# Patient Record
Sex: Male | Born: 1935 | Race: White | Hispanic: No | Marital: Married | Smoking: Former smoker
Health system: Southern US, Community
[De-identification: ages and names within clinical notes are randomized; demographics above are authoritative.]

## PROBLEM LIST (undated history)

## (undated) ENCOUNTER — Emergency Department (HOSPITAL_BASED_OUTPATIENT_CLINIC_OR_DEPARTMENT_OTHER): Admission: EM | Payer: Medicare Other | Source: Home / Self Care

## (undated) DIAGNOSIS — I272 Pulmonary hypertension, unspecified: Secondary | ICD-10-CM

## (undated) DIAGNOSIS — W44F3XA Food entering into or through a natural orifice, initial encounter: Secondary | ICD-10-CM

## (undated) DIAGNOSIS — I10 Essential (primary) hypertension: Secondary | ICD-10-CM

## (undated) DIAGNOSIS — K222 Esophageal obstruction: Secondary | ICD-10-CM

## (undated) DIAGNOSIS — I639 Cerebral infarction, unspecified: Secondary | ICD-10-CM

## (undated) DIAGNOSIS — I482 Chronic atrial fibrillation, unspecified: Secondary | ICD-10-CM

## (undated) DIAGNOSIS — T18128A Food in esophagus causing other injury, initial encounter: Secondary | ICD-10-CM

## (undated) DIAGNOSIS — I255 Ischemic cardiomyopathy: Secondary | ICD-10-CM

## (undated) DIAGNOSIS — J9600 Acute respiratory failure, unspecified whether with hypoxia or hypercapnia: Secondary | ICD-10-CM

## (undated) DIAGNOSIS — E785 Hyperlipidemia, unspecified: Secondary | ICD-10-CM

## (undated) DIAGNOSIS — I2510 Atherosclerotic heart disease of native coronary artery without angina pectoris: Secondary | ICD-10-CM

## (undated) DIAGNOSIS — R55 Syncope and collapse: Secondary | ICD-10-CM

## (undated) DIAGNOSIS — K219 Gastro-esophageal reflux disease without esophagitis: Secondary | ICD-10-CM

## (undated) DIAGNOSIS — I5022 Chronic systolic (congestive) heart failure: Secondary | ICD-10-CM

## (undated) DIAGNOSIS — D735 Infarction of spleen: Secondary | ICD-10-CM

## (undated) DIAGNOSIS — N200 Calculus of kidney: Secondary | ICD-10-CM

## (undated) DIAGNOSIS — M1990 Unspecified osteoarthritis, unspecified site: Secondary | ICD-10-CM

## (undated) DIAGNOSIS — R011 Cardiac murmur, unspecified: Secondary | ICD-10-CM

## (undated) DIAGNOSIS — K3533 Acute appendicitis with perforation and localized peritonitis, with abscess: Secondary | ICD-10-CM

## (undated) DIAGNOSIS — I495 Sick sinus syndrome: Secondary | ICD-10-CM

## (undated) DIAGNOSIS — N280 Ischemia and infarction of kidney: Secondary | ICD-10-CM

## (undated) HISTORY — DX: Essential (primary) hypertension: I10

## (undated) HISTORY — DX: Cardiac murmur, unspecified: R01.1

## (undated) HISTORY — DX: Sick sinus syndrome: I49.5

## (undated) HISTORY — DX: Chronic atrial fibrillation, unspecified: I48.20

## (undated) HISTORY — DX: Ischemia and infarction of kidney: N28.0

## (undated) HISTORY — DX: Chronic systolic (congestive) heart failure: I50.22

## (undated) HISTORY — DX: Unspecified osteoarthritis, unspecified site: M19.90

## (undated) HISTORY — DX: Food in esophagus causing other injury, initial encounter: T18.128A

## (undated) HISTORY — DX: Food entering into or through a natural orifice, initial encounter: W44.F3XA

## (undated) HISTORY — PX: LITHOTRIPSY: SUR834

## (undated) HISTORY — DX: Ischemic cardiomyopathy: I25.5

## (undated) HISTORY — DX: Hyperlipidemia, unspecified: E78.5

## (undated) HISTORY — DX: Pulmonary hypertension, unspecified: I27.20

---

## 1997-08-16 ENCOUNTER — Ambulatory Visit (HOSPITAL_COMMUNITY): Admission: RE | Admit: 1997-08-16 | Discharge: 1997-08-16 | Payer: Self-pay | Admitting: Urology

## 2001-03-16 DIAGNOSIS — I2510 Atherosclerotic heart disease of native coronary artery without angina pectoris: Secondary | ICD-10-CM

## 2001-03-16 HISTORY — DX: Atherosclerotic heart disease of native coronary artery without angina pectoris: I25.10

## 2002-02-23 ENCOUNTER — Inpatient Hospital Stay (HOSPITAL_COMMUNITY): Admission: AD | Admit: 2002-02-23 | Discharge: 2002-03-06 | Payer: Self-pay | Admitting: Cardiology

## 2002-02-24 ENCOUNTER — Encounter: Payer: Self-pay | Admitting: Cardiology

## 2002-02-24 HISTORY — PX: CARDIAC CATHETERIZATION: SHX172

## 2002-02-27 ENCOUNTER — Encounter: Payer: Self-pay | Admitting: Cardiology

## 2002-02-28 ENCOUNTER — Encounter (INDEPENDENT_AMBULATORY_CARE_PROVIDER_SITE_OTHER): Payer: Self-pay | Admitting: Cardiovascular Disease

## 2002-03-01 ENCOUNTER — Encounter: Payer: Self-pay | Admitting: Cardiothoracic Surgery

## 2002-03-01 HISTORY — PX: CORONARY ARTERY BYPASS GRAFT: SHX141

## 2002-03-02 ENCOUNTER — Encounter: Payer: Self-pay | Admitting: Cardiothoracic Surgery

## 2002-03-03 ENCOUNTER — Encounter: Payer: Self-pay | Admitting: Cardiothoracic Surgery

## 2002-03-04 ENCOUNTER — Encounter: Payer: Self-pay | Admitting: Cardiothoracic Surgery

## 2002-03-05 ENCOUNTER — Encounter: Payer: Self-pay | Admitting: Thoracic Surgery (Cardiothoracic Vascular Surgery)

## 2002-03-15 ENCOUNTER — Emergency Department (HOSPITAL_COMMUNITY): Admission: EM | Admit: 2002-03-15 | Discharge: 2002-03-15 | Payer: Self-pay

## 2002-03-30 ENCOUNTER — Encounter
Admission: RE | Admit: 2002-03-30 | Discharge: 2002-03-30 | Payer: Self-pay | Admitting: Thoracic Surgery (Cardiothoracic Vascular Surgery)

## 2002-03-30 ENCOUNTER — Encounter: Payer: Self-pay | Admitting: Thoracic Surgery (Cardiothoracic Vascular Surgery)

## 2002-04-24 ENCOUNTER — Ambulatory Visit (HOSPITAL_COMMUNITY): Admission: RE | Admit: 2002-04-24 | Discharge: 2002-04-24 | Payer: Self-pay | Admitting: Cardiology

## 2002-08-17 ENCOUNTER — Emergency Department (HOSPITAL_COMMUNITY): Admission: EM | Admit: 2002-08-17 | Discharge: 2002-08-18 | Payer: Self-pay | Admitting: Emergency Medicine

## 2002-08-18 ENCOUNTER — Encounter: Payer: Self-pay | Admitting: Emergency Medicine

## 2004-02-17 ENCOUNTER — Inpatient Hospital Stay (HOSPITAL_COMMUNITY): Admission: EM | Admit: 2004-02-17 | Discharge: 2004-02-20 | Payer: Self-pay | Admitting: Emergency Medicine

## 2004-02-17 ENCOUNTER — Ambulatory Visit: Payer: Self-pay | Admitting: Physical Medicine & Rehabilitation

## 2004-02-18 ENCOUNTER — Encounter (INDEPENDENT_AMBULATORY_CARE_PROVIDER_SITE_OTHER): Payer: Self-pay | Admitting: *Deleted

## 2004-02-20 ENCOUNTER — Inpatient Hospital Stay (HOSPITAL_COMMUNITY)
Admission: RE | Admit: 2004-02-20 | Discharge: 2004-02-25 | Payer: Self-pay | Admitting: Physical Medicine & Rehabilitation

## 2004-04-15 ENCOUNTER — Encounter
Admission: RE | Admit: 2004-04-15 | Discharge: 2004-07-14 | Payer: Self-pay | Admitting: Physical Medicine & Rehabilitation

## 2004-04-17 ENCOUNTER — Ambulatory Visit: Payer: Self-pay | Admitting: Physical Medicine & Rehabilitation

## 2005-03-16 DIAGNOSIS — I639 Cerebral infarction, unspecified: Secondary | ICD-10-CM

## 2005-03-16 HISTORY — DX: Cerebral infarction, unspecified: I63.9

## 2006-07-04 ENCOUNTER — Inpatient Hospital Stay (HOSPITAL_COMMUNITY): Admission: EM | Admit: 2006-07-04 | Discharge: 2006-07-06 | Payer: Self-pay | Admitting: Emergency Medicine

## 2006-07-05 HISTORY — PX: CORONARY ANGIOPLASTY: SHX604

## 2006-09-04 ENCOUNTER — Emergency Department (HOSPITAL_COMMUNITY): Admission: EM | Admit: 2006-09-04 | Discharge: 2006-09-04 | Payer: Self-pay | Admitting: Emergency Medicine

## 2006-09-13 ENCOUNTER — Ambulatory Visit: Payer: Self-pay | Admitting: Internal Medicine

## 2008-11-27 ENCOUNTER — Inpatient Hospital Stay (HOSPITAL_COMMUNITY): Admission: EM | Admit: 2008-11-27 | Discharge: 2008-11-29 | Payer: Self-pay | Admitting: Emergency Medicine

## 2009-05-09 ENCOUNTER — Inpatient Hospital Stay (HOSPITAL_COMMUNITY): Admission: EM | Admit: 2009-05-09 | Discharge: 2009-05-10 | Payer: Self-pay | Admitting: Emergency Medicine

## 2009-05-14 HISTORY — PX: NM MYOCAR PERF WALL MOTION: HXRAD629

## 2010-06-04 LAB — POCT I-STAT, CHEM 8
BUN: 15 mg/dL (ref 6–23)
Calcium, Ion: 1.12 mmol/L (ref 1.12–1.32)
Chloride: 101 mEq/L (ref 96–112)
Creatinine, Ser: 1 mg/dL (ref 0.4–1.5)
Glucose, Bld: 111 mg/dL — ABNORMAL HIGH (ref 70–99)
HCT: 42 % (ref 39.0–52.0)
Hemoglobin: 14.3 g/dL (ref 13.0–17.0)
Potassium: 4.1 mEq/L (ref 3.5–5.1)
Sodium: 138 mEq/L (ref 135–145)
TCO2: 34 mmol/L (ref 0–100)

## 2010-06-04 LAB — HEPATIC FUNCTION PANEL
AST: 19 U/L (ref 0–37)
Albumin: 3 g/dL — ABNORMAL LOW (ref 3.5–5.2)
Bilirubin, Direct: 0.2 mg/dL (ref 0.0–0.3)
Total Protein: 6.1 g/dL (ref 6.0–8.3)

## 2010-06-04 LAB — MAGNESIUM: Magnesium: 1.9 mg/dL (ref 1.5–2.5)

## 2010-06-04 LAB — POCT CARDIAC MARKERS
CKMB, poc: <1 ng/mL — ABNORMAL LOW (ref 1.0–8.0)
Myoglobin, poc: 79.2 ng/mL (ref 12–200)
Troponin i, poc: <0.05 ng/mL (ref 0.00–0.09)

## 2010-06-04 LAB — PROTIME-INR
INR: 1.06 (ref 0.00–1.49)
Prothrombin Time: 13.7 seconds (ref 11.6–15.2)

## 2010-06-04 LAB — CK TOTAL AND CKMB (NOT AT ARMC)
CK, MB: 1.3 ng/mL (ref 0.3–4.0)
Relative Index: INVALID (ref 0.0–2.5)
Total CK: 32 U/L (ref 7–232)

## 2010-06-04 LAB — APTT: aPTT: 52 seconds — ABNORMAL HIGH (ref 24–37)

## 2010-06-04 LAB — TROPONIN I: Troponin I: 0.03 ng/mL (ref 0.00–0.06)

## 2010-06-04 LAB — HEMOGLOBIN A1C
Hgb A1c MFr Bld: 6 % (ref 4.6–6.1)
Mean Plasma Glucose: 126 mg/dL

## 2010-06-04 LAB — CARDIAC PANEL(CRET KIN+CKTOT+MB+TROPI)
CK, MB: 1.3 ng/mL (ref 0.3–4.0)
Relative Index: INVALID (ref 0.0–2.5)
Total CK: 30 U/L (ref 7–232)

## 2010-06-04 LAB — TSH: TSH: 3.469 u[IU]/mL (ref 0.350–4.500)

## 2010-06-04 LAB — BRAIN NATRIURETIC PEPTIDE: Pro B Natriuretic peptide (BNP): 375 pg/mL — ABNORMAL HIGH (ref 0.0–100.0)

## 2010-06-20 LAB — BASIC METABOLIC PANEL
CO2: 33 mEq/L — ABNORMAL HIGH (ref 19–32)
Calcium: 8.9 mg/dL (ref 8.4–10.5)
Chloride: 100 mEq/L (ref 96–112)
Creatinine, Ser: 1.2 mg/dL (ref 0.4–1.5)
GFR calc Af Amer: >60 mL/min (ref >60)
GFR calc non Af Amer: >60 mL/min (ref >60)
Glucose, Bld: 97 mg/dL (ref 70–99)
Sodium: 136 mEq/L (ref 135–145)

## 2010-06-20 LAB — BRAIN NATRIURETIC PEPTIDE: Pro B Natriuretic peptide (BNP): 1027 pg/mL — ABNORMAL HIGH (ref 0.0–100.0)

## 2010-06-20 LAB — LIPID PANEL
HDL: 39 mg/dL — ABNORMAL LOW (ref >39)
LDL Cholesterol: 154 mg/dL — ABNORMAL HIGH (ref 0–99)
Total CHOL/HDL Ratio: 5.3 RATIO
VLDL: 15 mg/dL (ref 0–40)

## 2010-06-20 LAB — CBC
HCT: 36.4 % — ABNORMAL LOW (ref 39.0–52.0)
Hemoglobin: 12.5 g/dL — ABNORMAL LOW (ref 13.0–17.0)
Platelets: 232 10*3/uL (ref 150–400)
Platelets: 218 10*3/uL (ref 150–400)
RDW: 14.6 % (ref 11.5–15.5)
WBC: 7.7 10*3/uL (ref 4.0–10.5)
WBC: 8.8 10*3/uL (ref 4.0–10.5)

## 2010-06-20 LAB — DIFFERENTIAL
Eosinophils Relative: 1 % (ref 0–5)
Lymphocytes Relative: 13 % (ref 12–46)
Lymphs Abs: 1.2 10*3/uL (ref 0.7–4.0)
Monocytes Absolute: 0.7 10*3/uL (ref 0.1–1.0)

## 2010-06-20 LAB — COMPREHENSIVE METABOLIC PANEL
AST: 18 U/L (ref 0–37)
Albumin: 3.2 g/dL — ABNORMAL LOW (ref 3.5–5.2)
Calcium: 8.3 mg/dL — ABNORMAL LOW (ref 8.4–10.5)
Creatinine, Ser: 0.8 mg/dL (ref 0.4–1.5)
GFR calc Af Amer: >60 mL/min (ref >60)

## 2010-06-20 LAB — CK TOTAL AND CKMB (NOT AT ARMC)
CK, MB: 1.6 ng/mL (ref 0.3–4.0)
Relative Index: INVALID (ref 0.0–2.5)
Relative Index: INVALID (ref 0.0–2.5)
Total CK: 54 U/L (ref 7–232)

## 2010-06-20 LAB — MAGNESIUM: Magnesium: 2 mg/dL (ref 1.5–2.5)

## 2010-06-20 LAB — TROPONIN I: Troponin I: 0.03 ng/mL (ref 0.00–0.06)

## 2010-08-01 NOTE — Discharge Summary (Signed)
[name], [name] NO.:  [id number]   MEDICAL RECORD NO.:  [id number]          PATIENT TYPE:  IPS   LOCATION:  [id number]                         FACILITY:  [facility]   PHYSICIAN:  Gregory [name], M.D.   DATE OF BIRTH:  06-06-1932   DATE OF ADMISSION:  02/20/2004  DATE OF DISCHARGE:  02/25/2004                                 DISCHARGE SUMMARY   DISCHARGE DIAGNOSES:  1.  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  2.  Restless leg syndrome.  3.  History of atrial fibrillation.  4.  History of congestive heart failure.  5.  History of hypertension.  6.  History of elevated cholesterol.  7.  Urinary tract infection.  8.  History of hypertension.   HISTORY AND PHYSICAL:  The patient is a 77 year old right-handed while male  with past medical history of atrial fibrillation, chronic Coumadin, obesity,  admitted in [place] on February 16, 2004 after speech changes and left-  sided weakness.  Head CT revealed no acute intracranial abnormalities.  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  Coumadin for CVA prophylaxis.  INR on admission was 1.3.  PT report at this  time indicates patient is ambulating -50 feet, min amount assist.  Transfer  min assist.  _____.  Echocardiogram revealed the patient had an ejection  fraction of 30%, diffuse ventricular hypokinesis.  Carotid Dopplers did not  detect evidence of ICA stenosis.  The patient is transferred to [facility] Department on February 20, 2004.   REVIEW OF SYSTEMS:  Negative for shortness of breath, chest pain, weakness  and anxiety.   PAST HISTORY:  Significant for mitral valve regurgitation, atrial  fibrillation, cardiovascular disease, CHF, elevated lipids, severe restless  syndrome, renal calculi, GERD, hypertension.   PAST SURGICAL HISTORY:  CABG.   SOCIAL HISTORY:  The patient lives with wife in one level home in  [place], [place].  Two to  three step entry.  Supportive family,  eight children.  Remote smoker.  Denies any alcohol.  He is a Horticulturist, commercial.   MEDS ON ADMISSION:  1.  Zetia 10 mg daily.  2. Protonix 40 mg daily.  3. Altace 5 mg p.o. q.h.s.      4. Coreg 12.5 mg p.o. b.i.d.  5. Coumadin 5  mg Monday, Wednesday, Friday, 2.5 mg every other day.   ALLERGIES:  None.   HOSPITAL COURSE:  Problem #1. Gregory [name] was admitted to the _____  department on February 20, 2004.  _____ more than three hours of therapy  daily.  Overall, [name] progressed very well from a physical therapy  standpoint.  Discharged at _____ level.  The patient remained on Coumadin  for DVT and CVA prophylaxis.  The patient had no new neurologic event  occurred while in rehab.  The patient occasionally complained of muscle  aches in his shoulder and restless leg syndrome.  The patient was started on  Mirapex 0.25 mg p.o. q.h.s. for restless leg syndrome.  From a rehab  standpoint, the patient was able  to cooperate very well and discharged at  _____ level.   Problem #2. Elevation of cholesterol.  Remained on Zetia for patient's  cholesterol.  Liver function test remains stable.   Problem #3. Chronic atrial fibrillation.  Presently patient's heart rate was  controlled but irregular.  The patient is presently on no medicines.   Problem #4. Urinary tract infection.  At time of admission the patient had  urine culture performed on February 21, 2004 which demonstrated greater than  100,000 colonies Citrobacter koseri.  He was started on Macrobid 100 mg p.o.  b.i.d. for a total of 5 days.   Problem #5. Restless leg syndrome.  The patient remained on Mirapex 0.25 mg  p.o. q.h.s.   Problem #6. History of hypertension.  Blood pressure remained in good  control while in rehab.  He continued to take Altace 5 mg daily, Coreg 12.5  mg daily, and Lasix 20 mg daily.   Problem #7.  Muscle aches.  The patient received _____ as needed for right   shoulder and Tylenol as needed for pain.   No other major issues occurred while patient was in rehab.  The patient is  ambulating approximately supervision level 100 feet with SVQC transfers,  _____ he will be able to perform most ADLs, with minimal assist supervision  level.  Comprehension within functional limits.  The patient is able to  perform most ADLs with supervision level.  The patient is discharged home to  his family, discharge.   LATEST LABS:  Hemoglobin 14.2, hematocrit 41.1, white blood cell count 13.4,  platelet count 215.  Sodium 136, potassium 3.4, chloride 97, BUN 16,  creatinine 1.1. AST 25, alkaline phosphatase 71.  Urinalysis performed on  12/08 demonstrate many bacteria and moderate leukocytes.  The patient will  have _____ and CBC performed before he was discharged.  It is pending at the  time of this dictation.   Note at time of discharge all vitals were stable.  The patient was  discharged home to his family.   DISCHARGE MEDICATIONS:  1.  Mirapex 0.25 mg p.o. q.h.s.  2. Maxzide one tablet p.o. daily.  3.      Altace 5 mg daily.  4. Zetia 10 mg daily.  5. Coreg 12.5 mg daily.      Protonix 40 mg daily.  7. Lasix 20 mg daily.  8. Zocor 40 mg daily.  9.      Coumadin 5 mg except for 2.5 mg.  10. Trazodone 25-50 mg p.o. q.h.s.      p.r.n.  11. Multivitamin p.o. daily.   ACTIVITY:  No driving, no using alcohol, use cane.  No smoking.  _____  April 17, 2004 at 12:00 p.m.  Call Dr. [name] for this week.  Home health  with PT and OT and Coumadin results to Dr. [name].       LB/MEDQ  D:  02/25/2004  T:  02/25/2004  Job:  [id number]   cc:   [name]. [name], [place] [address]  [place] [address]  Fax: [phone]

## 2010-08-01 NOTE — Discharge Summary (Signed)
NAME:  Gregory [name], Gregory [name] NO.:  [id number]   MEDICAL RECORD NO.:  [id number]          PATIENT TYPE:  INP   LOCATION:  3703                         FACILITY:  [facility]   PHYSICIAN:  [name]. [name], MD       DATE OF BIRTH:  [birth year]   DATE OF ADMISSION:  07/04/2006  DATE OF DISCHARGE:  07/06/2006                               DISCHARGE SUMMARY   DISCHARGE DIAGNOSES:  1. Chest pain, probably non-cardiac in origin with patent graft by      catheterization this admission.  2. Coronary artery bypass grafting in December of 2003.  3. Chronic atrial fibrillation.  Not on Coumadin prior to admission.  4. Dyslipidemia.  5. Cardiomyopathy by catheterization this admission with an EF of 25%.   HOSPITAL COURSE:  The patient is a 77 year old male followed by Dr.  [name] in the past.  He had bypass surgery in December of 2003.  He had  atrial fibrillation which is now chronic.  He is not on Coumadin.  Prior  to admission, his only medicines were aspirin p.r.n.  The patient was  admitted to telemetry after presenting July 04, 2006 with shoulder pain  and chest pain.  The patient did agree to resuming some medications, he  preferred to do it one at a time since he has had problems with other  medicines side effects in the past.  We started him on Vytorin 10/20 on  admission.  He was cathed July 05, 2006 by Dr. Tami Ribas.  Catheterization revealed a patent vein graft to his PDA, patent SVG to  the OM, and a patent LIMA to the LAD, with an EF of 20 to 25%.  ACE  inhibitor and Lanoxin were added, as well as Coreg and aspirin.  He will  follow up with Dr. [name] as an outpatient.   DISCHARGE MEDICATIONS:  1. Lotensin 2.5 mg a day.  2. Lanoxin 0.125 mg a day.  3. Coreg 3.125 b.i.d.  4. Vytorin 10/20 every day.  5. Coated aspirin every day.  6. Nitroglycerin sublingual p.r.n.   LABORATORY DATA:  White count 7.1, hemoglobin 15.2, hematocrit 44.2,  platelets 211,000.  Sodium 137,  potassium 4.5, BUN 11, creatinine 1.  LFTs are normal.  CK and troponins were negative.  Cholesterol was 235  with a LDL of 168, HDL 55.  TSH 2.49.  Chest x-ray showed cardiomegaly,  low volumes, no edema.  UA was negative.  INR is 1.   DISPOSITION:  The patient is discharged in stable condition and will  follow up with Dr. [name].   ADDENDUM TO DISCHARGE SUMMARY:  EKG shows atrial fibrillation with  controlled ventricular response and poor anterior R wave progression.      Erlene Quan, P.A.      [name]. [name], MD  Electronically Signed    LKK/MEDQ  D:  07/06/2006  T:  07/06/2006  Job:  [id number]

## 2010-08-01 NOTE — Op Note (Signed)
NAME:  Gregory [name], Gregory [name]                         ACCOUNT NO.:  [id number]   MEDICAL RECORD NO.:  [id number]                   PATIENT TYPE:  INP   LOCATION:  2306                                 FACILITY:  [facility]   PHYSICIAN:  Lilia Argue. Servando Snare, M.D.            DATE OF BIRTH:  02-06-1933   DATE OF PROCEDURE:  03/01/2002  DATE OF DISCHARGE:                                 OPERATIVE REPORT   PREOPERATIVE DIAGNOSIS:  Coronary occlusive disease with severe left  ventricular dysfunction.   POSTOPERATIVE DIAGNOSIS:  Coronary occlusive disease with severe left  ventricular dysfunction.   PROCEDURES:  1. Coronary artery bypass grafting x4 with left internal mammary artery to     the left anterior descending coronary artery, reversed saphenous vein     graft to the obtuse marginal coronary artery, sequential reversed     saphenous vein graft to the posterior descending and posterolateral     branches of the right coronary artery, and ligation of the left atrial     appendage.  2. Right thigh endovein harvesting.   SURGEON:  Lilia Argue. Servando Snare, M.D.   ASSISTANTS:  Marcellus Scott, P.A.-C., and Sander Radon, M.D.   BRIEF HISTORY:  The patient is a 77 year old male who presents with a  history of coronary occlusive disease, having had angioplasty 10 years  prior.  He presented with new onset of congestive heart failure symptoms.  Evaluation with cardiac catheterization revealed significant three-vessel  coronary artery disease with total occlusion of the LAD, total occlusion of  the circumflex, an ostial right coronary lesion of 80%, and disease of the  posterior descending and posterolateral branches of 70-80%.  Ejection  fraction was 10-15% with mild pulmonary hypertension.  Echocardiogram showed  mild mitral regurgitation.  The patient after admission was noted to be in  atrial flutter of unknown duration.  In spite of increased risk of surgery  because of his poor LV function, it was felt  that bypass surgery provided  the best chance for the patient's overall improvement.  Risks and options  were discussed with him and his family in detail.   DESCRIPTION OF PROCEDURE:  With Swan-Ganz and arterial line monitors in  place, the patient underwent general endotracheal anesthesia without  incident.  A transesophageal echo probe was placed and observations noted  under separate dictation.  No definite left atrial clot was noted, but in  the very tip of the atrial appendage it was hard to discern definitely.  Overall global function was as noted preop markedly depressed.  There was  trivial to mild mitral regurgitation even with elevated filling pressures  and arterial pressure.  The skin of the chest and legs was prepped with  Betadine and draped in the usual sterile manner.  Using the [employer] endovein  harvesting system, segments of vein were harvested from the right thigh and  were of good quality and caliber.  Median sternotomy  was performed, and the  left internal mammary artery was dissected down as a pedicle graft.  The  distal artery was divided and had good, free flow.  The pericardium was  opened.  The patient had evidence of significant LV dysfunction, global  hypokinesis, and biventricular enlargement.  He was systemically  heparinized.  The ascending aorta and the right atrium were cannulated and  the aortic root vent cardioplegia needle was introduced into the ascending  aorta.  The patient was placed on cardiopulmonary bypass, 2.4 L/min. per sq.  m.  Sites of anastomosis were selected and dissected out of the epicardium.  The patient's body temperature was cooled to 30 degrees, the aortic  crossclamp was applied, and 500 cc of cold blood potassium cardioplegia was  administered with rapid diastolic arrest of the heart.  Myocardial septal  temperature was monitored throughout the crossclamp period.  Attention was  turned first to the obtuse marginal coronary artery,  which was opened.  It  was a small vessel but admitted a 1.5 mm probe.  Using a running 7-0  Prolene, distal anastomosis was performed.  Attention was then turned to the  posterior descending artery, which was opened and admitted a 1.5 mm probe.  Using a diamond-type, side-to-side anastomosis was carried out.  The distal  extent of the same vein was then carried onto the posterolateral branch of  the right coronary artery, which was slightly smaller but admitted a 1.5 mm  probe.  Using a running 7-0 Prolene, anastomosis was carried out.  Attention  was then carried to the left anterior descending coronary artery, which was  opened and was a reasonable-sized vessel, admitting a 1.5 mm probe distally.  Using a running 8-0 Prolene, the left internal mammary artery was  anastomosed to the left anterior descending coronary artery.  While the  crossclamp was on, the left atrial appendage was doubly ligated to decrease  the risk of postoperative clots.  Because of the patient's severe LV  dysfunction, it was decided not to perform a concomitant maze procedure.  The crossclamp was removed, crossclamp time of 76 minutes.  The partial  occlusion clamp was placed on the ascending aorta.  Two punch aortotomies  were performed and each of the two vein grafts were anastomosed to the  ascending aorta.  Air was evacuated from the grafts and the partial  occlusion clamp was removed.  The patient spontaneously converted to a sinus  rhythm without electrical conversion.  He was started on milrinone and  dopamine infusion.  With the body temperature rewarmed, he was then  ventilated and weaned from cardiopulmonary bypass without difficulty and  remained hemodynamically stable.  He was decannulated in the usual fashion.  Protamine sulfate was administered.  With the operative field hemostatic,  two atrial and two ventricular pacing wires were applied, graft markers were applied.  A left pleural tube and two  mediastinal tubes left in place.  The  sternum was closed with #6 stainless steel wire.  The fascia was closed with  interrupted 0 Vicryl, running 3-0 Vicryl in the subcutaneous tissue, and 4-0  subcuticular stitch in the skin.  Dry dressings were applied.  Sponge and  needle count was reported as correct at the completion of the procedure.  The patient tolerated the procedure without obvious complication and was  transferred to the surgical intensive care unit for further postoperative  care.  Lilia Argue Servando Snare, M.D.    EBG/MEDQ  D:  03/03/2002  T:  03/05/2002  Job:  [id number]   cc:   [name]. [name], M.D.  [address] [place]  [place] [address]  Fax: [phone]

## 2010-08-01 NOTE — Cardiovascular Report (Signed)
NAME:  Gregory [name], MAZZOTTI NO.:  [id number]   MEDICAL RECORD NO.:  [id number]          PATIENT TYPE:  INP   LOCATION:  3703                         FACILITY:  [facility]   PHYSICIAN:  Octavia Heir, MD  DATE OF BIRTH:  [birth year]   DATE OF PROCEDURE:  07/05/2006  DATE OF DISCHARGE:                            CARDIAC CATHETERIZATION   PROCEDURES:  1. Left heart catheterization.  2. Coronary angiography.  3. Left ventriculogram.  4. Left internal mammary angiography.  5. Saphenous vein graft.   COMPLICATIONS:  None.   INDICATION:  Mr. Flinchbaugh is a 77 year old male, patient of Dr. Adrian Prows,  with a history of chronic atrial fibrillation, history of ischemic  cardiomyopathy, history of cardiac catheterization in December 2003  subsequent to bypass surgery consisting of LIMA to LAD, vein graft to  OM, sequential vein graft to PD and posterolateral branch.  The patient  has been noncompliant with his medications.  He was admitted through the  ER on July 04, 2006 with reoccurring chest pain.  He is subsequently  ruled out for MI.  Because his symptoms were very similar to his  previous angiograms he is now brought for a cardiac catheterization to  reevaluated his grafts.   DESCRIPTION OF PROCEDURE:  After obtaining informed consent, the patient  was brought into the cardiac catheterization laboratory where the right  and left groin are shaved, prepped and draped in the usual sterile  fashion.  ECG monitoring was established.  Using modified Seldinger  technique, a #6 French arterial sheath was inserted in the femoral  artery.  The 6-French diagnostic catheter used to perform diagnostic  angiography.   The left main is a medium to large vessel with no significant disease.   The LAD is a medium sized vessel which has a 50% proximal lesion that is  subtotally occluded in its mid segment.  The mid distal LAD filled a  patent LIMA which inserts in the mid portion of the  LAD. There is no  significant disease in the IMA or distal IMA insertion.  This does  retrograde fill a large diagonal which has no significant disease.   The left circumflex is a small to medium sized vessel which is totally  occluded in its proximal segment up to confronting two small obtuse  marginal branch.   The first one is a small vessel with no significant disease.   The second one fills the patent saphenous vein graft.  The graft  insertion at mid portion of the limb.  There is no significant disease  in the body of the graft or the distal graft insertion.   The right coronary artery is a large vessel that is dominant consistent  with both PDA as well as posterolateral branch.  The RCA is narrowed at  60% proximal and 50% distal lesion.  There is a patent sequential vein  graft at PDA and posterolateral branch with no significant disease in  the graft or distal to the graft insertion.   Right ventriculogram:  There is a severe EF at 20-25% with global  hypokinesis.  There is mild mitral regurgitation.   HEMODYNAMIC SYSTEM:  Pressure 126/73, LV system pressure 124/11, LV to  PF 19.   CONCLUSION:  1. Significant 3-vessel coronary artery disease.  2. Patent LIMA to left anterior descending.  3. Patent vein graft up to the obtuse margin.  4. Patent sequential vein graft to posterior descending artery and      posterolateral branch.  5. Severe left ventricular systolic dysfunction.  6. Mild mitral regurgitation.  7. Elevated left ventricle end diastolic pressure.      Octavia Heir, MD  Electronically Signed     RHM/MEDQ  D:  07/05/2006  T:  07/05/2006  Job:  [id number]   cc:   [name]. [name], MD

## 2010-08-01 NOTE — Cardiovascular Report (Signed)
NAME:  Gregory [name], Gregory [name]                         ACCOUNT NO.:  [id number]   MEDICAL RECORD NO.:  [id number]                   PATIENT TYPE:  INP   LOCATION:  4703                                 FACILITY:  [facility]   PHYSICIAN:  [name]. [name], M.D.                  DATE OF BIRTH:  1932-11-27   DATE OF PROCEDURE:  02/24/2002  DATE OF DISCHARGE:                              CARDIAC CATHETERIZATION   PROCEDURES PERFORMED:  1. Left heart catheterization.  2. Left ventriculography.  3. Selective right and left coronary arteriography.  4. Right innominate arteriography including visualization of right internal     mammary artery and left subclavian arteriography including visualization     of left internal mammary artery.  5. Right heart catheterization.   INDICATION:  The patient is a 77 year old gentleman with a history of  coronary artery disease in the past with PTCA in 1988 who was admitted to  the hospital for acute systolic heart failure.  He also had marked  cardiomegaly.  A left and right heart catheterization has been performed  given his left ventricular systolic function, which was markedly reduced by  echocardiogram, and also his clinical presentation.   HEMODYNAMIC DATA:  Right heart data:  Right atrial pressures were 18/17 with a mean of 16 mmHg.  Right ventricular pressures are 44/13 with a mean of 15 mmHg.  The pulmonary  arterial pressures were 41/23 with a mean of 32 mmHg.  Pulmonary capillary  wedge was 44/47 with a mean of 38 mmHg.  There was giant V waves noted on  wedge.   The aortic saturation was 94%.  Pulmonary arterial saturation was 52%.   Cardiac output by Fick was 3.7 with a cardiac index of 1.7.   Left heart data:  The left ventricular pressures were 101/18 with an end-  diastolic pressure of 26 mmHg.  The aortic pressure 103/62 with a mean of 78  mmHg.  There was no pressure gradient across the aortic valve.   ANGIOGRAPHIC DATA:   LEFT VENTRICULOGRAM:   The left ventricular systolic function was markedly  reduced.  The left ventricle was dilated.  The ejection fraction was  estimated at 10-15%.  Mitral regurgitation could not be assessed as only  hand contrast injection was performed.   Right coronary artery:  The right coronary artery is a large caliber vessel.  It has mild disease throughout. The proximal segment has about 60-70%  stenosis.  The PDA is a moderate to large caliber vessel and the PLV is much  larger.  The PLV ostium has about 70% stenosis and the secondary branch of  the PLV has 80% stenosis.   Left main coronary artery:  Left main coronary artery is a large caliber  vessel.  It has mild calcification.   Circumflex coronary artery:  The circumflex coronary artery is occluded in  the proximal segment and there are  bridging collaterals noted.  There is  also right to left collaterals reconstituting the distal circumflex and  obtuse marginal.  The circumflex has mild diffuse disease and a very large  obtuse marginal arises from the mid circumflex coronary artery.   Left anterior descending artery:  Left anterior descending artery is a large  caliber vessel in the proximal segment.  It gives origin to a moderate sized  diagonal #1 which has a ostial 90-95% eccentric calcific stenosis.  Just  after the origin of diagonal #1, there is an 80% LAD stenosis.  The LAD is  occluded in the distal segment and is faintly filled by collaterals from the  right coronary artery.  There is also a very large diagonal #2, which arises  in the mid LAD region and acts like an optional LAD.  This is supplied by  collaterals from the right coronary artery.   Right innominate and left subclavian arteries are widely patent.  The RIMA  and LIMA are also widely patent.   FINAL INTERPRETATION:  1. Remarkable reduced left ventricular systolic function, ejection fraction     10-15% with a dilated left ventricle.  Markedly elevated left  ventricular     end-diastolic pressure.  2. Moderate pulmonary hypertension secondary to #1.  3. Markedly reduced cardiac output and cardiac index measured at 2.7 and     1.7, respectively by Fick method.  4. Giant V wave is noted on bridging suggestive of marked mitral valve     regurgitation.  5. Proximal 60-70% right coronary artery stenosis and a 70% posterolateral     ventricular ostial stenosis and an 80% secondary branch of posterolateral     ventricular stenosis.  Right coronary artery gives collaterals to the     distal left anterior descending artery and a very large diagonal #2,     which acts as an optional left anterior descending which is occluded in     the proximal segment.  The right coronary artery also gives collaterals     to the occluded circumflex coronary artery.   RECOMMENDATIONS:  1. Based on the angiographic data, the patient will benefit from coronary     artery bypass grafting.  However, prior to undergoing bypass surgery, the     patient needs to have a viability study.  The patient will be scheduled     for the same.  2. He also has atrial fibrillation, hence needs anticoagulation, but prior     to him being initiated on Coumadin, evaluation will be performed for     surgical revascularization.  Further recommendations to follow.   TECHNIQUE OF PROCEDURE:  Under the usual sterile precautions, using a 6  French right femoral artery access and an 8 French right femoral venous  access, left and right heart catheterization was performed.   TECHNIQUE OF RIGHT HEART CATHETERIZATION:  A Swan-Ganz catheter was easily  advanced to the venous sheath and pulmonary capillary wedge was easily  obtained.  Pulmonary arterial saturation was obtained.  Hemodynamic  monitoring of right heart pressures were performed in the usual fashion.  Then the catheter was pulled out of the body.   TECHNIQUE OF LEFT HEART CATHETERIZATION:  A 6 [place] multipurpose B2 catheter was  advanced to the ascending aorta over a 0.035 inch J wire.  The  catheter was gently advanced in the left ventricle and left ventricular  pressures were monitored.  Hand contrast injection in the left ventricle was  performed in the RAO  projection.  The catheter was flushed with saline,  pulled back in the ascending aorta and pressure gradient across the aortic  valve was monitored.  The right coronary artery was selectively engaged and  angiography was performed.  The catheter was then pulled back and the left  coronary artery was selectively engaged and angiography was  performed.  Then the catheter was pulled back into the arch of the aorta and  selective right innominate and left subclavian arteriography was performed.  Then the catheter was pulled out of the body.   The patient tolerated the procedure well and he was transferred to recovery  in a stable condition.                                                [name]. [name], M.D.    [name]  D:  02/24/2002  T:  02/25/2002  Job:  [id number]

## 2010-08-01 NOTE — H&P (Signed)
NAME:  Gregory [name], Gregory [name]                         ACCOUNT NO.:  [id number]   MEDICAL RECORD NO.:  [id number]                   [name] TYPE:  INP   LOCATION:  4703                                 FACILITY:  [facility]   PHYSICIAN:  [name]. [name], M.D.                  DATE OF BIRTH:  [birth year]   DATE OF ADMISSION:  02/23/2002  DATE OF DISCHARGE:                                HISTORY & PHYSICAL   [name] LOCATION:  Gregory [name] location unknown at this time but he will be  on telemetry at [place]:  Shortness of breath.   REASON FOR CONSULTATION:  Shortness of breath, abnormal chest x-ray, and  history of coronary artery disease.   HISTORY:  Gregory [name] is a 77 year old Caucasian male with history of  coronary artery disease status post PTCA in 1988 performed by Dr. Terance Ice has had no cardiac followup since then presents to [facility]  complaining of increasing shortness of breath and three-pillow orthopnea.  He stated that this has been going on for Gregory past 3 weeks.   Gregory [name] states that 3 weeks ago he had severe chest congestion and he  felt a lot of secretions and upper respiratory tract-like infection.  He was  coughing and hacking.  However, he presented today for increasing shortness  of breath.   He denies any chest pain.  He denies any palpitations.   He does complain of three-pillow orthopnea.  He does give history very  typical for paroxysmal nocturnal dyspnea.  He has also noticed increasing  lower extremity edema.   REVIEW OF SYSTEMS:  He denies any bowel or bladder dysfunction.  He denies  any neurological weakness.  He denies any palpitation.   Gregory [name] has not significantly gained weight in Gregory recent past.  Other  systems are negative.   PAST MEDICAL HISTORY:  Angina pectoris for which he has undergone PTCA in  1988.  No further cardiac followup.  No history of hypertension, no history  of diabetes, no followup with  any physicians in Gregory past since 1988.   PAST SURGICAL HISTORY:  None.   PRESENT MEDICATIONS:  None.   FAMILY HISTORY:  There is a history of premature coronary artery disease in  Gregory family.  His mother died at Gregory age of 67 with stroke.  Father died at  Gregory age of 22 with rheumatic fever.  He has two brothers who died at Gregory age  39 and another brother died at Gregory age of 64 - Gregory first brother with stroke  and second brother with myocardial infarction.   SOCIAL HISTORY:  He is married, lives with his wife, does not smoke, does  not drink alcohol.  He does chew tobacco.  There is no history of illicit  drug abuse.   PHYSICAL EXAMINATION:  GENERAL:  Well bred, obese, who appears  to be in no  acute distress  VITAL SIGNS:  Heart rate of 94 beats per minute, irregular; respirations are  16; blood pressure is 130/70 on Gregory right and 140/70 on Gregory left.  CARDIAC:  S1 is variable, S2 is normal.  There is a soft systolic murmur  heard in Gregory mitral area.  There is also an S3 gallop.  CHEST:  There are bilateral basilar crackles, left more than right,  occupying more than 50% of Gregory lung.  ABDOMEN:  Obese, bowel sounds heard in all four quadrants; no obvious  organomegaly.  EXTREMITIES:  There is 2+ edema in Gregory ankles.  NECK:  JVD was elevated.  PERIPHERAL:  All pulses 2+ and equal with no carotid, abdominal or femoral  artery bruit.   PERTINENT FINDINGS:  His ECG demonstrates _________ atrial fibrillation with  uncontrolled ventricular response.  There is ventricular complex noted.  Nonspecific ST-T wave changes.   Chest x-ray done at [facility] revealed marked cardiomegaly which appears to  be left ventricular dilatation.  There is bilateral venous congestion.   IMPRESSION:  1. Acute systolic heart failure with acute decompensation probably secondary     to underlying coronary artery disease.  Dilated nonischemic     cardiomyopathy cannot be completely excluded as __________ during  history     that he has flu 3 weeks ago.  2. New onset atrial fibrillation probably secondary to acute systolic heart     failure with acute decompensation, duration of which is unknown.  3. History of coronary artery disease status post percutaneous transluminal     coronary angioplasty in 1988, details of which are not known.  4. Unknown cholesterol status.  5. Family history of premature coronary artery disease.  6. Obesity.   RECOMMENDATIONS:  1. Based on his presentation with acute failure and marked cardiomegaly, we     will admit Gregory [name] to Gregory hospital.  We will gently diurese him and     schedule him for cardiac catheterization.  Gregory risks and benefits of     cardiac catheterization have been explained to Gregory [name].  This     includes less than 1% risk of death, stroke, heart attack, or need for     urgent bypass surgery with cath and 2-3% PCI.  Gregory [name] is willing to     proceed.  2. We will start Gregory [name] on IV heparin.  Before starting Coumadin     therapy, we will proceed with cardiac     catheterization.  An echocardiogram will also be obtained.  __________     and CPK's will also be obtained.  A BMP and BNP will also be obtained.   Further recommendations to follow.                                               [name]. [name], M.D.    [name]  D:  02/23/2002  T:  02/23/2002  Job:  [id number]   cc:   Rudene Re, P.A.-C.  [facility]  [address]  [place], [place] [address]   Lytle Butte, M.D.  [facility]  [address]  [place], [place] [address]   [place]

## 2010-08-01 NOTE — Assessment & Plan Note (Signed)
DATE OF SERVICE:  April 17, 2004.   MEDICAL RECORD NUMBER:  [id number].   DATE OF BIRTH:  11-19-1932.   [name] returns today.  He is a 77 year old, right-handed male with a  past medical history of atrial fibrillation on chronic Coumadin.  He had a  sudden onset of left-sided weakness on February 16, 2004.  MRI and MRA showed  a right subcortical CVA felt to be cardioembolic.  He was admitted to  rehabilitation on February 20, 2004, and was discharged on February 25, 2004.  He was discharged at a level of supervision 100 feet with a small based quad  cane.  He was minimal assistance to supervision level with ADLs.  His family  was able to care for him and they received family education prior to  discharge.  He did receive home health PT and OT.  They only came out once  for an assessment and discharged him based on his excellent recovery.   Subsequent to this he has followed up with Dr. [name] from cardiology.  He  has plans to follow up with C. Floyde Parkins, M.D.   The patient has resumed driving.  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   MEDICATIONS:  Zocor, Zetia, Lasix, Maxzide, Coumadin, Altace, Protonix,  Coreg, Mirapex and aspirin.  He is using the Mirapex for restless legs at  night only.   PHYSICAL EXAMINATION:  The blood pressure is 94/54, pulse 68 and O2  saturation 98 on room air.   Generally in no acute distress.  Mood and affect appropriate.  Speech  without dysarthria.  Visual fields intact.  Cranial nerves II-XII intact.  Motor strength is 5- in the left deltoids, biceps, triceps and grip, as well  as hip flexion, knee extension and ankle dorsiflexion and 5/5 on the right  side.  The deep tendon reflexes are overall depressed bilaterally.  He has  not evidence of increased tone on the left side.  His sensation is intact  bilaterally to pinprick in the upper extremities, as well as light touch.   Range of motion  is full in bilateral upper and lower extremities.  Gait is  without evidence of toe drag or knee instability.   IMPRESSION:  1.  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  His hemiparesis has nearly resolved to the point where it      is not really effecting him functionally.  2.  History of atrial fibrillation on chronic Coumadin.  3.  Coronary artery disease.  History of defibrillator implantation.   PLAN:  1.  I do not think he needs any further PT or OT at this time.  2.  I agree with his current level of activity.  He is remarkably active      given his medical comorbidities and age.  3.  He will follow up with cardiology in terms of his cardiac status and      with neurology in terms of stroke prophylaxis.      AEK/MedQ  D:  04/17/2004 14:17:13  T:  04/17/2004 16:39:35  Job #:  [id number]   cc:   Jill Alexanders, M.D.  [address] [place] [address]  Fax: [place]. [name], [place] [address] [place]  [place] [address]  Fax: [phone]

## 2010-08-01 NOTE — Discharge Summary (Signed)
NAME:  Gregory [name], Gregory [name]                         ACCOUNT NO.:  [id number]   MEDICAL RECORD NO.:  [id number]                   PATIENT TYPE:  INP   LOCATION:  2014                                 FACILITY:  [facility]   PHYSICIAN:  Lilia Argue. Servando Snare, M.D.            DATE OF BIRTH:  05-23-1932   DATE OF ADMISSION:  02/23/2002  DATE OF DISCHARGE:  03/06/2002                                 DISCHARGE SUMMARY   ADMISSION DIAGNOSIS:  New onset atrial fibrillation.   SECONDARY DIAGNOSIS:  Postoperative anemia secondary to blood loss.   DISCHARGE DIAGNOSIS:  Coronary artery disease.   HOSPITAL COURSE:  The patient was admitted to [facility] on February 23, 2002, secondary to new onset of atrial  fibrillation.  He was seen and evaluated by cardiology, who subsequently  performed cardiac catheterization which revealed significant coronary artery  disease.  Because of this, Edward B. Servando Snare, M.D., was consulted.  On  March 01, 2002, Dr. Servando Snare performed a coronary artery bypass graft x 4  with the left internal mammary artery anastomosed to the left anterior  descending artery, sequential saphenous vein graft to the posterior  descending and posterolateral artery, and saphenous vein graft to the  circumflex artery.  There were no complications noted during the procedure.  Postoperatively, the patient remained in atrial fibrillation.  Because of  this, he was started on amiodarone which adequately controlled his heart  rate.  Also, because of his atrial fibrillation, he was anticoagulated with  Coumadin.  His hemoglobin and hematocrit stabilized at 8.7 and 25.5,  respectively.  The BUN and creatinine were 28 and 1.2, respectively.  The  remainder of his postoperative course was uneventful.  He was subsequently  deemed stable for discharge home on March 06, 2002.   DISCHARGE MEDICATIONS:  1. Protonix 40 mg one daily.  2. Coreg 3.25 mg one twice daily.  3.  Amiodarone 200 mg one twice daily.  4. Aspirin 81 mg one daily.  5. Altace 1.25 mg one twice daily.  6. Coumadin 5 mg one daily or as directed by [name]. [name], M.D.  7. Ultram 50 mg one to two tablets every four to six hours as needed for     pain.   ACTIVITY:  The patient was told no driving or strenuous activity.  He was  told not to lift heavy objects.   DIET:  Low fat, low salt.   WOUND CARE:  The patient was told that he could shower and clean his  incisions with soap and water.   DISPOSITION:  Home.    FOLLOW-UP:  The patient was told to see [name]. [name], M.D., in two weeks.  He was also told to have his INR checked by Dr. [name] two days from  discharge.  In addition, he was told to see Percell Miller B. Servando Snare, M.D., at the  [place] office, at which time  the office will call and verify the time and date  of the appointment.  He was also told to go to the [facility] one hour before this appointment for a chest x-ray.     Mary Sella. Steward, P.A.                      Lilia Argue Servando Snare, M.D.    Franne Forts  D:  03/05/2002  T:  03/06/2002  Job:  [id number]

## 2010-08-01 NOTE — H&P (Signed)
NAME:  Gregory [name], [name] NO.:  [id number]   MEDICAL RECORD NO.:  [id number]          PATIENT TYPE:  EMS   LOCATION:  MAJO                         FACILITY:  [facility]   PHYSICIAN:  Jill Alexanders, M.D.  DATE OF BIRTH:  [birth year]   DATE OF ADMISSION:  02/16/2004  DATE OF DISCHARGE:                                HISTORY & PHYSICAL   HISTORY OF PRESENT ILLNESS:  Gregory [name] is a 77 year old right-handed  white male born 11/03/1932, who comes to [facility] at this  point with sudden onset of left hemiparesis that began around 6:50 p.m.  today. The patient was eating dinner and all of a sudden slumped in his  chair. The patient's wife was able to get him down on the ground. The  patient was noted to have some garbled speech. EMS was called and left  hemiparesis was noted. The patient has a history of atrial fibrillation and  is on Coumadin therapy.  The patient is to follow up with Dr. Einar [name] for his  atrial fibrillation. The patient was noted to have right gaze preference,  left hemiparesis, neglect of his left arm upon admission to the emergency  room. CT scan of the brain is unremarkable. The patient comes into the  hospital for further evaluation and management of right middle cerebral  distribution stroke.   PAST MEDICAL HISTORY:  1.  New onset right middle cerebral stroke.  2.  Coronary artery disease, status post CABG procedure times four.  3.  History of atrial fibrillation.  4.  History of mitral regurgitation.  5.  History of hypertension.  6.  History of hypercholesterolemia.  7.  History of renal calculi.  8.  History of obesity.  9.  Gastroesophageal reflux disease, possible esophageal stricture.  10. History of severe restless leg syndrome.   MEDICATIONS:  1.  Lasix 20 mg a day.  2.  Dyazide 37.5/25 mg daily.  3.  Altace 5 mg a day.  4.  Zetia 10 mg a day.  5.  Coreg 12.5 mg daily.  6.  Coumadin 5 mg a day.  7.  Protonix 40 mg a  day.   ALLERGIES:  The patient has no known allergies. Does not smoke or drink.   SOCIAL HISTORY:  The patient is married and lives in the [place], [place] area. He has limited education, barely literate, works as a Printmaker. The patient is actively working at this point.   FAMILY HISTORY:  Notable for the fact that mother died with senile dementia,  Alzheimer's type. Father died with rheumatic fever in his 39s. The patient  has 2 brothers that have passed away, one with MI and one with stroke. One  sister who is alive. Three brothers are alive and well. One brother had  gallbladder surgery. No family history of diabetes noted. The patient has  eight children who are alive and well. One child died of an accidental  death.   REVIEW OF SYSTEMS:  Notable for no fever or chills. The patient denies  headache. Denies any shortness  of breath, chest pain, abdominal pain. Has  occasional episodes of nausea and vomiting after eating. Denies any problems  controlling the bowel or bladder, blackout episodes, or seizures.   PHYSICAL EXAMINATION:  VITAL SIGNS: Blood pressure is 178/95, heart rate  106, respiratory rate 22, temperature afebrile.  GENERAL: The patient is a moderately obese white male who is alert and  cooperative at the time of examination.  HEENT: Head is atraumatic. Pupils are 2-3 mm, round, and reactive to light.  Disks are difficult to visualize.  NECK: Supple with no carotid bruits noted.  RESPIRATORY: Clear.  CARDIOVASCULAR: Regular rate and rhythm with no obvious murmurs or rubs  noted.  EXTREMITIES: Without significant edema.  NEUROLOGIC: Cranial nerves as above. Facial asymmetry is not present. The  patient has very prominent left facial weakness. The patient has a right  gaze preference. Can at times get the eyes just past midline to the left.  The patient initially reported some significant decreased pinprick sensation  on the left face, now this has  improved. The patient has dysarthria, no  aphasia. Motor testing reveals flaccid left upper extremity. The patient  occasionally is able to lift the left leg up off the bed, but will not do it  to command. The patient initially had significant neglect to the left upper  extremity which has now disappeared. The patient has initially absence of  pinprick, vibratory sensation, left arm, left leg as compared to the right,  but now this is present. The patient is able to perform finger-nose-finger  with the right arm, heel-to-shin with the right leg, cannot perform on the  left side. The patient cannot be ambulated. Deep tendon reflexes are present  throughout. Upgoing toes on the left, neutral on the right. The patient  again cannot be ambulated.   Laboratory values notable for a sodium of 137, potassium 3.9, chloride 109,  CO2 24, glucose 145, BUN 15, creatinine 1.2, calcium 8.6, total protein 6.8,  albumin 3.7, AST 23, ALT 19, alkaline phosphatase 59, total bilirubin of  0.5.  White count 6.7, hemoglobin 15.2, hematocrit 43.4, platelet count  264,000. INR 1.9. CK-MB fraction of 3.7, myoglobin of 108, troponin-I less  than 0.05.   EKG reveals atrial fibrillation. CT of the head is unremarkable. Chest x-ray  is pending.   IMPRESSION:  1.  New onset of right middle cerebral artery distribution stroke with left      hemiparesis, right-sided gaze preference. Some neglect on the left side.  2.  Atrial fibrillation.  3.  Coronary artery disease.  4.  Hypertension.  5.  Hyperlipidemia.   This patient has multiple risk factors for stroke. The patient likely  sustained a cardioembolic stroke to the right middle cerebral artery. The  patient seems to be appearing some since his time in the ER. The patient has  an INR of 1.9 and is not a candidate for IV or arterial TPA. The patient  will be admitted to the hospital and has agreed to participate in the [facility]-  2 trial.   PLAN: 1.  Admission  to [place] trial protocol.  3.  IV fluid hydration.  4.  IV heparin.  5.  Physical, occupational, and speech therapy.  6.  2-D echocardiogram.  7.  MRI of the brain.  8.  MRA.  9.  Follow patient's clinical course while in-house.  The patient will be      NPO for now.  CKW/MEDQ  D:  02/16/2004  T:  02/16/2004  Job:  [id number]   cc:   [name]. Gregory [name], [place] [address], [address] [address]  Fax: [place], MD  [address]  [place]  [place] [address]

## 2010-08-01 NOTE — H&P (Signed)
NAME:  Gregory [name], Gregory [name] NO.:  [id number]   MEDICAL RECORD NO.:  [id number]                   PATIENT TYPE:  INP   LOCATION:  2306                                 FACILITY:  [facility]   PHYSICIAN:  Nelda Severe. Tobias Alexander, M.D.               DATE OF BIRTH:  [birth year]   DATE OF ADMISSION:  02/23/2002  DATE OF DISCHARGE:                                HISTORY & PHYSICAL   PROCEDURE PERFORMED:  Transesophageal echocardiogram.   DIAGNOSIS:  1. Coronary artery disease.  2. Mitral regurgitation.   [name] is a 42-hear-old, white male who presents to the operating  room for coronary artery bypass grafting and possible mitral valve repair or  replacement.   The surgeon requested transesophageal echocardiogram to help with the  patient's intraoperative management.  [name] underwent a routine cardiac  induction and following this, the transesophageal probe was carefully  lubricated and inserted into the patient's esophagus for cardiac imaging.  Overall images of the heart demonstrated an enlarged heart with no evidence  of pericardial effusions.  The right atrium appeared to be normal in size  without evidence of masses.  There was no obvious ASD noted by Doppler.  The  tricuspid valve had 1+ regurgitation but appeared to be normal in structure.  The right ventricle had no evidence of segmental wall motion abnormalities.  The left atrium had obvious smoke.  The left atrial appendage appeared to  have a small thrombus or mass in the tip of the appendage.  The left atrium  was enlarged in size.  The mitral valve had calcification, but showed no  evidence of leaflet or prolapse.  There was mild mitral insufficiency with  the jet coming along the atrial septum.  Pulmonary vein studies showed some  blunting of the systolic peak, but no reversal of flow.  The left ventricle  had overall reduced contractility, in particularly, the lateral wall was  akinetic with  some contraction of the anterior and septal wall.  Overall  contractility appeared to be significantly reduced into the 20% range.  The  patient then underwent coronary artery bypass grafting, no intervention was  done to the mitral valve, and following his bypass grafting, the patient  separated from the bypass machine successfully.  Overall contractility of  the heart was improved following surgery, and there was slightly less mitral  regurgitation noted postop.  The transesophageal probe was then removed and  the patient was taken to the SICU.  It was noted that there was a grayish-  brown residue which we believe may have been from the transesophageal probe  across the patient's lip and this will be followed.  The patient tolerated  the procedure well.  Nelda Severe Tobias Alexander, M.D.    JDS/MEDQ  D:  03/01/2002  T:  03/02/2002  Job:  [id number]
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
## 2010-08-01 NOTE — Op Note (Signed)
NAME:  Gregory, [name]                         ACCOUNT NO.:  [id number]   MEDICAL RECORD NO.:  [id number]                   PATIENT TYPE:  AMB   LOCATION:  ENDO                                 FACILITY:  [facility]   PHYSICIAN:  Ulice Dash R. [name], M.D.                  DATE OF BIRTH:  02-12-1933   DATE OF PROCEDURE:  04/24/2002  DATE OF DISCHARGE:                                 OPERATIVE REPORT   REFERRING PHYSICIAN:  Marlane Mingle, M.D., [place].   PROCEDURE:  Transesophageal echocardiogram.   INDICATIONS FOR PROCEDURE:  The patient is a 77 year old gentleman with  history of coronary artery disease and severe left ventricular dysfunction,  who had undergone coronary artery bypass grafting and who has atrial  fibrillation. He was brought to the TEE suite to evaluate for atrial  flutter/fibrillation for possible electrical cardioversion.   DESCRIPTION OF PROCEDURE:  Under mild sedation using Versed and Demerol and  using local anesthetic spray, a [employer] OmniPlane probe was easily  introduced into the esophagus and TEE was performed.   RESULTS:  1. Left ventricle.  The left ventricle shows some mild to moderate left     ventricular dilatation. There is mild generalized hypokinesis with septal     hypokinesis.  Ejection fraction was estimated around 35%.  2. Left atrium. The left atrium shows marked left atrial enlargement.  3. Left atrial appendage. The left atrial appendage was well visualized.     There was clearly a visible thrombus which was mobile. This was small.     The clot probably measured around 1 mm.  This was attached to the lateral     aspect of the left atrial appendage.  4. Right atrium. The right atrium shows marked right atrial enlargement.  5. Right ventricle. The right ventricle is normal.  6. Pericardium. The pericardium is normal.  7. Mitral valve. The mitral valve is normal. There is mild mitral valve     regurgitation.  8. Tricuspid valve.  The tricuspid  valve is normal. There is mild tricuspid     valve regurgitation.  9. Aortic valve. The aortic valve shows mild focal thickening of non     coronary cusp.  There is trivial aortic valve regurgitation.  10.      Pulmonary valve. The pulmonary valve was not well visualized.  11.      Thoracic aorta. The thoracic aorta shows mild to moderate     prosthetic changes. There is mild calcification especially noted in the     ascending aorta. No complex plaques.   FINAL IMPRESSION:  1. Visible left atrial appendage plaque. This was highly mobile.  2. Marked left ventricular systolic dysfunction with mild to moderate left     ventricular dilatation. Generalized hypokinesis with septal hypokinesis,     ejection fraction 35%.  3. Mild mitral valve regurgitation, mild tricuspid valve regurgitation,  minimal aortic valve regurgitation.    RECOMMENDATIONS:  Based on his TEE, continued aspirin and Coumadin is  indicated. Cardioversion will not be attempted.                                               [name]. [name], M.D.    [name]  D:  04/24/2002  T:  04/24/2002  Job:  [id number]   cc:   [facility]

## 2010-08-01 NOTE — Discharge Summary (Signed)
NAMEBRION, PRUET NO.:  [id number]   MEDICAL RECORD NO.:  [id number]          PATIENT TYPE:  INP   LOCATION:  3004                         FACILITY:  [facility]   PHYSICIAN:  Burnetta Sabin, N.P.      DATE OF BIRTH:  Mar 03, 1933   DATE OF ADMISSION:  02/16/2004  DATE OF DISCHARGE:  02/20/2004                                 DISCHARGE SUMMARY   DIAGNOSES AT TIME OF DISCHARGE:  1.  Right brain subcortical infarct, cardioembolic, secondary to atrial      fibrillation.  2.  Chronic atrial fibrillation on Coumadin.  3.  Coronary artery disease, status post coronary artery bypass grafting in      2003.  4.  Ischemic cardiomyopathy with ejection fraction of 30% followed by      Cardiology, with plans for implantable defibrillator after recovery from      stroke.  5.  Mitral regurgitation.  6.  Hypertension.  7.  Hypercholesterolemia.  8.  History of renal calculi.  9.  Obesity.  10. Gastroesophageal reflux disease with possible esophageal stricture.  11. Severe restless leg syndrome.   MEDICINES AT TIME OF DISCHARGE:  1.  Mirapex 0.25 mg q.h.s.  2.  Maxzide 37.5/25 mg p.o. daily.  3.  Altace 5 mg daily.  4.  Zetia 10 mg daily.  5.  Coreg 12.5 mg daily.  6.  Protonix 40 mg daily.  7.  Lasix 20 mg daily.  8.  Coumadin 5 mg daily.   STUDIES PERFORMED:  1.  CT of the head on admission showed no acute intracranial abnormality,      atrophy.  2.  Chest x-ray showed cardiomegaly without congestive heart failure, low      lung volumes, and bibasilar atelectasis.  3.  Followup CT at 48 hours shows early, subacute, right MCA distribution      infarct with petechial-like hemorrhage in the right lentiform nucleus.  4.  CT of the head at 72 hours shows right middle cerebral artery infarct      with possible small __________hemorrhagic focus with no significant      change since day prior.  5.  MRI of the brain shows acute left posterior, temporal, parietal and  occipital lobe infarcts with partial hemorrhage in the right lentiform      nucleus with mild mass effect, subacute post-right coronal radiata      infarct.  No major vessel occlusion or stenosis.  6.  2-D echocardiogram shows ejection fraction of 25-50% with severe,      diffuse, left ventricular hypokinesis.  There is no obvious emboli      found.  7.  EKG shows atrial fibrillation with premature ventricular or aberrantly      conducted complexes that is new since previous tracing.  Cannot rule out      anterior infarct, age undetermined.  8.  Carotid Doppler shows no evidence of ICA stenosis, with mild ECA      stenosis.  Vertebral artery flow is antegrade.  9.  Transcranial Dopplers were performed, and results are pending at time of  discharge.   LABORATORY STUDIES:  INR day of discharge 2.7.  CBC:  White blood cells  increasing during hospitalization, 7.2 up to 11.3.  Hemoglobin stable.  Hematocrit stable.  MCHC, RDW, and platelets normal.  Initial differential  with neutrophils low at 40, monocytes high at 12, at or on admission 1.9.  Chemistry normal except for a glucose elevated at 145.  Liver function tests  normal.  Hemoglobin A1c normal.  Homocystine normal.  Cardiac enzymes  normal.  Cholesterol 223, triglycerides 66, HDL 41, and LDL 169.  Urine drug  screen was negative.  UA was negative.   HISTORY OF PRESENT ILLNESS:  Mr. Kitt Kerkhoff is a 77 year old, right-  handed, white male, who presented to the [facility] Emergency Room with  sudden onset left hemiparesis that began around 6:50 the night of admission.  He was eating dinner and all of a sudden slumped in his chair.  His wife was  able to get him down to the ground.  He was noted to have some garbled  speech, and EMS was called.  EMS noted an obvious left hemiparesis, and his  history of atrial fibrillation on Coumadin therapy.  CT of the brain in the  emergency room was unremarkable for acute infarct, and INR was  1.9.  He was  not eligible for TPA secondary to Coumadin use.  He was enrolled in the [place].  Jude's study, receiving the IV drug for three days, which is a free radical  scavenger, hoping to reduce the deficits from a stroke.  [facility] [employer]  is handling this.  He was admitted to the floor for further workup and for  CT.   HOSPITAL COURSE:  MRI did reveal an acute infarct thought to be  cardioembolic in nature secondary to atrial fibrillation.  The patient  remained on Coumadin during hospitalization with INRs therapeutic.  Cardiology was consulted as well as EP, with plans to place an ICD after  recovery of his stroke as an outpatient.  Medication adjustments were made  with ischemic cardiomyopathy with a low ejection fraction and chronic atrial  fibrillation.   PT, OT, and Speech all evaluated the patient and agreed he would be  beneficial to receive inpatient rehab for a short period.  The patient was  to be transferred there with continuation of therapies and discharge home  when able.   CONDITION AT DISCHARGE:  Patient alert and oriented x3.  Speech slightly  dysarthric with mild, left, facial weakness.  His visual fields are full.  His left upper extremities 4/5.  His left lower extremities 4/5.  His chest  was clear to auscultation.  Heart rhythm was regular.  His sensation is  intact.  His gait is unsteady.   DISCHARGE PLAN:  1.  Transfer to Rehab for continuation of therapies.  2.  Coumadin for secondary stroke prevention.  3.  Planned ICD after recovery of stroke by EP.  4.  Follow up with primary care physician within one month.  5.  Follow up with Dr. Leonie Man in two to three months after discharge.   DICTATED FOR:  Pramod P. Leonie Man, MD.       SB/MEDQ  D:  02/20/2004  T:  02/20/2004  Job:  [id number]   cc:   Pramod P. Leonie Man, MD  Fax: [phone]   Lanelle Bal, MD  [place]  [place] [address]   Jay R. [name], [place] [address]. [address], [address] [place]  [place] [address]  Fax: [phone]

## 2010-12-31 LAB — CLOTEST (H. PYLORI), BIOPSY: Helicobacter screen: NEGATIVE

## 2011-03-17 HISTORY — PX: CATARACT EXTRACTION W/ INTRAOCULAR LENS IMPLANT: SHX1309

## 2011-03-30 ENCOUNTER — Emergency Department (HOSPITAL_COMMUNITY): Payer: Medicare Other | Admitting: Certified Registered"

## 2011-03-30 ENCOUNTER — Encounter (HOSPITAL_COMMUNITY): Payer: Self-pay | Admitting: Emergency Medicine

## 2011-03-30 ENCOUNTER — Other Ambulatory Visit (INDEPENDENT_AMBULATORY_CARE_PROVIDER_SITE_OTHER): Payer: Self-pay | Admitting: Surgery

## 2011-03-30 ENCOUNTER — Encounter (HOSPITAL_COMMUNITY): Payer: Self-pay | Admitting: Certified Registered"

## 2011-03-30 ENCOUNTER — Emergency Department (HOSPITAL_COMMUNITY): Payer: Medicare Other

## 2011-03-30 ENCOUNTER — Inpatient Hospital Stay (HOSPITAL_COMMUNITY)
Admission: EM | Admit: 2011-03-30 | Discharge: 2011-04-03 | DRG: 340 | Disposition: A | Discharge status: Home Health | Payer: Medicare Other | Source: Ambulatory Visit | Attending: General Surgery | Admitting: General Surgery

## 2011-03-30 ENCOUNTER — Other Ambulatory Visit: Payer: Self-pay

## 2011-03-30 ENCOUNTER — Encounter (HOSPITAL_COMMUNITY)
Admission: EM | Disposition: A | Discharge status: Home Health | Payer: Self-pay | Source: Ambulatory Visit | Attending: Surgery

## 2011-03-30 DIAGNOSIS — Z87891 Personal history of nicotine dependence: Secondary | ICD-10-CM

## 2011-03-30 DIAGNOSIS — Z7902 Long term (current) use of antithrombotics/antiplatelets: Secondary | ICD-10-CM

## 2011-03-30 DIAGNOSIS — I658 Occlusion and stenosis of other precerebral arteries: Secondary | ICD-10-CM | POA: Diagnosis present

## 2011-03-30 DIAGNOSIS — D72829 Elevated white blood cell count, unspecified: Secondary | ICD-10-CM | POA: Diagnosis present

## 2011-03-30 DIAGNOSIS — I10 Essential (primary) hypertension: Secondary | ICD-10-CM | POA: Diagnosis present

## 2011-03-30 DIAGNOSIS — Z8673 Personal history of transient ischemic attack (TIA), and cerebral infarction without residual deficits: Secondary | ICD-10-CM

## 2011-03-30 DIAGNOSIS — Z91199 Patient's noncompliance with other medical treatment and regimen due to unspecified reason: Secondary | ICD-10-CM

## 2011-03-30 DIAGNOSIS — I482 Chronic atrial fibrillation, unspecified: Secondary | ICD-10-CM | POA: Diagnosis present

## 2011-03-30 DIAGNOSIS — I255 Ischemic cardiomyopathy: Secondary | ICD-10-CM | POA: Diagnosis present

## 2011-03-30 DIAGNOSIS — I2589 Other forms of chronic ischemic heart disease: Secondary | ICD-10-CM | POA: Diagnosis present

## 2011-03-30 DIAGNOSIS — I2510 Atherosclerotic heart disease of native coronary artery without angina pectoris: Secondary | ICD-10-CM | POA: Diagnosis present

## 2011-03-30 DIAGNOSIS — Z951 Presence of aortocoronary bypass graft: Secondary | ICD-10-CM

## 2011-03-30 DIAGNOSIS — I6529 Occlusion and stenosis of unspecified carotid artery: Secondary | ICD-10-CM | POA: Diagnosis present

## 2011-03-30 DIAGNOSIS — I4891 Unspecified atrial fibrillation: Secondary | ICD-10-CM | POA: Diagnosis present

## 2011-03-30 DIAGNOSIS — N200 Calculus of kidney: Secondary | ICD-10-CM

## 2011-03-30 DIAGNOSIS — I639 Cerebral infarction, unspecified: Secondary | ICD-10-CM

## 2011-03-30 DIAGNOSIS — K219 Gastro-esophageal reflux disease without esophagitis: Secondary | ICD-10-CM | POA: Diagnosis present

## 2011-03-30 DIAGNOSIS — K3533 Acute appendicitis with perforation and localized peritonitis, with abscess: Principal | ICD-10-CM | POA: Diagnosis present

## 2011-03-30 DIAGNOSIS — Z9119 Patient's noncompliance with other medical treatment and regimen: Secondary | ICD-10-CM

## 2011-03-30 DIAGNOSIS — N289 Disorder of kidney and ureter, unspecified: Secondary | ICD-10-CM | POA: Diagnosis not present

## 2011-03-30 HISTORY — DX: Atherosclerotic heart disease of native coronary artery without angina pectoris: I25.10

## 2011-03-30 HISTORY — DX: Calculus of kidney: N20.0

## 2011-03-30 HISTORY — PX: LAPAROSCOPIC APPENDECTOMY: SHX408

## 2011-03-30 HISTORY — DX: Cerebral infarction, unspecified: I63.9

## 2011-03-30 LAB — URINALYSIS, ROUTINE W REFLEX MICROSCOPIC
Nitrite: NEGATIVE
Protein, ur: 30 mg/dL — AB
Specific Gravity, Urine: 1.038 — ABNORMAL HIGH (ref 1.005–1.030)
Urobilinogen, UA: 1 mg/dL (ref 0.0–1.0)

## 2011-03-30 LAB — DIFFERENTIAL
Eosinophils Relative: 0 % (ref 0–5)
Lymphocytes Relative: 15 % (ref 12–46)
Lymphs Abs: 2.7 10*3/uL (ref 0.7–4.0)
Monocytes Absolute: 2.1 10*3/uL — ABNORMAL HIGH (ref 0.1–1.0)

## 2011-03-30 LAB — COMPREHENSIVE METABOLIC PANEL
ALT: 16 U/L (ref 0–53)
CO2: 28 mEq/L (ref 19–32)
Calcium: 9.6 mg/dL (ref 8.4–10.5)
Creatinine, Ser: 1.26 mg/dL (ref 0.50–1.35)
GFR calc Af Amer: 62 mL/min — ABNORMAL LOW (ref >90)
GFR calc non Af Amer: 54 mL/min — ABNORMAL LOW (ref >90)
Glucose, Bld: 104 mg/dL — ABNORMAL HIGH (ref 70–99)

## 2011-03-30 LAB — CBC
HCT: 44.6 % (ref 39.0–52.0)
Hemoglobin: 15.5 g/dL (ref 13.0–17.0)
MCV: 90.5 fL (ref 78.0–100.0)
RBC: 4.93 MIL/uL (ref 4.22–5.81)
WBC: 17.9 10*3/uL — ABNORMAL HIGH (ref 4.0–10.5)

## 2011-03-30 LAB — URINE MICROSCOPIC-ADD ON

## 2011-03-30 SURGERY — APPENDECTOMY, LAPAROSCOPIC
Anesthesia: General | Site: Abdomen | Wound class: Dirty or Infected

## 2011-03-30 MED ORDER — IOHEXOL 300 MG/ML  SOLN: 100.0000 mL | Freq: Once | INTRAMUSCULAR | Status: AC | PRN

## 2011-03-30 MED ORDER — LACTATED RINGERS IV SOLN
INTRAVENOUS | Status: DC | PRN
  Administered 2011-03-30 – 2011-03-31 (×2): via INTRAVENOUS

## 2011-03-30 MED ORDER — ONDANSETRON HCL 4 MG/2ML IJ SOLN
4.0000 mg | Freq: Once | INTRAMUSCULAR | Status: AC
  Administered 2011-03-30: 4 mg via INTRAVENOUS
  Filled 2011-03-30: qty 2

## 2011-03-30 MED ORDER — SODIUM CHLORIDE 0.9 % IR SOLN
Status: DC | PRN
  Administered 2011-03-30: 1000 mL

## 2011-03-30 MED ORDER — PROPOFOL 10 MG/ML IV EMUL
INTRAVENOUS | Status: DC | PRN
  Administered 2011-03-30: 100 mg via INTRAVENOUS

## 2011-03-30 MED ORDER — BUPIVACAINE-EPINEPHRINE 0.5% -1:200000 IJ SOLN
INTRAMUSCULAR | Status: DC | PRN
  Administered 2011-03-30: 10 mL

## 2011-03-30 MED ORDER — ESMOLOL HCL 10 MG/ML IV SOLN
INTRAVENOUS | Status: DC | PRN
  Administered 2011-03-30: 10 mg via INTRAVENOUS

## 2011-03-30 MED ORDER — MORPHINE SULFATE 4 MG/ML IJ SOLN
4.0000 mg | Freq: Once | INTRAMUSCULAR | Status: AC
  Administered 2011-03-30: 4 mg via INTRAVENOUS
  Filled 2011-03-30: qty 1

## 2011-03-30 MED ORDER — SODIUM CHLORIDE 0.9 % IV SOLN
1.0000 g | Freq: Once | INTRAVENOUS | Status: AC
  Administered 2011-03-30: 1 g via INTRAVENOUS
  Filled 2011-03-30: qty 1

## 2011-03-30 MED ORDER — SUFENTANIL CITRATE 50 MCG/ML IV SOLN
INTRAVENOUS | Status: DC | PRN
  Administered 2011-03-30: 10 ug via INTRAVENOUS
  Administered 2011-03-30: 20 ug via INTRAVENOUS

## 2011-03-30 MED ORDER — ROCURONIUM BROMIDE 100 MG/10ML IV SOLN
INTRAVENOUS | Status: DC | PRN
  Administered 2011-03-30: 35 mg via INTRAVENOUS

## 2011-03-30 SURGICAL SUPPLY — 44 items
ADH SKN CLS APL DERMABOND .7 (GAUZE/BANDAGES/DRESSINGS) ×1
APPLIER CLIP ROT 10 11.4 M/L (STAPLE) ×4
APR CLP MED LRG 11.4X10 (STAPLE) ×2
BAG SPEC RTRVL LRG 6X4 10 (ENDOMECHANICALS) ×1
BLADE SURG ROTATE 9660 (MISCELLANEOUS) ×1 IMPLANT
CANISTER SUCTION 2500CC (MISCELLANEOUS) ×2 IMPLANT
CHLORAPREP W/TINT 26ML (MISCELLANEOUS) ×2 IMPLANT
CLIP APPLIE ROT 10 11.4 M/L (STAPLE) IMPLANT
CLOTH BEACON ORANGE TIMEOUT ST (SAFETY) ×2 IMPLANT
COVER SURGICAL LIGHT HANDLE (MISCELLANEOUS) ×2 IMPLANT
CUTTER FLEX LINEAR 45M (STAPLE) ×2 IMPLANT
DERMABOND ADVANCED (GAUZE/BANDAGES/DRESSINGS) ×1
DERMABOND ADVANCED .7 DNX12 (GAUZE/BANDAGES/DRESSINGS) ×1 IMPLANT
DRAIN CHANNEL 19F RND (DRAIN) ×1 IMPLANT
DRAPE UTILITY 15X26 W/TAPE STR (DRAPE) ×4 IMPLANT
ELECT REM PT RETURN 9FT ADLT (ELECTROSURGICAL) ×2
ELECTRODE REM PT RTRN 9FT ADLT (ELECTROSURGICAL) ×1 IMPLANT
ENDOLOOP SUT PDS II  0 18 (SUTURE)
ENDOLOOP SUT PDS II 0 18 (SUTURE) IMPLANT
EVACUATOR SILICONE 100CC (DRAIN) ×1 IMPLANT
GLOVE BIO SURGEON STRL SZ8 (GLOVE) ×2 IMPLANT
GLOVE BIOGEL PI IND STRL 8 (GLOVE) ×1 IMPLANT
GLOVE BIOGEL PI INDICATOR 8 (GLOVE) ×1
GOWN STRL NON-REIN LRG LVL3 (GOWN DISPOSABLE) ×4 IMPLANT
GUIDEPIN 3.2X17.5 THRD DISP (PIN) IMPLANT
HEMOSTAT SNOW SURGICEL 2X4 (HEMOSTASIS) ×1 IMPLANT
KIT BASIN OR (CUSTOM PROCEDURE TRAY) ×2 IMPLANT
KIT ROOM TURNOVER OR (KITS) ×2 IMPLANT
NS IRRIG 1000ML POUR BTL (IV SOLUTION) ×2 IMPLANT
PAD ARMBOARD 7.5X6 YLW CONV (MISCELLANEOUS) ×4 IMPLANT
POUCH SPECIMEN RETRIEVAL 10MM (ENDOMECHANICALS) ×2 IMPLANT
RELOAD STAPLE 45 3.5 BLU ETS (ENDOMECHANICALS) ×1 IMPLANT
RELOAD STAPLE TA45 3.5 REG BLU (ENDOMECHANICALS) ×2 IMPLANT
SCALPEL HARMONIC ACE (MISCELLANEOUS) ×2 IMPLANT
SET IRRIG TUBING LAPAROSCOPIC (IRRIGATION / IRRIGATOR) ×2 IMPLANT
SPECIMEN JAR SMALL (MISCELLANEOUS) ×2 IMPLANT
SUT MON AB 4-0 PC3 18 (SUTURE) ×2 IMPLANT
TOWEL OR 17X24 6PK STRL BLUE (TOWEL DISPOSABLE) ×2 IMPLANT
TOWEL OR 17X26 10 PK STRL BLUE (TOWEL DISPOSABLE) ×2 IMPLANT
TRAY FOLEY CATH 14FR (SET/KITS/TRAYS/PACK) ×2 IMPLANT
TRAY LAPAROSCOPIC (CUSTOM PROCEDURE TRAY) ×2 IMPLANT
TROCAR XCEL BLADELESS 5X75MML (TROCAR) ×4 IMPLANT
TROCAR XCEL BLUNT TIP 100MML (ENDOMECHANICALS) ×2 IMPLANT
WATER STERILE IRR 1000ML POUR (IV SOLUTION) IMPLANT

## 2011-03-30 NOTE — ED Provider Notes (Signed)
History     CSN: [id number]  Arrival date & time 03/30/11  20   First MD Initiated Contact with Patient 03/30/11 1841      Chief Complaint  Patient presents with  . Abdominal Pain    (Consider location/radiation/quality/duration/timing/severity/associated sxs/prior treatment) Patient is a 78 y.o. male presenting with abdominal pain.  Abdominal Pain The primary symptoms of the illness include abdominal pain, nausea and vomiting. The primary symptoms of the illness do not include fever, diarrhea, hematemesis or hematochezia. The current episode started more than 2 days ago. The onset of the illness was gradual. The problem has been gradually worsening.  The abdominal pain began more than 2 days ago. The pain came on gradually. The abdominal pain has been gradually worsening since its onset. The abdominal pain is located in the RLQ. The abdominal pain does not radiate. The abdominal pain is relieved by nothing. The abdominal pain is exacerbated by vomiting.  The vomiting began 2 days ago. Vomiting occurs 2 to 5 times per day. The emesis contains stomach contents.  The patient states that she believes she is currently not pregnant. The patient has not had a change in bowel habit. Risk factors for an acute abdominal problem include being elderly and immunosuppression. Symptoms associated with the illness do not include chills, diaphoresis, hematuria, frequency or back pain.    Past Medical History  Diagnosis Date  . Coronary artery disease   . Stroke   . Kidney stones     Past Surgical History  Procedure Date  . Coronary artery bypass graft     No family history on file.  History  Substance Use Topics  . Smoking status: Former Research scientist (life sciences)  . Smokeless tobacco: Not on file  . Alcohol Use: No      Review of Systems  Constitutional: Negative for fever, chills and diaphoresis.  Gastrointestinal: Positive for nausea, vomiting and abdominal pain. Negative for diarrhea, hematochezia  and hematemesis.  Genitourinary: Negative for frequency and hematuria.  Musculoskeletal: Negative for back pain.  All other systems reviewed and are negative.    Allergies  Review of patient's allergies indicates no known allergies.  Home Medications   Current Outpatient Rx  Name Route Sig Dispense Refill  . ASPIRIN 81 MG PO TABS Oral Take 81 mg by mouth daily.    Marland Kitchen CARVEDILOL 3.125 MG PO TABS Oral Take 3.125 mg by mouth 2 (two) times daily with a meal.    . CLOPIDOGREL BISULFATE 75 MG PO TABS Oral Take 75 mg by mouth daily.    . FUROSEMIDE 40 MG PO TABS Oral Take 40 mg by mouth daily.    Marland Kitchen LISINOPRIL 10 MG PO TABS Oral Take 10 mg by mouth daily.    Marland Kitchen POTASSIUM CHLORIDE CRYS ER 20 MEQ PO TBCR Oral Take 20 mEq by mouth daily.    Marland Kitchen SIMVASTATIN 40 MG PO TABS Oral Take 40 mg by mouth every evening.      BP 138/50  Pulse 93  Temp(Src) 99.1 F (37.3 C) (Oral)  Resp 24  SpO2 98%  Physical Exam  Constitutional: He is oriented to person, place, and time. He appears well-developed and well-nourished. No distress.  HENT:  Head: Normocephalic and atraumatic.  Mouth/Throat: No oropharyngeal exudate.  Eyes: EOM are normal. Pupils are equal, round, and reactive to light.  Neck: Normal range of motion. Neck supple.  Cardiovascular: Normal rate and regular rhythm.  Exam reveals no friction rub.   No murmur heard. Pulmonary/Chest: Effort normal  and breath sounds normal. No respiratory distress. He has no wheezes. He has no rales.  Abdominal: Soft. He exhibits no distension. There is tenderness (RLQ). There is rebound (RLQ) and guarding (RLQ).  Genitourinary: Testes normal and penis normal. Right testis shows no mass, no swelling and no tenderness. Left testis shows no mass, no swelling and no tenderness.  Musculoskeletal: Normal range of motion. He exhibits no edema.  Neurological: He is alert and oriented to person, place, and time.  Skin: He is not diaphoretic.    ED Course    Procedures (including critical care time)  Labs Reviewed  CBC - Abnormal; Notable for the following:    WBC 17.9 (*)    All other components within normal limits  DIFFERENTIAL - Abnormal; Notable for the following:    Neutro Abs 13.0 (*)    Monocytes Absolute 2.1 (*)    All other components within normal limits  COMPREHENSIVE METABOLIC PANEL - Abnormal; Notable for the following:    Potassium 5.4 (*) HEMOLYZED SPECIMEN, RESULTS MAY BE AFFECTED   Glucose, Bld 104 (*)    GFR calc non Af Amer 54 (*)    GFR calc Af Amer 62 (*)    All other components within normal limits  URINALYSIS, ROUTINE W REFLEX MICROSCOPIC   No results found.   1. Abdominal pain       MDM  47 are old male presents from urgent care with abdominal pain, nausea, vomiting for the past 3 days. Denies fevers, hematuria, dysuria. Denies difficulty with bowel movements. Occasional diarrhea. History of coronary artery disease, stroke, kidney stones. Patient's afebrile vital signs are stable. Patient has lab work showing leukocytosis from outside urgent care. Right lower quadrant pain, guarding, rebound tenderness present.  CT Abd/Pelvis ordered - patient care transferred to [facility], [place].       Evelina Bucy, MD 03/30/11 2003

## 2011-03-30 NOTE — Anesthesia Preprocedure Evaluation (Addendum)
Anesthesia Evaluation  Patient identified by MRN, date of birth, ID band Patient awake    Reviewed: Allergy & Precautions, H&P , NPO status , Patient's Chart, lab work & pertinent test results  History of Anesthesia Complications Negative for: history of anesthetic complications  Airway Mallampati: I TM Distance: >3 FB Neck ROM: full    Dental   Pulmonary neg pulmonary ROS,          Cardiovascular + CAD and + CABG regular Normal    Neuro/Psych CVA Negative Psych ROS   GI/Hepatic negative GI ROS, Neg liver ROS,   Endo/Other  Negative Endocrine ROS  Renal/GU negative Renal ROS  Genitourinary negative   Musculoskeletal negative musculoskeletal ROS (+)   Abdominal   Peds  Hematology negative hematology ROS (+)   Anesthesia Other Findings   Reproductive/Obstetrics                          Anesthesia Physical Anesthesia Plan  ASA: III  Anesthesia Plan: General   Post-op Pain Management:    Induction: Intravenous, Rapid sequence and Cricoid pressure planned  Airway Management Planned: Oral ETT  Additional Equipment:   Intra-op Plan:   Post-operative Plan: Extubation in OR  Informed Consent: I have reviewed the patients History and Physical, chart, labs and discussed the procedure including the risks, benefits and alternatives for the proposed anesthesia with the patient or authorized representative who has indicated his/her understanding and acceptance.   Dental advisory given  Plan Discussed with: CRNA, Anesthesiologist and Surgeon  Anesthesia Plan Comments:         Anesthesia Quick Evaluation

## 2011-03-30 NOTE — ED Notes (Signed)
Returned from ct 

## 2011-03-30 NOTE — ED Notes (Signed)
Patient with gradual onset of abdominal pain and nausea since Saturday.  Patient describes pain as a sharp pain, lower right quadrant.  Patient is very tender in right lower quadrant upon light palpation.  Patient states that he vomitted last night, but continues with pain and nausea.

## 2011-03-30 NOTE — ED Notes (Signed)
PT. REPORTS RLQ PAIN WITH VOMITTING ONSET LAST Saturday , DENIES FEVER OR CHILLS .

## 2011-03-30 NOTE — ED Notes (Signed)
Spouse in [place]

## 2011-03-30 NOTE — ED Notes (Signed)
[facility] sent with patient to OR

## 2011-03-30 NOTE — ED Provider Notes (Signed)
Acute appendicitis on CT scan. Patient requiring additional pain medications. Gen Surg. Paged.  Leotis Shames, PA-C 03/30/11 2149

## 2011-03-30 NOTE — H&P (Signed)
Gregory [name] is an 78 y.o. male.   Chief Complaint: abdominal pain HPI: The patient presents with right lower quadrant abdominal pain. We were asked to see the patient at the request of [name] do to right lower quadrant abdominal pain for 2 days. The pain started Saturday morning as a dull ache located in the midabdomen and has progressed to his right lower quadrant and is more severe and sharp and constant. Positive for anorexia and nausea and vomiting. The pain is made worse with movement and palpation. It is made better by lying still. CT was done which shows acute appendicitis without abscess or perforation.  Past Medical History  Diagnosis Date  . Coronary artery disease   . Stroke   . Kidney stones     Past Surgical History  Procedure Date  . Coronary artery bypass graft     No family history on file. Social History:  reports that he has quit smoking. He does not have any smokeless tobacco history on file. He reports that he does not drink alcohol or use illicit drugs.  Allergies: No Known Allergies  Medications Prior to Admission  Medication Dose Route Frequency Provider Last Rate Last Dose  . ertapenem (INVANZ) 1 g in sodium chloride 0.9 % 50 mL IVPB  1 g Intravenous Once Shari A Narvaez, PA-C      . iohexol (OMNIPAQUE) 300 MG/ML solution 100 mL  100 mL Intravenous Once PRN Medication Radiologist      . morphine 4 MG/ML injection 4 mg  4 mg Intravenous Once Evelina Bucy, MD   4 mg at 03/30/11 1946  . morphine 4 MG/ML injection 4 mg  4 mg Intravenous Once Leotis Shames, PA-C   4 mg at 03/30/11 2222  . ondansetron (ZOFRAN) injection 4 mg  4 mg Intravenous Once Evelina Bucy, MD   4 mg at 03/30/11 1942   No current outpatient prescriptions on file as of 03/30/2011.    Results for orders placed during the hospital encounter of 03/30/11 (from the past 48 hour(s))  CBC     Status: Abnormal   Collection Time   03/30/11  5:15 PM      Component Value Range Comment   WBC 17.9  (*) 4.0 - 10.5 (K/uL)    RBC 4.93  4.22 - 5.81 (MIL/uL)    Hemoglobin 15.5  13.0 - 17.0 (g/dL)    HCT 44.6  39.0 - 52.0 (%)    MCV 90.5  78.0 - 100.0 (fL)    MCH 31.4  26.0 - 34.0 (pg)    MCHC 34.8  30.0 - 36.0 (g/dL)    RDW 13.4  11.5 - 15.5 (%)    Platelets 227  150 - 400 (K/uL)   DIFFERENTIAL     Status: Abnormal   Collection Time   03/30/11  5:15 PM      Component Value Range Comment   Neutrophils Relative 73  43 - 77 (%)    Neutro Abs 13.0 (*) 1.7 - 7.7 (K/uL)    Lymphocytes Relative 15  12 - 46 (%)    Lymphs Abs 2.7  0.7 - 4.0 (K/uL)    Monocytes Relative 12  3 - 12 (%)    Monocytes Absolute 2.1 (*) 0.1 - 1.0 (K/uL)    Eosinophils Relative 0  0 - 5 (%)    Eosinophils Absolute 0.1  0.0 - 0.7 (K/uL)    Basophils Relative 0  0 - 1 (%)    Basophils Absolute  0.0  0.0 - 0.1 (K/uL)   COMPREHENSIVE METABOLIC PANEL     Status: Abnormal   Collection Time   03/30/11  5:15 PM      Component Value Range Comment   Sodium 138  135 - 145 (mEq/L)    Potassium 5.4 (*) 3.5 - 5.1 (mEq/L) HEMOLYZED SPECIMEN, RESULTS MAY BE AFFECTED   Chloride 98  96 - 112 (mEq/L)    CO2 28  19 - 32 (mEq/L)    Glucose, Bld 104 (*) 70 - 99 (mg/dL)    BUN 20  6 - 23 (mg/dL)    Creatinine, Ser 1.26  0.50 - 1.35 (mg/dL)    Calcium 9.6  8.4 - 10.5 (mg/dL)    Total Protein 7.8  6.0 - 8.3 (g/dL)    Albumin 3.6  3.5 - 5.2 (g/dL)    AST 26  0 - 37 (U/L) HEMOLYSIS AT THIS LEVEL MAY AFFECT RESULT   ALT 16  0 - 53 (U/L)    Alkaline Phosphatase 95  39 - 117 (U/L)    Total Bilirubin 0.9  0.3 - 1.2 (mg/dL)    GFR calc non Af Amer 54 (*) >90 (mL/min)    GFR calc Af Amer 62 (*) >90 (mL/min)    Ct Abdomen Pelvis W Contrast  03/30/2011  *RADIOLOGY REPORT*  Clinical Data: Right lower quadrant abdominal pain.  CT ABDOMEN AND PELVIS WITH CONTRAST  Technique:  Multidetector CT imaging of the abdomen and pelvis was performed following the standard protocol during bolus administration of intravenous contrast.  Contrast:  100 ml  Omnipaque-300  Comparison: None  Findings: The lung bases are clear.  No pleural effusion.  The liver is unremarkable.  No focal lesions or biliary dilatation. The gallbladder is normal.  No common bile duct dilatation.  The pancreas is normal.  The spleen is normal in size.  No focal lesions.  The adrenal glands and kidneys are unremarkable.  And upper pole left renal cyst is noted.  The stomach, duodenum, small bowel and colon are unremarkable.  The appendix is distended and fluid-filled with surrounding inflammatory change consistent with acute appendicitis.  There is a proximal obstructing appendicolith.  The bladder, prostate gland and seminal vesicles are unremarkable. No pelvic mass, adenopathy or free pelvic fluid collections.  The aorta demonstrates moderate atherosclerotic calcifications.  The major branch vessels are patent.  No mesenteric or retroperitoneal masses or adenopathy.  A left inguinal hernia containing fat is noted.  The bony structures are unremarkable.  IMPRESSION: CT findings consistent with acute appendicitis.  Original Report Authenticated By: P. Kalman Jewels, M.D.    Review of Systems  Constitutional: Positive for malaise/fatigue.  HENT: Negative.   Eyes: Negative.   Respiratory: Negative.   Cardiovascular: Negative.   Gastrointestinal: Positive for vomiting and abdominal pain.  Genitourinary: Negative.   Musculoskeletal: Negative.   Skin: Negative.   Neurological: Positive for weakness.  Endo/Heme/Allergies: Negative.   Psychiatric/Behavioral: Negative.     Blood pressure 143/66, pulse 93, temperature 98.3 F (36.8 C), temperature source Oral, resp. rate 25, height 5\' 10"  (1.778 m), weight 200 lb (90.719 kg), SpO2 99.00%. Physical Exam  Constitutional: He is oriented to person, place, and time. He appears well-developed and well-nourished.  HENT:  Head: Normocephalic and atraumatic.  Eyes: EOM are normal. Pupils are equal, round, and reactive to light.  Neck:  Normal range of motion. Neck supple.  Cardiovascular: Normal rate, normal heart sounds and intact distal pulses.  A regularly irregular rhythm present.  Respiratory: Effort normal and breath sounds normal.  GI: Soft. Bowel sounds are normal. There is tenderness in the right lower quadrant. There is rebound and tenderness at McBurney's point. A hernia is present. Hernia confirmed positive in the left inguinal area.    Neurological: He is alert and oriented to person, place, and time. GCS eye subscore is 4. GCS verbal subscore is 5. GCS motor subscore is 6.  Skin: Skin is warm, dry and intact.     Psychiatric: He has a normal mood and affect. His speech is normal and behavior is normal. Thought content normal. Cognition and memory are normal.     Assessment/Plan Acute appendicitis plavix CAD HTN  OR laparoscopic possible open appendectomy.  Discussed medical management as well. He has significant peritonitis and I do not feel medical management is appropriate. I feel laparoscopy with possible laparotomy will be necessary to remove his appendix. He is on Plavix but has not take the Plavix for 2 days. Risks of bleeding, infection, or injury, pelvic abscess, worsening of cardiovascular disease, myocardial infarction, stroke, blood clots, and possible death all risk of surgical revision. Medical management the risks include worsening of condition and increasing risk of surgical complications by delaying surgery. He would like to proceed to surgery.  Towanna Avery A. 03/30/2011, 10:24 PM

## 2011-03-30 NOTE — ED Provider Notes (Signed)
The patient has localized percussion tenderness and localized rebound tenderness to the right lower quadrant awaiting CT scan and likely admission.  Medical screening examination/treatment/procedure(s) were conducted as a shared visit with non-physician practitioner(s) and myself.  I personally evaluated the patient during the encounter  I saw and evaluated the patient, reviewed the resident's note and I agree with the findings and plan.  Babette Relic, MD 04/03/11 [phone]

## 2011-03-31 ENCOUNTER — Encounter (HOSPITAL_COMMUNITY): Payer: Self-pay | Admitting: *Deleted

## 2011-03-31 ENCOUNTER — Other Ambulatory Visit: Payer: Self-pay

## 2011-03-31 DIAGNOSIS — I639 Cerebral infarction, unspecified: Secondary | ICD-10-CM

## 2011-03-31 DIAGNOSIS — K3533 Acute appendicitis with perforation and localized peritonitis, with abscess: Secondary | ICD-10-CM | POA: Diagnosis present

## 2011-03-31 DIAGNOSIS — I2510 Atherosclerotic heart disease of native coronary artery without angina pectoris: Secondary | ICD-10-CM | POA: Diagnosis present

## 2011-03-31 DIAGNOSIS — K3580 Unspecified acute appendicitis: Secondary | ICD-10-CM

## 2011-03-31 DIAGNOSIS — K219 Gastro-esophageal reflux disease without esophagitis: Secondary | ICD-10-CM

## 2011-03-31 DIAGNOSIS — I255 Ischemic cardiomyopathy: Secondary | ICD-10-CM | POA: Diagnosis present

## 2011-03-31 DIAGNOSIS — N200 Calculus of kidney: Secondary | ICD-10-CM

## 2011-03-31 DIAGNOSIS — Z87891 Personal history of nicotine dependence: Secondary | ICD-10-CM

## 2011-03-31 DIAGNOSIS — I482 Chronic atrial fibrillation, unspecified: Secondary | ICD-10-CM | POA: Diagnosis present

## 2011-03-31 HISTORY — DX: Acute appendicitis with perforation, localized peritonitis, and gangrene, with abscess: K35.33

## 2011-03-31 LAB — COMPREHENSIVE METABOLIC PANEL
Albumin: 3.1 g/dL — ABNORMAL LOW (ref 3.5–5.2)
Alkaline Phosphatase: 91 U/L (ref 39–117)
BUN: 18 mg/dL (ref 6–23)
Chloride: 99 mEq/L (ref 96–112)
Creatinine, Ser: 1.21 mg/dL (ref 0.50–1.35)
GFR calc Af Amer: 65 mL/min — ABNORMAL LOW (ref >90)
Glucose, Bld: 162 mg/dL — ABNORMAL HIGH (ref 70–99)
Potassium: 4.6 mEq/L (ref 3.5–5.1)
Total Bilirubin: 1.6 mg/dL — ABNORMAL HIGH (ref 0.3–1.2)

## 2011-03-31 LAB — MRSA PCR SCREENING: MRSA by PCR: NEGATIVE

## 2011-03-31 LAB — CBC
HCT: 43 % (ref 39.0–52.0)
Hemoglobin: 14.5 g/dL (ref 13.0–17.0)
MCHC: 33.7 g/dL (ref 30.0–36.0)
MCV: 92.3 fL (ref 78.0–100.0)
RDW: 13.4 % (ref 11.5–15.5)
WBC: 20.6 10*3/uL — ABNORMAL HIGH (ref 4.0–10.5)

## 2011-03-31 MED ORDER — ONDANSETRON HCL 4 MG/2ML IJ SOLN
INTRAMUSCULAR | Status: DC | PRN
  Administered 2011-03-31: 4 mg via INTRAVENOUS

## 2011-03-31 MED ORDER — LISINOPRIL 10 MG PO TABS
10.0000 mg | ORAL_TABLET | Freq: Every day | ORAL | Status: DC
  Administered 2011-03-31 – 2011-04-01 (×2): 10 mg via ORAL
  Filled 2011-03-31 (×2): qty 1

## 2011-03-31 MED ORDER — ONDANSETRON HCL 4 MG PO TABS: 4.0000 mg | ORAL_TABLET | Freq: Four times a day (QID) | ORAL | Status: DC | PRN

## 2011-03-31 MED ORDER — POTASSIUM CHLORIDE CRYS ER 20 MEQ PO TBCR
20.0000 meq | EXTENDED_RELEASE_TABLET | Freq: Every day | ORAL | Status: DC
  Administered 2011-03-31 – 2011-04-01 (×2): 20 meq via ORAL
  Filled 2011-03-31 (×2): qty 1

## 2011-03-31 MED ORDER — SODIUM CHLORIDE 0.9 % IV SOLN: 1.0000 g | INTRAVENOUS | Status: DC

## 2011-03-31 MED ORDER — SODIUM CHLORIDE 0.9 % IV SOLN
INTRAVENOUS | Status: DC
  Administered 2011-03-31: 100 mL/h via INTRAVENOUS
  Administered 2011-03-31: 1000 mL via INTRAVENOUS
  Administered 2011-04-01: 17:00:00 via INTRAVENOUS
  Administered 2011-04-01: 100 mL/h via INTRAVENOUS
  Administered 2011-04-02: 20 mL via INTRAVENOUS
  Administered 2011-04-02: 05:00:00 via INTRAVENOUS
  Administered 2011-04-02: 20 mL via INTRAVENOUS

## 2011-03-31 MED ORDER — PANTOPRAZOLE SODIUM 40 MG IV SOLR
40.0000 mg | Freq: Every day | INTRAVENOUS | Status: DC
  Administered 2011-03-31 – 2011-04-02 (×3): 40 mg via INTRAVENOUS
  Filled 2011-03-31 (×4): qty 40

## 2011-03-31 MED ORDER — HYDROMORPHONE HCL PF 1 MG/ML IJ SOLN
INTRAMUSCULAR | Status: AC
  Filled 2011-03-31: qty 1

## 2011-03-31 MED ORDER — ENOXAPARIN SODIUM 40 MG/0.4ML ~~LOC~~ SOLN
40.0000 mg | Freq: Every day | SUBCUTANEOUS | Status: DC
  Administered 2011-04-01 – 2011-04-03 (×3): 40 mg via SUBCUTANEOUS
  Filled 2011-03-31 (×4): qty 0.4

## 2011-03-31 MED ORDER — KCL IN DEXTROSE-NACL 20-5-0.9 MEQ/L-%-% IV SOLN
INTRAVENOUS | Status: DC
  Administered 2011-03-31: 05:00:00 via INTRAVENOUS
  Filled 2011-03-31 (×2): qty 1000

## 2011-03-31 MED ORDER — ONDANSETRON HCL 4 MG/2ML IJ SOLN: 4.0000 mg | Freq: Four times a day (QID) | INTRAMUSCULAR | Status: DC | PRN

## 2011-03-31 MED ORDER — DEXAMETHASONE SODIUM PHOSPHATE 4 MG/ML IJ SOLN
INTRAMUSCULAR | Status: DC | PRN
  Administered 2011-03-31: 4 mg via INTRAVENOUS

## 2011-03-31 MED ORDER — HEMOSTATIC AGENTS (NO CHARGE) OPTIME
TOPICAL | Status: DC | PRN
  Administered 2011-03-31: 1 via TOPICAL

## 2011-03-31 MED ORDER — FUROSEMIDE 40 MG PO TABS
40.0000 mg | ORAL_TABLET | Freq: Every day | ORAL | Status: DC
  Administered 2011-03-31: 40 mg via ORAL
  Filled 2011-03-31 (×2): qty 1

## 2011-03-31 MED ORDER — OXYCODONE-ACETAMINOPHEN 5-325 MG PO TABS
1.0000 | ORAL_TABLET | ORAL | Status: DC | PRN
  Administered 2011-03-31 – 2011-04-01 (×3): 1 via ORAL
  Filled 2011-03-31 (×2): qty 1
  Filled 2011-03-31: qty 2

## 2011-03-31 MED ORDER — HYDROMORPHONE HCL PF 1 MG/ML IJ SOLN
1.0000 mg | INTRAMUSCULAR | Status: DC | PRN
  Administered 2011-03-31 (×4): 1 mg via INTRAVENOUS
  Filled 2011-03-31 (×4): qty 1

## 2011-03-31 MED ORDER — LABETALOL HCL 5 MG/ML IV SOLN
5.0000 mg | INTRAVENOUS | Status: DC | PRN
  Administered 2011-03-31: 10 mg via INTRAVENOUS

## 2011-03-31 MED ORDER — METOPROLOL TARTRATE 1 MG/ML IV SOLN
5.0000 mg | Freq: Four times a day (QID) | INTRAVENOUS | Status: DC | PRN
  Administered 2011-04-03: 5 mg via INTRAVENOUS
  Filled 2011-03-31: qty 5

## 2011-03-31 MED ORDER — HYDROMORPHONE HCL PF 1 MG/ML IJ SOLN
0.2500 mg | INTRAMUSCULAR | Status: DC | PRN
  Administered 2011-03-31 (×4): 0.5 mg via INTRAVENOUS

## 2011-03-31 MED ORDER — ONDANSETRON HCL 4 MG/2ML IJ SOLN: 4.0000 mg | Freq: Once | INTRAMUSCULAR | Status: DC | PRN

## 2011-03-31 MED ORDER — SIMVASTATIN 40 MG PO TABS
40.0000 mg | ORAL_TABLET | Freq: Every evening | ORAL | Status: DC
  Administered 2011-03-31 – 2011-04-02 (×3): 40 mg via ORAL
  Filled 2011-03-31 (×4): qty 1

## 2011-03-31 MED ORDER — SODIUM CHLORIDE 0.9 % IV SOLN
1.0000 g | Freq: Every day | INTRAVENOUS | Status: DC
  Administered 2011-03-31 – 2011-04-02 (×3): 1 g via INTRAVENOUS
  Filled 2011-03-31 (×5): qty 1

## 2011-03-31 MED ORDER — LEVALBUTEROL HCL 0.63 MG/3ML IN NEBU
0.6300 mg | INHALATION_SOLUTION | Freq: Three times a day (TID) | RESPIRATORY_TRACT | Status: DC | PRN
  Filled 2011-03-31: qty 3

## 2011-03-31 MED ORDER — ASPIRIN 81 MG PO CHEW
81.0000 mg | CHEWABLE_TABLET | Freq: Every day | ORAL | Status: DC
  Administered 2011-03-31 – 2011-04-03 (×4): 81 mg via ORAL
  Filled 2011-03-31 (×4): qty 1

## 2011-03-31 MED ORDER — CARVEDILOL 3.125 MG PO TABS
3.1250 mg | ORAL_TABLET | Freq: Two times a day (BID) | ORAL | Status: DC
  Administered 2011-03-31 – 2011-04-02 (×5): 3.125 mg via ORAL
  Filled 2011-03-31 (×7): qty 1

## 2011-03-31 MED ORDER — DROPERIDOL 2.5 MG/ML IJ SOLN
INTRAMUSCULAR | Status: DC | PRN
  Administered 2011-03-31: 0.625 mg via INTRAVENOUS

## 2011-03-31 NOTE — Consult Note (Signed)
Reason for Consult: AFIB Hx of CAD. Referring Physician:   SHADDIX Gregory [name] is an 78 y.o. male.  HPI: she is a 78 year old Caucasian male sees Dr. Rollene [name] to [facility]. History of coronary disease and LV dysfunction, chronic atrial fibrillation, for which he only takes Plavix.  PVD with 50-69% bilateral carotid artery stenosis. Coronary bypass grafting x3 in 1993(LIMA to LAD,  SVG to OM SVG to PDA/PLA) last cardiac catheterization was April 2008 showed patent grafts. Ejection fraction at that time was 20-25% but showed improvement to 35-45% by echo 11/2010.  The atrium severely dilated.   Prophylactic ICD placement has been discussed several times and the patient has declined each time. He suffered a right brain CVA in December 2005. He also has a history of gastroesophageal reflux disease. Patient presented in the ED with abdominal pain.  S/P appendectomy.  POD 1.  Patient is resting in bed.  Denies chest pain, SOB.    Past Medical History  Diagnosis Date  . Coronary artery disease -- CABG x 3 - 93; LIMA to LAD,  SVG to OM SVG to PDA/PLA -- Last EF by Echo 9/12: 35-40% (improved from 2008), severely dilated atria Myoview 311 - fixed inferior & Inferolateral defect c/x prior MI    Chronic Afib - rate controlled, not on anticoagulation due to warfarin side effects, on ASA/Plavix;  -- CHADS2 Score - high   . Stroke   . Kidney stones     Past Surgical History  Procedure Date  . Coronary artery bypass graft  --   LIMA to LAD,  SVG to OM SVG to PDA/PLA 1993    No family history on file.  Social History:  reports that he has quit smoking. He does not have any smokeless tobacco history on file. He reports that he does not drink alcohol or use illicit drugs.  Allergies: No Known Allergies     . aspirin  81 mg Oral Daily  . carvedilol  3.125 mg Oral BID WC  . enoxaparin  40 mg Subcutaneous Daily  . ertapenem  1 g Intravenous Once  . ertapenem (INVANZ) IV  1 g  Intravenous QHS  . furosemide  40 mg Oral Daily  . HYDROmorphone      . HYDROmorphone      . lisinopril  10 mg Oral Daily  .  morphine injection  4 mg Intravenous Once  .  morphine injection  4 mg Intravenous Once  . ondansetron (ZOFRAN) IV  4 mg Intravenous Once  . pantoprazole (PROTONIX) IV  40 mg Intravenous Q1200  . potassium chloride SA  20 mEq Oral Daily  . simvastatin  40 mg Oral QPM  . DISCONTD: ertapenem  1 g Intravenous 60 min Pre-Op    Results for orders placed during the hospital encounter of 03/30/11 (from the past 48 hour(s))  CBC     Status: Abnormal   Collection Time   03/30/11  5:15 PM      Component Value Range Comment   WBC 17.9 (*) 4.0 - 10.5 (K/uL)    RBC 4.93  4.22 - 5.81 (MIL/uL)    Hemoglobin 15.5  13.0 - 17.0 (g/dL)    HCT 44.6  39.0 - 52.0 (%)    MCV 90.5  78.0 - 100.0 (fL)    MCH 31.4  26.0 - 34.0 (pg)    MCHC 34.8  30.0 - 36.0 (g/dL)    RDW 13.4  11.5 - 15.5 (%)  Platelets 227  150 - 400 (K/uL)   DIFFERENTIAL     Status: Abnormal   Collection Time   03/30/11  5:15 PM      Component Value Range Comment   Neutrophils Relative 73  43 - 77 (%)    Neutro Abs 13.0 (*) 1.7 - 7.7 (K/uL)    Lymphocytes Relative 15  12 - 46 (%)    Lymphs Abs 2.7  0.7 - 4.0 (K/uL)    Monocytes Relative 12  3 - 12 (%)    Monocytes Absolute 2.1 (*) 0.1 - 1.0 (K/uL)    Eosinophils Relative 0  0 - 5 (%)    Eosinophils Absolute 0.1  0.0 - 0.7 (K/uL)    Basophils Relative 0  0 - 1 (%)    Basophils Absolute 0.0  0.0 - 0.1 (K/uL)   COMPREHENSIVE METABOLIC PANEL     Status: Abnormal   Collection Time   03/30/11  5:15 PM      Component Value Range Comment   Sodium 138  135 - 145 (mEq/L)    Potassium 5.4 (*) 3.5 - 5.1 (mEq/L) HEMOLYZED SPECIMEN, RESULTS MAY BE AFFECTED   Chloride 98  96 - 112 (mEq/L)    CO2 28  19 - 32 (mEq/L)    Glucose, Bld 104 (*) 70 - 99 (mg/dL)    BUN 20  6 - 23 (mg/dL)    Creatinine, Ser 1.26  0.50 - 1.35 (mg/dL)    Calcium 9.6  8.4 - 10.5 (mg/dL)     Total Protein 7.8  6.0 - 8.3 (g/dL)    Albumin 3.6  3.5 - 5.2 (g/dL)    AST 26  0 - 37 (U/L) HEMOLYSIS AT THIS LEVEL MAY AFFECT RESULT   ALT 16  0 - 53 (U/L)    Alkaline Phosphatase 95  39 - 117 (U/L)    Total Bilirubin 0.9  0.3 - 1.2 (mg/dL)    GFR calc non Af Amer 54 (*) >90 (mL/min)    GFR calc Af Amer 62 (*) >90 (mL/min)   URINALYSIS, ROUTINE Gregory [name]     Status: Abnormal   Collection Time   03/30/11 10:24 PM      Component Value Range Comment   Color, Urine AMBER (*) YELLOW  BIOCHEMICALS MAY BE AFFECTED BY COLOR   APPearance CLEAR  CLEAR     Specific Gravity, Urine 1.038 (*) 1.005 - 1.030     pH 5.5  5.0 - 8.0     Glucose, UA NEGATIVE  NEGATIVE (mg/dL)    Hgb urine dipstick NEGATIVE  NEGATIVE     Bilirubin Urine SMALL (*) NEGATIVE     Ketones, ur 15 (*) NEGATIVE (mg/dL)    Protein, ur 30 (*) NEGATIVE (mg/dL)    Urobilinogen, UA 1.0  0.0 - 1.0 (mg/dL)    Nitrite NEGATIVE  NEGATIVE     Leukocytes, UA NEGATIVE  NEGATIVE    URINE MICROSCOPIC-ADD ON     Status: Abnormal   Collection Time   03/30/11 10:24 PM      Component Value Range Comment   Squamous Epithelial / LPF RARE  RARE     WBC, UA 0-2  <3 (WBC/hpf)    Bacteria, UA RARE  RARE     Casts HYALINE CASTS (*) NEGATIVE    MRSA PCR SCREENING     Status: Normal   Collection Time   03/31/11  4:32 AM      Component Value Range Comment   MRSA  by PCR NEGATIVE  NEGATIVE    CBC     Status: Abnormal   Collection Time   03/31/11  9:13 AM      Component Value Range Comment   WBC 20.6 (*) 4.0 - 10.5 (K/uL)    RBC 4.66  4.22 - 5.81 (MIL/uL)    Hemoglobin 14.5  13.0 - 17.0 (g/dL)    HCT 43.0  39.0 - 52.0 (%)    MCV 92.3  78.0 - 100.0 (fL)    MCH 31.1  26.0 - 34.0 (pg)    MCHC 33.7  30.0 - 36.0 (g/dL)    RDW 13.4  11.5 - 15.5 (%)    Platelets 203  150 - 400 (K/uL)   COMPREHENSIVE METABOLIC PANEL     Status: Abnormal   Collection Time   03/31/11  9:13 AM      Component Value Range Comment   Sodium 137  135 - 145  (mEq/L)    Potassium 4.6  3.5 - 5.1 (mEq/L)    Chloride 99  96 - 112 (mEq/L)    CO2 26  19 - 32 (mEq/L)    Glucose, Bld 162 (*) 70 - 99 (mg/dL)    BUN 18  6 - 23 (mg/dL)    Creatinine, Ser 1.21  0.50 - 1.35 (mg/dL)    Calcium 8.7  8.4 - 10.5 (mg/dL)    Total Protein 7.1  6.0 - 8.3 (g/dL)    Albumin 3.1 (*) 3.5 - 5.2 (g/dL)    AST 17  0 - 37 (U/L)    ALT 13  0 - 53 (U/L)    Alkaline Phosphatase 91  39 - 117 (U/L)    Total Bilirubin 1.6 (*) 0.3 - 1.2 (mg/dL)    GFR calc non Af Amer 56 (*) >90 (mL/min)    GFR calc Af Amer 65 (*) >90 (mL/min)     Ct Abdomen Pelvis Gregory [name] Contrast  03/30/2011  *RADIOLOGY REPORT*  Clinical Data: Right lower quadrant abdominal pain.  CT ABDOMEN AND PELVIS WITH CONTRAST  Technique:  Multidetector CT imaging of the abdomen and pelvis was performed following the standard protocol during bolus administration of intravenous contrast.  Contrast:  100 ml Omnipaque-300  Comparison: None  Findings: The lung bases are clear.  No pleural effusion.  The liver is unremarkable.  No focal lesions or biliary dilatation. The gallbladder is normal.  No common bile duct dilatation.  The pancreas is normal.  The spleen is normal in size.  No focal lesions.  The adrenal glands and kidneys are unremarkable.  And upper pole left renal cyst is noted.  The stomach, duodenum, small bowel and colon are unremarkable.  The appendix is distended and fluid-filled with surrounding inflammatory change consistent with acute appendicitis.  There is a proximal obstructing appendicolith.  The bladder, prostate gland and seminal vesicles are unremarkable. No pelvic mass, adenopathy or free pelvic fluid collections.  The aorta demonstrates moderate atherosclerotic calcifications.  The major branch vessels are patent.  No mesenteric or retroperitoneal masses or adenopathy.  A left inguinal hernia containing fat is noted.  The bony structures are unremarkable.  IMPRESSION: CT findings consistent with acute  appendicitis.  Original Report Authenticated By: P. Kalman Jewels, M.D.    Review of Systems  Cardiovascular: Negative for chest pain, palpitations, orthopnea and leg swelling.  Gastrointestinal: Positive for abdominal pain. Negative for nausea and vomiting.  Neurological: Negative for dizziness and headaches.   Blood pressure 156/98, pulse 115, temperature 97.8 F (36.6  C), temperature source Oral, resp. rate 15, height 5\' 10"  (1.778 m), weight 90.719 kg (200 lb), SpO2 100.00%.   Physical Exam  Constitutional: He is oriented to person, place, and time. He appears well-nourished.  HENT:  Head: Normocephalic and atraumatic.  Eyes: EOM are normal.  Neck: No JVD present. Carotid bruit is not present.  Cardiovascular: An irregularly irregular rhythm present. Tachycardia present.   No murmur heard. Respiratory: He has wheezes.       Upper lobes R>L  GI: Bowel sounds are normal. He exhibits distension. There is no tenderness.  Musculoskeletal: He exhibits no edema.  Neurological: He is alert and oriented to person, place, and time. He exhibits normal muscle tone.  Skin: Skin is warm and dry.  Psychiatric: He has a normal mood and affect.    Assessment/Plan: Patient Active Hospital Problem List: Appendicitis with abscess (03/31/2011) CAD (coronary artery disease) (03/31/2011) CAF (chronic atrial fibrillation) (03/31/2011) Cardiomyopathy, ischemic (03/31/2011) CVA (cerebral vascular accident) (03/31/2011) GERD (gastroesophageal reflux disease) (03/31/2011) Renal calculi (03/31/2011) History of tobacco use (03/31/2011)  Plan:  POST OP day 1.  Appendectomy.  EKG shows AFIB with RVR.   Patient relatively well controlled on telemetry. Patient hadn't taken any meds for 3 days due to nausea and vomiting.  May have some rebound tachycardia.  Does not take coumadin due to side effects per family.  He is on long-term Plavix.  We will continue to monitor his EKG.  Add xopenex treatment prn for  wheezing.  The pt needs to resume plavix when cleared by surgeon.  Worsening leukocytosis.  ASA/coreg 3.125/lisinopril.    Gregory Gregory [name],Gregory Gregory [name] 03/31/2011, 11:33 AM    I have seen and examined the patient along with Gregory Fuller, PA.  I have reviewed the chart, notes and new data.  I agree with Gregory's note.  Key new complaints: "I am hurting", RN noted - no UOP yet Key examination changes: Wheezes seem better after breathing Rx (? Some RAD component with long distant smoking Hx) Key new findings / data: Elevated WBC -c/Gregory [name] appendicitis inflammation; Afib borderline rapid rate (90s - low 100s)  78 y/o man with long-standing ICM (EF continues to improve) s/p CABG & Chronic Afib with CVA Hx - admitted for acute appendicitis, now POD 1 s/p Appendectomy. Noted to have increased HR with Afib, PVCs (vs. abberrantly conducted beats) Not symptomatic with palpitions/ CP or CHF  Reasons for increased HR include dehydration (no PO intake x 3 days & inflammation), pain, infection.  PLAN:  Agree with gentle IV hydration - avoid XS Ins with low EF but he is likely volume down & would tolerate gentle hydration. Continue PO lasix to avoid getting "behind"  Increase BB dose to 6.25  - has BP room  Adequate pain & fever control  Restart Plavix when safe post-op.  Based upon his high CVA risk, he would be better served with Pradaxa or Xarelto for long term anticoagulation in lieu of ASA/Plavix (which are minimally effective based upon recent studies).  He is reluctant to take any new medications.  I discussed the options with him & he will take it under advisement & d/Gregory [name] Dr. Marella Chimes on f/u.  For now continue current Rx plan for ASA/Plavix.  IF HR were to increase to > 110, 120 & maintain elevated rate - would use IV Metoprolol (5mg ) as 1st line then consider Cardizem bolus / gtt as post-op rapid rate is more likely due to adrenergic response.    We will continue to  follow.  Call with ?s.  Leonie Man,  M.D., M.S. [facility] [address] [place], [place]  [id number]  [phone]  03/31/2011 12:51 PM

## 2011-03-31 NOTE — Op Note (Signed)
Appendectomy, Lap, Procedure Note  Indications: The patient presented with a history of right-sided abdominal pain. A CT revealed findings consistent with acute appendicitis.  Pre-operative Diagnosis: Acute appendicitis with generalized peritonitis  Post-operative Diagnosis: Acute appendicitis with peritoneal abscess  Surgeon: Erroll Luna A.   Assistants: OR  Anesthesia: General endotracheal anesthesia and Local anesthesia 0.25.% bupivacaine, with epinephrine  ASA Class: 3  Procedure Details  The patient was seen again in the Holding Room. The risks, benefits, complications, treatment options, and expected outcomes were discussed with the patient and/or family. The possibilities of reaction to medication, pulmonary aspiration, perforation of viscus, bleeding, recurrent infection, finding a normal appendix, the need for additional procedures, failure to diagnose a condition, and creating a complication requiring transfusion or operation were discussed. There was concurrence with the proposed plan and informed consent was obtained. The site of surgery was properly noted/marked. The patient was taken to Operating Room, identified as Gregory [name] and the procedure verified as Appendectomy. A Time Out was held and the above information confirmed.  The patient was placed in the supine position and general anesthesia was induced, along with placement of orogastric tube, Venodyne boots, and a Foley catheter. The abdomen was prepped and draped in a sterile fashion. A one centimeter infraumbilical incision was made and the peritoneal cavity was accessed using the Veress needle technique. The pneumoperitoneum was then established to steady pressure of 12 mmHg. A 12 mm port was placed through the umbilical incision. Additional 5 mm cannulas then placed in the left lower quadrant of the abdomen and half way between the umbilicus and pubic symphysis under direct vision. A careful evaluation of the entire  abdomen was carried out. The patient was placed in Trendelenburg and left lateral decubitus position. The small intestines were retracted in the cephalad and left lateral direction away from the pelvis and right lower quadrant. The patient was found to have an enlarged and inflamed appendix that was extending into the pelvis. There was  evidence of perforation.  The appendix was carefully dissected. A window was made in the mesoappendix at the base of the appendix. A harmonic scalpel was used across the mesoappendix. The appendix was divided at its base using an endo-GIA stapler. The cecum  Was inflamed and the base of the appendix was necrotic.  A second load of the GIA 45 stapler was used to fire across the base of the appendix.  Minimal appendiceal stump was left in place. There was no evidence of bleeding, leakage, or complication after division of the appendix. Irrigation was also performed and irrigate suctioned from the abdomen as well. Gregory [name] was used since there was evidence of oozing secondary to plavix. 54 F drain placed and brought out the inferior port site.  This was secured to the skin with 3 0 nylon.  The umbilical port site was closed using 0 vicryl pursestring sutures fashion at the level of the fascia. The trocar site skin wounds were closed using 4 0 monocyl and dermabond..  Instrument, sponge, and needle counts were correct at the conclusion of the case.   Findings: The appendix was found to be inflamed. There were signs of necrosis.  There was perforation. There was abscess formation.  Estimated Blood Loss:  less than 100 mL         Drains: 19 F         Total IV Fluids: 1095mL         Specimens: APPENDIX  Complications:  None; patient tolerated the procedure well.         Disposition: PACU - hemodynamically stable.         Condition: stable

## 2011-03-31 NOTE — Progress Notes (Signed)
1 Day Post-Op  Subjective:Awake in bed no complaints.  In Afib with PAC, and some abberant conduction. Sore but doesn't  feel to bad.  He's unaware of any irregularity.  Objective: Vital signs in last 24 hours: Temp:  [97.2 F (36.2 C)-99.1 F (37.3 C)] 97.8 F (36.6 C) (01/15 0710) Pulse Rate:  [52-119] 115  (01/15 0359) Resp:  [7-26] 15  (01/15 0359) BP: (118-212)/(50-111) 156/98 mmHg (01/15 0359) SpO2:  [75 %-100 %] 100 % (01/15 0359) Weight:  [90.719 kg (200 lb)] 90.719 kg (200 lb) (01/15 0359)    Intake/Output from previous day: 01/14 0701 - 01/15 0700 In: 1700 [I.V.:1700] Out: 365 [Urine:300; Drains:65] Intake/Output this shift:    PE:  Alert, NAD, talking with family.  Chest clear anteriorly, Abd- obese, slightly distended.  Tender, no bowel sounds, wounds from surgery look good.  No flatus   Lab Results:   Basename 03/31/11 0913 03/30/11 1715  WBC 20.6* 17.9*  HGB 14.5 15.5  HCT 43.0 44.6  PLT 203 227    BMET  Basename 03/30/11 1715  NA 138  K 5.4*  CL 98  CO2 28  GLUCOSE 104*  BUN 20  CREATININE 1.26  CALCIUM 9.6   PT/INR No results found for this basename: LABPROT:2,INR:2 in the last 72 hours   Studies/Results: Ct Abdomen Pelvis W Contrast  03/30/2011  *RADIOLOGY REPORT*  Clinical Data: Right lower quadrant abdominal pain.  CT ABDOMEN AND PELVIS WITH CONTRAST  Technique:  Multidetector CT imaging of the abdomen and pelvis was performed following the standard protocol during bolus administration of intravenous contrast.  Contrast:  100 ml Omnipaque-300  Comparison: None  Findings: The lung bases are clear.  No pleural effusion.  The liver is unremarkable.  No focal lesions or biliary dilatation. The gallbladder is normal.  No common bile duct dilatation.  The pancreas is normal.  The spleen is normal in size.  No focal lesions.  The adrenal glands and kidneys are unremarkable.  And upper pole left renal cyst is noted.  The stomach, duodenum, small bowel  and colon are unremarkable.  The appendix is distended and fluid-filled with surrounding inflammatory change consistent with acute appendicitis.  There is a proximal obstructing appendicolith.  The bladder, prostate gland and seminal vesicles are unremarkable. No pelvic mass, adenopathy or free pelvic fluid collections.  The aorta demonstrates moderate atherosclerotic calcifications.  The major branch vessels are patent.  No mesenteric or retroperitoneal masses or adenopathy.  A left inguinal hernia containing fat is noted.  The bony structures are unremarkable.  IMPRESSION: CT findings consistent with acute appendicitis.  Original Report Authenticated By: P. Kalman Jewels, M.D.    Anti-infectives: Anti-infectives     Start     Dose/Rate Route Frequency Ordered Stop   03/31/11 2200   ertapenem (INVANZ) 1 g in sodium chloride 0.9 % 50 mL IVPB        1 g 100 mL/hr over 30 Minutes Intravenous Daily at bedtime 03/31/11 0423     03/31/11 0430   ertapenem (INVANZ) 1 g in sodium chloride 0.9 % 50 mL IVPB  Status:  Discontinued        1 g 100 mL/hr over 30 Minutes Intravenous 60 min pre-op 03/31/11 0423 03/31/11 0426   03/30/11 2300   ertapenem (INVANZ) 1 g in sodium chloride 0.9 % 50 mL IVPB        1 g 100 mL/hr over 30 Minutes Intravenous  Once 03/30/11 2204 03/30/11 2340  Current Facility-Administered Medications  Medication Dose Route Frequency Provider Last Rate Last Dose  . carvedilol (COREG) tablet 3.125 mg  3.125 mg Oral BID WC Thomas A. Cornett, MD   3.125 mg at 03/31/11 1009  . dextrose 5 % and 0.9 % NaCl with KCl 20 mEq/L infusion   Intravenous Continuous Thomas A. Cornett, MD 100 mL/hr at 03/31/11 0800    . enoxaparin (LOVENOX) injection 40 mg  40 mg Subcutaneous Daily Thomas A. Cornett, MD      . ertapenem (INVANZ) 1 g in sodium chloride 0.9 % 50 mL IVPB  1 g Intravenous Once Leotis Shames, PA-C   1 g at 03/30/11 2340  . ertapenem (INVANZ) 1 g in sodium chloride 0.9 % 50 mL  IVPB  1 g Intravenous QHS Thomas A. Cornett, MD      . furosemide (LASIX) tablet 40 mg  40 mg Oral Daily Thomas A. Cornett, MD   40 mg at 03/31/11 1009  . HYDROmorphone (DILAUDID) 1 MG/ML injection           . HYDROmorphone (DILAUDID) 1 MG/ML injection           . HYDROmorphone (DILAUDID) injection 1 mg  1 mg Intravenous Q2H PRN Thomas A. Cornett, MD   1 mg at 03/31/11 0956  . iohexol (OMNIPAQUE) 300 MG/ML solution 100 mL  100 mL Intravenous Once PRN Medication Radiologist      . lisinopril (PRINIVIL,ZESTRIL) tablet 10 mg  10 mg Oral Daily Thomas A. Cornett, MD      . morphine 4 MG/ML injection 4 mg  4 mg Intravenous Once Evelina Bucy, MD   4 mg at 03/30/11 1946  . morphine 4 MG/ML injection 4 mg  4 mg Intravenous Once Leotis Shames, PA-C   4 mg at 03/30/11 2222  . ondansetron (ZOFRAN) injection 4 mg  4 mg Intravenous Once Evelina Bucy, MD   4 mg at 03/30/11 1942  . ondansetron (ZOFRAN) tablet 4 mg  4 mg Oral Q6H PRN Thomas A. Cornett, MD       Or  . ondansetron (ZOFRAN) injection 4 mg  4 mg Intravenous Q6H PRN Thomas A. Cornett, MD      . oxyCODONE-acetaminophen (PERCOCET) 5-325 MG per tablet 1-2 tablet  1-2 tablet Oral Q4H PRN Thomas A. Cornett, MD      . potassium chloride SA (K-DUR,KLOR-CON) CR tablet 20 mEq  20 mEq Oral Daily Thomas A. Cornett, MD   20 mEq at 03/31/11 1008  . simvastatin (ZOCOR) tablet 40 mg  40 mg Oral QPM Thomas A. Cornett, MD      . DISCONTD: bupivacaine-EPINEPHrine (MARCAINE W/ EPI) 0.5 % (with pres) injection    PRN Thomas A. Cornett, MD   10 mL at 03/30/11 2355  . DISCONTD: ertapenem (INVANZ) 1 g in sodium chloride 0.9 % 50 mL IVPB  1 g Intravenous 60 min Pre-Op Thomas A. Cornett, MD      . DISCONTD: hemostatic agents    PRN Joyice Faster. Cornett, MD   1 application at [id number]  . DISCONTD: HYDROmorphone (DILAUDID) injection 0.25-0.5 mg  0.25-0.5 mg Intravenous Q5 min PRN Warrick Parisian, MD   0.5 mg at 03/31/11 0217  . DISCONTD: labetalol (NORMODYNE,TRANDATE)  injection 5 mg  5 mg Intravenous Q10 min PRN Warrick Parisian, MD   10 mg at 03/31/11 0111  . DISCONTD: ondansetron (ZOFRAN) injection 4 mg  4 mg Intravenous Once PRN Warrick Parisian, MD      .  DISCONTD: sodium chloride irrigation 0.9 %    PRN Thomas A. Cornett, MD   1,000 mL at 03/30/11 2355   Facility-Administered Medications Ordered in Other Encounters  Medication Dose Route Frequency Provider Last Rate Last Dose  . DISCONTD: dexamethasone (DECADRON) injection    PRN Valda Lamb, CRNA   4 mg at 03/31/11 0002  . DISCONTD: droperidol (INAPSINE) injection    PRN Valda Lamb, CRNA   0.625 mg at 03/31/11 0002  . DISCONTD: esmolol (BREVIBLOC) injection    PRN Valda Lamb, CRNA   10 mg at 03/30/11 2335  . DISCONTD: lactated ringers infusion    Continuous PRN Valda Lamb, CRNA      . DISCONTD: ondansetron (ZOFRAN) injection    PRN Valda Lamb, CRNA   4 mg at 03/31/11 0002  . DISCONTD: propofol (DIPRIVAN) 10 MG/ML infusion    PRN Valda Lamb, CRNA   100 mg at 03/30/11 2329  . DISCONTD: rocuronium (ZEMURON) injection    PRN Valda Lamb, CRNA   35 mg at 03/30/11 2329  . DISCONTD: SUFentanil (SUFENTA) injection    PRN Valda Lamb, CRNA   10 mcg at 03/30/11 2345    Assessment/Plan      LOS: 1 day    Gregory [name] 03/31/2011 1 Day Post-Op  Subjective: UP in bed.  No complaints, abdomen tender, He is in Afib with PAC and some abberant conduction. He's not aware of anything.  He hasn't take meds for 3 days, due to N&V.  Objective: Vital signs in last 24 hours: Temp:  [97.2 F (36.2 C)-99.1 F (37.3 C)] 97.8 F (36.6 C) (01/15 0710) Pulse Rate:  [52-119] 115  (01/15 0359) Resp:  [7-26] 15  (01/15 0359) BP: (118-212)/(50-111) 156/98 mmHg (01/15 0359) SpO2:  [75 %-100 %] 100 % (01/15 0359) Weight:  [90.719 kg (200 lb)] 90.719 kg (200 lb) (01/15 0359)    Intake/Output from previous day: 01/14 0701 - 01/15 0700 In: 1700 [I.V.:1700] Out: 365 [Urine:300;  Drains:65] Intake/Output this shift:    Alert, up in bed, sitting on side of bed now.  Chest Clear Abd:    Lab Results:   Basename 03/31/11 0913 03/30/11 1715  WBC 20.6* 17.9*  HGB 14.5 15.5  HCT 43.0 44.6  PLT 203 227    BMET  Basename 03/30/11 1715  NA 138  K 5.4*  CL 98  CO2 28  GLUCOSE 104*  BUN 20  CREATININE 1.26  CALCIUM 9.6   PT/INR No results found for this basename: LABPROT:2,INR:2 in the last 72 hours   Studies/Results: Ct Abdomen Pelvis W Contrast  03/30/2011  *RADIOLOGY REPORT*  Clinical Data: Right lower quadrant abdominal pain.  CT ABDOMEN AND PELVIS WITH CONTRAST  Technique:  Multidetector CT imaging of the abdomen and pelvis was performed following the standard protocol during bolus administration of intravenous contrast.  Contrast:  100 ml Omnipaque-300  Comparison: None  Findings: The lung bases are clear.  No pleural effusion.  The liver is unremarkable.  No focal lesions or biliary dilatation. The gallbladder is normal.  No common bile duct dilatation.  The pancreas is normal.  The spleen is normal in size.  No focal lesions.  The adrenal glands and kidneys are unremarkable.  And upper pole left renal cyst is noted.  The stomach, duodenum, small bowel and colon are unremarkable.  The appendix is distended and fluid-filled with surrounding inflammatory change consistent with acute appendicitis.  There is a proximal obstructing appendicolith.  The bladder, prostate gland and seminal  vesicles are unremarkable. No pelvic mass, adenopathy or free pelvic fluid collections.  The aorta demonstrates moderate atherosclerotic calcifications.  The major branch vessels are patent.  No mesenteric or retroperitoneal masses or adenopathy.  A left inguinal hernia containing fat is noted.  The bony structures are unremarkable.  IMPRESSION: CT findings consistent with acute appendicitis.  Original Report Authenticated By: P. Kalman Jewels, M.D.     Anti-infectives: Anti-infectives     Start     Dose/Rate Route Frequency Ordered Stop   03/31/11 2200   ertapenem (INVANZ) 1 g in sodium chloride 0.9 % 50 mL IVPB        1 g 100 mL/hr over 30 Minutes Intravenous Daily at bedtime 03/31/11 0423     03/31/11 0430   ertapenem (INVANZ) 1 g in sodium chloride 0.9 % 50 mL IVPB  Status:  Discontinued        1 g 100 mL/hr over 30 Minutes Intravenous 60 min pre-op 03/31/11 0423 03/31/11 0426   03/30/11 2300   ertapenem (INVANZ) 1 g in sodium chloride 0.9 % 50 mL IVPB        1 g 100 mL/hr over 30 Minutes Intravenous  Once 03/30/11 2204 03/30/11 2340         Current Facility-Administered Medications  Medication Dose Route Frequency Provider Last Rate Last Dose  . carvedilol (COREG) tablet 3.125 mg  3.125 mg Oral BID WC Thomas A. Cornett, MD   3.125 mg at 03/31/11 1009  . dextrose 5 % and 0.9 % NaCl with KCl 20 mEq/L infusion   Intravenous Continuous Thomas A. Cornett, MD 100 mL/hr at 03/31/11 0800    . enoxaparin (LOVENOX) injection 40 mg  40 mg Subcutaneous Daily Thomas A. Cornett, MD      . ertapenem (INVANZ) 1 g in sodium chloride 0.9 % 50 mL IVPB  1 g Intravenous Once Leotis Shames, PA-C   1 g at 03/30/11 2340  . ertapenem (INVANZ) 1 g in sodium chloride 0.9 % 50 mL IVPB  1 g Intravenous QHS Thomas A. Cornett, MD      . furosemide (LASIX) tablet 40 mg  40 mg Oral Daily Thomas A. Cornett, MD   40 mg at 03/31/11 1009  . HYDROmorphone (DILAUDID) 1 MG/ML injection           . HYDROmorphone (DILAUDID) 1 MG/ML injection           . HYDROmorphone (DILAUDID) injection 1 mg  1 mg Intravenous Q2H PRN Thomas A. Cornett, MD   1 mg at 03/31/11 0956  . iohexol (OMNIPAQUE) 300 MG/ML solution 100 mL  100 mL Intravenous Once PRN Medication Radiologist      . lisinopril (PRINIVIL,ZESTRIL) tablet 10 mg  10 mg Oral Daily Thomas A. Cornett, MD      . morphine 4 MG/ML injection 4 mg  4 mg Intravenous Once Evelina Bucy, MD   4 mg at 03/30/11 1946  .  morphine 4 MG/ML injection 4 mg  4 mg Intravenous Once Leotis Shames, PA-C   4 mg at 03/30/11 2222  . ondansetron (ZOFRAN) injection 4 mg  4 mg Intravenous Once Evelina Bucy, MD   4 mg at 03/30/11 1942  . ondansetron (ZOFRAN) tablet 4 mg  4 mg Oral Q6H PRN Thomas A. Cornett, MD       Or  . ondansetron (ZOFRAN) injection 4 mg  4 mg Intravenous Q6H PRN Thomas A. Cornett, MD      . oxyCODONE-acetaminophen (PERCOCET) 5-325  MG per tablet 1-2 tablet  1-2 tablet Oral Q4H PRN Thomas A. Cornett, MD      . potassium chloride SA (K-DUR,KLOR-CON) CR tablet 20 mEq  20 mEq Oral Daily Thomas A. Cornett, MD   20 mEq at 03/31/11 1008  . simvastatin (ZOCOR) tablet 40 mg  40 mg Oral QPM Thomas A. Cornett, MD      . DISCONTD: bupivacaine-EPINEPHrine (MARCAINE W/ EPI) 0.5 % (with pres) injection    PRN Thomas A. Cornett, MD   10 mL at 03/30/11 2355  . DISCONTD: ertapenem (INVANZ) 1 g in sodium chloride 0.9 % 50 mL IVPB  1 g Intravenous 60 min Pre-Op Thomas A. Cornett, MD      . DISCONTD: hemostatic agents    PRN Joyice Faster. Cornett, MD   1 application at [id number]  . DISCONTD: HYDROmorphone (DILAUDID) injection 0.25-0.5 mg  0.25-0.5 mg Intravenous Q5 min PRN Warrick Parisian, MD   0.5 mg at 03/31/11 0217  . DISCONTD: labetalol (NORMODYNE,TRANDATE) injection 5 mg  5 mg Intravenous Q10 min PRN Warrick Parisian, MD   10 mg at 03/31/11 0111  . DISCONTD: ondansetron (ZOFRAN) injection 4 mg  4 mg Intravenous Once PRN Warrick Parisian, MD      . DISCONTD: sodium chloride irrigation 0.9 %    PRN Thomas A. Cornett, MD   1,000 mL at 03/30/11 2355   Facility-Administered Medications Ordered in Other Encounters  Medication Dose Route Frequency Provider Last Rate Last Dose  . DISCONTD: dexamethasone (DECADRON) injection    PRN Valda Lamb, CRNA   4 mg at 03/31/11 0002  . DISCONTD: droperidol (INAPSINE) injection    PRN Valda Lamb, CRNA   0.625 mg at 03/31/11 0002  . DISCONTD: esmolol (BREVIBLOC) injection     PRN Valda Lamb, CRNA   10 mg at 03/30/11 2335  . DISCONTD: lactated ringers infusion    Continuous PRN Valda Lamb, CRNA      . DISCONTD: ondansetron (ZOFRAN) injection    PRN Valda Lamb, CRNA   4 mg at 03/31/11 0002  . DISCONTD: propofol (DIPRIVAN) 10 MG/ML infusion    PRN Valda Lamb, CRNA   100 mg at 03/30/11 2329  . DISCONTD: rocuronium (ZEMURON) injection    PRN Valda Lamb, CRNA   35 mg at 03/30/11 2329  . DISCONTD: SUFentanil (SUFENTA) injection    PRN Valda Lamb, CRNA   10 mcg at 03/30/11 2345    Assessment/Plan Acute appendicitis, with peritonitis. 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 Rollene Fare) Cardiomyopathy EF 20-25% R CVA 2005 Non compliant with Coumadin, on plavix and asprin. Gerd Hx renal calculi bmi 28  I Plan:  He's back on PO meds except ASA, and plavix.  Will restart those.  Last EKG I can find 2011 he was in Afib also.Marland Kitchen "Will ask [facility] to see and help with care. Continue antibiotics, PPI, mobilize, IS. K= 5.4 this am on admit.  Will take K+ out of current IV and check K+ now and in AM       LOS: 1 day    Gregory [name] 03/31/2011

## 2011-03-31 NOTE — Transfer of Care (Signed)
Immediate Anesthesia Transfer of Care Note  Patient: Gregory [name]  Procedure(s) Performed:  APPENDECTOMY LAPAROSCOPIC  Patient Location: PACU  Anesthesia Type: General  Level of Consciousness: awake, oriented, patient cooperative and responds to stimulation  Airway & Oxygen Therapy: Patient Spontanous Breathing and Patient connected to face mask oxygen  Post-op Assessment: Report given to PACU RN, Post -op Vital signs reviewed and unstable, Anesthesiologist notified and Patient moving all extremities  Post vital signs: Reviewed and stable Filed Vitals:   03/30/11 1929  BP: 143/66  Pulse: 93  Temp: 36.8 C  Resp: 25    Complications: No apparent anesthesia complications

## 2011-03-31 NOTE — Anesthesia Postprocedure Evaluation (Signed)
  Anesthesia Post-op Note  Patient: Gregory [name]  Procedure(s) Performed:  APPENDECTOMY LAPAROSCOPIC  Patient Location: PACU  Anesthesia Type: General  Level of Consciousness: awake, sedated and patient cooperative  Airway and Oxygen Therapy: Patient Spontanous Breathing and Patient connected to nasal cannula oxygen  Post-op Pain: mild  Post-op Assessment: Post-op Vital signs reviewed, Patient's Cardiovascular Status Stable, Respiratory Function Stable, Patent Airway, No signs of Nausea or vomiting and Pain level controlled  Post-op Vital Signs: stable  Complications: No apparent anesthesia complications

## 2011-04-01 DIAGNOSIS — N289 Disorder of kidney and ureter, unspecified: Secondary | ICD-10-CM

## 2011-04-01 DIAGNOSIS — I4891 Unspecified atrial fibrillation: Secondary | ICD-10-CM

## 2011-04-01 LAB — CBC
MCHC: 33.1 g/dL (ref 30.0–36.0)
Platelets: 178 10*3/uL (ref 150–400)
RDW: 13.5 % (ref 11.5–15.5)
WBC: 17.9 10*3/uL — ABNORMAL HIGH (ref 4.0–10.5)

## 2011-04-01 LAB — BASIC METABOLIC PANEL
BUN: 37 mg/dL — ABNORMAL HIGH (ref 6–23)
Chloride: 100 mEq/L (ref 96–112)
GFR calc Af Amer: 35 mL/min — ABNORMAL LOW (ref >90)
GFR calc non Af Amer: 31 mL/min — ABNORMAL LOW (ref >90)
Potassium: 5.1 mEq/L (ref 3.5–5.1)
Sodium: 133 mEq/L — ABNORMAL LOW (ref 135–145)

## 2011-04-01 MED ORDER — MORPHINE SULFATE 2 MG/ML IJ SOLN
2.0000 mg | INTRAMUSCULAR | Status: DC | PRN
  Administered 2011-04-01 – 2011-04-02 (×5): 2 mg via INTRAVENOUS
  Filled 2011-04-01 (×5): qty 1

## 2011-04-01 MED ORDER — CLOPIDOGREL BISULFATE 75 MG PO TABS
75.0000 mg | ORAL_TABLET | Freq: Every day | ORAL | Status: DC
  Administered 2011-04-02 – 2011-04-03 (×2): 75 mg via ORAL
  Filled 2011-04-01 (×3): qty 1

## 2011-04-01 MED ORDER — MORPHINE SULFATE 2 MG/ML IJ SOLN
2.0000 mg | INTRAMUSCULAR | Status: DC | PRN
  Administered 2011-04-01: 2 mg via INTRAVENOUS
  Filled 2011-04-01: qty 1

## 2011-04-01 NOTE — Progress Notes (Signed)
+  flatus. No n/v. Tolerating clears. States pain meds don't last long Drain-serosang abd soft, mild distension  Adv diet in am Po pain meds Cont abx  Leighton Ruff. Redmond Pulling, MD, FACS General, Bariatric, & Minimally Invasive Surgery [facility], [place]

## 2011-04-01 NOTE — Progress Notes (Signed)
Pt. Has only urinated once this shift; bladder scanned only approximately 20 cc urine.  Will continue to monitor.  Pt. States he does not feel like he has to urinate at this time.  Mila Palmer 04/01/2011 3:43 AM

## 2011-04-01 NOTE — Progress Notes (Signed)
[facility]  DAILY PROGRESS NOTE   Subjective:  Feels okay, some abdominal pain. HR controlled in a-fib.  Objective:  Temp:  [97.8 F (36.6 C)-98.8 F (37.1 C)] 98.8 F (37.1 C) (01/16 0410) Pulse Rate:  [82-90] 90  (01/16 0410) Resp:  [10-14] 10  (01/16 0410) BP: (98-105)/(50-54) 98/51 mmHg (01/16 0410) SpO2:  [92 %-97 %] 92 % (01/16 0410) Weight change:   Intake/Output from previous day: 01/15 0701 - 01/16 0700 In: 800 [I.V.:800] Out: 100 [Urine:100]  Intake/Output from this shift:    Medications: Current Facility-Administered Medications  Medication Dose Route Frequency Provider Last Rate Last Dose  . 0.9 %  sodium chloride infusion   Intravenous Continuous Earnstine Regal, PA 100 mL/hr at 04/01/11 0702 100 mL/hr at 04/01/11 [id number]  . aspirin chewable tablet 81 mg  81 mg Oral Daily Earnstine Regal, [place]   81 mg at 03/31/11 1254  . carvedilol (COREG) tablet 3.125 mg  3.125 mg Oral BID WC Thomas A. Cornett, MD   3.125 mg at 03/31/11 1009  . enoxaparin (LOVENOX) injection 40 mg  40 mg Subcutaneous Daily Thomas A. Cornett, MD      . ertapenem (INVANZ) 1 g in sodium chloride 0.9 % 50 mL IVPB  1 g Intravenous QHS Thomas A. Cornett, MD   1 g at 03/31/11 2149  . furosemide (LASIX) tablet 40 mg  40 mg Oral Daily Thomas A. Cornett, MD   40 mg at 03/31/11 1009  . HYDROmorphone (DILAUDID) 1 MG/ML injection           . HYDROmorphone (DILAUDID) 1 MG/ML injection           . levalbuterol (XOPENEX) nebulizer solution 0.63 mg  0.63 mg Nebulization Q8H PRN Brett Canales, PA      . lisinopril (PRINIVIL,ZESTRIL) tablet 10 mg  10 mg Oral Daily Thomas A. Cornett, MD   10 mg at 03/31/11 1000  . metoprolol (LOPRESSOR) injection 5 mg  5 mg Intravenous Q6H PRN Earnstine Regal, PA      . morphine 2 MG/ML injection 2-4 mg  2-4 mg Intravenous Q2H PRN Imogene Burn. Tsuei, MD   2 mg at 04/01/11 0659  . ondansetron (ZOFRAN) tablet 4 mg  4 mg Oral Q6H PRN Thomas A. Cornett, MD        Or  . ondansetron (ZOFRAN) injection 4 mg  4 mg Intravenous Q6H PRN Thomas A. Cornett, MD      . oxyCODONE-acetaminophen (PERCOCET) 5-325 MG per tablet 1-2 tablet  1-2 tablet Oral Q4H PRN Thomas A. Cornett, MD   1 tablet at 04/01/11 0236  . pantoprazole (PROTONIX) injection 40 mg  40 mg Intravenous Q1200 Earnstine Regal, PA   40 mg at 03/31/11 1253  . potassium chloride SA (K-DUR,KLOR-CON) CR tablet 20 mEq  20 mEq Oral Daily Thomas A. Cornett, MD   20 mEq at 03/31/11 1008  . simvastatin (ZOCOR) tablet 40 mg  40 mg Oral QPM Thomas A. Cornett, MD   40 mg at 03/31/11 2022  . DISCONTD: dextrose 5 % and 0.9 % NaCl with KCl 20 mEq/L infusion   Intravenous Continuous Thomas A. Cornett, MD 100 mL/hr at 03/31/11 0800    . DISCONTD: HYDROmorphone (DILAUDID) injection 1 mg  1 mg Intravenous Q2H PRN Thomas A. Cornett, MD   1 mg at 03/31/11 2357    Physical Exam: General appearance: sleepy, no distress Neck: no adenopathy, no carotid bruit, no JVD, supple, symmetrical, trachea midline and  thyroid not enlarged, symmetric, no tenderness/mass/nodules Lungs: clear to auscultation bilaterally Heart: regular rate and rhythm, S1, S2 normal, no murmur, click, rub or gallop Abdomen: soft, non-tender; bowel sounds normal; no masses,  no organomegaly  Lab Results: Results for orders placed during the hospital encounter of 03/30/11 (from the past 48 hour(s))  CBC     Status: Abnormal   Collection Time   03/30/11  5:15 PM      Component Value Range Comment   WBC 17.9 (*) 4.0 - 10.5 (K/uL)    RBC 4.93  4.22 - 5.81 (MIL/uL)    Hemoglobin 15.5  13.0 - 17.0 (g/dL)    HCT 44.6  39.0 - 52.0 (%)    MCV 90.5  78.0 - 100.0 (fL)    MCH 31.4  26.0 - 34.0 (pg)    MCHC 34.8  30.0 - 36.0 (g/dL)    RDW 13.4  11.5 - 15.5 (%)    Platelets 227  150 - 400 (K/uL)   DIFFERENTIAL     Status: Abnormal   Collection Time   03/30/11  5:15 PM      Component Value Range Comment   Neutrophils Relative 73  43 - 77 (%)    Neutro Abs  13.0 (*) 1.7 - 7.7 (K/uL)    Lymphocytes Relative 15  12 - 46 (%)    Lymphs Abs 2.7  0.7 - 4.0 (K/uL)    Monocytes Relative 12  3 - 12 (%)    Monocytes Absolute 2.1 (*) 0.1 - 1.0 (K/uL)    Eosinophils Relative 0  0 - 5 (%)    Eosinophils Absolute 0.1  0.0 - 0.7 (K/uL)    Basophils Relative 0  0 - 1 (%)    Basophils Absolute 0.0  0.0 - 0.1 (K/uL)   COMPREHENSIVE METABOLIC PANEL     Status: Abnormal   Collection Time   03/30/11  5:15 PM      Component Value Range Comment   Sodium 138  135 - 145 (mEq/L)    Potassium 5.4 (*) 3.5 - 5.1 (mEq/L) HEMOLYZED SPECIMEN, RESULTS MAY BE AFFECTED   Chloride 98  96 - 112 (mEq/L)    CO2 28  19 - 32 (mEq/L)    Glucose, Bld 104 (*) 70 - 99 (mg/dL)    BUN 20  6 - 23 (mg/dL)    Creatinine, Ser 1.26  0.50 - 1.35 (mg/dL)    Calcium 9.6  8.4 - 10.5 (mg/dL)    Total Protein 7.8  6.0 - 8.3 (g/dL)    Albumin 3.6  3.5 - 5.2 (g/dL)    AST 26  0 - 37 (U/L) HEMOLYSIS AT THIS LEVEL MAY AFFECT RESULT   ALT 16  0 - 53 (U/L)    Alkaline Phosphatase 95  39 - 117 (U/L)    Total Bilirubin 0.9  0.3 - 1.2 (mg/dL)    GFR calc non Af Amer 54 (*) >90 (mL/min)    GFR calc Af Amer 62 (*) >90 (mL/min)   URINALYSIS, ROUTINE W REFLEX MICROSCOPIC     Status: Abnormal   Collection Time   03/30/11 10:24 PM      Component Value Range Comment   Color, Urine AMBER (*) YELLOW  BIOCHEMICALS MAY BE AFFECTED BY COLOR   APPearance CLEAR  CLEAR     Specific Gravity, Urine 1.038 (*) 1.005 - 1.030     pH 5.5  5.0 - 8.0     Glucose, UA NEGATIVE  NEGATIVE (mg/dL)  Hgb urine dipstick NEGATIVE  NEGATIVE     Bilirubin Urine SMALL (*) NEGATIVE     Ketones, ur 15 (*) NEGATIVE (mg/dL)    Protein, ur 30 (*) NEGATIVE (mg/dL)    Urobilinogen, UA 1.0  0.0 - 1.0 (mg/dL)    Nitrite NEGATIVE  NEGATIVE     Leukocytes, UA NEGATIVE  NEGATIVE    URINE MICROSCOPIC-ADD ON     Status: Abnormal   Collection Time   03/30/11 10:24 PM      Component Value Range Comment   Squamous Epithelial / LPF RARE   RARE     WBC, UA 0-2  <3 (WBC/hpf)    Bacteria, UA RARE  RARE     Casts HYALINE CASTS (*) NEGATIVE    MRSA PCR SCREENING     Status: Normal   Collection Time   03/31/11  4:32 AM      Component Value Range Comment   MRSA by PCR NEGATIVE  NEGATIVE    CBC     Status: Abnormal   Collection Time   03/31/11  9:13 AM      Component Value Range Comment   WBC 20.6 (*) 4.0 - 10.5 (K/uL)    RBC 4.66  4.22 - 5.81 (MIL/uL)    Hemoglobin 14.5  13.0 - 17.0 (g/dL)    HCT 43.0  39.0 - 52.0 (%)    MCV 92.3  78.0 - 100.0 (fL)    MCH 31.1  26.0 - 34.0 (pg)    MCHC 33.7  30.0 - 36.0 (g/dL)    RDW 13.4  11.5 - 15.5 (%)    Platelets 203  150 - 400 (K/uL)   COMPREHENSIVE METABOLIC PANEL     Status: Abnormal   Collection Time   03/31/11  9:13 AM      Component Value Range Comment   Sodium 137  135 - 145 (mEq/L)    Potassium 4.6  3.5 - 5.1 (mEq/L)    Chloride 99  96 - 112 (mEq/L)    CO2 26  19 - 32 (mEq/L)    Glucose, Bld 162 (*) 70 - 99 (mg/dL)    BUN 18  6 - 23 (mg/dL)    Creatinine, Ser 1.21  0.50 - 1.35 (mg/dL)    Calcium 8.7  8.4 - 10.5 (mg/dL)    Total Protein 7.1  6.0 - 8.3 (g/dL)    Albumin 3.1 (*) 3.5 - 5.2 (g/dL)    AST 17  0 - 37 (U/L)    ALT 13  0 - 53 (U/L)    Alkaline Phosphatase 91  39 - 117 (U/L)    Total Bilirubin 1.6 (*) 0.3 - 1.2 (mg/dL)    GFR calc non Af Amer 56 (*) >90 (mL/min)    GFR calc Af Amer 65 (*) >90 (mL/min)   CBC     Status: Abnormal   Collection Time   04/01/11  4:20 AM      Component Value Range Comment   WBC 17.9 (*) 4.0 - 10.5 (K/uL)    RBC 3.84 (*) 4.22 - 5.81 (MIL/uL)    Hemoglobin 11.8 (*) 13.0 - 17.0 (g/dL) DELTA CHECK NOTED   HCT 35.7 (*) 39.0 - 52.0 (%)    MCV 93.0  78.0 - 100.0 (fL)    MCH 30.7  26.0 - 34.0 (pg)    MCHC 33.1  30.0 - 36.0 (g/dL)    RDW 13.5  11.5 - 15.5 (%)    Platelets 178  150 - 400 (  K/uL)   BASIC METABOLIC PANEL     Status: Abnormal   Collection Time   04/01/11  4:20 AM      Component Value Range Comment   Sodium 133 (*) 135 -  145 (mEq/L)    Potassium 5.1  3.5 - 5.1 (mEq/L)    Chloride 100  96 - 112 (mEq/L)    CO2 25  19 - 32 (mEq/L)    Glucose, Bld 126 (*) 70 - 99 (mg/dL)    BUN 37 (*) 6 - 23 (mg/dL) DELTA CHECK NOTED   Creatinine, Ser 2.01 (*) 0.50 - 1.35 (mg/dL) DELTA CHECK NOTED   Calcium 8.0 (*) 8.4 - 10.5 (mg/dL)    GFR calc non Af Amer 31 (*) >90 (mL/min)    GFR calc Af Amer 35 (*) >90 (mL/min)   MAGNESIUM     Status: Normal   Collection Time   04/01/11  4:20 AM      Component Value Range Comment   Magnesium 1.8  1.5 - 2.5 (mg/dL)     Imaging: Ct Abdomen Pelvis W Contrast  03/30/2011  *RADIOLOGY REPORT*  Clinical Data: Right lower quadrant abdominal pain.  CT ABDOMEN AND PELVIS WITH CONTRAST  Technique:  Multidetector CT imaging of the abdomen and pelvis was performed following the standard protocol during bolus administration of intravenous contrast.  Contrast:  100 ml Omnipaque-300  Comparison: None  Findings: The lung bases are clear.  No pleural effusion.  The liver is unremarkable.  No focal lesions or biliary dilatation. The gallbladder is normal.  No common bile duct dilatation.  The pancreas is normal.  The spleen is normal in size.  No focal lesions.  The adrenal glands and kidneys are unremarkable.  And upper pole left renal cyst is noted.  The stomach, duodenum, small bowel and colon are unremarkable.  The appendix is distended and fluid-filled with surrounding inflammatory change consistent with acute appendicitis.  There is a proximal obstructing appendicolith.  The bladder, prostate gland and seminal vesicles are unremarkable. No pelvic mass, adenopathy or free pelvic fluid collections.  The aorta demonstrates moderate atherosclerotic calcifications.  The major branch vessels are patent.  No mesenteric or retroperitoneal masses or adenopathy.  A left inguinal hernia containing fat is noted.  The bony structures are unremarkable.  IMPRESSION: CT findings consistent with acute appendicitis.  Original  Report Authenticated By: P. Kalman Jewels, M.D.    Assessment:  1. Active Problems: 2.  Appendicitis with abscess 3.  CAD (coronary artery disease) 4.  PAF (paroxysmal atrial fibrillation) 5.  Cardiomyopathy, ischemic 6.  CVA (cerebral vascular accident) 7.  GERD (gastroesophageal reflux disease) 8.  Renal calculi 9.  History of tobacco use 10.   Plan:  1. A-fib appears rate controlled. Scheduled carvedilol, lisiniopril, furosemide, aspirin and zocor. Awaiting restart of plavix. No active cardiac issues. Tolerated surgery very well.  Would hold lasix today due to lower bp and resume tomorrow if bp improves.   Time Spent Directly with Patient:  15 minutes  Length of Stay:  LOS: 2 days   Pixie Casino, MD Attending Cardiologist [facility] 04/01/2011, 8:33 AM

## 2011-04-01 NOTE — Progress Notes (Signed)
UR of chart completed.  

## 2011-04-01 NOTE — Progress Notes (Signed)
Gregory Kayes M. [name], MD, FACS General, Bariatric, & Minimally Invasive Surgery [facility]  

## 2011-04-01 NOTE — Progress Notes (Signed)
2 Days Post-Op  Subjective:Awake in bed, complains of being sore.  Incision looks good.  Some flatus, on Clear liquids, no nausea.  Hasn't done more than sit on side of bed.  Drain showing bloody serous drainage. Blood is old, WBC still up.     Objective: Vital signs in last 24 hours: Temp:  [97.8 F (36.6 C)-98.8 F (37.1 C)] 98.8 F (37.1 C) (01/16 0410) Pulse Rate:  [82-90] 90  (01/16 0410) Resp:  [10-14] 10  (01/16 0410) BP: (98-105)/(50-54) 98/51 mmHg (01/16 0410) SpO2:  [92 %-97 %] 92 % (01/16 0410)    Intake/Output from previous day: 01/15 0701 - 01/16 0700 In: 800 [I.V.:800] Out: 100 [Urine:100] Intake/Output this shift: Total I/O In: -  Out: 45 [Drains:45]  PE:  Alert, NAD, talking with family.  Chest clear anteriorly, Abd- obese, slightly distended.  Tender, no bowel sounds, wounds from surgery look good. Some flatus Lab Results:   Basename 04/01/11 0420 03/31/11 0913  WBC 17.9* 20.6*  HGB 11.8* 14.5  HCT 35.7* 43.0  PLT 178 203    BMET  Basename 04/01/11 0420 03/31/11 0913  NA 133* 137  K 5.1 4.6  CL 100 99  CO2 25 26  GLUCOSE 126* 162*  BUN 37* 18  CREATININE 2.01* 1.21  CALCIUM 8.0* 8.7   PT/INR No results found for this basename: LABPROT:2,INR:2 in the last 72 hours   Studies/Results: Ct Abdomen Pelvis W Contrast  03/30/2011  *RADIOLOGY REPORT*  Clinical Data: Right lower quadrant abdominal pain.  CT ABDOMEN AND PELVIS WITH CONTRAST  Technique:  Multidetector CT imaging of the abdomen and pelvis was performed following the standard protocol during bolus administration of intravenous contrast.  Contrast:  100 ml Omnipaque-300  Comparison: None  Findings: The lung bases are clear.  No pleural effusion.  The liver is unremarkable.  No focal lesions or biliary dilatation. The gallbladder is normal.  No common bile duct dilatation.  The pancreas is normal.  The spleen is normal in size.  No focal lesions.  The adrenal glands and kidneys are  unremarkable.  And upper pole left renal cyst is noted.  The stomach, duodenum, small bowel and colon are unremarkable.  The appendix is distended and fluid-filled with surrounding inflammatory change consistent with acute appendicitis.  There is a proximal obstructing appendicolith.  The bladder, prostate gland and seminal vesicles are unremarkable. No pelvic mass, adenopathy or free pelvic fluid collections.  The aorta demonstrates moderate atherosclerotic calcifications.  The major branch vessels are patent.  No mesenteric or retroperitoneal masses or adenopathy.  A left inguinal hernia containing fat is noted.  The bony structures are unremarkable.  IMPRESSION: CT findings consistent with acute appendicitis.  Original Report Authenticated By: P. Kalman Jewels, M.D.    Anti-infectives: Anti-infectives     Start     Dose/Rate Route Frequency Ordered Stop   03/31/11 2200   ertapenem (INVANZ) 1 g in sodium chloride 0.9 % 50 mL IVPB        1 g 100 mL/hr over 30 Minutes Intravenous Daily at bedtime 03/31/11 0423     03/31/11 0430   ertapenem (INVANZ) 1 g in sodium chloride 0.9 % 50 mL IVPB  Status:  Discontinued        1 g 100 mL/hr over 30 Minutes Intravenous 60 min pre-op 03/31/11 0423 03/31/11 0426   03/30/11 2300   ertapenem (INVANZ) 1 g in sodium chloride 0.9 % 50 mL IVPB  1 g 100 mL/hr over 30 Minutes Intravenous  Once 03/30/11 2204 03/30/11 2340         Current Facility-Administered Medications  Medication Dose Route Frequency Provider Last Rate Last Dose  . 0.9 %  sodium chloride infusion   Intravenous Continuous Earnstine Regal, PA 100 mL/hr at 04/01/11 0702 100 mL/hr at 04/01/11 [id number]  . aspirin chewable tablet 81 mg  81 mg Oral Daily Earnstine Regal, [place]   81 mg at 03/31/11 1254  . carvedilol (COREG) tablet 3.125 mg  3.125 mg Oral BID WC Thomas A. Cornett, MD   3.125 mg at 03/31/11 1009  . enoxaparin (LOVENOX) injection 40 mg  40 mg Subcutaneous Daily Thomas A. Cornett, MD       . ertapenem (INVANZ) 1 g in sodium chloride 0.9 % 50 mL IVPB  1 g Intravenous QHS Thomas A. Cornett, MD   1 g at 03/31/11 2149  . HYDROmorphone (DILAUDID) 1 MG/ML injection           . HYDROmorphone (DILAUDID) 1 MG/ML injection           . levalbuterol (XOPENEX) nebulizer solution 0.63 mg  0.63 mg Nebulization Q8H PRN Brett Canales, PA      . lisinopril (PRINIVIL,ZESTRIL) tablet 10 mg  10 mg Oral Daily Thomas A. Cornett, MD   10 mg at 03/31/11 1000  . metoprolol (LOPRESSOR) injection 5 mg  5 mg Intravenous Q6H PRN Earnstine Regal, PA      . morphine 2 MG/ML injection 2-4 mg  2-4 mg Intravenous Q2H PRN Imogene Burn. Tsuei, MD   2 mg at 04/01/11 0659  . ondansetron (ZOFRAN) tablet 4 mg  4 mg Oral Q6H PRN Thomas A. Cornett, MD       Or  . ondansetron (ZOFRAN) injection 4 mg  4 mg Intravenous Q6H PRN Thomas A. Cornett, MD      . oxyCODONE-acetaminophen (PERCOCET) 5-325 MG per tablet 1-2 tablet  1-2 tablet Oral Q4H PRN Thomas A. Cornett, MD   1 tablet at 04/01/11 0236  . pantoprazole (PROTONIX) injection 40 mg  40 mg Intravenous Q1200 Earnstine Regal, PA   40 mg at 03/31/11 1253  . potassium chloride SA (K-DUR,KLOR-CON) CR tablet 20 mEq  20 mEq Oral Daily Thomas A. Cornett, MD   20 mEq at 03/31/11 1008  . simvastatin (ZOCOR) tablet 40 mg  40 mg Oral QPM Thomas A. Cornett, MD   40 mg at 03/31/11 2022  . DISCONTD: dextrose 5 % and 0.9 % NaCl with KCl 20 mEq/L infusion   Intravenous Continuous Thomas A. Cornett, MD 100 mL/hr at 03/31/11 0800    . DISCONTD: furosemide (LASIX) tablet 40 mg  40 mg Oral Daily Thomas A. Cornett, MD   40 mg at 03/31/11 1009  . DISCONTD: HYDROmorphone (DILAUDID) injection 1 mg  1 mg Intravenous Q2H PRN Thomas A. Cornett, MD   1 mg at 03/31/11 2357    Assessment/Plan      LOS: 2 days    Gregory [name] 04/01/2011 2 Days Post-Op  Subjective: UP in bed.  No complaints, abdomen tender, He is in Afib with PAC and some abberant conduction. He's not aware of anything.  He  hasn't take meds for 3 days, due to N&V.  Objective: Vital signs in last 24 hours: Temp:  [97.8 F (36.6 C)-98.8 F (37.1 C)] 98.8 F (37.1 C) (01/16 0410) Pulse Rate:  [82-90] 90  (01/16 0410) Resp:  [10-14] 10  (01/16 0410) BP: (98-105)/(50-54) 98/51 mmHg (  01/16 0410) SpO2:  [92 %-97 %] 92 % (01/16 0410)    Intake/Output from previous day: 01/15 0701 - 01/16 0700 In: 800 [I.V.:800] Out: 100 [Urine:100] Intake/Output this shift: Total I/O In: -  Out: 45 [Drains:45]  Alert, up in bed, sitting on side of bed now.  Chest Clear Abd:    Lab Results:   Basename 04/01/11 0420 03/31/11 0913  WBC 17.9* 20.6*  HGB 11.8* 14.5  HCT 35.7* 43.0  PLT 178 203    BMET  Basename 04/01/11 0420 03/31/11 0913  NA 133* 137  K 5.1 4.6  CL 100 99  CO2 25 26  GLUCOSE 126* 162*  BUN 37* 18  CREATININE 2.01* 1.21  CALCIUM 8.0* 8.7   PT/INR No results found for this basename: LABPROT:2,INR:2 in the last 72 hours   Studies/Results: Ct Abdomen Pelvis W Contrast  03/30/2011  *RADIOLOGY REPORT*  Clinical Data: Right lower quadrant abdominal pain.  CT ABDOMEN AND PELVIS WITH CONTRAST  Technique:  Multidetector CT imaging of the abdomen and pelvis was performed following the standard protocol during bolus administration of intravenous contrast.  Contrast:  100 ml Omnipaque-300  Comparison: None  Findings: The lung bases are clear.  No pleural effusion.  The liver is unremarkable.  No focal lesions or biliary dilatation. The gallbladder is normal.  No common bile duct dilatation.  The pancreas is normal.  The spleen is normal in size.  No focal lesions.  The adrenal glands and kidneys are unremarkable.  And upper pole left renal cyst is noted.  The stomach, duodenum, small bowel and colon are unremarkable.  The appendix is distended and fluid-filled with surrounding inflammatory change consistent with acute appendicitis.  There is a proximal obstructing appendicolith.  The bladder, prostate gland  and seminal vesicles are unremarkable. No pelvic mass, adenopathy or free pelvic fluid collections.  The aorta demonstrates moderate atherosclerotic calcifications.  The major branch vessels are patent.  No mesenteric or retroperitoneal masses or adenopathy.  A left inguinal hernia containing fat is noted.  The bony structures are unremarkable.  IMPRESSION: CT findings consistent with acute appendicitis.  Original Report Authenticated By: P. Kalman Jewels, M.D.    Anti-infectives: Anti-infectives     Start     Dose/Rate Route Frequency Ordered Stop   03/31/11 2200   ertapenem (INVANZ) 1 g in sodium chloride 0.9 % 50 mL IVPB        1 g 100 mL/hr over 30 Minutes Intravenous Daily at bedtime 03/31/11 0423     03/31/11 0430   ertapenem (INVANZ) 1 g in sodium chloride 0.9 % 50 mL IVPB  Status:  Discontinued        1 g 100 mL/hr over 30 Minutes Intravenous 60 min pre-op 03/31/11 0423 03/31/11 0426   03/30/11 2300   ertapenem (INVANZ) 1 g in sodium chloride 0.9 % 50 mL IVPB        1 g 100 mL/hr over 30 Minutes Intravenous  Once 03/30/11 2204 03/30/11 2340         Current Facility-Administered Medications  Medication Dose Route Frequency Provider Last Rate Last Dose  . 0.9 %  sodium chloride infusion   Intravenous Continuous Earnstine Regal, PA 100 mL/hr at 04/01/11 0702 100 mL/hr at 04/01/11 [id number]  . aspirin chewable tablet 81 mg  81 mg Oral Daily Earnstine Regal, [place]   81 mg at 03/31/11 1254  . carvedilol (COREG) tablet 3.125 mg  3.125 mg Oral BID WC Thomas A. Cornett, MD  3.125 mg at 03/31/11 1009  . enoxaparin (LOVENOX) injection 40 mg  40 mg Subcutaneous Daily Thomas A. Cornett, MD      . ertapenem (INVANZ) 1 g in sodium chloride 0.9 % 50 mL IVPB  1 g Intravenous QHS Thomas A. Cornett, MD   1 g at 03/31/11 2149  . HYDROmorphone (DILAUDID) 1 MG/ML injection           . HYDROmorphone (DILAUDID) 1 MG/ML injection           . levalbuterol (XOPENEX) nebulizer solution 0.63 mg  0.63 mg  Nebulization Q8H PRN Brett Canales, PA      . lisinopril (PRINIVIL,ZESTRIL) tablet 10 mg  10 mg Oral Daily Thomas A. Cornett, MD   10 mg at 03/31/11 1000  . metoprolol (LOPRESSOR) injection 5 mg  5 mg Intravenous Q6H PRN Earnstine Regal, PA      . morphine 2 MG/ML injection 2-4 mg  2-4 mg Intravenous Q2H PRN Imogene Burn. Tsuei, MD   2 mg at 04/01/11 0659  . ondansetron (ZOFRAN) tablet 4 mg  4 mg Oral Q6H PRN Thomas A. Cornett, MD       Or  . ondansetron (ZOFRAN) injection 4 mg  4 mg Intravenous Q6H PRN Thomas A. Cornett, MD      . oxyCODONE-acetaminophen (PERCOCET) 5-325 MG per tablet 1-2 tablet  1-2 tablet Oral Q4H PRN Thomas A. Cornett, MD   1 tablet at 04/01/11 0236  . pantoprazole (PROTONIX) injection 40 mg  40 mg Intravenous Q1200 Earnstine Regal, PA   40 mg at 03/31/11 1253  . potassium chloride SA (K-DUR,KLOR-CON) CR tablet 20 mEq  20 mEq Oral Daily Thomas A. Cornett, MD   20 mEq at 03/31/11 1008  . simvastatin (ZOCOR) tablet 40 mg  40 mg Oral QPM Thomas A. Cornett, MD   40 mg at 03/31/11 2022  . DISCONTD: dextrose 5 % and 0.9 % NaCl with KCl 20 mEq/L infusion   Intravenous Continuous Thomas A. Cornett, MD 100 mL/hr at 03/31/11 0800    . DISCONTD: furosemide (LASIX) tablet 40 mg  40 mg Oral Daily Thomas A. Cornett, MD   40 mg at 03/31/11 1009  . DISCONTD: HYDROmorphone (DILAUDID) injection 1 mg  1 mg Intravenous Q2H PRN Thomas A. Cornett, MD   1 mg at 03/31/11 2357    Assessment/Plan Acute appendicitis, with peritonitis. 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 Rollene Fare) Cardiomyopathy EF 20-25%, now somewhat better. R CVA 2005 Non compliant with Coumadin, on plavix and asprin. Gerd Hx renal calculi bmi 28 creatinine is up to  2.01 today. BP is down, lasix on hold by [facility] I Plan:  He's back on PO meds except ASA, and plavix.  Will restart those. He is still in Afib, but controlled rate.  On lasix and lisinopril for his CHF, WILL hold for now.  Continue NS at 113ml/hr.  Mobilize.    LOS: 2 days    Gregory [name] 04/01/2011

## 2011-04-02 ENCOUNTER — Encounter (HOSPITAL_COMMUNITY): Payer: Self-pay | Admitting: Cardiology

## 2011-04-02 LAB — COMPREHENSIVE METABOLIC PANEL
ALT: 15 U/L (ref 0–53)
AST: 24 U/L (ref 0–37)
Albumin: 2.7 g/dL — ABNORMAL LOW (ref 3.5–5.2)
CO2: 26 mEq/L (ref 19–32)
Calcium: 8.1 mg/dL — ABNORMAL LOW (ref 8.4–10.5)
GFR calc non Af Amer: 55 mL/min — ABNORMAL LOW (ref >90)
Sodium: 135 mEq/L (ref 135–145)
Total Protein: 6.2 g/dL (ref 6.0–8.3)

## 2011-04-02 LAB — CBC
MCH: 30.3 pg (ref 26.0–34.0)
Platelets: 180 10*3/uL (ref 150–400)
RBC: 3.76 MIL/uL — ABNORMAL LOW (ref 4.22–5.81)
RDW: 13.6 % (ref 11.5–15.5)

## 2011-04-02 MED ORDER — FUROSEMIDE 40 MG PO TABS
40.0000 mg | ORAL_TABLET | Freq: Every day | ORAL | Status: DC
  Administered 2011-04-03: 40 mg via ORAL
  Filled 2011-04-02: qty 1

## 2011-04-02 MED ORDER — LEVALBUTEROL HCL 0.63 MG/3ML IN NEBU
0.6300 mg | INHALATION_SOLUTION | Freq: Four times a day (QID) | RESPIRATORY_TRACT | Status: AC
  Administered 2011-04-02 (×2): 0.63 mg via RESPIRATORY_TRACT
  Filled 2011-04-02 (×3): qty 3

## 2011-04-02 MED ORDER — CARVEDILOL 6.25 MG PO TABS
6.2500 mg | ORAL_TABLET | Freq: Two times a day (BID) | ORAL | Status: DC
  Administered 2011-04-02 – 2011-04-03 (×2): 6.25 mg via ORAL
  Filled 2011-04-02 (×4): qty 1

## 2011-04-02 MED ORDER — PANTOPRAZOLE SODIUM 40 MG PO TBEC
40.0000 mg | DELAYED_RELEASE_TABLET | Freq: Every day | ORAL | Status: DC
  Administered 2011-04-03: 40 mg via ORAL
  Filled 2011-04-02: qty 1

## 2011-04-02 NOTE — Progress Notes (Signed)
Spoke with pt and family about Occupational Therapy.  Pt has been walking in halls with family and doing most adls in room with very little assist.  Pt is sore with movement but is able to do adls.  Pt and family do not feel OT is needed at this time.  Will sign off. Jinger Neighbors, [place]  [id number]

## 2011-04-02 NOTE — Progress Notes (Addendum)
Subjective: No chest pain. No specific SOB, he states surgical pain makes him feel SOB, due to the pain. SOB better after nebs + Ambulation, taking PO, UOP picked up with IVF Pain control difficult (requiring meds) + flatus, & + BM this AM.  Objective: Vital signs in last 24 hours: Temp:  [98 F (36.7 C)-98.3 F (36.8 C)] 98 F (36.7 C) (01/17 0538) Pulse Rate:  [81-84] 81  (01/17 0744) Resp:  [16] 16  (01/17 0538) BP: (107-144)/(47-71) 144/70 mmHg (01/17 0744) SpO2:  [90 %-96 %] 95 % (01/17 0538) Weight change:    Intake/Output from previous day: +2141 01/16 0701 - 01/17 0700 In: 2796.7 [I.V.:2751.7] Out: 655 [Urine:600; Drains:55] Intake/Output this shift:   PE: General:A&O, pleasant affect.   Heart: S1S2, RRR Lungs:few crackles scattered. Abd;+ BS, + drain in place. Ext:no edema.  Lab Results:  [facility] 04/02/11 0637 04/01/11 0420  WBC 11.8* 17.9*  HGB 11.4* 11.8*  HCT 34.8* 35.7*  PLT 180 178   BMET  Basename 04/02/11 0637 04/01/11 0420  NA 135 133*  K 4.8 5.1  CL 101 100  CO2 26 25  GLUCOSE 91 126*  BUN 28* 37*  CREATININE 1.24 2.01*  CALCIUM 8.1* 8.0*       Hepatic Function Panel  Basename 04/02/11 0637  PROT 6.2  ALBUMIN 2.7*  AST 24  ALT 15  ALKPHOS 99  BILITOT 0.5  BILIDIR --  IBILI --   No results found for this basename: CHOL in the last 72 hours No results found for this basename: PROTIME in the last 72 hours  Telemetry:  Afib - rate controlled  Studies/Results: No results found.  Medications: I have reviewed the patient's current medications.    Marland Kitchen aspirin  81 mg Oral Daily  . carvedilol  3.125 mg Oral BID WC  . clopidogrel  75 mg Oral Q breakfast  . enoxaparin  40 mg Subcutaneous Daily  . ertapenem (INVANZ) IV  1 g Intravenous QHS  . levalbuterol  0.63 mg Nebulization Q6H  . pantoprazole (PROTONIX) IV  40 mg Intravenous Q1200  . simvastatin  40 mg Oral QPM   Assessment/Plan: Patient Active Problem List  Diagnoses  .  Appendicitis with abscess  . CAD (coronary artery disease),CABG 1993-LIMA to the LAD, SVG to Om and SVG to PDA/PLA, patent on cath 2008.  Marland Kitchen Chronic a-fib  . Cardiomyopathy, ischemic  . CVA (cerebral vascular accident)  . GERD (gastroesophageal reflux disease)  . Renal calculi  . History of tobacco use  Chronic Atrial fib.  Rate controlled not on coumadin secondary to side effects, on ASA/Plavix only.  Resume Plavix as soon as possible.  S./P Laparoscopic appendectomy  Post op day 3. Cr. Improved  Consider decreasing IV fluids, pt is +>2000.  His lasix has been on hold.  LOS: 3 days   Gregory [name],LAURA R 04/02/2011, 8:22 AM  I have seen and examined the patient along with Cecilie Kicks, NP.  I have reviewed the chart, notes and new data.  I agree with Laura's note.  Key new complaints: Post-op pain Key examination changes: lung exam with diffuse ? Crackles vs rales Key new findings / data: Rate controlled Afib.  Cr improved -- suspect that this was Pre-renal due to dehydration (decreased PO intake & inflammation)  Stable from a cardiac standpoint.  NO CHF findings & Chronic Afib is rate controlled.  PLAN: Agree with decreasing IVF & restarting Lasix to avoid volume overload.  BP now stable. Can increase BB  dose to 6.25 mg & if BP stable tomorrow - restart ACE-I @ 1/2 home dose (5mg ) tomorrow if BP & Cr back to baseline. Agree with nebs & pulm toilet / IS. Back on ASA/Plavix  - will need to discuss with Primary Cardiologist re ? Pradaxa / Xarelto instead of ASA/Plavix for stroke prevention.   Leonie Man, M.D., M.S. [facility] [address] [place], [place]  [address]  [phone]  04/02/2011 11:12 AM

## 2011-04-02 NOTE — Progress Notes (Signed)
Looks good. States he is sore.  No n/v. +BM. Tolerated solids for lunch.  Will decrease IVF Follow cards recs Poss home Friday  Drain teaching.   Gregory [name]. Redmond Pulling, MD, FACS General, Bariatric, & Minimally Invasive Surgery [facility], [place]

## 2011-04-02 NOTE — Progress Notes (Signed)
PT Cancellation/ Discharge Note  Treatment cancelled today due to pt reports walking throughout halls independently with no assistive device since yesterday.   Pt declined acute PT Evaluation stating that he does not have any need. Pt's wife and daughter were both present and confirmed. Pt not needing PT eval.  Acute PT signing off at pt's request.    Gregory [name] 04/02/2011, 11:50 AM Gregory [name] DPT [phone]

## 2011-04-02 NOTE — Progress Notes (Signed)
3 Days Post-Op  Subjective: Pt ok. Passing flatus. No CP or SOB, but having some exp wheezes. Voiding on own fine. Has been OOB walking a good bit.  Objective: Vital signs in last 24 hours: Temp:  [98 F (36.7 C)-98.3 F (36.8 C)] 98 F (36.7 C) (01/17 0538) Pulse Rate:  [81-84] 81  (01/17 0744) Resp:  [16] 16  (01/17 0538) BP: (107-144)/(47-71) 144/70 mmHg (01/17 0744) SpO2:  [90 %-96 %] 95 % (01/17 0538)    Intake/Output this shift:    Physical Exam: BP 144/70  Pulse 81  Temp(Src) 98 F (36.7 C) (Oral)  Resp 16  Ht 5\' 10"  (1.778 m)  Wt 90.719 kg (200 lb)  BMI 28.70 kg/m2  SpO2 95% Lungs: exp wheezes bilat, no rales Abdomen: soft, ND, NT Incision c/d/i, drain intact, SS output  Labs: CBC  Basename 04/02/11 0637 04/01/11 0420  WBC 11.8* 17.9*  HGB 11.4* 11.8*  HCT 34.8* 35.7*  PLT 180 178   BMET  Basename 04/02/11 0637 04/01/11 0420  NA 135 133*  K 4.8 5.1  CL 101 100  CO2 26 25  GLUCOSE 91 126*  BUN 28* 37*  CREATININE 1.24 2.01*  CALCIUM 8.1* 8.0*   LFT  Basename 04/02/11 0637  PROT 6.2  ALBUMIN 2.7*  AST 24  ALT 15  ALKPHOS 99  BILITOT 0.5  BILIDIR --  IBILI --  LIPASE --   PT/INR No results found for this basename: LABPROT:2,INR:2 in the last 72 hours ABG No results found for this basename: PHART:2,PCO2:2,PO2:2,HCO3:2 in the last 72 hours  Studies/Results: No results found.  Assessment: Active Problems:  Appendicitis with abscess  CAD (coronary artery disease)  PAF (paroxysmal atrial fibrillation)  Cardiomyopathy, ischemic  CVA (cerebral vascular accident)  GERD (gastroesophageal reflux disease)  Renal calculi  History of tobacco use   Procedure(s): APPENDECTOMY LAPAROSCOPIC  Plan: WBC down, Cr back down. Will advance diet Continue OOB Will order some nebs and encourage IS use.  LOS: 3 days    Gregory [name] 04/02/2011

## 2011-04-02 NOTE — Clinical Documentation Improvement (Signed)
RENAL FAILURE DOCUMENTATION CLARIFICATION QUERY  THIS DOCUMENT IS NOT A PERMANENT PART OF THE MEDICAL RECORD  TO RESPOND TO THE THIS QUERY, FOLLOW THE INSTRUCTIONS BELOW:  1. If needed, update documentation for the patient's encounter via the notes activity.  2. Access this query again and click edit on the In [employer].  3. After updating, or not, click F2 to complete all highlighted (required) fields concerning your review. Select "additional documentation in the medical record" OR "no additional documentation provided".  4. Click Sign note button.  5. The deficiency will fall out of your In Basket *Please let us know if you are not able to complete this workflow by phone or e-mail (listed below).  Please update your documentation within the medical record to reflect your response to this query.                                                                                    04/02/11  Dear Dr. Darene Lamer. Gregory [name]  In a better effort to capture your patient's severity of illness, reflect appropriate length of stay and utilization of resources, a review of the patient medical record has revealed the following indicators.    Based on your clinical judgment, please clarify and document in a progress note and/or discharge summary the clinical condition associated with the following supporting information:  In responding to this query please exercise your independent judgment.  The fact that a query is asked, does not imply that any particular answer is desired or expected.  Please clarify 2nd diagnosis associated with elevated BUN 37 (1/16) 28 ( 1/17)  18 (1/15) with elevated Crt. 2.01 ( 1/16) and 1.21 (1/15) if appropriate.  Thank  You   Possible Clinical Conditions?  _______Acute Renal Failure  _______Acute Kidney Injury  _______Acute on Chronic Renal Failure   ______Other Condition_____________  _______Cannot Clinically Determine     Supporting Information:  Risk  Factors: Acute appendicitis s/p appendectomy  Signs and Symptoms: 1/15 100 cc doc for 8hr shift?    Lab:  Baseline Cr Level : see above   Current Creatinine Level (trend):  See above  Baseline BUN Level :     See above     Current BUN Level: (trend) see above     GFR:  56 (1/15), 31 ( 1/16) , 55 ( 1/17)  Treatments: D5 1/2 ns w 20kcl @ 142ml/hr then NS @ 146ml/hr till 1/17                    You may use possible, probable, or suspect with inpatient documentation. possible, probable, suspected diagnoses MUST be documented at the time of discharge  Reviewed:  no additional documentation provided  Thank You,  [place]  Clinical Documentation Specialist RN,BSN, CDS:  Pager [phone]  Health Information Management [place]

## 2011-04-03 LAB — CBC
HCT: 35.3 % — ABNORMAL LOW (ref 39.0–52.0)
Hemoglobin: 11.8 g/dL — ABNORMAL LOW (ref 13.0–17.0)
MCH: 30.7 pg (ref 26.0–34.0)
MCV: 91.9 fL (ref 78.0–100.0)
RBC: 3.84 MIL/uL — ABNORMAL LOW (ref 4.22–5.81)
WBC: 7.3 10*3/uL (ref 4.0–10.5)

## 2011-04-03 LAB — BASIC METABOLIC PANEL
BUN: 19 mg/dL (ref 6–23)
CO2: 27 mEq/L (ref 19–32)
Calcium: 8.3 mg/dL — ABNORMAL LOW (ref 8.4–10.5)
Chloride: 102 mEq/L (ref 96–112)
Creatinine, Ser: 0.92 mg/dL (ref 0.50–1.35)
Glucose, Bld: 102 mg/dL — ABNORMAL HIGH (ref 70–99)

## 2011-04-03 MED ORDER — HYDROCODONE-ACETAMINOPHEN 5-325 MG PO TABS: 1.0000 | ORAL_TABLET | ORAL | Status: AC | PRN

## 2011-04-03 MED ORDER — HYDROCODONE-ACETAMINOPHEN 5-325 MG PO TABS: 1.0000 | ORAL_TABLET | ORAL | Status: DC | PRN

## 2011-04-03 MED ORDER — CARVEDILOL 3.125 MG PO TABS: 6.2500 mg | ORAL_TABLET | Freq: Two times a day (BID) | ORAL | Status: DC

## 2011-04-03 MED ORDER — LISINOPRIL 10 MG PO TABS: 10.0000 mg | ORAL_TABLET | Freq: Every day | ORAL | Status: DC

## 2011-04-03 MED ORDER — LISINOPRIL 5 MG PO TABS
5.0000 mg | ORAL_TABLET | Freq: Every day | ORAL | Status: DC
  Administered 2011-04-03: 5 mg via ORAL
  Filled 2011-04-03: qty 1

## 2011-04-03 MED ORDER — AMOXICILLIN-POT CLAVULANATE 875-125 MG PO TABS: 1.0000 | ORAL_TABLET | Freq: Two times a day (BID) | ORAL | Status: AC

## 2011-04-03 NOTE — Progress Notes (Signed)
04/03/11 NSG 1430 Patient discharged to home with family, discharged instructions given and reviewed with patient.  Patient verbalized understanding, care notes given for new meds and pertinent education. Skin intact at discharge, IV discharged and intact. Patient escorted to car via wheelchair by volunteer service.  Alphonzo Lemmings, RN

## 2011-04-03 NOTE — ED Provider Notes (Signed)
I saw and evaluated the patient, reviewed the resident's note and I agree with the findings and plan.  Babette Relic, MD 04/03/11 [phone]

## 2011-04-03 NOTE — Discharge Summary (Signed)
Physician Discharge Summary  Patient ID: Gregory [name] MRN: [id number] DOB/AGE: 1932-07-14 78 y.o.  Admit date: 03/30/2011 Discharge date: 04/03/2011  Admission Diagnoses: Abdominal Pain, nausea, vomiting and Acute Appendicitis.  Discharge Diagnoses: Acute Appendicitis with Peritoneal Abscess.  Active Problems:  Appendicitis with abscess  CAD (coronary artery disease),CABG 1993-LIMA to the LAD, SVG to Om and SVG to PDA/PLA, patent on cath 2008.  Chronic a-fib  Cardiomyopathy, ischemic  Renal calculi  CVA (cerebral vascular accident)  GERD (gastroesophageal reflux disease)  History of tobacco use Post op renal Insuffiency, now resolved.  PROCEDURES: Laparoscopic Appendectomy, and Drain placement. 03/31/11 Dr. Erroll Luna.   Consults: Dr. Nani Skillern [facility] Course: Patient is a 78 year old gentleman who presented to the ER on 03/29/2010 with 2 days of abdominal pain nausea and vomiting. He was unable to eat and his family reported he had not taken his medicines were turned 3 days prior to admission. Workup in the ER shows CT which was positive for acute appendicitis; an elevated WBC of 17,900. Creatinine was also slightly elevated at 1.26, potassium of 5.4. He was seen by Dr. Erroll Luna who recommended laparoscopic appendectomy the possibility of open appendectomy. He had significant peritonitis at the time of exam. He is routinely on Plavix been off for at least 48 hour because of his nausea and vomiting. In the OR he was found to have acute appendicitis with peritoneal abscess. The appendix was inflamed with signs of necrosis and perforation with abscess formation. He underwent laparoscopic appendectomy and was transferred to the step down unit postoperatively. Telemetry showed atrial fibrillation was PACs aberrant conduction. Heart rate was in the 90-120 range postoperatively. He was started back on his beta blocker Coreg 3.25 mg twice a day, his Lasix and lisinopril.  Additional IV beta blocker was ordered when necessary to cardiology consult with [facility] was obtained. It was noted by the family he previously been on Coumadin and the patient discontinued this. Therefore Dr. Rollene Fare had him on aspirin and Plavix. On 03/31/2010 lead showed a rise in creatinine to 2.01. His Lasix and lisinopril were placed on hold. Unfortunately he received double lisinopril anyway. He was slowly mobilized, as his ileus improved his diet was advanced. He was transferred to the floor telemetry unit. He continues to make slow steady progress. His blood pressure has been somewhat elevated. Cardiology has increased his Coreg to 6.25 mg twice a day, decreased his lisinopril to 5mg  daily . They have also resumed his Lasix. His blood pressure remains stable we'll plan to discharge him later today. His incisions healing nicely. The drain is intact and continues to have serous sanguinous output. He'll be switched over from Invanz to Augmentin for 7 more days. He scheduled for followup with Dr. Rollene Fare, he is contact Dr. Josetta Huddle office for followup next week.  Home health has been arranged. They will help with drain care, along with monitoring of his blood pressure and heart rate on the new medication dose with instructions contact Dr. Rollene Fare for problems with his blood pressure or heart rate.BP while eating lunch is still up, but I', going to let him go home.  I have talked to family and suggested they get a BP machine so they can keep track at home also.  Condition on discharge: Improving  [facility] physician assistant for Dr. Greer Pickerel  Disposition:  Home, with a walker, and home health to assist.  Current Discharge Medication List    START taking these medications  Details  amoxicillin-clavulanate (AUGMENTIN) 875-125 MG per tablet Take 1 tablet by mouth 2 (two) times daily. Qty: 14 tablet, Refills: 0    HYDROcodone-acetaminophen (NORCO) 5-325 MG  per tablet Take 1-2 tablets by mouth every 4 (four) hours as needed. Qty: 40 tablet, Refills: 0      CONTINUE these medications which have CHANGED   Details  carvedilol (COREG) 3.125 MG tablet Take 2 tablets (6.25 mg total) by mouth 2 (two) times daily with a meal.    lisinopril (PRINIVIL,ZESTRIL) 10 MG tablet Take 1 tablet (10 mg total) by mouth daily.      CONTINUE these medications which have NOT CHANGED   Details  aspirin 81 MG tablet Take 81 mg by mouth daily.    clopidogrel (PLAVIX) 75 MG tablet Take 75 mg by mouth daily.    furosemide (LASIX) 40 MG tablet Take 40 mg by mouth daily.    potassium chloride SA (K-DUR,KLOR-CON) 20 MEQ tablet Take 20 mEq by mouth daily.    simvastatin (ZOCOR) 40 MG tablet Take 40 mg by mouth every evening.       Follow-up Information    Follow up with Rebecca Eaton, MD on 04/13/2011. (@ 2:15 pm)    Contact information:   [place]       Follow up with CORNETT,THOMAS A., MD. Schedule an appointment as soon as possible for a visit in 1 week. (Irrigate drain as instructed 3 times a day.  Call if you have fever, increased abdominal pain, redness around incisions, nausea or vomiting.  Call if you have a change in the color of the drainage, or if it gets cloudy.)    Contact information:   [facility], [place], [place] [phone]          Signed: Earnstine Regal 04/03/2011, 10:46 AM

## 2011-04-03 NOTE — Progress Notes (Signed)
[facility]  DAILY PROGRESS NOTE   Subjective:  No events overnight. He is subjectively improving. First dose lasix scheduled for this morning.  Objective:  Temp:  [97.7 F (36.5 C)-97.8 F (36.6 C)] 97.7 F (36.5 C) (01/18 0535) Pulse Rate:  [68-75] 75  (01/18 0845) Resp:  [16] 16  (01/18 0535) BP: (150-180)/(67-97) 180/84 mmHg (01/18 0845) SpO2:  [95 %-96 %] 96 % (01/18 0535) Weight change:   Intake/Output from previous day: 01/17 0701 - 01/18 0700 In: 2710.3 [P.O.:1260; I.V.:1150.3; IV Piggyback:150] Out: 1050 [Urine:1050]  Intake/Output from this shift:    Medications: Current Facility-Administered Medications  Medication Dose Route Frequency Provider Last Rate Last Dose  . aspirin chewable tablet 81 mg  81 mg Oral Daily Earnstine Regal, [place]   81 mg at 04/02/11 0957  . carvedilol (COREG) tablet 6.25 mg  6.25 mg Oral BID WC Leonie Man, MD   6.25 mg at 04/03/11 0845  . clopidogrel (PLAVIX) tablet 75 mg  75 mg Oral Q breakfast Earnstine Regal, [place]   75 mg at 04/03/11 0655  . enoxaparin (LOVENOX) injection 40 mg  40 mg Subcutaneous Daily Thomas A. Cornett, MD   40 mg at 04/02/11 0957  . ertapenem (INVANZ) 1 g in sodium chloride 0.9 % 50 mL IVPB  1 g Intravenous QHS Thomas A. Cornett, MD   1 g at 04/02/11 2142  . furosemide (LASIX) tablet 40 mg  40 mg Oral Daily Leonie Man, MD      . HYDROcodone-acetaminophen [facility]) 5-325 MG per tablet 1-2 tablet  1-2 tablet Oral Q4H PRN Federico Flake, PA      . levalbuterol Penne Lash) nebulizer solution 0.63 mg  0.63 mg Nebulization Q6H Federico Flake, PA   0.63 mg at 04/02/11 2116  . lisinopril (PRINIVIL,ZESTRIL) tablet 5 mg  5 mg Oral Daily Alex Gardener Bruning, PA      . metoprolol (LOPRESSOR) injection 5 mg  5 mg Intravenous Q6H PRN Earnstine Regal, PA   5 mg at 04/03/11 [id number]  . morphine 2 MG/ML injection 2-4 mg  2-4 mg Intravenous Q1H PRN Earnstine Regal, PA   2 mg at 04/02/11 2139  . ondansetron  (ZOFRAN) tablet 4 mg  4 mg Oral Q6H PRN Thomas A. Cornett, MD       Or  . ondansetron (ZOFRAN) injection 4 mg  4 mg Intravenous Q6H PRN Thomas A. Cornett, MD      . pantoprazole (PROTONIX) EC tablet 40 mg  40 mg Oral Q1200 Meera K Patel, PHARMD      . simvastatin (ZOCOR) tablet 40 mg  40 mg Oral QPM Thomas A. Cornett, MD   40 mg at 04/02/11 1726  . DISCONTD: 0.9 %  sodium chloride infusion   Intravenous Continuous Gayland Curry, MD 20 mL/hr at 04/02/11 2049 20 mL at 04/02/11 2049  . DISCONTD: carvedilol (COREG) tablet 3.125 mg  3.125 mg Oral BID WC Thomas A. Cornett, MD   3.125 mg at 04/02/11 0744  . DISCONTD: oxyCODONE-acetaminophen (PERCOCET) 5-325 MG per tablet 1-2 tablet  1-2 tablet Oral Q4H PRN Thomas A. Cornett, MD   1 tablet at 04/01/11 1343  . DISCONTD: pantoprazole (PROTONIX) injection 40 mg  40 mg Intravenous Q1200 Earnstine Regal, PA   40 mg at 04/02/11 1247    Physical Exam: General appearance: alert and no distress Neck: no adenopathy, no carotid bruit, no JVD, supple, symmetrical, trachea midline and thyroid not enlarged, symmetric, no tenderness/mass/nodules  Lungs: clear to auscultation bilaterally Heart: irregularly irregular, s1/s2 Abdomen: soft, mildly tender, healing abdominal wounds from surgery Extremities: extremities normal, atraumatic, no cyanosis or edema Neurologic: Grossly normal  Lab Results: Results for orders placed during the hospital encounter of 03/30/11 (from the past 48 hour(s))  CBC     Status: Abnormal   Collection Time   04/02/11  6:37 AM      Component Value Range Comment   WBC 11.8 (*) 4.0 - 10.5 (K/uL)    RBC 3.76 (*) 4.22 - 5.81 (MIL/uL)    Hemoglobin 11.4 (*) 13.0 - 17.0 (g/dL)    HCT 34.8 (*) 39.0 - 52.0 (%)    MCV 92.6  78.0 - 100.0 (fL)    MCH 30.3  26.0 - 34.0 (pg)    MCHC 32.8  30.0 - 36.0 (g/dL)    RDW 13.6  11.5 - 15.5 (%)    Platelets 180  150 - 400 (K/uL)   COMPREHENSIVE METABOLIC PANEL     Status: Abnormal   Collection Time    04/02/11  6:37 AM      Component Value Range Comment   Sodium 135  135 - 145 (mEq/L)    Potassium 4.8  3.5 - 5.1 (mEq/L)    Chloride 101  96 - 112 (mEq/L)    CO2 26  19 - 32 (mEq/L)    Glucose, Bld 91  70 - 99 (mg/dL)    BUN 28 (*) 6 - 23 (mg/dL)    Creatinine, Ser 1.24  0.50 - 1.35 (mg/dL) DELTA CHECK NOTED   Calcium 8.1 (*) 8.4 - 10.5 (mg/dL)    Total Protein 6.2  6.0 - 8.3 (g/dL)    Albumin 2.7 (*) 3.5 - 5.2 (g/dL)    AST 24  0 - 37 (U/L)    ALT 15  0 - 53 (U/L)    Alkaline Phosphatase 99  39 - 117 (U/L)    Total Bilirubin 0.5  0.3 - 1.2 (mg/dL)    GFR calc non Af Amer 55 (*) >90 (mL/min)    GFR calc Af Amer 63 (*) >90 (mL/min)   MAGNESIUM     Status: Normal   Collection Time   04/02/11  6:37 AM      Component Value Range Comment   Magnesium 1.9  1.5 - 2.5 (mg/dL)   CBC     Status: Abnormal   Collection Time   04/03/11  4:58 AM      Component Value Range Comment   WBC 7.3  4.0 - 10.5 (K/uL)    RBC 3.84 (*) 4.22 - 5.81 (MIL/uL)    Hemoglobin 11.8 (*) 13.0 - 17.0 (g/dL)    HCT 35.3 (*) 39.0 - 52.0 (%)    MCV 91.9  78.0 - 100.0 (fL)    MCH 30.7  26.0 - 34.0 (pg)    MCHC 33.4  30.0 - 36.0 (g/dL)    RDW 13.2  11.5 - 15.5 (%)    Platelets 187  150 - 400 (K/uL)   BASIC METABOLIC PANEL     Status: Abnormal   Collection Time   04/03/11  4:58 AM      Component Value Range Comment   Sodium 136  135 - 145 (mEq/L)    Potassium 4.6  3.5 - 5.1 (mEq/L)    Chloride 102  96 - 112 (mEq/L)    CO2 27  19 - 32 (mEq/L)    Glucose, Bld 102 (*) 70 - 99 (mg/dL)  BUN 19  6 - 23 (mg/dL)    Creatinine, Ser 0.92  0.50 - 1.35 (mg/dL)    Calcium 8.3 (*) 8.4 - 10.5 (mg/dL)    GFR calc non Af Amer 80 (*) >90 (mL/min)    GFR calc Af Amer >90  >90 (mL/min)   PRO B NATRIURETIC PEPTIDE     Status: Abnormal   Collection Time   04/03/11  4:58 AM      Component Value Range Comment   Pro B Natriuretic peptide (BNP) 2061.0 (*) 0 - 450 (pg/mL)     Imaging: No results found.  Assessment:  1. Active  Problems: 2.  Appendicitis with abscess 3.  CAD (coronary artery disease),CABG 1993-LIMA to the LAD, SVG to Om and SVG to PDA/PLA, patent on cath 2008. 4.  Chronic a-fib 5.  Cardiomyopathy, ischemic 6.  CVA (cerebral vascular accident) 7.  GERD (gastroesophageal reflux disease) 8.  Renal calculi 9.  History of tobacco use 10.   Plan:  1. Markedly improved. Getting stronger. Plan to restart lasix today. He appears back on home cardiac meds. A-fib is rate controlled on ASA/Plavix. 2. Will-sign off .Marland Kitchen Follow-up with Dr. Rollene Fare after discharge.  Time Spent Directly with Patient:  15 minutes  Length of Stay:  LOS: 4 days   Pixie Casino, MD Attending Cardiologist [facility] 04/03/2011, 8:56 AM

## 2011-04-03 NOTE — Progress Notes (Signed)
Spoke with patient and wife in room.  They remember that he had [facility] in the past for his stroke but were unsure of the agency.  Attempted to look at old records and they were locked in the [facility] system.  Wife stated that they would use [facility] for both needs, rolling walker and HHRN, since that sounded easiest. Face to face form sent to MD.  Address and phone number in [employer] system are correct.

## 2011-04-03 NOTE — Progress Notes (Signed)
4 Days Post-Op  Subjective: Pt ok. Sore, especially with activity. No N/V +BM and flatus Appreciate cardiology help  Objective: Vital signs in last 24 hours: Temp:  [97.7 F (36.5 C)-97.8 F (36.6 C)] 97.7 F (36.5 C) (01/18 0535) Pulse Rate:  [68-81] 68  (01/18 0630) Resp:  [16] 16  (01/18 0535) BP: (144-169)/(67-97) 169/94 mmHg (01/18 0630) SpO2:  [94 %-96 %] 96 % (01/18 0535) Last BM Date: 04/02/11  Intake/Output this shift:    Physical Exam: BP 169/94  Pulse 68  Temp(Src) 97.7 F (36.5 C) (Oral)  Resp 16  Ht 5\' 10"  (1.778 m)  Wt 90.719 kg (200 lb)  BMI 28.70 kg/m2  SpO2 96% Abdomen: soft, ND, incisions c/d/i Drain intact, serosanguinous output  Labs: CBC  Basename 04/03/11 0458 04/02/11 0637  WBC 7.3 11.8*  HGB 11.8* 11.4*  HCT 35.3* 34.8*  PLT 187 180   BMET  Basename 04/03/11 0458 04/02/11 0637  NA 136 135  K 4.6 4.8  CL 102 101  CO2 27 26  GLUCOSE 102* 91  BUN 19 28*  CREATININE 0.92 1.24  CALCIUM 8.3* 8.1*   LFT  Basename 04/02/11 0637  PROT 6.2  ALBUMIN 2.7*  AST 24  ALT 15  ALKPHOS 99  BILITOT 0.5  BILIDIR --  IBILI --  LIPASE --   PT/INR No results found for this basename: LABPROT:2,INR:2 in the last 72 hours ABG No results found for this basename: PHART:2,PCO2:2,PO2:2,HCO3:2 in the last 72 hours  Studies/Results: No results found.  Assessment: Active Problems:  Appendicitis with abscess  CAD (coronary artery disease),CABG 1993-LIMA to the LAD, SVG to Om and SVG to PDA/PLA, patent on cath 2008.  Chronic a-fib  Cardiomyopathy, ischemic  CVA (cerebral vascular accident)  GERD (gastroesophageal reflux disease)  Renal calculi  History of tobacco use   Procedure(s): APPENDECTOMY LAPAROSCOPIC  Plan: WBC normal, Cr down to 0.9 Restart Lisinopril at 5mg  Continue OOB/ambulation this am. Hopefully ready for DC later today, d/w family.  LOS: 4 days    Federico Flake 04/03/2011

## 2011-04-03 NOTE — Progress Notes (Signed)
04/03/11 NSG 1210 Handout given to pt. Wife on JP care and explained how to empty and re-charge drain.  She verbalize understanding and successfully re-demonstration the process.  Will continue to follow and assist with d/c planning.  Alphonzo Lemmings, RN

## 2011-04-04 NOTE — Progress Notes (Signed)
Gregory Sze M. Nicolas Sisler, MD, FACS General, Bariatric, & Minimally Invasive Surgery [facility]  

## 2011-04-04 NOTE — Discharge Summary (Signed)
Gregory Snoke M. Jarquez Mestre, MD, FACS General, Bariatric, & Minimally Invasive Surgery [facility]  

## 2011-04-06 ENCOUNTER — Telehealth (INDEPENDENT_AMBULATORY_CARE_PROVIDER_SITE_OTHER): Payer: Self-pay | Admitting: General Surgery

## 2011-04-09 ENCOUNTER — Encounter (INDEPENDENT_AMBULATORY_CARE_PROVIDER_SITE_OTHER): Payer: Self-pay | Admitting: Surgery

## 2011-04-09 ENCOUNTER — Ambulatory Visit (INDEPENDENT_AMBULATORY_CARE_PROVIDER_SITE_OTHER): Payer: Medicare Other | Admitting: Surgery

## 2011-04-09 VITALS — BP 124/66 | Temp 97.2°F | Ht 70.0 in | Wt 202.6 lb

## 2011-04-09 DIAGNOSIS — Z9889 Other specified postprocedural states: Secondary | ICD-10-CM

## 2011-04-09 NOTE — Progress Notes (Signed)
NAME: Gregory [name]                                            DOB: [birth year] DATE: 04/09/2011                                                  MRN: [id number]  CC: Post op   HPI: This patient comes in for post op follow-up.Heunderwent laparoscopic appendectomy on  03/30/2011. He feels that he is doing well.  PE: General: The patient appears to be healthy, NAD Wounds clean  JP removes DATA REVIEWED: Path appendicitis  IMPRESSION: The patient is doing well S/P lap appendectomy    PLAN: Return full duty PRN follow up

## 2011-04-09 NOTE — Patient Instructions (Signed)
Return to full activity. OK to shower

## 2011-04-21 NOTE — Telephone Encounter (Signed)
Erroneous encounter

## 2012-02-01 ENCOUNTER — Ambulatory Visit (INDEPENDENT_AMBULATORY_CARE_PROVIDER_SITE_OTHER): Payer: Medicare Other | Admitting: Family Medicine

## 2012-02-01 ENCOUNTER — Ambulatory Visit (HOSPITAL_COMMUNITY)
Admission: RE | Admit: 2012-02-01 | Discharge: 2012-02-01 | Disposition: A | Discharge status: Home / Self Care | Payer: Medicare Other | Source: Ambulatory Visit | Attending: Gastroenterology | Admitting: Gastroenterology

## 2012-02-01 ENCOUNTER — Encounter (HOSPITAL_COMMUNITY)
Admission: RE | Disposition: A | Discharge status: Home / Self Care | Payer: Self-pay | Source: Ambulatory Visit | Attending: Gastroenterology

## 2012-02-01 ENCOUNTER — Encounter (HOSPITAL_COMMUNITY): Payer: Self-pay | Admitting: *Deleted

## 2012-02-01 VITALS — BP 136/72 | HR 93 | Temp 98.2°F | Resp 18 | Ht 66.0 in | Wt 167.0 lb

## 2012-02-01 DIAGNOSIS — T18108A Unspecified foreign body in esophagus causing other injury, initial encounter: Secondary | ICD-10-CM | POA: Insufficient documentation

## 2012-02-01 DIAGNOSIS — Z951 Presence of aortocoronary bypass graft: Secondary | ICD-10-CM | POA: Insufficient documentation

## 2012-02-01 DIAGNOSIS — K222 Esophageal obstruction: Secondary | ICD-10-CM | POA: Insufficient documentation

## 2012-02-01 DIAGNOSIS — Z8673 Personal history of transient ischemic attack (TIA), and cerebral infarction without residual deficits: Secondary | ICD-10-CM | POA: Insufficient documentation

## 2012-02-01 DIAGNOSIS — I2510 Atherosclerotic heart disease of native coronary artery without angina pectoris: Secondary | ICD-10-CM | POA: Insufficient documentation

## 2012-02-01 DIAGNOSIS — K209 Esophagitis, unspecified without bleeding: Secondary | ICD-10-CM | POA: Insufficient documentation

## 2012-02-01 DIAGNOSIS — K2990 Gastroduodenitis, unspecified, without bleeding: Secondary | ICD-10-CM | POA: Insufficient documentation

## 2012-02-01 DIAGNOSIS — R1310 Dysphagia, unspecified: Secondary | ICD-10-CM

## 2012-02-01 DIAGNOSIS — IMO0002 Reserved for concepts with insufficient information to code with codable children: Secondary | ICD-10-CM | POA: Insufficient documentation

## 2012-02-01 DIAGNOSIS — K2970 Gastritis, unspecified, without bleeding: Secondary | ICD-10-CM | POA: Insufficient documentation

## 2012-02-01 DIAGNOSIS — K269 Duodenal ulcer, unspecified as acute or chronic, without hemorrhage or perforation: Secondary | ICD-10-CM | POA: Insufficient documentation

## 2012-02-01 HISTORY — PX: ESOPHAGOGASTRODUODENOSCOPY: SHX5428

## 2012-02-01 SURGERY — EGD (ESOPHAGOGASTRODUODENOSCOPY)
Anesthesia: Moderate Sedation

## 2012-02-01 MED ORDER — MIDAZOLAM HCL 10 MG/2ML IJ SOLN
INTRAMUSCULAR | Status: AC
  Filled 2012-02-01: qty 2

## 2012-02-01 MED ORDER — MIDAZOLAM HCL 10 MG/2ML IJ SOLN
INTRAMUSCULAR | Status: DC | PRN
  Administered 2012-02-01: 2 mg via INTRAVENOUS
  Administered 2012-02-01: 1 mg via INTRAVENOUS

## 2012-02-01 MED ORDER — SODIUM CHLORIDE 0.9 % IV SOLN
INTRAVENOUS | Status: DC
  Administered 2012-02-01: 500 mL via INTRAVENOUS

## 2012-02-01 MED ORDER — FENTANYL CITRATE 0.05 MG/ML IJ SOLN
INTRAMUSCULAR | Status: DC | PRN
  Administered 2012-02-01: 25 ug via INTRAVENOUS

## 2012-02-01 MED ORDER — OMEPRAZOLE 20 MG PO CPDR: 40.0000 mg | DELAYED_RELEASE_CAPSULE | Freq: Every day | ORAL | Status: DC

## 2012-02-01 MED ORDER — FENTANYL CITRATE 0.05 MG/ML IJ SOLN
INTRAMUSCULAR | Status: AC
  Filled 2012-02-01: qty 2

## 2012-02-01 NOTE — Progress Notes (Signed)
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  He's had this happen before, he is unable to swallow anything including water.  He's been "harking and spitting" since eating some ham yesterday.  He has a family history of dysphagia, and his symptoms have been lifelong but worsening lately.  O:  NAD, patient calm but spitting Neck:  Supple without mass Heart:  Regular, rate about 70 Chest:  Clear  A:  Dysphagia  Plan:  Refer to gi now.

## 2012-02-01 NOTE — Patient Instructions (Addendum)
Dysphagia Swallowing problems (dysphagia) occur when solids and liquids seem to stick in your throat on the way down to your stomach, or the food takes longer to get to the stomach. Other symptoms (problems) include regurgitating (burping) up food, noises coming from the throat, chest discomfort with swallowing, and a feeling of fullness in the throat when swallowing. When blockage in the throat is complete it may be associated with drooling. CAUSES There are many causes of swallowing difficulties and the following is generalized information regarding a number of reasons for this problem. Problems with swallowing may occur because of problems with the muscles. The food cannot be propelled in the usual manner into the stomach. There may be ulcers, scar tissue, or inflammation (soreness) in the esophagus (the food tube from the mouth to the stomach) which blocks food from passing normally into the stomach. Causes of inflammation include acid reflux from the stomach into the esophagus. Inflammation can also be caused by the herpes simplex virus, Candida (yeast), radiation (as with treatment of cancer), or inflammation from medicines not taken with adequate fluids to wash them down into the stomach. There may be nerve problems so signals cannot be sent adequately telling the muscles of the esophagus to contract and move the food along. Achalasia is a rare disorder of the esophagus in which muscular contractions of the esophagus are uncoordinated. Globus hystericus is a relatively common problem in young females in which there is a sense of an obstruction or difficulty in swallowing, but in which no abnormalities can be found. This problem usually improves over time with reassurance and testing to rule out other causes. DIAGNOSIS A number of tests will help your caregiver know what is the cause of your swallowing problems. These tests may include a barium swallow in which X-rays are taken while you are drinking a  liquid that outlines the lining of the esophagus on X-ray. If the stomach and small bowel are also studied in this manner it is called an upper gastrointestinal exam (UGI). Endoscopy may be done in which your caregiver examines your throat, esophagus, stomach, and small bowel with a small, flexible scope. Motility studies which measure the effectiveness and coordination of the muscular contractions of the esophagus may also be done. TREATMENT The treatment of swallowing problems are many, varying from medicines to surgical treatment. The treatment varies with the type of problem found. Your caregiver will discuss your results and treatment with you. If swallowing problems are severe the long-term problems which may occur include: malnutrition, pneumonia (from food going into the breathing tubes called trachea and bronchi), and an increase in tumors (lumps) of the esophagus. SEEK IMMEDIATE MEDICAL CARE IF:  Food or another object becomes lodged in your throat or esophagus and will not move. Document Released: 02/28/2000 Document Revised: 09/01/2011 Document Reviewed: 10/19/2007 [facility] Patient Information 2013 [place].

## 2012-02-01 NOTE — Op Note (Signed)
[facility] [place], [address]   OPERATIVE PROCEDURE REPORT  PATIENT: Gregory [name], Gregory [name]  MR#: [id number] BIRTHDATE: Jul 29, 1932  GENDER: Male ENDOSCOPIST: Carol Ada, MD ASSISTANT:   Cherylynn Ridges, technician and Corwin Levins, RN PROCEDURE DATE: 02/01/2012 PROCEDURE:   EGD w/ biopsy ASA CLASS:   Class III INDICATIONS:Food impaction. MEDICATIONS: Versed 3 mg IV and Fentanyl 75 mcg IV TOPICAL ANESTHETIC:  DESCRIPTION OF PROCEDURE:   After the risks benefits and alternatives of the procedure were thoroughly explained, informed consent was obtained.  The Pentax Gastroscope [id number]  endoscope was introduced through the mouth  and advanced to the second portion of the duodenum Without limitations.      The instrument was slowly withdrawn as the mucosa was fully examined.    FINDINGS: In the distal esophagus there was a food impaction.  The secretions were suctioned.  With gentle pressure the food bolus was able to be pushed into the gastric lumen.  Evaluation of the distal esophagus revealed an esophagitis and stricture.  No evidence of malignancy.  In the antrum a gastritis was identified and cold biopsies were obtained.  The duodunum exhibited multiple superfical erosions.   Retroflexed views revealed no abnormalities.     The scope was then withdrawn from the patient and the procedure terminated.  COMPLICATIONS: There were no complications.  IMPRESSION: 1) Food impaction secondary to distal esophageal stricture. 2) Esophagitis. 3) Gastritis. 4) Duodenal erosions..  RECOMMENDATIONS:1) Start omeprazole 40 mg QD and take it 12 hours apart from Plavix. 2) Repeat EGD in 3-4 weeks off of Plavix. 3) Chew food well in the meantime.   _______________________________ eSignedCarol Ada, MD 02/01/2012 6:13 PM    PATIENT NAME:  Gregory [name], Gregory [name] MR#: [id number]

## 2012-02-01 NOTE — H&P (Signed)
  Reason for Consult:Food Impaction Referring Physician: Robyn Haber, M.D.  [name] HPI: The patient has a history food impaction in the past.  He was evaluated by Dr. Olevia Perches in 2008 for a meat impaction and also in 2001.  He denies taking any PPIs at this time.  His symptoms started acutely when he ate Country Ham and eggs yesterday morning.  Since that time he has been unable to tolerate his oral secretions.  Past Medical History  Diagnosis Date  . Coronary artery disease   . Stroke   . Kidney stones   . CAD (coronary artery disease),CABG 1993-LIMA to the LAD, SVG to Om and SVG to PDA/PLA, patent on cath 2008. 03/31/2011  . CHF (congestive heart failure)   . Heart attack   . Cataract   . Heart murmur     Past Surgical History  Procedure Date  . Coronary artery bypass graft   . Laparoscopic appendectomy 03/30/2011    Procedure: APPENDECTOMY LAPAROSCOPIC;  Surgeon: Joyice Faster. Cornett, MD;  Location: [facility];  Service: General;  Laterality: N/A;  . Appendectomy   . Eye surgery   . Coronary angioplasty with stent placement   . Cataract extraction w/ intraocular lens implant     History reviewed. No pertinent family history.  Social History:  reports that he quit smoking about 25 years ago. He does not have any smokeless tobacco history on file. He reports that he does not drink alcohol or use illicit drugs.  Allergies:  Allergies  Allergen Reactions  . Percocet (Oxycodone-Acetaminophen) Itching and Nausea Only    Medications:  Scheduled:  Continuous:   . sodium chloride 500 mL (02/01/12 1731)    No results found for this or any previous visit (from the past 24 hour(s)).   No results found.  ROS:  As stated above in the HPI otherwise negative.  Blood pressure 157/88, pulse 81, resp. rate 17, SpO2 98.00%.    PE: Gen: NAD, Alert and Oriented HEENT:  [place]/AT, EOMI Neck: Supple, no LAD Lungs: CTA Bilaterally CV: RRR without M/G/R ABM: Soft, NTND, +BS Ext: No  C/C/E  Assessment/Plan: 1) Food Impaction/  Plan: 1) EGD now.  Biff Rutigliano D 02/01/2012, 5:29 PM

## 2012-02-02 ENCOUNTER — Encounter (HOSPITAL_COMMUNITY): Payer: Self-pay

## 2012-02-02 ENCOUNTER — Encounter (HOSPITAL_COMMUNITY): Payer: Self-pay | Admitting: Gastroenterology

## 2012-06-13 ENCOUNTER — Other Ambulatory Visit (HOSPITAL_COMMUNITY): Payer: Self-pay | Admitting: Cardiovascular Disease

## 2012-06-13 DIAGNOSIS — R0602 Shortness of breath: Secondary | ICD-10-CM

## 2012-06-13 DIAGNOSIS — I25810 Atherosclerosis of coronary artery bypass graft(s) without angina pectoris: Secondary | ICD-10-CM

## 2012-06-13 DIAGNOSIS — I4510 Unspecified right bundle-branch block: Secondary | ICD-10-CM

## 2012-06-13 DIAGNOSIS — R0989 Other specified symptoms and signs involving the circulatory and respiratory systems: Secondary | ICD-10-CM

## 2012-06-13 DIAGNOSIS — I509 Heart failure, unspecified: Secondary | ICD-10-CM

## 2012-06-14 HISTORY — PX: OTHER SURGICAL HISTORY: SHX169

## 2012-06-23 ENCOUNTER — Ambulatory Visit (HOSPITAL_COMMUNITY)
Admission: RE | Admit: 2012-06-23 | Discharge: 2012-06-23 | Disposition: A | Discharge status: Home / Self Care | Payer: Medicare Other | Source: Ambulatory Visit | Attending: Cardiovascular Disease | Admitting: Cardiovascular Disease

## 2012-06-23 DIAGNOSIS — I2510 Atherosclerotic heart disease of native coronary artery without angina pectoris: Secondary | ICD-10-CM | POA: Insufficient documentation

## 2012-06-23 DIAGNOSIS — I509 Heart failure, unspecified: Secondary | ICD-10-CM | POA: Insufficient documentation

## 2012-06-23 DIAGNOSIS — R0989 Other specified symptoms and signs involving the circulatory and respiratory systems: Secondary | ICD-10-CM | POA: Insufficient documentation

## 2012-06-23 DIAGNOSIS — E119 Type 2 diabetes mellitus without complications: Secondary | ICD-10-CM | POA: Insufficient documentation

## 2012-06-23 DIAGNOSIS — I4510 Unspecified right bundle-branch block: Secondary | ICD-10-CM

## 2012-06-23 DIAGNOSIS — I4891 Unspecified atrial fibrillation: Secondary | ICD-10-CM | POA: Insufficient documentation

## 2012-06-23 DIAGNOSIS — I25810 Atherosclerosis of coronary artery bypass graft(s) without angina pectoris: Secondary | ICD-10-CM

## 2012-06-23 DIAGNOSIS — R0602 Shortness of breath: Secondary | ICD-10-CM

## 2012-06-23 DIAGNOSIS — R0609 Other forms of dyspnea: Secondary | ICD-10-CM | POA: Insufficient documentation

## 2012-06-23 HISTORY — PX: TRANSTHORACIC ECHOCARDIOGRAM: SHX275

## 2012-06-23 NOTE — Progress Notes (Signed)
Carotid Duplex Imaging Complete [facility] S[facility]eper

## 2012-06-27 ENCOUNTER — Telehealth: Payer: Self-pay | Admitting: Vascular Surgery

## 2012-06-27 NOTE — Telephone Encounter (Signed)
Received referral for carotid stenosis from Dr. Rollene Fare 06/27/12.  I spoke with Mrs. Trochez which states that Mr. Gengler doesn't want to schedule an appointment for consult until he can speak with Dr. Rollene Fare to make sure it is necessary.  Mr. Overholt wants his carotid blockage to be treated with medication.

## 2012-06-29 ENCOUNTER — Other Ambulatory Visit: Payer: Self-pay | Admitting: *Deleted

## 2012-07-11 ENCOUNTER — Encounter: Payer: Self-pay | Admitting: Cardiovascular Disease

## 2012-07-20 ENCOUNTER — Encounter: Payer: Self-pay | Admitting: Vascular Surgery

## 2012-07-21 ENCOUNTER — Ambulatory Visit (INDEPENDENT_AMBULATORY_CARE_PROVIDER_SITE_OTHER): Payer: Medicare Other | Admitting: Vascular Surgery

## 2012-07-21 ENCOUNTER — Other Ambulatory Visit: Payer: Self-pay | Admitting: *Deleted

## 2012-07-21 ENCOUNTER — Encounter: Payer: Self-pay | Admitting: Vascular Surgery

## 2012-07-21 ENCOUNTER — Other Ambulatory Visit (INDEPENDENT_AMBULATORY_CARE_PROVIDER_SITE_OTHER): Payer: Medicare Other | Admitting: *Deleted

## 2012-07-21 DIAGNOSIS — I6529 Occlusion and stenosis of unspecified carotid artery: Secondary | ICD-10-CM

## 2012-07-21 NOTE — Progress Notes (Signed)
Referring physician: Terance Ice M.D.  History of Present Illness:  Patient is a 79 y.o. year old male who presents for evaluation of carotid stenosis.  Patient has had a known carotid stenosis that has been followed with serial surveillance duplex ultrasound by Dr. Rollene Fare. The patient denies symptoms of TIA, amaurosis, or stroke.  The patient is currently on Plavix and aspirin antiplatelet therapy.  He is also on this dual therapy for chronic atrial fibrillation. He apparently had problems with Coumadin in the past. The carotid stenosis was found to be greater than 80% on recent screening carotid duplex exam and Dr. Lowella Fairy office. The patient did have a stroke approximate 6 years ago with left-sided weakness. He fully recovered from this.  Other medical problems include coronary artery disease in the other medical problems mentioned above.  These are currently stable and followed by Dr. Rollene Fare.  He is very active overall and can climb 2 flights of stairs without becoming short of breath. He does several outdoor activities around his house including all of his yard maintenance work and gardening.  Past Medical History  Diagnosis Date  . Coronary artery disease   . Stroke   . Kidney stones   . CAD (coronary artery disease),CABG 1993-LIMA to the LAD, SVG to Om and SVG to PDA/PLA, patent on cath 2008. 03/31/2011  . CHF (congestive heart failure)   . Heart attack   . Cataract   . Heart murmur   . Atrial fibrillation     Past Surgical History  Procedure Laterality Date  . Coronary artery bypass graft    . Laparoscopic appendectomy  03/30/2011    Procedure: APPENDECTOMY LAPAROSCOPIC;  Surgeon: Joyice Faster. Cornett, MD;  Location: [facility];  Service: General;  Laterality: N/A;  . Appendectomy    . Eye surgery    . Coronary angioplasty with stent placement    . Cataract extraction w/ intraocular lens implant    . Esophagogastroduodenoscopy  02/01/2012    Procedure:  ESOPHAGOGASTRODUODENOSCOPY (EGD);  Surgeon: Beryle Beams, MD;  Location: [facility];  Service: Endoscopy;  Laterality: N/A;     Social History History  Substance Use Topics  . Smoking status: Former Smoker    Quit date: 04/08/1986  . Smokeless tobacco: Current User    Types: Chew  . Alcohol Use: No    Family History Family History  Problem Relation Age of Onset  . Heart disease Father   . Heart attack Brother   . Heart disease Daughter     Allergies  Allergies  Allergen Reactions  . Percocet (Oxycodone-Acetaminophen) Itching and Nausea Only     Current Outpatient Prescriptions  Medication Sig Dispense Refill  . aspirin 81 MG tablet Take 81 mg by mouth daily.      . carvedilol (COREG) 3.125 MG tablet Take 2 tablets (6.25 mg total) by mouth 2 (two) times daily with a meal.      . clopidogrel (PLAVIX) 75 MG tablet Take 75 mg by mouth daily.      . furosemide (LASIX) 40 MG tablet Take 40 mg by mouth daily.      Marland Kitchen lisinopril (PRINIVIL,ZESTRIL) 10 MG tablet Take 1 tablet (10 mg total) by mouth daily.      Marland Kitchen omeprazole (PRILOSEC) 20 MG capsule Take 2 capsules (40 mg total) by mouth daily.  60 capsule  12  . potassium chloride SA (K-DUR,KLOR-CON) 20 MEQ tablet Take 20 mEq by mouth daily.      . simvastatin (ZOCOR) 40 MG  tablet Take 40 mg by mouth every evening.       No current facility-administered medications for this visit.    ROS:   General:  No weight loss, Fever, chills  HEENT: No recent headaches, no nasal bleeding, no visual changes, no sore throat  Neurologic: No dizziness, blackouts, seizures. No recent symptoms of stroke or mini- stroke. No recent episodes of slurred speech, or temporary blindness.  Cardiac: No recent episodes of chest pain/pressure, no shortness of breath at rest.  No shortness of breath with exertion.  Denies history of atrial fibrillation or irregular heartbeat  Vascular: No history of rest pain in feet.  No history of claudication.  No  history of non-healing ulcer, No history of DVT   Pulmonary: No home oxygen, no productive cough, no hemoptysis,  No asthma or wheezing  Musculoskeletal:  [ ]  Arthritis, [ ]  Low back pain,  [ ]  Joint pain  Hematologic:No history of hypercoagulable state.  No history of easy bleeding.  No history of anemia  Gastrointestinal: No hematochezia or melena,  No gastroesophageal reflux, no trouble swallowing  Urinary: [ ]  chronic Kidney disease, [ ]  on HD - [ ]  MWF or [ ]  TTHS, [ ]  Burning with urination, [ ]  Frequent urination, [ ]  Difficulty urinating;   Skin: No rashes  Psychological: No history of anxiety,  No history of depression   Physical Examination  Filed Vitals:   07/21/12 1043 07/21/12 1046  BP: 104/53 102/48  Pulse: 115   Height: 5\' 6"  (1.676 m)   Weight: 179 lb 4.8 oz (81.33 kg)   SpO2: 100%     Body mass index is 28.95 kg/(m^2).  General:  Alert and oriented, no acute distress HEENT: Normal Neck: Bilateral carotid bruits left greater than right no JVD Pulmonary: Clear to auscultation bilaterally Cardiac: Regular Rate and Rhythm without murmur Gastrointestinal: Soft, non-tender, non-distended, no mass Skin: No rash Extremity Pulses:  2+ radial, brachial, femoral bilaterally 2+ left dorsalis pedis, absent right dorsalis pedis absent posterior tibial pulses bilaterally Musculoskeletal: No deformity or edema  Neurologic: Upper and lower extremity motor 5/5 and symmetric  DATA: Repeated the patient's carotid duplex exam today for operative planning purposes and to obtain anatomic detail of the level of stenosis. Patient was confirmed to have a greater than 80% left internal carotid artery stenosis this the normal anatomic location for a bifurcation with normal vessel beyond the level of stenosis. I reviewed and interpreted this study.   ASSESSMENT: High-grade asymptomatic left internal carotid artery stenosis with moderate right internal carotid artery stenosis also  asymptomatic. Had lengthy discussion with the patient today as well as his family. I recommended to him left carotid endarterectomy for stroke prophylaxis. I also discussed with him the benefits of carotid endarterectomy versus medical management of his carotid stenosis in the risk of stroke reduction of surgery compared to medical therapy. However, at this point the patient has opted for continued medical management and carotid surveillance. He will have a repeat carotid duplex exam in 6 months time. If he changes his mind we could consider carotid endarterectomy any time in the near future.   PLAN:  See above   Ruta Hinds, MD [facility] Office: [phone] Pager: [phone]

## 2012-07-27 ENCOUNTER — Encounter: Payer: Self-pay | Admitting: Cardiovascular Disease

## 2012-09-14 ENCOUNTER — Telehealth: Payer: Self-pay | Admitting: Cardiovascular Disease

## 2012-09-14 MED ORDER — POTASSIUM CHLORIDE CRYS ER 20 MEQ PO TBCR: 20.0000 meq | EXTENDED_RELEASE_TABLET | Freq: Every day | ORAL | Status: DC

## 2012-09-14 MED ORDER — SIMVASTATIN 40 MG PO TABS: 40.0000 mg | ORAL_TABLET | Freq: Every evening | ORAL | Status: DC

## 2012-09-14 NOTE — Telephone Encounter (Signed)
Returned call and spoke w/ pt's daughter, [name].  Stated pt needs refill on simvastatin and potassium.  Stated pt went to request refills about 2 weeks ago and the pharmacy said they didn't know anything about it today when she called.  Informed refills will be sent.  Also reviewed med list.  Confirmed Rxs to go to [facility] on [address].  Refill(s) sent to pharmacy.  Daughter called back after refills sent and stated pt changed pharmacies and Rxs to go to [facility] (W).  Refill(s) sent to [facility] and call to [facility] to cancel.

## 2012-09-14 NOTE — Telephone Encounter (Signed)
Does he have to be on Simvastatin? He have been without this for 2 wks-If he needs to take it-it needs to be called in!

## 2012-11-30 ENCOUNTER — Other Ambulatory Visit: Payer: Self-pay | Admitting: Cardiovascular Disease

## 2012-11-30 LAB — COMPREHENSIVE METABOLIC PANEL
Albumin: 4 g/dL (ref 3.5–5.2)
BUN: 26 mg/dL — ABNORMAL HIGH (ref 6–23)
CO2: 28 mEq/L (ref 19–32)
Calcium: 8.7 mg/dL (ref 8.4–10.5)
Chloride: 104 mEq/L (ref 96–112)
Glucose, Bld: 91 mg/dL (ref 70–99)
Potassium: 5.1 mEq/L (ref 3.5–5.3)
Sodium: 140 mEq/L (ref 135–145)
Total Protein: 6.9 g/dL (ref 6.0–8.3)

## 2012-11-30 LAB — CBC WITH DIFFERENTIAL/PLATELET
Lymphocytes Relative: 31 % (ref 12–46)
Lymphs Abs: 2 10*3/uL (ref 0.7–4.0)
Neutrophils Relative %: 53 % (ref 43–77)
Platelets: 212 10*3/uL (ref 150–400)
RBC: 4.36 MIL/uL (ref 4.22–5.81)
WBC: 6.5 10*3/uL (ref 4.0–10.5)

## 2012-11-30 LAB — LIPID PANEL
HDL: 52 mg/dL (ref >39)
Triglycerides: 72 mg/dL (ref <150)

## 2012-12-08 ENCOUNTER — Ambulatory Visit (HOSPITAL_COMMUNITY): Payer: Medicare Other

## 2012-12-09 ENCOUNTER — Encounter: Payer: Self-pay | Admitting: Cardiovascular Disease

## 2013-01-14 HISTORY — PX: SPLENECTOMY, TOTAL: SHX788

## 2013-01-19 ENCOUNTER — Other Ambulatory Visit (HOSPITAL_COMMUNITY): Payer: Medicare Other

## 2013-01-19 ENCOUNTER — Ambulatory Visit: Payer: Medicare Other | Admitting: Vascular Surgery

## 2013-01-20 ENCOUNTER — Inpatient Hospital Stay (HOSPITAL_COMMUNITY)
Admission: AD | Admit: 2013-01-20 | Discharge: 2013-01-25 | DRG: 815 | Disposition: A | Discharge status: Home / Self Care | Payer: Medicare Other | Source: Ambulatory Visit | Attending: Family Medicine | Admitting: Family Medicine

## 2013-01-20 ENCOUNTER — Ambulatory Visit (INDEPENDENT_AMBULATORY_CARE_PROVIDER_SITE_OTHER): Payer: Medicare Other | Admitting: Family Medicine

## 2013-01-20 ENCOUNTER — Ambulatory Visit (HOSPITAL_COMMUNITY)
Admission: RE | Admit: 2013-01-20 | Discharge: 2013-01-20 | Disposition: A | Discharge status: Home / Self Care | Payer: Medicare Other | Source: Ambulatory Visit | Attending: Family Medicine | Admitting: Family Medicine

## 2013-01-20 ENCOUNTER — Ambulatory Visit: Payer: Medicare Other

## 2013-01-20 VITALS — BP 120/80 | HR 89 | Temp 97.8°F | Resp 18 | Ht 66.0 in | Wt 178.0 lb

## 2013-01-20 DIAGNOSIS — Z1389 Encounter for screening for other disorder: Secondary | ICD-10-CM

## 2013-01-20 DIAGNOSIS — I2510 Atherosclerotic heart disease of native coronary artery without angina pectoris: Secondary | ICD-10-CM

## 2013-01-20 DIAGNOSIS — I255 Ischemic cardiomyopathy: Secondary | ICD-10-CM

## 2013-01-20 DIAGNOSIS — I428 Other cardiomyopathies: Secondary | ICD-10-CM | POA: Diagnosis present

## 2013-01-20 DIAGNOSIS — Z7902 Long term (current) use of antithrombotics/antiplatelets: Secondary | ICD-10-CM

## 2013-01-20 DIAGNOSIS — I658 Occlusion and stenosis of other precerebral arteries: Secondary | ICD-10-CM | POA: Diagnosis present

## 2013-01-20 DIAGNOSIS — R1032 Left lower quadrant pain: Secondary | ICD-10-CM

## 2013-01-20 DIAGNOSIS — Z87891 Personal history of nicotine dependence: Secondary | ICD-10-CM

## 2013-01-20 DIAGNOSIS — I4891 Unspecified atrial fibrillation: Secondary | ICD-10-CM | POA: Diagnosis present

## 2013-01-20 DIAGNOSIS — I10 Essential (primary) hypertension: Secondary | ICD-10-CM | POA: Diagnosis present

## 2013-01-20 DIAGNOSIS — I6529 Occlusion and stenosis of unspecified carotid artery: Secondary | ICD-10-CM | POA: Diagnosis present

## 2013-01-20 DIAGNOSIS — Z8673 Personal history of transient ischemic attack (TIA), and cerebral infarction without residual deficits: Secondary | ICD-10-CM

## 2013-01-20 DIAGNOSIS — I639 Cerebral infarction, unspecified: Secondary | ICD-10-CM | POA: Diagnosis present

## 2013-01-20 DIAGNOSIS — K219 Gastro-esophageal reflux disease without esophagitis: Secondary | ICD-10-CM | POA: Diagnosis present

## 2013-01-20 DIAGNOSIS — Z7982 Long term (current) use of aspirin: Secondary | ICD-10-CM

## 2013-01-20 DIAGNOSIS — I5189 Other ill-defined heart diseases: Secondary | ICD-10-CM | POA: Diagnosis present

## 2013-01-20 DIAGNOSIS — F17200 Nicotine dependence, unspecified, uncomplicated: Secondary | ICD-10-CM | POA: Diagnosis present

## 2013-01-20 DIAGNOSIS — E871 Hypo-osmolality and hyponatremia: Secondary | ICD-10-CM | POA: Diagnosis present

## 2013-01-20 DIAGNOSIS — I252 Old myocardial infarction: Secondary | ICD-10-CM

## 2013-01-20 DIAGNOSIS — D72829 Elevated white blood cell count, unspecified: Secondary | ICD-10-CM | POA: Diagnosis present

## 2013-01-20 DIAGNOSIS — I2589 Other forms of chronic ischemic heart disease: Secondary | ICD-10-CM | POA: Diagnosis present

## 2013-01-20 DIAGNOSIS — I739 Peripheral vascular disease, unspecified: Secondary | ICD-10-CM | POA: Diagnosis present

## 2013-01-20 DIAGNOSIS — D735 Infarction of spleen: Secondary | ICD-10-CM | POA: Diagnosis present

## 2013-01-20 DIAGNOSIS — Z79899 Other long term (current) drug therapy: Secondary | ICD-10-CM

## 2013-01-20 DIAGNOSIS — I509 Heart failure, unspecified: Secondary | ICD-10-CM | POA: Diagnosis present

## 2013-01-20 DIAGNOSIS — Z951 Presence of aortocoronary bypass graft: Secondary | ICD-10-CM

## 2013-01-20 DIAGNOSIS — I513 Intracardiac thrombosis, not elsewhere classified: Secondary | ICD-10-CM | POA: Diagnosis present

## 2013-01-20 DIAGNOSIS — D7389 Other diseases of spleen: Principal | ICD-10-CM | POA: Diagnosis present

## 2013-01-20 DIAGNOSIS — I482 Chronic atrial fibrillation, unspecified: Secondary | ICD-10-CM | POA: Diagnosis present

## 2013-01-20 DIAGNOSIS — I5022 Chronic systolic (congestive) heart failure: Secondary | ICD-10-CM | POA: Diagnosis present

## 2013-01-20 DIAGNOSIS — E876 Hypokalemia: Secondary | ICD-10-CM | POA: Diagnosis not present

## 2013-01-20 DIAGNOSIS — E7800 Pure hypercholesterolemia, unspecified: Secondary | ICD-10-CM | POA: Diagnosis present

## 2013-01-20 HISTORY — DX: Gastro-esophageal reflux disease without esophagitis: K21.9

## 2013-01-20 HISTORY — DX: Infarction of spleen: D73.5

## 2013-01-20 LAB — LIPASE: Lipase: 45 U/L (ref 0–75)

## 2013-01-20 LAB — POCT UA - MICROSCOPIC ONLY
Bacteria, U Microscopic: NEGATIVE
Casts, Ur, LPF, POC: NEGATIVE
WBC, Ur, HPF, POC: NEGATIVE
Yeast, UA: NEGATIVE

## 2013-01-20 LAB — CREATININE, SERUM: Creatinine, Ser: 1.12 mg/dL (ref 0.50–1.35)

## 2013-01-20 LAB — POCT URINALYSIS DIPSTICK
Bilirubin, UA: NEGATIVE
Blood, UA: NEGATIVE
Glucose, UA: NEGATIVE
Nitrite, UA: NEGATIVE
Spec Grav, UA: 1.025
Urobilinogen, UA: 0.2

## 2013-01-20 LAB — COMPREHENSIVE METABOLIC PANEL
Albumin: 4 g/dL (ref 3.5–5.2)
BUN: 33 mg/dL — ABNORMAL HIGH (ref 6–23)
CO2: 28 mEq/L (ref 19–32)
Calcium: 9.4 mg/dL (ref 8.4–10.5)
Chloride: 100 mEq/L (ref 96–112)
Glucose, Bld: 131 mg/dL — ABNORMAL HIGH (ref 70–99)
Potassium: 4.7 mEq/L (ref 3.5–5.3)

## 2013-01-20 LAB — POCT CBC
Granulocyte percent: 84.1 %G — AB (ref 37–80)
HCT, POC: 44.9 % (ref 43.5–53.7)
MCV: 98.4 fL — AB (ref 80–97)
POC LYMPH PERCENT: 12.4 %L (ref 10–50)
RBC: 4.56 M/uL — AB (ref 4.69–6.13)

## 2013-01-20 LAB — BUN: BUN: 32 mg/dL — ABNORMAL HIGH (ref 6–23)

## 2013-01-20 MED ORDER — FUROSEMIDE 40 MG PO TABS
40.0000 mg | ORAL_TABLET | Freq: Every day | ORAL | Status: DC
  Administered 2013-01-21: 40 mg via ORAL
  Filled 2013-01-20: qty 1

## 2013-01-20 MED ORDER — HYDROCODONE-ACETAMINOPHEN 7.5-325 MG/15ML PO SOLN
10.0000 mL | Freq: Once | ORAL | Status: AC
  Administered 2013-01-20: 10 mL via ORAL

## 2013-01-20 MED ORDER — HYDROCODONE-ACETAMINOPHEN 7.5-325 MG/15ML PO SOLN: 10.0000 mL | Freq: Four times a day (QID) | ORAL | Status: DC | PRN

## 2013-01-20 MED ORDER — LISINOPRIL 10 MG PO TABS
10.0000 mg | ORAL_TABLET | Freq: Every day | ORAL | Status: DC
  Administered 2013-01-21 – 2013-01-25 (×4): 10 mg via ORAL
  Filled 2013-01-20 (×5): qty 1

## 2013-01-20 MED ORDER — CLOPIDOGREL BISULFATE 75 MG PO TABS
75.0000 mg | ORAL_TABLET | Freq: Every day | ORAL | Status: DC
  Administered 2013-01-21 – 2013-01-22 (×2): 75 mg via ORAL
  Filled 2013-01-20 (×2): qty 1

## 2013-01-20 MED ORDER — ONDANSETRON HCL 4 MG PO TABS: 4.0000 mg | ORAL_TABLET | Freq: Four times a day (QID) | ORAL | Status: DC | PRN

## 2013-01-20 MED ORDER — ATORVASTATIN CALCIUM 40 MG PO TABS
40.0000 mg | ORAL_TABLET | Freq: Every day | ORAL | Status: DC
  Administered 2013-01-22 – 2013-01-25 (×4): 40 mg via ORAL
  Filled 2013-01-20 (×5): qty 1

## 2013-01-20 MED ORDER — IOHEXOL 300 MG/ML  SOLN
100.0000 mL | Freq: Once | INTRAMUSCULAR | Status: AC | PRN
  Administered 2013-01-20: 100 mL via INTRAVENOUS

## 2013-01-20 MED ORDER — ONDANSETRON HCL 4 MG/2ML IJ SOLN
4.0000 mg | Freq: Four times a day (QID) | INTRAMUSCULAR | Status: DC | PRN
  Filled 2013-01-20: qty 2

## 2013-01-20 MED ORDER — MORPHINE SULFATE 2 MG/ML IJ SOLN
2.0000 mg | INTRAMUSCULAR | Status: DC | PRN
  Administered 2013-01-20 – 2013-01-21 (×4): 2 mg via INTRAVENOUS
  Filled 2013-01-20 (×4): qty 1

## 2013-01-20 MED ORDER — HEPARIN SODIUM (PORCINE) 5000 UNIT/ML IJ SOLN
5000.0000 [IU] | Freq: Three times a day (TID) | INTRAMUSCULAR | Status: DC
  Administered 2013-01-21: 5000 [IU] via SUBCUTANEOUS
  Filled 2013-01-20 (×4): qty 1

## 2013-01-20 MED ORDER — CARVEDILOL 3.125 MG PO TABS
3.1250 mg | ORAL_TABLET | Freq: Two times a day (BID) | ORAL | Status: DC
  Administered 2013-01-20 – 2013-01-21 (×2): 3.125 mg via ORAL
  Filled 2013-01-20 (×4): qty 1

## 2013-01-20 MED ORDER — DEXTROSE-NACL 5-0.45 % IV SOLN
INTRAVENOUS | Status: DC
  Administered 2013-01-20: 23:00:00 via INTRAVENOUS

## 2013-01-20 NOTE — Patient Instructions (Addendum)
We will send you for a CT scan this evening.  Go to [address] [facility]. Use valet parking, it is free. Then go to radiology. You will be at the hospital for 2 hours drinking oral contrast and then you will get an IV of contrast. You will also have to have bloodwork to check your BUN and CREATININE.  I will call you with your results prior to your going home.    You can use the rx for the pain medication that you have been given as needed- remember it can cause sleepiness and constipation

## 2013-01-20 NOTE — H&P (Signed)
[facility] Admission History and Physical Service Pager: [phone]  Patient name: Gregory [name] Medical record number: [id number] Date of birth: October 29, 1932 Age: 79 y.o. Gender: male  Primary Care Provider: Kennon Portela, MD Consultants: General Surgery Code Status: Full Code  Chief Complaint: left upper quadrant abdominal pain  Assessment and Plan: Gregory [name] is a 79 y.o. 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 PMH is significant for chronic atrial fibrillation, ischemic cardiomyopathy, apendicits with abscess, CAD and CABG in 1993.   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 Likely cause of abdominal pain. No signs of acute abdomen. Discussed case with Dr. Rosendo Gros who doesn't recommend any surgical intervention at this time.  - morphine for pain control - gentle IV hydration given h/o systolic CHF with D5 [id number] - continue to monitor.  - in upcoming asplenic state, he will need Haemophilus, meningococcus and streptococcus immunizations.  # Chronic atrial fibrillation: rate controled on antiplatelet therapy: plavix and aspirin - continue coreg for rate control - monitor HR and BP's  # Systolic CHF and ischemic cardiomyopathy and h/o CAD: echo from 06/23/2012 showing EF: 40-45%  - continue coreg - continue home lasix 40mg  po - gentle hydration  # h/o CVA: - BP control - plavix and Apsirin  FEN/GI: D5 1/2NS at 50cc/hr, NPO Prophylaxis: heparin  Disposition: admit to med surg. Pending improvement.   History of Present Illness: Gregory [name] is a 79 y.o. male presenting with abdominal pain that started acutely this morning when he woke up. Pain is located in the left upper quadrant, it is a sharp, constant, non radiating pain. Nothing makes it better, nothing makes it worst. He has not been able to take anything by mouth due to nausea and vomiting  today. Had multiple episodes of non bilious, non bloody emesis. He denies any diarrhea, constipation, dysuria or polyuria. No fevers, no chills. No shortness of breath, no cough, no chest pain, no increased lower extremity swelling.  Patient was evaluated at [facility] where a CT scan was ordered to evaluate for diverticulosis. Report came back as 90% splenic infarct and patient was admitted for IV hydration and pain medicine.   Review Of Systems: Per HPI with the following additions: none Otherwise 12 point review of systems was performed and was unremarkable.  Patient Active Problem List   Diagnosis Date Noted  . Occlusion and stenosis of carotid artery without mention of cerebral infarction 07/21/2012  . Appendicitis with abscess 03/31/2011  . CAD (coronary artery disease),CABG 1993-LIMA to the LAD, SVG to Om and SVG to PDA/PLA, patent on cath 2008. 03/31/2011  . Chronic a-fib 03/31/2011  . Cardiomyopathy, ischemic 03/31/2011  . CVA (cerebral vascular accident) 03/31/2011  . GERD (gastroesophageal reflux disease) 03/31/2011  . Renal calculi 03/31/2011  . History of tobacco use 03/31/2011   Past Medical History: Past Medical History  Diagnosis Date  . Coronary artery disease   . Stroke   . Kidney stones   . CAD (coronary artery disease),CABG 1993-LIMA to the LAD, SVG to Om and SVG to PDA/PLA, patent on cath 2008. 03/31/2011  . CHF (congestive heart failure)   . Heart attack   . Cataract   . Heart murmur   . Atrial fibrillation    Past Surgical History: Past Surgical History  Procedure Laterality Date  . Coronary artery bypass graft    . Laparoscopic appendectomy  03/30/2011  Procedure: APPENDECTOMY LAPAROSCOPIC;  Surgeon: Joyice Faster. Cornett, MD;  Location: [place];  Service: General;  Laterality: N/A;  . Appendectomy    . Eye surgery    . Coronary angioplasty with stent placement    . Cataract extraction w/ intraocular lens implant    . Esophagogastroduodenoscopy   02/01/2012    Procedure: ESOPHAGOGASTRODUODENOSCOPY (EGD);  Surgeon: Beryle Beams, MD;  Location: [facility];  Service: Endoscopy;  Laterality: N/A;   Social History: History  Substance Use Topics  . Smoking status: Former Smoker    Quit date: 04/08/1986  . Smokeless tobacco: Current User    Types: Chew  . Alcohol Use: No   Additional social history: none Please also refer to relevant sections of EMR.  Family History: Family History  Problem Relation Age of Onset  . Heart disease Father   . Heart attack Brother   . Heart disease Daughter    Allergies and Medications: Allergies  Allergen Reactions  . Percocet [Oxycodone-Acetaminophen] Itching and Nausea Only   No current facility-administered medications on file prior to encounter.   Current Outpatient Prescriptions on File Prior to Encounter  Medication Sig Dispense Refill  . clopidogrel (PLAVIX) 75 MG tablet Take 75 mg by mouth daily.      . furosemide (LASIX) 40 MG tablet Take 40 mg by mouth daily.        Objective: BP 148/80  Pulse 96  Temp(Src) 98.2 F (36.8 C)  Resp 16  SpO2 94% Exam: General: alert and oriented x3, elderly gentleman uncomfortable from pain HEENT: dry mucous membranes, PERRLA, EOMI Cardiovascular: S1S2, irregularly irregular rhythm in the 90's-100's, no murmur appreciated Respiratory: CTA b/l on anterior exam Abdomen: soft, bowel sounds present, tender to palpation in the left upper quadrant, no rebound, no guarding Extremities: no edema Skin: no rash Neuro: CN2-12 grossly intact, no focal findinds.   Labs and Imaging: CBC BMET   Recent Labs Lab 01/20/13 1625  WBC 13.6*  HGB 14.3  HCT 44.9    Recent Labs Lab 01/20/13 1622 01/20/13 1839  NA 139  --   K 4.7  --   CL 100  --   CO2 28  --   BUN 33* 32*  CREATININE 1.14 1.12  GLUCOSE 131*  --   CALCIUM 9.4  --      CLINICAL DATA: Left-sided abdominal pain  EXAM:  CT ABDOMEN AND PELVIS WITH CONTRAST  TECHNIQUE:   Multidetector CT imaging of the abdomen and pelvis was performed  using the standard protocol following bolus administration of  intravenous contrast.  CONTRAST: 166mL OMNIPAQUE IOHEXOL 300 MG/ML SOLN  COMPARISON: 03/30/2011  FINDINGS:  The lung bases appear clear. No pleural or pericardial effusion.  No suspicious liver abnormality. The gallbladder is normal. No  biliary dilatation. The pancreas appears normal. There is  significantly diminished enhancement of greater than 90% of the  splenic parenchyma consistent with splenic infarct. There is  significant calcified atherosclerotic change involving the splenic  artery which may be occluded, image 29/series 2. The adrenal glands  both appear normal. There are bilateral renal cysts identified. The  urinary bladder appears normal. The prostate gland and seminal  vesicles are unremarkable.  Advanced calcified atherosclerotic disease affects the abdominal  aorta and its branches. No aneurysm. There is no upper abdominal  adenopathy. There is no pelvic or inguinal adenopathy identified.  Small hiatal hernia noted. The stomach and the small bowel loops  have a normal course and caliber without obstruction. Normal  appearance of the proximal colon. There is multiple distal colonic  diverticula identified.  Review of the visualized osseous structures is significant for mild  multilevel degenerative disc disease. No aggressive lytic or  sclerotic bone lesions identified.  IMPRESSION:  1. Examination is positive for splenic infarction. At least 90% of  the splenic parenchyma appears involved. There is significant  calcified atherosclerotic disease involving the splenic artery which  may be occluded.  2. Atherosclerotic disease.  3. Hiatal hernia    Kandis Nab, MD 01/20/2013, 11:04 PM PGY-3, [facility] pager: [phone], text pages welcome

## 2013-01-20 NOTE — Progress Notes (Signed)
[facility] [address], [place] [address] [phone]  Date:  01/20/2013   Name:  Gregory [name]   DOB:  01-Aug-1932   MRN:  [id number]  PCP:  Kennon Portela, MD    Chief Complaint: Abdominal Pain   History of Present Illness:  Gregory [name] is a 79 y.o. very pleasant male patient who presents with the following:  History of CAD/ CABG, a fib, cardiomyopathy.  He has chronic a fib and is on aspirin and plavix.  Could not tolerate coumadin.  Also has history of carotid blockage per family report.     He noted onset of severe left sided abdominal pain about 7am this morning.  It has remained all day, has not moved to a different location.    He has not been able to eat more than a bite of food all day.  He is not able to take fluids either.   He has never heard of diverticulitis so he does not think he has had this.  He did have appendicitis last year.  No other history of abdominal surgery.    He has not noted any fever.  He did vomit a couple of times today.  No diarrhea noted.    Noted allergy to percocet. Per his chart he has used vicodin in the past (03/2011 after his appendectomy) without difficulty.  He does have a history of itching with percocet, but cannot remember exactly when this occurred and if it was before or after he tried the hydrocodone.  However per his daugter this allergy occurred prior to his appendix operation, perhaps during one of his heart operations. Jontez confirms that he is able to tolerate tylenol ok and that he would like to have something for pain now. Will give him 10 mg of lortab elixer now.    Patient Active Problem List   Diagnosis Date Noted  . Occlusion and stenosis of carotid artery without mention of cerebral infarction 07/21/2012  . Appendicitis with abscess 03/31/2011  . CAD (coronary artery disease),CABG 1993-LIMA to the LAD, SVG to Om and SVG to PDA/PLA, patent on cath 2008. 03/31/2011  . Chronic a-fib  03/31/2011  . Cardiomyopathy, ischemic 03/31/2011  . CVA (cerebral vascular accident) 03/31/2011  . GERD (gastroesophageal reflux disease) 03/31/2011  . Renal calculi 03/31/2011  . History of tobacco use 03/31/2011    Past Medical History  Diagnosis Date  . Coronary artery disease   . Stroke   . Kidney stones   . CAD (coronary artery disease),CABG 1993-LIMA to the LAD, SVG to Om and SVG to PDA/PLA, patent on cath 2008. 03/31/2011  . CHF (congestive heart failure)   . Heart attack   . Cataract   . Heart murmur   . Atrial fibrillation     Past Surgical History  Procedure Laterality Date  . Coronary artery bypass graft    . Laparoscopic appendectomy  03/30/2011    Procedure: APPENDECTOMY LAPAROSCOPIC;  Surgeon: Joyice Faster. Cornett, MD;  Location: [place];  Service: General;  Laterality: N/A;  . Appendectomy    . Eye surgery    . Coronary angioplasty with stent placement    . Cataract extraction w/ intraocular lens implant    . Esophagogastroduodenoscopy  02/01/2012    Procedure: ESOPHAGOGASTRODUODENOSCOPY (EGD);  Surgeon: Beryle Beams, MD;  Location: [facility];  Service: Endoscopy;  Laterality: N/A;    History  Substance Use Topics  . Smoking status: Former Smoker  Quit date: 04/08/1986  . Smokeless tobacco: Current User    Types: Chew  . Alcohol Use: No    Family History  Problem Relation Age of Onset  . Heart disease Father   . Heart attack Brother   . Heart disease Daughter     Allergies  Allergen Reactions  . Percocet [Oxycodone-Acetaminophen] Itching and Nausea Only    Medication list has been reviewed and updated.  Current Outpatient Prescriptions on File Prior to Visit  Medication Sig Dispense Refill  . aspirin 81 MG tablet Take 81 mg by mouth daily.      . carvedilol (COREG) 3.125 MG tablet Take 2 tablets (6.25 mg total) by mouth 2 (two) times daily with a meal.      . clopidogrel (PLAVIX) 75 MG tablet Take 75 mg by mouth daily.      . furosemide  (LASIX) 40 MG tablet Take 40 mg by mouth daily.      Marland Kitchen lisinopril (PRINIVIL,ZESTRIL) 10 MG tablet Take 1 tablet (10 mg total) by mouth daily.      Marland Kitchen omeprazole (PRILOSEC) 20 MG capsule Take 2 capsules (40 mg total) by mouth daily.  60 capsule  12  . potassium chloride SA (K-DUR,KLOR-CON) 20 MEQ tablet Take 1 tablet (20 mEq total) by mouth daily.  90 tablet  3  . simvastatin (ZOCOR) 40 MG tablet Take 1 tablet (40 mg total) by mouth every evening.  90 tablet  3   No current facility-administered medications on file prior to visit.    Review of Systems:  As per HPI- otherwise negative.   Physical Examination: Filed Vitals:   01/20/13 1536  BP: 120/80  Pulse: 89  Temp: 97.8 F (36.6 C)  Resp: 18   Filed Vitals:   01/20/13 1536  Height: 5\' 6"  (1.676 m)  Weight: 178 lb (80.74 kg)   Body mass index is 28.74 kg/(m^2). Ideal Body Weight: Weight in (lb) to have BMI = 25: 154.6  GEN: WDWN, NAD, Non-toxic, A & O x 3, lying down in room.  Appears uncomfortable but not toxic.   HEENT: Atraumatic, Normocephalic. Neck supple. No masses, No LAD. Ears and Nose: No external deformity. CV:  No M/G/R. No JVD. No thrill. No extra heart sounds. A fib but rate controlled.   PULM: CTA B, no wheezes, crackles, rhonchi. No retractions. No resp. distress. No accessory muscle use. ABD: S, ND, +BS. No rebound. No HSM. Moderate enderness in the left abdomen with some guarding.  EXTR: No c/c/e NEURO Normal gait.  PSYCH: Normally interactive. Conversant. Not depressed or anxious appearing.  Calm demeanor.   [facility] reading (PRIMARY) by  Dr. Lorelei Pont. abd series: non- specific bowel gas pattern  EXAM: ACUTE ABDOMEN SERIES (ABDOMEN 2 VIEW & CHEST 1 VIEW)  COMPARISON: None.  FINDINGS: The heart is mildly enlarged. The mediastinal and hilar contours are within normal limits. There is mild tortuosity and calcification of the thoracic aorta. No acute pulmonary findings. Mild eventration of the right  hemidiaphragm is noted.  Two views of the abdomen demonstrate an unremarkable bowel gas pattern. There is scattered air in the small bowel and colon. No distention or air-fluid levels to suggest obstruction. Colonic interposition is noted. The soft tissue shadows of the abdomen are maintained. No worrisome calcifications. The bony structures are intact.  IMPRESSION: Cardiac enlargement but no acute pulmonary findings.  Nonspecific bowel gas pattern. No definite findings for obstruction or perforation.  Results for orders placed in visit on 01/20/13  COMPREHENSIVE  METABOLIC PANEL      Result Value Range   Sodium 139  135 - 145 mEq/L   Potassium 4.7  3.5 - 5.3 mEq/L   Chloride 100  96 - 112 mEq/L   CO2 28  19 - 32 mEq/L   Glucose, Bld 131 (*) 70 - 99 mg/dL   BUN 33 (*) 6 - 23 mg/dL   Creat 1.14  0.50 - 1.35 mg/dL   Total Bilirubin 0.7  0.3 - 1.2 mg/dL   Alkaline Phosphatase 77  39 - 117 U/L   AST 23  0 - 37 U/L   ALT 20  0 - 53 U/L   Total Protein 7.3  6.0 - 8.3 g/dL   Albumin 4.0  3.5 - 5.2 g/dL   Calcium 9.4  8.4 - 10.5 mg/dL  AMYLASE      Result Value Range   Amylase 103  0 - 105 U/L  LIPASE      Result Value Range   Lipase 45  0 - 75 U/L  POCT URINALYSIS DIPSTICK      Result Value Range   Color, UA yellow     Clarity, UA clear     Glucose, UA negative     Bilirubin, UA negative     Ketones, UA 15     Spec Grav, UA 1.025     Blood, UA negative     pH, UA 5.5     Protein, UA 30     Urobilinogen, UA 0.2     Nitrite, UA negative     Leukocytes, UA Negative    POCT UA - MICROSCOPIC ONLY      Result Value Range   WBC, Ur, HPF, POC negative     RBC, urine, microscopic 0-1     Bacteria, U Microscopic negative     Mucus, UA negative     Epithelial cells, urine per micros 0-1     Crystals, Ur, HPF, POC negative     Casts, Ur, LPF, POC negative     Yeast, UA negative    POCT CBC      Result Value Range   WBC 13.6 (*) 4.6 - 10.2 K/uL   Lymph, poc 1.7  0.6 - 3.4    POC LYMPH PERCENT 12.4  10 - 50 %L   MID (cbc) 0.5  0 - 0.9   POC MID % 3.5  0 - 12 %M   POC Granulocyte 11.4 (*) 2 - 6.9   Granulocyte percent 84.1 (*) 37 - 80 %G   RBC 4.56 (*) 4.69 - 6.13 M/uL   Hemoglobin 14.3  14.1 - 18.1 g/dL   HCT, POC 44.9  43.5 - 53.7 %   MCV 98.4 (*) 80 - 97 fL   MCH, POC 31.4 (*) 27 - 31.2 pg   MCHC 31.8  31.8 - 35.4 g/dL   RDW, POC 14.4     Platelet Count, POC 268  142 - 424 K/uL   MPV 9.0  0 - 99.8 fL    Assessment and Plan: Abdominal pain, left lower quadrant - Plan: POCT urinalysis dipstick, POCT UA - Microscopic Only, POCT CBC, Comprehensive metabolic panel, Amylase, Lipase, DG Abd Acute W/Chest, CT Abdomen Pelvis W Contrast, HYDROcodone-acetaminophen (HYCET) 7.5-325 mg/15 ml solution, HYDROcodone-acetaminophen (HYCET) 7.5-325 mg/15 ml solution 10 mL  Screening for nephropathy - Plan: BUN, Creatinine, serum  Sent for CT to evaluate abdominal pain- concern for diverticulitis.  Also check labs for pancreatitis.  Pt will travel  for CT with his wife and daughter [name].  Gave paper rx for more lortab elixer to use as needed.    Signed Lamar Blinks, MD  Received CT report from Dr. Clovis Riley:   CT ABDOMEN AND PELVIS WITH CONTRAST  TECHNIQUE: Multidetector CT imaging of the abdomen and pelvis was performed using the standard protocol following bolus administration of intravenous contrast.  CONTRAST: 12mL OMNIPAQUE IOHEXOL 300 MG/ML SOLN  COMPARISON: 03/30/2011  FINDINGS: The lung bases appear clear. No pleural or pericardial effusion.  No suspicious liver abnormality. The gallbladder is normal. No biliary dilatation. The pancreas appears normal. There is significantly diminished enhancement of greater than 90% of the splenic parenchyma consistent with splenic infarct. There is significant calcified atherosclerotic change involving the splenic artery which may be occluded, image 29/series 2. The adrenal glands both appear normal. There are  bilateral renal cysts identified. The urinary bladder appears normal. The prostate gland and seminal vesicles are unremarkable.  Advanced calcified atherosclerotic disease affects the abdominal aorta and its branches. No aneurysm. There is no upper abdominal adenopathy. There is no pelvic or inguinal adenopathy identified.  Small hiatal hernia noted. The stomach and the small bowel loops have a normal course and caliber without obstruction. Normal appearance of the proximal colon. There is multiple distal colonic diverticula identified.  Review of the visualized osseous structures is significant for mild multilevel degenerative disc disease. No aggressive lytic or sclerotic bone lesions identified.  IMPRESSION: 1. Examination is positive for splenic infarction. At least 90% of the splenic parenchyma appears involved. There is significant calcified atherosclerotic disease involving the splenic artery which may be occluded.  2. Atherosclerotic disease.  3. Hiatal hernia  Discussed over the phone with pt and his family.  I am concerned about his ability to hydrate and control his pain at home, and he may need surgical consultation.  He is amenable to spending the night in the hospital.  Will try to direct admit him to the hospital.  Discussed with family practice resident on call who kindly agreed to admit him to the hospital.

## 2013-01-21 ENCOUNTER — Encounter (HOSPITAL_COMMUNITY): Payer: Self-pay | Admitting: General Practice

## 2013-01-21 DIAGNOSIS — D7389 Other diseases of spleen: Principal | ICD-10-CM

## 2013-01-21 DIAGNOSIS — I2589 Other forms of chronic ischemic heart disease: Secondary | ICD-10-CM

## 2013-01-21 DIAGNOSIS — D735 Infarction of spleen: Secondary | ICD-10-CM | POA: Diagnosis present

## 2013-01-21 DIAGNOSIS — I359 Nonrheumatic aortic valve disorder, unspecified: Secondary | ICD-10-CM

## 2013-01-21 DIAGNOSIS — I2510 Atherosclerotic heart disease of native coronary artery without angina pectoris: Secondary | ICD-10-CM

## 2013-01-21 HISTORY — DX: Infarction of spleen: D73.5

## 2013-01-21 LAB — CBC
HCT: 39.4 % (ref 39.0–52.0)
Hemoglobin: 13.8 g/dL (ref 13.0–17.0)
MCHC: 35 g/dL (ref 30.0–36.0)
MCV: 90.8 fL (ref 78.0–100.0)
RBC: 4.34 MIL/uL (ref 4.22–5.81)
RDW: 13.5 % (ref 11.5–15.5)

## 2013-01-21 LAB — PROTIME-INR
INR: 1.08 (ref 0.00–1.49)
Prothrombin Time: 13.8 seconds (ref 11.6–15.2)

## 2013-01-21 LAB — BASIC METABOLIC PANEL
BUN: 30 mg/dL — ABNORMAL HIGH (ref 6–23)
Calcium: 8.6 mg/dL (ref 8.4–10.5)
Chloride: 99 mEq/L (ref 96–112)
GFR calc Af Amer: >90 mL/min (ref >90)
GFR calc non Af Amer: 78 mL/min — ABNORMAL LOW (ref >90)
Glucose, Bld: 145 mg/dL — ABNORMAL HIGH (ref 70–99)
Potassium: 4.3 mEq/L (ref 3.5–5.1)
Sodium: 133 mEq/L — ABNORMAL LOW (ref 135–145)

## 2013-01-21 LAB — HEPARIN LEVEL (UNFRACTIONATED): Heparin Unfractionated: 0.67 IU/mL (ref 0.30–0.70)

## 2013-01-21 MED ORDER — HEPARIN (PORCINE) IN NACL 100-0.45 UNIT/ML-% IJ SOLN
1500.0000 [IU]/h | INTRAMUSCULAR | Status: DC
  Administered 2013-01-21 – 2013-01-23 (×3): 1300 [IU]/h via INTRAVENOUS
  Filled 2013-01-21 (×6): qty 250

## 2013-01-21 MED ORDER — HEPARIN BOLUS VIA INFUSION
4000.0000 [IU] | Freq: Once | INTRAVENOUS | Status: AC
  Administered 2013-01-21: 4000 [IU] via INTRAVENOUS
  Filled 2013-01-21: qty 4000

## 2013-01-21 MED ORDER — ACETAMINOPHEN 325 MG PO TABS
650.0000 mg | ORAL_TABLET | Freq: Four times a day (QID) | ORAL | Status: DC | PRN
  Administered 2013-01-21 – 2013-01-22 (×2): 650 mg via ORAL
  Filled 2013-01-21 (×2): qty 2

## 2013-01-21 MED ORDER — CARVEDILOL 6.25 MG PO TABS
6.2500 mg | ORAL_TABLET | Freq: Two times a day (BID) | ORAL | Status: DC
  Administered 2013-01-22: 6.25 mg via ORAL
  Filled 2013-01-21 (×3): qty 1

## 2013-01-21 NOTE — Progress Notes (Addendum)
ANTICOAGULATION CONSULT NOTE - Initial Consult  Pharmacy Consult for lovenox/warfarin>> now for heparin Indication: splenic infarct  Allergies  Allergen Reactions  . Percocet [Oxycodone-Acetaminophen] Itching and Nausea Only    Patient Measurements: Height: 5\' 6"  (167.6 cm) Weight: 178 lb (80.74 kg) IBW/kg (Calculated) : 63.8   Vital Signs: Temp: 97.6 F (36.4 C) (11/08 0452) Temp src: Oral (11/07 2236) BP: 125/55 mmHg (11/08 0812) Pulse Rate: 70 (11/08 0812)  Labs:  Recent Labs  01/20/13 1622 01/20/13 1625 01/20/13 1839 01/21/13 0420  HGB  --  14.3  --  13.8  HCT  --  44.9  --  39.4  PLT  --   --   --  237  CREATININE 1.14  --  1.12 0.95    Estimated Creatinine Clearance: 64 ml/min (by C-G formula based on Cr of 0.95).   Medical History: Past Medical History  Diagnosis Date  . Coronary artery disease   . CAD (coronary artery disease),CABG 1993-LIMA to the LAD, SVG to Om and SVG to PDA/PLA, patent on cath 2008. 03/31/2011  . CHF (congestive heart failure)   . Heart murmur   . Atrial fibrillation   . Hypertension   . High cholesterol   . Heart attack 1980's  . Pneumonia     "in the last 5 yrs" (01/22/2013)  . GERD (gastroesophageal reflux disease)   . Stroke     "slightly drags left foot since; recovered qthing else" (01/21/2013)  . Kidney stones     "passed all but one time when he had to have lithotripsy" (01/21/2013)    Medications:  Prescriptions prior to admission  Medication Sig Dispense Refill  . aspirin EC 81 MG tablet Take 81 mg by mouth daily.      . carvedilol (COREG) 3.125 MG tablet Take 3.125 mg by mouth 2 (two) times daily with a meal.      . clopidogrel (PLAVIX) 75 MG tablet Take 75 mg by mouth daily.      . furosemide (LASIX) 40 MG tablet Take 40 mg by mouth daily.      Marland Kitchen HYDROcodone-acetaminophen (HYCET) 7.5-325 mg/15 ml solution Take 10 mLs by mouth 4 (four) times daily as needed for moderate pain.      Marland Kitchen lisinopril (PRINIVIL,ZESTRIL)  10 MG tablet Take 10 mg by mouth daily.      . Multiple Vitamins-Minerals (MULTIVITAMIN PO) Take 1 tablet by mouth daily.      . pantoprazole (PROTONIX) 40 MG tablet Take 40 mg by mouth daily.      . potassium chloride SA (K-DUR,KLOR-CON) 20 MEQ tablet Take 20 mEq by mouth daily.      . simvastatin (ZOCOR) 80 MG tablet Take 80 mg by mouth at bedtime.        Assessment: 79 yo male admitted with left upper quadrant abdominal pain found to have splenic infarct.  Hgb/hct stable. Platelets within normal limits.  Scr trending down.  Patient not on anticoagulant PTA, thus would not expect APTT or INR to be elevated.   Goal of Therapy:  Heparin Level: 0.3-0.7 Monitor platelets by anticoagulation protocol: Yes  Plan:  1. Give heparin bolus 4000 units via infusion 2. Start heparin drip 1300 units/hour after bolus 3. Follow-up 8 hour heparin level 4. Monitor CBC and heparin level daily  Thank you, Vivia Ewing, PharmD Clinical Pharmacist - Resident Pager: [phone] Pharmacy: [phone] 01/21/2013 8:57 AM

## 2013-01-21 NOTE — Progress Notes (Signed)
ANTICOAGULATION CONSULT NOTE - [place] for heparin Indication: splenic infarct  Allergies  Allergen Reactions  . Percocet [Oxycodone-Acetaminophen] Itching and Nausea Only    Patient Measurements: Height: 5\' 6"  (167.6 cm) Weight: 178 lb (80.74 kg) IBW/kg (Calculated) : 63.8   Vital Signs: Temp: 98.7 F (37.1 C) (11/08 1515) BP: 118/64 mmHg (11/08 1515) Pulse Rate: 86 (11/08 1515)  Labs:  Recent Labs  01/20/13 1622 01/20/13 1625 01/20/13 1839 01/21/13 0420 01/21/13 1130 01/21/13 2024  HGB  --  14.3  --  13.8  --   --   HCT  --  44.9  --  39.4  --   --   PLT  --   --   --  237  --   --   APTT  --   --   --   --  37  --   LABPROT  --   --   --   --  13.8  --   INR  --   --   --   --  1.08  --   HEPARINUNFRC  --   --   --   --   --  0.67  CREATININE 1.14  --  1.12 0.95  --   --     Estimated Creatinine Clearance: 64 ml/min (by C-G formula based on Cr of 0.95).   Medical History: Past Medical History  Diagnosis Date  . Coronary artery disease   . CAD (coronary artery disease),CABG 1993-LIMA to the LAD, SVG to Om and SVG to PDA/PLA, patent on cath 2008. 2003  . CHF (congestive heart failure)   . Heart murmur   . Atrial fibrillation     permanent  . Hypertension   . High cholesterol   . Heart attack 1980's  . Pneumonia     "in the last 5 yrs" (01/22/2013)  . GERD (gastroesophageal reflux disease)   . Stroke     "slightly drags left foot since; recovered qthing else" (01/21/2013)  . Kidney stones     "passed all but one time when he had to have lithotripsy" (01/21/2013)  . Cardiomyopathy 2008    EF 20% but now improved to 40-45%  . 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 aspirin EC 81 MG tablet Take 81 mg by mouth daily.      . carvedilol (COREG) 3.125 MG tablet Take 3.125 mg by mouth 2 (two) times daily with a meal.      . clopidogrel (PLAVIX) 75 MG tablet  Take 75 mg by mouth daily.      . furosemide (LASIX) 40 MG tablet Take 40 mg by mouth daily.      Marland Kitchen HYDROcodone-acetaminophen (HYCET) 7.5-325 mg/15 ml solution Take 10 mLs by mouth 4 (four) times daily as needed for moderate pain.      Marland Kitchen lisinopril (PRINIVIL,ZESTRIL) 10 MG tablet Take 10 mg by mouth daily.      . Multiple Vitamins-Minerals (MULTIVITAMIN PO) Take 1 tablet by mouth daily.      . pantoprazole (PROTONIX) 40 MG tablet Take 40 mg by mouth daily.      . potassium chloride SA (K-DUR,KLOR-CON) 20 MEQ tablet Take 20 mEq by mouth daily.      . simvastatin (ZOCOR) 80 MG tablet Take 80 mg by mouth at bedtime.        Assessment: 79 yo male admitted with left upper quadrant abdominal  pain found to have splenic infarct.  Hgb/hct stable. Platelets within normal limits.  Scr trending down.  Patient not on anticoagulant PTA, thus would not expect APTT or INR to be elevated.   Initial heparin level therapeutic on 1300 units/hr.  No bleeding or complications noted per chart notes.  Goal of Therapy:  Heparin Level: 0.3-0.7 Monitor platelets by anticoagulation protocol: Yes  Plan:  1. Continue IV heparin at current rate. 2. F/u AM heparin level and CBC.  Uvaldo Rising, BCPS  Clinical Pharmacist Pager [phone]  01/21/2013 9:43 PM

## 2013-01-21 NOTE — Progress Notes (Signed)
Family Medicine Teaching Service Daily Progress Note Intern Pager: [phone]  Patient name: Gregory [name] Medical record number: [id number] Date of birth: Dec 09, 1932 Age: 79 y.o. Gender: male  Primary Care Provider: Kennon Portela, MD Consultants: none Code Status: Full  Pt Overview and Major Events to Date:  01/20/13: CT abdomen showing 90% of splenic infarct 01/21/13: heparin started for anticoagulation   Assessment and Plan:  Gregory [name] is a 79 y.o. 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 PMH is significant for chronic atrial fibrillation, ischemic cardiomyopathy, apendicits with abscess, CAD and CABG in 1993.   # Splenic infarct: question of possible embolus from chronic a-fib that may have infarcted spleen.  Patient was on plavix and aspirin for a-fib until now.  - plan on starting heparin for anticoagulation bridge.  - obtain 2D echo for evaluation of any thrombus - will consult [facility] cardiology for possible TEE for further assessment as well as recommendations for anticoagulation. (patient apparently has not tolerated warfarin in the last).  - in upcoming asplenic state, he will need Haemophilus, meningococcus and streptococcus immunizations.   # Chronic atrial fibrillation: currently on plavix and aspirin. Switch to heparin - continue coreg for rate control  - monitor HR and BP's   # Systolic CHF and ischemic cardiomyopathy and h/o CAD: echo from 06/23/2012 showing EF: 40-45%  - continue coreg  - hold lasix for now and monitor UOP and weights  - gentle hydration   # h/o CVA:  - BP control  - plavix and Apsirin  FEN/GI: D5 1/2NS at 50cc/hr, NPO  Prophylaxis: heparin    Disposition: admit to med surg. Pending improvement.    Subjective: pain improved with morphine although still uncomfortable.   Objective: Temp:  [97.6 F (36.4 C)-98.2 F (36.8 C)] 97.6 F (36.4 C) (11/08 0452) Pulse  Rate:  [67-96] 70 (11/08 0812) Resp:  [16-18] 16 (11/08 0452) BP: (120-148)/(55-80) 125/55 mmHg (11/08 1011) SpO2:  [91 %-98 %] 91 % (11/08 0452) Weight:  [178 lb (80.74 kg)] 178 lb (80.74 kg) (11/07 2240) Physical Exam: General: tired, in no acute distress Cardiovascular: S1S2, RRR, no murmur Respiratory: CTA b/l Abdomen: mild tenderness in left upper quadrant, no rebound, no guarding.  Extremities: no edema  Laboratory:  Recent Labs Lab 01/20/13 1625 01/21/13 0420  WBC 13.6* 14.6*  HGB 14.3 13.8  HCT 44.9 39.4  PLT  --  237    Recent Labs Lab 01/20/13 1622 01/20/13 1839 01/21/13 0420  NA 139  --  133*  K 4.7  --  4.3  CL 100  --  99  CO2 28  --  23  BUN 33* 32* 30*  CREATININE 1.14 1.12 0.95  CALCIUM 9.4  --  8.6  PROT 7.3  --   --   BILITOT 0.7  --   --   ALKPHOS 77  --   --   ALT 20  --   --   AST 23  --   --   GLUCOSE 131*  --  145*      Kandis Nab, MD 01/21/2013, 11:04 AM PGY-3, [place] Intern pager: [phone] Amion password: [email or web address], text pages welcome

## 2013-01-21 NOTE — Progress Notes (Signed)
Echocardiogram 2D Echocardiogram has been performed.  Gregory [name] 01/21/2013, 2:35 PM

## 2013-01-21 NOTE — Consult Note (Signed)
Reason for Consult:  Atrial fib with splenic infarct  Referring Physician: Dr. Otis Dials Dr. Lenore Manner PCP: Kennon Portela, MD Primary Cardiologist:Dr. Vanna Scotland is an 79 y.o. male.    Chief Complaint: admitted yesterday with abd. pain   HPI: 54yoM with history of AFib and CAD s/p CABG (1993), s/p CVA, who presented to his primary care physician after sudden onset LUQ abdominal pain that was excruciating, also with N/V at that time. Sent to [facility] for CT abd/pelvis which identified splenic infarct. Patient is interviewed with daughter Chong Sicilian) and wife at bedside. Patient, daughter and wife report that patient had been on warfarin in years past for secondary stroke prevention but he did not tolerate it ("dizzy"). Patient has been maintained on Plavix and aspirin.   CABG in 2003 and last cath 2008 with patent LIMA-LAD, patent VG-LCX, patent VG-PDA/PLA, but EF 20-25% He has declined ICD, though last echo EF 40-45%.  Known pul HTN.  Also relative bradycardia. + bil carotid disease. Mod severe rt carotid dz. And mod severe Lt carotid disease. Asymptomatic. Referred to Dr. Oneida Alar who recommended Lt CEA, pt preferred medical therapy.     Echo 06/2012: Left ventricle: The cavity size was mildly dilated. Wall thickness was increased in a pattern of mild LVH. Systolic function was mildly to moderately reduced. The estimated ejection fraction was in the range of 40% to 45%. Diffuse hypokinesis. Regional wall motion abnormalities cannot be excluded. The study is not technically sufficient to allow evaluation of LV diastolic function. - Ventricular septum: Thickness was increased. - Aortic valve: Mild regurgitation. - Mitral valve: Mild regurgitation. Valve area by pressure half-time: 1.98cm^2. - Left atrium: The atrium was moderately to severely dilated. - Right ventricle: The cavity size was mildly dilated. Systolic pressure was borderline increased. - Right  atrium: The atrium was moderately dilated. - Atrial septum: No defect or patent foramen ovale was identified.  Pt without chest pain or SOB + abd pain.  Not on tele currently will order.   Past Medical History  Diagnosis Date  . Coronary artery disease   . CAD (coronary artery disease),CABG 1993-LIMA to the LAD, SVG to Om and SVG to PDA/PLA, patent on cath 2008. 2003  . CHF (congestive heart failure)   . Heart murmur   . Atrial fibrillation     permanent  . Hypertension   . High cholesterol   . Heart attack 1980's  . Pneumonia     "in the last 5 yrs" (01/22/2013)  . GERD (gastroesophageal reflux disease)   . Stroke     "slightly drags left foot since; recovered qthing else" (01/21/2013)  . Kidney stones     "passed all but one time when he had to have lithotripsy" (01/21/2013)  . Cardiomyopathy 2008    EF 20% but now improved to 40-45%    Past Surgical History  Procedure Laterality Date  . Laparoscopic appendectomy  03/30/2011    Procedure: APPENDECTOMY LAPAROSCOPIC;  Surgeon: Joyice Faster. Cornett, MD;  Location: [place];  Service: General;  Laterality: N/A;  . Appendectomy    . Eye surgery    . Cataract extraction w/ intraocular lens implant Bilateral 2013  . Esophagogastroduodenoscopy  02/01/2012    Procedure: ESOPHAGOGASTRODUODENOSCOPY (EGD);  Surgeon: Beryle Beams, MD;  Location: [facility];  Service: Endoscopy;  Laterality: N/A;  . Coronary artery bypass graft  02/2002    "CABG X4" LIMA-LAD;VG-LCX; VG-PDA/PLA  . Coronary angioplasty  with stent placement  2003; 2008    "1 + 1; total of 2"   . Lithotripsy      "once" (01/21/2013)    Family History  Problem Relation Age of Onset  . Heart disease Father   . Heart attack Brother   . Heart disease Daughter    Social History:  reports that he quit smoking about 26 years ago. His smoking use included Cigarettes. He has a 40 pack-year smoking history. His smokeless tobacco use includes Chew. He reports that he does not drink  alcohol or use illicit drugs.  Allergies:  Allergies  Allergen Reactions  . Percocet [Oxycodone-Acetaminophen] Itching and Nausea Only    Medications Prior to Admission  Medication Sig Dispense Refill  . aspirin EC 81 MG tablet Take 81 mg by mouth daily.      . carvedilol (COREG) 3.125 MG tablet Take 3.125 mg by mouth 2 (two) times daily with a meal.      . clopidogrel (PLAVIX) 75 MG tablet Take 75 mg by mouth daily.      . furosemide (LASIX) 40 MG tablet Take 40 mg by mouth daily.      Marland Kitchen HYDROcodone-acetaminophen (HYCET) 7.5-325 mg/15 ml solution Take 10 mLs by mouth 4 (four) times daily as needed for moderate pain.      Marland Kitchen lisinopril (PRINIVIL,ZESTRIL) 10 MG tablet Take 10 mg by mouth daily.      . Multiple Vitamins-Minerals (MULTIVITAMIN PO) Take 1 tablet by mouth daily.      . pantoprazole (PROTONIX) 40 MG tablet Take 40 mg by mouth daily.      . potassium chloride SA (K-DUR,KLOR-CON) 20 MEQ tablet Take 20 mEq by mouth daily.      . simvastatin (ZOCOR) 80 MG tablet Take 80 mg by mouth at bedtime.        Results for orders placed during the hospital encounter of 01/20/13 (from the past 48 hour(s))  BASIC METABOLIC PANEL     Status: Abnormal   Collection Time    01/21/13  4:20 AM      Result Value Range   Sodium 133 (*) 135 - 145 mEq/L   Potassium 4.3  3.5 - 5.1 mEq/L   Chloride 99  96 - 112 mEq/L   CO2 23  19 - 32 mEq/L   Glucose, Bld 145 (*) 70 - 99 mg/dL   BUN 30 (*) 6 - 23 mg/dL   Creatinine, Ser 0.95  0.50 - 1.35 mg/dL   Calcium 8.6  8.4 - 10.5 mg/dL   GFR calc non Af Amer 78 (*) >90 mL/min   GFR calc Af Amer >90  >90 mL/min   Comment: (NOTE)     The eGFR has been calculated using the CKD EPI equation.     This calculation has not been validated in all clinical situations.     eGFR's persistently <90 mL/min signify possible Chronic Kidney     Disease.  CBC     Status: Abnormal   Collection Time    01/21/13  4:20 AM      Result Value Range   WBC 14.6 (*) 4.0 - 10.5  K/uL   RBC 4.34  4.22 - 5.81 MIL/uL   Hemoglobin 13.8  13.0 - 17.0 g/dL   HCT 39.4  39.0 - 52.0 %   MCV 90.8  78.0 - 100.0 fL   MCH 31.8  26.0 - 34.0 pg   MCHC 35.0  30.0 - 36.0 g/dL   RDW 13.5  11.5 - 15.5 %   Platelets 237  150 - 400 K/uL  PROTIME-INR     Status: None   Collection Time    01/21/13 11:30 AM      Result Value Range   Prothrombin Time 13.8  11.6 - 15.2 seconds   INR 1.08  0.00 - 1.49  APTT     Status: None   Collection Time    01/21/13 11:30 AM      Result Value Range   aPTT 37  24 - 37 seconds   Comment:            IF BASELINE aPTT IS ELEVATED,     SUGGEST PATIENT RISK ASSESSMENT     BE USED TO DETERMINE APPROPRIATE     ANTICOAGULANT THERAPY.   Ct Abdomen Pelvis W Contrast  01/20/2013   CLINICAL DATA:  Left-sided abdominal pain  EXAM: CT ABDOMEN AND PELVIS WITH CONTRAST  TECHNIQUE: Multidetector CT imaging of the abdomen and pelvis was performed using the standard protocol following bolus administration of intravenous contrast.  CONTRAST:  15mL OMNIPAQUE IOHEXOL 300 MG/ML  SOLN  COMPARISON:  03/30/2011  FINDINGS: The lung bases appear clear. No pleural or pericardial effusion.  No suspicious liver abnormality. The gallbladder is normal. No biliary dilatation. The pancreas appears normal. There is significantly diminished enhancement of greater than 90% of the splenic parenchyma consistent with splenic infarct. There is significant calcified atherosclerotic change involving the splenic artery which may be occluded, image 29/series 2. The adrenal glands both appear normal. There are bilateral renal cysts identified. The urinary bladder appears normal. The prostate gland and seminal vesicles are unremarkable.  Advanced calcified atherosclerotic disease affects the abdominal aorta and its branches. No aneurysm. There is no upper abdominal adenopathy. There is no pelvic or inguinal adenopathy identified.  Small hiatal hernia noted. The stomach and the small bowel loops have a  normal course and caliber without obstruction. Normal appearance of the proximal colon. There is multiple distal colonic diverticula identified.  Review of the visualized osseous structures is significant for mild multilevel degenerative disc disease. No aggressive lytic or sclerotic bone lesions identified.  IMPRESSION: 1. Examination is positive for splenic infarction. At least 90% of the splenic parenchyma appears involved. There is significant calcified atherosclerotic disease involving the splenic artery which may be occluded.  2. Atherosclerotic disease.  3. Hiatal hernia   Electronically Signed   By: Kerby Moors M.D.   On: 01/20/2013 21:21   Dg Abd Acute W/chest  01/20/2013   CLINICAL DATA:  Abdominal pain.  EXAM: ACUTE ABDOMEN SERIES (ABDOMEN 2 VIEW & CHEST 1 VIEW)  COMPARISON:  None.  FINDINGS: The heart is mildly enlarged. The mediastinal and hilar contours are within normal limits. There is mild tortuosity and calcification of the thoracic aorta. No acute pulmonary findings. Mild eventration of the right hemidiaphragm is noted.  Two views of the abdomen demonstrate an unremarkable bowel gas pattern. There is scattered air in the small bowel and colon. No distention or air-fluid levels to suggest obstruction. Colonic interposition is noted. The soft tissue shadows of the abdomen are maintained. No worrisome calcifications. The bony structures are intact.  IMPRESSION: Cardiac enlargement but no acute pulmonary findings.  Nonspecific bowel gas pattern. No definite findings for obstruction or perforation.   Electronically Signed   By: Kalman Jewels M.D.   On: 01/20/2013 18:00   2D Echo:  01/21/13:  - Left ventricle: The cavity size was at the upper limits of normal.  The estimated ejection fraction was in the range of 15% to 25%. Diffuse hypokinesis. Probable severe hypokinesis to akinesis of the anteroseptal and apical myocardium. Doppler parameters are consistent with both elevated  ventricular end-diastolic filling pressure and elevated left atrial filling pressure. Increased echogenity at apex; cannot exclude thrombus. Consider definity contrast study. - Aortic valve: Mildly calcified annulus. Trileaflet; moderately calcified leaflets. Valve mobility was restricted. Consider TEE to better define. Mild regurgitation. - Mitral valve: Calcified annulus. Mild regurgitation. - Left atrium: The atrium was severely dilated. - Right ventricle: The cavity size was mildly dilated. - Right atrium: The atrium was mildly dilated. - Atrial septum: No defect or patent foramen ovale was identified. - Tricuspid valve: Mild regurgitation   ROS: General:no colds or fevers, no weight changes Skin:no rashes or ulcers HEENT:no blurred vision, no congestion CV:see HPI PUL:see HPI GI:no diarrhea constipation or melena, no indigestion, + ABD suddsen onset yesterday GU:no hematuria, no dysuria MS:no joint pain, no claudication Neuro:no syncope, no lightheadedness Endo:no diabetes, no thyroid disease   Blood pressure 118/64, pulse 86, temperature 98.7 F (37.1 C), temperature source Oral, resp. rate 16, height 5\' 6"  (1.676 m), weight 178 lb (80.74 kg), SpO2 95.00%. PE: General:Pleasant affect, but obviously not feeling well Skin:Warm and dry, brisk capillary refill, face flushed HEENT:normocephalic, sclera clear, mucus membranes moist Neck:supple, no JVD, Bil carotid bruits  Heart:irreg irreg with soft systolic murmur, no gallup, rub or click Lungs:clear ant.  without rales, rhonchi, or wheezes [id number], + tender, + BS, do not palpate liver spleen or masses Ext:no lower ext edema, 1+ pedal pulses, 2+ radial pulses Neuro:alert and oriented, MAE, follows commands, + facial symmetry    Assessment/Plan Principal Problem:   Splenic infarct Active Problems:   CAD (coronary artery disease),CABG 1993-LIMA to the LAD, SVG to Om and SVG to PDA/PLA, patent on cath 2008.   Chronic  a-fib   Cardiomyopathy, ischemic, improved to 40-45% 06/2012 by echo   History of tobacco use, continues with chewing tobacco   Occlusion and stenosis of carotid artery without mention of cerebral infarction  Plan: Pt did not tolerate coumadin in the past and had not wanted anticoagulation., but with splenic infarct  Now with new echo, ef has dropped though pt without symptoms of CHF.  Dr. Claiborne Billings is recommending TEE on Monday.  Will arrange but will not know time until Monday.  NPO after MN Monday.  Dr. Claiborne Billings to see.   [place] Practitioner Certified [place] Pager [phone] 01/21/2013, 4:06 PM    Patient seen and examined. Agree with assessment and plan. Pt is a very pleasant 79 yo gentleman  Who is 11 yrs s/p CABG. He has a h/o AF, but apparently in the past did not tolerate coumadin, and has not been on anticoagulation. Hx is also notable for old CVA. He presents with large splenic infarct yesterday. Echo today is highly suggestive of embolic etiology with EF now 15 - 25% and wall motion abnormality suggesting LAD infarct since 06/2012 raising suspicion for apical thrombus vs LA thrombus with AF. Agree with heparinization. Will try to arrange for TEE on Monday. Suspect cardiac catheterization will need to be performed to assess graft patency and CAD. Currently no chest pain. Will titrate carvedilol to 6.25 mg bid.   Troy Sine, MD, [facility] 01/21/2013 5:19 PM

## 2013-01-21 NOTE — H&P (Signed)
[facility] Attending Admit Note Patient seen and examined by me, discussed with resident team and I agree with Dr Marella Chimes admission H&P with the following additions.  Briefly, patient is a 27yoM with history of AFib and CAD s/p CABG (1993), s/p CVA, who presented to his primary care physician after sudden onset LUQ abdominal pain that was excruciating, also with N/V at that time.  Sent to [facility] for CT abd/pelvis which identified splenic infarct.  Patient is interviewed with daughter Chong Sicilian) and wife at bedside.  Patient, daughter and wife report that patient had been on warfarin in years past for secondary stroke prevention but he did not tolerate it ("dizzy").  Patient has been maintained on Plavix and aspirin.  He is followed by [facility] from a cardiology standpoint.   This morning his abdominal pain in better controlled. He is awake and able to answer my questions, appears quite tired but in no acute distress.  Neck supple, COR Irregularly irregular.   PULM Clear bilaterally, no rales noted ABD Flat, soft; audible bowel sounds. No focal tenderness on my exam.   A/P: Patient with acute onset LUQ abdominal pain from splenic infarct.  In setting of AFib, raises concern for presence of mural thrombus.  Patient and family deny any prior history of VTE (DVT or PE).  Given his other risk factors, he would warrant indefinite anticoagulation by CHADs2 criteria (and had not tolerated warfarin in the past). 1. Will get TTE today, to look for evidence of source of thrombus; 2. Consultation with [facility] regarding the necessity of TEE for further workup in the setting of his AFib;  3. Anticoagulate with heparin until determination about whether he is a candidate for Xarelto (again, would appreciate Cardiology's input on this).  4. Pain control. Dalbert Mayotte, MD

## 2013-01-21 NOTE — Progress Notes (Signed)
Patient has a temperature of 100.9. Family Medicine notified.  Dr. Birdie Sons called back and ordered Tylenol 650 mg every 6 hours as needed along with an order for blood cultures.

## 2013-01-22 DIAGNOSIS — I4891 Unspecified atrial fibrillation: Secondary | ICD-10-CM

## 2013-01-22 LAB — CBC
Hemoglobin: 13.4 g/dL (ref 13.0–17.0)
MCH: 31.8 pg (ref 26.0–34.0)
MCHC: 34.6 g/dL (ref 30.0–36.0)
MCV: 91.7 fL (ref 78.0–100.0)
Platelets: 206 10*3/uL (ref 150–400)
RBC: 4.22 MIL/uL (ref 4.22–5.81)
WBC: 17.7 10*3/uL — ABNORMAL HIGH (ref 4.0–10.5)

## 2013-01-22 LAB — HEPARIN LEVEL (UNFRACTIONATED): Heparin Unfractionated: 0.56 IU/mL (ref 0.30–0.70)

## 2013-01-22 MED ORDER — SODIUM CHLORIDE 0.9 % IV SOLN
INTRAVENOUS | Status: DC
  Administered 2013-01-22 – 2013-01-23 (×2): via INTRAVENOUS

## 2013-01-22 MED ORDER — PIPERACILLIN-TAZOBACTAM 3.375 G IVPB 30 MIN
3.3750 g | Freq: Once | INTRAVENOUS | Status: AC
  Administered 2013-01-22: 3.375 g via INTRAVENOUS
  Filled 2013-01-22: qty 50

## 2013-01-22 MED ORDER — ASPIRIN EC 81 MG PO TBEC
81.0000 mg | DELAYED_RELEASE_TABLET | Freq: Every day | ORAL | Status: DC
  Administered 2013-01-22 – 2013-01-24 (×3): 81 mg via ORAL
  Filled 2013-01-22 (×3): qty 1

## 2013-01-22 MED ORDER — PIPERACILLIN-TAZOBACTAM 3.375 G IVPB
3.3750 g | Freq: Three times a day (TID) | INTRAVENOUS | Status: DC
  Administered 2013-01-22 – 2013-01-23 (×5): 3.375 g via INTRAVENOUS
  Filled 2013-01-22 (×8): qty 50

## 2013-01-22 MED ORDER — PNEUMOCOCCAL 13-VAL CONJ VACC IM SUSP
0.5000 mL | INTRAMUSCULAR | Status: DC | PRN
  Filled 2013-01-22: qty 0.5

## 2013-01-22 MED ORDER — HAEMOPHILUS B POLYSAC CONJ VAC 7.5 MCG/0.5 ML IM SUSP
0.5000 mL | INTRAMUSCULAR | Status: DC | PRN
  Filled 2013-01-22: qty 0.5

## 2013-01-22 MED ORDER — CARVEDILOL 6.25 MG PO TABS
9.3750 mg | ORAL_TABLET | Freq: Two times a day (BID) | ORAL | Status: DC
  Administered 2013-01-22 – 2013-01-25 (×7): 9.375 mg via ORAL
  Filled 2013-01-22 (×8): qty 1

## 2013-01-22 MED ORDER — MENINGOCOCCAL VAC A,C,Y,W-135 ~~LOC~~ INJ
0.5000 mL | INJECTION | SUBCUTANEOUS | Status: AC | PRN
  Administered 2013-01-25: 0.5 mL via SUBCUTANEOUS
  Filled 2013-01-22 (×2): qty 0.5

## 2013-01-22 MED ORDER — VANCOMYCIN HCL IN DEXTROSE 1-5 GM/200ML-% IV SOLN
1000.0000 mg | Freq: Two times a day (BID) | INTRAVENOUS | Status: DC
  Administered 2013-01-22 – 2013-01-24 (×4): 1000 mg via INTRAVENOUS
  Filled 2013-01-22 (×5): qty 200

## 2013-01-22 MED ORDER — VANCOMYCIN HCL IN DEXTROSE 1-5 GM/200ML-% IV SOLN
1000.0000 mg | Freq: Once | INTRAVENOUS | Status: AC
  Administered 2013-01-22: 1000 mg via INTRAVENOUS
  Filled 2013-01-22: qty 200

## 2013-01-22 NOTE — Progress Notes (Signed)
Family Medicine Teaching Service Daily Progress Note Intern Pager: [phone]  Patient name: Gregory [name] Medical record number: [id number] Date of birth: 02-11-1933 Age: 79 y.o. Gender: male  Primary Care Provider: Kennon Portela, MD Consultants: Cardiology Code Status: Full code  Pt Overview and Major Events to Date:  01/20/13: CT abdomen showing 90% of splenic infarct 01/21/13: Heparin started for anticoagulation  01/22/13: Leukocytosis, fever, blood cultures drawn, vanc/zosyn started  Assessment and Plan: ERIS HALTEMAN is a 79 y.o. 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 PMH is significant for CHF, chronic atrial fibrillation, ischemic cardiomyopathy, CAD and CABG in 1993.   # Splenic infarct:  - TTE: EF 15-25% with hypokinesis/akinesis suggestive of occlusion of LAD bypass graft. Could not exclude cardiac thrombus.  - TEE to be scheduled for tomorrow for further evaluation of embolic source.  - Previously on plavix, ASA for AFib; patient apparently has not tolerated warfarin in the past; will likely be on xarelto/pradaxa.   - Now on Plavix and heparin gtt.  - He will need Haemophilus, meningococcus and streptococcus immunizations PTD.  # Leukocytosis and fever to 100.43F last night.  - WBC 13.6 > 17.7 - Blood cultures drawn and empiric broad spectrum coverage started: vancomycin, zosyn per pharmacy  # Chronic atrial fibrillation: currently on plavix and heparin - continue coreg for rate control  - BPs stable  # Systolic CHF and ischemic cardiomyopathy and h/o CAD: CABGx4 in 1993, patent grafts on cath in 2008. Echo from 06/23/2012 showing EF: 40-45%; now with EF 15-25%. Pt declines ICD. Will need a cardiac catheterization to assess graft patency.  - Continue coreg 6.25mg  po BID, lisinopril 10mg  - Hold lasix for now - On statin - gentle hydration D5 1/2NS @50cc /hr  # h/o CVA:  - BP control  - plavix and ASA  at home, continue plavix  # Carotid artery stenosis, bilateral, moderate-severe.  - Dr. Oneida Alar recommended CEA and patient opted for medical management only  # Hyponatremia 133 - Change to NS @ 125 cc/hr, recheck BMP in AM  FEN/GI: NS at 125 cc/hr, heart healthy diet ordered, NPO after midnight Prophylaxis: heparin    Disposition: Continue to monitor closely for continued embolization/sepsis, TEE tomorrow.   Subjective: Pt slept well, reports controlled pain in LUQ only on deep inspiration. No CP/SOB. Felt febrile overnight, responsive to tylenol. Wife at bedside states he looks improved. Pt confirmed full code, no ICD and consent to TEE.   Objective: Temp:  [97.9 F (36.6 C)-100.9 F (38.3 C)] 97.9 F (36.6 C) (11/09 0536) Pulse Rate:  [82-93] 82 (11/09 0536) Resp:  [16-18] 18 (11/09 0536) BP: (116-125)/(54-64) 120/54 mmHg (11/09 0536) SpO2:  [95 %-98 %] 98 % (11/09 0536) Physical Exam: General: tired elderly man in no acute distress Cardiovascular: S1S2, RRR, no murmur Respiratory: CTA b/l Abdomen: mild tenderness in left upper quadrant, no rebound, no guarding.  Extremities: no edema  Laboratory:  Recent Labs Lab 01/20/13 1625 01/21/13 0420 01/22/13 0550  WBC 13.6* 14.6* 17.7*  HGB 14.3 13.8 13.4  HCT 44.9 39.4 38.7*  PLT  --  237 206    Recent Labs Lab 01/20/13 1622 01/20/13 1839 01/21/13 0420  NA 139  --  133*  K 4.7  --  4.3  CL 100  --  99  CO2 28  --  23  BUN 33* 32* 30*  CREATININE 1.14 1.12 0.95  CALCIUM 9.4  --  8.6  PROT 7.3  --   --   BILITOT 0.7  --   --   ALKPHOS 77  --   --   ALT 20  --   --   AST 23  --   --   GLUCOSE 131*  --  145*    Vance Gather, MD 01/22/2013, 8:43 AM PGY-1, [facility] Intern pager: [phone] Amion password: [email or web address], text pages welcome

## 2013-01-22 NOTE — Progress Notes (Signed)
Patient seen and examined by me today, discussed with Dr Bonner Puna and I agree with his assessment and plan as per note. See my separate note for further details.  Dalbert Mayotte, MD

## 2013-01-22 NOTE — Progress Notes (Addendum)
Subjective:  Complains of mild abdominal soreness when takes a deep breath.  Objective:   Vital Signs in the last 24 hours: Temp:  [97.9 F (36.6 C)-100.9 F (38.3 C)] 97.9 F (36.6 C) (11/09 0536) Pulse Rate:  [82-93] 82 (11/09 0536) Resp:  [16-18] 18 (11/09 0536) BP: (110-120)/(54-64) 110/55 mmHg (11/09 1038) SpO2:  [95 %-98 %] 98 % (11/09 0536)  Intake/Output from previous day:    Medications: . atorvastatin  40 mg Oral q1800  . carvedilol  6.25 mg Oral BID WC  . clopidogrel  75 mg Oral Daily  . lisinopril  10 mg Oral Daily  . piperacillin-tazobactam (ZOSYN)  IV  3.375 g Intravenous Q8H  . vancomycin  1,000 mg Intravenous Q12H    . sodium chloride 125 mL/hr at 01/22/13 1223  . heparin 1,300 Units/hr (01/22/13 [id number])    Physical Exam:   General appearance: alert, cooperative and warm to touch Neck: no JVD, supple, symmetrical, trachea midline and thyroid not enlarged, symmetric, no tenderness/mass/nodules Lungs: clear to auscultation bilaterally Heart: regular rate and rhythm and 1/6 sem Abdomen: mild tenderness left upper quadrant Extremities: no edema, redness or tenderness in the calves or thighs   Rate:  80  Rhythm:sinus  Lab Results:    Recent Labs  01/20/13 1622 01/20/13 1839 01/21/13 0420  NA 139  --  133*  K 4.7  --  4.3  CL 100  --  99  CO2 28  --  23  GLUCOSE 131*  --  145*  BUN 33* 32* 30*  CREATININE 1.14 1.12 0.95   No results found for this basename: TROPONINI, CK, MB,  in the last 72 hours Hepatic Function Panel  Recent Labs  01/20/13 1622  PROT 7.3  ALBUMIN 4.0  AST 23  ALT 20  ALKPHOS 77  BILITOT 0.7    Recent Labs  01/21/13 1130  INR 1.08   BNP (last 3 results) No results found for this basename: PROBNP,  in the last 8760 hours  Lipid Panel     Component Value Date/Time   CHOL 172 11/30/2012 1049   TRIG 72 11/30/2012 1049   HDL 52 11/30/2012 1049   CHOLHDL 3.3 11/30/2012 1049   VLDL 14 11/30/2012 1049   LDLCALC  106* 11/30/2012 1049      Imaging:  Ct Abdomen Pelvis W Contrast  01/20/2013   CLINICAL DATA:  Left-sided abdominal pain  EXAM: CT ABDOMEN AND PELVIS WITH CONTRAST  TECHNIQUE: Multidetector CT imaging of the abdomen and pelvis was performed using the standard protocol following bolus administration of intravenous contrast.  CONTRAST:  115mL OMNIPAQUE IOHEXOL 300 MG/ML  SOLN  COMPARISON:  03/30/2011  FINDINGS: The lung bases appear clear. No pleural or pericardial effusion.  No suspicious liver abnormality. The gallbladder is normal. No biliary dilatation. The pancreas appears normal. There is significantly diminished enhancement of greater than 90% of the splenic parenchyma consistent with splenic infarct. There is significant calcified atherosclerotic change involving the splenic artery which may be occluded, image 29/series 2. The adrenal glands both appear normal. There are bilateral renal cysts identified. The urinary bladder appears normal. The prostate gland and seminal vesicles are unremarkable.  Advanced calcified atherosclerotic disease affects the abdominal aorta and its branches. No aneurysm. There is no upper abdominal adenopathy. There is no pelvic or inguinal adenopathy identified.  Small hiatal hernia noted. The stomach and the small bowel loops have a normal course and caliber without obstruction. Normal appearance of the proximal colon. There  is multiple distal colonic diverticula identified.  Review of the visualized osseous structures is significant for mild multilevel degenerative disc disease. No aggressive lytic or sclerotic bone lesions identified.  IMPRESSION: 1. Examination is positive for splenic infarction. At least 90% of the splenic parenchyma appears involved. There is significant calcified atherosclerotic disease involving the splenic artery which may be occluded.  2. Atherosclerotic disease.  3. Hiatal hernia   Electronically Signed   By: Kerby Moors M.D.   On: 01/20/2013  21:21   Dg Abd Acute W/chest  01/20/2013   CLINICAL DATA:  Abdominal pain.  EXAM: ACUTE ABDOMEN SERIES (ABDOMEN 2 VIEW & CHEST 1 VIEW)  COMPARISON:  None.  FINDINGS: The heart is mildly enlarged. The mediastinal and hilar contours are within normal limits. There is mild tortuosity and calcification of the thoracic aorta. No acute pulmonary findings. Mild eventration of the right hemidiaphragm is noted.  Two views of the abdomen demonstrate an unremarkable bowel gas pattern. There is scattered air in the small bowel and colon. No distention or air-fluid levels to suggest obstruction. Colonic interposition is noted. The soft tissue shadows of the abdomen are maintained. No worrisome calcifications. The bony structures are intact.  IMPRESSION: Cardiac enlargement but no acute pulmonary findings.  Nonspecific bowel gas pattern. No definite findings for obstruction or perforation.   Electronically Signed   By: Kalman Jewels M.D.   On: 01/20/2013 18:00      Assessment/Plan:   Principal Problem:   Splenic infarct Active Problems:   CAD (coronary artery disease),CABG 1993-LIMA to the LAD, SVG to Om and SVG to PDA/PLA, patent on cath 2008.   Chronic a-fib   Cardiomyopathy, ischemic, improved to 40-45% 06/2012 by echo   History of tobacco use, continues with chewing tobacco   Occlusion and stenosis of carotid artery without mention of cerebral infarction  Will set up for TEE tomorrow. Suspect will need subsequent cardiac cath in light of change in  LV dysfunction and wall motion abnormality to assess graft patency. No chest pain. Currently on Vanc and Zosyn. Now on heparin. Will titrate coreg to 9.375 mg bid. Will hold Plavix and start ASA 81 mg.    Troy Sine, MD, [facility] 01/22/2013, 1:41 PM

## 2013-01-22 NOTE — Progress Notes (Signed)
Patients temperature 98.2.

## 2013-01-22 NOTE — Progress Notes (Signed)
ANTICOAGULATION CONSULT NOTE - [place] for heparin Indication: splenic infarct  Allergies  Allergen Reactions  . Percocet [Oxycodone-Acetaminophen] Itching and Nausea Only    Patient Measurements: Height: 5\' 6"  (167.6 cm) Weight: 178 lb (80.74 kg) IBW/kg (Calculated) : 63.8   Vital Signs: Temp: 97.9 F (36.6 C) (11/09 0536) Temp src: Oral (11/09 0536) BP: 120/54 mmHg (11/09 0536) Pulse Rate: 82 (11/09 0536)  Labs:  Recent Labs  01/20/13 1622 01/20/13 1625 01/20/13 1839 01/21/13 0420 01/21/13 1130 01/21/13 2024 01/22/13 0550  HGB  --  14.3  --  13.8  --   --  13.4  HCT  --  44.9  --  39.4  --   --  38.7*  PLT  --   --   --  237  --   --  206  APTT  --   --   --   --  37  --   --   LABPROT  --   --   --   --  13.8  --   --   INR  --   --   --   --  1.08  --   --   HEPARINUNFRC  --   --   --   --   --  0.67 0.56  CREATININE 1.14  --  1.12 0.95  --   --   --     Estimated Creatinine Clearance: 64 ml/min (by C-G formula based on Cr of 0.95).   Medical History: Past Medical History  Diagnosis Date  . Coronary artery disease   . CAD (coronary artery disease),CABG 1993-LIMA to the LAD, SVG to Om and SVG to PDA/PLA, patent on cath 2008. 2003  . CHF (congestive heart failure)   . Heart murmur   . Atrial fibrillation     permanent  . Hypertension   . High cholesterol   . Heart attack 1980's  . Pneumonia     "in the last 5 yrs" (01/22/2013)  . GERD (gastroesophageal reflux disease)   . Stroke     "slightly drags left foot since; recovered qthing else" (01/21/2013)  . Kidney stones     "passed all but one time when he had to have lithotripsy" (01/21/2013)  . Cardiomyopathy 2008    EF 20% but now improved to 40-45%  . 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 aspirin EC 81 MG tablet Take 81 mg by mouth daily.      . carvedilol (COREG) 3.125 MG tablet Take 3.125 mg  by mouth 2 (two) times daily with a meal.      . clopidogrel (PLAVIX) 75 MG tablet Take 75 mg by mouth daily.      . furosemide (LASIX) 40 MG tablet Take 40 mg by mouth daily.      Marland Kitchen HYDROcodone-acetaminophen (HYCET) 7.5-325 mg/15 ml solution Take 10 mLs by mouth 4 (four) times daily as needed for moderate pain.      Marland Kitchen lisinopril (PRINIVIL,ZESTRIL) 10 MG tablet Take 10 mg by mouth daily.      . Multiple Vitamins-Minerals (MULTIVITAMIN PO) Take 1 tablet by mouth daily.      . pantoprazole (PROTONIX) 40 MG tablet Take 40 mg by mouth daily.      . potassium chloride SA (K-DUR,KLOR-CON) 20 MEQ tablet Take 20 mEq by mouth daily.      . simvastatin (ZOCOR) 80 MG tablet Take  80 mg by mouth at bedtime.        Assessment: 80 yo male admitted with left upper quadrant abdominal pain found to have splenic infarct.  Hgb/hct and platelets stable.  Scr trending down.  Patient has not tolerated warfarin in the past per MD note.  Baseline INR within normal limits on admit.    Heparin level therapeutic on 1300 units/hr.  No bleeding or complications noted per chart notes.  Goal of Therapy:  Heparin Level: 0.3-0.7 Monitor platelets by anticoagulation protocol: Yes  Plan:  1. Continue IV heparin at current rate (1300 units/hr). 2. F/u AM heparin level and CBC.  Thank you, Vivia Ewing, PharmD Clinical Pharmacist - Resident Pager: [phone] Pharmacy: [phone] 01/22/2013 8:42 AM

## 2013-01-22 NOTE — Progress Notes (Signed)
ANTIBIOTIC CONSULT NOTE - INITIAL  Pharmacy Consult for Vancocin and Zosyn Indication: splenic infart w/ possible endocarditis/bacteremia  Allergies  Allergen Reactions  . Percocet [Oxycodone-Acetaminophen] Itching and Nausea Only    Patient Measurements: Height: 5\' 6"  (167.6 cm) Weight: 178 lb (80.74 kg) IBW/kg (Calculated) : 63.8  Vital Signs: Temp: 98.2 F (36.8 C) (11/09 0028) Temp src: Oral (11/09 0028) BP: 116/61 mmHg (11/08 2233) Pulse Rate: 93 (11/08 2233)  Labs:  Recent Labs  01/20/13 1622 01/20/13 1625 01/20/13 1839 01/21/13 0420  WBC  --  13.6*  --  14.6*  HGB  --  14.3  --  13.8  PLT  --   --   --  237  CREATININE 1.14  --  1.12 0.95   Estimated Creatinine Clearance: 64 ml/min (by C-G formula based on Cr of 0.95).   Microbiology: No results found for this or any previous visit (from the past 720 hour(s)).  Medical History: Past Medical History  Diagnosis Date  . Coronary artery disease   . CAD (coronary artery disease),CABG 1993-LIMA to the LAD, SVG to Om and SVG to PDA/PLA, patent on cath 2008. 2003  . CHF (congestive heart failure)   . Heart murmur   . Atrial fibrillation     permanent  . Hypertension   . High cholesterol   . Heart attack 1980's  . Pneumonia     "in the last 5 yrs" (01/22/2013)  . GERD (gastroesophageal reflux disease)   . Stroke     "slightly drags left foot since; recovered qthing else" (01/21/2013)  . Kidney stones     "passed all but one time when he had to have lithotripsy" (01/21/2013)  . Cardiomyopathy 2008    EF 20% but now improved to 40-45%  . 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 aspirin EC 81 MG tablet Take 81 mg by mouth daily.      . carvedilol (COREG) 3.125 MG tablet Take 3.125 mg by mouth 2 (two) times daily with a meal.      . clopidogrel (PLAVIX) 75 MG tablet Take 75 mg by mouth daily.      . furosemide (LASIX) 40 MG tablet Take  40 mg by mouth daily.      Marland Kitchen HYDROcodone-acetaminophen (HYCET) 7.5-325 mg/15 ml solution Take 10 mLs by mouth 4 (four) times daily as needed for moderate pain.      Marland Kitchen lisinopril (PRINIVIL,ZESTRIL) 10 MG tablet Take 10 mg by mouth daily.      . Multiple Vitamins-Minerals (MULTIVITAMIN PO) Take 1 tablet by mouth daily.      . pantoprazole (PROTONIX) 40 MG tablet Take 40 mg by mouth daily.      . potassium chloride SA (K-DUR,KLOR-CON) 20 MEQ tablet Take 20 mEq by mouth daily.      . simvastatin (ZOCOR) 80 MG tablet Take 80 mg by mouth at bedtime.       Scheduled:  . atorvastatin  40 mg Oral q1800  . carvedilol  6.25 mg Oral BID WC  . clopidogrel  75 mg Oral Daily  . lisinopril  10 mg Oral Daily   Infusions:  . dextrose 5 % and 0.45% NaCl 50 mL/hr at 01/20/13 2320  . heparin 1,300 Units/hr (01/21/13 1158)    Assessment: 79yo male admitted w/ LUQ pain w/ splenic infarct on CT, now w/ fever and concern for endocarditis/bacteremia, to begin IV ABX.  Goal of Therapy:  Vancomycin trough level 15-20 mcg/ml  Plan:  Will begin vancomycin 1000mg  IV Q12H and Zosyn 3.375g IV Q8H and monitor CBC, Cx, levels prn.  Wynona Neat, PharmD, BCPS  01/22/2013,12:35 AM

## 2013-01-22 NOTE — Progress Notes (Signed)
FMTS Attending Note Patient seen and examined by me this morning, son Louie Casa present at bedside.  Patient reports markedly decreased pain in abdomen; interest in eating.  No more emesis since admission.  Fever overnight; blood cultures ordered and started on vanc/zosyn.  ECHO report reviewed; concern for thrombus (now with fever, concern for bacteremia/endocarditis). Also with markedly reduced EF.  Remains on heparin gtt. A/P: Patient with splenic infarct; AFib not anticoagulated at admission, now on heparin gtt.  Plan for TEE; likely cardiac cath before discharge to assess status of CABG grafts.  [facility] input.  Given patient's historical intolerance to warfarin, wonder if Xarelto or Pradaxa are appropriate anticoagulants for him at discharge.   Continue to watch cultures, fever curve, WBC count.  Patient for diet order this morning. Dalbert Mayotte, MD

## 2013-01-23 ENCOUNTER — Encounter (HOSPITAL_COMMUNITY): Payer: Self-pay

## 2013-01-23 ENCOUNTER — Encounter (HOSPITAL_COMMUNITY)
Admission: AD | Disposition: A | Discharge status: Home / Self Care | Payer: Self-pay | Source: Ambulatory Visit | Attending: Family Medicine

## 2013-01-23 DIAGNOSIS — I6350 Cerebral infarction due to unspecified occlusion or stenosis of unspecified cerebral artery: Secondary | ICD-10-CM

## 2013-01-23 HISTORY — PX: TEE WITHOUT CARDIOVERSION: SHX5443

## 2013-01-23 LAB — BASIC METABOLIC PANEL
BUN: 21 mg/dL (ref 6–23)
CO2: 24 mEq/L (ref 19–32)
CO2: 25 mEq/L (ref 19–32)
Calcium: 7.6 mg/dL — ABNORMAL LOW (ref 8.4–10.5)
Chloride: 100 mEq/L (ref 96–112)
Chloride: 97 mEq/L (ref 96–112)
Creatinine, Ser: 1.15 mg/dL (ref 0.50–1.35)
Creatinine, Ser: 1.13 mg/dL (ref 0.50–1.35)
GFR calc Af Amer: 68 mL/min — ABNORMAL LOW (ref >90)
GFR calc non Af Amer: 59 mL/min — ABNORMAL LOW (ref >90)
GFR calc non Af Amer: 60 mL/min — ABNORMAL LOW (ref >90)
Glucose, Bld: 194 mg/dL — ABNORMAL HIGH (ref 70–99)
Potassium: 3.4 mEq/L — ABNORMAL LOW (ref 3.5–5.1)
Sodium: 133 mEq/L — ABNORMAL LOW (ref 135–145)
Sodium: 132 mEq/L — ABNORMAL LOW (ref 135–145)

## 2013-01-23 LAB — CBC
Platelets: 172 10*3/uL (ref 150–400)
RBC: 3.6 MIL/uL — ABNORMAL LOW (ref 4.22–5.81)
RDW: 13.9 % (ref 11.5–15.5)
WBC: 15.5 10*3/uL — ABNORMAL HIGH (ref 4.0–10.5)

## 2013-01-23 LAB — SURGICAL PCR SCREEN: Staphylococcus aureus: NEGATIVE

## 2013-01-23 LAB — HEPARIN LEVEL (UNFRACTIONATED): Heparin Unfractionated: 0.47 IU/mL (ref 0.30–0.70)

## 2013-01-23 SURGERY — ECHOCARDIOGRAM, TRANSESOPHAGEAL
Anesthesia: Moderate Sedation

## 2013-01-23 MED ORDER — BUTAMBEN-TETRACAINE-BENZOCAINE 2-2-14 % EX AERO
INHALATION_SPRAY | CUTANEOUS | Status: DC | PRN
  Administered 2013-01-23: 2 via TOPICAL

## 2013-01-23 MED ORDER — FENTANYL CITRATE 0.05 MG/ML IJ SOLN
INTRAMUSCULAR | Status: DC | PRN
  Administered 2013-01-23: 25 ug via INTRAVENOUS

## 2013-01-23 MED ORDER — SODIUM CHLORIDE 0.9 % IV SOLN: INTRAVENOUS | Status: DC

## 2013-01-23 MED ORDER — MIDAZOLAM HCL 5 MG/ML IJ SOLN
INTRAMUSCULAR | Status: AC
  Filled 2013-01-23: qty 2

## 2013-01-23 MED ORDER — POTASSIUM CHLORIDE 10 MEQ/100ML IV SOLN
10.0000 meq | Freq: Once | INTRAVENOUS | Status: AC
  Administered 2013-01-23: 10 meq via INTRAVENOUS
  Filled 2013-01-23: qty 100

## 2013-01-23 MED ORDER — FENTANYL CITRATE 0.05 MG/ML IJ SOLN
INTRAMUSCULAR | Status: AC
  Filled 2013-01-23: qty 2

## 2013-01-23 MED ORDER — MIDAZOLAM HCL 10 MG/2ML IJ SOLN
INTRAMUSCULAR | Status: DC | PRN
  Administered 2013-01-23: 2 mg via INTRAVENOUS
  Administered 2013-01-23: 1 mg via INTRAVENOUS

## 2013-01-23 NOTE — Progress Notes (Signed)
Attending Addendum  I examined the patient and discussed the assessment and plan with Dr. Bonner Puna. I have reviewed the note and agree. Patient awaiting TEE. May have diet after TEE. appreciate cardiology care and recommendations.     Boykin Nearing, [place]

## 2013-01-23 NOTE — Progress Notes (Signed)
ANTICOAGULATION CONSULT NOTE - [place] for heparin Indication: splenic infarct  Allergies  Allergen Reactions  . Percocet [Oxycodone-Acetaminophen] Itching and Nausea Only    Labs:  Recent Labs  01/20/13 1839  01/21/13 0420 01/21/13 1130 01/21/13 2024 01/22/13 0550 01/23/13 0543  HGB  --   < > 13.8  --   --  13.4 11.5*  HCT  --   --  39.4  --   --  38.7* 33.3*  PLT  --   --  237  --   --  206 172  APTT  --   --   --  37  --   --   --   LABPROT  --   --   --  13.8  --   --   --   INR  --   --   --  1.08  --   --   --   HEPARINUNFRC  --   --   --   --  0.67 0.56 0.47  CREATININE 1.12  --  0.95  --   --   --  1.15  < > = values in this interval not displayed.  Estimated Creatinine Clearance: 53.5 ml/min (by C-G formula based on Cr of 1.15).   Assessment: 79 yo male admitted with left upper quadrant abdominal pain found to have splenic infarct.  Hgb/hct and platelets stable.  Scr trending down.  Patient has not tolerated warfarin in the past per MD note.  Baseline INR within normal limits on admit.    Heparin level therapeutic on 1300 units/hr.  No bleeding or complications noted per chart notes.  Goal of Therapy:  Heparin Level: 0.3-0.7 Monitor platelets by anticoagulation protocol: Yes  Plan:  1. Continue IV heparin at current rate (1300 units/hr). 2. F/u AM heparin level and CBC.  Thank you, Anette Guarneri, PharmD [phone] 01/23/2013 11:05 AM

## 2013-01-23 NOTE — Op Note (Signed)
INDICATIONS: Embolic splenic infarct  PROCEDURE:   Informed consent was obtained prior to the procedure. The risks, benefits and alternatives for the procedure were discussed and the patient comprehended these risks.  Risks include, but are not limited to, cough, sore throat, vomiting, nausea, somnolence, esophageal and stomach trauma or perforation, bleeding, low blood pressure, aspiration, pneumonia, infection, trauma to the teeth and death.    After a procedural time-out, the oropharynx was anesthetized with 20% benzocaine spray. The patient was given 3 mg versed and 25 mcg fentanyl IV for moderate sedation.   The transesophageal probe was inserted in the esophagus and stomach without difficulty and multiple views were obtained.  The patient was kept under observation until the patient left the procedure room.  The patient left the procedure room in stable condition.   Agitated microbubble saline contrast was administered.  COMPLICATIONS:    There were no immediate complications.  FINDINGS:  The left atrial appendage is entirely occupied by thrombus. Moderate to severely dilated left atrium with dense spontaneous echo contrast. Could not cross the lower esophageal sphincter.  Apical wall motion abnormality is seen. No PFO/ASD  RECOMMENDATIONS:     Lifelong anticoagulation.  Time Spent Directly with the Patient:  35 minutes   Gregory [name] 01/23/2013, 3:31 PM

## 2013-01-23 NOTE — Progress Notes (Signed)
Subjective: No chest pain, no SOB - wife relates in lounge chair at home he also has periods of apnea.      Objective: Vital signs in last 24 hours: Temp:  [97.7 F (36.5 C)-99.2 F (37.3 C)] 97.7 F (36.5 C) (11/10 0553) Pulse Rate:  [40-83] 70 (11/10 0553) Resp:  [12-20] 15 (11/10 0553) BP: (96-110)/(47-56) 110/56 mmHg (11/10 0553) SpO2:  [83 %-100 %] 99 % (11/10 0553) Weight:  [182 lb 15.7 oz (83 kg)] 182 lb 15.7 oz (83 kg) (11/10 0500) Weight change:  Last BM Date: 01/22/13 Intake/Output from previous day: +1738  Wt is up to 182 from 178 since admit, this is first day he is + I&O 11/09 0701 - 11/10 0700 In: 2138.3 [P.O.:100; I.V.:1738.3; IV Piggyback:300] Out: 400 [Urine:400] Intake/Output this shift:    PE: General:Pleasant affect, NAD, color mildly flushed overall looks better than Sat and feels better. Skin:Warm and dry, brisk capillary refill HEENT:normocephalic, sclera clear, mucus membranes moist Neck:supple, no JVD  Heart  irreg irreg with soft systolic murmur, no gallup, rub or click Lungs:clear, ant without rales, rhonchi, or wheezes [id number], mildly tender lt upper quad, + BS Ext:no lower ext edema, 2+ pedal pulses, 2+ radial pulses Neuro:alert and oriented, MAE, follows commands, + facial symmetry   Lab Results:  Recent Labs  01/22/13 0550 01/23/13 0543  WBC 17.7* 15.5*  HGB 13.4 11.5*  HCT 38.7* 33.3*  PLT 206 172   BMET  Recent Labs  01/21/13 0420 01/23/13 0543  NA 133* 133*  K 4.3 3.4*  CL 99 100  CO2 23 24  GLUCOSE 145* 96  BUN 30* 24*  CREATININE 0.95 1.15  CALCIUM 8.6 7.4*   No results found for this basename: TROPONINI, CK, MB,  in the last 72 hours  Lab Results  Component Value Date   CHOL 172 11/30/2012   HDL 52 11/30/2012   LDLCALC 106* 11/30/2012   TRIG 72 11/30/2012   CHOLHDL 3.3 11/30/2012   Lab Results  Component Value Date   HGBA1C  Value: 6.0 (NOTE) The ADA recommends the following therapeutic goal for  glycemic control related to Hgb A1c measurement: Goal of therapy: <6.5 Hgb A1c  Reference: American Diabetes Association: Clinical Practice Recommendations 2010, Diabetes Care, 2010, 33: (Suppl  1). 05/09/2009     Lab Results  Component Value Date   TSH 2.785 11/30/2012    Hepatic Function Panel  Recent Labs  01/20/13 1622  PROT 7.3  ALBUMIN 4.0  AST 23  ALT 20  ALKPHOS 77  BILITOT 0.7   Studies/Results: ECHO:   - Left ventricle: The cavity size was at the upper limits of normal. The estimated ejection fraction was in the range of 15% to 25%. Diffuse hypokinesis. Probable severe hypokinesis to akinesis of the anteroseptal and apical myocardium. Doppler parameters are consistent with both elevated ventricular end-diastolic filling pressure and elevated left atrial filling pressure. Increased echogenity at apex; cannot exclude thrombus. Consider definity contrast study. - Aortic valve: Mildly calcified annulus. Trileaflet; moderately calcified leaflets. Valve mobility was restricted. Consider TEE to better define. Mild regurgitation. - Mitral valve: Calcified annulus. Mild regurgitation. - Left atrium: The atrium was severely dilated. - Right ventricle: The cavity size was mildly dilated. - Right atrium: The atrium was mildly dilated. - Atrial septum: No defect or patent foramen ovale was identified. - Tricuspid valve: Mild regurgitation   Medications: I have reviewed the patient's current medications. Scheduled Meds: . aspirin  EC  81 mg Oral Daily  . atorvastatin  40 mg Oral q1800  . carvedilol  9.375 mg Oral BID WC  . lisinopril  10 mg Oral Daily  . piperacillin-tazobactam (ZOSYN)  IV  3.375 g Intravenous Q8H  . potassium chloride  10 mEq Intravenous Once  . vancomycin  1,000 mg Intravenous Q12H   Continuous Infusions: . heparin 1,300 Units/hr (01/23/13 0207)   PRN Meds:.acetaminophen, haemophilus B conjugate vaccine, meningococcal vaccine, morphine injection,  ondansetron (ZOFRAN) IV, ondansetron, pneumococcal 13-valent conjugate vaccine  Assessment/Plan: Principal Problem:   Splenic infarct Active Problems:   CAD (coronary artery disease),CABG 1993-LIMA to the LAD, SVG to Om and SVG to PDA/PLA, patent on cath 2008.   Chronic a-fib   Cardiomyopathy, ischemic, improved to 40-45% 06/2012 by echo, now 15-25% 01/21/13   History of tobacco use, continues with chewing tobacco   Occlusion and stenosis of carotid artery without mention of cerebral infarction  PLAN:  For TEE today,  eval for thrombus  Possible cath this admit to eval CAD considering drop in EF Carotid disease and pt has preferred medical therapy to surgery at this point. Blood cultures pending - on Zosyn During the night HR down to 40s, sleep apnea per RNs note. With desat.   Continues on IV heparin   LOS: 3 days   Time spent with pt. :15 minutes. [facility] R  Nurse Practitioner Certified Pager [phone] 01/23/2013, 11:06 AM   I have seen and examined the patient along with [facility] R  Nurse Practitioner Certified.  I have reviewed the chart, notes and new data.  I agree with NP's note.  Key new complaints: none Key examination changes: irregular rhythm, AF on monitor Key new findings / data: possible sleep apnea  PLAN: TEE with cautious sedation today. He has had some swallowing difficulties (distal esophageal stricture leading to food impaction) and will probably not get transgastric views. This procedure has been fully reviewed with the patient and written informed consent has been obtained.   Sanda Klein, MD, [place] [phone] 01/23/2013, 2:42 PM

## 2013-01-23 NOTE — Progress Notes (Signed)
Pt has periodic episodes of apnea while sleeping that last approximately 5 to 10 seconds. His heart rate dropped to 40 and his O2 sats decreased to 83%. When he resumes regular respirations, HR increased to 70 and sats to 94%. Nasal canula at 2l/min started. Oxygen levels remained above 90%. Pt continues to have atrial fib and periodic episodes of bigeminy.

## 2013-01-23 NOTE — Progress Notes (Signed)
  Echocardiogram Echocardiogram Transesophageal has been performed.  Gregory [name] 01/23/2013, 4:03 PM

## 2013-01-23 NOTE — Progress Notes (Signed)
UR completed. Drinda Belgard RN CCM Case Mgmt 

## 2013-01-23 NOTE — Progress Notes (Signed)
Family Medicine Teaching Service Daily Progress Note Intern Pager: [phone]  Patient name: Gregory [name] Medical record number: [id number] Date of birth: May 09, 1932 Age: 79 y.o. Gender: male  Primary Care Provider: Kennon Portela, MD Consultants: Cardiology Code Status: Full code  Pt Overview and Major Events to Date:  01/20/13: CT abdomen showing 90% of splenic infarct 01/21/13: Heparin started for anticoagulation  01/22/13: Leukocytosis, fever, blood cultures drawn, vanc/zosyn started 01/23/13: TEE  Assessment and Plan: Gregory [name] is a 79 y.o. 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 PMH is significant for CHF, chronic atrial fibrillation, ischemic cardiomyopathy, CAD and CABG in 1993.   # Splenic infarct: Cardiac embolization suspected.  - TTE: EF 15-25% with hypokinesis/akinesis suggestive of occlusion of LAD bypass graft. Could not exclude cardiac thrombus.  - TEE today for further evaluation of embolic source.  - Apparently has not tolerated warfarin in the past; Would appreciate cardiology input on oral anticoagulation on discharge.  - Now on ASA and heparin gtt.  - Haemophilus, meningococcus and streptococcus immunizations ordered PTD. May order antibiotics for home prn.   # Leukocytosis - Last fever 100.76F 11/08 - WBC 13.6 > 17.7 > 15.5 - Blood cultures NGTD - Vancomycin, zosyn ([id number]  # Systolic CHF and ischemic cardiomyopathy, chronic AFib, and h/o CAD: CABGx4 in 1993, patent grafts on cath in 2008. Echo from 06/23/2012 showing EF: 40-45%; now with EF 15-25%. Pt declines ICD. Will need a cardiac catheterization to assess graft patency.  - Coreg 9.375mg  po BID rate control, lisinopril 10mg  - Up 1.7L from admission and able to [place]. Will restart home lasix.  - On statin  # h/o CVA: BP control  - plavix and ASA at home; Per cardiology, discontinue plavix, continue aspirin.   # Hyponatremia 133 >  133 - Recheck BMP in AM  # Hypokalemia 3.4 - Will likely improve with diet, will give one run today and recheck tomorrow  # Carotid artery stenosis, bilateral, moderate-severe.  - Dr. Oneida Alar recommended CEA and patient opted for medical management only  FEN/GI: MIVF: KVO, Restart diet after NPO for procedure  Prophylaxis: heparin gtt    Disposition: Continue to monitor closely for continued embolization/sepsis, TEE today, probable cath.   Subjective: Pt slept well, reports controlled pain in LUQ only on deep inspiration. No CP/SOB. He is hungry.   Objective: Temp:  [97.9 F (36.6 C)-99.2 F (37.3 C)] 98.6 F (37 C) (11/09 2046) Pulse Rate:  [40-83] 79 (11/09 2255) Resp:  [12-20] 16 (11/09 2255) BP: (96-120)/(47-55) 96/50 mmHg (11/09 2046) SpO2:  [83 %-100 %] 100 % (11/09 2255) Physical Exam: General: Elderly man in bed in no acute distress Cardiovascular: S1S2, rate is regular, no murmurs Respiratory: No increased WOB, CTAB, on O2 by [place] Abdomen: mild tenderness in left upper quadrant, no rebound, no guarding.  Extremities: no edema/calf tenderness  Laboratory:  Recent Labs Lab 01/20/13 1625 01/21/13 0420 01/22/13 0550  WBC 13.6* 14.6* 17.7*  HGB 14.3 13.8 13.4  HCT 44.9 39.4 38.7*  PLT  --  237 206    Recent Labs Lab 01/20/13 1622 01/20/13 1839 01/21/13 0420  NA 139  --  133*  K 4.7  --  4.3  CL 100  --  99  CO2 28  --  23  BUN 33* 32* 30*  CREATININE 1.14 1.12 0.95  CALCIUM 9.4  --  8.6  PROT 7.3  --   --  BILITOT 0.7  --   --   ALKPHOS 77  --   --   ALT 20  --   --   AST 23  --   --   GLUCOSE 131*  --  145*  2D Trans-thoracic ECHO - Left ventricle: The cavity size was at the upper limits of normal. The estimated ejection fraction was in the range of 15% to 25%. Diffuse hypokinesis. Probable severe hypokinesis to akinesis of the anteroseptal and apical myocardium. Doppler parameters are consistent with both elevated ventricular end-diastolic  filling pressure and elevated left atrial filling pressure. Increased echogenity at apex; cannot exclude thrombus. Consider definity contrast study. - Aortic valve: Mildly calcified annulus. Trileaflet; moderately calcified leaflets. Valve mobility was restricted. Consider TEE to better define. Mild regurgitation. - Mitral valve: Calcified annulus. Mild regurgitation. - Left atrium: The atrium was severely dilated. - Right ventricle: The cavity size was mildly dilated. - Right atrium: The atrium was mildly dilated. - Atrial septum: No defect or patent foramen ovale was identified. - Tricuspid valve: Mild regurgitation.  CT ABD/PELVIS FINDINGS:  The lung bases appear clear. No pleural or pericardial effusion.  No suspicious liver abnormality. The gallbladder is normal. No  biliary dilatation. The pancreas appears normal. There is  significantly diminished enhancement of greater than 90% of the  splenic parenchyma consistent with splenic infarct. There is  significant calcified atherosclerotic change involving the splenic  artery which may be occluded, image 29/series 2. The adrenal glands  both appear normal. There are bilateral renal cysts identified. The  urinary bladder appears normal. The prostate gland and seminal  vesicles are unremarkable.  Advanced calcified atherosclerotic disease affects the abdominal  aorta and its branches. No aneurysm. There is no upper abdominal  adenopathy. There is no pelvic or inguinal adenopathy identified.  Small hiatal hernia noted. The stomach and the small bowel loops  have a normal course and caliber without obstruction. Normal  appearance of the proximal colon. There is multiple distal colonic  diverticula identified.  Review of the visualized osseous structures is significant for mild  multilevel degenerative disc disease. No aggressive lytic or  sclerotic bone lesions identified.  IMPRESSION:  1. Examination is positive for splenic  infarction. At least 90% of  the splenic parenchyma appears involved. There is significant  calcified atherosclerotic disease involving the splenic artery which  may be occluded.  2. Atherosclerotic disease.  3. Hiatal hernia   Vance Gather, MD 01/23/2013, 2:58 AM PGY-1, [place] Intern pager: [phone] Amion password: [email or web address], text pages welcome

## 2013-01-24 ENCOUNTER — Encounter (HOSPITAL_COMMUNITY)
Admission: AD | Disposition: A | Discharge status: Home / Self Care | Payer: Self-pay | Source: Ambulatory Visit | Attending: Family Medicine

## 2013-01-24 ENCOUNTER — Encounter (HOSPITAL_COMMUNITY): Payer: Self-pay | Admitting: Cardiovascular Disease

## 2013-01-24 DIAGNOSIS — I2510 Atherosclerotic heart disease of native coronary artery without angina pectoris: Secondary | ICD-10-CM

## 2013-01-24 DIAGNOSIS — I513 Intracardiac thrombosis, not elsewhere classified: Secondary | ICD-10-CM | POA: Diagnosis present

## 2013-01-24 DIAGNOSIS — I5189 Other ill-defined heart diseases: Secondary | ICD-10-CM

## 2013-01-24 DIAGNOSIS — I739 Peripheral vascular disease, unspecified: Secondary | ICD-10-CM | POA: Diagnosis present

## 2013-01-24 HISTORY — PX: LEFT HEART CATHETERIZATION WITH CORONARY ANGIOGRAM: SHX5451

## 2013-01-24 LAB — CBC
HCT: 33.7 % — ABNORMAL LOW (ref 39.0–52.0)
Hemoglobin: 11.4 g/dL — ABNORMAL LOW (ref 13.0–17.0)
MCH: 31.6 pg (ref 26.0–34.0)
MCHC: 33.8 g/dL (ref 30.0–36.0)
MCV: 93.4 fL (ref 78.0–100.0)
Platelets: 189 10*3/uL (ref 150–400)
RDW: 13.8 % (ref 11.5–15.5)
WBC: 12.1 10*3/uL — ABNORMAL HIGH (ref 4.0–10.5)

## 2013-01-24 LAB — BASIC METABOLIC PANEL
BUN: 21 mg/dL (ref 6–23)
Calcium: 7.9 mg/dL — ABNORMAL LOW (ref 8.4–10.5)
Chloride: 99 mEq/L (ref 96–112)
GFR calc Af Amer: 71 mL/min — ABNORMAL LOW (ref >90)
GFR calc non Af Amer: 62 mL/min — ABNORMAL LOW (ref >90)
Potassium: 3.8 mEq/L (ref 3.5–5.1)
Sodium: 132 mEq/L — ABNORMAL LOW (ref 135–145)

## 2013-01-24 LAB — HEPARIN LEVEL (UNFRACTIONATED): Heparin Unfractionated: >2.2 IU/mL — ABNORMAL HIGH (ref 0.30–0.70)

## 2013-01-24 SURGERY — LEFT HEART CATHETERIZATION WITH CORONARY ANGIOGRAM
Anesthesia: LOCAL

## 2013-01-24 MED ORDER — SODIUM CHLORIDE 0.45 % IV SOLN: INTRAVENOUS | Status: DC

## 2013-01-24 MED ORDER — DIAZEPAM 5 MG PO TABS
5.0000 mg | ORAL_TABLET | ORAL | Status: AC
Start: 2013-01-24 — End: 2013-01-24
  Administered 2013-01-24: 5 mg via ORAL
  Filled 2013-01-24: qty 1

## 2013-01-24 MED ORDER — LIDOCAINE HCL (PF) 1 % IJ SOLN
INTRAMUSCULAR | Status: AC
  Filled 2013-01-24: qty 30

## 2013-01-24 MED ORDER — ACETAMINOPHEN 325 MG PO TABS: 650.0000 mg | ORAL_TABLET | ORAL | Status: DC | PRN

## 2013-01-24 MED ORDER — NITROGLYCERIN 0.2 MG/ML ON CALL CATH LAB
INTRAVENOUS | Status: AC
  Filled 2013-01-24: qty 1

## 2013-01-24 MED ORDER — ASPIRIN 81 MG PO CHEW
81.0000 mg | CHEWABLE_TABLET | Freq: Every day | ORAL | Status: DC
  Administered 2013-01-25: 10:00:00 81 mg via ORAL
  Filled 2013-01-24: qty 1

## 2013-01-24 MED ORDER — ONDANSETRON HCL 4 MG/2ML IJ SOLN: 4.0000 mg | Freq: Four times a day (QID) | INTRAMUSCULAR | Status: DC | PRN

## 2013-01-24 MED ORDER — HEPARIN (PORCINE) IN NACL 100-0.45 UNIT/ML-% IJ SOLN
1500.0000 [IU]/h | INTRAMUSCULAR | Status: DC
  Administered 2013-01-24: 1500 [IU]/h via INTRAVENOUS
  Filled 2013-01-24 (×3): qty 250

## 2013-01-24 MED ORDER — SODIUM CHLORIDE 0.9 % IV SOLN
INTRAVENOUS | Status: AC
  Administered 2013-01-24: 16:00:00 via INTRAVENOUS

## 2013-01-24 MED ORDER — MORPHINE SULFATE 2 MG/ML IJ SOLN: 2.0000 mg | INTRAMUSCULAR | Status: DC | PRN

## 2013-01-24 MED ORDER — CHLORHEXIDINE GLUCONATE 4 % EX LIQD
Freq: Once | CUTANEOUS | Status: DC
  Filled 2013-01-24: qty 15

## 2013-01-24 MED ORDER — HEPARIN (PORCINE) IN NACL 2-0.9 UNIT/ML-% IJ SOLN
INTRAMUSCULAR | Status: AC
  Filled 2013-01-24: qty 1000

## 2013-01-24 NOTE — CV Procedure (Signed)
Gregory [name] is a 79 y.o. male    [id number] LOCATION:  FACILITY: [place]  PHYSICIAN: Quay Burow, M.D. 31-Mar-1932   DATE OF PROCEDURE:  01/24/2013  DATE OF DISCHARGE:     CARDIAC CATHETERIZATION     History obtained from chart review.79yoM with history of AFib and CAD s/p CABG (1993), s/p CVA, who presented to his primary care physician after sudden onset LUQ abdominal pain that was excruciating, also with N/V at that time. Sent to [facility] for CT abd/pelvis which identified splenic infarct. Patient is interviewed with daughter Chong Sicilian) and wife at bedside. Patient, daughter and wife report that patient had been on warfarin in years past for secondary stroke prevention but he did not tolerate it ("dizzy"). Patient has been maintained on Plavix and aspirin.  CABG in 2003 and last cath 2008 with patent LIMA-LAD, patent VG-LCX, patent VG-PDA/PLA, but EF 20-25% He has declined ICD, though last echo EF 40-45%. Known pul HTN. Also relative bradycardia. + bil carotid disease. Mod severe rt carotid dz. And mod severe Lt carotid disease. A transesophageal echo revealed extensive thrombus in the left atrial appendage. His ejection fraction by transthoracic echo was in the 15-20% range which represented a significant drop compared to prior echoes. Because of this it was elected to proceed with right and left heart catheter findings anatomy and the status of his grafts    PROCEDURE DESCRIPTION:   The patient was brought to the second floor  Cardiac cath lab in the postabsorptive state. He was premedicated with Valium 5 mg by mouth. His right groinwas prepped and shaved in usual sterile fashion. Xylocaine 1% was used for local anesthesia. A 5 French sheath was inserted into the right common femoral artery using standard Seldinger technique. A 7 French sheath was inserted into the right common femoral vein. A 7 French balloon tipped hemodilution Swan-Ganz catheter was advanced to the  right heart chambers obtaining sequential right heart  Pressures.the 5 French right and left Judkins diagnostic catheters along with a 5 French pigtail catheter and LIMA catheter were used for selective coronary jogger she comes with a vein graft and IMA angiography. Left ventriculography was not performed although the pigtail catheter to cross the aortic valve to obtain left ventricular end diastolic pressure. [place] she is to the entirety of the case (80 cc delivered the patient and). Retrograde aortic, left ventricular end pullback pressures were recorded.   HEMODYNAMICS:    AO SYSTOLIC/AO DIASTOLIC: [id number]   LV SYSTOLIC/LV DIASTOLIC: [id number]  Right atrial pressure: Mean 12  Right ventricular pressure: Systolic 46 diastolic 7 and diastolic of 11  Pulmonary artery pressure: 41/19, mean 29  Primary A. Wedge pressure: V-wave 29, mean 20    ANGIOGRAPHIC RESULTS:   1. Left main; normal  2. LAD; occluded after the first small diagonal branch 3. Left circumflex; occluded after the first small marginal branch.  4. Right coronary artery; occluded proximally 5.LIMA TO LAD; widely patent with retrograde fill backwards of a diagonal branch 6. SVG TO circumflex obtuse marginal branch was widely patent with at most 50-60% mid shaft versus     SVG TO PDA and PLA sequential was widely patent 7. Left ventriculography;was not performed  IMPRESSION:Mr. 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 He does have extensive thrombus in his left atrial appendage by transesophageal echo. He will need a life vest placed in anticipation of potential transition to permanent ICD. The  sheaths were removed and pressure was held on the groin to achieve hemostasis. The patient left the Cath Lab in stable condition. He'll be heparinized and 6 hours and consideration given to be getting a novel oral  anticoagulant.  [name] MD, [facility] 01/24/2013 2:53 PM

## 2013-01-24 NOTE — H&P (View-Only) (Signed)
Subjective: No chest pain, no SOB - wife relates in lounge chair at home he also has periods of apnea.      Objective: Vital signs in last 24 hours: Temp:  [97.7 F (36.5 C)-99.2 F (37.3 C)] 97.7 F (36.5 C) (11/10 0553) Pulse Rate:  [40-83] 70 (11/10 0553) Resp:  [12-20] 15 (11/10 0553) BP: (96-110)/(47-56) 110/56 mmHg (11/10 0553) SpO2:  [83 %-100 %] 99 % (11/10 0553) Weight:  [182 lb 15.7 oz (83 kg)] 182 lb 15.7 oz (83 kg) (11/10 0500) Weight change:  Last BM Date: 01/22/13 Intake/Output from previous day: +1738  Wt is up to 182 from 178 since admit, this is first day he is + I&O 11/09 0701 - 11/10 0700 In: 2138.3 [P.O.:100; I.V.:1738.3; IV Piggyback:300] Out: 400 [Urine:400] Intake/Output this shift:    PE: General:Pleasant affect, NAD, color mildly flushed overall looks better than Sat and feels better. Skin:Warm and dry, brisk capillary refill HEENT:normocephalic, sclera clear, mucus membranes moist Neck:supple, no JVD  Heart  irreg irreg with soft systolic murmur, no gallup, rub or click Lungs:clear, ant without rales, rhonchi, or wheezes [id number], mildly tender lt upper quad, + BS Ext:no lower ext edema, 2+ pedal pulses, 2+ radial pulses Neuro:alert and oriented, MAE, follows commands, + facial symmetry   Lab Results:  Recent Labs  01/22/13 0550 01/23/13 0543  WBC 17.7* 15.5*  HGB 13.4 11.5*  HCT 38.7* 33.3*  PLT 206 172   BMET  Recent Labs  01/21/13 0420 01/23/13 0543  NA 133* 133*  K 4.3 3.4*  CL 99 100  CO2 23 24  GLUCOSE 145* 96  BUN 30* 24*  CREATININE 0.95 1.15  CALCIUM 8.6 7.4*   No results found for this basename: TROPONINI, CK, MB,  in the last 72 hours  Lab Results  Component Value Date   CHOL 172 11/30/2012   HDL 52 11/30/2012   LDLCALC 106* 11/30/2012   TRIG 72 11/30/2012   CHOLHDL 3.3 11/30/2012   Lab Results  Component Value Date   HGBA1C  Value: 6.0 (NOTE) The ADA recommends the following therapeutic goal for  glycemic control related to Hgb A1c measurement: Goal of therapy: <6.5 Hgb A1c  Reference: American Diabetes Association: Clinical Practice Recommendations 2010, Diabetes Care, 2010, 33: (Suppl  1). 05/09/2009     Lab Results  Component Value Date   TSH 2.785 11/30/2012    Hepatic Function Panel  Recent Labs  01/20/13 1622  PROT 7.3  ALBUMIN 4.0  AST 23  ALT 20  ALKPHOS 77  BILITOT 0.7   Studies/Results: ECHO:   - Left ventricle: The cavity size was at the upper limits of normal. The estimated ejection fraction was in the range of 15% to 25%. Diffuse hypokinesis. Probable severe hypokinesis to akinesis of the anteroseptal and apical myocardium. Doppler parameters are consistent with both elevated ventricular end-diastolic filling pressure and elevated left atrial filling pressure. Increased echogenity at apex; cannot exclude thrombus. Consider definity contrast study. - Aortic valve: Mildly calcified annulus. Trileaflet; moderately calcified leaflets. Valve mobility was restricted. Consider TEE to better define. Mild regurgitation. - Mitral valve: Calcified annulus. Mild regurgitation. - Left atrium: The atrium was severely dilated. - Right ventricle: The cavity size was mildly dilated. - Right atrium: The atrium was mildly dilated. - Atrial septum: No defect or patent foramen ovale was identified. - Tricuspid valve: Mild regurgitation   Medications: I have reviewed the patient's current medications. Scheduled Meds: . aspirin  EC  81 mg Oral Daily  . atorvastatin  40 mg Oral q1800  . carvedilol  9.375 mg Oral BID WC  . lisinopril  10 mg Oral Daily  . piperacillin-tazobactam (ZOSYN)  IV  3.375 g Intravenous Q8H  . potassium chloride  10 mEq Intravenous Once  . vancomycin  1,000 mg Intravenous Q12H   Continuous Infusions: . heparin 1,300 Units/hr (01/23/13 0207)   PRN Meds:.acetaminophen, haemophilus B conjugate vaccine, meningococcal vaccine, morphine injection,  ondansetron (ZOFRAN) IV, ondansetron, pneumococcal 13-valent conjugate vaccine  Assessment/Plan: Principal Problem:   Splenic infarct Active Problems:   CAD (coronary artery disease),CABG 1993-LIMA to the LAD, SVG to Om and SVG to PDA/PLA, patent on cath 2008.   Chronic a-fib   Cardiomyopathy, ischemic, improved to 40-45% 06/2012 by echo, now 15-25% 01/21/13   History of tobacco use, continues with chewing tobacco   Occlusion and stenosis of carotid artery without mention of cerebral infarction  PLAN:  For TEE today,  eval for thrombus  Possible cath this admit to eval CAD considering drop in EF Carotid disease and pt has preferred medical therapy to surgery at this point. Blood cultures pending - on Zosyn During the night HR down to 40s, sleep apnea per RNs note. With desat.   Continues on IV heparin   LOS: 3 days   Time spent with pt. :15 minutes. [facility] R  Nurse Practitioner Certified Pager [phone] 01/23/2013, 11:06 AM   I have seen and examined the patient along with [facility] R  Nurse Practitioner Certified.  I have reviewed the chart, notes and new data.  I agree with NP's note.  Key new complaints: none Key examination changes: irregular rhythm, AF on monitor Key new findings / data: possible sleep apnea  PLAN: TEE with cautious sedation today. He has had some swallowing difficulties (distal esophageal stricture leading to food impaction) and will probably not get transgastric views. This procedure has been fully reviewed with the patient and written informed consent has been obtained.   Sanda Klein, MD, [place] [phone] 01/23/2013, 2:42 PM

## 2013-01-24 NOTE — Progress Notes (Signed)
Family Medicine Teaching Service Daily Progress Note Intern Pager: [phone]  Patient name: Gregory [name] Medical record number: [id number] Date of birth: [birth year] Age: 79 y.o. Gender: male  Primary Care Provider: Kennon Portela, MD Consultants: Cardiology Code Status: Full code  Pt Overview and Major Events to Date:  01/20/13: CT abdomen showing 90% of splenic infarct 01/21/13: Heparin started for anticoagulation  01/22/13: Leukocytosis, fever, blood cultures drawn, vanc/zosyn started 01/23/13: TEE shows thrombus occupying L atrial appendage 01/24/13: Cardiac catheterization  Assessment and Plan: Gregory [name] is a 79 y.o. 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 PMH is significant for CHF, chronic atrial fibrillation, ischemic cardiomyopathy, CAD and CABG in 1993.   # Splenic infarct: Left atrial thrombus embolization - Cardiac catheterization scheduled for today.  - TTE: EF 15-25% with hypokinesis/akinesis suggestive of occlusion of LAD bypass graft. Could not exclude cardiac thrombus.  - TEE: Left atrial appendage entirely occupied by thrombus, apical wall motion abnormality. No PFO/ASD.  - Lifelong anti-coagulation. Has history of warfarin intolerance. Would appreciate cardiology input on oral anticoagulation on discharge.  - Now on ASA and heparin gtt.  - Haemophilus, meningococcus and streptococcus immunizations ordered PTD. May order antibiotics for home prn.   # Leukocytosis:  - Last fever 100.8F 11/08 - WBC 17.7 > 15.5 > 12.1 - Blood cultures NGTD. CXR without infiltrate, U/A normal - Discontinuing vancomycin, zosyn (11/9-11/11), as bacteremia/andocarditis unlikely  # Systolic CHF and ischemic cardiomyopathy, chronic AFib, and h/o CAD: CABGx4 in 1993, patent grafts on cath in 2008. Echo from 06/23/2012 showing EF: 40-45%; now with EF 15-25%. Pt declines ICD. Will need a cardiac catheterization to assess  graft patency.  - Coreg 9.375mg  po BID rate control, lisinopril 10mg  - Up 1.7L from admission - On lipitor 40mg   # h/o CVA: BP control  - plavix and ASA at home; Per cardiology, discontinue plavix, continue aspirin.   # Hyponatremia Stable 133 > 132  # Hypokalemia 3.4 > 3.8 - Improved with IV repletion  # Carotid artery stenosis, bilateral, moderate-severe.  - Dr. Oneida Alar recommended CEA and patient opted for medical management only  FEN/GI: MIVF: KVO, NPO Prophylaxis: heparin gtt    Disposition: Continue to monitor closely for continued embolization/sepsis, probable cath.   Subjective: Pt reports controlled pain in LUQ only on deep inspiration, feels well. No CP/SOB. He is hungry. He is not particularly anxious about cath today.    Objective: Temp:  [97.7 F (36.5 C)-98.8 F (37.1 C)] 98.2 F (36.8 C) (11/11 0519) Pulse Rate:  [64-86] 73 (11/11 0519) Resp:  [13-25] 18 (11/11 0519) BP: (90-120)/(46-57) 102/46 mmHg (11/11 0519) SpO2:  [94 %-100 %] 94 % (11/11 0519) Physical Exam: General: Elderly man sitting in bedside chair in no acute distress Cardiovascular: S1S2, rate is regular, no murmurs Respiratory: No increased WOB, CTAB, on O2 by [place] Abdomen: mild tenderness in left upper quadrant, no rebound, no guarding.  Extremities: no edema/calf tenderness  Laboratory:  Recent Labs Lab 01/22/13 0550 01/23/13 0543 01/24/13 0450  WBC 17.7* 15.5* 12.1*  HGB 13.4 11.5* 11.4*  HCT 38.7* 33.3* 33.7*  PLT 206 172 189    Recent Labs Lab 01/20/13 1622  01/23/13 0543 01/23/13 1530 01/24/13 0450  NA 139  < > 133* 132* 132*  K 4.7  < > 3.4* 3.9 3.8  CL 100  < > 100 97 99  CO2 28  < > 24 25 23   BUN 33*  < >  24* 21 21  CREATININE 1.14  < > 1.15 1.13 1.11  CALCIUM 9.4  < > 7.4* 7.6* 7.9*  PROT 7.3  --   --   --   --   BILITOT 0.7  --   --   --   --   ALKPHOS 77  --   --   --   --   ALT 20  --   --   --   --   AST 23  --   --   --   --   GLUCOSE 131*  < > 96 194* 97   < > = values in this interval not displayed.  2D Trans-thoracic ECHO - Left ventricle: The cavity size was at the upper limits of normal. The estimated ejection fraction was in the range of 15% to 25%. Diffuse hypokinesis. Probable severe hypokinesis to akinesis of the anteroseptal and apical myocardium. Doppler parameters are consistent with both elevated ventricular end-diastolic filling pressure and elevated left atrial filling pressure. Increased echogenity at apex; cannot exclude thrombus. Consider definity contrast study. - Aortic valve: Mildly calcified annulus. Trileaflet; moderately calcified leaflets. Valve mobility was restricted. Consider TEE to better define. Mild regurgitation. - Mitral valve: Calcified annulus. Mild regurgitation. - Left atrium: The atrium was severely dilated. - Right ventricle: The cavity size was mildly dilated. - Right atrium: The atrium was mildly dilated. - Atrial septum: No defect or patent foramen ovale was identified. - Tricuspid valve: Mild regurgitation.  TRANS-ESOPHAGEAL EHO - Left ventricle: Hypertrophy was noted. - Aortic valve: No evidence of vegetation. Mild regurgitation. - Mitral valve: Mildly calcified annulus. No evidence of vegetation. Mild regurgitation directed centrally. - Left atrium: The atrium was moderately to severely dilated. There was a large, solid, fixed thrombus occupying the entire appendage. There was moderatecontinuous spontaneous echo contrast ("smoke") in the cavity. - Right atrium: No evidence of thrombus in the atrial cavity or appendage. - Atrial septum: No defect or patent foramen ovale was identified. Echo contrast study showed no right-to-left atrial level shunt. Echo contrast study showed no right-to-left atrial level shunt, at baseline or with provocation. - Tricuspid valve: No evidence of vegetation. - Pulmonic valve: No evidence of vegetation.   CT ABD/PELVIS FINDINGS:  The lung bases  appear clear. No pleural or pericardial effusion.  No suspicious liver abnormality. The gallbladder is normal. No  biliary dilatation. The pancreas appears normal. There is  significantly diminished enhancement of greater than 90% of the  splenic parenchyma consistent with splenic infarct. There is  significant calcified atherosclerotic change involving the splenic  artery which may be occluded, image 29/series 2. The adrenal glands  both appear normal. There are bilateral renal cysts identified. The  urinary bladder appears normal. The prostate gland and seminal  vesicles are unremarkable.  Advanced calcified atherosclerotic disease affects the abdominal  aorta and its branches. No aneurysm. There is no upper abdominal  adenopathy. There is no pelvic or inguinal adenopathy identified.  Small hiatal hernia noted. The stomach and the small bowel loops  have a normal course and caliber without obstruction. Normal  appearance of the proximal colon. There is multiple distal colonic  diverticula identified.  Review of the visualized osseous structures is significant for mild  multilevel degenerative disc disease. No aggressive lytic or  sclerotic bone lesions identified.  IMPRESSION:  1. Examination is positive for splenic infarction. At least 90% of  the splenic parenchyma appears involved. There is significant  calcified atherosclerotic disease involving the splenic  artery which  may be occluded.  2. Atherosclerotic disease.  3. Hiatal hernia  Vance Gather, MD 01/24/2013, 9:19 AM PGY-1, [place] Intern pager: [phone] Amion password: [email or web address], text pages welcome

## 2013-01-24 NOTE — Progress Notes (Signed)
ANTICOAGULATION CONSULT NOTE - Follow Up Consult  Pharmacy Consult for heparin Indication: splenic infarct, LV thrombus, will need lifelong AC  Labs:  Recent Labs  01/22/13 0550 01/23/13 0543 01/23/13 1530 01/24/13 0450 01/24/13 1245  HGB 13.4 11.5*  --  11.4*  --   HCT 38.7* 33.3*  --  33.7*  --   PLT 206 172  --  189  --   HEPARINUNFRC 0.56 0.47  --  0.21* >2.20*  CREATININE  --  1.15 1.13 1.11  --      Assessment: 79yo male to restart heparin for LV thrombus, splenic infarct s/p cath  Goal of Therapy:  Heparin level 0.3-0.7 units/ml   Plan:  1) Heparin drip at 1500 units / hr starting at 10 pm 2) Follow up AM heparin level, CBC  Thank you. Anette Guarneri, PharmD [phone]   01/24/2013,3:46 PM

## 2013-01-24 NOTE — Progress Notes (Signed)
Attending Addendum  I examined the patient and discussed the assessment and plan with Dr. Bonner Puna. I have reviewed the note and agree. Patient and family amenable to starting xarelto prior to discharge for thromboembolic event prophylaxis. going for cath today.     Boykin Nearing, [place]

## 2013-01-24 NOTE — Progress Notes (Signed)
ANTICOAGULATION CONSULT NOTE - Follow Up Consult  Pharmacy Consult for heparin Indication: splenic infarct  Labs:  Recent Labs  01/21/13 1130  01/22/13 0550 01/23/13 0543 01/23/13 1530 01/24/13 0450  HGB  --   < > 13.4 11.5*  --  11.4*  HCT  --   --  38.7* 33.3*  --  33.7*  PLT  --   --  206 172  --  189  APTT 37  --   --   --   --   --   LABPROT 13.8  --   --   --   --   --   INR 1.08  --   --   --   --   --   HEPARINUNFRC  --   < > 0.56 0.47  --  0.21*  CREATININE  --   --   --  1.15 1.13 1.11  < > = values in this interval not displayed.   Assessment: 79yo male now subtherapeutic on heparin after several levels at goal though had been trending down.  Goal of Therapy:  Heparin level 0.3-0.7 units/ml   Plan:  Will increase heparin gtt by 2-3 units/kg/hr to 1500 units/hr and check level in 6hr.  Wynona Neat, PharmD, BCPS  01/24/2013,7:02 AM

## 2013-01-24 NOTE — Interval H&P Note (Signed)
Cath Lab Visit (complete for each Cath Lab visit)  Clinical Evaluation Leading to the Procedure:   ACS: no  Non-ACS:    Anginal Classification: No Symptoms  Anti-ischemic medical therapy: No Therapy  Non-Invasive Test Results: No non-invasive testing performed  Prior CABG: Previous CABG      History and Physical Interval Note:  01/24/2013 2:14 PM  Gregory [name]  has presented today for surgery, with the diagnosis of cp  The various methods of treatment have been discussed with the patient and family. After consideration of risks, benefits and other options for treatment, the patient has consented to  Procedure(s): LEFT HEART CATHETERIZATION WITH CORONARY ANGIOGRAM (N/A) as a surgical intervention .  The patient's history has been reviewed, patient examined, no change in status, stable for surgery.  I have reviewed the patient's chart and labs.  Questions were answered to the patient's satisfaction.     [name]

## 2013-01-24 NOTE — Progress Notes (Signed)
Subjective:  No complaints  Objective:  Vital Signs in the last 24 hours: Temp:  [97.7 F (36.5 C)-98.8 F (37.1 C)] 98.2 F (36.8 C) (11/11 0519) Pulse Rate:  [64-86] 73 (11/11 0519) Resp:  [13-25] 18 (11/11 0519) BP: (90-120)/(46-57) 102/46 mmHg (11/11 0519) SpO2:  [94 %-100 %] 94 % (11/11 0519)  Intake/Output from previous day:  Intake/Output Summary (Last 24 hours) at 01/24/13 1006 Last data filed at 01/24/13 0630  Gross per 24 hour  Intake    886 ml  Output    500 ml  Net    386 ml    Physical Exam: General appearance: alert, cooperative and no distress Lungs: clear to auscultation bilaterally Heart: irregularly irregular rhythm   Rate: 74  Rhythm: atrial fibrillation  Lab Results:  Recent Labs  01/23/13 0543 01/24/13 0450  WBC 15.5* 12.1*  HGB 11.5* 11.4*  PLT 172 189    Recent Labs  01/23/13 1530 01/24/13 0450  NA 132* 132*  K 3.9 3.8  CL 97 99  CO2 25 23  GLUCOSE 194* 97  BUN 21 21  CREATININE 1.13 1.11   No results found for this basename: TROPONINI, CK, MB,  in the last 72 hours  Recent Labs  01/21/13 1130  INR 1.08    Imaging: Imaging results have been reviewed  Cardiac Studies: TEE 01/23/13-  Assessment/Plan:   Principal Problem:   Splenic infarct Active Problems:   Cardiomyopathy, ischemic, improved to 40-45% 06/2012 by echo, now 15-25% 01/21/13   CAD (coronary artery disease),CABG 1993-LIMA to the LAD, SVG to Om and SVG to PDA/PLA, patent on cath 2008.   Chronic a-fib   PVD - 123456 LICA, [id number] RICA by doppler 5/14   Thrombus of left atrial appendage on TEE 01/23/13   CVA (cerebral vascular accident)   History of tobacco use, continues with chewing tobacco    PLAN: Discussed with Dr Debara Pickett, proceed with coronary/graft angiogram today for new LVD.   Kerin Ransom PA-C Beeper [id number] 01/24/2013, 10:06 AM   Agree with note written by Kerin Ransom PAC  New decrease in LV Fxn ? Etiology. H/O CABG for R/L heart  cath  [facility] J 01/24/2013 3:08 PM

## 2013-01-25 LAB — BASIC METABOLIC PANEL
BUN: 17 mg/dL (ref 6–23)
Calcium: 8.2 mg/dL — ABNORMAL LOW (ref 8.4–10.5)
Chloride: 102 mEq/L (ref 96–112)
GFR calc Af Amer: 78 mL/min — ABNORMAL LOW (ref >90)
GFR calc non Af Amer: 67 mL/min — ABNORMAL LOW (ref >90)
Potassium: 3.9 mEq/L (ref 3.5–5.1)
Sodium: 136 mEq/L (ref 135–145)

## 2013-01-25 LAB — CBC
HCT: 31.3 % — ABNORMAL LOW (ref 39.0–52.0)
MCH: 31 pg (ref 26.0–34.0)
MCHC: 33.2 g/dL (ref 30.0–36.0)
Platelets: 201 10*3/uL (ref 150–400)
RDW: 13.9 % (ref 11.5–15.5)
WBC: 9.7 10*3/uL (ref 4.0–10.5)

## 2013-01-25 LAB — HEPARIN LEVEL (UNFRACTIONATED)
Heparin Unfractionated: 0.45 IU/mL (ref 0.30–0.70)
Heparin Unfractionated: 0.54 IU/mL (ref 0.30–0.70)

## 2013-01-25 LAB — POCT ACTIVATED CLOTTING TIME: Activated Clotting Time: 176 seconds

## 2013-01-25 MED ORDER — RIVAROXABAN 20 MG PO TABS
20.0000 mg | ORAL_TABLET | Freq: Every day | ORAL | Status: DC
  Filled 2013-01-25: qty 1

## 2013-01-25 MED ORDER — HEPARIN (PORCINE) IN NACL 100-0.45 UNIT/ML-% IJ SOLN
1500.0000 [IU]/h | INTRAMUSCULAR | Status: AC
  Filled 2013-01-25: qty 250

## 2013-01-25 MED ORDER — CARVEDILOL 3.125 MG PO TABS: 9.3750 mg | ORAL_TABLET | Freq: Two times a day (BID) | ORAL | Status: DC

## 2013-01-25 MED ORDER — PNEUMOCOCCAL VAC POLYVALENT 25 MCG/0.5ML IJ INJ
0.5000 mL | INJECTION | INTRAMUSCULAR | Status: AC
  Administered 2013-01-25: 0.5 mL via INTRAMUSCULAR
  Filled 2013-01-25: qty 0.5

## 2013-01-25 MED ORDER — RIVAROXABAN 20 MG PO TABS: 20.0000 mg | ORAL_TABLET | Freq: Every day | ORAL | Status: DC

## 2013-01-25 MED ORDER — RIVAROXABAN 20 MG PO TABS
20.0000 mg | ORAL_TABLET | Freq: Every day | ORAL | Status: DC
  Administered 2013-01-25: 20 mg via ORAL
  Filled 2013-01-25: qty 1

## 2013-01-25 MED ORDER — INFLUENZA VAC SPLIT QUAD 0.5 ML IM SUSP
0.5000 mL | INTRAMUSCULAR | Status: AC
  Administered 2013-01-25: 17:00:00 0.5 mL via INTRAMUSCULAR
  Filled 2013-01-25: qty 0.5

## 2013-01-25 NOTE — Progress Notes (Signed)
ANTICOAGULATION CONSULT NOTE - Follow Up Consult  Pharmacy Consult for heparin Indication: splenic infarct / afib  Allergies  Allergen Reactions  . Percocet [Oxycodone-Acetaminophen] Itching and Nausea Only    Patient Measurements: Height: 5\' 6"  (167.6 cm) Weight: 186 lb 4.6 oz (84.5 kg) IBW/kg (Calculated) : 63.8 Heparin Dosing Weight:   Vital Signs: Temp: 97.7 F (36.5 C) (11/12 1445) Temp src: Oral (11/12 1445) BP: 127/48 mmHg (11/12 1445) Pulse Rate: 72 (11/12 1445)  Labs:  Recent Labs  01/23/13 0543 01/23/13 1530 01/24/13 0450 01/24/13 1245 01/25/13 0530 01/25/13 1400  HGB 11.5*  --  11.4*  --  10.4*  --   HCT 33.3*  --  33.7*  --  31.3*  --   PLT 172  --  189  --  201  --   HEPARINUNFRC 0.47  --  0.21* >2.20* 0.45 0.54  CREATININE 1.15 1.13 1.11  --  1.03  --     Estimated Creatinine Clearance: 60.3 ml/min (by C-G formula based on Cr of 1.03).   Medications:  Scheduled:  . aspirin  81 mg Oral Daily  . atorvastatin  40 mg Oral q1800  . carvedilol  9.375 mg Oral BID WC  . [START ON 01/26/2013] influenza vac split quadrivalent PF  0.5 mL Intramuscular Tomorrow-1000  . lisinopril  10 mg Oral Daily  . [START ON 01/26/2013] pneumococcal 23 valent vaccine  0.5 mL Intramuscular Tomorrow-1000  . rivaroxaban  20 mg Oral QAC supper   Infusions:  . heparin      Assessment: 79 yo male with splenic infarct and afib is currently on therapeutic heparin.  Heparin level is 0.54. Goal of Therapy:  Heparin level 0.3-0.7 units/ml Monitor platelets by anticoagulation protocol: Yes   Plan:  1. Cont Heparin drip at 1500 units / hr until 1700 today.  2. Start xarelto 20mg  po qdws tonight.   Abayomi Pattison, [place] 01/25/2013,3:13 PM

## 2013-01-25 NOTE — Care Management Note (Signed)
    Page 1 of 1   01/25/2013     2:02:51 PM   CARE MANAGEMENT NOTE 01/25/2013  Patient:  MITSUO, EGLOFF   Account Number:  [id number]  Date Initiated:  01/25/2013  Documentation initiated by:  Fuller Mandril  Subjective/Objective Assessment:   79 yo male with LUQ pain, splenic infarct, afib//home with spouse     Action/Plan:   cardiac cath//home with spouse; benefits check   Anticipated DC Date:  01/25/2013   Anticipated DC Plan:  [place]  CM consult      Choice offered to / List presented to:  NA           Status of service:   Medicare Important Message given?   (If response is "NO", the following Medicare IM given date fields will be blank) Date Medicare IM given:   Date Additional Medicare IM given:    Discharge Disposition:    Per UR Regulation:    If discussed at Long Length of Stay Meetings, dates discussed:    Comments:  01/25/13 [place], RN, BSN, [employer] [phone] Spoke with pt and spouse at bedside regarding benefits check for Xarelto 20mg .  Xarelto free 30 day card and $5.00 refill card given to pt for use upon discharge.  Pt utilizes [facility] on [address].  NCM called pharmacy to verify medication in stock.  Pt verbalizes importance of filling prescription and taking medications promptly upon discharge.

## 2013-01-25 NOTE — Progress Notes (Signed)
Family Medicine Teaching Service Daily Progress Note Intern Pager: [phone]  Patient name: ARAN COWENS Medical record number: [id number] Date of birth: [birth year] Age: 79 y.o. Gender: male  Primary Care Provider: Kennon Portela, MD Consultants: Cardiology, Social work Code Status: Full code  Pt Overview and Major Events to Date:  01/20/13: CT abdomen showing 90% of splenic infarct 01/21/13: Heparin started for anticoagulation  01/22/13: Leukocytosis, fever, blood cultures drawn, vanc/zosyn started 01/23/13: TEE shows thrombus occupying L atrial appendage 01/24/13: Cardiac catheterization  Assessment and Plan: DHEERAJ CHARNLEY is a 79 y.o. 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 PMH is significant for CHF, chronic atrial fibrillation, ischemic cardiomyopathy, CAD and CABG in 1993.   # Splenic infarct: Left atrial thrombus embolization - Pain controlled without medications  - Haemophilus, meningococcus and streptococcus immunizations ordered PTD.   # Systolic CHF and ischemic cardiomyopathy, chronic AFib, and h/o CAD: CABGx4 in 1993, patent grafts on cath in 2008. Echo from 06/23/2012 showing EF: 40-45%; now with EF 15-25%. Pt declines ICD. Will need a cardiac catheterization to assess graft patency.  - Coreg 9.375mg  po BID rate control, lisinopril 10mg  - On lipitor 40mg  - TTE: EF 15-25% with hypokinesis/akinesis suggestive of occlusion of LAD bypass graft. Could not exclude cardiac thrombus.  - TEE: Left atrial appendage entirely occupied by thrombus, apical wall motion abnormality. No PFO/ASD.  - Lifelong anti-coagulation. Has history of warfarin intolerance. Would appreciate cardiology input on oral anticoagulation on discharge.  - Cath: patent grafts, severe LV dysfunction due likely to ischemic/nonischemic CM. LAA thrombus. - To D/C heparin gtt and start xarelto - Life vest/ICD recommended by cariology and by primary  team. Pt is aware of risks and benefits and declines both measures. Will take oral anticoagulant.   - Anemia Acute drop after cath yesterday (11.4 > 10.4) w/o s/sxs of bleeding.  - Recommend recheck at PCP followup.   # Leukocytosis: Resolved - Last fever 100.13F 11/08 - WBC 17.7 >> 9.7 - Blood cultures NGTD. CXR without infiltrate, U/A normal - s/p 3 days vancomycin, zosyn  # h/o CVA: BP control  - plavix and ASA at home; Per cardiology, discontinue plavix, continue aspirin.   # Hyponatremia Resolved  # Hypokalemia Resolved s/p repletion  # Carotid artery stenosis, bilateral, moderate-severe.  - Dr. Oneida Alar recommended CEA and patient opted for medical management only  FEN/GI: MIVF: KVO, Heart healthy diet Prophylaxis: heparin gtt    Disposition: To D/C home of xarelto with PCP and cardiology follow up.   Subjective: Pt feels well. No CP/SOB. Does not want life vest. Ready to go home.   Objective: Temp:  [97.6 F (36.4 C)-99 F (37.2 C)] 97.6 F (36.4 C) (11/12 0636) Pulse Rate:  [28-76] 71 (11/12 0636) Resp:  [17-20] 20 (11/12 0636) BP: (111-129)/(36-64) 129/51 mmHg (11/12 0636) SpO2:  [89 %-99 %] 98 % (11/12 0636) Weight:  [185 lb 10 oz (84.2 kg)-186 lb 4.6 oz (84.5 kg)] 186 lb 4.6 oz (84.5 kg) (11/12 0219) Physical Exam: General: Elderly man sitting in chair in good spirits Cardiovascular: S1S2, rate is regular, no murmurs Respiratory: No increased WOB, CTAB, on O2 by [place] Abdomen: Soft, NT, ND Extremities: no edema/calf tenderness, trace pitting edema Skin: R groin wound dressing c/d/i  Laboratory:  Recent Labs Lab 01/23/13 0543 01/24/13 0450 01/25/13 0530  WBC 15.5* 12.1* 9.7  HGB 11.5* 11.4* 10.4*  HCT 33.3* 33.7* 31.3*  PLT 172 189 201  Recent Labs Lab 01/20/13 1622  01/23/13 1530 01/24/13 0450 01/25/13 0530  NA 139  < > 132* 132* 136  K 4.7  < > 3.9 3.8 3.9  CL 100  < > 97 99 102  CO2 28  < > 25 23 24   BUN 33*  < > 21 21 17   CREATININE 1.14   < > 1.13 1.11 1.03  CALCIUM 9.4  < > 7.6* 7.9* 8.2*  PROT 7.3  --   --   --   --   BILITOT 0.7  --   --   --   --   ALKPHOS 77  --   --   --   --   ALT 20  --   --   --   --   AST 23  --   --   --   --   GLUCOSE 131*  < > 194* 97 108*  < > = values in this interval not displayed.  2D Trans-thoracic ECHO - Left ventricle: The cavity size was at the upper limits of normal. The estimated ejection fraction was in the range of 15% to 25%. Diffuse hypokinesis. Probable severe hypokinesis to akinesis of the anteroseptal and apical myocardium. Doppler parameters are consistent with both elevated ventricular end-diastolic filling pressure and elevated left atrial filling pressure. Increased echogenity at apex; cannot exclude thrombus. Consider definity contrast study. - Aortic valve: Mildly calcified annulus. Trileaflet; moderately calcified leaflets. Valve mobility was restricted. Consider TEE to better define. Mild regurgitation. - Mitral valve: Calcified annulus. Mild regurgitation. - Left atrium: The atrium was severely dilated. - Right ventricle: The cavity size was mildly dilated. - Right atrium: The atrium was mildly dilated. - Atrial septum: No defect or patent foramen ovale was identified. - Tricuspid valve: Mild regurgitation.  TRANS-ESOPHAGEAL EHO - Left ventricle: Hypertrophy was noted. - Aortic valve: No evidence of vegetation. Mild regurgitation. - Mitral valve: Mildly calcified annulus. No evidence of vegetation. Mild regurgitation directed centrally. - Left atrium: The atrium was moderately to severely dilated. There was a large, solid, fixed thrombus occupying the entire appendage. There was moderatecontinuous spontaneous echo contrast ("smoke") in the cavity. - Right atrium: No evidence of thrombus in the atrial cavity or appendage. - Atrial septum: No defect or patent foramen ovale was identified. Echo contrast study showed no right-to-left atrial level shunt.  Echo contrast study showed no right-to-left atrial level shunt, at baseline or with provocation. - Tricuspid valve: No evidence of vegetation. - Pulmonic valve: No evidence of vegetation.   CT ABD/PELVIS FINDINGS:  The lung bases appear clear. No pleural or pericardial effusion.  No suspicious liver abnormality. The gallbladder is normal. No  biliary dilatation. The pancreas appears normal. There is  significantly diminished enhancement of greater than 90% of the  splenic parenchyma consistent with splenic infarct. There is  significant calcified atherosclerotic change involving the splenic  artery which may be occluded, image 29/series 2. The adrenal glands  both appear normal. There are bilateral renal cysts identified. The  urinary bladder appears normal. The prostate gland and seminal  vesicles are unremarkable.  Advanced calcified atherosclerotic disease affects the abdominal  aorta and its branches. No aneurysm. There is no upper abdominal  adenopathy. There is no pelvic or inguinal adenopathy identified.  Small hiatal hernia noted. The stomach and the small bowel loops  have a normal course and caliber without obstruction. Normal  appearance of the proximal colon. There is multiple distal colonic  diverticula identified.  Review of the visualized osseous structures is significant for mild  multilevel degenerative disc disease. No aggressive lytic or  sclerotic bone lesions identified.  IMPRESSION:  1. Examination is positive for splenic infarction. At least 90% of  the splenic parenchyma appears involved. There is significant  calcified atherosclerotic disease involving the splenic artery which  may be occluded.  2. Atherosclerotic disease.  3. Hiatal hernia  ANGIOGRAPHIC RESULTS:  1. Left main; normal  2. LAD; occluded after the first small diagonal branch  3. Left circumflex; occluded after the first small marginal branch.  4. Right coronary artery; occluded  proximally  5.LIMA TO LAD; widely patent with retrograde fill backwards of a diagonal branch  6. SVG TO circumflex obtuse marginal branch was widely patent with at most 50-60% mid shaft versus  SVG TO PDA and PLA sequential was widely patent  7. Left ventriculography;was not performed  IMPRESSION:Mr. Kittell has patent grafts with elevated filling pressures and severe LV dysfunction probably representing a mixed ischemic/nonischemic myopathy. He does have extensive thrombus in his left atrial appendage by transesophageal echo. He will need a life vest placed in anticipation of potential transition to permanent ICD. The sheaths were removed and pressure was held on the groin to achieve hemostasis. The patient left the Cath Lab in stable condition. He'll be heparinized and 6 hours and consideration given to be getting a novel oral anticoagulant.   Vance Gather, MD 01/25/2013, 8:32 AM PGY-1, [place] Intern pager: [phone] Amion password: [email or web address], text pages welcome

## 2013-01-25 NOTE — Progress Notes (Signed)
ANTICOAGULATION CONSULT NOTE - Initial Consult  Pharmacy Consult for xarelto Indication: afib with apical appendix thrombus with splenic infarct  Allergies  Allergen Reactions  . Percocet [Oxycodone-Acetaminophen] Itching and Nausea Only    Patient Measurements: Height: 5\' 6"  (167.6 cm) Weight: 186 lb 4.6 oz (84.5 kg) IBW/kg (Calculated) : 63.8 Heparin Dosing Weight:   Vital Signs: Temp: 97.6 F (36.4 C) (11/12 0848) Temp src: Oral (11/12 0848) BP: 135/55 mmHg (11/12 0848) Pulse Rate: 81 (11/12 0848)  Labs:  Recent Labs  01/23/13 0543 01/23/13 1530 01/24/13 0450 01/24/13 1245 01/25/13 0530  HGB 11.5*  --  11.4*  --  10.4*  HCT 33.3*  --  33.7*  --  31.3*  PLT 172  --  189  --  201  HEPARINUNFRC 0.47  --  0.21* >2.20* 0.45  CREATININE 1.15 1.13 1.11  --  1.03    Estimated Creatinine Clearance: 60.3 ml/min (by C-G formula based on Cr of 1.03).   Medical History: Past Medical History  Diagnosis Date  . Coronary artery disease   . CAD (coronary artery disease),CABG 1993-LIMA to the LAD, SVG to Om and SVG to PDA/PLA, patent on cath 2008. 2003  . CHF (congestive heart failure)   . Heart murmur   . Atrial fibrillation     permanent  . Hypertension   . High cholesterol   . Heart attack 1980's  . Pneumonia     "in the last 5 yrs" (01/22/2013)  . GERD (gastroesophageal reflux disease)   . Stroke     "slightly drags left foot since; recovered qthing else" (01/21/2013)  . Kidney stones     "passed all but one time when he had to have lithotripsy" (01/21/2013)  . Cardiomyopathy 2008    EF 20% but now improved to 40-45%  . 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 aspirin  81 mg Oral Daily  . atorvastatin  40 mg Oral q1800  . carvedilol  9.375 mg Oral BID WC  . lisinopril  10 mg Oral Daily  . rivaroxaban  20 mg Oral QAC supper   Infusions:  . heparin      Assessment: 79 yo male with afib and apical appendix thrombus with splenic infarct  will be transitioned from heparin to xarelto.  CrCl ~60 Goal of Therapy:   Monitor platelets by anticoagulation protocol: Yes   Plan:  1) Continue heparin at 1500 units/hr until 1700 today.  Heparin level at 1400 to reassess dosing but infusion will still need to be stopped at 1700 2) Xarelto 20mg  po daily with evening meals 3) Monitor CBC and renal function  Janella Rogala, [place] 01/25/2013,9:57 AM

## 2013-01-25 NOTE — Progress Notes (Signed)
Attending Addendum  I examined the patient and discussed the assessment and plan with Dr. Bonner Puna. I have reviewed the note and agree.  Patient with no complaints today. Refusing life vest and ICD. Plan to d/c this PM after d/cing heparin and first dose of xarelto.     Gregory [name], [place]

## 2013-01-25 NOTE — Progress Notes (Signed)
ANTICOAGULATION CONSULT NOTE - Follow Up Consult  Pharmacy Consult for Heparin  Indication: splenic infarct, LV thrombus  Allergies  Allergen Reactions  . Percocet [Oxycodone-Acetaminophen] Itching and Nausea Only    Patient Measurements: Height: 5\' 6"  (167.6 cm) Weight: 186 lb 4.6 oz (84.5 kg) IBW/kg (Calculated) : 63.8  Vital Signs: Temp: 97.6 F (36.4 C) (11/12 0636) Temp src: Oral (11/12 0636) BP: 129/51 mmHg (11/12 0636) Pulse Rate: 71 (11/12 0636)  Labs:  Recent Labs  01/23/13 0543 01/23/13 1530 01/24/13 0450 01/24/13 1245 01/25/13 0530  HGB 11.5*  --  11.4*  --  10.4*  HCT 33.3*  --  33.7*  --  31.3*  PLT 172  --  189  --  201  HEPARINUNFRC 0.47  --  0.21* >2.20* 0.45  CREATININE 1.15 1.13 1.11  --  1.03    Estimated Creatinine Clearance: 60.3 ml/min (by C-G formula based on Cr of 1.03).   Medications:  Heparin 1500 units/hr  Assessment: 79 y/o s/p cath with splenic infarct, extensive thrombus in left atrial appendage. Heparin was restarted s/p cath and the first HL is 0.45. Other labs as above.   Goal of Therapy:  Heparin level 0.3-0.7 units/ml Monitor platelets by anticoagulation protocol: Yes  Plan:  -Continue heparin drip at 1500 units/hr -8 hour HL at 1400 to confirm -Daily CBC/HL -Monitor for bleeding -F/U oral anticoagulation   Thank you for allowing me to take part in this patient's care,  Narda Bonds, PharmD Clinical Pharmacist Phone: [phone] Pager: [phone] 01/25/2013 6:53 AM

## 2013-01-25 NOTE — Progress Notes (Signed)
Subjective: No complaints  Objective: Vital signs in last 24 hours: Temp:  [97.6 F (36.4 C)-99 F (37.2 C)] 97.6 F (36.4 C) (11/12 0636) Pulse Rate:  [28-76] 71 (11/12 0636) Resp:  [17-20] 20 (11/12 0636) BP: (111-129)/(36-64) 129/51 mmHg (11/12 0636) SpO2:  [89 %-99 %] 98 % (11/12 0636) Weight:  [185 lb 10 oz (84.2 kg)-186 lb 4.6 oz (84.5 kg)] 186 lb 4.6 oz (84.5 kg) (11/12 0219) Weight change:  Last BM Date: 01/24/13 Intake/Output from previous day: +772 11/11 0701 - 11/12 0700 In: 772.5 [I.V.:772.5] Out: -  Intake/Output this shift:    PE: General:Pleasant affect, NAD Skin:Warm and dry, brisk capillary refill HEENT:normocephalic, sclera clear, mucus membranes moist Heart:irreg irreg with soft systolic murmur, no gallup, rub or click Lungs:clear without rales, rhonchi, or wheezes [id number], non tender, + BS, do not palpate liver spleen or masses Ext:no lower ext edema, 2+ pedal pulses, 2+ radial pulses Neuro:alert and oriented, MAE, follows commands, + facial symmetry   Lab Results:  Recent Labs  01/24/13 0450 01/25/13 0530  WBC 12.1* 9.7  HGB 11.4* 10.4*  HCT 33.7* 31.3*  PLT 189 201   BMET  Recent Labs  01/24/13 0450 01/25/13 0530  NA 132* 136  K 3.8 3.9  CL 99 102  CO2 23 24  GLUCOSE 97 108*  BUN 21 17  CREATININE 1.11 1.03  CALCIUM 7.9* 8.2*   No results found for this basename: TROPONINI, CK, MB,  in the last 72 hours  Lab Results  Component Value Date   CHOL 172 11/30/2012   HDL 52 11/30/2012   LDLCALC 106* 11/30/2012   TRIG 72 11/30/2012   CHOLHDL 3.3 11/30/2012   Lab Results  Component Value Date   HGBA1C  Value: 6.0 (NOTE) The ADA recommends the following therapeutic goal for glycemic control related to Hgb A1c measurement: Goal of therapy: <6.5 Hgb A1c  Reference: American Diabetes Association: Clinical Practice Recommendations 2010, Diabetes Care, 2010, 33: (Suppl  1). 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 He does have extensive thrombus in his left atrial appendage by transesophageal echo.     Medications: I have reviewed the patient's current medications. Scheduled Meds: . aspirin  81 mg Oral Daily  . atorvastatin  40 mg Oral q1800  . carvedilol  9.375 mg Oral BID WC  . lisinopril  10 mg Oral Daily   Continuous Infusions: . heparin 1,500 Units/hr (01/25/13 0658)   PRN Meds:.acetaminophen, meningococcal vaccine, morphine injection, ondansetron (ZOFRAN) IV, ondansetron, pneumococcal 13-valent conjugate vaccine  Assessment/Plan: Principal Problem:   Splenic infarct Active Problems:   CAD (coronary artery disease),CABG 1993-LIMA to the LAD, SVG to Om and SVG to PDA/PLA, patent on cath 2008.   Chronic a-fib   Cardiomyopathy, ischemic, improved to 40-45% 06/2012 by echo, now 15-25% 01/21/13   CVA (cerebral vascular accident)   History of tobacco use, continues with chewing tobacco   PVD - 123456 LICA, [id number] RICA by doppler 5/14   Thrombus of left atrial appendage on TEE 01/23/13  PLAN:Dr. Gwenlyn Found discussed anticoagulation with Xarelto.  Pt is willing to try.  We also discussed lifevest/ICD but pt does not wish to have either of these devices.   Will begin Xarelto and ask care manager to eval for cost to the pt.  Otherwise pt is much improved from admit.   From our standpoint continue BB, ACE, and statin.  Lasix is on hold, we could hold until we see in the office.  Once anticoagulated with oral agent he could be d/c'd. He will follow up with Dr. Claiborne Billings.  LOS: 5 days   Time spent with pt. :20 minutes. 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 Exam benign. Groin OK. Pt does not want LifeVest or ICD. Discussed this as well as NOAC with Cecilie Kicks RNP present in room. Pt agreeable to NOAC. Will try Xarelto. If fiscally acceptable, will start today, D/C iv hep and D/C home. ROV with Dr. Dolly Rias J 01/25/2013 9:44 AM

## 2013-01-27 ENCOUNTER — Telehealth: Payer: Self-pay | Admitting: Cardiovascular Disease

## 2013-01-27 MED ORDER — PANTOPRAZOLE SODIUM 40 MG PO TBEC: 40.0000 mg | DELAYED_RELEASE_TABLET | Freq: Every day | ORAL | Status: DC

## 2013-01-27 NOTE — Telephone Encounter (Signed)
Their pharmacy is stating that they need prescription to refill Pantoprazole 40mg  ([facility]) corner of [address].[id number]. This is a former [place] Patient and theinks he may be seeing Dr.Kelly   Please Call if you have any questions .. Dr. Rollene Fare prescribe the medication .Marland Kitchen    Thanks

## 2013-01-27 NOTE — Telephone Encounter (Signed)
Returned call and pt verified x 2.  Pt informed refill sent to pharmacy.  Advised he have future refill requests faxed to office.  Pt verbalized understanding and agreed w/ plan.

## 2013-01-28 LAB — CULTURE, BLOOD (ROUTINE X 2): Culture: NO GROWTH

## 2013-01-31 NOTE — Discharge Summary (Signed)
[facility] Discharge Summary  Patient name: Gregory [name] Medical record number: [id number] Date of birth: [birth year] Age: 79 y.o. Gender: male Date of Admission: 01/20/2013  Date of Discharge: 01/25/2013 Admitting Physician: Willeen Niece, MD  Primary Care Provider: Kennon Portela, MD Consultants: Cardiology, Social Work  Indication for Hospitalization: Splenic infarct  Discharge Diagnoses/Problem List:  Patient Active Problem List   Diagnosis Date Noted  . PVD - 123456 LICA, [id number] RICA by doppler 5/14 01/24/2013  . 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 Appendicitis with abscess 03/31/2011  . CAD (coronary artery disease),CABG 1993-LIMA to the LAD, SVG to Om and SVG to PDA/PLA, patent on cath 2014. 03/31/2011  . Chronic a-fib 03/31/2011  . Cardiomyopathy, ischemic, improved to 40-45% 06/2012 by echo, now 15-25% 01/21/13 03/31/2011  . CVA (cerebral vascular accident) 03/31/2011  . GERD (gastroesophageal reflux disease) 03/31/2011  . Renal calculi 03/31/2011  . History of tobacco use, continues with chewing tobacco 03/31/2011   Disposition: Discharged home  Discharge Condition: Stable  Discharge Exam:  General: Elderly man sitting in chair in good spirits  Cardiovascular: S1S2, rate is regular, no murmurs  Respiratory: No increased WOB, CTAB, on O2 by [place]  Abdomen: Soft, NT, ND  Extremities: no edema/calf tenderness, trace pitting edema  Skin: R groin wound dressing c/d/i  Brief Hospital Course:  Gregory [name] is a 79 y.o. male presenting on 11/7 with left upper quadrant abdominal pain, found to have splenic infarct on CT abdomen. PMH is significant for CHF, chronic atrial fibrillation, ischemic cardiomyopathy, CAD and CABG in 1993.   The splenic infarct was found to be due to left atrial thrombus embolization. A 2D echocardiogram showed an ejection fraction of 15-25%, dramatically reduced  from the most recent study in April, 40-45%. A trans-esophageal echocardiogram showed a left atrial appendage entirely occupied by thrombus and apical wall motion abnormality. No PFO/ASD was seen. A cardiac catheterization demonstrated patent grafts and severe left ventricular dysfunction. 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 He had previously had intolerance of warfarin, so xarelto was started on the day of discharge after discontinuing heparin.   He had received 3 days of vancomycin and zosyn therapy when the infarct was accompanied by leukocytosis and fever, thought to be due to septic emboli. Blood cultures remained without growth for 5 days.   Vaccinations against haemophilus, meningococcus and streptococcus were administered prior to discharge.   Issues for Follow Up:  - Discussion of goals of care. Pt is full code with EF of 15-20% and left atrial thrombus. Declines ICD/life vest.  - Follow up signs of bleeding on newly prescribed xarelto. - Monitor for anemia, Hgb on discharge is 10.4  Significant Procedures: Cardiac Catheterization without intervention (note below)  Significant Labs and Imaging:  No results found for this basename: WBC, HGB, HCT, PLT,  in the last 168 hours No results found for this basename: NA, K, CL, CO2, GLUCOSE, BUN, CREATININE, CALCIUM, MG, PHOS, ALKPHOS, AST, ALT, ALBUMIN, PROTEIN, TBILI,  in the last 168 hours 2D Trans-thoracic ECHO - Left ventricle: The cavity size was at the upper limits of normal. The estimated ejection fraction was in the range of 15% to 25%. Diffuse hypokinesis. Probable severe hypokinesis to akinesis of the anteroseptal and apical myocardium. Doppler parameters are consistent with both elevated ventricular end-diastolic filling pressure and elevated left atrial filling pressure. Increased  echogenity at apex; cannot exclude thrombus. Consider definity contrast study. -  Aortic valve: Mildly calcified annulus. Trileaflet; moderately calcified leaflets. Valve mobility was restricted. Consider TEE to better define. Mild regurgitation. - Mitral valve: Calcified annulus. Mild regurgitation. - Left atrium: The atrium was severely dilated. - Right ventricle: The cavity size was mildly dilated. - Right atrium: The atrium was mildly dilated. - Atrial septum: No defect or patent foramen ovale was identified. - Tricuspid valve: Mild regurgitation.  TRANS-ESOPHAGEAL ECHO - Left ventricle: Hypertrophy was noted. - Aortic valve: No evidence of vegetation. Mild regurgitation. - Mitral valve: Mildly calcified annulus. No evidence of vegetation. Mild regurgitation directed centrally. - Left atrium: The atrium was moderately to severely dilated. There was a large, solid, fixed thrombus occupying the entire appendage. There was moderatecontinuous spontaneous echo contrast ("smoke") in the cavity. - Right atrium: No evidence of thrombus in the atrial cavity or appendage. - Atrial septum: No defect or patent foramen ovale was identified. Echo contrast study showed no right-to-left atrial level shunt. Echo contrast study showed no right-to-left atrial level shunt, at baseline or with provocation. - Tricuspid valve: No evidence of vegetation. - Pulmonic valve: No evidence of vegetation.  CT ABD/PELVIS  FINDINGS:  The lung bases appear clear. No pleural or pericardial effusion.  No suspicious liver abnormality. The gallbladder is normal. No  biliary dilatation. The pancreas appears normal. There is  significantly diminished enhancement of greater than 90% of the  splenic parenchyma consistent with splenic infarct. There is  significant calcified atherosclerotic change involving the splenic  artery which may be occluded, image 29/series 2. The adrenal glands  both appear normal. There are bilateral renal cysts identified. The  urinary bladder appears normal. The  prostate gland and seminal  vesicles are unremarkable.  Advanced calcified atherosclerotic disease affects the abdominal  aorta and its branches. No aneurysm. There is no upper abdominal  adenopathy. There is no pelvic or inguinal adenopathy identified.  Small hiatal hernia noted. The stomach and the small bowel loops  have a normal course and caliber without obstruction. Normal  appearance of the proximal colon. There is multiple distal colonic  diverticula identified.  Review of the visualized osseous structures is significant for mild  multilevel degenerative disc disease. No aggressive lytic or  sclerotic bone lesions identified.  IMPRESSION:  1. Examination is positive for splenic infarction. At least 90% of  the splenic parenchyma appears involved. There is significant  calcified atherosclerotic disease involving the splenic artery which  may be occluded.  2. Atherosclerotic disease.  3. Hiatal hernia   ANGIOGRAPHIC RESULTS:  1. Left main; normal  2. LAD; occluded after the first small diagonal branch  3. Left circumflex; occluded after the first small marginal branch.  4. Right coronary artery; occluded proximally  5.LIMA TO LAD; widely patent with retrograde fill backwards of a diagonal branch  6. SVG TO circumflex obtuse marginal branch was widely patent with at most 50-60% mid shaft versus  SVG TO PDA and PLA sequential was widely patent  7. 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 He does have extensive thrombus in his left atrial appendage by transesophageal echo. He will need a life vest placed in anticipation of potential transition to permanent ICD. The sheaths were removed and pressure was held on the groin to achieve hemostasis. The patient left the Cath Lab in stable condition. He'll be heparinized and  6 hours  and consideration given to be getting a novel oral anticoagulant.  Results/Tests Pending at Time of Discharge: None  Discharge Medications:    Medication List    STOP taking these medications       clopidogrel 75 MG tablet  Commonly known as:  PLAVIX      TAKE these medications       aspirin EC 81 MG tablet  Take 81 mg by mouth daily.     carvedilol 3.125 MG tablet  Commonly known as:  COREG  Take 3 tablets (9.375 mg total) by mouth 2 (two) times daily with a meal.     furosemide 40 MG tablet  Commonly known as:  LASIX  Take 40 mg by mouth daily.     HYCET 7.5-325 mg/15 ml solution  Generic drug:  HYDROcodone-acetaminophen  Take 10 mLs by mouth 4 (four) times daily as needed for moderate pain.     lisinopril 10 MG tablet  Commonly known as:  PRINIVIL,ZESTRIL  Take 10 mg by mouth daily.     MULTIVITAMIN PO  Take 1 tablet by mouth daily.     potassium chloride SA 20 MEQ tablet  Commonly known as:  K-DUR,KLOR-CON  Take 20 mEq by mouth daily.     Rivaroxaban 20 MG Tabs tablet  Commonly known as:  XARELTO  Take 1 tablet (20 mg total) by mouth daily before supper.     simvastatin 80 MG tablet  Commonly known as:  ZOCOR  Take 80 mg by mouth at bedtime.        Discharge Instructions: Please refer to Patient Instructions section of EMR for full details.  Patient was counseled important signs and symptoms that should prompt return to medical care, changes in medications, dietary instructions, activity restrictions, and follow up appointments.   Follow-Up Appointments: Follow-up Information   Follow up with GUEST, Veneda Melter, MD.   Specialty:  Internal Medicine   Contact information:   [place] [id number] [phone]       Follow up with Troy Sine, MD. (Call the office if not scheduled for appointment after discharge)    Specialty:  Cardiology   Contact information:   [address] [place] [address] [phone]        Vance Gather, MD 02/01/2013, 6:28 PM PGY-1, [place]

## 2013-02-06 ENCOUNTER — Telehealth: Payer: Self-pay | Admitting: *Deleted

## 2013-02-06 NOTE — Telephone Encounter (Signed)
Returned call.  Left message that samples at front desk and MD's nurse will be notified of need for PA.  Call back today before 4pm if questions.  Pt is RW-->>TK patient.  Message forwarded to [place], [place] to complete PA.  Paper chart requested for [facility].

## 2013-02-06 NOTE — Telephone Encounter (Signed)
Chart put on Gregory [name] desk. ST

## 2013-02-06 NOTE — Telephone Encounter (Signed)
Pt was in the hospital and they changed his Plavix to Xarelto and the insurance company will not pay for it. The insurance company wants to know why it was changed in order for them to pay for it. Please call pt back.

## 2013-02-07 NOTE — Telephone Encounter (Signed)
Amber this will have to be researched or see if Dr. Claiborne Billings saw this patient in the hospital. He has never been seen in the office by Dr. Claiborne Billings. Not sure if he will even be seeing [place].

## 2013-02-07 NOTE — Telephone Encounter (Deleted)
I understand.  I requested the paper chart for Dr. Claiborne Billings to review.  Unfortunately for the patient, Dr. Rollene Fare retired and he will still need a PA or to be switched to another med.  I hope this helps.

## 2013-02-13 NOTE — Discharge Summary (Signed)
Attending Addendum  I examined the patient and discussed the discharge plan with Dr. Grunz. I have reviewed the note and agree.    Maliyah Willets, MD FAMILY MEDICINE TEACHING SERVICE   

## 2013-02-15 LAB — IFOBT (OCCULT BLOOD): IFOBT: POSITIVE

## 2013-02-16 ENCOUNTER — Ambulatory Visit (INDEPENDENT_AMBULATORY_CARE_PROVIDER_SITE_OTHER): Payer: Medicare Other | Admitting: Internal Medicine

## 2013-02-16 VITALS — BP 104/50 | HR 67 | Temp 98.4°F | Resp 18 | Ht 67.0 in | Wt 181.6 lb

## 2013-02-16 DIAGNOSIS — I25810 Atherosclerosis of coronary artery bypass graft(s) without angina pectoris: Secondary | ICD-10-CM

## 2013-02-16 DIAGNOSIS — D735 Infarction of spleen: Secondary | ICD-10-CM

## 2013-02-16 DIAGNOSIS — I639 Cerebral infarction, unspecified: Secondary | ICD-10-CM

## 2013-02-16 DIAGNOSIS — D649 Anemia, unspecified: Secondary | ICD-10-CM

## 2013-02-16 DIAGNOSIS — I6350 Cerebral infarction due to unspecified occlusion or stenosis of unspecified cerebral artery: Secondary | ICD-10-CM

## 2013-02-16 DIAGNOSIS — Z79899 Other long term (current) drug therapy: Secondary | ICD-10-CM

## 2013-02-16 LAB — POCT CBC
Granulocyte percent: 51.3 %G (ref 37–80)
Hemoglobin: 10.7 g/dL — AB (ref 14.1–18.1)
Lymph, poc: 1.8 (ref 0.6–3.4)
MCH, POC: 29.3 pg (ref 27–31.2)
MCHC: 30.1 g/dL — AB (ref 31.8–35.4)
MPV: 8.2 fL (ref 0–99.8)
POC Granulocyte: 2.5 (ref 2–6.9)
POC MID %: 11.8 %M (ref 0–12)
Platelet Count, POC: 267 10*3/uL (ref 142–424)
RDW, POC: 14 %

## 2013-02-16 LAB — BASIC METABOLIC PANEL
Calcium: 8.7 mg/dL (ref 8.4–10.5)
Creat: 1 mg/dL (ref 0.50–1.35)
Sodium: 140 mEq/L (ref 135–145)

## 2013-02-16 NOTE — Progress Notes (Signed)
   Subjective:    Patient ID: Gregory [name], male    DOB: 25-Jan-1933, 79 y.o.   MRN: [id number]  HPI He states he feels great. Complex hx and recent admission for splenic infarct. Epic reviewed in detail for 20 minutes. On Gregory [name] now   Review of Systems     Objective:   Physical Exam  Vitals reviewed. Constitutional: He is oriented to person, place, and time. He appears well-developed and well-nourished. No distress.  HENT:  Head: Normocephalic.  Eyes: EOM are normal. No scleral icterus.  Neck: Normal range of motion.  Cardiovascular: Normal rate, S1 normal and S2 normal.  A regularly irregular rhythm present. Frequent extrasystoles are present. Exam reveals no gallop.   No murmur heard. Pulmonary/Chest: Effort normal and breath sounds normal.  Abdominal: There is no tenderness.  Neurological: He is alert and oriented to person, place, and time.  Psychiatric: He has a normal mood and affect.     Results for orders placed in visit on 02/16/13  POCT CBC      Result Value Range   WBC 4.9  4.6 - 10.2 K/uL   Lymph, poc 1.8  0.6 - 3.4   POC LYMPH PERCENT 36.9  10 - 50 %L   MID (cbc) 0.6  0 - 0.9   POC MID % 11.8  0 - 12 %M   POC Granulocyte 2.5  2 - 6.9   Granulocyte percent 51.3  37 - 80 %G   RBC 3.65 (*) 4.69 - 6.13 M/uL   Hemoglobin 10.7 (*) 14.1 - 18.1 g/dL   HCT, POC 35.5 (*) 43.5 - 53.7 %   MCV 97.2 (*) 80 - 97 fL   MCH, POC 29.3  27 - 31.2 pg   MCHC 30.1 (*) 31.8 - 35.4 g/dL   RDW, POC 14.0     Platelet Count, POC 267  142 - 424 K/uL   MPV 8.2  0 - 99.8 fL        Assessment & Plan:  Anemia improving Splenic infarct resolving Atrial thrombus needs life long anticoagulation CAD is stable Establish primary care

## 2013-02-16 NOTE — Patient Instructions (Signed)
Anticoagulation, Generic Anticoagulants are medications used to prevent clots from developing in your veins. These medications are also known as blood thinners. If blood clots are untreated, they could travel to your lungs. This is called a pulmonary embolus. A blood clot in your lungs can be fatal.  Caregivers often use anticoagulants to prevent clots following surgery. Anticoagulants are also used along with aspirin when the heart is not getting enough blood. Another anticoagulant called warfarin is started 2 to 3 days after a rapid-acting injectable anticoagulant is started. The rapid-acting anticoagulants are usually continued until warfarin has begun to work. Your caregiver will judge this length of time by blood tests known as the prothrombin time (PT) and International Normalization Ratio (INR). This means that your blood is at the necessary and best level to prevent clots. RISKS AND COMPLICATIONS  If you have received recent epidural anesthesia, spinal anesthesia, or a spinal tap while receiving anticoagulants, you are at risk for developing a blood clot in or around the spine. This condition could result in long-term or permanent paralysis.  Because anticoagulants thin your blood, severe bleeding may occur from any tissue or organ. Symptoms of the blood being too thin may include:  Bleeding from the nose or gums that does not stop quickly.  Unusual bruising or bruising easily.  Swelling or pain at an injection site.  A cut that does not stop bleeding within 10 minutes.  Continual nausea for more than 1 day or vomiting blood.  Coughing up blood.  Blood in the urine which may appear as pink, red, or brown urine.  Blood in bowel movements which may appear as red, dark or black stools.  Sudden weakness or numbness of the face, arm, or leg, especially on one side of the body.  Sudden confusion.  Trouble speaking (aphasia) or understanding.  Sudden trouble seeing in one or both  eyes.  Sudden trouble walking.  Dizziness.  Loss of balance or coordination.  Severe pain, such as a headache, joint pain, or back pain.  Fever.  Too little anticoagulation continues to allow the risk for blood clots. HOME CARE INSTRUCTIONS   Due to the complications of anticoagulants, it is very important that you take your anticoagulant as directed by your caregiver. Anticoagulants need to be taken exactly as instructed. Be sure you understand all your anticoagulant instructions.  Warfarin. Your caregiver will advise you on the length of treatment (usually 3 6 months, sometimes lifelong).  Take warfarin exactly as directed by your caregiver. It is recommended that you take your warfarin dose at the same time of the day. It is preferred that you take warfarin in the late afternoon. If you have been told to stop taking warfarin, do not resume taking warfarin until directed to do so by your caregiver. Follow your caregiver's instructions if you accidentally take an extra dose or miss a dose of warfarin. It is very important to take warfarin as directed since bleeding or blood clots could result in chronic or permanent injury, pain, or disability.  Too much and too little warfarin are both dangerous. Too much warfarin increases the risk of bleeding. Too little warfarin continues to allow the risk for blood clots. While taking warfarin, you will need to have regular blood tests to measure your blood clotting time. These blood tests usually include both the PT and INR tests. The PT and INR results allow your caregiver to adjust your dose of warfarin. The dose can change for many reasons. It is critically   important that you take warfarin exactly as prescribed, and that you have your PT and INR levels drawn exactly as directed. Follow up with your laboratory test appointments as directed. It is very important to keep your lab appointments. Not keeping lab appointments could result in a chronic or  permanent injury, pain, or disability.  Many foods, especially foods high in vitamin K can interfere with warfarin and affect the PT and INR results. Foods high in vitamin K include spinach, kale, broccoli, cabbage, collard and turnip greens, brussels sprouts, peas, cauliflower, seaweed, and parsley as well as beef and pork liver, green tea, and soybean oil. You should eat a consistent amount of foods high in vitamin K. Avoid major changes in your diet, or notify your caregiver before changing your diet. Arrange a visit with a dietitian to answer your questions.  Many medicines can interfere with warfarin and affect the PT and INR results. You must tell your caregiver about any and all medicines you take, this includes all vitamins and supplements. Ask your caregiver before taking these. Prescription and over-the-counter medicine consistency is critical to warfarin management. It is important that potential interactions are checked before you start a new medicine. Be especially cautious with aspirin and anti-inflammatory medicines. Ask your caregiver before taking these. Medicines such as antibiotics and acid-reducing medicine can interact with warfarin and can cause an increased warfarin effect. Warfarin can also interfere with the effectiveness of medicines you are taking. Do not take or discontinue any prescribed or over-the-counter medicine except on the advice of your caregiver or pharmacist.  Some vitamins, supplements, and herbal products interfere with the effectiveness of warfarin. Vitamin E may increase the anticoagulant effects of warfarin. Vitamin K may can cause warfarin to be less effective. Do not take or discontinue any vitamin, supplement, or herbal product except on the advice of your caregiver or pharmacist.  Alcohol can change the body's ability to handle warfarin. It is best to avoid alcoholic drinks or consume only very small amounts while taking warfarin. Notify your caregiver if you  change your alcohol intake. A sudden increase in alcohol use can increase your risk of bleeding. Chronic alcohol use can cause warfarin to be less effective.  If you have a loss of appetite or get the stomach flu (viral gastroenteritis), talk to your caregiver as soon as possible. A decrease in your normal vitamin K intake can make you more sensitive to your usual dose of warfarin.  Some medical conditions may increase your risk for bleeding while you are taking warfarin. A fever, diarrhea lasting more than a day, worsening heart failure, or worsening liver function are some medical conditions that could affect warfarin. Contact your caregiver if you have any of these medical conditions.  Warfarin can have side effects, such as excessive bruising or bleeding. You will need to hold pressure over cuts for longer than usual.  Be careful not to cut yourself when using sharp objects.  Notify your dentist or other caregivers before procedures.  Limit physical activities or sports that could result in a fall or cause injury. Avoid contact sports.  Wear a medical alert bracelet or carry a medical alert card. SEEK MEDICAL CARE IF:   You develop any rashes.  You have any worsening of the condition for which you are receiving anticoagulation therapy. SEEK IMMEDIATE MEDICAL CARE IF:   Bleeding from the nose or gums does not stop quickly.  You have unusual bruising or are bruising easily.  Swelling or pain occurs   at an injection site.  A cut does not stop bleeding within 10 minutes.  You have continual nausea for more than 1 day or are vomiting blood.  You are coughing up blood.  You have blood in the urine.  You have dark or black stools.  You have sudden weakness or numbness of the face, arm, or leg, especially on one side of the body.  You have sudden confusion.  You have trouble speaking (aphasia) or understanding.  You have sudden trouble seeing in one or both eyes.  You have  sudden trouble walking.  You have dizziness.  You have a loss of balance or coordination.  You have severe pain, such as a headache, joint pain, or back pain.  You have a serious fall or head injury, even if you are not bleeding.  You have an oral temperature above 102 F (38.9 C), not controlled by medicine. ANY OF THESE SYMPTOMS MAY REPRESENT A SERIOUS PROBLEM THAT IS AN EMERGENCY. Do not wait to see if the symptoms will go away. Get medical help right away. Call your local emergency services (911 in [place] DO NOT drive yourself to the hospital. MAKE SURE YOU:   Understand these instructions.  Will watch your condition.  Will get help right away if you are not doing well or get worse. Document Released: 03/02/2005 Document Revised: 11/25/2011 Document Reviewed: 10/05/2007 [employer] Patient Information 2014 [employer].  

## 2013-02-20 ENCOUNTER — Encounter: Payer: Self-pay | Admitting: Radiology

## 2013-02-23 ENCOUNTER — Ambulatory Visit (INDEPENDENT_AMBULATORY_CARE_PROVIDER_SITE_OTHER): Payer: Medicare Other | Admitting: Cardiovascular Disease

## 2013-02-23 ENCOUNTER — Encounter: Payer: Self-pay | Admitting: Cardiovascular Disease

## 2013-02-23 VITALS — BP 110/70 | HR 80 | Ht 67.0 in | Wt 182.1 lb

## 2013-02-23 DIAGNOSIS — K219 Gastro-esophageal reflux disease without esophagitis: Secondary | ICD-10-CM

## 2013-02-23 DIAGNOSIS — D7389 Other diseases of spleen: Secondary | ICD-10-CM

## 2013-02-23 DIAGNOSIS — Z7901 Long term (current) use of anticoagulants: Secondary | ICD-10-CM

## 2013-02-23 DIAGNOSIS — I2589 Other forms of chronic ischemic heart disease: Secondary | ICD-10-CM

## 2013-02-23 DIAGNOSIS — I513 Intracardiac thrombosis, not elsewhere classified: Secondary | ICD-10-CM

## 2013-02-23 DIAGNOSIS — I2510 Atherosclerotic heart disease of native coronary artery without angina pectoris: Secondary | ICD-10-CM

## 2013-02-23 DIAGNOSIS — Z79899 Other long term (current) drug therapy: Secondary | ICD-10-CM

## 2013-02-23 DIAGNOSIS — I5189 Other ill-defined heart diseases: Secondary | ICD-10-CM

## 2013-02-23 DIAGNOSIS — I255 Ischemic cardiomyopathy: Secondary | ICD-10-CM

## 2013-02-23 DIAGNOSIS — R5381 Other malaise: Secondary | ICD-10-CM

## 2013-02-23 DIAGNOSIS — I739 Peripheral vascular disease, unspecified: Secondary | ICD-10-CM

## 2013-02-23 DIAGNOSIS — D735 Infarction of spleen: Secondary | ICD-10-CM

## 2013-02-23 DIAGNOSIS — I4891 Unspecified atrial fibrillation: Secondary | ICD-10-CM

## 2013-02-23 DIAGNOSIS — E785 Hyperlipidemia, unspecified: Secondary | ICD-10-CM

## 2013-02-23 DIAGNOSIS — I482 Chronic atrial fibrillation, unspecified: Secondary | ICD-10-CM

## 2013-02-23 NOTE — Patient Instructions (Signed)
Your physician recommends that you schedule a follow-up appointment in: 3-4 Months  Your physician recommends that you return for lab work in: 3 Months CBC, CMP, BMP, TSH, [place] has requested that you have an echocardiogram. Echocardiography is a painless test that uses sound waves to create images of your heart. It provides your doctor with information about the size and shape of your heart and how well your heart's chambers and valves are working. This procedure takes approximately one hour. There are no restrictions for this procedure. 3 MONTHS

## 2013-02-24 ENCOUNTER — Telehealth: Payer: Self-pay | Admitting: Cardiovascular Disease

## 2013-02-24 MED ORDER — RIVAROXABAN 20 MG PO TABS: 20.0000 mg | ORAL_TABLET | Freq: Every day | ORAL | Status: DC

## 2013-02-24 NOTE — Telephone Encounter (Signed)
Message forwarded to [place], [place] r/t PA for Xarelto.

## 2013-02-24 NOTE — Telephone Encounter (Signed)
Returned call and pt verified x 2 w/ pt's daughter, [name].  Stated pt was seen yesterday and told his Rx for Xarelto was taken care of and samples not needed.  Stated when they went to the pharmacy, they didn't have a Rx.  Daughter informed Rx will be sent to pharmacy and samples left at front desk as RN is not sure if PA completed for Xarelto.  Informed Mariann Laster, CMA will be notified.  Verbalized understanding and agreed w/ plan.  Will pick up samples today.

## 2013-02-24 NOTE — Telephone Encounter (Signed)
They will refill his Xarelto until the doctor call the insurance and tell them why he needs Xarelto. He is completely out of it. He need samples of Xarelto until this matter is taken of.

## 2013-03-20 ENCOUNTER — Other Ambulatory Visit: Payer: Self-pay | Admitting: *Deleted

## 2013-03-21 ENCOUNTER — Encounter: Payer: Self-pay | Admitting: Cardiovascular Disease

## 2013-03-21 DIAGNOSIS — Z7901 Long term (current) use of anticoagulants: Secondary | ICD-10-CM | POA: Insufficient documentation

## 2013-03-21 NOTE — Progress Notes (Signed)
Patient ID: Gregory [name], male   DOB: 07-18-1932, 80 y.o.   MRN: [id number]     HPI: Gregory [name] is a 80 y.o. male valuation. He is a 4 patient of Dr. Rollene Fare who was last seen by him in September 2014.  [name] has a history of known CAD in 2003 underwent CABG revascularization surgery by Dr. Servando Snare and had a LIMA placed to the LAD, vein graft to the obtuse marginal, sequential vein graft to the PDA and posterior lateral branch in the right eye artery. His last cardiac catheterization was in 2008 done by Dr. Tami Ribas. At that time, ejection fraction was 20-25%. He declined consideration for ICD. Subsequent ejection fraction did improve to approximately 35-40% in September 2012 on medical therapy. In the past, he has refused Coumadin therapy and had been maintained on aspirin and Plavix. In 2013 he was hospitalized for appendicitis with a. Cranial abscess requiring laparoscopic appendectomy. He apparently was hospitalized in November 2014 and was felt to have a splenic infarct of embolic etiology. He underwent a transesophageal echocardiogram on 01/23/2013 by Dr. Sallyanne Kuster which revealed an ejection fraction in the range of 15-25%. There was diffuse hypokinesis with severe hypokinesis to akinesis of the anteroseptal apical myocardium. He had increased left ventricular end-diastolic filling pressure and elevated left atrial filling pressure. There was evidence for large solid fixed thrombus occupying the entire atrial appendage with moderate continuous spontaneous echo contrast ("smoke") in the left atrial cavity. There was no evidence for right atrial thrombus. At that time, the patient refused life vest.  He has been on Xarelto for anticoagulation. He presents now for cardiology assessment.  He denies chest pain. He denies significant shortness of breath. He is unaware of tachdysrhythmias.  Past Medical History  Diagnosis Date  . Coronary artery disease   . CAD (coronary artery disease),CABG  1993-LIMA to the LAD, SVG to Om and SVG to PDA/PLA, patent on cath 2008. 2003  . CHF (congestive heart failure)   . Heart murmur   . Atrial fibrillation     permanent  . Hypertension   . High cholesterol   . Heart attack 1980's  . Pneumonia     "in the last 5 yrs" (01/22/2013)  . GERD (gastroesophageal reflux disease)   . Stroke     "slightly drags left foot since; recovered qthing else" (01/21/2013)  . Kidney stones     "passed all but one time when he had to have lithotripsy" (01/21/2013)  . Cardiomyopathy 2008    EF 20% but now improved to 40-45%  . 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 Laparoscopic appendectomy  03/30/2011    Procedure: APPENDECTOMY LAPAROSCOPIC;  Surgeon: Joyice Faster. Cornett, MD;  Location: [facility];  Service: General;  Laterality: N/A;  . Appendectomy    . Eye surgery    . Cataract extraction w/ intraocular lens implant Bilateral 2013  . Esophagogastroduodenoscopy  02/01/2012    Procedure: ESOPHAGOGASTRODUODENOSCOPY (EGD);  Surgeon: Beryle Beams, MD;  Location: [facility];  Service: Endoscopy;  Laterality: N/A;  . Coronary artery bypass graft  02/2002    "CABG X4" LIMA-LAD;VG-LCX; VG-PDA/PLA  . Coronary angioplasty with stent placement  2003; 2008    "1 + 1; total of 2"   . Lithotripsy      "once" (01/21/2013)  . Tee without cardioversion N/A 01/23/2013    Procedure: TRANSESOPHAGEAL ECHOCARDIOGRAM (TEE);  Surgeon: Sanda Klein, MD;  Location: [facility];  Service:  Cardiovascular;  Laterality: N/A;    Allergies  Allergen Reactions  . Percocet [Oxycodone-Acetaminophen] Itching and Nausea Only    Current Outpatient Prescriptions  Medication Sig Dispense Refill  . aspirin EC 81 MG tablet Take 81 mg by mouth daily.      . carvedilol (COREG) 3.125 MG tablet Take 3 tablets (9.375 mg total) by mouth 2 (two) times daily with a meal.  180 tablet  0  . furosemide (LASIX) 40 MG tablet Take 40 mg by mouth daily.        Marland Kitchen HYDROcodone-acetaminophen (HYCET) 7.5-325 mg/15 ml solution Take 10 mLs by mouth 4 (four) times daily as needed for moderate pain.      Marland Kitchen lisinopril (PRINIVIL,ZESTRIL) 10 MG tablet Take 10 mg by mouth daily.      . Multiple Vitamins-Minerals (MULTIVITAMIN PO) Take 1 tablet by mouth daily.      . pantoprazole (PROTONIX) 40 MG tablet Take 1 tablet (40 mg total) by mouth daily.  30 tablet  4  . potassium chloride SA (K-DUR,KLOR-CON) 20 MEQ tablet Take 20 mEq by mouth daily.      . simvastatin (ZOCOR) 80 MG tablet Take 80 mg by mouth at bedtime.      . Rivaroxaban (XARELTO) 20 MG TABS tablet Take 1 tablet (20 mg total) by mouth daily before supper.  30 tablet  5  . Rivaroxaban (XARELTO) 20 MG TABS tablet Take 1 tablet (20 mg total) by mouth daily with supper.  25 tablet  0   No current facility-administered medications for this visit.    History   Social History  . Marital Status: Married    Spouse Name: N/A    Number of Children: N/A  . Years of Education: N/A   Occupational History  . Not on file.   Social History Main Topics  . Smoking status: Former Smoker -- 2.00 packs/day for 20 years    Types: Cigarettes    Quit date: 04/08/1986  . Smokeless tobacco: Current User    Types: Chew  . Alcohol Use: No  . Drug Use: No  . Sexual Activity: Not on file   Other Topics Concern  . Not on file   Social History Narrative  . No narrative on file    Family History  Problem Relation Age of Onset  . Heart disease Father   . Heart attack Brother   . Heart disease Daughter     ROS is negative for fevers, chills or night sweats. He denies headache. He denies change in vision or hearing. He is unaware of lymphadenopathy. He denies wheezing. He denies PND orthopnea. There is no presyncope or syncope. He denies abdominal pain.  He does have GERD. He is unaware of blood in stool or urine. He denies significant edema. There is no claudication. He does have a history of renal calculi. He  does have bilateral moderate to severe carotid disease with his last Doppler study in April 2014. There is remote CVA.  He denies musculoskeletal symptoms. There are no seizures. Other comprehensive 14 point system review is negative.  PE BP 110/70  Pulse 80  Ht 5\' 7"  (1.702 m)  Wt 182 lb 1.6 oz (82.6 kg)  BMI 28.51 kg/m2  General: Alert, oriented, no distress.  Skin: normal turgor, no rashes HEENT: Normocephalic, atraumatic. Pupils round and reactive; sclera anicteric;no lid lag.  Nose without nasal septal hypertrophy Mouth/Parynx benign; Mallinpatti scale 3 Neck: No JVD, bilateral carotid bruits with slightly delayed upstroke. Lungs: clear to  ausculatation and percussion; no wheezing or rales Chest wall: no tenderness to palpitation Heart: Irregularly irregular rhythm compatible with atrial fibrillation with a controlled ventricular response, s1 s2 normal 2/6 systolic murmur. No S3 gallop. Abdomen: soft, nontender; no hepatosplenomehaly, BS+; abdominal aorta nontender and not dilated by palpation. Back: no CVA tenderness Pulses 2+ Extremities: no clubbing cyanosis or edema, Homan's sign negative  Neurologic: grossly nonfocal Psychologic: normal affect and mood.  ECG: Atrial fibrillation at 80 beats per minute with isolated PVC.  LABS:  BMET    Component Value Date/Time   NA 140 02/16/2013 1109   K 4.8 02/16/2013 1109   CL 104 02/16/2013 1109   CO2 28 02/16/2013 1109   GLUCOSE 84 02/16/2013 1109   BUN 19 02/16/2013 1109   CREATININE 1.00 02/16/2013 1109   CREATININE 1.03 01/25/2013 0530   CALCIUM 8.7 02/16/2013 1109   GFRNONAA 67* 01/25/2013 0530   GFRAA 78* 01/25/2013 0530     Hepatic Function Panel     Component Value Date/Time   PROT 7.3 01/20/2013 1622   ALBUMIN 4.0 01/20/2013 1622   AST 23 01/20/2013 1622   ALT 20 01/20/2013 1622   ALKPHOS 77 01/20/2013 1622   BILITOT 0.7 01/20/2013 1622   BILIDIR 0.2 05/10/2009 0506   IBILI 1.1* 05/10/2009 0506     CBC    Component  Value Date/Time   WBC 4.9 02/16/2013 1114   WBC 9.7 01/25/2013 0530   RBC 3.65* 02/16/2013 1114   RBC 3.36* 01/25/2013 0530   HGB 10.7* 02/16/2013 1114   HGB 10.4* 01/25/2013 0530   HCT 35.5* 02/16/2013 1114   HCT 31.3* 01/25/2013 0530   PLT 201 01/25/2013 0530   MCV 97.2* 02/16/2013 1114   MCV 93.2 01/25/2013 0530   MCH 29.3 02/16/2013 1114   MCH 31.0 01/25/2013 0530   MCHC 30.1* 02/16/2013 1114   MCHC 33.2 01/25/2013 0530   RDW 13.9 01/25/2013 0530   LYMPHSABS 2.0 11/30/2012 1049   MONOABS 0.8 11/30/2012 1049   EOSABS 0.2 11/30/2012 1049   BASOSABS 0.0 11/30/2012 1049     BNP    Component Value Date/Time   PROBNP 2061.0* 04/03/2011 0458    Lipid Panel     Component Value Date/Time   CHOL 172 11/30/2012 1049   TRIG 72 11/30/2012 1049   HDL 52 11/30/2012 1049   CHOLHDL 3.3 11/30/2012 1049   VLDL 14 11/30/2012 1049   LDLCALC 106* 11/30/2012 1049     RADIOLOGY: No results found.    ASSESSMENT AND PLAN: Mr. Colan Hyppolite is a 80 year old gentleman who is over 11 years status post CABG revascularization surgery. A history of an ischemic heart myopathy and ejection fraction had been in the 25% with subsequent improvement to approximately 35-40%. He recently developed a splenic infarct most likely due to his emboli arising from his atrial fibrillation. He has documented extensive thrombus in his left atrium. He now is on anticoagulation therapy which needs to be lifelong. Presently he is tolerating ACE inhibition with lisinopril 10 mg and beta blocker therapy, currently taking carvedilol 9.375 mg twice a day. In the past he has refused an ICD and most recently has refused lifetest. I am recommending laboratory be rechecked in approximately 3 months at which time we will also repeat an echo Doppler assessment. I'll see him back in the office in 3-4 months for followup evaluation. Next year he will also need to undergo followup carotid Doppler studies for his bilateral carotid  disease.  Troy Sine, MD, [facility]  03/21/2013 1:27 PM

## 2013-03-22 ENCOUNTER — Telehealth: Payer: Self-pay | Admitting: *Deleted

## 2013-03-22 ENCOUNTER — Encounter: Payer: Self-pay | Admitting: *Deleted

## 2013-03-22 NOTE — Telephone Encounter (Signed)
Called and got PA approval for xarelto. Approval number [id number]. Pharmacist at [employer] notified. He states that it still did not go through. He will try to re-run it a little later since it just had been approved.

## 2013-03-28 ENCOUNTER — Encounter: Payer: Self-pay | Admitting: Cardiovascular Disease

## 2013-04-04 ENCOUNTER — Telehealth: Payer: Self-pay | Admitting: Cardiovascular Disease

## 2013-04-04 MED ORDER — CARVEDILOL 3.125 MG PO TABS: 9.3750 mg | ORAL_TABLET | Freq: Two times a day (BID) | ORAL | Status: DC

## 2013-04-04 NOTE — Telephone Encounter (Signed)
Need a new prescription for his Carvedilol. They changes it to 3 pills 2 times a day. He need #[phone].Please call to [phone]

## 2013-04-04 NOTE — Telephone Encounter (Signed)
Refills sent to pharmacy. 

## 2013-04-05 ENCOUNTER — Other Ambulatory Visit: Payer: Self-pay | Admitting: Family Medicine

## 2013-04-05 NOTE — Progress Notes (Signed)
Erroneous encounter

## 2013-04-12 ENCOUNTER — Encounter: Payer: Self-pay | Admitting: Family

## 2013-04-13 ENCOUNTER — Ambulatory Visit: Payer: Medicare Other | Admitting: Family

## 2013-04-13 ENCOUNTER — Other Ambulatory Visit (HOSPITAL_COMMUNITY): Payer: Medicare Other

## 2013-06-14 ENCOUNTER — Ambulatory Visit (HOSPITAL_COMMUNITY)
Admission: RE | Admit: 2013-06-14 | Discharge: 2013-06-14 | Disposition: A | Discharge status: Home / Self Care | Payer: Medicare Other | Source: Ambulatory Visit | Attending: Cardiovascular Disease | Admitting: Cardiovascular Disease

## 2013-06-14 ENCOUNTER — Ambulatory Visit (HOSPITAL_COMMUNITY): Payer: Medicare Other

## 2013-06-14 DIAGNOSIS — I359 Nonrheumatic aortic valve disorder, unspecified: Secondary | ICD-10-CM

## 2013-06-14 DIAGNOSIS — I2589 Other forms of chronic ischemic heart disease: Secondary | ICD-10-CM | POA: Insufficient documentation

## 2013-06-14 DIAGNOSIS — I255 Ischemic cardiomyopathy: Secondary | ICD-10-CM

## 2013-06-14 NOTE — Progress Notes (Signed)
  Echocardiogram 2D Echocardiogram has been performed.  [place], [place] 06/14/2013, 11:30 AM

## 2013-07-05 ENCOUNTER — Encounter: Payer: Self-pay | Admitting: *Deleted

## 2013-07-06 NOTE — Telephone Encounter (Signed)
Encounter Closed--TP 07/06/2013 

## 2013-07-07 NOTE — Telephone Encounter (Signed)
Amber left message for patient to return a call.

## 2013-07-13 ENCOUNTER — Encounter: Payer: Self-pay | Admitting: Cardiovascular Disease

## 2013-07-13 ENCOUNTER — Ambulatory Visit (INDEPENDENT_AMBULATORY_CARE_PROVIDER_SITE_OTHER): Payer: Medicare Other | Admitting: Cardiovascular Disease

## 2013-07-13 VITALS — BP 138/66 | HR 56 | Ht 70.0 in | Wt 192.0 lb

## 2013-07-13 DIAGNOSIS — I2510 Atherosclerotic heart disease of native coronary artery without angina pectoris: Secondary | ICD-10-CM

## 2013-07-13 DIAGNOSIS — Z7901 Long term (current) use of anticoagulants: Secondary | ICD-10-CM

## 2013-07-13 DIAGNOSIS — E782 Mixed hyperlipidemia: Secondary | ICD-10-CM

## 2013-07-13 DIAGNOSIS — I255 Ischemic cardiomyopathy: Secondary | ICD-10-CM

## 2013-07-13 DIAGNOSIS — R5381 Other malaise: Secondary | ICD-10-CM

## 2013-07-13 DIAGNOSIS — I4891 Unspecified atrial fibrillation: Secondary | ICD-10-CM

## 2013-07-13 DIAGNOSIS — E119 Type 2 diabetes mellitus without complications: Secondary | ICD-10-CM

## 2013-07-13 DIAGNOSIS — I739 Peripheral vascular disease, unspecified: Secondary | ICD-10-CM

## 2013-07-13 DIAGNOSIS — I482 Chronic atrial fibrillation, unspecified: Secondary | ICD-10-CM

## 2013-07-13 DIAGNOSIS — N429 Disorder of prostate, unspecified: Secondary | ICD-10-CM

## 2013-07-13 DIAGNOSIS — I2589 Other forms of chronic ischemic heart disease: Secondary | ICD-10-CM

## 2013-07-13 DIAGNOSIS — I5189 Other ill-defined heart diseases: Secondary | ICD-10-CM

## 2013-07-13 DIAGNOSIS — I513 Intracardiac thrombosis, not elsewhere classified: Secondary | ICD-10-CM

## 2013-07-13 DIAGNOSIS — I6529 Occlusion and stenosis of unspecified carotid artery: Secondary | ICD-10-CM | POA: Insufficient documentation

## 2013-07-13 DIAGNOSIS — R5383 Other fatigue: Secondary | ICD-10-CM

## 2013-07-13 LAB — COMPREHENSIVE METABOLIC PANEL
ALK PHOS: 64 U/L (ref 39–117)
ALT: 12 U/L (ref 0–53)
AST: 16 U/L (ref 0–37)
Albumin: 3.8 g/dL (ref 3.5–5.2)
BUN: 18 mg/dL (ref 6–23)
CO2: 28 meq/L (ref 19–32)
CREATININE: 1.12 mg/dL (ref 0.50–1.35)
Calcium: 9 mg/dL (ref 8.4–10.5)
Chloride: 107 mEq/L (ref 96–112)
GLUCOSE: 89 mg/dL (ref 70–99)
Potassium: 4.4 mEq/L (ref 3.5–5.3)
Sodium: 143 mEq/L (ref 135–145)
Total Bilirubin: 0.6 mg/dL (ref 0.2–1.2)
Total Protein: 6.5 g/dL (ref 6.0–8.3)

## 2013-07-13 LAB — CBC
HEMATOCRIT: 33.5 % — AB (ref 39.0–52.0)
Hemoglobin: 11.9 g/dL — ABNORMAL LOW (ref 13.0–17.0)
MCH: 30.4 pg (ref 26.0–34.0)
MCHC: 35.5 g/dL (ref 30.0–36.0)
MCV: 85.7 fL (ref 78.0–100.0)
Platelets: 250 10*3/uL (ref 150–400)
RBC: 3.91 MIL/uL — ABNORMAL LOW (ref 4.22–5.81)
RDW: 14.2 % (ref 11.5–15.5)
WBC: 6.6 10*3/uL (ref 4.0–10.5)

## 2013-07-13 LAB — LIPID PANEL
CHOL/HDL RATIO: 3 ratio
Cholesterol: 166 mg/dL (ref 0–200)
HDL: 56 mg/dL (ref >39)
LDL CALC: 98 mg/dL (ref 0–99)
TRIGLYCERIDES: 61 mg/dL (ref <150)
VLDL: 12 mg/dL (ref 0–40)

## 2013-07-13 LAB — HEMOGLOBIN A1C
HEMOGLOBIN A1C: 6 % — AB (ref <5.7)
Mean Plasma Glucose: 126 mg/dL — ABNORMAL HIGH (ref <117)

## 2013-07-13 LAB — TSH: TSH: 3.939 u[IU]/mL (ref 0.350–4.500)

## 2013-07-13 MED ORDER — PANTOPRAZOLE SODIUM 40 MG PO TBEC: 40.0000 mg | DELAYED_RELEASE_TABLET | Freq: Every day | ORAL | Status: DC

## 2013-07-13 MED ORDER — SIMVASTATIN 40 MG PO TABS: 40.0000 mg | ORAL_TABLET | Freq: Every day | ORAL | Status: DC

## 2013-07-13 NOTE — Patient Instructions (Addendum)
  Your physician has requested that you have a carotid duplex. This test is an ultrasound of the carotid arteries in your neck. It looks at blood flow through these arteries that supply the brain with blood. Allow one hour for this exam. There are no restrictions or special instructions.  Your physician recommends that you return for lab work fasting.  Your physician has recommended you make the following change in your medication: decrease the simvastatin to 40 mg. Cut the 80 mg into 1/2 tablet daily then start new 40 mg prescription.   Your physician recommends that you schedule a follow-up appointment in: 6 months

## 2013-07-13 NOTE — Progress Notes (Signed)
Patient ID: Gregory [name], male   DOB: 1932-11-09, 80 y.o.   MRN: [id number]      HPI: Gregory [name] is a 80 y.o. male returns for followup cardiology evaluation.  He is a former patient of Dr. Rollene Fare.  I had seen him 4 months ago, when he establish cardiology care with me.  Mr. Eisner has a history of known CAD in 2003 underwent CABG revascularization surgery by Dr. Servando Snare and had a LIMA placed to the LAD, vein graft to the obtuse marginal, sequential vein graft to the PDA and posterior lateral branch in the right eye artery. His last cardiac catheterization was in 2008 done by Dr. Tami Ribas. At that time, ejection fraction was 20-25%. He declined consideration for ICD. Subsequent ejection fraction did improve to approximately 35-40% in September 2012 on medical therapy. In the past, he has refused Coumadin therapy and had been maintained on aspirin and Plavix. In 2013 he was hospitalized for appendicitis with abscess requiring laparoscopic appendectomy. He was hospitalized in November 2014 and was felt to have a splenic infarct of embolic etiology. He underwent a transesophageal echocardiogram on 01/23/2013 by Dr. Sallyanne Kuster which revealed an ejection fraction in the range of 15-25%. There was diffuse hypokinesis with severe hypokinesis to akinesis of the anteroseptal apical myocardium. He had increased left ventricular end-diastolic filling pressure and elevated left atrial filling pressure. There was evidence for large solid fixed thrombus occupying the entire atrial appendage with moderate continuous spontaneous echo contrast ("smoke") in the left atrial cavity. There was no evidence for right atrial thrombus. At that time, the patient refused life vest.  He has been on Xarelto for anticoagulation.He denies chest pain. He denies significant shortness of breath. He is unaware of tachdysrhythmias.  Over the past 4 months, he has continued to feel well.  He has permanent atrial fibrillation, but his  rate has been controlled.  He denies any presyncope or syncope.  He denies any chest pain.  A followup echo Doppler study on 06/14/2013.  This shows significant improvement in LV function with an ejection fraction estimated now at 50-55% without regional wall motion abnormalities.  He did have mild aortic root dilatation, mild aortic insufficiency, and mild mitral regurgitation with biatrial enlargement.  He does have severe bilateral carotid artery disease.  His last carotid duplex scan was in April 2014.  He never did have his six-month followup evaluation. He presents for evaluation.  Past Medical History  Diagnosis Date  . Coronary artery disease   . CAD (coronary artery disease),CABG 1993-LIMA to the LAD, SVG to Om and SVG to PDA/PLA, patent on cath 2008. 2003  . CHF (congestive heart failure)   . Heart murmur   . Atrial fibrillation     permanent  . Hypertension   . High cholesterol   . Heart attack 1980's  . Pneumonia     "in the last 5 yrs" (01/22/2013)  . GERD (gastroesophageal reflux disease)   . Stroke     "slightly drags left foot since; recovered qthing else" (01/21/2013)  . Kidney stones     "passed all but one time when he had to have lithotripsy" (01/21/2013)  . Cardiomyopathy 2008    EF 20% but now improved to 40-45%  . 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 SSS (sick sinus syndrome)   . Pulmonary hypertension     Past Surgical History  Procedure Laterality Date  . Laparoscopic appendectomy  03/30/2011    Procedure: APPENDECTOMY LAPAROSCOPIC;  Surgeon: Joyice Faster. Cornett,  MD;  Location: [facility];  Service: General;  Laterality: N/A;  . Appendectomy    . Eye surgery    . Cataract extraction w/ intraocular lens implant Bilateral 2013  . Esophagogastroduodenoscopy  02/01/2012    Procedure: ESOPHAGOGASTRODUODENOSCOPY (EGD);  Surgeon: Beryle Beams, MD;  Location: [facility];  Service: Endoscopy;  Laterality: N/A;  . Coronary artery bypass graft  03/01/2002    LIMA to LAD, reverse  SVG to OM, reverse SVG to PDA of RCA, reverse SVG to PLA of RCA, ligation of LA appendage (Dr. Servando Snare)  . Lithotripsy      "once" (01/21/2013)  . Tee without cardioversion N/A 01/23/2013    Procedure: TRANSESOPHAGEAL ECHOCARDIOGRAM (TEE);  Surgeon: Sanda Klein, MD;  Location: [facility];  Service: Cardiovascular;  Laterality: N/A;  . Transthoracic echocardiogram  06/23/2012    EF 40-45%, mild LVH; mild AV regurg; mild MV regurg; LV mod-severely dilated; RV mildly dilated; systolic pressure borderline increased; RA mod dilated  . Nm myocar perf wall motion  05/2009    persantine myoview - fixed moderate perfusion defect in inferior wall & lateral segment of apex (poor non-transmural infarction), minimal anterolateral periinfarct reversible ischemia seen, abnormal study, defects similar to 2006 study  . Coronary angioplasty  07/05/2006    3 vessel CAD, patent LIMA to LAD, patentVG to OM, patent SVG to PDA & PLA, mild MR, severe LV systolic dysfunction (Dr. Gerrie Nordmann)  . Cardiac catheterization  02/24/2002    reduced LV function, 60-70% prox RCA stenosis, 70% PLA ostial stenosis, 80% secondary branch of PLA stenosis - subsequent CABG (Dr. Jackie Plum)  . Carotid doppler  06/2012    50-69% right bulb/prox ICA diameter reduction; 70-99% left bulb/prox ICA diameter reduction    Allergies  Allergen Reactions  . Percocet [Oxycodone-Acetaminophen] Itching and Nausea Only    Current Outpatient Prescriptions  Medication Sig Dispense Refill  . aspirin EC 81 MG tablet Take 81 mg by mouth daily.      . carvedilol (COREG) 3.125 MG tablet Take 3 tablets (9.375 mg total) by mouth 2 (two) times daily with a meal.  180 tablet  10  . furosemide (LASIX) 40 MG tablet Take 40 mg by mouth daily.      Marland Kitchen lisinopril (PRINIVIL,ZESTRIL) 10 MG tablet Take 10 mg by mouth daily.      . pantoprazole (PROTONIX) 40 MG tablet Take 1 tablet (40 mg total) by mouth daily.  90 tablet  1  . potassium chloride SA  (K-DUR,KLOR-CON) 20 MEQ tablet Take 20 mEq by mouth daily.      . Rivaroxaban (XARELTO) 20 MG TABS tablet Take 1 tablet (20 mg total) by mouth daily before supper.  30 tablet  5  . simvastatin (ZOCOR) 40 MG tablet Take 1 tablet (40 mg total) by mouth at bedtime.  90 tablet  3   No current facility-administered medications for this visit.    History   Social History  . Marital Status: Married    Spouse Name: N/A    Number of Children: 8  . Years of Education: 8   Occupational History  . Not on file.   Social History Main Topics  . Smoking status: Former Smoker -- 2.00 packs/day for 20 years    Types: Cigarettes    Quit date: 04/08/1986  . Smokeless tobacco: Current User    Types: Chew  . Alcohol Use: No  . Drug Use: No  . Sexual Activity: Not on file   Other Topics Concern  .  Not on file   Social History Narrative  . No narrative on file    Family History  Problem Relation Age of Onset  . Heart disease Father     rheumatic fever  . Heart attack Brother 37  . Heart disease Daughter   . Stroke Mother   . Stroke Brother 68    ROS is negative for fevers, chills or night sweats. He denies headache. He denies change in vision or hearing. He is unaware of lymphadenopathy. He denies wheezing. He denies PND orthopnea. There is no presyncope or syncope. He denies abdominal pain.  He does have GERD. He is unaware of blood in stool or urine. He denies significant edema. There is no claudication. He does have a history of renal calculi. He does have bilateral moderate to severe carotid disease with his last Doppler study in April 2014. There is remote CVA.  He denies musculoskeletal symptoms. There are no seizures.  There is no diabetes.  He denies cold or heat intolerance. Other comprehensive 14 point system review is negative.  PE BP 138/66  Pulse 56  Ht 5\' 10"  (1.778 m)  Wt 192 lb (87.091 kg)  BMI 27.55 kg/m2  General: Alert, oriented, no distress.  Full bearded. Skin:  normal turgor, no rashes HEENT: Normocephalic, atraumatic. Pupils round and reactive; sclera anicteric;no lid lag.  Nose without nasal septal hypertrophy Mouth/Parynx benign; Mallinpatti scale 3 Neck: No JVD, bilateral carotid bruits with slightly delayed upstroke. Lungs: clear to ausculatation and percussion; no wheezing or rales Chest wall: no tenderness to palpitation Heart: Irregularly irregular rhythm compatible with atrial fibrillation with a controlled ventricular response, s1 s2 normal 2/6 systolic murmur. No S3 gallop.  No diastolic murmur.  No rubs, thrills or heaves to Abdomen: soft, nontender; no hepatosplenomehaly, BS+; abdominal aorta nontender and not dilated by palpation. Back: no CVA tenderness Pulses 2+ Extremities: no clubbing cyanosis or edema, Homan's sign negative  Neurologic: grossly nonfocal Psychologic: normal affect and mood.  ECG (independently read by me): Atrial fibrillation at 56 beats per minute.  QTc interval 395 ms  Prior 02/23/2013 ECG: Atrial fibrillation at 80 beats per minute with isolated PVC.  LABS:  BMET    Component Value Date/Time   NA 140 02/16/2013 1109   K 4.8 02/16/2013 1109   CL 104 02/16/2013 1109   CO2 28 02/16/2013 1109   GLUCOSE 84 02/16/2013 1109   BUN 19 02/16/2013 1109   CREATININE 1.00 02/16/2013 1109   CREATININE 1.03 01/25/2013 0530   CALCIUM 8.7 02/16/2013 1109   GFRNONAA 67* 01/25/2013 0530   GFRAA 78* 01/25/2013 0530     Hepatic Function Panel     Component Value Date/Time   PROT 7.3 01/20/2013 1622   ALBUMIN 4.0 01/20/2013 1622   AST 23 01/20/2013 1622   ALT 20 01/20/2013 1622   ALKPHOS 77 01/20/2013 1622   BILITOT 0.7 01/20/2013 1622   BILIDIR 0.2 05/10/2009 0506   IBILI 1.1* 05/10/2009 0506     CBC    Component Value Date/Time   WBC 4.9 02/16/2013 1114   WBC 9.7 01/25/2013 0530   RBC 3.65* 02/16/2013 1114   RBC 3.36* 01/25/2013 0530   HGB 10.7* 02/16/2013 1114   HGB 10.4* 01/25/2013 0530   HCT 35.5* 02/16/2013  1114   HCT 31.3* 01/25/2013 0530   PLT 201 01/25/2013 0530   MCV 97.2* 02/16/2013 1114   MCV 93.2 01/25/2013 0530   MCH 29.3 02/16/2013 1114   MCH 31.0 01/25/2013 0530  MCHC 30.1* 02/16/2013 1114   MCHC 33.2 01/25/2013 0530   RDW 13.9 01/25/2013 0530   LYMPHSABS 2.0 11/30/2012 1049   MONOABS 0.8 11/30/2012 1049   EOSABS 0.2 11/30/2012 1049   BASOSABS 0.0 11/30/2012 1049     BNP    Component Value Date/Time   PROBNP 2061.0* 04/03/2011 0458    Lipid Panel     Component Value Date/Time   CHOL 172 11/30/2012 1049   TRIG 72 11/30/2012 1049   HDL 52 11/30/2012 1049   CHOLHDL 3.3 11/30/2012 1049   VLDL 14 11/30/2012 1049   LDLCALC 106* 11/30/2012 1049     RADIOLOGY: No results found.    ASSESSMENT AND PLAN: Mr. Rilan Honeck is a 80 year old gentleman who is 12 years status post CABG revascularization surgery. A history of an ischemic cardiomyopathymyopathy and ejection fraction had been in the 25% with subsequent improvement to approximately 35-40%. LAst year he developed a splenic infarct most likely due to his emboli arising from his atrial fibrillation. He has documented extensive thrombus in his left atrium. He now is on anticoagulation therapy which needs to be lifelong. Presently he is tolerating ACE inhibition with lisinopril 10 mg and beta blocker therapy, currently taking carvedilol 9.375 mg twice a day. In the past he has refused an ICD and most recently has refused lifetest.  His most recent echo Doppler study, now shows marked improvement of LV function on his current medical therapy.  Ejection fraction was estimated at 50-55%.  His atrial fibrillation.  Rate is well-controlled.  He is on Xarelto for anticoagulation.  He does have a history of hyperlipidemia.  I've recommended he reduce his simvastatin to 40 mg, which is the maximum recommended dose.  He does not have signs of CHF presently on exam.  He is fasting today and laboratory will be checked consisting of a CBC, chemistry  profile, lipid panel, TSH.  I am also recommending he undergo bilateral carotid duplex imaging to reassess his significant carotid stenoses.  I will see him in the office in 6 months for followup evaluation.    Troy Sine, MD, [facility]  07/13/2013 8:41 AM

## 2013-07-14 LAB — PSA: PSA: 1.24 ng/mL (ref <=4.00)

## 2013-07-21 ENCOUNTER — Ambulatory Visit (HOSPITAL_COMMUNITY)
Admission: RE | Admit: 2013-07-21 | Discharge: 2013-07-21 | Disposition: A | Discharge status: Home / Self Care | Payer: Medicare Other | Source: Ambulatory Visit | Attending: Internal Medicine | Admitting: Internal Medicine

## 2013-07-21 DIAGNOSIS — I739 Peripheral vascular disease, unspecified: Secondary | ICD-10-CM

## 2013-07-21 NOTE — Progress Notes (Signed)
Carotid Duplex Completed. °Brianna L Mazza,RVT °

## 2013-07-28 ENCOUNTER — Telehealth: Payer: Self-pay | Admitting: *Deleted

## 2013-07-28 NOTE — Telephone Encounter (Signed)
Left message following up on earlier phone conversation in reference to patient coming in for consult appointment with Dr. Gwenlyn Found to discuss abnormal carotid doppler. Message will be sent to the scheduler to give them a call. Per wife in our earlier conversation the patient has in the past has refused treatment.

## 2013-07-28 NOTE — Telephone Encounter (Signed)
Called patient's wife and left a message to have him return a call to me.

## 2013-07-31 ENCOUNTER — Telehealth: Payer: Self-pay | Admitting: Cardiovascular Disease

## 2013-07-31 NOTE — Telephone Encounter (Signed)
Patient called and stated that he did not want to schedule to see Dr. Gwenlyn Found to discuss his tests results at this time.  He wants to wait until October when he comes back to see Dr. Claiborne Billings.

## 2013-08-10 ENCOUNTER — Ambulatory Visit (INDEPENDENT_AMBULATORY_CARE_PROVIDER_SITE_OTHER): Payer: Medicare Other | Admitting: Cardiovascular Disease

## 2013-08-10 ENCOUNTER — Encounter: Payer: Self-pay | Admitting: Cardiovascular Disease

## 2013-08-10 VITALS — BP 122/67 | HR 75 | Ht 70.0 in | Wt 190.0 lb

## 2013-08-10 DIAGNOSIS — R6889 Other general symptoms and signs: Secondary | ICD-10-CM

## 2013-08-10 NOTE — Assessment & Plan Note (Signed)
Mr. Zero was referred to me by Dr. Ellouise Newer for evaluation of carotid artery disease. He does have a history of ischemic myopathy status post remote bypass grafting with subsequent improvement in LV function. He has chronic A. Fib on Xarelto  Anticoagulation. He has never had a stroke. He's had progression of his left internal carotid artery stenosis by duplex ultrasound of the last year now probably in the 90+ percent range. I'm referring him to Dr. Annamarie Major  for vascular surgical consultation.

## 2013-08-10 NOTE — Progress Notes (Signed)
08/10/2013 Gregory [name]   [birth year]  [id number]  Primary Physician GUEST, Gregory Melter, MD Primary Cardiologist: Gregory Harp MD Gregory [name]   HPI:  Gregory [name] is a 80 year old mildly overweight married Caucasian male patient of Gregory [name] referred to me for peripheral vascular evaluation because of high-grade asymptomatic left internal carotid artery stenosis. History of ischemic heart disease status post remote coronary artery bypass grafting by Gregory [name] in 2003. He has had LV dysfunction in the past which has improved over time now with an EF of 50-55%. He has chronic A. Fib on lifelong oral anticoagulation (Xarelto). He has had a splenic infarct in the past related to A. Fib. He's had duplex ultrasound of his carotid arteries that showed significant progression of disease her last year now probably in the 90% range or greater. His other problems include hypertension and hyperlipidemia as well as pulmonary hypertension.   Current Outpatient Prescriptions  Medication Sig Dispense Refill  . aspirin EC 81 MG tablet Take 81 mg by mouth daily.      . carvedilol (COREG) 3.125 MG tablet Take 3 tablets (9.375 mg total) by mouth 2 (two) times daily with a meal.  180 tablet  10  . furosemide (LASIX) 40 MG tablet Take 40 mg by mouth daily.      Marland Kitchen lisinopril (PRINIVIL,ZESTRIL) 10 MG tablet Take 10 mg by mouth daily.      . pantoprazole (PROTONIX) 40 MG tablet Take 1 tablet (40 mg total) by mouth daily.  90 tablet  1  . potassium chloride SA (K-DUR,KLOR-CON) 20 MEQ tablet Take 20 mEq by mouth daily.      . Rivaroxaban (XARELTO) 20 MG TABS tablet Take 1 tablet (20 mg total) by mouth daily before supper.  30 tablet  5  . simvastatin (ZOCOR) 40 MG tablet Take 1 tablet (40 mg total) by mouth at bedtime.  90 tablet  3   No current facility-administered medications for this visit.    Allergies  Allergen Reactions  . Percocet [Oxycodone-Acetaminophen] Itching and Nausea  Only    History   Social History  . Marital Status: Married    Spouse Name: N/A    Number of Children: 8  . Years of Education: 8   Occupational History  . Not on file.   Social History Main Topics  . Smoking status: Former Smoker -- 2.00 packs/day for 20 years    Types: Cigarettes    Quit date: 04/08/1986  . Smokeless tobacco: Current User    Types: Chew  . Alcohol Use: No  . Drug Use: No  . Sexual Activity: Not on file   Other Topics Concern  . Not on file   Social History Narrative  . No narrative on file     Review of Systems: General: negative for chills, fever, night sweats or weight changes.  Cardiovascular: negative for chest pain, dyspnea on exertion, edema, orthopnea, palpitations, paroxysmal nocturnal dyspnea or shortness of breath Dermatological: negative for rash Respiratory: negative for cough or wheezing Urologic: negative for hematuria Abdominal: negative for nausea, vomiting, diarrhea, bright red blood per rectum, melena, or hematemesis Neurologic: negative for visual changes, syncope, or dizziness All other systems reviewed and are otherwise negative except as noted above.    Blood pressure 122/67, pulse 75, height 5\' 10"  (1.778 m), weight 190 lb (86.183 kg).  General appearance: alert and no distress Neck: no adenopathy, no JVD, supple, symmetrical, trachea midline, thyroid not enlarged, symmetric, no tenderness/mass/nodules  and left carotid bruit Lungs: clear to auscultation bilaterally Heart: irregularly irregular rhythm Extremities: extremities normal, atraumatic, no cyanosis or edema  EKG not performed today  ASSESSMENT AND PLAN:   Carotid stenosis Gregory [name] was referred to me by Gregory [name] for evaluation of carotid artery disease. He does have a history of ischemic myopathy status post remote bypass grafting with subsequent improvement in LV function. He has chronic A. Fib on Xarelto  Anticoagulation. He has never had a stroke. He's  had progression of his left internal carotid artery stenosis by duplex ultrasound of the last year now probably in the 90+ percent range. I'm referring him to Gregory [name]  for vascular surgical consultation.      Gregory Harp MD FACP,FACC,FAHA, [facility] 08/10/2013 2:24 PM

## 2013-08-10 NOTE — Patient Instructions (Addendum)
You have been referred to Dr. Trula Slade for evaluation of your abnormal carotid study.  Your physician recommends that you schedule a follow-up appointment in: as needed with Dr. Gwenlyn Found.

## 2013-08-17 ENCOUNTER — Telehealth: Payer: Self-pay | Admitting: Cardiovascular Disease

## 2013-08-17 NOTE — Telephone Encounter (Signed)
The pharmacy is [phone].

## 2013-08-17 NOTE — Telephone Encounter (Signed)
Pt said he went to the pharmacy to get one of his medicine and said they would not refill it. They said his doctor office would need to call,the pt did not know the name of the medicine.She said the pharmacist would know which one it was.

## 2013-08-17 NOTE — Telephone Encounter (Signed)
Spoke with patient to see what medication needed to be refilled and he stated he had no idea, that [facility] knew and he gave them the bottles so he could not tell me which was empty. After speaking to patient I called [facility] and the [employer] was unsure of what medication needed to be filled as well but that the Lisinopril was out and had no refills so she thought that may be it. Verbal order for Lisinopril 10mg  QD was given for #30 with 6 additional refills.

## 2013-08-29 ENCOUNTER — Other Ambulatory Visit: Payer: Self-pay | Admitting: *Deleted

## 2013-08-29 DIAGNOSIS — I6529 Occlusion and stenosis of unspecified carotid artery: Secondary | ICD-10-CM

## 2013-09-19 ENCOUNTER — Other Ambulatory Visit: Payer: Self-pay | Admitting: Cardiovascular Disease

## 2013-09-19 NOTE — Telephone Encounter (Signed)
Rx was sent to pharmacy electronically. 

## 2013-09-21 ENCOUNTER — Encounter: Payer: Self-pay | Admitting: *Deleted

## 2013-09-22 ENCOUNTER — Encounter: Payer: Self-pay | Admitting: *Deleted

## 2013-09-22 NOTE — Progress Notes (Signed)
LMTCB for lab results.  

## 2013-10-16 ENCOUNTER — Encounter: Payer: Medicare Other | Admitting: Surgery

## 2013-10-16 ENCOUNTER — Other Ambulatory Visit (HOSPITAL_COMMUNITY): Payer: Medicare Other

## 2013-10-17 ENCOUNTER — Other Ambulatory Visit: Payer: Self-pay | Admitting: Cardiovascular Disease

## 2013-10-25 ENCOUNTER — Telehealth: Payer: Self-pay | Admitting: Cardiovascular Disease

## 2013-10-25 MED ORDER — POTASSIUM CHLORIDE CRYS ER 20 MEQ PO TBCR
20.0000 meq | EXTENDED_RELEASE_TABLET | Freq: Every day | ORAL | Status: DC
Start: 2013-10-25 — End: 2014-05-11

## 2013-10-25 NOTE — Telephone Encounter (Signed)
Rx refill was sent in to patient pharmacy

## 2013-10-25 NOTE — Telephone Encounter (Signed)
Pharmacy is sending another request for a refill for the patient's potassium.

## 2014-01-10 ENCOUNTER — Telehealth (HOSPITAL_COMMUNITY): Payer: Self-pay | Admitting: *Deleted

## 2014-01-23 ENCOUNTER — Encounter: Payer: Self-pay | Admitting: Cardiovascular Disease

## 2014-01-23 ENCOUNTER — Ambulatory Visit (INDEPENDENT_AMBULATORY_CARE_PROVIDER_SITE_OTHER): Payer: Medicare Other | Admitting: Cardiovascular Disease

## 2014-01-23 VITALS — BP 120/70 | HR 73 | Ht 70.0 in | Wt 199.0 lb

## 2014-01-23 DIAGNOSIS — I482 Chronic atrial fibrillation, unspecified: Secondary | ICD-10-CM

## 2014-01-23 DIAGNOSIS — D735 Infarction of spleen: Secondary | ICD-10-CM

## 2014-01-23 DIAGNOSIS — Z79899 Other long term (current) drug therapy: Secondary | ICD-10-CM

## 2014-01-23 DIAGNOSIS — E782 Mixed hyperlipidemia: Secondary | ICD-10-CM

## 2014-01-23 DIAGNOSIS — I2510 Atherosclerotic heart disease of native coronary artery without angina pectoris: Secondary | ICD-10-CM

## 2014-01-23 DIAGNOSIS — I255 Ischemic cardiomyopathy: Secondary | ICD-10-CM

## 2014-01-23 DIAGNOSIS — I639 Cerebral infarction, unspecified: Secondary | ICD-10-CM

## 2014-01-23 DIAGNOSIS — Z7901 Long term (current) use of anticoagulants: Secondary | ICD-10-CM

## 2014-01-23 DIAGNOSIS — I6529 Occlusion and stenosis of unspecified carotid artery: Secondary | ICD-10-CM

## 2014-01-23 LAB — COMPREHENSIVE METABOLIC PANEL
ALT: 11 U/L (ref 0–53)
AST: 16 U/L (ref 0–37)
Albumin: 3.6 g/dL (ref 3.5–5.2)
Alkaline Phosphatase: 62 U/L (ref 39–117)
BUN: 26 mg/dL — ABNORMAL HIGH (ref 6–23)
CALCIUM: 8.9 mg/dL (ref 8.4–10.5)
CO2: 27 mEq/L (ref 19–32)
Chloride: 108 mEq/L (ref 96–112)
Creat: 1.22 mg/dL (ref 0.50–1.35)
Glucose, Bld: 89 mg/dL (ref 70–99)
Potassium: 4.9 mEq/L (ref 3.5–5.3)
Sodium: 143 mEq/L (ref 135–145)
Total Bilirubin: 0.5 mg/dL (ref 0.2–1.2)
Total Protein: 6.5 g/dL (ref 6.0–8.3)

## 2014-01-23 LAB — LIPID PANEL
Cholesterol: 152 mg/dL (ref 0–200)
HDL: 44 mg/dL (ref >39)
LDL Cholesterol: 96 mg/dL (ref 0–99)
Total CHOL/HDL Ratio: 3.5 Ratio
Triglycerides: 62 mg/dL (ref <150)
VLDL: 12 mg/dL (ref 0–40)

## 2014-01-23 LAB — CBC
HCT: 33.3 % — ABNORMAL LOW (ref 39.0–52.0)
Hemoglobin: 11.4 g/dL — ABNORMAL LOW (ref 13.0–17.0)
MCH: 30.7 pg (ref 26.0–34.0)
MCHC: 34.2 g/dL (ref 30.0–36.0)
MCV: 89.8 fL (ref 78.0–100.0)
PLATELETS: 237 10*3/uL (ref 150–400)
RBC: 3.71 MIL/uL — ABNORMAL LOW (ref 4.22–5.81)
RDW: 14.1 % (ref 11.5–15.5)
WBC: 6.2 10*3/uL (ref 4.0–10.5)

## 2014-01-23 LAB — TSH: TSH: 4.403 u[IU]/mL (ref 0.350–4.500)

## 2014-01-23 NOTE — Patient Instructions (Addendum)
You have been referred to Dr. Harold Barban to evaluate your carotid stenosis.  Your physician recommends that you return for lab work in: fasting.  Your physician wants you to follow-up in: January 2016.

## 2014-01-23 NOTE — Progress Notes (Signed)
Patient ID: Gregory [name], male   DOB: Jul 06, 1932, 80 y.o.   MRN: [id number]      HPI: Gregory [name] is a 80 y.o. male returns for a 6 month followup cardiology evaluation.  He is a former patient of Dr. Rollene Fare.    Gregory [name] has a history of known CAD in 2003 underwent CABG revascularization surgery by Dr. Servando Snare  (LIMA  to  LAD, vein graft to the obtuse marginal, sequential vein graft to the PDA and PLA of RCA).  His last cardiac catheterization was in 2008 done by Dr. Tami Ribas. At that time, ejection fraction was 20-25%. He declined consideration for ICD. Subsequent ejection fraction improved to approximately 35-40% in September 2012 on medical therapy. In the past, he has refused Coumadin therapy and had been maintained on aspirin and Plavix. In 2013 he was hospitalized for appendicitis with abscess requiring laparoscopic appendectomy. He was hospitalized in November 2014 and was felt to have a splenic infarct of embolic etiology. He underwent a transesophageal echocardiogram on 01/23/2013 by Dr. Sallyanne Kuster which revealed an ejection fraction in the range of 15-25%. There was diffuse hypokinesis with severe hypokinesis to akinesis of the anteroseptal apical myocardium. He had increased left ventricular end-diastolic filling pressure and elevated left atrial filling pressure. There was evidence for large solid fixed thrombus occupying the entire atrial appendage with moderate continuous spontaneous echo contrast ("smoke") in the left atrial cavity. There was no evidence for right atrial thrombus. At that time, the patient refused life vest.  He has been on Xarelto for anticoagulation.He denies chest pain. He denies significant shortness of breath. He is unaware of tachdysrhythmias.   He has permanent atrial fibrillation, but his rate has been controlled.  He denies any presyncope or syncope.  He denies any chest pain.  A followup echo Doppler study on 06/14/2013 revealed significant improvement in  LV function with an ejection fraction estimated now at 50-55% without regional wall motion abnormalities.  He did have mild aortic root dilatation, mild aortic insufficiency, and mild mitral regurgitation with biatrial enlargement.    He has severe bilateral carotid artery disease.when I last saw him, I scheduled him for follow up carotid duplex study.  This was significantly abnormal with his left carotid velocities increased to [id number] systolic and [id number] diastolic, CMP her second with severe fibrous plaque and elevated velocities within all segments of his left internal carotid.  There also was 50-69% diameter reduction in the right bulb/proximal ICA. I referred him to Dr. Gwenlyn Found her subsequent referred him to Dr. Velta Addison.  Apparently the patient never followed up with his carotid evaluation with Dr. Trula Slade. He denies any neurologic symptoms.  He denies chest pain.  He denies palpitations.  He denies shortness of breath.  He presents for follow-up cardiology evaluation.  Past Medical History  Diagnosis Date  . Coronary artery disease   . CAD (coronary artery disease),CABG 1993-LIMA to the LAD, SVG to Om and SVG to PDA/PLA, patent on cath 2008. 2003  . CHF (congestive heart failure)   . Heart murmur   . Atrial fibrillation     permanent  . Hypertension   . High cholesterol   . Heart attack 1980's  . Pneumonia     "in the last 5 yrs" (01/22/2013)  . GERD (gastroesophageal reflux disease)   . Stroke     "slightly drags left foot since; recovered qthing else" (01/21/2013)  . Kidney stones     "passed all but one time when he had  to have lithotripsy" (01/21/2013)  . Cardiomyopathy 2008    EF 20% but now improved to 40-45%  . 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 SSS (sick sinus syndrome)   . Pulmonary hypertension   . Carotid artery disease     Past Surgical History  Procedure Laterality Date  . Laparoscopic appendectomy  03/30/2011    Procedure: APPENDECTOMY LAPAROSCOPIC;  Surgeon: Joyice Faster. Cornett,  MD;  Location: [facility];  Service: General;  Laterality: N/A;  . Appendectomy    . Eye surgery    . Cataract extraction w/ intraocular lens implant Bilateral 2013  . Esophagogastroduodenoscopy  02/01/2012    Procedure: ESOPHAGOGASTRODUODENOSCOPY (EGD);  Surgeon: Beryle Beams, MD;  Location: [facility];  Service: Endoscopy;  Laterality: N/A;  . Coronary artery bypass graft  03/01/2002    LIMA to LAD, reverse SVG to OM, reverse SVG to PDA of RCA, reverse SVG to PLA of RCA, ligation of LA appendage (Dr. Servando Snare)  . Lithotripsy      "once" (01/21/2013)  . Tee without cardioversion N/A 01/23/2013    Procedure: TRANSESOPHAGEAL ECHOCARDIOGRAM (TEE);  Surgeon: Sanda Klein, MD;  Location: [facility] END[facility];  Service: Cardiovascular;  Laterality: N/A;  . Transthoracic echocardiogram  06/23/2012    EF 40-45%, mild LVH; mild AV regurg; mild MV regurg; LV mod-severely dilated; RV mildly dilated; systolic pressure borderline increased; RA mod dilated  . Nm myocar perf wall motion  05/2009    persantine myoview - fixed moderate perfusion defect in inferior wall & lateral segment of apex (poor non-transmural infarction), minimal anterolateral periinfarct reversible ischemia seen, abnormal study, defects similar to 2006 study  . Coronary angioplasty  07/05/2006    3 vessel CAD, patent LIMA to LAD, patentVG to OM, patent SVG to PDA & PLA, mild MR, severe LV systolic dysfunction (Dr. Gerrie Nordmann)  . Cardiac catheterization  02/24/2002    reduced LV function, 60-70% prox RCA stenosis, 70% PLA ostial stenosis, 80% secondary branch of PLA stenosis - subsequent CABG (Dr. Jackie Plum)  . Carotid doppler  06/2012    50-69% right bulb/prox ICA diameter reduction; 70-99% left bulb/prox ICA diameter reduction    Allergies  Allergen Reactions  . Percocet [Oxycodone-Acetaminophen] Itching and Nausea Only    Current Outpatient Prescriptions  Medication Sig Dispense Refill  . aspirin EC 81 MG tablet Take 81 mg by mouth daily.     . carvedilol (COREG) 3.125 MG tablet Take 3 tablets (9.375 mg total) by mouth 2 (two) times daily with a meal. 180 tablet 10  . furosemide (LASIX) 40 MG tablet TAKE 1 TABLET BY MOUTH EVERY DAY 30 tablet 9  . lisinopril (PRINIVIL,ZESTRIL) 10 MG tablet Take 10 mg by mouth daily.    . pantoprazole (PROTONIX) 40 MG tablet Take 1 tablet (40 mg total) by mouth daily. 90 tablet 1  . potassium chloride SA (K-DUR,KLOR-CON) 20 MEQ tablet Take 1 tablet (20 mEq total) by mouth daily. 30 tablet 5  . simvastatin (ZOCOR) 40 MG tablet Take 1 tablet (40 mg total) by mouth at bedtime. 90 tablet 3  . XARELTO 20 MG TABS tablet TAKE 1 TABLET BY MOUTH EVERY DAY BEFORE SUPPER 30 tablet 5   No current facility-administered medications for this visit.    History   Social History  . Marital Status: Married    Spouse Name: N/A    Number of Children: 8  . Years of Education: 8   Occupational History  . Not on file.   Social History Main Topics  .  Smoking status: Former Smoker -- 2.00 packs/day for 20 years    Types: Cigarettes    Quit date: 04/08/1986  . Smokeless tobacco: Current User    Types: Chew  . Alcohol Use: No  . Drug Use: No  . Sexual Activity: Not on file   Other Topics Concern  . Not on file   Social History Narrative    Family History  Problem Relation Age of Onset  . Heart disease Father     rheumatic fever  . Heart attack Brother 42  . Heart disease Daughter   . Stroke Mother   . Stroke Brother 68   ROS General: Negative; No fevers, chills, or night sweats;  HEENT: Negative; No changes in vision or hearing, sinus congestion, difficulty swallowing Pulmonary: Negative; No cough, wheezing, shortness of breath, hemoptysis Cardiovascular: Negative; No chest pain, presyncope, syncope, palpitations GI: positive for GERD; No nausea, vomiting, diarrhea, or abdominal pain GU: Negative; No dysuria, hematuria, or difficulty voiding Musculoskeletal: Negative; no myalgias, joint pain,  or weakness Hematologic/Oncology: Negative; no easy bruising, bleeding Endocrine: Negative; no heat/cold intolerance; no diabetes Neuro: positive for remote CVA Skin: Negative; No rashes or skin lesions Psychiatric: Negative; No behavioral problems, depression Sleep: Negative; No snoring, daytime sleepiness, hypersomnolence, bruxism, restless legs, hypnogognic hallucinations, no cataplexy Other comprehensive 14 point system review is negative.   PE BP 120/70 mmHg  Pulse 73  Ht 5\' 10"  (1.778 m)  Wt 199 lb (90.266 kg)  BMI 28.55 kg/m2  General: Alert, oriented, no distress.  Full bearded. Skin: normal turgor, no rashes HEENT: Normocephalic, atraumatic. Pupils round and reactive; sclera anicteric;no lid lag.  Nose without nasal septal hypertrophy Mouth/Parynx benign; Mallinpatti scale 3 Neck: No JVD, bilateral carotid bruits with slightly delayed upstroke. Lungs: clear to ausculatation and percussion; no wheezing or rales Chest wall: no tenderness to palpitation Heart: Irregularly irregular rhythm compatible with atrial fibrillation with a controlled ventricular response, s1 s2 normal 2/6 systolic murmur. No S3 gallop.  No diastolic murmur.  No rubs, thrills or heaves to Abdomen: soft, nontender; no hepatosplenomehaly, BS+; abdominal aorta nontender and not dilated by palpation. Back: no CVA tenderness Pulses 2+ Extremities: no clubbing cyanosis or edema, Homan's sign negative  Neurologic: grossly nonfocal Psychologic: normal affect and mood.  ECG (independently read by me): Atrial fibrillation with controlled ventricular response in the 70s.  Poor anterior R-wave progression.  Prior April 2015 ECG (independently read by me): Atrial fibrillation at 56 beats per minute.  QTc interval 395 ms  Prior 02/23/2013 ECG: Atrial fibrillation at 80 beats per minute with isolated PVC.  LABS:  BMET    Component Value Date/Time   NA 143 07/13/2013 0857   K 4.4 07/13/2013 0857   CL 107  07/13/2013 0857   CO2 28 07/13/2013 0857   GLUCOSE 89 07/13/2013 0857   BUN 18 07/13/2013 0857   CREATININE 1.12 07/13/2013 0857   CREATININE 1.03 01/25/2013 0530   CALCIUM 9.0 07/13/2013 0857   GFRNONAA 67* 01/25/2013 0530   GFRAA 78* 01/25/2013 0530     Hepatic Function Panel     Component Value Date/Time   PROT 6.5 07/13/2013 0857   ALBUMIN 3.8 07/13/2013 0857   AST 16 07/13/2013 0857   ALT 12 07/13/2013 0857   ALKPHOS 64 07/13/2013 0857   BILITOT 0.6 07/13/2013 0857   BILIDIR 0.2 05/10/2009 0506   IBILI 1.1* 05/10/2009 0506     CBC    Component Value Date/Time   WBC 6.6 07/13/2013 0855  WBC 4.9 02/16/2013 1114   RBC 3.91* 07/13/2013 0855   RBC 3.65* 02/16/2013 1114   HGB 11.9* 07/13/2013 0855   HGB 10.7* 02/16/2013 1114   HCT 33.5* 07/13/2013 0855   HCT 35.5* 02/16/2013 1114   PLT 250 07/13/2013 0855   MCV 85.7 07/13/2013 0855   MCV 97.2* 02/16/2013 1114   MCH 30.4 07/13/2013 0855   MCH 29.3 02/16/2013 1114   MCHC 35.5 07/13/2013 0855   MCHC 30.1* 02/16/2013 1114   RDW 14.2 07/13/2013 0855   LYMPHSABS 2.0 11/30/2012 1049   MONOABS 0.8 11/30/2012 1049   EOSABS 0.2 11/30/2012 1049   BASOSABS 0.0 11/30/2012 1049     BNP    Component Value Date/Time   PROBNP 2061.0* 04/03/2011 0458    Lipid Panel     Component Value Date/Time   CHOL 166 07/13/2013 0855   TRIG 61 07/13/2013 0855   HDL 56 07/13/2013 0855   CHOLHDL 3.0 07/13/2013 0855   VLDL 12 07/13/2013 0855   LDLCALC 98 07/13/2013 0855     RADIOLOGY: No results found.    ASSESSMENT AND PLAN: Gregory [name] is a 80 year old gentleman who is status post CABG revascularization surgery in 2003. He has a history of an  ischemic cardiomyopathy with an initial ejection fraction of 25% with subsequent improvement to approximately 35-40%.  He has a remote history of a small right brain CVA in 2005 with good with subsequent recovery. Last year he developed a splenic infarct most likely due to his  emboli arising from his atrial fibrillation. He has documented extensive thrombus in his left atrium. He now is on anticoagulation therapy which needs to be lifelong. Presently he is tolerating ACE inhibition with lisinopril 10 mg and beta blocker therapy, currently taking carvedilol 9.375 mg twice a day. In the past he has refused an ICD and most recently has refused lifetest.  His most recent echo Doppler study, now shows marked improvement of LV function on his current medical therapy.  Ejection fraction was estimated at 50-55%. He has permanent atrial fibrillation, his rate is well-controlled and he is on Xarelto for anticoagulation.  He does have a history of hyperlipidemia on simvastatin 40 mg, which is the maximum recommended dose.  He does not have signs of CHF presently on exam.  I again reviewed with him his severe carotid disease, and most recent carotid duplex study.  I discussed with him at length significant increased risk for subsequent stroke due to his high-grade carotid disease.  I emphasized with him the importance of follow-up evaluation.  I recommended he see Dr. Trula Slade for evaluation and he along with Dr. Gwenlyn Found will then decide if carotid stent versus endarterectomy is preferable. In April, his LDL was 98.  I will repeat fasting laboratory.  LDL is less than 70 and if this remains low.  Adjustments will be made to his medical regimen. He will most likely need a nuclear perfusion study prior to any carotid surgery.I will see him in 2 months for reevaluation or sooner if problem arises.  Troy Sine, MD, [facility]  01/23/2014 8:06 AM

## 2014-01-29 ENCOUNTER — Other Ambulatory Visit: Payer: Self-pay | Admitting: Cardiovascular Disease

## 2014-01-29 ENCOUNTER — Encounter: Payer: Self-pay | Admitting: *Deleted

## 2014-01-29 NOTE — Telephone Encounter (Signed)
Rx refill sent to patient pharmacy   

## 2014-01-30 ENCOUNTER — Telehealth (HOSPITAL_COMMUNITY): Payer: Self-pay

## 2014-01-30 NOTE — Telephone Encounter (Signed)
Encounter complete. 

## 2014-02-01 ENCOUNTER — Encounter (HOSPITAL_COMMUNITY): Payer: Medicare Other

## 2014-02-07 ENCOUNTER — Telehealth (HOSPITAL_COMMUNITY): Payer: Self-pay

## 2014-02-07 NOTE — Telephone Encounter (Signed)
Encounter complete. 

## 2014-02-09 ENCOUNTER — Ambulatory Visit (INDEPENDENT_AMBULATORY_CARE_PROVIDER_SITE_OTHER): Payer: Medicare Other

## 2014-02-09 ENCOUNTER — Ambulatory Visit (INDEPENDENT_AMBULATORY_CARE_PROVIDER_SITE_OTHER): Payer: Medicare Other | Admitting: Internal Medicine

## 2014-02-09 VITALS — BP 102/58 | HR 75 | Temp 98.4°F | Resp 20 | Ht 66.5 in | Wt 193.4 lb

## 2014-02-09 DIAGNOSIS — D72829 Elevated white blood cell count, unspecified: Secondary | ICD-10-CM

## 2014-02-09 DIAGNOSIS — R509 Fever, unspecified: Secondary | ICD-10-CM

## 2014-02-09 DIAGNOSIS — R05 Cough: Secondary | ICD-10-CM

## 2014-02-09 DIAGNOSIS — R0789 Other chest pain: Secondary | ICD-10-CM

## 2014-02-09 DIAGNOSIS — R059 Cough, unspecified: Secondary | ICD-10-CM

## 2014-02-09 LAB — POCT CBC
Granulocyte percent: 69.8 %G (ref 37–80)
HEMATOCRIT: 35.1 % — AB (ref 43.5–53.7)
Hemoglobin: 11.2 g/dL — AB (ref 14.1–18.1)
Lymph, poc: 2.9 (ref 0.6–3.4)
MCH: 30 pg (ref 27–31.2)
MCHC: 31.9 g/dL (ref 31.8–35.4)
MCV: 94 fL (ref 80–97)
MID (CBC): 1.2 — AB (ref 0–0.9)
MPV: 7.3 fL (ref 0–99.8)
POC Granulocyte: 9.4 — AB (ref 2–6.9)
POC LYMPH PERCENT: 21.5 %L (ref 10–50)
POC MID %: 8.7 %M (ref 0–12)
Platelet Count, POC: 244 10*3/uL (ref 142–424)
RBC: 3.73 M/uL — AB (ref 4.69–6.13)
RDW, POC: 14.9 %
WBC: 13.4 10*3/uL — AB (ref 4.6–10.2)

## 2014-02-09 MED ORDER — LEVOFLOXACIN 500 MG PO TABS: 500.0000 mg | ORAL_TABLET | Freq: Every day | ORAL | Status: DC

## 2014-02-09 NOTE — Progress Notes (Addendum)
Subjective:    Patient ID: Gregory [name], male    DOB: Nov 30, 1932, 80 y.o.   MRN: [id number] This chart was scribed for Tami Lin, MD by Girtha Hake, ED Scribe. The patient was seen in Room 10. The patient's care was started at 4:59 PM.   HPI HPI Comments: Gregory [name] is a 80 y.o. male who presents today complaining of generalized body aches and a cough beginning almost 1 week ago. The cough was occasionally productive especially at the outset. He had fever chills night sweats for 4 days. His appetite was diminished. His activity was decreased. He is better today but he feels  sore in his posterior chest especially with deep breathing and cough.  Patient denies leg swelling or shortness of breath. His wife has a similar illness.  He is followed by Dr. Claiborne Billings with chronic A. fib, cardiomyopathy with lowered ejection fraction, coronary artery disease status post CABG, anticoagulated on xarelto, and peripheral vascular disease. Other medications include Coreg, Lasix, lisinopril, potassium chloride and Zocor.  He has been surprisingly good recently from a cardiac standpoint He is not had a flu shot this fall and he is overdue for another Pneumovax according to his daughter   Review of Systems  Constitutional: Positive for fever, chills, diaphoresis, activity change and appetite change. Negative for unexpected weight change.  HENT: Negative for postnasal drip and sinus pressure.   Eyes: Negative for visual disturbance.  Respiratory: Positive for cough. Negative for shortness of breath and wheezing.   Cardiovascular: Negative for chest pain, palpitations and leg swelling.  Gastrointestinal: Negative for abdominal pain.  Genitourinary: Negative for dysuria, urgency and frequency.  Musculoskeletal: Positive for myalgias.  Neurological: Negative for headaches.  Psychiatric/Behavioral: Negative for confusion.       Objective:   Physical Exam  Constitutional: He is oriented to  person, place, and time. He appears well-developed and well-nourished. No distress.  HENT:  Right Ear: External ear normal.  Left Ear: External ear normal.  Nose: Nose normal.  Mouth/Throat: Oropharynx is clear and moist.  Nose and throat are clear.  Eyes: Conjunctivae and EOM are normal. Pupils are equal, round, and reactive to light.  Neck: Neck supple. No thyromegaly present.  Cardiovascular: Normal rate and intact distal pulses.   No murmur heard. Irregularly irregular rhythm  Pulmonary/Chest: Effort normal.  Crackles at both bases. Scattered rhonchi that improved with coughing No wheezing with forced expiration. He is tender along the lower ribs bilaterally posteriorly.   Abdominal: There is no tenderness. There is no rebound and no guarding.  Musculoskeletal: He exhibits no edema.  Lymphadenopathy:    He has no cervical adenopathy.  Neurological: He is alert and oriented to person, place, and time. No cranial nerve deficit.  Nursing note and vitals reviewed. BP 102/58 mmHg  Pulse 75  Temp(Src) 98.4 F (36.9 C) (Oral)  Resp 20  Ht 5' 6.5" (1.689 m)  Wt 193 lb 6.4 oz (87.726 kg)  BMI 30.75 kg/m2  SpO2 99%   [facility] reading (PRIMARY) by  Dr. Laney Pastor no active infiltrate. Cardiomegaly present   Results for orders placed or performed in visit on 02/09/14  POCT CBC  Result Value Ref Range   WBC 13.4 (A) 4.6 - 10.2 K/uL   Lymph, poc 2.9 0.6 - 3.4   POC LYMPH PERCENT 21.5 10 - 50 %L   MID (cbc) 1.2 (A) 0 - 0.9   POC MID % 8.7 0 - 12 %M   POC Granulocyte  9.4 (A) 2 - 6.9   Granulocyte percent 69.8 37 - 80 %G   RBC 3.73 (A) 4.69 - 6.13 M/uL   Hemoglobin 11.2 (A) 14.1 - 18.1 g/dL   HCT, POC 35.1 (A) 43.5 - 53.7 %   MCV 94.0 80 - 97 fL   MCH, POC 30.0 27 - 31.2 pg   MCHC 31.9 31.8 - 35.4 g/dL   RDW, POC 14.9 %   Platelet Count, POC 244 142 - 424 K/uL   MPV 7.3 0 - 99.8 fL        Assessment & Plan:  I have completed the patient encounter in its entirety as documented  by the scribe, with editing by me where necessary. Idara Woodside P. Laney Pastor, M.D.  Problem #1 febrile illness with a cough Problem #2 blood pressure slightly lower than usual Problem #3 leukocytosis  This is concerning for a flulike illness that is progressing toward pneumonic process since he has developed leukocytosis  normal  white blood cell count on 01/23/2014  He is advised of the need for close follow-up Start Levaquin 500 daily His daughter will push fluid intake Okay to use Tylenol for myalgias Follow-up at once for change in sensorium, shortness of breath, chest pain

## 2014-02-12 ENCOUNTER — Telehealth: Payer: Self-pay

## 2014-02-12 NOTE — Telephone Encounter (Signed)
-----   Message from Leandrew Koyanagi, MD sent at 02/10/2014 12:27 AM EST ----- Please call the home on Saturday and check on his well-being after starting on antibiotics

## 2014-02-13 ENCOUNTER — Ambulatory Visit (HOSPITAL_COMMUNITY)
Admission: RE | Admit: 2014-02-13 | Discharge: 2014-02-13 | Disposition: A | Discharge status: Home / Self Care | Payer: Medicare Other | Source: Ambulatory Visit | Attending: Cardiovascular Disease | Admitting: Cardiovascular Disease

## 2014-02-13 DIAGNOSIS — I252 Old myocardial infarction: Secondary | ICD-10-CM | POA: Insufficient documentation

## 2014-02-13 DIAGNOSIS — I10 Essential (primary) hypertension: Secondary | ICD-10-CM | POA: Insufficient documentation

## 2014-02-13 DIAGNOSIS — Z87891 Personal history of nicotine dependence: Secondary | ICD-10-CM | POA: Insufficient documentation

## 2014-02-13 DIAGNOSIS — I739 Peripheral vascular disease, unspecified: Secondary | ICD-10-CM | POA: Insufficient documentation

## 2014-02-13 DIAGNOSIS — E785 Hyperlipidemia, unspecified: Secondary | ICD-10-CM | POA: Insufficient documentation

## 2014-02-13 DIAGNOSIS — I2510 Atherosclerotic heart disease of native coronary artery without angina pectoris: Secondary | ICD-10-CM

## 2014-02-13 DIAGNOSIS — Z951 Presence of aortocoronary bypass graft: Secondary | ICD-10-CM | POA: Insufficient documentation

## 2014-02-13 DIAGNOSIS — Z8249 Family history of ischemic heart disease and other diseases of the circulatory system: Secondary | ICD-10-CM | POA: Diagnosis not present

## 2014-02-13 MED ORDER — REGADENOSON 0.4 MG/5ML IV SOLN
0.4000 mg | Freq: Once | INTRAVENOUS | Status: AC
  Administered 2014-02-13: 0.4 mg via INTRAVENOUS

## 2014-02-13 MED ORDER — TECHNETIUM TC 99M SESTAMIBI GENERIC - CARDIOLITE
10.8000 | Freq: Once | INTRAVENOUS | Status: AC | PRN
  Administered 2014-02-13: 11 via INTRAVENOUS

## 2014-02-13 MED ORDER — TECHNETIUM TC 99M SESTAMIBI GENERIC - CARDIOLITE
30.5000 | Freq: Once | INTRAVENOUS | Status: AC | PRN
  Administered 2014-02-13: 31 via INTRAVENOUS

## 2014-02-13 NOTE — Procedures (Addendum)
[facility] [address] [place] [address] [id number]  Cardiology Nuclear Med Study  Gregory [name] is a 80 y.o. male     MRN : [id number]     DOB: 05/18/1932  Procedure Date: 02/13/2014  Nuclear Med Background Indication for Stress Test:  Graft Patency History:  Prior MI, prior CABG Cardiac Risk Factors: Carotid Disease, CVA, Family History - CAD, History of Smoking, Hypertension, Lipids and PVD  Symptoms:  none   Nuclear Pre-Procedure Caffeine/Decaff Intake:  1:00am NPO After: 11:00am   IV Site: R Antecubital  IV 0.9% NS with Angio Cath:  22g  Chest Size (in):  42 IV Started by: Otho Perl, CNMT  Height: 5\' 10"  (1.778 m)  Cup Size: n/a  BMI:  Body mass index is 28.55 kg/(m^2). Weight:  199 lb (90.266 kg)   Tech Comments:  n/a    Nuclear Med Study 1 or 2 day study: 1 day  Stress Test Type:  [place] Provider:  Shelva Majestic, MD   Resting Radionuclide: Technetium 51m Sestamibi  Resting Radionuclide Dose: 10.8 mCi   Stress Radionuclide:  Technetium 23m Sestamibi  Stress Radionuclide Dose: 30.5 mCi           Stress Protocol Rest HR: 73 Stress HR: 75  Rest BP: 135/84 Stress BP: 145/78  Exercise Time (min): n/a METS: n/a   Predicted Max HR: 141 bpm % Max HR: 56.03 bpm Rate Pressure Product: 11455  Dose of Adenosine (mg):  n/a Dose of Lexiscan: 0.4 mg  Dose of Atropine (mg): n/a Dose of Dobutamine: n/a mcg/kg/min (at max HR)  Stress Test Technologist: Leane Para, CCT Nuclear Technologist: Imagene Riches, CNMT   Rest Procedure:  Myocardial perfusion imaging was performed at rest 45 minutes following the intravenous administration of Technetium 60m Sestamibi. Stress Procedure:  The patient received IV Lexiscan 0.4 mg over 15-seconds.  Technetium 45m Sestamibi injected IV at 30-seconds.  There were no significant changes with Lexiscan.  Quantitative spect images were obtained after a 45  minute delay.  Transient Ischemic Dilatation (Normal <1.22):  1.09  QGS EDV:  n/a ml QGS ESV:  n/a ml LV Ejection Fraction: Study not gated       Rest ECG: Atrial Fibrilliation  Stress ECG: No significant change from baseline ECG  QPS Raw Data Images:  Normal; no motion artifact; normal heart/lung ratio. Stress Images:  There is decreased uptake in the apex. Rest Images:  There is decreased uptake in the apex. Subtraction (SDS):  No evidence of ischemia.  Impression Exercise Capacity:  Lexiscan with no exercise. BP Response:  Normal blood pressure response. Clinical Symptoms:  No significant symptoms noted. ECG Impression:  No significant ST segment change suggestive of ischemia. Comparison with Prior Nuclear Study: No significant change from previous study  Overall Impression:  Low risk stress nuclear study Apical thinning.  LV Wall Motion:  Gating not performed secondary to afib and PVCs   [name], MD  02/13/2014 5:16 PM

## 2014-02-14 NOTE — Telephone Encounter (Signed)
Lm for rtn call 

## 2014-02-15 NOTE — Telephone Encounter (Signed)
Spoke to pt he is feeling much better. He is not having any pain at all. Advised to give [place] a call if he needs anything.

## 2014-02-21 ENCOUNTER — Encounter: Payer: Self-pay | Admitting: *Deleted

## 2014-02-22 ENCOUNTER — Encounter (HOSPITAL_COMMUNITY): Payer: Self-pay | Admitting: Cardiovascular Disease

## 2014-03-01 ENCOUNTER — Telehealth: Payer: Self-pay

## 2014-03-01 NOTE — Telephone Encounter (Signed)
Called patient to remind him to have his flu shot, but there was no answer.

## 2014-04-05 ENCOUNTER — Other Ambulatory Visit: Payer: Self-pay | Admitting: Cardiovascular Disease

## 2014-04-05 NOTE — Telephone Encounter (Signed)
Rx has been sent to the pharmacy electronically. ° °

## 2014-05-11 ENCOUNTER — Telehealth: Payer: Self-pay | Admitting: Cardiovascular Disease

## 2014-05-11 ENCOUNTER — Other Ambulatory Visit: Payer: Self-pay | Admitting: Cardiovascular Disease

## 2014-05-11 NOTE — Telephone Encounter (Signed)
Patient's daughter [name] came to office picked up a Xarelto savings card.

## 2014-05-11 NOTE — Telephone Encounter (Signed)
Cynthia(daughter) is calling because Gregory [name] needs to have something sent to the pharmacy to get a discount on his Xarelto . Please call   Thanks

## 2014-05-11 NOTE — Telephone Encounter (Signed)
Returned call to patient's daughter [name] no answer.Gabbs.

## 2014-07-10 ENCOUNTER — Encounter: Payer: Self-pay | Admitting: *Deleted

## 2014-07-16 ENCOUNTER — Telehealth: Payer: Self-pay | Admitting: *Deleted

## 2014-07-16 NOTE — Telephone Encounter (Signed)
Daughter returned my call & we were able to schedule his annual wellness visit with Dr. Lorelei Pont (whom daughter states should have already been listed as patient's PCP - changed in care team section).

## 2014-07-16 NOTE — Telephone Encounter (Signed)
Returned daughter's call in response to my letter to schedule patient's annual wellness visit. [place]

## 2014-07-23 ENCOUNTER — Telehealth: Payer: Self-pay | Admitting: Cardiovascular Disease

## 2014-07-23 NOTE — Telephone Encounter (Signed)
°  1. Which medications need to be refilled? Xarelto and Potassium  2. Which pharmacy is medication to be sent to?[facility]-[phone]  3. Do they need a 30 day or 90 day supply? 30 and refills  4. Would they like a call back once the medication has been sent to the pharmacy? yes

## 2014-07-23 NOTE — Telephone Encounter (Signed)
Refill submitted to patient's preferred pharmacy. Informed patient. Pt voiced understanding, no other stated concerns at this time.  

## 2014-07-26 ENCOUNTER — Encounter: Payer: Self-pay | Admitting: Cardiovascular Disease

## 2014-07-26 ENCOUNTER — Ambulatory Visit (INDEPENDENT_AMBULATORY_CARE_PROVIDER_SITE_OTHER): Payer: Medicare Other | Admitting: Cardiovascular Disease

## 2014-07-26 VITALS — BP 122/80 | HR 72 | Ht 67.0 in | Wt 198.7 lb

## 2014-07-26 DIAGNOSIS — I6523 Occlusion and stenosis of bilateral carotid arteries: Secondary | ICD-10-CM | POA: Diagnosis not present

## 2014-07-26 DIAGNOSIS — I2510 Atherosclerotic heart disease of native coronary artery without angina pectoris: Secondary | ICD-10-CM | POA: Diagnosis not present

## 2014-07-26 DIAGNOSIS — Z7901 Long term (current) use of anticoagulants: Secondary | ICD-10-CM

## 2014-07-26 DIAGNOSIS — I739 Peripheral vascular disease, unspecified: Secondary | ICD-10-CM | POA: Diagnosis not present

## 2014-07-26 DIAGNOSIS — I2583 Coronary atherosclerosis due to lipid rich plaque: Secondary | ICD-10-CM

## 2014-07-26 DIAGNOSIS — E785 Hyperlipidemia, unspecified: Secondary | ICD-10-CM | POA: Diagnosis not present

## 2014-07-26 DIAGNOSIS — Z79899 Other long term (current) drug therapy: Secondary | ICD-10-CM | POA: Diagnosis not present

## 2014-07-26 DIAGNOSIS — I482 Chronic atrial fibrillation, unspecified: Secondary | ICD-10-CM

## 2014-07-26 MED ORDER — LISINOPRIL 10 MG PO TABS: 10.0000 mg | ORAL_TABLET | Freq: Every day | ORAL | Status: DC

## 2014-07-26 MED ORDER — POTASSIUM CHLORIDE CRYS ER 20 MEQ PO TBCR: 20.0000 meq | EXTENDED_RELEASE_TABLET | Freq: Every day | ORAL | Status: DC

## 2014-07-26 MED ORDER — FUROSEMIDE 40 MG PO TABS: 40.0000 mg | ORAL_TABLET | Freq: Every day | ORAL | Status: DC

## 2014-07-26 MED ORDER — CARVEDILOL 3.125 MG PO TABS: ORAL_TABLET | ORAL | Status: DC

## 2014-07-26 MED ORDER — SIMVASTATIN 40 MG PO TABS: 40.0000 mg | ORAL_TABLET | Freq: Every day | ORAL | Status: DC

## 2014-07-26 MED ORDER — RIVAROXABAN 20 MG PO TABS: ORAL_TABLET | ORAL | Status: DC

## 2014-07-26 NOTE — Patient Instructions (Signed)
Your physician recommends that you return for lab work today.  Your physician has requested that you have a carotid duplex. This test is an ultrasound of the carotid arteries in your neck. It looks at blood flow through these arteries that supply the brain with blood. Allow one hour for this exam. There are no restrictions or special instructions.   Your physician recommends that you schedule a follow-up appointment in: 3 months

## 2014-07-27 ENCOUNTER — Other Ambulatory Visit: Payer: Self-pay

## 2014-07-27 ENCOUNTER — Encounter: Payer: Self-pay | Admitting: Cardiovascular Disease

## 2014-07-27 DIAGNOSIS — E875 Hyperkalemia: Secondary | ICD-10-CM

## 2014-07-27 LAB — LIPID PANEL
CHOL/HDL RATIO: 3.7 ratio
Cholesterol: 167 mg/dL (ref 0–200)
HDL: 45 mg/dL (ref >=40)
LDL CALC: 106 mg/dL — AB (ref 0–99)
Triglycerides: 82 mg/dL (ref <150)
VLDL: 16 mg/dL (ref 0–40)

## 2014-07-27 LAB — CBC
HEMATOCRIT: 36.4 % — AB (ref 39.0–52.0)
Hemoglobin: 12.2 g/dL — ABNORMAL LOW (ref 13.0–17.0)
MCH: 29.6 pg (ref 26.0–34.0)
MCHC: 33.5 g/dL (ref 30.0–36.0)
MCV: 88.3 fL (ref 78.0–100.0)
MPV: 9.8 fL (ref 8.6–12.4)
Platelets: 240 10*3/uL (ref 150–400)
RBC: 4.12 MIL/uL — AB (ref 4.22–5.81)
RDW: 14.8 % (ref 11.5–15.5)
WBC: 7.4 10*3/uL (ref 4.0–10.5)

## 2014-07-27 LAB — COMPREHENSIVE METABOLIC PANEL
ALK PHOS: 82 U/L (ref 39–117)
ALT: 12 U/L (ref 0–53)
AST: 19 U/L (ref 0–37)
Albumin: 4 g/dL (ref 3.5–5.2)
BILIRUBIN TOTAL: 0.7 mg/dL (ref 0.2–1.2)
BUN: 27 mg/dL — ABNORMAL HIGH (ref 6–23)
CO2: 26 meq/L (ref 19–32)
CREATININE: 1.53 mg/dL — AB (ref 0.50–1.35)
Calcium: 9.3 mg/dL (ref 8.4–10.5)
Chloride: 108 mEq/L (ref 96–112)
GLUCOSE: 93 mg/dL (ref 70–99)
Potassium: 5.8 mEq/L — ABNORMAL HIGH (ref 3.5–5.3)
Sodium: 142 mEq/L (ref 135–145)
Total Protein: 7.3 g/dL (ref 6.0–8.3)

## 2014-07-27 NOTE — Progress Notes (Signed)
Patient ID: Gregory [name], male   DOB: 10-14-1932, 81 y.o.   MRN: [id number] \     HPI: Gregory [name] is a 81 y.o. male returns for a 7 month followup cardiology evaluation.  He is a former patient of Dr. Rollene Fare.    Mr. Wesche has a history of known CAD in 2003 underwent CABG revascularization surgery by Dr. Servando Snare  (LIMA  to  LAD, vein graft to the obtuse marginal, sequential vein graft to the PDA and PLA of RCA).  His last cardiac catheterization was in 2008 done by Dr. Tami Ribas. At that time, ejection fraction was 20-25%. He declined consideration for ICD. Subsequent ejection fraction improved to approximately 35-40% in September 2012 on medical therapy. In the past, he has refused Coumadin therapy and had been maintained on aspirin and Plavix. In 2013 he was hospitalized for appendicitis with abscess requiring laparoscopic appendectomy. He was hospitalized in November 2014 and was felt to have a splenic infarct of embolic etiology. He underwent a transesophageal echocardiogram on 01/23/2013 by Dr. Sallyanne Kuster which revealed an ejection fraction in the range of 15-25%. There was diffuse hypokinesis with severe hypokinesis to akinesis of the anteroseptal apical myocardium. He had increased left ventricular end-diastolic filling pressure and elevated left atrial filling pressure. There was evidence for large solid fixed thrombus occupying the entire atrial appendage with moderate continuous spontaneous echo contrast ("smoke") in the left atrial cavity. There was no evidence for right atrial thrombus. At that time, the patient refused life vest.  He has been on Xarelto for anticoagulation.He denies chest pain. He denies significant shortness of breath. He is unaware of tachdysrhythmias.   He has permanent atrial fibrillation, but his rate has been controlled.  He denies any presyncope or syncope.  He denies any chest pain.  A followup echo Doppler study on 06/14/2013 revealed significant improvement in  LV function with an ejection fraction estimated now at 50-55% without regional wall motion abnormalities.  He did have mild aortic root dilatation, mild aortic insufficiency, and mild mitral regurgitation with biatrial enlargement.    He has severe bilateral carotid artery disease.  Mostly he had been seen by Dr. Oneida Alar.  His last carotid duplex scan was significantly abnormal with his left carotid velocities increased to 883 systolic and 254 diastolic with severe fibrous plaque and elevated velocities within all segments of his left internal carotid.  There also was 50-69% diameter reduction in the right bulb/proximal ICA. I referred him to Dr. Gwenlyn Found her subsequent referred him to Dr. Trula Slade.  He denies chest pain.  He denies palpitations.  He denies shortness of breath.  He continues to say he is asymptomatic with reference to his carotid study, "so why should I get it fixed."  He presents for evaluation.  Past Medical History  Diagnosis Date  . Coronary artery disease   . CAD (coronary artery disease),CABG 1993-LIMA to the LAD, SVG to Om and SVG to PDA/PLA, patent on cath 2008. 2003  . CHF (congestive heart failure)   . Heart murmur   . Atrial fibrillation     permanent  . Hypertension   . High cholesterol   . Heart attack 1980's  . Pneumonia     "in the last 5 yrs" (01/22/2013)  . GERD (gastroesophageal reflux disease)   . Stroke     "slightly drags left foot since; recovered qthing else" (01/21/2013)  . Kidney stones     "passed all but one time when he had to have lithotripsy" (  01/21/2013)  . Cardiomyopathy 2008    EF 20% but now improved to 40-45%  . 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 SSS (sick sinus syndrome)   . Pulmonary hypertension   . Carotid artery disease     Past Surgical History  Procedure Laterality Date  . Laparoscopic appendectomy  03/30/2011    Procedure: APPENDECTOMY LAPAROSCOPIC;  Surgeon: Joyice Faster. Cornett, MD;  Location: [facility];  Service: General;  Laterality: N/A;  .  Appendectomy    . Eye surgery    . Cataract extraction w/ intraocular lens implant Bilateral 2013  . Esophagogastroduodenoscopy  02/01/2012    Procedure: ESOPHAGOGASTRODUODENOSCOPY (EGD);  Surgeon: Beryle Beams, MD;  Location: [facility];  Service: Endoscopy;  Laterality: N/A;  . Coronary artery bypass graft  03/01/2002    LIMA to LAD, reverse SVG to OM, reverse SVG to PDA of RCA, reverse SVG to PLA of RCA, ligation of LA appendage (Dr. Servando Snare)  . Lithotripsy      "once" (01/21/2013)  . Tee without cardioversion N/A 01/23/2013    Procedure: TRANSESOPHAGEAL ECHOCARDIOGRAM (TEE);  Surgeon: Sanda Klein, MD;  Location: [facility] ENDOSCOPY;  Service: Cardiovascular;  Laterality: N/A;  . Transthoracic echocardiogram  06/23/2012    EF 40-45%, mild LVH; mild AV regurg; mild MV regurg; LV mod-severely dilated; RV mildly dilated; systolic pressure borderline increased; RA mod dilated  . Nm myocar perf wall motion  05/2009    persantine myoview - fixed moderate perfusion defect in inferior wall & lateral segment of apex (poor non-transmural infarction), minimal anterolateral periinfarct reversible ischemia seen, abnormal study, defects similar to 2006 study  . Coronary angioplasty  07/05/2006    3 vessel CAD, patent LIMA to LAD, patentVG to OM, patent SVG to PDA & PLA, mild MR, severe LV systolic dysfunction (Dr. Gerrie Nordmann)  . Cardiac catheterization  02/24/2002    reduced LV function, 60-70% prox RCA stenosis, 70% PLA ostial stenosis, 80% secondary branch of PLA stenosis - subsequent CABG (Dr. Jackie Plum)  . Carotid doppler  06/2012    50-69% right bulb/prox ICA diameter reduction; 70-99% left bulb/prox ICA diameter reduction  . Splenectomy, total  01/2013  . Left heart catheterization with coronary angiogram N/A 01/24/2013    Procedure: LEFT HEART CATHETERIZATION WITH CORONARY ANGIOGRAM;  Surgeon: [name], MD;  Location: [facility] CATH LAB;  Service: Cardiovascular;  Laterality: N/A;  Right heart with  grafts    Allergies  Allergen Reactions  . Percocet [Oxycodone-Acetaminophen] Itching and Nausea Only    Current Outpatient Prescriptions  Medication Sig Dispense Refill  . aspirin EC 81 MG tablet Take 81 mg by mouth daily.    . carvedilol (COREG) 3.125 MG tablet TAKE 3 TABLETS BY MOUTH TWICE DAILY WITH A MEAL 180 tablet 9  . furosemide (LASIX) 40 MG tablet Take 1 tablet (40 mg total) by mouth daily. 30 tablet 6  . lisinopril (PRINIVIL,ZESTRIL) 10 MG tablet Take 1 tablet (10 mg total) by mouth daily. 30 tablet 6  . pantoprazole (PROTONIX) 40 MG tablet TAKE 1 TABLET BY MOUTH EVERY DAY 90 tablet 2  . potassium chloride SA (K-DUR,KLOR-CON) 20 MEQ tablet Take 1 tablet (20 mEq total) by mouth daily. 30 tablet 6  . rivaroxaban (XARELTO) 20 MG TABS tablet TAKE 1 TABLET BY MOUTH EVERY DAY BEFORE SUPPER 30 tablet 6  . simvastatin (ZOCOR) 40 MG tablet Take 1 tablet (40 mg total) by mouth at bedtime. 30 tablet 6   No current facility-administered medications for this visit.  History   Social History  . Marital Status: Married    Spouse Name: N/A  . Number of Children: 8  . Years of Education: 8   Occupational History  . Not on file.   Social History Main Topics  . Smoking status: Former Smoker -- 2.00 packs/day for 20 years    Types: Cigarettes    Quit date: 04/08/1986  . Smokeless tobacco: Current User    Types: Chew  . Alcohol Use: No  . Drug Use: No  . Sexual Activity: Not on file   Other Topics Concern  . Not on file   Social History Narrative    Family History  Problem Relation Age of Onset  . Heart disease Father     rheumatic fever  . Heart attack Brother 77  . Heart disease Daughter   . Stroke Mother   . Stroke Brother 68   ROS General: Negative; No fevers, chills, or night sweats;  HEENT: Negative; No changes in vision or hearing, sinus congestion, difficulty swallowing Pulmonary: Negative; No cough, wheezing, shortness of breath,  hemoptysis Cardiovascular: See history of present illness GI: positive for GERD; No nausea, vomiting, diarrhea, or abdominal pain GU: Negative; No dysuria, hematuria, or difficulty voiding Musculoskeletal: Negative; no myalgias, joint pain, or weakness Hematologic/Oncology: Negative; no easy bruising, bleeding Endocrine: Negative; no heat/cold intolerance; no diabetes Neuro: positive for remote CVA Skin: Negative; No rashes or skin lesions Psychiatric: Negative; No behavioral problems, depression Sleep: Negative; No snoring, daytime sleepiness, hypersomnolence, bruxism, restless legs, hypnogognic hallucinations, no cataplexy Other comprehensive 14 point system review is negative.   PE BP 122/80 mmHg  Pulse 72  Ht _0  (1.702 m)  Wt 198 lb 11.2 oz (90.13 kg)  BMI 31.11 kg/m2  General: Alert, oriented, no distress.  Full bearded. Skin: normal turgor, no rashes HEENT: Normocephalic, atraumatic. Pupils round and reactive; sclera anicteric;no lid lag.  Nose without nasal septal hypertrophy Mouth/Parynx benign; Mallinpatti scale 3 Neck: No JVD, bilateral carotid bruits with slightly delayed upstroke. Lungs: clear to ausculatation and percussion; no wheezing or rales Chest wall: no tenderness to palpitation Heart: Irregularly irregular rhythm compatible with atrial fibrillation with a controlled ventricular response, s1 s2 normal 2/6 systolic murmur. No S3 gallop.  No diastolic murmur.  No rubs, thrills or heaves to Abdomen: soft, nontender; no hepatosplenomehaly, BS+; abdominal aorta nontender and not dilated by palpation. Back: no CVA tenderness Pulses 2+ Extremities: no clubbing cyanosis or edema, Homan's sign negative  Neurologic: grossly nonfocal Psychologic: normal affect and mood.  ECG (independently read by me): Atrial fibrillation with occasional PVCs.  Heart rate 72.  ECG (independently read by me): Atrial fibrillation with controlled ventricular response in the 70s.  Poor  anterior R-wave progression.  Prior April 2015 ECG (independently read by me): Atrial fibrillation at 56 beats per minute.  QTc interval 395 ms  Prior 02/23/2013 ECG: Atrial fibrillation at 80 beats per minute with isolated PVC.  LABS: BMP Latest Ref Rng 07/26/2014 01/23/2014 07/13/2013  Glucose 70 - 99 mg/dL 93 89 89  BUN 6 - 23 mg/dL 27(H) 26(H) 18  Creatinine 0.50 - 1.35 mg/dL 1.53(H) 1.22 1.12  Sodium 135 - 145 mEq/L 142 143 143  Potassium 3.5 - 5.3 mEq/L 5.8(H) 4.9 4.4  Chloride 96 - 112 mEq/L 108 108 107  CO2 19 - 32 mEq/L _1 Calcium 8.4 - 10.5 mg/dL 9.3 8.9 9.0   Hepatic Function Latest Ref Rng 07/26/2014 01/23/2014 07/13/2013  Total Protein 6.0 -  8.3 g/dL 7.3 6.5 6.5  Albumin 3.5 - 5.2 g/dL 4.0 3.6 3.8  AST 0 - 37 U/L _0 ALT 0 - 53 U/L _1 Alk Phosphatase 39 - 117 U/L 82 62 64  Total Bilirubin 0.2 - 1.2 mg/dL 0.7 0.5 0.6  Bilirubin, Direct 0.0 - 0.3 mg/dL - - -   CBC Latest Ref Rng 07/26/2014 02/09/2014 01/23/2014  WBC 4.0 - 10.5 K/uL 7.4 13.4(A) 6.2  Hemoglobin 13.0 - 17.0 g/dL 12.2(L) 11.2(A) 11.4(L)  Hematocrit 39.0 - 52.0 % 36.4(L) 35.1(A) 33.3(L)  Platelets 150 - 400 K/uL 240 - 237   Lab Results  Component Value Date   MCV 88.3 07/26/2014   MCV 94.0 02/09/2014   MCV 89.8 01/23/2014   Lab Results  Component Value Date   TSH 4.403 01/23/2014   Lab Results  Component Value Date   HGBA1C 6.0* 07/13/2013   Lipid Panel     Component Value Date/Time   CHOL 167 07/26/2014 1616   TRIG 82 07/26/2014 1616   HDL 45 07/26/2014 1616   CHOLHDL 3.7 07/26/2014 1616   VLDL 16 07/26/2014 1616   LDLCALC 106* 07/26/2014 1616    ASSESSMENT AND PLAN: [name] is a 81 year old gentleman who is status post CABG revascularization surgery in 2003. He has a history of an  ischemic cardiomyopathy with an initial ejection fraction of 25% with subsequent improvement to approximately 35-40%.  He has a remote history of a small right brain CVA in 2005  with good with subsequent recovery. 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 He has documented extensive thrombus in his left atrium. He now is on anticoagulation therapy which needs to be lifelong.  His blood pressure today is stable at 122/80.  He is tolerating ACE inhibition with lisinopril 10 mg and beta blocker therapy, currently taking carvedilol 9.375 mg twice a day. In the past he had refused an ICD.  His last echo suggested significant improvement in LV function.  He has severe carotid occlusive disease.  I spent a long time with him today discussing the potential reduction of stroke risk with treatment for his high-grade stenosis.  I mentioned to him the fact that he had undergone CABG revascularization surgery may have significantly reduced his future heart attack risk.  If he had not had his surgery.  He has agreed to undergo a follow-up duplex scan and evaluation of his carotid stenosis but he still believes he is asymptomatic may not require treatment.  He has hyperlipidemia and is on simvastatin 40 mg daily.  Target LDL is less than 70.  He was fasting today and went down to the laboratory for laboratory.  Since his LDL is not at target, will contact him regarding either the addition of Zetia, or changing him to 40 mg Crestor or 80 mg atorvastatin.  His permanent atrial fibrillation is well controlled.  He is on Xarelto anticoagulation without bleeding side effects.  Will contact him regarding his Doppler studies and further conditions will be made at that time.  Troy Sine, MD, [facility]  07/27/2014 9:53 AM

## 2014-07-30 DIAGNOSIS — E875 Hyperkalemia: Secondary | ICD-10-CM | POA: Diagnosis not present

## 2014-07-30 LAB — BASIC METABOLIC PANEL
BUN: 25 mg/dL — ABNORMAL HIGH (ref 6–23)
CHLORIDE: 105 meq/L (ref 96–112)
CO2: 27 meq/L (ref 19–32)
Calcium: 8.8 mg/dL (ref 8.4–10.5)
Creat: 1.6 mg/dL — ABNORMAL HIGH (ref 0.50–1.35)
Glucose, Bld: 107 mg/dL — ABNORMAL HIGH (ref 70–99)
POTASSIUM: 4.8 meq/L (ref 3.5–5.3)
SODIUM: 142 meq/L (ref 135–145)

## 2014-08-01 ENCOUNTER — Telehealth: Payer: Self-pay | Admitting: *Deleted

## 2014-08-01 NOTE — Telephone Encounter (Signed)
-----  Message from Troy Sine, MD sent at 07/31/2014 11:55 AM EDT ----- Decrease lasix to 20 mg from 40 mg; re check B met in 2 weeks , if Cr not better then will decrease lisinopril to 5 mg from 10 mg.

## 2014-08-02 ENCOUNTER — Telehealth: Payer: Self-pay | Admitting: Cardiovascular Disease

## 2014-08-02 DIAGNOSIS — Z79899 Other long term (current) drug therapy: Secondary | ICD-10-CM

## 2014-08-02 DIAGNOSIS — R899 Unspecified abnormal finding in specimens from other organs, systems and tissues: Secondary | ICD-10-CM

## 2014-08-02 MED ORDER — FUROSEMIDE 40 MG PO TABS: 20.0000 mg | ORAL_TABLET | Freq: Every day | ORAL | Status: DC

## 2014-08-02 NOTE — Telephone Encounter (Signed)
Pt is calling back to get the results to his labs. Please f/u  Thanks

## 2014-08-02 NOTE — Telephone Encounter (Signed)
Pt was taken off his Potassium last week.He wants to know if he needs to start back taking it or stay off of it?

## 2014-08-02 NOTE — Telephone Encounter (Signed)
Called and notified patient of lab results and recommendations. Patient voiced verbal understanding.of instructions. He went and got furosemide bottle and read off to me the label to confirm that he had the correct medication.

## 2014-08-02 NOTE — Telephone Encounter (Signed)
-----  Message from Troy Sine, MD sent at 07/31/2014 11:55 AM EDT ----- Decrease lasix to 20 mg from 40 mg; re check B met in 2 weeks , if Cr not better then will decrease lisinopril to 5 mg from 10 mg.

## 2014-08-02 NOTE — Telephone Encounter (Signed)
Pt of Dr. Kelly's. Per caller, pt was instructed to stop potassium d/t serum elevation.  Wants to know if he should resume.  His furosemide dose was recently cut - he is aware of the new instructions. Also aware Dr. Kelly wanted BMET redraw in 2 weeks:  Notes Recorded by Thomas A Kelly, MD on 07/31/2014 at 11:55 AM Decrease lasix to 20 mg from 40 mg; re check B met in 2 weeks , if Cr not better then will decrease lisinopril to 5 mg from 10 mg.       Ref Range 3d ago  7d ago  6mo ago  1yr ago     Sodium 135 - 145 mEq/L 142 142 143 143    Potassium 3.5 - 5.3 mEq/L 4.8 5.8 (H)CM 4.9 4.4    Chloride 96 - 112 mEq/L 105 108 108 107    CO2 19 - 32 mEq/L 27 26 27 28    Glucose, Bld 70 - 99 mg/dL 107 (H) 93 89 89    BUN 6 - 23 mg/dL 25 (H) 27 (H) 26 (H) 18    Creat 0.50 - 1.35 mg/dL 1.60 (H) 1.53 (H) 1.22 1.12    Calcium 8.4 - 10.5 mg/dL 8.8 9.3 8.9 9.0   Resulting Agency  [facility]    Narrative          There are not clear instructions on what to do with Potassium, whether to resume or not - I advised to continue to hold for now. Will route to Dr. Croitoru ([facility]). If different advice, will call pt w/ instructions. 

## 2014-08-02 NOTE — Telephone Encounter (Signed)
Continue to hold potassium. Recheck in another 2 weeks

## 2014-08-03 NOTE — Telephone Encounter (Signed)
Instructed patient on Dr. Victorino December recommendation. Understanding verbalized.

## 2014-08-16 DIAGNOSIS — Z79899 Other long term (current) drug therapy: Secondary | ICD-10-CM | POA: Diagnosis not present

## 2014-08-16 DIAGNOSIS — R899 Unspecified abnormal finding in specimens from other organs, systems and tissues: Secondary | ICD-10-CM | POA: Diagnosis not present

## 2014-08-17 LAB — BASIC METABOLIC PANEL
BUN: 28 mg/dL — ABNORMAL HIGH (ref 6–23)
CALCIUM: 8.6 mg/dL (ref 8.4–10.5)
CO2: 24 meq/L (ref 19–32)
CREATININE: 1.47 mg/dL — AB (ref 0.50–1.35)
Chloride: 109 mEq/L (ref 96–112)
Glucose, Bld: 81 mg/dL (ref 70–99)
Potassium: 4.6 mEq/L (ref 3.5–5.3)
SODIUM: 145 meq/L (ref 135–145)

## 2014-08-20 ENCOUNTER — Other Ambulatory Visit: Payer: Self-pay | Admitting: Cardiovascular Disease

## 2014-08-20 NOTE — Telephone Encounter (Signed)
Rx(s) sent to pharmacy electronically.  

## 2014-08-21 ENCOUNTER — Encounter: Payer: Self-pay | Admitting: *Deleted

## 2014-08-23 ENCOUNTER — Ambulatory Visit (HOSPITAL_COMMUNITY)
Admission: RE | Admit: 2014-08-23 | Discharge: 2014-08-23 | Disposition: A | Discharge status: Home / Self Care | Payer: Medicare Other | Source: Ambulatory Visit | Attending: Cardiovascular Disease | Admitting: Cardiovascular Disease

## 2014-08-23 DIAGNOSIS — I6523 Occlusion and stenosis of bilateral carotid arteries: Secondary | ICD-10-CM | POA: Insufficient documentation

## 2014-09-04 ENCOUNTER — Telehealth: Payer: Self-pay | Admitting: Cardiovascular Disease

## 2014-09-04 NOTE — Telephone Encounter (Signed)
Gregory [name](daughter) is calling to find out if Gregory [name] needs to resume taking his potassium and whether he will continue with taking a half of his lasik or go back to taking a whole one . He was given these instructions to stop taking the potassium and take half of lasik until he had the lab work and he had the lab work done 2wks ago. Please call .Marland Kitchen    Thanks

## 2014-09-04 NOTE — Telephone Encounter (Signed)
Returned call to patient's daughter Gregory [name] she stated she wanted to know if father needed to start back potassium.Stated he had lab work done 2 weeks ago and never heard results.Gregory [name] mailed results.08/16/14 Bmet renal functions improved,potassium normal.Advised to continue Lasix 20 mg daily and no potassium.I will send message to [facility] for advice.

## 2014-09-06 NOTE — Telephone Encounter (Signed)
Returned call to patient's daughter [name].Left message on personal voice mail Dr.Kelly advised continue Lasix 20 mg daily and no potassium.Advised to keep appointment with [facility] 10/24/14 at 3:15 pm.Advised to call sooner if needed.

## 2014-09-06 NOTE — Telephone Encounter (Signed)
agree

## 2014-09-18 ENCOUNTER — Encounter: Payer: Self-pay | Admitting: *Deleted

## 2014-10-22 ENCOUNTER — Encounter: Payer: Self-pay | Admitting: Family Medicine

## 2014-10-22 ENCOUNTER — Ambulatory Visit (INDEPENDENT_AMBULATORY_CARE_PROVIDER_SITE_OTHER): Payer: Medicare Other | Admitting: Family Medicine

## 2014-10-22 ENCOUNTER — Ambulatory Visit (INDEPENDENT_AMBULATORY_CARE_PROVIDER_SITE_OTHER): Payer: Medicare Other

## 2014-10-22 VITALS — BP 126/74 | HR 56 | Temp 97.8°F | Resp 16 | Ht 68.5 in | Wt 198.2 lb

## 2014-10-22 DIAGNOSIS — Z0000 Encounter for general adult medical examination without abnormal findings: Secondary | ICD-10-CM | POA: Diagnosis not present

## 2014-10-22 DIAGNOSIS — Z23 Encounter for immunization: Secondary | ICD-10-CM

## 2014-10-22 DIAGNOSIS — M25559 Pain in unspecified hip: Secondary | ICD-10-CM

## 2014-10-22 DIAGNOSIS — Z5181 Encounter for therapeutic drug level monitoring: Secondary | ICD-10-CM | POA: Diagnosis not present

## 2014-10-22 LAB — BASIC METABOLIC PANEL
BUN: 16 mg/dL (ref 7–25)
CHLORIDE: 105 mmol/L (ref 98–110)
CO2: 28 mmol/L (ref 20–31)
CREATININE: 1.11 mg/dL (ref 0.70–1.11)
Calcium: 8.6 mg/dL (ref 8.6–10.3)
Glucose, Bld: 90 mg/dL (ref 65–99)
POTASSIUM: 4.3 mmol/L (ref 3.5–5.3)
SODIUM: 142 mmol/L (ref 135–146)

## 2014-10-22 NOTE — Patient Instructions (Signed)
Your hip joints actually look good- it seems that your pain is likely coming from your lower back and pelvis Tylenol is a great choice for you as it is safe to use with your other medications You got your last pneumonia vaccine today We will be in touch with your labs asap Otherwise it looks like you are doing well- continue to see your heart doctor as usual

## 2014-10-22 NOTE — Progress Notes (Signed)
[facility] [address],  [place] [address] [phone]  Date:  10/22/2014   Name:  Gregory [name]   DOB:  Oct 22, 1932   MRN:  [id number]  PCP:  Lamar Blinks, MD    Chief Complaint: No chief complaint on file.   History of Present Illness:  Gregory [name] is a 81 y.o. very pleasant male patient who presents with the following:  Pt here for CPE- he is a pt of Dr. Claiborne Billings for his cardiac concerns, distant history of CABG and on xarelto for chronic a fib History of pre-diabetes only per chart. He is to follow up with Dr. Claiborne Billings on 8/10 for 6 month follow up. He receives all of his cardiac medications from there.  Needs his prevnar vaccine, does not want a colonoscopy Does not drink alcohol nor smoke, quit both in 1988 Does not need med refills   He has no complaints today other than low back and hip pain. Going on for years but keeps getting worse. Located in both posterior hips, left worse than right.  Worse with walking Better with rest  Denies radiating symptoms Hasn't taken any medication for the pain Never been told he had arthritis Never injured low back or hips before  Mid back pain in musculature Denies midline tenderness Worse with movement Nothing makes it better   Lab Results  Component Value Date   HGBA1C 6.0* 07/13/2013     Patient Active Problem List   Diagnosis Date Noted  . Carotid stenosis 07/13/2013  . Anticoagulated on Xarelto 03/21/2013  . PVD - 123456 LICA, [id number] RICA by doppler 5/14 01/24/2013  . 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 Appendicitis with abscess 03/31/2011  . CAD (coronary artery disease),CABG 1993-LIMA to the LAD, SVG to Om and SVG to PDA/PLA, patent on cath 2014. 03/31/2011  . Chronic a-fib 03/31/2011  . Cardiomyopathy, ischemic, improved to 40-45% 06/2012 by echo, now 15-25% 01/21/13 03/31/2011  . CVA (cerebral vascular accident) 03/31/2011  . GERD  (gastroesophageal reflux disease) 03/31/2011  . Renal calculi 03/31/2011  . History of tobacco use, continues with chewing tobacco 03/31/2011    Past Medical History  Diagnosis Date  . Coronary artery disease   . CAD (coronary artery disease),CABG 1993-LIMA to the LAD, SVG to Om and SVG to PDA/PLA, patent on cath 2008. 2003  . CHF (congestive heart failure)   . Heart murmur   . Atrial fibrillation     permanent  . Hypertension   . High cholesterol   . Heart attack 1980's  . Pneumonia     "in the last 5 yrs" (01/22/2013)  . GERD (gastroesophageal reflux disease)   . Stroke     "slightly drags left foot since; recovered qthing else" (01/21/2013)  . Kidney stones     "passed all but one time when he had to have lithotripsy" (01/21/2013)  . Cardiomyopathy 2008    EF 20% but now improved to 40-45%  . 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 SSS (sick sinus syndrome)   . Pulmonary hypertension   . Carotid artery disease     Past Surgical History  Procedure Laterality Date  . Laparoscopic appendectomy  03/30/2011    Procedure: APPENDECTOMY LAPAROSCOPIC;  Surgeon: Joyice Faster. Cornett, MD;  Location: [facility];  Service: General;  Laterality: N/A;  . Appendectomy    . Eye surgery    . Cataract extraction w/ intraocular lens implant Bilateral 2013  .  Esophagogastroduodenoscopy  02/01/2012    Procedure: ESOPHAGOGASTRODUODENOSCOPY (EGD);  Surgeon: Beryle Beams, MD;  Location: [facility];  Service: Endoscopy;  Laterality: N/A;  . Coronary artery bypass graft  03/01/2002    LIMA to LAD, reverse SVG to OM, reverse SVG to PDA of RCA, reverse SVG to PLA of RCA, ligation of LA appendage (Dr. Servando Snare)  . Lithotripsy      "once" (01/21/2013)  . Tee without cardioversion N/A 01/23/2013    Procedure: TRANSESOPHAGEAL ECHOCARDIOGRAM (TEE);  Surgeon: Sanda Klein, MD;  Location: [facility] ENDOSCOPY;  Service: Cardiovascular;  Laterality: N/A;  . Transthoracic echocardiogram  06/23/2012    EF 40-45%, mild LVH; mild AV  regurg; mild MV regurg; LV mod-severely dilated; RV mildly dilated; systolic pressure borderline increased; RA mod dilated  . Nm myocar perf wall motion  05/2009    persantine myoview - fixed moderate perfusion defect in inferior wall & lateral segment of apex (poor non-transmural infarction), minimal anterolateral periinfarct reversible ischemia seen, abnormal study, defects similar to 2006 study  . Coronary angioplasty  07/05/2006    3 vessel CAD, patent LIMA to LAD, patentVG to OM, patent SVG to PDA & PLA, mild MR, severe LV systolic dysfunction (Dr. Gerrie Nordmann)  . Cardiac catheterization  02/24/2002    reduced LV function, 60-70% prox RCA stenosis, 70% PLA ostial stenosis, 80% secondary branch of PLA stenosis - subsequent CABG (Dr. Jackie Plum)  . Carotid doppler  06/2012    50-69% right bulb/prox ICA diameter reduction; 70-99% left bulb/prox ICA diameter reduction  . Splenectomy, total  01/2013  . Left heart catheterization with coronary angiogram N/A 01/24/2013    Procedure: LEFT HEART CATHETERIZATION WITH CORONARY ANGIOGRAM;  Surgeon: [name], MD;  Location: [facility] CATH LAB;  Service: Cardiovascular;  Laterality: N/A;  Right heart with grafts    History  Substance Use Topics  . Smoking status: Former Smoker -- 2.00 packs/day for 20 years    Types: Cigarettes    Quit date: 04/08/1986  . Smokeless tobacco: Current User    Types: Chew  . Alcohol Use: No    Family History  Problem Relation Age of Onset  . Heart disease Father     rheumatic fever  . Heart attack Brother 67  . Heart disease Daughter   . Stroke Mother   . Stroke Brother 42    Allergies  Allergen Reactions  . Percocet [Oxycodone-Acetaminophen] Itching and Nausea Only    Medication list has been reviewed and updated.  Current Outpatient Prescriptions on File Prior to Visit  Medication Sig Dispense Refill  . aspirin EC 81 MG tablet Take 81 mg by mouth daily.    . carvedilol (COREG) 3.125 MG tablet TAKE 3  TABLETS BY MOUTH TWICE DAILY WITH A MEAL 180 tablet 9  . furosemide (LASIX) 40 MG tablet Take 0.5 tablets (20 mg total) by mouth daily. 30 tablet 5  . lisinopril (PRINIVIL,ZESTRIL) 10 MG tablet Take 1 tablet (10 mg total) by mouth daily. 30 tablet 6  . pantoprazole (PROTONIX) 40 MG tablet TAKE 1 TABLET BY MOUTH EVERY DAY 90 tablet 2  . potassium chloride SA (K-DUR,KLOR-CON) 20 MEQ tablet Take 1 tablet (20 mEq total) by mouth daily. 30 tablet 6  . rivaroxaban (XARELTO) 20 MG TABS tablet TAKE 1 TABLET BY MOUTH EVERY DAY BEFORE SUPPER 30 tablet 6  . rivaroxaban (XARELTO) 20 MG TABS tablet Take 1 tablet (20 mg total) by mouth daily with supper. 30 tablet 3  . simvastatin (ZOCOR) 40  MG tablet Take 1 tablet (40 mg total) by mouth at bedtime. 90 tablet 3   No current facility-administered medications on file prior to visit.    Review of Systems:  Gen: alert and oriented, no acute distress, well nourished Cardio:denies CP, denies palpitations, denies bradycardia, denies tachycardia Pulm: denies SOB, denies accessory muscle use, denies wheezing GI: denies nausea, vomiting, diarrhea, bowel incontinence, denies abdominal pain Integ: denies rash, trauma, or new skin lesions Musculo: denies new onset weakness, denies new loss of sensation, pain in bilateral hip joints, pain in mid-back Psych: denies anxiety or depression   Physical Examination: There were no vitals filed for this visit. There were no vitals filed for this visit. There is no weight on file to calculate BMI. Ideal Body Weight:    BP 126/74 mmHg  Pulse 56  Temp(Src) 97.8 F (36.6 C) (Oral)  Resp 16  Ht 5' 8.5" (1.74 m)  Wt 198 lb 3.2 oz (89.903 kg)  BMI 29.69 kg/m2   GEN: WDWN, NAD, Non-toxic, A & O x 3, well appearing HEENT: Atraumatic, Normocephalic. Neck supple. No masses, No LAD. Neck: no JVD, bilateral carotid bruits Ears and Nose: No external deformity. CV: irregularly irregular rhythm, 2/6 systolic murmur, No G/R.  No JVD. No thrill. No extra heart sounds, pulses 2+. PULM: CTAB, no wheezes, crackles, rhonchi. No retractions. No resp. distress. No accessory muscle use. ABD: S, NT, ND EXTR: No c/c/e musculo: no CVA tenderness, mild TTP of thoracic musculature with hypertonicity, mild TTP of bilateral SI joints, pain in SI joint with straight leg raise bilaterally, +FADIR bilaterally, - FABER testing, no midline tenderness of lumbar or thoracic spine NEURO Normal gait, nml LE reflexes bilaterally PSYCH: Normally interactive. Conversant. Not depressed or anxious appearing.  Calm demeanor.  INTEG: nml skin turgor, no rashes  [facility] primary radiology read by Dr. Lorelei Pont Right hip: negative Left hip: negative Evidence of SIJ OA bilaterally   DG HIP (WITH OR WITHOUT PELVIS) 2-3V RIGHT  COMPARISON: None  FINDINGS: A frogleg view of the right hip shows normal joint space. No significant degenerative change is seen. No acute abnormality is noted.  IMPRESSION: Negative. DG HIP (WITH OR WITHOUT PELVIS) 2-3V LEFT  COMPARISON: None.  FINDINGS: The osseous structures of the hip appear normal. Arterial calcifications in the pelvis and proximal left thigh. Pelvic bones appear normal. Degenerative disc disease in the lower lumbar spine.  IMPRESSION: Normal left hip.  Assessment and Plan:  Hip pain, unspecified laterality - Plan: DG HIP UNILAT W OR W/O PELVIS 2-3 VIEWS LEFT, DG HIP UNILAT WITH PELVIS 2-3 VIEWS RIGHT  Immunization due - Plan: Pneumococcal conjugate vaccine 13-valent IM  Medication monitoring encounter - Plan: Basic metabolic panel  Pt to receive prevnar today  Xrays consistent with sacroiliac arthritis consistent with what is causing his SI joint pain Counseled on use of tylenol as needed to alleviate pain, Ice/heat Also counseled on the risks of using Ibuprofen or other NSAIDs as he is on xarelto Counseled on importance of staying active and improvement in bone health  Offered  referral to ortho for consideration of corticosteroid injections but patient declined at this time  Dr. Rolland Bimler, MD Co- Signed by Lamar Blinks, MD

## 2014-10-24 ENCOUNTER — Ambulatory Visit (INDEPENDENT_AMBULATORY_CARE_PROVIDER_SITE_OTHER): Payer: Medicare Other | Admitting: Cardiovascular Disease

## 2014-10-24 ENCOUNTER — Encounter: Payer: Self-pay | Admitting: Cardiovascular Disease

## 2014-10-24 VITALS — BP 136/74 | HR 67 | Ht 68.5 in | Wt 197.1 lb

## 2014-10-24 DIAGNOSIS — I25810 Atherosclerosis of coronary artery bypass graft(s) without angina pectoris: Secondary | ICD-10-CM

## 2014-10-24 DIAGNOSIS — Z7901 Long term (current) use of anticoagulants: Secondary | ICD-10-CM

## 2014-10-24 DIAGNOSIS — I482 Chronic atrial fibrillation, unspecified: Secondary | ICD-10-CM

## 2014-10-24 DIAGNOSIS — I255 Ischemic cardiomyopathy: Secondary | ICD-10-CM | POA: Diagnosis not present

## 2014-10-24 DIAGNOSIS — I739 Peripheral vascular disease, unspecified: Secondary | ICD-10-CM

## 2014-10-24 DIAGNOSIS — I6529 Occlusion and stenosis of unspecified carotid artery: Secondary | ICD-10-CM | POA: Diagnosis not present

## 2014-10-24 MED ORDER — LISINOPRIL 10 MG PO TABS: 15.0000 mg | ORAL_TABLET | Freq: Every day | ORAL | Status: DC

## 2014-10-24 NOTE — Patient Instructions (Signed)
Your physician has requested that you have a carotid duplex and office appointment in December 2016.   This test is an ultrasound of the carotid arteries in your neck. It looks at blood flow through these arteries that supply the brain with blood. Allow one hour for this exam. There are no restrictions or special instructions.

## 2014-10-26 ENCOUNTER — Encounter: Payer: Self-pay | Admitting: Cardiovascular Disease

## 2014-10-26 NOTE — Progress Notes (Signed)
Patient ID: Gregory [name], male   DOB: 08/28/1932, 81 y.o.   MRN: [id number]      HPI: Gregory [name] is a 81 y.o. male returns for a 3 month followup cardiology evaluation.  He is a former patient of Dr. Rollene Fare.    Gregory [name] has a history of known CAD in 2003 underwent CABG revascularization surgery by Dr. Servando Snare  (LIMA  to  LAD, vein graft to the obtuse marginal, sequential vein graft to the PDA and PLA of RCA).  His last cardiac catheterization was in 2008 done by Dr. Tami Ribas. At that time, ejection fraction was 20-25%. He declined consideration for ICD. Subsequent ejection fraction improved to approximately 35-40% in September 2012 on medical therapy. In the past, he has refused Coumadin therapy and had been maintained on aspirin and Plavix. In 2013 he was hospitalized for appendicitis with abscess requiring laparoscopic appendectomy. He was hospitalized in November 2014 and was felt to have a splenic infarct of embolic etiology. He underwent a transesophageal echocardiogram on 01/23/2013 by Dr. Sallyanne Kuster which revealed an ejection fraction in the range of 15-25%. There was diffuse hypokinesis with severe hypokinesis to akinesis of the anteroseptal apical myocardium. He had increased left ventricular end-diastolic filling pressure and elevated left atrial filling pressure. There was evidence for large solid fixed thrombus occupying the entire atrial appendage with moderate continuous spontaneous echo contrast ("smoke") in the left atrial cavity. There was no evidence for right atrial thrombus. At that time, the patient refused life vest.  He has been on Xarelto for anticoagulation.He denies chest pain. He denies significant shortness of breath. He is unaware of tachdysrhythmias.   He has permanent atrial fibrillation, but his rate has been controlled.  He denies any presyncope or syncope.  He denies any chest pain.  A followup echo Doppler study on 06/14/2013 revealed significant improvement in  LV function with an ejection fraction estimated now at 50-55% without regional wall motion abnormalities.  He did have mild aortic root dilatation, mild aortic insufficiency, and mild mitral regurgitation with biatrial enlargement.    He has severe bilateral carotid artery disease.  Mostly he had been seen by Dr. Oneida Alar.  His last carotid duplex scan was significantly abnormal with his left carotid velocities increased to 295 systolic and 188 diastolic with severe fibrous plaque and elevated velocities within all segments of his left internal carotid.  There also was 50-69% diameter reduction in the right bulb/proximal ICA. I referred him to Dr. Gwenlyn Found who subsequently referred him to Dr. Trula Slade.  He denies chest pain.  He denies palpitations.  He denies shortness of breath.  He continues to say he is asymptomatic with reference to his carotid disease and has refused intervention.  Over the past several months, he denies any change in symptomatology.  A follow-up Doppler study was done on 08/23/2014.  This showed increased velocities in the left at 492 over 148 at the MI, CVA, level and to 30/67, at the PICA level.  On the right, his PICA velocity was 292/78.  When compared to his prior study.  This has not increased and has remained fairly stable.  He denies chest pain or palpitations.  He denies paresthesias.  He denies visual symptoms.  Past Medical History  Diagnosis Date  . Coronary artery disease   . CAD (coronary artery disease),CABG 1993-LIMA to the LAD, SVG to Om and SVG to PDA/PLA, patent on cath 2008. 2003  . CHF (congestive heart failure)   . Heart murmur   .  Atrial fibrillation     permanent  . Hypertension   . High cholesterol   . Heart attack 1980's  . Pneumonia     "in the last 5 yrs" (01/22/2013)  . GERD (gastroesophageal reflux disease)   . Stroke     "slightly drags left foot since; recovered qthing else" (01/21/2013)  . Kidney stones     "passed all but one time when he had  to have lithotripsy" (01/21/2013)  . Cardiomyopathy 2008    EF 20% but now improved to 40-45%  . 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 SSS (sick sinus syndrome)   . Pulmonary hypertension   . Carotid artery disease     Past Surgical History  Procedure Laterality Date  . Laparoscopic appendectomy  03/30/2011    Procedure: APPENDECTOMY LAPAROSCOPIC;  Surgeon: Joyice Faster. Cornett, MD;  Location: [place];  Service: General;  Laterality: N/A;  . Appendectomy    . Eye surgery    . Cataract extraction w/ intraocular lens implant Bilateral 2013  . Esophagogastroduodenoscopy  02/01/2012    Procedure: ESOPHAGOGASTRODUODENOSCOPY (EGD);  Surgeon: Beryle Beams, MD;  Location: [facility];  Service: Endoscopy;  Laterality: N/A;  . Coronary artery bypass graft  03/01/2002    LIMA to LAD, reverse SVG to OM, reverse SVG to PDA of RCA, reverse SVG to PLA of RCA, ligation of LA appendage (Dr. Servando Snare)  . Lithotripsy      "once" (01/21/2013)  . Tee without cardioversion N/A 01/23/2013    Procedure: TRANSESOPHAGEAL ECHOCARDIOGRAM (TEE);  Surgeon: Sanda Klein, MD;  Location: [facility] ENDOSCOPY;  Service: Cardiovascular;  Laterality: N/A;  . Transthoracic echocardiogram  06/23/2012    EF 40-45%, mild LVH; mild AV regurg; mild MV regurg; LV mod-severely dilated; RV mildly dilated; systolic pressure borderline increased; RA mod dilated  . Nm myocar perf wall motion  05/2009    persantine myoview - fixed moderate perfusion defect in inferior wall & lateral segment of apex (poor non-transmural infarction), minimal anterolateral periinfarct reversible ischemia seen, abnormal study, defects similar to 2006 study  . Coronary angioplasty  07/05/2006    3 vessel CAD, patent LIMA to LAD, patentVG to OM, patent SVG to PDA & PLA, mild MR, severe LV systolic dysfunction (Dr. Gerrie Nordmann)  . Cardiac catheterization  02/24/2002    reduced LV function, 60-70% prox RCA stenosis, 70% PLA ostial stenosis, 80% secondary branch of PLA stenosis -  subsequent CABG (Dr. Jackie Plum)  . Carotid doppler  06/2012    50-69% right bulb/prox ICA diameter reduction; 70-99% left bulb/prox ICA diameter reduction  . Splenectomy, total  01/2013  . Left heart catheterization with coronary angiogram N/A 01/24/2013    Procedure: LEFT HEART CATHETERIZATION WITH CORONARY ANGIOGRAM;  Surgeon: [name], MD;  Location: [facility] CATH LAB;  Service: Cardiovascular;  Laterality: N/A;  Right heart with grafts    Allergies  Allergen Reactions  . Percocet [Oxycodone-Acetaminophen] Itching and Nausea Only    Current Outpatient Prescriptions  Medication Sig Dispense Refill  . acetaminophen (TYLENOL) 500 MG tablet Take 500 mg by mouth every 6 (six) hours as needed.    Marland Kitchen aspirin EC 81 MG tablet Take 81 mg by mouth daily.    . carvedilol (COREG) 3.125 MG tablet TAKE 3 TABLETS BY MOUTH TWICE DAILY WITH A MEAL 180 tablet 9  . furosemide (LASIX) 40 MG tablet Take 0.5 tablets (20 mg total) by mouth daily. 30 tablet 5  . lisinopril (PRINIVIL,ZESTRIL) 10 MG tablet Take 1.5 tablets (15 mg total)  by mouth daily. 45 tablet 6  . pantoprazole (PROTONIX) 40 MG tablet TAKE 1 TABLET BY MOUTH EVERY DAY 90 tablet 2  . rivaroxaban (XARELTO) 20 MG TABS tablet Take 1 tablet (20 mg total) by mouth daily with supper. 30 tablet 3  . simvastatin (ZOCOR) 40 MG tablet Take 1 tablet (40 mg total) by mouth at bedtime. 90 tablet 3   No current facility-administered medications for this visit.    Social History   Social History  . Marital Status: Married    Spouse Name: N/A  . Number of Children: 8  . Years of Education: 8   Occupational History  . retired    Social History Main Topics  . Smoking status: Former Smoker -- 2.00 packs/day for 20 years    Types: Cigarettes    Quit date: 04/08/1986  . Smokeless tobacco: Current User    Types: Chew  . Alcohol Use: No  . Drug Use: No  . Sexual Activity: Not on file   Other Topics Concern  . Not on file   Social History  Narrative    Family History  Problem Relation Age of Onset  . Heart disease Father     rheumatic fever  . Heart attack Brother 77  . Stroke Mother   . Stroke Brother 68   ROS General: Negative; No fevers, chills, or night sweats;  HEENT: Negative; No changes in vision or hearing, sinus congestion, difficulty swallowing Pulmonary: Negative; No cough, wheezing, shortness of breath, hemoptysis Cardiovascular: See history of present illness GI: positive for GERD; No nausea, vomiting, diarrhea, or abdominal pain GU: Negative; No dysuria, hematuria, or difficulty voiding Musculoskeletal: Negative; no myalgias, joint pain, or weakness Hematologic/Oncology: Negative; no easy bruising, bleeding Endocrine: Negative; no heat/cold intolerance; no diabetes Neuro: positive for remote CVA Skin: Negative; No rashes or skin lesions Psychiatric: Negative; No behavioral problems, depression Sleep: Negative; No snoring, daytime sleepiness, hypersomnolence, bruxism, restless legs, hypnogognic hallucinations, no cataplexy Other comprehensive 14 point system review is negative.   PE BP 136/74 mmHg  Pulse 67  Ht 5' 8.5" (1.74 m)  Wt 197 lb 2 oz (89.415 kg)  BMI 29.53 kg/m2   Wt Readings from Last 3 Encounters:  10/24/14 197 lb 2 oz (89.415 kg)  10/22/14 198 lb 3.2 oz (89.903 kg)  07/26/14 198 lb 11.2 oz (90.13 kg)  General: Alert, oriented, no distress.  Full bearded. Skin: normal turgor, no rashes HEENT: Normocephalic, atraumatic. Pupils round and reactive; sclera anicteric;no lid lag.  Nose without nasal septal hypertrophy Mouth/Parynx benign; Mallinpatti scale 3 Neck: No JVD, bilateral carotid bruits with slightly delayed upstroke. Lungs: clear to ausculatation and percussion; no wheezing or rales Chest wall: no tenderness to palpitation Heart: Irregularly irregular rhythm compatible with atrial fibrillation with a controlled ventricular response, s1 s2 normal 2/6 systolic murmur. No S3  gallop.  No diastolic murmur.  No rubs, thrills or heaves to Abdomen: soft, nontender; no hepatosplenomehaly, BS+; abdominal aorta nontender and not dilated by palpation. Back: no CVA tenderness Pulses 2+ Extremities: no clubbing cyanosis or edema, Homan's sign negative  Neurologic: grossly nonfocal Psychologic: normal affect and mood.  ECG (independently read by me): Atrial fibrillation at 67 bpm.  2 ventricular premature beats.  Poor R-wave progression  May 2016 ECG (independently read by me): Atrial fibrillation with occasional PVCs.  Heart rate 72.  ECG (independently read by me): Atrial fibrillation with controlled ventricular response in the 70s.  Poor anterior R-wave progression.  Prior April 2015 ECG (independently  read by me): Atrial fibrillation at 56 beats per minute.  QTc interval 395 ms  Prior 02/23/2013 ECG: Atrial fibrillation at 80 beats per minute with isolated PVC.  LABS: BMP Latest Ref Rng 10/22/2014 08/16/2014 07/30/2014  Glucose 65 - 99 mg/dL 90 81 107(H)  BUN 7 - 25 mg/dL 16 28(H) 25(H)  Creatinine 0.70 - 1.11 mg/dL 1.11 1.47(H) 1.60(H)  Sodium 135 - 146 mmol/L 142 145 142  Potassium 3.5 - 5.3 mmol/L 4.3 4.6 4.8  Chloride 98 - 110 mmol/L 105 109 105  CO2 20 - 31 mmol/L _0 Calcium 8.6 - 10.3 mg/dL 8.6 8.6 8.8   Hepatic Function Latest Ref Rng 07/26/2014 01/23/2014 07/13/2013  Total Protein 6.0 - 8.3 g/dL 7.3 6.5 6.5  Albumin 3.5 - 5.2 g/dL 4.0 3.6 3.8  AST 0 - 37 U/L _1 ALT 0 - 53 U/L _2 Alk Phosphatase 39 - 117 U/L 82 62 64  Total Bilirubin 0.2 - 1.2 mg/dL 0.7 0.5 0.6  Bilirubin, Direct 0.0 - 0.3 mg/dL - - -   CBC Latest Ref Rng 07/26/2014 02/09/2014 01/23/2014  WBC 4.0 - 10.5 K/uL 7.4 13.4(A) 6.2  Hemoglobin 13.0 - 17.0 g/dL 12.2(L) 11.2(A) 11.4(L)  Hematocrit 39.0 - 52.0 % 36.4(L) 35.1(A) 33.3(L)  Platelets 150 - 400 K/uL 240 - 237   Lab Results  Component Value Date   MCV 88.3 07/26/2014   MCV 94.0 02/09/2014   MCV 89.8 01/23/2014     Lab Results  Component Value Date   TSH 4.403 01/23/2014   Lab Results  Component Value Date   HGBA1C 6.0* 07/13/2013   Lipid Panel     Component Value Date/Time   CHOL 167 07/26/2014 1616   TRIG 82 07/26/2014 1616   HDL 45 07/26/2014 1616   CHOLHDL 3.7 07/26/2014 1616   VLDL 16 07/26/2014 1616   LDLCALC 106* 07/26/2014 1616    ASSESSMENT AND PLAN: Gregory [name] is a 81 year old gentleman who is status post CABG revascularization surgery in 2003. He has a history of an  ischemic cardiomyopathy with an initial ejection fraction of 25% with subsequent improvement to approximately 35-40%.  He has a remote history of a small right brain CVA in 2005  with subsequent recovery. 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 He has documented extensive thrombus in his left atrium. He now is on anticoagulation therapy which needs to be lifelong.  His blood pressure today is upper normal at 465 systolic.Marland Kitchen He is tolerating ACE inhibition with lisinopril 10 mg and beta blocker therapy, currently taking carvedilol 9.375 mg twice a day. In the past he had refused an ICD.  His last echo suggested significant improvement in LV function.  He has severe carotid occlusive disease.  When I last saw him I spent a long time discussing the potential reduction of stroke risk with treatment for his high-grade stenosis.  I mentioned to him the fact that he had undergone CABG revascularization surgery may have significantly reduced his future heart attack risk.  If he had not had his surgery.  His follow-up carotid study has not significant changed from previously and actually slowed showed slight reduction of the systolic velocity in the left carotid, but this still remains significantly elevated.  At present, he remains steadfast in his decision at this point not to undergo carotid intervention.  We will have a follow-up carotid study in 6  months for  further assessment.  He continues to be on Xarelto for anticoagulation and denies bleeding.  He is on simvastatin for hyperlipidemia with target LDL less than 70.  His simvastatin may need to be changed to high-dose atorvastatin or Crestor for more aggressive intervention.  His renal function has improved.  He will decrease his Lasix dose and slightly titrate his lisinopril dose to 15 mg.  Follow-up laboratory will be obtained in several months with plans for follow-up carotid study in December 2016.   Gregory Sine, MD, [facility]  10/26/2014 6:57 PM

## 2014-11-01 ENCOUNTER — Telehealth: Payer: Self-pay

## 2014-11-01 DIAGNOSIS — M25552 Pain in left hip: Principal | ICD-10-CM

## 2014-11-01 DIAGNOSIS — M25551 Pain in right hip: Secondary | ICD-10-CM

## 2014-11-01 NOTE — Telephone Encounter (Signed)
PATIENT' S DAUGHTER (CYNTHIA) STATES DR. Lorelei Pont SAW HIM ON AUG. 8TH FOR HIS ANNUAL PHYSICAL. SHE ALSO X-RAYED HIS BACK. HE HAS ARTHRITIS IN IT. DR. Lorelei Pont TOLD HIM TO TAKE TYLENOL FOR HIS PAIN. THEY HAVE TRIED EXTRA STRENGTH AND TYLENOL ARTHRITIS. NEITHER IS HELPING. HE CALLED HIS HEART DOCTOR (DR. KELLY). HE SUGGESTED MR. Gavilanes CALL HIS PCP TO CALL HIM IN SOMETHING LIKE TORADOL OR ANY KIND OF NON-STEROID PAIN MEDICATION. HIS DAUGHTER WOULD LIKE FOR DR. Lorelei Pont TO DO THAT. BEST PHONE [phone] (CYNTHIA RADFORD) OR [phone] (MR. [place])  [place] ON [address]. [place]

## 2014-11-02 ENCOUNTER — Encounter (HOSPITAL_COMMUNITY): Payer: Self-pay

## 2014-11-02 MED ORDER — TRAMADOL HCL 50 MG PO TABS: ORAL_TABLET | ORAL | Status: DC

## 2014-11-02 NOTE — Telephone Encounter (Signed)
Called and spoke with Gregory [name]- apologized that I just got this message today Discussed NSAID medications but there is increased concern for bleeding as he is on xarelto as well.  I would hesitate to use this combination especially at his age.    He has a history of itching with oxycodone, but has tolerated hydrocodone in the past except he felt a bit dizzy.   He did not have any swelling or hives with oxycodone in the past.    Will rx tramadol for him to try- try a 1/2 tablet first as it could cause sedation.  They will keep me posted as to his progress.   If any itching or other SE will DC medication and let me know   Meds ordered this encounter  Medications  . traMADol (ULTRAM) 50 MG tablet    Sig: Take a 1/2 tablet every 8 hours as needed for pain    Dispense:  30 tablet    Refill:  0

## 2014-11-20 NOTE — Addendum Note (Signed)
Addended byLauralee Evener. on: 11/20/2014 10:31 AM   Modules accepted: Orders

## 2014-12-03 ENCOUNTER — Other Ambulatory Visit: Payer: Self-pay | Admitting: Cardiovascular Disease

## 2014-12-03 NOTE — Telephone Encounter (Signed)
Rx(s) sent to pharmacy electronically.  

## 2015-02-13 ENCOUNTER — Ambulatory Visit (INDEPENDENT_AMBULATORY_CARE_PROVIDER_SITE_OTHER): Payer: Medicare Other

## 2015-02-13 DIAGNOSIS — Z23 Encounter for immunization: Secondary | ICD-10-CM

## 2015-03-06 ENCOUNTER — Ambulatory Visit (HOSPITAL_COMMUNITY)
Admission: RE | Admit: 2015-03-06 | Discharge: 2015-03-06 | Disposition: A | Discharge status: Home / Self Care | Payer: Medicare Other | Source: Ambulatory Visit | Attending: Cardiovascular Disease | Admitting: Cardiovascular Disease

## 2015-03-06 ENCOUNTER — Ambulatory Visit (INDEPENDENT_AMBULATORY_CARE_PROVIDER_SITE_OTHER): Payer: Medicare Other | Admitting: Cardiovascular Disease

## 2015-03-06 ENCOUNTER — Encounter: Payer: Self-pay | Admitting: Cardiovascular Disease

## 2015-03-06 VITALS — BP 146/58 | HR 69 | Ht 67.0 in | Wt 195.0 lb

## 2015-03-06 DIAGNOSIS — I10 Essential (primary) hypertension: Secondary | ICD-10-CM | POA: Diagnosis not present

## 2015-03-06 DIAGNOSIS — E785 Hyperlipidemia, unspecified: Secondary | ICD-10-CM

## 2015-03-06 DIAGNOSIS — I2510 Atherosclerotic heart disease of native coronary artery without angina pectoris: Secondary | ICD-10-CM | POA: Diagnosis not present

## 2015-03-06 DIAGNOSIS — I255 Ischemic cardiomyopathy: Secondary | ICD-10-CM | POA: Diagnosis not present

## 2015-03-06 DIAGNOSIS — E7800 Pure hypercholesterolemia, unspecified: Secondary | ICD-10-CM | POA: Insufficient documentation

## 2015-03-06 DIAGNOSIS — I6523 Occlusion and stenosis of bilateral carotid arteries: Secondary | ICD-10-CM | POA: Insufficient documentation

## 2015-03-06 DIAGNOSIS — Z7901 Long term (current) use of anticoagulants: Secondary | ICD-10-CM

## 2015-03-06 DIAGNOSIS — I6529 Occlusion and stenosis of unspecified carotid artery: Secondary | ICD-10-CM

## 2015-03-06 DIAGNOSIS — I482 Chronic atrial fibrillation, unspecified: Secondary | ICD-10-CM

## 2015-03-06 DIAGNOSIS — I2583 Coronary atherosclerosis due to lipid rich plaque: Principal | ICD-10-CM

## 2015-03-06 MED ORDER — ROSUVASTATIN CALCIUM 40 MG PO TABS
40.0000 mg | ORAL_TABLET | Freq: Every day | ORAL | Status: DC
Start: 2015-03-06 — End: 2015-11-07

## 2015-03-06 NOTE — Patient Instructions (Addendum)
Your physician recommends that you return for lab work in: 6-8 weeks fasting.  Your physician has recommended you make the following change in your medication: the simvastatin has been changed to generic crestor 40 mg.  Your physician wants you to follow-up in: 6 months or sooner if needed. You will receive a reminder letter in the mail two months in advance. If you don't receive a letter, please call our office to schedule the follow-up appointment.

## 2015-03-07 ENCOUNTER — Encounter: Payer: Self-pay | Admitting: Cardiovascular Disease

## 2015-03-07 NOTE — Progress Notes (Signed)
Patient ID: Gregory [name], male   DOB: 1932/11/23, 81 y.o.   MRN: [id number]     HPI: Gregory [name] is a 81 y.o. male returns for a 3 month followup cardiology evaluation.  He is a former patient of Dr. Rollene Fare.    Gregory [name] has a history of known CAD in 2003 underwent CABG revascularization surgery by Dr. Servando Snare  (LIMA  to  LAD, vein graft to the obtuse marginal, sequential vein graft to the PDA and PLA of RCA).  His last cardiac catheterization was in 2008 done by Dr. Tami Ribas. At that time, ejection fraction was 20-25%. He declined consideration for ICD. Ejection fraction improved to approximately 35-40% in September 2012 on medical therapy. In the past, he has refused Coumadin therapy and had been maintained on aspirin and Plavix. In 2013 he was hospitalized for appendicitis with abscess requiring laparoscopic appendectomy. He was hospitalized in November 2014 and was felt to have a splenic infarct of embolic etiology. He underwent a transesophageal echocardiogram on 01/23/2013 by Dr. Sallyanne Kuster which revealed an ejection fraction in the range of 15-25%. There was diffuse hypokinesis with severe hypokinesis to akinesis of the anteroseptal apical myocardium. He had increased left ventricular end-diastolic filling pressure and elevated left atrial filling pressure. There was evidence for large solid fixed thrombus occupying the entire atrial appendage with moderate continuous spontaneous echo contrast ("smoke") in the left atrial cavity. There was no evidence for right atrial thrombus. At that time, the patient refused life vest.  He has been on Xarelto for anticoagulation.He denies chest pain. He denies significant shortness of breath. He is unaware of tachdysrhythmias.   He has permanent atrial fibrillation, but his rate has been controlled.  He denies any presyncope or syncope.  He denies any chest pain.  A followup echo Doppler study on 06/14/2013 revealed significant improvement in LV function  with an ejection fraction estimated now at 50-55% without regional wall motion abnormalities.  He did have mild aortic root dilatation, mild aortic insufficiency, and mild mitral regurgitation with biatrial enlargement.    He has severe bilateral carotid artery disease.  He had been seen by Dr. Oneida Alar.  His prior carotid duplex scan was significantly abnormal with his left carotid velocities increased to 154 systolic and 008 diastolic with severe fibrous plaque and elevated velocities within all segments of his left internal carotid.  There also was 50-69% diameter reduction in the right bulb/proximal ICA. I referred him to Dr. Gwenlyn Found who subsequently referred him to Dr. Trula Slade.  He denies chest pain.  He denies palpitations.  He denies shortness of breath.  He continues to say he is asymptomatic with reference to his carotid disease and has refused intervention.  When I last saw him  he denied any change in symptomatology.  A follow-up Doppler study on 08/23/2014 showed increased velocities in the left at 492 over 148 at the MICA level and to 30/67, at the PICA level.  On the right, his PICA velocity was 292/78.  When compared to his prior study.  This has not increased and has remained fairly stable.  He denies chest pain or palpitations.  He denies paresthesias.  He denies visual symptoms.  Since I last saw him, he states that he has remained asymptomatic.  He has refused to consider any potential carotid intervention or surgery.  He is unaware of any fast heartbeats with his chronic atrial fibrillation.  He denies bleeding.  He underwent a follow-up carotid study earlier today.  A  preliminary report continues to demonstrate severe stenosis with peak  velocity in the left MICA at 500/122 and PICA 241/34. On the right,PICA was 293/106.  He presents for follow-up evaluation.  Past Medical History  Diagnosis Date  . Coronary artery disease   . CAD (coronary artery disease),CABG 1993-LIMA to the LAD, SVG  to Om and SVG to PDA/PLA, patent on cath 2008. 2003  . CHF (congestive heart failure) ([place])   . Heart murmur   . Atrial fibrillation ([place])     permanent  . Hypertension   . High cholesterol   . Heart attack ([place]) 1980's  . Pneumonia     "in the last 5 yrs" (01/22/2013)  . GERD (gastroesophageal reflux disease)   . Stroke [facility])     "slightly drags left foot since; recovered qthing else" (01/21/2013)  . Kidney stones     "passed all but one time when he had to have lithotripsy" (01/21/2013)  . Cardiomyopathy ([place]) 2008    EF 20% but now improved to 40-45%  . 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 SSS (sick sinus syndrome) ([place])   . Pulmonary hypertension ([place])   . Carotid artery disease [facility])     Past Surgical History  Procedure Laterality Date  . Laparoscopic appendectomy  03/30/2011    Procedure: APPENDECTOMY LAPAROSCOPIC;  Surgeon: Joyice Faster. Cornett, MD;  Location: [place];  Service: General;  Laterality: N/A;  . Appendectomy    . Eye surgery    . Cataract extraction w/ intraocular lens implant Bilateral 2013  . Esophagogastroduodenoscopy  02/01/2012    Procedure: ESOPHAGOGASTRODUODENOSCOPY (EGD);  Surgeon: Beryle Beams, MD;  Location: [facility];  Service: Endoscopy;  Laterality: N/A;  . Coronary artery bypass graft  03/01/2002    LIMA to LAD, reverse SVG to OM, reverse SVG to PDA of RCA, reverse SVG to PLA of RCA, ligation of LA appendage (Dr. Servando Snare)  . Lithotripsy      "once" (01/21/2013)  . Tee without cardioversion N/A 01/23/2013    Procedure: TRANSESOPHAGEAL ECHOCARDIOGRAM (TEE);  Surgeon: Sanda Klein, MD;  Location: [facility] ENDOSCOPY;  Service: Cardiovascular;  Laterality: N/A;  . Transthoracic echocardiogram  06/23/2012    EF 40-45%, mild LVH; mild AV regurg; mild MV regurg; LV mod-severely dilated; RV mildly dilated; systolic pressure borderline increased; RA mod dilated  . Nm myocar perf wall motion  05/2009    persantine myoview - fixed moderate perfusion defect in inferior  wall & lateral segment of apex (poor non-transmural infarction), minimal anterolateral periinfarct reversible ischemia seen, abnormal study, defects similar to 2006 study  . Coronary angioplasty  07/05/2006    3 vessel CAD, patent LIMA to LAD, patentVG to OM, patent SVG to PDA & PLA, mild MR, severe LV systolic dysfunction (Dr. Gerrie Nordmann)  . Cardiac catheterization  02/24/2002    reduced LV function, 60-70% prox RCA stenosis, 70% PLA ostial stenosis, 80% secondary branch of PLA stenosis - subsequent CABG (Dr. Jackie Plum)  . Carotid doppler  06/2012    50-69% right bulb/prox ICA diameter reduction; 70-99% left bulb/prox ICA diameter reduction  . Splenectomy, total  01/2013  . Left heart catheterization with coronary angiogram N/A 01/24/2013    Procedure: LEFT HEART CATHETERIZATION WITH CORONARY ANGIOGRAM;  Surgeon: [name], MD;  Location: [facility] CATH LAB;  Service: Cardiovascular;  Laterality: N/A;  Right heart with grafts    Allergies  Allergen Reactions  . Percocet [Oxycodone-Acetaminophen] Itching and Nausea Only    Current Outpatient Prescriptions  Medication Sig Dispense  Refill  . acetaminophen (TYLENOL) 500 MG tablet Take 500 mg by mouth every 6 (six) hours as needed.    Marland Kitchen aspirin EC 81 MG tablet Take 81 mg by mouth daily.    . carvedilol (COREG) 3.125 MG tablet TAKE 3 TABLETS BY MOUTH TWICE DAILY WITH A MEAL 180 tablet 9  . furosemide (LASIX) 40 MG tablet Take 0.5 tablets (20 mg total) by mouth daily. 30 tablet 5  . lisinopril (PRINIVIL,ZESTRIL) 10 MG tablet Take 1.5 tablets (15 mg total) by mouth daily. 45 tablet 6  . pantoprazole (PROTONIX) 40 MG tablet TAKE 1 TABLET BY MOUTH EVERY DAY 90 tablet 3  . rivaroxaban (XARELTO) 20 MG TABS tablet Take 1 tablet (20 mg total) by mouth daily with supper. 30 tablet 3  . traMADol (ULTRAM) 50 MG tablet Take a 1/2 tablet every 8 hours as needed for pain 30 tablet 0  . rosuvastatin (CRESTOR) 40 MG tablet Take 1 tablet (40 mg total) by mouth  daily. 30 tablet 6   No current facility-administered medications for this visit.    Social History   Social History  . Marital Status: Married    Spouse Name: N/A  . Number of Children: 8  . Years of Education: 8   Occupational History  . retired    Social History Main Topics  . Smoking status: Former Smoker -- 2.00 packs/day for 20 years    Types: Cigarettes    Quit date: 04/08/1986  . Smokeless tobacco: Current User    Types: Chew  . Alcohol Use: No  . Drug Use: No  . Sexual Activity: Not on file   Other Topics Concern  . Not on file   Social History Narrative    Family History  Problem Relation Age of Onset  . Heart disease Father     rheumatic fever  . Heart attack Brother 53  . Stroke Mother   . Stroke Brother 68   ROS General: Negative; No fevers, chills, or night sweats;  HEENT: Negative; No changes in vision or hearing, sinus congestion, difficulty swallowing Pulmonary: Negative; No cough, wheezing, shortness of breath, hemoptysis Cardiovascular: See history of present illness GI: positive for GERD; No nausea, vomiting, diarrhea, or abdominal pain GU: Negative; No dysuria, hematuria, or difficulty voiding Musculoskeletal: Negative; no myalgias, joint pain, or weakness Hematologic/Oncology: Negative; no easy bruising, bleeding Endocrine: Negative; no heat/cold intolerance; no diabetes Neuro: positive for remote CVA Skin: Negative; No rashes or skin lesions Psychiatric: Negative; No behavioral problems, depression Sleep: Negative; No snoring, daytime sleepiness, hypersomnolence, bruxism, restless legs, hypnogognic hallucinations, no cataplexy Other comprehensive 14 point system review is negative.   PE BP 146/58 mmHg  Pulse 69  Ht '5\' 7"'$  (1.702 m)  Wt 195 lb (88.451 kg)  BMI 30.53 kg/m2   Wt Readings from Last 3 Encounters:  03/06/15 195 lb (88.451 kg)  10/24/14 197 lb 2 oz (89.415 kg)  10/22/14 198 lb 3.2 oz (89.903 kg)  General: Alert,  oriented, no distress.  Full bearded. Skin: normal turgor, no rashes HEENT: Normocephalic, atraumatic. Pupils round and reactive; sclera anicteric;no lid lag.  Nose without nasal septal hypertrophy Mouth/Parynx benign; Mallinpatti scale 3 Neck: No JVD, bilateral carotid bruits with slightly delayed upstroke. Lungs: clear to ausculatation and percussion; no wheezing or rales Chest wall: no tenderness to palpitation Heart: Irregularly irregular rhythm compatible with atrial fibrillation with a controlled ventricular response, s1 s2 normal 2/6 systolic murmur. No S3 gallop.  No diastolic murmur.  No rubs, thrills  or heaves to Abdomen: soft, nontender; no hepatosplenomehaly, BS+; abdominal aorta nontender and not dilated by palpation. Back: no CVA tenderness Pulses 2+ Extremities: no clubbing cyanosis or edema, Homan's sign negative  Neurologic: grossly nonfocal Psychologic: normal affect and mood.  ECG (independently read by me): atrial fibrillation with rate in the 60s with occasional PVC.  QTc interval 452 ms  August 2016 ECG (independently read by me): Atrial fibrillation at 67 bpm.  2 ventricular premature beats.  Poor R-wave progression  May 2016 ECG (independently read by me): Atrial fibrillation with occasional PVCs.  Heart rate 72.  ECG (independently read by me): Atrial fibrillation with controlled ventricular response in the 70s.  Poor anterior R-wave progression.  Prior April 2015 ECG (independently read by me): Atrial fibrillation at 56 beats per minute.  QTc interval 395 ms  Prior 02/23/2013 ECG: Atrial fibrillation at 80 beats per minute with isolated PVC.  LABS: BMP Latest Ref Rng 10/22/2014 08/16/2014 07/30/2014  Glucose 65 - 99 mg/dL 90 81 107(H)  BUN 7 - 25 mg/dL 16 28(H) 25(H)  Creatinine 0.70 - 1.11 mg/dL 1.11 1.47(H) 1.60(H)  Sodium 135 - 146 mmol/L 142 145 142  Potassium 3.5 - 5.3 mmol/L 4.3 4.6 4.8  Chloride 98 - 110 mmol/L 105 109 105  CO2 20 - 31 mmol/L '28 24 27    '$ Calcium 8.6 - 10.3 mg/dL 8.6 8.6 8.8   Hepatic Function Latest Ref Rng 07/26/2014 01/23/2014 07/13/2013  Total Protein 6.0 - 8.3 g/dL 7.3 6.5 6.5  Albumin 3.5 - 5.2 g/dL 4.0 3.6 3.8  AST 0 - 37 U/L '19 16 16  '$ ALT 0 - 53 U/L '12 11 12  '$ Alk Phosphatase 39 - 117 U/L 82 62 64  Total Bilirubin 0.2 - 1.2 mg/dL 0.7 0.5 0.6  Bilirubin, Direct 0.0 - 0.3 mg/dL - - -   CBC Latest Ref Rng 07/26/2014 02/09/2014 01/23/2014  WBC 4.0 - 10.5 K/uL 7.4 13.4(A) 6.2  Hemoglobin 13.0 - 17.0 g/dL 12.2(L) 11.2(A) 11.4(L)  Hematocrit 39.0 - 52.0 % 36.4(L) 35.1(A) 33.3(L)  Platelets 150 - 400 K/uL 240 - 237   Lab Results  Component Value Date   MCV 88.3 07/26/2014   MCV 94.0 02/09/2014   MCV 89.8 01/23/2014   Lab Results  Component Value Date   TSH 4.403 01/23/2014   Lab Results  Component Value Date   HGBA1C 6.0* 07/13/2013   Lipid Panel     Component Value Date/Time   CHOL 167 07/26/2014 1616   TRIG 82 07/26/2014 1616   HDL 45 07/26/2014 1616   CHOLHDL 3.7 07/26/2014 1616   VLDL 16 07/26/2014 1616   LDLCALC 106* 07/26/2014 1616    ASSESSMENT AND PLAN: Gregory [name] is a 81 year old gentleman who is status post CABG revascularization surgery in 2003. He has a history of an  ischemic cardiomyopathy with an initial ejection fraction of 25% with subsequent improvement to approximately 35-40%.  He has a remote history of a small right brain CVA in 2005  with subsequent recovery. 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 He has documented extensive thrombus in his left atrium. He now is on anticoagulation therapy with Xarelto 20 mg daily which needs to be lifelong. His blood pressure today was upper normal to mildly increased at 146/58.  He is now on lisinopril 15 mg, carvedilol 9.375 mg twice a day.  In the past he had refused an ICD.  His last  echo suggested significant improvement in LV function.  He has severe carotid occlusive  disease.  When I last saw him I spent a long time discussing the potential reduction of stroke risk with treatment for his high-grade stenosis.  I mentioned to him the fact that he had undergone CABG revascularization surgery may have significantly reduced his future heart attack risk if he had not had his surgery.  His follow-up carotid study were reviewed with him in detail today and continue to show significant disease, but with some stability. He remains steadfast in his decision at this point not to undergo carotid intervention. I reviewed his recent blood work.  I have recommended discontinuing simvastatin 40 mg in attempt to be much more aggressive with lipid lowering with a target LDL in the 60s.  He will start Crestor which is now generic at 40 mg daily. Blood work will be obtained in several months.  I will see him in 6 months for reevaluation.  Time spent: 25 minutes  Gregory Sine, MD, [facility]  03/07/2015 6:10 PM

## 2015-03-27 ENCOUNTER — Encounter: Payer: Self-pay | Admitting: Internal Medicine

## 2015-04-05 ENCOUNTER — Encounter: Payer: Self-pay | Admitting: Family Medicine

## 2015-04-10 ENCOUNTER — Encounter: Payer: Self-pay | Admitting: Family Medicine

## 2015-05-16 ENCOUNTER — Encounter: Payer: Self-pay | Admitting: Internal Medicine

## 2015-05-16 ENCOUNTER — Telehealth: Payer: Self-pay

## 2015-05-16 ENCOUNTER — Ambulatory Visit (INDEPENDENT_AMBULATORY_CARE_PROVIDER_SITE_OTHER): Payer: Medicare Other | Admitting: Internal Medicine

## 2015-05-16 VITALS — BP 128/60 | HR 60 | Ht 66.75 in | Wt 192.5 lb

## 2015-05-16 DIAGNOSIS — Z7901 Long term (current) use of anticoagulants: Secondary | ICD-10-CM | POA: Diagnosis not present

## 2015-05-16 DIAGNOSIS — R1310 Dysphagia, unspecified: Secondary | ICD-10-CM | POA: Diagnosis not present

## 2015-05-16 DIAGNOSIS — I482 Chronic atrial fibrillation, unspecified: Secondary | ICD-10-CM

## 2015-05-16 NOTE — Patient Instructions (Signed)
  You have been scheduled for an endoscopy. Please follow written instructions given to you at your visit today. If you use inhalers (even only as needed), please bring them with you on the day of your procedure.   You will be contaced by our office prior to your procedure for directions on holding your xarelto.  If you do not hear from our office 1 week prior to your scheduled procedure, please call [phone] to discuss.   I appreciate the opportunity to care for you. Silvano Rusk, MD, [facility]

## 2015-05-16 NOTE — Telephone Encounter (Signed)
Pt has severe carotid disease and refused any consideration for intervention. Hold xarelto for 36 - 48 hrs

## 2015-05-16 NOTE — Telephone Encounter (Signed)
[facility] [address] [place] [address]  05/16/2015   RE: Gregory [name] DOB: [birth year] MRN: [id number]   Dear Dr Ellouise Newer,    We have scheduled the above patient for an endoscopic procedure. Our records show that he is on anticoagulation therapy.   Please advise as to how long the patient may come off his therapy of xarelto prior to the EGD procedure, which is scheduled for 05/22/15.  Please fax back/ or route the completed form to Soriya Worster [place] at [phone].   Sincerely,    Silvano Rusk, MD, [facility]

## 2015-05-16 NOTE — Telephone Encounter (Signed)
Gregory [name] the daughter informed to hold Dad's xarelto for 24 hours and she verbalized understanding.

## 2015-05-16 NOTE — Telephone Encounter (Signed)
24hrs

## 2015-05-16 NOTE — Progress Notes (Signed)
Subjective:    Patient ID: Gregory [name], male    DOB: 1932/04/21, 82 y.o.   MRN: [id number] Cc: dysphagia HPI The patient is an 82 year old white man with a history of chronic recurrent dysphagia, a food impaction requiring endoscopy to relieve it in November 2013. At that time he had an inflammatory stricture of the GE junction but it was not dilated. He does take a PPI but he has chronic recurrent solid food dysphagia. It sounds like he manages food impactions on his own at times as well. No unintentional weight loss. He does takes Xarelto for a history of atrial fibrillation. Also has acid reflux though it sounds like it may be fairly well controlled on his PPI. He is here with his daughter today. He has congestive heart failure, he does limit his activity to a degree but remains fairly active despite having a low ejection fraction. No other GI problems reported. Allergies  Allergen Reactions  . Percocet [Oxycodone-Acetaminophen] Itching and Nausea Only   Outpatient Prescriptions Prior to Visit  Medication Sig Dispense Refill  . acetaminophen (TYLENOL) 500 MG tablet Take 500 mg by mouth every 6 (six) hours as needed.    Marland Kitchen aspirin EC 81 MG tablet Take 81 mg by mouth daily.    . carvedilol (COREG) 3.125 MG tablet TAKE 3 TABLETS BY MOUTH TWICE DAILY WITH A MEAL 180 tablet 9  . furosemide (LASIX) 40 MG tablet Take 0.5 tablets (20 mg total) by mouth daily. 30 tablet 5  . lisinopril (PRINIVIL,ZESTRIL) 10 MG tablet Take 1.5 tablets (15 mg total) by mouth daily. 45 tablet 6  . pantoprazole (PROTONIX) 40 MG tablet TAKE 1 TABLET BY MOUTH EVERY DAY 90 tablet 3  . rivaroxaban (XARELTO) 20 MG TABS tablet Take 1 tablet (20 mg total) by mouth daily with supper. 30 tablet 3  . rosuvastatin (CRESTOR) 40 MG tablet Take 1 tablet (40 mg total) by mouth daily. 30 tablet 6  . traMADol (ULTRAM) 50 MG tablet Take a 1/2 tablet every 8 hours as needed for pain 30 tablet 0   No facility-administered medications  prior to visit.   Past Medical History  Diagnosis Date  . CAD (coronary artery disease),CABG 1993-LIMA to the LAD, SVG to Om and SVG to PDA/PLA, patent on cath 2008. 2003  . CHF (congestive heart failure) ([place])   . Heart murmur   . Atrial fibrillation ([place])     permanent  . Hypertension   . High cholesterol   . Heart attack ([place]) 1980's  . Pneumonia     "in the last 5 yrs" (01/22/2013)  . GERD (gastroesophageal reflux disease)   . Stroke [facility])     "slightly drags left foot since; recovered qthing else" (01/21/2013)  . Kidney stones     "passed all but one time when he had to have lithotripsy" (01/21/2013)  . Cardiomyopathy ([place]) 2008    EF 20% but now improved to 40-45%  . 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 SSS (sick sinus syndrome) ([place])   . Pulmonary hypertension ([place])   . Arthritis    Past Surgical History  Procedure Laterality Date  . Laparoscopic appendectomy  03/30/2011    Procedure: APPENDECTOMY LAPAROSCOPIC;  Surgeon: Joyice Faster. Cornett, MD;  Location: [facility] OR;  Service: General;  Laterality: N/A;  . Cataract extraction w/ intraocular lens implant Bilateral 2013  . Esophagogastroduodenoscopy  02/01/2012    Procedure: ESOPHAGOGASTRODUODENOSCOPY (EGD);  Surgeon: Beryle Beams, MD;  Location: [facility];  Service: Endoscopy;  Laterality: N/A;  . Coronary artery bypass graft  03/01/2002    LIMA to LAD, reverse SVG to OM, reverse SVG to PDA of RCA, reverse SVG to PLA of RCA, ligation of LA appendage (Dr. Servando Snare)  . Lithotripsy      "once" (01/21/2013)  . Tee without cardioversion N/A 01/23/2013    Procedure: TRANSESOPHAGEAL ECHOCARDIOGRAM (TEE);  Surgeon: Sanda Klein, MD;  Location: [facility] ENDOSCOPY;  Service: Cardiovascular;  Laterality: N/A;  . Transthoracic echocardiogram  06/23/2012    EF 40-45%, mild LVH; mild AV regurg; mild MV regurg; LV mod-severely dilated; RV mildly dilated; systolic pressure borderline increased; RA mod dilated  . Nm myocar perf wall motion  05/2009     persantine myoview - fixed moderate perfusion defect in inferior wall & lateral segment of apex (poor non-transmural infarction), minimal anterolateral periinfarct reversible ischemia seen, abnormal study, defects similar to 2006 study  . Coronary angioplasty  07/05/2006    3 vessel CAD, patent LIMA to LAD, patentVG to OM, patent SVG to PDA & PLA, mild MR, severe LV systolic dysfunction (Dr. Gerrie Nordmann)  . Cardiac catheterization  02/24/2002    reduced LV function, 60-70% prox RCA stenosis, 70% PLA ostial stenosis, 80% secondary branch of PLA stenosis - subsequent CABG (Dr. Jackie Plum)  . Carotid doppler  06/2012    50-69% right bulb/prox ICA diameter reduction; 70-99% left bulb/prox ICA diameter reduction  . Splenectomy, total  01/2013  . Left heart catheterization with coronary angiogram N/A 01/24/2013    Procedure: LEFT HEART CATHETERIZATION WITH CORONARY ANGIOGRAM;  Surgeon: [name], MD;  Location: [facility] CATH LAB;  Service: Cardiovascular;  Laterality: N/A;  Right heart with grafts   Social History   Social History  . Marital Status: Married    Spouse Name: N/A  . Number of Children: 62  . Years of Education: 8   Occupational History  . retired    Social History Main Topics  . Smoking status: Former Smoker -- 2.00 packs/day for 20 years    Types: Cigarettes    Quit date: 04/08/1986  . Smokeless tobacco: Current User    Types: Chew  . Alcohol Use: No  . Drug Use: No  . Sexual Activity: Not Asked   Other Topics Concern  . None   Social History Narrative   A she is married, 5 sons 4 daughters. He is retired Horticulturist, commercial. 2-3 caffeinated beverages a day no alcohol   Family History  Problem Relation Age of Onset  . Heart disease Father     rheumatic fever  . Heart attack Brother 69  . Stroke Mother   . Stroke Brother 88    Review of Systems As per history of present illness. Complains of arthritis pains and some low back pain. All other review of systems negative.      Objective:   Physical Exam @BP  128/60 mmHg  Pulse 60  Ht 5' 6.75" (1.695 m)  Wt 192 lb 8 oz (87.317 kg)  BMI 30.39 kg/m2@  General:  Well-developed, well-nourished and in no acute distress Eyes:  anicteric. ENT:   Mouth and posterior pharynx free of lesions.  Neck:   supple w/o thyromegaly or mass.  Lungs: Clear to auscultation bilaterally. Heart:  S1S2, no rubs, murmurs, gallops. Abdomen:  soft, non-tender, no hepatosplenomegaly, hernia, or mass and BS+.  Lymph:  no cervical or supraclavicular adenopathy. Extremities:   no edema, cyanosis or clubbing Neuro:  A&O x 3.  Psych:  appropriate mood and  Affect.   Data Reviewed: EGD, 2013, Dr. Benson Norway as reported in the history of present illness CT scan 02/02/2013 abdomen and pelvis showing splenic infarction. He also has a hiatal hernia though it is small. I reviewed Dec. 2016 cardiology notes     Assessment & Plan:   1. Dysphagia   2. Anticoagulated on Xarelto   3. Chronic atrial fibrillation (HCC)    Dysphagia is most likely coming from a peptic stricture related to reflux esophagitis as seen in 2013 EGD. He may need a higher dose of PPI but will continue on one PPI a day at this point and schedule upper GI endoscopy. It is best to hold his Xarelto, he does have risks of embolic phenomenon from that and I reviewed that with him but we will hold his Xarelto for 24 hours before. His cardiologist has reviewed that and finds that acceptable as well. Try to get him back on within a day.The risks and benefits as well as alternatives of endoscopic procedure(s) have been discussed and reviewed. All questions answered. The patient agrees to proceed. Given his heart failure and is withholding anticoagulation is a higher risk patient. His procedure will be performed at the hospital where there is a greater safety net.

## 2015-05-16 NOTE — Telephone Encounter (Signed)
Please advise time to hold Sir?

## 2015-05-20 ENCOUNTER — Telehealth: Payer: Self-pay | Admitting: Family Medicine

## 2015-05-20 DIAGNOSIS — M25551 Pain in right hip: Secondary | ICD-10-CM

## 2015-05-20 DIAGNOSIS — M25552 Pain in left hip: Principal | ICD-10-CM

## 2015-05-20 MED ORDER — TRAMADOL HCL 50 MG PO TABS: ORAL_TABLET | ORAL | Status: DC

## 2015-05-20 NOTE — Telephone Encounter (Signed)
Pharmacy: [id number] - [place], [place] - [address] AT [address]  Reason for call: Pt is out of tramadol. He uses it prn.

## 2015-05-22 ENCOUNTER — Encounter (HOSPITAL_COMMUNITY): Payer: Self-pay | Admitting: *Deleted

## 2015-05-22 ENCOUNTER — Encounter (HOSPITAL_COMMUNITY)
Admission: RE | Disposition: A | Discharge status: Home / Self Care | Payer: Self-pay | Source: Ambulatory Visit | Attending: Internal Medicine

## 2015-05-22 ENCOUNTER — Ambulatory Visit (HOSPITAL_COMMUNITY)
Admission: RE | Admit: 2015-05-22 | Discharge: 2015-05-22 | Disposition: A | Discharge status: Home / Self Care | Payer: Medicare Other | Source: Ambulatory Visit | Attending: Internal Medicine | Admitting: Internal Medicine

## 2015-05-22 DIAGNOSIS — I509 Heart failure, unspecified: Secondary | ICD-10-CM | POA: Diagnosis not present

## 2015-05-22 DIAGNOSIS — R1310 Dysphagia, unspecified: Secondary | ICD-10-CM | POA: Diagnosis present

## 2015-05-22 DIAGNOSIS — I272 Other secondary pulmonary hypertension: Secondary | ICD-10-CM | POA: Diagnosis not present

## 2015-05-22 DIAGNOSIS — I69398 Other sequelae of cerebral infarction: Secondary | ICD-10-CM | POA: Insufficient documentation

## 2015-05-22 DIAGNOSIS — Z7982 Long term (current) use of aspirin: Secondary | ICD-10-CM | POA: Insufficient documentation

## 2015-05-22 DIAGNOSIS — I252 Old myocardial infarction: Secondary | ICD-10-CM | POA: Insufficient documentation

## 2015-05-22 DIAGNOSIS — Z951 Presence of aortocoronary bypass graft: Secondary | ICD-10-CM | POA: Insufficient documentation

## 2015-05-22 DIAGNOSIS — K449 Diaphragmatic hernia without obstruction or gangrene: Secondary | ICD-10-CM | POA: Diagnosis not present

## 2015-05-22 DIAGNOSIS — R1319 Other dysphagia: Secondary | ICD-10-CM | POA: Diagnosis present

## 2015-05-22 DIAGNOSIS — E7800 Pure hypercholesterolemia, unspecified: Secondary | ICD-10-CM | POA: Diagnosis not present

## 2015-05-22 DIAGNOSIS — Z7901 Long term (current) use of anticoagulants: Secondary | ICD-10-CM | POA: Insufficient documentation

## 2015-05-22 DIAGNOSIS — Z79899 Other long term (current) drug therapy: Secondary | ICD-10-CM | POA: Insufficient documentation

## 2015-05-22 DIAGNOSIS — I2510 Atherosclerotic heart disease of native coronary artery without angina pectoris: Secondary | ICD-10-CM | POA: Diagnosis not present

## 2015-05-22 DIAGNOSIS — Z87891 Personal history of nicotine dependence: Secondary | ICD-10-CM | POA: Diagnosis not present

## 2015-05-22 DIAGNOSIS — Z9081 Acquired absence of spleen: Secondary | ICD-10-CM | POA: Diagnosis not present

## 2015-05-22 DIAGNOSIS — I110 Hypertensive heart disease with heart failure: Secondary | ICD-10-CM | POA: Insufficient documentation

## 2015-05-22 DIAGNOSIS — I495 Sick sinus syndrome: Secondary | ICD-10-CM | POA: Insufficient documentation

## 2015-05-22 DIAGNOSIS — Z9861 Coronary angioplasty status: Secondary | ICD-10-CM | POA: Insufficient documentation

## 2015-05-22 DIAGNOSIS — Z9049 Acquired absence of other specified parts of digestive tract: Secondary | ICD-10-CM | POA: Diagnosis not present

## 2015-05-22 DIAGNOSIS — I482 Chronic atrial fibrillation: Secondary | ICD-10-CM | POA: Insufficient documentation

## 2015-05-22 DIAGNOSIS — K222 Esophageal obstruction: Secondary | ICD-10-CM | POA: Diagnosis present

## 2015-05-22 HISTORY — PX: ESOPHAGOGASTRODUODENOSCOPY: SHX5428

## 2015-05-22 HISTORY — PX: BALLOON DILATION: SHX5330

## 2015-05-22 SURGERY — EGD (ESOPHAGOGASTRODUODENOSCOPY)
Anesthesia: Moderate Sedation

## 2015-05-22 MED ORDER — FENTANYL CITRATE (PF) 100 MCG/2ML IJ SOLN
INTRAMUSCULAR | Status: DC | PRN
  Administered 2015-05-22: 25 ug via INTRAVENOUS

## 2015-05-22 MED ORDER — BUTAMBEN-TETRACAINE-BENZOCAINE 2-2-14 % EX AERO
INHALATION_SPRAY | CUTANEOUS | Status: DC | PRN
  Administered 2015-05-22: 2 via TOPICAL

## 2015-05-22 MED ORDER — SODIUM CHLORIDE 0.9 % IV SOLN
INTRAVENOUS | Status: DC
  Administered 2015-05-22: 500 mL via INTRAVENOUS

## 2015-05-22 MED ORDER — MIDAZOLAM HCL 10 MG/2ML IJ SOLN
INTRAMUSCULAR | Status: DC | PRN
  Administered 2015-05-22: 2 mg via INTRAVENOUS
  Administered 2015-05-22: 1 mg via INTRAVENOUS

## 2015-05-22 MED ORDER — MIDAZOLAM HCL 5 MG/ML IJ SOLN
INTRAMUSCULAR | Status: AC
  Filled 2015-05-22: qty 2

## 2015-05-22 MED ORDER — FENTANYL CITRATE (PF) 100 MCG/2ML IJ SOLN
INTRAMUSCULAR | Status: AC
  Filled 2015-05-22: qty 2

## 2015-05-22 MED ORDER — SODIUM CHLORIDE 0.9 % IV SOLN: INTRAVENOUS | Status: DC

## 2015-05-22 MED ORDER — RIVAROXABAN 20 MG PO TABS: 20.0000 mg | ORAL_TABLET | Freq: Every day | ORAL | Status: DC

## 2015-05-22 NOTE — H&P (View-Only) (Signed)
Subjective:    Patient ID: Gregory [name], male    DOB: 1932/05/22, 82 y.o.   MRN: [id number] Cc: dysphagia HPI The patient is an 82 year old white man with a history of chronic recurrent dysphagia, a food impaction requiring endoscopy to relieve it in November 2013. At that time he had an inflammatory stricture of the GE junction but it was not dilated. He does take a PPI but he has chronic recurrent solid food dysphagia. It sounds like he manages food impactions on his own at times as well. No unintentional weight loss. He does takes Xarelto for a history of atrial fibrillation. Also has acid reflux though it sounds like it may be fairly well controlled on his PPI. He is here with his daughter today. He has congestive heart failure, he does limit his activity to a degree but remains fairly active despite having a low ejection fraction. No other GI problems reported. Allergies  Allergen Reactions  . Percocet [Oxycodone-Acetaminophen] Itching and Nausea Only   Outpatient Prescriptions Prior to Visit  Medication Sig Dispense Refill  . acetaminophen (TYLENOL) 500 MG tablet Take 500 mg by mouth every 6 (six) hours as needed.    Marland Kitchen aspirin EC 81 MG tablet Take 81 mg by mouth daily.    . carvedilol (COREG) 3.125 MG tablet TAKE 3 TABLETS BY MOUTH TWICE DAILY WITH A MEAL 180 tablet 9  . furosemide (LASIX) 40 MG tablet Take 0.5 tablets (20 mg total) by mouth daily. 30 tablet 5  . lisinopril (PRINIVIL,ZESTRIL) 10 MG tablet Take 1.5 tablets (15 mg total) by mouth daily. 45 tablet 6  . pantoprazole (PROTONIX) 40 MG tablet TAKE 1 TABLET BY MOUTH EVERY DAY 90 tablet 3  . rivaroxaban (XARELTO) 20 MG TABS tablet Take 1 tablet (20 mg total) by mouth daily with supper. 30 tablet 3  . rosuvastatin (CRESTOR) 40 MG tablet Take 1 tablet (40 mg total) by mouth daily. 30 tablet 6  . traMADol (ULTRAM) 50 MG tablet Take a 1/2 tablet every 8 hours as needed for pain 30 tablet 0   No facility-administered medications  prior to visit.   Past Medical History  Diagnosis Date  . CAD (coronary artery disease),CABG 1993-LIMA to the LAD, SVG to Om and SVG to PDA/PLA, patent on cath 2008. 2003  . CHF (congestive heart failure) ([place])   . Heart murmur   . Atrial fibrillation ([place])     permanent  . Hypertension   . High cholesterol   . Heart attack ([place]) 1980's  . Pneumonia     "in the last 5 yrs" (01/22/2013)  . GERD (gastroesophageal reflux disease)   . Stroke [facility])     "slightly drags left foot since; recovered qthing else" (01/21/2013)  . Kidney stones     "passed all but one time when he had to have lithotripsy" (01/21/2013)  . Cardiomyopathy ([place]) 2008    EF 20% but now improved to 40-45%  . 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 SSS (sick sinus syndrome) ([place])   . Pulmonary hypertension ([place])   . Arthritis    Past Surgical History  Procedure Laterality Date  . Laparoscopic appendectomy  03/30/2011    Procedure: APPENDECTOMY LAPAROSCOPIC;  Surgeon: Joyice Faster. Cornett, MD;  Location: [facility];  Service: General;  Laterality: N/A;  . Cataract extraction w/ intraocular lens implant Bilateral 2013  . Esophagogastroduodenoscopy  02/01/2012    Procedure: ESOPHAGOGASTRODUODENOSCOPY (EGD);  Surgeon: Beryle Beams, MD;  Location: [facility];  Service: Endoscopy;  Laterality: N/A;  . Coronary artery bypass graft  03/01/2002    LIMA to LAD, reverse SVG to OM, reverse SVG to PDA of RCA, reverse SVG to PLA of RCA, ligation of LA appendage (Dr. Servando Snare)  . Lithotripsy      "once" (01/21/2013)  . Tee without cardioversion N/A 01/23/2013    Procedure: TRANSESOPHAGEAL ECHOCARDIOGRAM (TEE);  Surgeon: Sanda Klein, MD;  Location: [facility] ENDOSCOPY;  Service: Cardiovascular;  Laterality: N/A;  . Transthoracic echocardiogram  06/23/2012    EF 40-45%, mild LVH; mild AV regurg; mild MV regurg; LV mod-severely dilated; RV mildly dilated; systolic pressure borderline increased; RA mod dilated  . Nm myocar perf wall motion  05/2009     persantine myoview - fixed moderate perfusion defect in inferior wall & lateral segment of apex (poor non-transmural infarction), minimal anterolateral periinfarct reversible ischemia seen, abnormal study, defects similar to 2006 study  . Coronary angioplasty  07/05/2006    3 vessel CAD, patent LIMA to LAD, patentVG to OM, patent SVG to PDA & PLA, mild MR, severe LV systolic dysfunction (Dr. Gerrie Nordmann)  . Cardiac catheterization  02/24/2002    reduced LV function, 60-70% prox RCA stenosis, 70% PLA ostial stenosis, 80% secondary branch of PLA stenosis - subsequent CABG (Dr. Jackie Plum)  . Carotid doppler  06/2012    50-69% right bulb/prox ICA diameter reduction; 70-99% left bulb/prox ICA diameter reduction  . Splenectomy, total  01/2013  . Left heart catheterization with coronary angiogram N/A 01/24/2013    Procedure: LEFT HEART CATHETERIZATION WITH CORONARY ANGIOGRAM;  Surgeon: [name], MD;  Location: [facility] CATH LAB;  Service: Cardiovascular;  Laterality: N/A;  Right heart with grafts   Social History   Social History  . Marital Status: Married    Spouse Name: N/A  . Number of Children: 87  . Years of Education: 8   Occupational History  . retired    Social History Main Topics  . Smoking status: Former Smoker -- 2.00 packs/day for 20 years    Types: Cigarettes    Quit date: 04/08/1986  . Smokeless tobacco: Current User    Types: Chew  . Alcohol Use: No  . Drug Use: No  . Sexual Activity: Not Asked   Other Topics Concern  . None   Social History Narrative   A she is married, 5 sons 4 daughters. He is retired Horticulturist, commercial. 2-3 caffeinated beverages a day no alcohol   Family History  Problem Relation Age of Onset  . Heart disease Father     rheumatic fever  . Heart attack Brother 79  . Stroke Mother   . Stroke Brother 20    Review of Systems As per history of present illness. Complains of arthritis pains and some low back pain. All other review of systems negative.      Objective:   Physical Exam @BP  128/60 mmHg  Pulse 60  Ht 5' 6.75" (1.695 m)  Wt 192 lb 8 oz (87.317 kg)  BMI 30.39 kg/m2@  General:  Well-developed, well-nourished and in no acute distress Eyes:  anicteric. ENT:   Mouth and posterior pharynx free of lesions.  Neck:   supple w/o thyromegaly or mass.  Lungs: Clear to auscultation bilaterally. Heart:  S1S2, no rubs, murmurs, gallops. Abdomen:  soft, non-tender, no hepatosplenomegaly, hernia, or mass and BS+.  Lymph:  no cervical or supraclavicular adenopathy. Extremities:   no edema, cyanosis or clubbing Neuro:  A&O x 3.  Psych:  appropriate mood and  Affect.   Data Reviewed: EGD, 2013, Dr. Benson Norway as reported in the history of present illness CT scan 02/02/2013 abdomen and pelvis showing splenic infarction. He also has a hiatal hernia though it is small. I reviewed Dec. 2016 cardiology notes     Assessment & Plan:   1. Dysphagia   2. Anticoagulated on Xarelto   3. Chronic atrial fibrillation (HCC)    Dysphagia is most likely coming from a peptic stricture related to reflux esophagitis as seen in 2013 EGD. He may need a higher dose of PPI but will continue on one PPI a day at this point and schedule upper GI endoscopy. It is best to hold his Xarelto, he does have risks of embolic phenomenon from that and I reviewed that with him but we will hold his Xarelto for 24 hours before. His cardiologist has reviewed that and finds that acceptable as well. Try to get him back on within a day.The risks and benefits as well as alternatives of endoscopic procedure(s) have been discussed and reviewed. All questions answered. The patient agrees to proceed. Given his heart failure and is withholding anticoagulation is a higher risk patient. His procedure will be performed at the hospital where there is a greater safety net.

## 2015-05-22 NOTE — Op Note (Signed)
[facility] Patient Name: Gregory [name] Procedure Date: 05/22/2015 MRN: [id number] Attending MD: Gatha Mayer , MD Date of Birth: 1932-10-20 CSN:  Age: 82 Admit Type: Outpatient Account #: [id number] Procedure:            Upper GI endoscopy Indications:          Esophageal dysphagia Providers:            Gatha Mayer, MD, Malka So, RN, Cletis Athens,                        Technician Referring MD:          Medicines:            Midazolam 3 mg IV, Fentanyl 25 micrograms IV, Cetacaine                        spray Complications:        No immediate complications. Estimated Blood Loss: Estimated blood loss was minimal. Procedure:            Pre-Anesthesia Assessment:                       - Prior to the procedure, a History and Physical was                        performed, and patient medications and allergies were                        reviewed. The patient's tolerance of previous                        anesthesia was also reviewed. The risks and benefits of                        the procedure and the sedation options and risks were                        discussed with the patient. All questions were                        answered, and informed consent was obtained. Prior                        Anticoagulants: The patient last took Eliquis                        (apixaban) 2 days prior to the procedure. ASA Grade                        Assessment: III - A patient with severe systemic                        disease. After reviewing the risks and benefits, the                        patient was deemed in satisfactory condition to undergo                        the procedure.  After obtaining informed consent, the endoscope was                        passed under direct vision. Throughout the procedure,                        the patient's blood pressure, pulse, and oxygen                        saturations were monitored continuously.  The EG-[id number]                        [id number]) scope was introduced through the mouth, and                        advanced to the second part of duodenum. The upper GI                        endoscopy was accomplished with ease. The patient                        tolerated the procedure well. The upper GI endoscopy                        was accomplished with ease. The patient tolerated the                        procedure well. Scope In: Scope Out: Findings:      One moderate benign-appearing, intrinsic stenosis was found. This       measured 1.4 cm (inner diameter) and was traversed. A TTS dilator was       passed through the scope. Dilation with a 15-16.5-18 mm balloon dilator       was performed.      A small hiatal hernia was present.      The exam was otherwise without abnormality. Impression:           - Benign-appearing esophageal stenosis. Dilated.                       - Small hiatal hernia.                       - The examination was otherwise normal.                       - No specimens collected. Moderate Sedation:      Moderate (conscious) sedation was administered by the endoscopy nurse       and supervised by the endoscopist. The following parameters were       monitored: oxygen saturation, heart rate, blood pressure, respiratory       rate, EKG, adequacy of pulmonary ventilation, and response to care.       Total physician intraservice time was 10 minutes. Recommendation:       - Patient has a contact number available for                        emergencies. The signs and symptoms of potential                        delayed  complications were discussed with the patient.                        Return to normal activities tomorrow. Written discharge                        instructions were provided to the patient.                       - Clear liquid diet for 1 hour.                       - Soft diet for 1 day.                       - Resume regular diet starting  tomorrow.                       - Resume Xarelto (rivaroxaban) at prior dose tomorrow. Procedure Code(s):    --- Professional ---                       [phone], Esophagogastroduodenoscopy, flexible, transoral;                        with transendoscopic balloon dilation of esophagus                        (less than 30 mm diameter)                       G0500, Moderate sedation services provided by the same                        physician or other qualified health care professional                        performing a gastrointestinal endoscopic service that                        sedation supports, requiring the presence of an                        independent trained observer to assist in the                        monitoring of the patient's level of consciousness and                        physiological status; initial 15 minutes of                        intra-service time; patient age 78 years or older                        (additional time may be reported with [phone], as                        appropriate) Diagnosis Code(s):    --- Professional ---                       K22.2, Esophageal obstruction  K44.9, Diaphragmatic hernia without obstruction or                        gangrene                       R13.14, Dysphagia, pharyngoesophageal phase CPT copyright 2016 American Medical Association. All rights reserved. The codes documented in this report are preliminary and upon coder review may  be revised to meet current compliance requirements. Attending Participation: Gatha Mayer, MD Gatha Mayer, MD 05/22/2015 10:42:49 AM Number of Addenda: 0

## 2015-05-22 NOTE — Interval H&P Note (Signed)
History and Physical Interval Note:  05/22/2015 10:10 AM  Gregory [name]  has presented today for surgery, with the diagnosis of dysphagia  The various methods of treatment have been discussed with the patient and family. After consideration of risks, benefits and other options for treatment, the patient has consented to  Procedure(s): ESOPHAGOGASTRODUODENOSCOPY (EGD) (N/A) BALLOON DILATION (N/A) as a surgical intervention .  The patient's history has been reviewed, patient examined, no change in status, stable for surgery.  I have reviewed the patient's chart and labs.  Questions were answered to the patient's satisfaction.     Silvano Rusk

## 2015-05-22 NOTE — Discharge Instructions (Signed)
°  YOU HAD AN ENDOSCOPIC PROCEDURE TODAY: Refer to the procedure report and other information in the discharge instructions given to you for any specific questions about what was found during the examination. If this information does not answer your questions, please call Dr. Celesta Aver office at [phone] to clarify.   YOU SHOULD EXPECT: Some feelings of bloating in the abdomen. Passage of more gas than usual. Walking can help get rid of the air that was put into your GI tract during the procedure and reduce the bloating. If you had a lower endoscopy (such as a colonoscopy or flexible sigmoidoscopy) you may notice spotting of blood in your stool or on the toilet paper. Some abdominal soreness may be present for a day or two, also.  DIET: Stay on clear liquids until 1130 then soft foods rest of day. Normal diet tomorrow.  ACTIVITY: Your care partner should take you home directly after the procedure. You should plan to take it easy, moving slowly for the rest of the day. You can resume normal activity the day after the procedure however YOU SHOULD NOT DRIVE, use power tools, machinery or perform tasks that involve climbing or major physical exertion for 24 hours (because of the sedation medicines used during the test).   SYMPTOMS TO REPORT IMMEDIATELY: A gastroenterologist can be reached at any hour. Please call [phone]  for any of the following symptoms:  Following lower endoscopy (colonoscopy, flexible sigmoidoscopy) Excessive amounts of blood in the stool  Significant tenderness, worsening of abdominal pains  Swelling of the abdomen that is new, acute  Fever of 100 or higher  Following upper endoscopy (EGD, EUS, ERCP, esophageal dilation) Vomiting of blood or coffee ground material  New, significant abdominal pain  New, significant chest pain or pain under the shoulder blades  Painful or persistently difficult swallowing  New shortness of breath  Black, tarry-looking or red, bloody  stools  FOLLOW UP:  If any biopsies were taken you will be contacted by phone or by letter within the next 1-3 weeks. Call [phone]  if you have not heard about the biopsies in 3 weeks.  Please also call with any specific questions about appointments or follow up tests.

## 2015-05-29 ENCOUNTER — Other Ambulatory Visit: Payer: Self-pay | Admitting: Family Medicine

## 2015-05-29 DIAGNOSIS — M533 Sacrococcygeal disorders, not elsewhere classified: Principal | ICD-10-CM

## 2015-05-29 DIAGNOSIS — G8929 Other chronic pain: Secondary | ICD-10-CM

## 2015-06-10 DIAGNOSIS — M545 Low back pain: Secondary | ICD-10-CM | POA: Diagnosis not present

## 2015-06-12 DIAGNOSIS — M545 Low back pain: Secondary | ICD-10-CM | POA: Diagnosis not present

## 2015-06-26 ENCOUNTER — Other Ambulatory Visit: Payer: Self-pay | Admitting: Cardiovascular Disease

## 2015-06-26 NOTE — Telephone Encounter (Signed)
Rx(s) sent to pharmacy electronically.  

## 2015-06-27 DIAGNOSIS — M47817 Spondylosis without myelopathy or radiculopathy, lumbosacral region: Secondary | ICD-10-CM | POA: Diagnosis not present

## 2015-06-27 DIAGNOSIS — M4806 Spinal stenosis, lumbar region: Secondary | ICD-10-CM | POA: Diagnosis not present

## 2015-06-27 DIAGNOSIS — M545 Low back pain: Secondary | ICD-10-CM | POA: Diagnosis not present

## 2015-07-01 ENCOUNTER — Telehealth: Payer: Self-pay | Admitting: Cardiovascular Disease

## 2015-07-01 NOTE — Telephone Encounter (Signed)
Andee Poles called in stating that a fax was sent over on 4/13 in regards to a pt holding his Xarelto so he can receive a lumbar epidural steroid injection. This has not been scheduled as of yet. Awaiting further instruction from the doctor. Please fax this form back to [phone].   Thank you

## 2015-07-04 NOTE — Telephone Encounter (Signed)
Reviewed with Dr. Claiborne Billings, pt has history of CVA, splenic infarct and chronic AF.  Ok to hold Xarelto x 3 days for procedure.  Form faxed to Orthopedics

## 2015-07-15 DIAGNOSIS — R55 Syncope and collapse: Secondary | ICD-10-CM

## 2015-07-15 HISTORY — DX: Syncope and collapse: R55

## 2015-07-18 ENCOUNTER — Telehealth: Payer: Self-pay | Admitting: Family Medicine

## 2015-07-18 ENCOUNTER — Other Ambulatory Visit: Payer: Self-pay

## 2015-07-18 DIAGNOSIS — M25552 Pain in left hip: Principal | ICD-10-CM

## 2015-07-18 DIAGNOSIS — M25551 Pain in right hip: Secondary | ICD-10-CM

## 2015-07-18 MED ORDER — TRAMADOL HCL 50 MG PO TABS: ORAL_TABLET | ORAL | Status: DC

## 2015-07-18 NOTE — Telephone Encounter (Signed)
Caller name: [name] Relationship to patient: daughter Can be reached: [phone] Pharmacy: [id number] - [place], [place]  Reason for call: Pt out of tramadol. Takes prn. Ortho appt scheduled next Wednesday.

## 2015-07-23 ENCOUNTER — Other Ambulatory Visit: Payer: Self-pay | Admitting: Family Medicine

## 2015-07-23 NOTE — Telephone Encounter (Signed)
Caller name: [name]  Relationship to patient: daughter  Can be reached: [phone]  Pharmacy: [id number] - [place], [place]  Pt daughter called pharmacy and they stated they did not receive RX for tramadol. Please resend

## 2015-07-24 ENCOUNTER — Other Ambulatory Visit: Payer: Self-pay | Admitting: Family Medicine

## 2015-07-24 DIAGNOSIS — M545 Low back pain: Secondary | ICD-10-CM | POA: Diagnosis not present

## 2015-07-24 DIAGNOSIS — M47817 Spondylosis without myelopathy or radiculopathy, lumbosacral region: Secondary | ICD-10-CM | POA: Diagnosis not present

## 2015-07-24 DIAGNOSIS — M4806 Spinal stenosis, lumbar region: Secondary | ICD-10-CM | POA: Diagnosis not present

## 2015-07-24 MED ORDER — TRAMADOL HCL 50 MG PO TABS: ORAL_TABLET | ORAL | Status: DC

## 2015-07-24 NOTE — Telephone Encounter (Signed)
Taken care of

## 2015-07-24 NOTE — Telephone Encounter (Signed)
Tramadol called in to patients parmacy.

## 2015-07-29 ENCOUNTER — Other Ambulatory Visit: Payer: Self-pay | Admitting: Cardiovascular Disease

## 2015-07-29 NOTE — Telephone Encounter (Signed)
Rx request sent to pharmacy.  

## 2015-07-30 ENCOUNTER — Emergency Department (HOSPITAL_COMMUNITY): Payer: Medicare Other

## 2015-07-30 ENCOUNTER — Observation Stay (HOSPITAL_COMMUNITY)
Admission: EM | Admit: 2015-07-30 | Discharge: 2015-08-02 | Disposition: A | Discharge status: Home / Self Care | Payer: Medicare Other | Attending: Internal Medicine | Admitting: Internal Medicine

## 2015-07-30 ENCOUNTER — Encounter (HOSPITAL_COMMUNITY): Payer: Self-pay | Admitting: Emergency Medicine

## 2015-07-30 ENCOUNTER — Ambulatory Visit: Payer: Medicare Other | Admitting: Internal Medicine

## 2015-07-30 DIAGNOSIS — E7800 Pure hypercholesterolemia, unspecified: Secondary | ICD-10-CM | POA: Insufficient documentation

## 2015-07-30 DIAGNOSIS — Z7901 Long term (current) use of anticoagulants: Secondary | ICD-10-CM | POA: Diagnosis not present

## 2015-07-30 DIAGNOSIS — W01198A Fall on same level from slipping, tripping and stumbling with subsequent striking against other object, initial encounter: Secondary | ICD-10-CM | POA: Insufficient documentation

## 2015-07-30 DIAGNOSIS — Z8701 Personal history of pneumonia (recurrent): Secondary | ICD-10-CM | POA: Insufficient documentation

## 2015-07-30 DIAGNOSIS — R55 Syncope and collapse: Secondary | ICD-10-CM | POA: Diagnosis not present

## 2015-07-30 DIAGNOSIS — Z951 Presence of aortocoronary bypass graft: Secondary | ICD-10-CM | POA: Insufficient documentation

## 2015-07-30 DIAGNOSIS — Y92009 Unspecified place in unspecified non-institutional (private) residence as the place of occurrence of the external cause: Secondary | ICD-10-CM | POA: Insufficient documentation

## 2015-07-30 DIAGNOSIS — N280 Ischemia and infarction of kidney: Secondary | ICD-10-CM | POA: Diagnosis present

## 2015-07-30 DIAGNOSIS — I480 Paroxysmal atrial fibrillation: Secondary | ICD-10-CM | POA: Diagnosis not present

## 2015-07-30 DIAGNOSIS — Z8673 Personal history of transient ischemic attack (TIA), and cerebral infarction without residual deficits: Secondary | ICD-10-CM | POA: Diagnosis not present

## 2015-07-30 DIAGNOSIS — S51012A Laceration without foreign body of left elbow, initial encounter: Secondary | ICD-10-CM | POA: Insufficient documentation

## 2015-07-30 DIAGNOSIS — I2510 Atherosclerotic heart disease of native coronary artery without angina pectoris: Secondary | ICD-10-CM | POA: Insufficient documentation

## 2015-07-30 DIAGNOSIS — I6529 Occlusion and stenosis of unspecified carotid artery: Secondary | ICD-10-CM | POA: Diagnosis present

## 2015-07-30 DIAGNOSIS — Y998 Other external cause status: Secondary | ICD-10-CM | POA: Insufficient documentation

## 2015-07-30 DIAGNOSIS — N200 Calculus of kidney: Secondary | ICD-10-CM | POA: Insufficient documentation

## 2015-07-30 DIAGNOSIS — R000 Tachycardia, unspecified: Secondary | ICD-10-CM | POA: Diagnosis not present

## 2015-07-30 DIAGNOSIS — I482 Chronic atrial fibrillation, unspecified: Secondary | ICD-10-CM | POA: Diagnosis present

## 2015-07-30 DIAGNOSIS — N289 Disorder of kidney and ureter, unspecified: Secondary | ICD-10-CM | POA: Diagnosis not present

## 2015-07-30 DIAGNOSIS — I509 Heart failure, unspecified: Secondary | ICD-10-CM | POA: Diagnosis not present

## 2015-07-30 DIAGNOSIS — N179 Acute kidney failure, unspecified: Secondary | ICD-10-CM | POA: Diagnosis not present

## 2015-07-30 DIAGNOSIS — S0181XA Laceration without foreign body of other part of head, initial encounter: Secondary | ICD-10-CM | POA: Diagnosis not present

## 2015-07-30 DIAGNOSIS — I10 Essential (primary) hypertension: Secondary | ICD-10-CM | POA: Diagnosis not present

## 2015-07-30 DIAGNOSIS — S0121XA Laceration without foreign body of nose, initial encounter: Secondary | ICD-10-CM | POA: Diagnosis not present

## 2015-07-30 DIAGNOSIS — Z7982 Long term (current) use of aspirin: Secondary | ICD-10-CM | POA: Insufficient documentation

## 2015-07-30 DIAGNOSIS — R1032 Left lower quadrant pain: Secondary | ICD-10-CM | POA: Diagnosis not present

## 2015-07-30 DIAGNOSIS — S0990XA Unspecified injury of head, initial encounter: Secondary | ICD-10-CM | POA: Diagnosis not present

## 2015-07-30 DIAGNOSIS — S199XXA Unspecified injury of neck, initial encounter: Secondary | ICD-10-CM | POA: Diagnosis not present

## 2015-07-30 DIAGNOSIS — S0993XA Unspecified injury of face, initial encounter: Secondary | ICD-10-CM | POA: Diagnosis not present

## 2015-07-30 DIAGNOSIS — Z87891 Personal history of nicotine dependence: Secondary | ICD-10-CM | POA: Diagnosis not present

## 2015-07-30 DIAGNOSIS — M1990 Unspecified osteoarthritis, unspecified site: Secondary | ICD-10-CM | POA: Diagnosis not present

## 2015-07-30 DIAGNOSIS — Y9301 Activity, walking, marching and hiking: Secondary | ICD-10-CM | POA: Diagnosis not present

## 2015-07-30 DIAGNOSIS — G8929 Other chronic pain: Secondary | ICD-10-CM | POA: Insufficient documentation

## 2015-07-30 DIAGNOSIS — Z79899 Other long term (current) drug therapy: Secondary | ICD-10-CM | POA: Insufficient documentation

## 2015-07-30 DIAGNOSIS — M25531 Pain in right wrist: Secondary | ICD-10-CM | POA: Diagnosis not present

## 2015-07-30 HISTORY — DX: Acute appendicitis with perforation and localized peritonitis, with abscess: K35.33

## 2015-07-30 HISTORY — DX: Syncope and collapse: R55

## 2015-07-30 LAB — CBC WITH DIFFERENTIAL/PLATELET
Basophils Absolute: 0 10*3/uL (ref 0.0–0.1)
Basophils Relative: 0 %
EOS ABS: 0 10*3/uL (ref 0.0–0.7)
EOS PCT: 0 %
HCT: 39.5 % (ref 39.0–52.0)
HEMOGLOBIN: 13 g/dL (ref 13.0–17.0)
LYMPHS ABS: 2 10*3/uL (ref 0.7–4.0)
LYMPHS PCT: 16 %
MCH: 28.3 pg (ref 26.0–34.0)
MCHC: 32.9 g/dL (ref 30.0–36.0)
MCV: 86.1 fL (ref 78.0–100.0)
MONOS PCT: 13 %
Monocytes Absolute: 1.6 10*3/uL — ABNORMAL HIGH (ref 0.1–1.0)
Neutro Abs: 8.8 10*3/uL — ABNORMAL HIGH (ref 1.7–7.7)
Neutrophils Relative %: 71 %
Platelets: 214 10*3/uL (ref 150–400)
RBC: 4.59 MIL/uL (ref 4.22–5.81)
RDW: 15.7 % — ABNORMAL HIGH (ref 11.5–15.5)
WBC: 12.5 10*3/uL — AB (ref 4.0–10.5)

## 2015-07-30 LAB — URINE MICROSCOPIC-ADD ON: Squamous Epithelial / LPF: NONE SEEN

## 2015-07-30 LAB — BASIC METABOLIC PANEL
Anion gap: 10 (ref 5–15)
BUN: 23 mg/dL — AB (ref 6–20)
CHLORIDE: 99 mmol/L — AB (ref 101–111)
CO2: 27 mmol/L (ref 22–32)
CREATININE: 1.83 mg/dL — AB (ref 0.61–1.24)
Calcium: 8.9 mg/dL (ref 8.9–10.3)
GFR calc Af Amer: 38 mL/min — ABNORMAL LOW (ref >60)
GFR calc non Af Amer: 33 mL/min — ABNORMAL LOW (ref >60)
Glucose, Bld: 107 mg/dL — ABNORMAL HIGH (ref 65–99)
POTASSIUM: 4 mmol/L (ref 3.5–5.1)
SODIUM: 136 mmol/L (ref 135–145)

## 2015-07-30 LAB — URINALYSIS, ROUTINE W REFLEX MICROSCOPIC
Bilirubin Urine: NEGATIVE
GLUCOSE, UA: NEGATIVE mg/dL
Ketones, ur: NEGATIVE mg/dL
LEUKOCYTES UA: NEGATIVE
Nitrite: NEGATIVE
PH: 5.5 (ref 5.0–8.0)
PROTEIN: 30 mg/dL — AB
Specific Gravity, Urine: 1.017 (ref 1.005–1.030)

## 2015-07-30 LAB — TROPONIN I: Troponin I: <0.03 ng/mL (ref <0.031)

## 2015-07-30 LAB — I-STAT TROPONIN, ED: Troponin i, poc: 0.01 ng/mL (ref 0.00–0.08)

## 2015-07-30 MED ORDER — TRAMADOL HCL 50 MG PO TABS
50.0000 mg | ORAL_TABLET | Freq: Four times a day (QID) | ORAL | Status: DC | PRN
  Administered 2015-07-30 – 2015-07-31 (×2): 50 mg via ORAL
  Filled 2015-07-30 (×2): qty 1

## 2015-07-30 MED ORDER — CARVEDILOL 3.125 MG PO TABS
3.1250 mg | ORAL_TABLET | Freq: Two times a day (BID) | ORAL | Status: DC
  Administered 2015-07-31 – 2015-08-02 (×5): 3.125 mg via ORAL
  Filled 2015-07-30 (×6): qty 1

## 2015-07-30 MED ORDER — ROSUVASTATIN CALCIUM 10 MG PO TABS
40.0000 mg | ORAL_TABLET | Freq: Every day | ORAL | Status: DC
  Administered 2015-07-31 – 2015-08-02 (×3): 40 mg via ORAL
  Filled 2015-07-30 (×3): qty 4

## 2015-07-30 MED ORDER — PANTOPRAZOLE SODIUM 40 MG PO TBEC
40.0000 mg | DELAYED_RELEASE_TABLET | Freq: Every day | ORAL | Status: DC
  Administered 2015-07-31 – 2015-08-02 (×3): 40 mg via ORAL
  Filled 2015-07-30 (×3): qty 1

## 2015-07-30 MED ORDER — IOPAMIDOL (ISOVUE-300) INJECTION 61%
INTRAVENOUS | Status: AC
  Administered 2015-07-30: 75 mL
  Filled 2015-07-30: qty 100

## 2015-07-30 MED ORDER — FUROSEMIDE 20 MG PO TABS
20.0000 mg | ORAL_TABLET | Freq: Every day | ORAL | Status: DC
  Administered 2015-07-31 – 2015-08-02 (×3): 20 mg via ORAL
  Filled 2015-07-30 (×3): qty 1

## 2015-07-30 MED ORDER — SODIUM CHLORIDE 0.9% FLUSH
3.0000 mL | Freq: Two times a day (BID) | INTRAVENOUS | Status: DC
  Administered 2015-07-31 – 2015-08-01 (×2): 3 mL via INTRAVENOUS

## 2015-07-30 MED ORDER — RIVAROXABAN 20 MG PO TABS
20.0000 mg | ORAL_TABLET | Freq: Every day | ORAL | Status: DC
  Administered 2015-07-30 – 2015-07-31 (×2): 20 mg via ORAL
  Filled 2015-07-30 (×2): qty 1

## 2015-07-30 MED ORDER — HYDROCODONE-ACETAMINOPHEN 5-325 MG PO TABS
2.0000 | ORAL_TABLET | Freq: Once | ORAL | Status: AC
Start: 2015-07-30 — End: 2015-07-30
  Administered 2015-07-30: 2 via ORAL
  Filled 2015-07-30: qty 2

## 2015-07-30 MED ORDER — SODIUM CHLORIDE 0.9 % IV SOLN
Freq: Once | INTRAVENOUS | Status: AC
  Administered 2015-07-30: 16:00:00 via INTRAVENOUS

## 2015-07-30 MED ORDER — ASPIRIN EC 81 MG PO TBEC
81.0000 mg | DELAYED_RELEASE_TABLET | Freq: Every day | ORAL | Status: DC
  Administered 2015-07-31 – 2015-08-02 (×3): 81 mg via ORAL
  Filled 2015-07-30 (×3): qty 1

## 2015-07-30 NOTE — ED Provider Notes (Signed)
CSN: [id number]     Arrival date & time 07/30/15  1232 History   First MD Initiated Contact with Patient 07/30/15 1235     Chief Complaint  Patient presents with  . Fall  . Loss of Consciousness    HPI   Gregory [name] is an 82 y.o. male with history of CAD, CHF, afib on Xarelto, HTN, HLD, stroke, CM who presents to the ED for evaluation after a syncopal episode. He states the was feeling nauseated all morning today. Denies emesis. States he was walking out the door when the next thing he remembers is being face down on the concrete. Pt's family members say they did not witness the event but they heard him fall. Pt states that until today he has been in his usual state of health. He states he did have an epidural steroid injection last week for chronic back pain and skipped several doses of his Xarelto prior to that procedure but resumed his normal Xarelto regimen about five days ago. He states now he has pain in the right side of his face and in his left elbow where he has a large skin tear. Denies any chest pain or SOB. Denies history of recent syncope or recent falls. He states the last time something like this happened he was diagnosed with a blood clot to his spleen. He is s/p splenectomy.   Cardiologist: Shelva Majestic, MD PCP: Lamar Blinks, MD [facility])  Past Medical History  Diagnosis Date  . CAD (coronary artery disease),CABG 1993-LIMA to the LAD, SVG to Om and SVG to PDA/PLA, patent on cath 2008. 2003  . CHF (congestive heart failure) ([place])   . Heart murmur   . Atrial fibrillation ([place])     permanent  . Hypertension   . High cholesterol   . Heart attack ([place]) 1980's  . Pneumonia     "in the last 5 yrs" (01/22/2013)  . GERD (gastroesophageal reflux disease)   . Stroke [facility])     "slightly drags left foot since; recovered qthing else" (01/21/2013)  . Kidney stones     "passed all but one time when he had to have lithotripsy" (01/21/2013)  . Cardiomyopathy ([place]) 2008    EF 20%  but now improved to 40-45%  . 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 SSS (sick sinus syndrome) ([place])   . Pulmonary hypertension ([place])   . Arthritis    Past Surgical History  Procedure Laterality Date  . Laparoscopic appendectomy  03/30/2011    Procedure: APPENDECTOMY LAPAROSCOPIC;  Surgeon: Joyice Faster. Cornett, MD;  Location: [place] OR;  Service: General;  Laterality: N/A;  . Cataract extraction w/ intraocular lens implant Bilateral 2013  . Esophagogastroduodenoscopy  02/01/2012    Procedure: ESOPHAGOGASTRODUODENOSCOPY (EGD);  Surgeon: Beryle Beams, MD;  Location: [facility];  Service: Endoscopy;  Laterality: N/A;  . Coronary artery bypass graft  03/01/2002    LIMA to LAD, reverse SVG to OM, reverse SVG to PDA of RCA, reverse SVG to PLA of RCA, ligation of LA appendage (Dr. Servando Snare)  . Lithotripsy      "once" (01/21/2013)  . Tee without cardioversion N/A 01/23/2013    Procedure: TRANSESOPHAGEAL ECHOCARDIOGRAM (TEE);  Surgeon: Sanda Klein, MD;  Location: [facility] ENDOSCOPY;  Service: Cardiovascular;  Laterality: N/A;  . Transthoracic echocardiogram  06/23/2012    EF 40-45%, mild LVH; mild AV regurg; mild MV regurg; LV mod-severely dilated; RV mildly dilated; systolic pressure borderline increased; RA mod dilated  . Nm myocar perf wall motion  05/2009  persantine myoview - fixed moderate perfusion defect in inferior wall & lateral segment of apex (poor non-transmural infarction), minimal anterolateral periinfarct reversible ischemia seen, abnormal study, defects similar to 2006 study  . Coronary angioplasty  07/05/2006    3 vessel CAD, patent LIMA to LAD, patentVG to OM, patent SVG to PDA & PLA, mild MR, severe LV systolic dysfunction (Dr. Gerrie Nordmann)  . Cardiac catheterization  02/24/2002    reduced LV function, 60-70% prox RCA stenosis, 70% PLA ostial stenosis, 80% secondary branch of PLA stenosis - subsequent CABG (Dr. Jackie Plum)  . Carotid doppler  06/2012    50-69% right bulb/prox ICA diameter reduction;  70-99% left bulb/prox ICA diameter reduction  . Splenectomy, total  01/2013  . Left heart catheterization with coronary angiogram N/A 01/24/2013    Procedure: LEFT HEART CATHETERIZATION WITH CORONARY ANGIOGRAM;  Surgeon: [name], MD;  Location: [facility] CATH LAB;  Service: Cardiovascular;  Laterality: N/A;  Right heart with grafts  . Esophagogastroduodenoscopy N/A 05/22/2015    Procedure: ESOPHAGOGASTRODUODENOSCOPY (EGD);  Surgeon: Gatha Mayer, MD;  Location: [facility];  Service: Endoscopy;  Laterality: N/A;  . Balloon dilation N/A 05/22/2015    Procedure: BALLOON DILATION;  Surgeon: Gatha Mayer, MD;  Location: [facility] ENDOSCOPY;  Service: Endoscopy;  Laterality: N/A;   Family History  Problem Relation Age of Onset  . Heart disease Father     rheumatic fever  . Heart attack Brother 23  . Stroke Mother   . Stroke Brother 52   Social History  Substance Use Topics  . Smoking status: Former Smoker -- 2.00 packs/day for 20 years    Types: Cigarettes    Quit date: 04/08/1986  . Smokeless tobacco: Current User    Types: Chew  . Alcohol Use: No    Review of Systems  All other systems reviewed and are negative.     Allergies  Percocet  Home Medications   Prior to Admission medications   Medication Sig Start Date End Date Taking? Authorizing Provider  acetaminophen (TYLENOL) 500 MG tablet Take 500 mg by mouth every 6 (six) hours as needed.    Historical Provider, MD  aspirin EC 81 MG tablet Take 81 mg by mouth daily.    Historical Provider, MD  carvedilol (COREG) 3.125 MG tablet TAKE 3 TABLETS BY MOUTH TWICE DAILY WITH MEALS 07/29/15   Troy Sine, MD  furosemide (LASIX) 40 MG tablet Take 0.5 tablets (20 mg total) by mouth daily. 08/20/14   Troy Sine, MD  lisinopril (PRINIVIL,ZESTRIL) 10 MG tablet TAKE 1 AND 1/2 TABLETS(15 MG) BY MOUTH DAILY 06/26/15   Troy Sine, MD  pantoprazole (PROTONIX) 40 MG tablet TAKE 1 TABLET BY MOUTH EVERY DAY 12/03/14   Troy Sine, MD   rivaroxaban (XARELTO) 20 MG TABS tablet Take 1 tablet (20 mg total) by mouth daily with supper. Resume 05/23/2015 05/22/15   Gatha Mayer, MD  rosuvastatin (CRESTOR) 40 MG tablet Take 1 tablet (40 mg total) by mouth daily. 03/06/15   Troy Sine, MD  traMADol Veatrice Bourbon) 50 MG tablet Take a 1/2 tablet every 8 hours as needed for pain 07/24/15   [name] [name], MD   BP 159/74 mmHg  Pulse 67  Temp(Src) 97.8 F (36.6 C) (Oral)  Resp 20  SpO2 100% Physical Exam  Constitutional: He is oriented to person, place, and time. Cervical collar in place.  HENT:  Right Ear: External ear normal.  Left Ear: External ear normal.  Mouth/Throat: Oropharynx is clear and moist. No oropharyngeal exudate.  Superficial laceration to bridge of nose. Bleeding well controlled. Dried blood around right nare.   Right maxillary tenderness. Mild edema. Mild erythema. No left sided tenderness. No crepitus.  FROM of jaw. No trismus. No intraoral lesions.   Eyes: Conjunctivae and EOM are normal. Pupils are equal, round, and reactive to light.  Neck:  In c-collar. No c-spine tenderness  Cardiovascular: Normal rate and intact distal pulses.  An irregularly irregular rhythm present.  Murmur heard. Pulmonary/Chest: Effort normal and breath sounds normal. No respiratory distress. He has no wheezes.  Abdominal: Soft. Bowel sounds are normal. He exhibits no distension. There is no tenderness. There is no rebound and no guarding.  Musculoskeletal: He exhibits no edema.  right wrist with dorsal/ulnar swelling. Mildly diffusely ttp.  Left posterior elbow with 4-5cm skin tear. Bleeding controlled.  No t-spine or l-spine tenderness. No stepoff or deformity  No clavicular, chest wall, hip, or lower extremity tenderness. No gross injury or deformity.   Neurological: He is alert and oriented to person, place, and time. No cranial nerve deficit.  Normal finger to nose. No pronator drift.  Skin: Skin is warm and dry.   Psychiatric: He has a normal mood and affect.  Nursing note and vitals reviewed.   ED Course  Procedures (including critical care time) Labs Review Labs Reviewed  BASIC METABOLIC PANEL - Abnormal; Notable for the following:    Chloride 99 (*)    Glucose, Bld 107 (*)    BUN 23 (*)    Creatinine, Ser 1.83 (*)    GFR calc non Af Amer 33 (*)    GFR calc Af Amer 38 (*)    All other components within normal limits  CBC WITH DIFFERENTIAL/PLATELET - Abnormal; Notable for the following:    WBC 12.5 (*)    RDW 15.7 (*)    Neutro Abs 8.8 (*)    Monocytes Absolute 1.6 (*)    All other components within normal limits  URINALYSIS, ROUTINE W REFLEX MICROSCOPIC (NOT AT [facility])  Randolm Idol, ED    Imaging Review Dg Chest 2 View  07/30/2015  CLINICAL DATA:  Syncope with fall EXAM: CHEST  2 VIEW COMPARISON:  February 09, 2014 FINDINGS: Lungs are clear. Heart is enlarged with pulmonary vascularity within normal limits. Patient is status post coronary artery bypass grafting. No adenopathy. No bone lesions. There is degenerative change in the lower thoracic and upper lumbar spine regions. IMPRESSION: Cardiomegaly.  No edema or consolidation. Electronically Signed   By: Lowella Grip III M.D.   On: 07/30/2015 13:53   Dg Elbow Complete Left  07/30/2015  CLINICAL DATA:  [place] today.  Syncope.  Skin tear on the elbow. EXAM: LEFT ELBOW - COMPLETE 3+ VIEW COMPARISON:  None. FINDINGS: Negative for a fracture or dislocation. Mild spurring and degenerative changes in the proximal ulna. Mild spurring or enthesopathic changes involving the lateral humeral epicondyle. Irregularity of the soft tissue along the lateral aspect of the elbow. IMPRESSION: No acute bone abnormality to the left elbow. Electronically Signed   By: Markus Daft M.D.   On: 07/30/2015 13:59   Dg Wrist Complete Right  07/30/2015  CLINICAL DATA:  Right wrist pain, fell today onto cement EXAM: RIGHT WRIST - COMPLETE 3+ VIEW COMPARISON:  None.  FINDINGS: Four views of the right wrist submitted. No acute fracture or subluxation. There is degenerative narrowing of radiocarpal joint space. Atherosclerotic vascular calcifications are noted. Erosive degenerative changes  are noted triquetrum. IMPRESSION: No acute fracture or subluxation. Degenerative changes as described above. Electronically Signed   By: Lahoma Crocker M.D.   On: 07/30/2015 14:08   Ct Head Wo Contrast  07/30/2015  CLINICAL DATA:  Syncope and fall.  Facial laceration. EXAM: CT HEAD WITHOUT CONTRAST CT MAXILLOFACIAL WITHOUT CONTRAST CT CERVICAL SPINE WITHOUT CONTRAST TECHNIQUE: Multidetector CT imaging of the head, cervical spine, and maxillofacial structures were performed using the standard protocol without intravenous contrast. Multiplanar CT image reconstructions of the cervical spine and maxillofacial structures were also generated. COMPARISON:  CT scan of February 19, 2004. FINDINGS: CT HEAD FINDINGS Bony calvarium appears intact. Mild chronic ischemic white matter disease is noted. Old right basal ganglia infarction is noted and stable. No mass effect or midline shift is noted. Ventricular size is within normal limits. There is no evidence of mass lesion, hemorrhage or acute infarction. CT MAXILLOFACIAL FINDINGS Globes and orbits appear normal. Paranasal sinuses appear normal. No fracture or other bony abnormality is noted. Pterygoid plates appear normal. CT CERVICAL SPINE FINDINGS No fracture is noted. Minimal grade 1 anterolisthesis of C4-5 is noted secondary to posterior facet joint hypertrophy. Disc spaces are well-maintained. Visualized lung apices appear intact. IMPRESSION: Mild chronic ischemic white matter disease. Old right basal ganglia infarction. No acute intracranial abnormality seen. No abnormality seen in the maxillofacial region. Minimal grade 1 anterolisthesis of C4-5 is noted secondary to posterior facet joint hypertrophy. No acute abnormality seen in the cervical spine.  Electronically Signed   By: Marijo Conception, M.D.   On: 07/30/2015 13:50   Ct Cervical Spine Wo Contrast  07/30/2015  CLINICAL DATA:  Syncope and fall.  Facial laceration. EXAM: CT HEAD WITHOUT CONTRAST CT MAXILLOFACIAL WITHOUT CONTRAST CT CERVICAL SPINE WITHOUT CONTRAST TECHNIQUE: Multidetector CT imaging of the head, cervical spine, and maxillofacial structures were performed using the standard protocol without intravenous contrast. Multiplanar CT image reconstructions of the cervical spine and maxillofacial structures were also generated. COMPARISON:  CT scan of February 19, 2004. FINDINGS: CT HEAD FINDINGS Bony calvarium appears intact. Mild chronic ischemic white matter disease is noted. Old right basal ganglia infarction is noted and stable. No mass effect or midline shift is noted. Ventricular size is within normal limits. There is no evidence of mass lesion, hemorrhage or acute infarction. CT MAXILLOFACIAL FINDINGS Globes and orbits appear normal. Paranasal sinuses appear normal. No fracture or other bony abnormality is noted. Pterygoid plates appear normal. CT CERVICAL SPINE FINDINGS No fracture is noted. Minimal grade 1 anterolisthesis of C4-5 is noted secondary to posterior facet joint hypertrophy. Disc spaces are well-maintained. Visualized lung apices appear intact. IMPRESSION: Mild chronic ischemic white matter disease. Old right basal ganglia infarction. No acute intracranial abnormality seen. No abnormality seen in the maxillofacial region. Minimal grade 1 anterolisthesis of C4-5 is noted secondary to posterior facet joint hypertrophy. No acute abnormality seen in the cervical spine. Electronically Signed   By: Marijo Conception, M.D.   On: 07/30/2015 13:50   Ct Abdomen Pelvis W Contrast  07/30/2015  CLINICAL DATA:  Left lower quadrant pain for 1 day EXAM: CT ABDOMEN AND PELVIS WITH CONTRAST TECHNIQUE: Multidetector CT imaging of the abdomen and pelvis was performed using the standard protocol  following bolus administration of intravenous contrast. CONTRAST:  40mL ISOVUE-300 IOPAMIDOL (ISOVUE-300) INJECTION 61% COMPARISON:  01/20/2013 FINDINGS: Lung bases are free of acute infiltrate or sizable effusion. The liver, gallbladder, adrenal glands and pancreas are within normal limits. The spleen demonstrates significant scarring consistent  with the history of prior splenic infarction. The right kidney is well visualized and again demonstrates a renal cyst. No obstructive changes are seen. The left kidney demonstrates mottled enhancement consistent with renal infarction. On the coronal reconstructions on image number 91 of series 5 there is a filling defect identified in the distal left renal artery. Aortoiliac calcifications are noted without aneurysmal dilatation. The appendix has been surgically removed. Scattered diverticular change is noted without evidence of diverticulitis. The bladder is well distended. A fat containing left inguinal hernia is noted. No pelvic mass lesion is seen. The osseous structures show degenerative changes of the lumbar spine. 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 Scarring in the spleen consistent with prior splenic infarct. Chronic changes without acute abnormality. Critical Value/emergent results were called by telephone at the time of interpretation on 07/30/2015 at 3:39 pm to [facility], [facility] , who verbally acknowledged these results. Electronically Signed   By: Inez Catalina M.D.   On: 07/30/2015 15:42   Ct Maxillofacial Wo Cm  07/30/2015  CLINICAL DATA:  Syncope and fall.  Facial laceration. EXAM: CT HEAD WITHOUT CONTRAST CT MAXILLOFACIAL WITHOUT CONTRAST CT CERVICAL SPINE WITHOUT CONTRAST TECHNIQUE: Multidetector CT imaging of the head, cervical spine, and maxillofacial structures were performed using the standard protocol without intravenous contrast. Multiplanar CT image reconstructions of the cervical  spine and maxillofacial structures were also generated. COMPARISON:  CT scan of February 19, 2004. FINDINGS: CT HEAD FINDINGS Bony calvarium appears intact. Mild chronic ischemic white matter disease is noted. Old right basal ganglia infarction is noted and stable. No mass effect or midline shift is noted. Ventricular size is within normal limits. There is no evidence of mass lesion, hemorrhage or acute infarction. CT MAXILLOFACIAL FINDINGS Globes and orbits appear normal. Paranasal sinuses appear normal. No fracture or other bony abnormality is noted. Pterygoid plates appear normal. CT CERVICAL SPINE FINDINGS No fracture is noted. Minimal grade 1 anterolisthesis of C4-5 is noted secondary to posterior facet joint hypertrophy. Disc spaces are well-maintained. Visualized lung apices appear intact. IMPRESSION: Mild chronic ischemic white matter disease. Old right basal ganglia infarction. No acute intracranial abnormality seen. No abnormality seen in the maxillofacial region. Minimal grade 1 anterolisthesis of C4-5 is noted secondary to posterior facet joint hypertrophy. No acute abnormality seen in the cervical spine. Electronically Signed   By: Marijo Conception, M.D.   On: 07/30/2015 13:50   I have personally reviewed and evaluated these images and lab results as part of my medical decision-making.   EKG Interpretation None      MDM   Final diagnoses:  None    Imaging negative for acute findings. bloodwork pending. EKG in afib. On reassessment pt states he continues to feel sore but declines pain meds. He states he forgot to mention that earlier this morning he had sharp LLQ pain that radiated down in to his groin. States now his abdomen is a bit sore but not as bad as it was earlier. He states he does not know if this is why he was nauseated earlier. On repeat exam he continues to have a benign abdominal exam with no tenderness or guarding. No hernia palpable. Will obtain CT abd/pelvis given history of  severe abdominal pain (now improved), nausea, and syncope today.  I did discuss with pt's family that I anticipate he will be admitted to medicine given syncopal episode and his risk factors. He is agreeable with this plan.  Labs now resulted and reveal a Cr increased from baseline, 1.83. I discussed with CT and will do reduced dose of IV contrast. Labs otherwise with mild leukocytosis of 12.5  CT abd/pelvis significant for L renal infarct. AKI as above. Pt is already on Xarelto. Medicine is consulted for admission. Will start some gentle IV fluid hydration.  Spoke to [place] from hospitalist service. She states that pt's pcp is actually [place]. Will consult unassigned internal medicine.  At end of shift I still have not received a call back from IM. Will sign out to oncoming team, Alisia Ferrari PA-C.  Anne Ng, PA-C 07/30/15 2152  Harvel Quale, MD 08/11/15 [phone]

## 2015-07-30 NOTE — H&P (Signed)
[facility] History and Physical  Gregory [name] [id number] DOB: June 02, 1932 DOA: 07/30/2015  Referring physician:  PCP: Lamar Blinks, MD  Specialists:   Chief Complaint: syncope   HPI: Gregory [name] is a 82 y.o. male with PMH of CAD h/o CABG, CHF (EF 20%-2008. delined ICD), Sinus Node dysfunction,  PAF (could not tolerate coumadin) complicate with splenic infarction, started on xarelto due to large left appendage thrombus (2014) presented today with syncope. He states feeling nauseated this morning, developed left sided back and LLQ abdominal pains. He walked out to the garage and had a syncopal episode for few seconds, fell face down. His family was present did not reports any seizure. He denies any focal neuro weakness or paresthesia, no chest pains, no SOB, no dizziness. But reports mild nausea, and left sided abdominal pain prior to the event. He reports not taking xarelto for 3 days last week due to scheduled spinal steroid injection .  -ED: CT abd showed left renal infarction   Review of Systems: The patient denies anorexia, fever, weight loss,, vision loss, decreased hearing, hoarseness, chest pain, syncope, dyspnea on exertion, peripheral edema, balance deficits, hemoptysis, abdominal pain, melena, hematochezia, severe indigestion/heartburn, hematuria, incontinence, genital sores, muscle weakness, suspicious skin lesions, transient blindness, difficulty walking, depression, unusual weight change, abnormal bleeding, enlarged lymph nodes, angioedema, and breast masses.    Past Medical History  Diagnosis Date  . CAD (coronary artery disease),CABG 1993-LIMA to the LAD, SVG to Om and SVG to PDA/PLA, patent on cath 2008. 2003  . CHF (congestive heart failure) ([place])   . Heart murmur   . Atrial fibrillation ([place])     permanent  . Hypertension   . High cholesterol   . Heart [place] ([place]) 1980's  . Pneumonia     "in the last 5 yrs" (01/22/2013)  . GERD (gastroesophageal reflux  disease)   . Stroke [facility])     "slightly drags left foot since; recovered qthing else" (01/21/2013)  . Kidney stones     "passed all but one time when he had to have lithotripsy" (01/21/2013)  . Cardiomyopathy ([place]) 2008    EF 20% but now improved to 40-45%  . 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 SSS (sick sinus syndrome) ([place])   . Pulmonary hypertension ([place])   . Arthritis    Past Surgical History  Procedure Laterality Date  . Laparoscopic appendectomy  03/30/2011    Procedure: APPENDECTOMY LAPAROSCOPIC;  Surgeon: Joyice Faster. Cornett, MD;  Location: [facility] OR;  Service: General;  Laterality: N/A;  . Cataract extraction w/ intraocular lens implant Bilateral 2013  . Esophagogastroduodenoscopy  02/01/2012    Procedure: ESOPHAGOGASTRODUODENOSCOPY (EGD);  Surgeon: Beryle Beams, MD;  Location: [facility];  Service: Endoscopy;  Laterality: N/A;  . Coronary artery bypass graft  03/01/2002    LIMA to LAD, reverse SVG to OM, reverse SVG to PDA of RCA, reverse SVG to PLA of RCA, ligation of LA appendage (Dr. Servando Snare)  . Lithotripsy      "once" (01/21/2013)  . Tee without cardioversion N/A 01/23/2013    Procedure: TRANSESOPHAGEAL ECHOCARDIOGRAM (TEE);  Surgeon: Sanda Klein, MD;  Location: [facility] ENDOSCOPY;  Service: Cardiovascular;  Laterality: N/A;  . Transthoracic echocardiogram  06/23/2012    EF 40-45%, mild LVH; mild AV regurg; mild MV regurg; LV mod-severely dilated; RV mildly dilated; systolic pressure borderline increased; RA mod dilated  . Nm myocar perf wall motion  05/2009    persantine myoview - fixed moderate perfusion defect in inferior wall & lateral  segment of apex (poor non-transmural infarction), minimal anterolateral periinfarct reversible ischemia seen, abnormal study, defects similar to 2006 study  . Coronary angioplasty  07/05/2006    3 vessel CAD, patent LIMA to LAD, patentVG to OM, patent SVG to PDA & PLA, mild MR, severe LV systolic dysfunction (Dr. Gerrie Nordmann)  . Cardiac catheterization   02/24/2002    reduced LV function, 60-70% prox RCA stenosis, 70% PLA ostial stenosis, 80% secondary branch of PLA stenosis - subsequent CABG (Dr. Jackie Plum)  . Carotid doppler  06/2012    50-69% right bulb/prox ICA diameter reduction; 70-99% left bulb/prox ICA diameter reduction  . Splenectomy, total  01/2013  . Left heart catheterization with coronary angiogram N/A 01/24/2013    Procedure: LEFT HEART CATHETERIZATION WITH CORONARY ANGIOGRAM;  Surgeon: [name], MD;  Location[facility] CATH LAB;  Service: Cardiovascular;  Laterality: N/A;  Right heart with grafts  . Esophagogastroduodenoscopy N/A 05/22/2015    Procedure: ESOPHAGOGASTRODUODENOSCOPY (EGD);  Surgeon: Gatha Mayer, MD;  Location: [facility] END[facility];  Service: Endoscopy;  Laterality: N/A;  . Balloon dilation N/A 05/22/2015    Procedure: BALLOON DILATION;  Surgeon: Gatha Mayer, MD;  Location: [facility] ENDOSCOPY;  Service: Endoscopy;  Laterality: N/A;   Social History:  reports that he quit smoking about 29 years ago. His smoking use included Cigarettes. He has a 40 pack-year smoking history. His smokeless tobacco use includes Chew. He reports that he does not drink alcohol or use illicit drugs. Home;  where does patient live--home, ALF, SNF? and with whom if at home? Yes;  Can patient participate in ADLs?  Allergies  Allergen Reactions  . Percocet [Oxycodone-Acetaminophen] Itching and Nausea Only    Family History  Problem Relation Age of Onset  . Heart disease Father     rheumatic fever  . Heart attack Brother 20  . Stroke Mother   . Stroke Brother 43    (be sure to complete)  Prior to Admission medications   Medication Sig Start Date End Date Taking? Authorizing Provider  acetaminophen (TYLENOL) 500 MG tablet Take 500 mg by mouth every 6 (six) hours as needed for mild pain.    Yes Historical Provider, MD  aspirin EC 81 MG tablet Take 81 mg by mouth daily.   Yes Historical Provider, MD  carvedilol (COREG) 3.125 MG tablet TAKE 3  TABLETS BY MOUTH TWICE DAILY WITH MEALS 07/29/15  Yes Troy Sine, MD  furosemide (LASIX) 40 MG tablet Take 0.5 tablets (20 mg total) by mouth daily. 08/20/14  Yes Troy Sine, MD  lisinopril (PRINIVIL,ZESTRIL) 10 MG tablet TAKE 1 AND 1/2 TABLETS(15 MG) BY MOUTH DAILY 06/26/15  Yes Troy Sine, MD  pantoprazole (PROTONIX) 40 MG tablet TAKE 1 TABLET BY MOUTH EVERY DAY 12/03/14  Yes Troy Sine, MD  rivaroxaban (XARELTO) 20 MG TABS tablet Take 1 tablet (20 mg total) by mouth daily with supper. Resume 05/23/2015 05/22/15  Yes Gatha Mayer, MD  rosuvastatin (CRESTOR) 40 MG tablet Take 1 tablet (40 mg total) by mouth daily. 03/06/15  Yes Troy Sine, MD  traMADol (ULTRAM) 50 MG tablet Take a 1/2 tablet every 8 hours as needed for pain Patient taking differently: Take 25 mg by mouth every 8 (eight) hours as needed for moderate pain. Take a 1/2 tablet every 8 hours as needed for pain 07/24/15  Yes Darreld Mclean, MD   Physical Exam: Filed Vitals:   07/30/15 1715 07/30/15 1730  BP: 129/58 150/70  Pulse:  69 60  Temp:    Resp: 13 20     General:  Alert, no distress   Eyes: EOM-I, perrla; frontal scalp laceration  ENT: no rash   Neck: supple   Cardiovascular: s1,s2 irregular   Respiratory: CTA BL  Abdomen: soft, nt,nd   Skin: lacerations   Musculoskeletal: no leg edema   Psychiatric: no hallucinations   Neurologic: CN 2-12 intact; motor 5/5 BL   Labs on Admission:  Basic Metabolic Panel:  Recent Labs Lab 07/30/15 1400  NA 136  K 4.0  CL 99*  CO2 27  GLUCOSE 107*  BUN 23*  CREATININE 1.83*  CALCIUM 8.9   Liver Function Tests: No results for input(s): AST, ALT, ALKPHOS, BILITOT, PROT, ALBUMIN in the last 168 hours. No results for input(s): LIPASE, AMYLASE in the last 168 hours. No results for input(s): AMMONIA in the last 168 hours. CBC:  Recent Labs Lab 07/30/15 1400  WBC 12.5*  NEUTROABS 8.8*  HGB 13.0  HCT 39.5  MCV 86.1  PLT 214   Cardiac  Enzymes: No results for input(s): CKTOTAL, CKMB, CKMBINDEX, TROPONINI in the last 168 hours.  BNP (last 3 results) No results for input(s): BNP in the last 8760 hours.  ProBNP (last 3 results) No results for input(s): PROBNP in the last 8760 hours.  CBG: No results for input(s): GLUCAP in the last 168 hours.  Radiological Exams on Admission: Dg Chest 2 View  07/30/2015  CLINICAL DATA:  Syncope with fall EXAM: CHEST  2 VIEW COMPARISON:  February 09, 2014 FINDINGS: Lungs are clear. Heart is enlarged with pulmonary vascularity within normal limits. Patient is status post coronary artery bypass grafting. No adenopathy. No bone lesions. There is degenerative change in the lower thoracic and upper lumbar spine regions. IMPRESSION: Cardiomegaly.  No edema or consolidation. Electronically Signed   By: Lowella Grip III M.D.   On: 07/30/2015 13:53   Dg Elbow Complete Left  07/30/2015  CLINICAL DATA:  [place] today.  Syncope.  Skin tear on the elbow. EXAM: LEFT ELBOW - COMPLETE 3+ VIEW COMPARISON:  None. FINDINGS: Negative for a fracture or dislocation. Mild spurring and degenerative changes in the proximal ulna. Mild spurring or enthesopathic changes involving the lateral humeral epicondyle. Irregularity of the soft tissue along the lateral aspect of the elbow. IMPRESSION: No acute bone abnormality to the left elbow. Electronically Signed   By: Markus Daft M.D.   On: 07/30/2015 13:59   Dg Wrist Complete Right  07/30/2015  CLINICAL DATA:  Right wrist pain, fell today onto cement EXAM: RIGHT WRIST - COMPLETE 3+ VIEW COMPARISON:  None. FINDINGS: Four views of the right wrist submitted. No acute fracture or subluxation. There is degenerative narrowing of radiocarpal joint space. Atherosclerotic vascular calcifications are noted. Erosive degenerative changes are noted triquetrum. IMPRESSION: No acute fracture or subluxation. Degenerative changes as described above. Electronically Signed   By: Lahoma Crocker M.D.    On: 07/30/2015 14:08   Ct Head Wo Contrast  07/30/2015  CLINICAL DATA:  Syncope and fall.  Facial laceration. EXAM: CT HEAD WITHOUT CONTRAST CT MAXILLOFACIAL WITHOUT CONTRAST CT CERVICAL SPINE WITHOUT CONTRAST TECHNIQUE: Multidetector CT imaging of the head, cervical spine, and maxillofacial structures were performed using the standard protocol without intravenous contrast. Multiplanar CT image reconstructions of the cervical spine and maxillofacial structures were also generated. COMPARISON:  CT scan of February 19, 2004. FINDINGS: CT HEAD FINDINGS Bony calvarium appears intact. Mild chronic ischemic white matter disease is noted. Old right  basal ganglia infarction is noted and stable. No mass effect or midline shift is noted. Ventricular size is within normal limits. There is no evidence of mass lesion, hemorrhage or acute infarction. CT MAXILLOFACIAL FINDINGS Globes and orbits appear normal. Paranasal sinuses appear normal. No fracture or other bony abnormality is noted. Pterygoid plates appear normal. CT CERVICAL SPINE FINDINGS No fracture is noted. Minimal grade 1 anterolisthesis of C4-5 is noted secondary to posterior facet joint hypertrophy. Disc spaces are well-maintained. Visualized lung apices appear intact. IMPRESSION: Mild chronic ischemic white matter disease. Old right basal ganglia infarction. No acute intracranial abnormality seen. No abnormality seen in the maxillofacial region. Minimal grade 1 anterolisthesis of C4-5 is noted secondary to posterior facet joint hypertrophy. No acute abnormality seen in the cervical spine. Electronically Signed   By: Marijo Conception, M.D.   On: 07/30/2015 13:50   Ct Cervical Spine Wo Contrast  07/30/2015  CLINICAL DATA:  Syncope and fall.  Facial laceration. EXAM: CT HEAD WITHOUT CONTRAST CT MAXILLOFACIAL WITHOUT CONTRAST CT CERVICAL SPINE WITHOUT CONTRAST TECHNIQUE: Multidetector CT imaging of the head, cervical spine, and maxillofacial structures were  performed using the standard protocol without intravenous contrast. Multiplanar CT image reconstructions of the cervical spine and maxillofacial structures were also generated. COMPARISON:  CT scan of February 19, 2004. FINDINGS: CT HEAD FINDINGS Bony calvarium appears intact. Mild chronic ischemic white matter disease is noted. Old right basal ganglia infarction is noted and stable. No mass effect or midline shift is noted. Ventricular size is within normal limits. There is no evidence of mass lesion, hemorrhage or acute infarction. CT MAXILLOFACIAL FINDINGS Globes and orbits appear normal. Paranasal sinuses appear normal. No fracture or other bony abnormality is noted. Pterygoid plates appear normal. CT CERVICAL SPINE FINDINGS No fracture is noted. Minimal grade 1 anterolisthesis of C4-5 is noted secondary to posterior facet joint hypertrophy. Disc spaces are well-maintained. Visualized lung apices appear intact. IMPRESSION: Mild chronic ischemic white matter disease. Old right basal ganglia infarction. No acute intracranial abnormality seen. No abnormality seen in the maxillofacial region. Minimal grade 1 anterolisthesis of C4-5 is noted secondary to posterior facet joint hypertrophy. No acute abnormality seen in the cervical spine. Electronically Signed   By: Marijo Conception, M.D.   On: 07/30/2015 13:50   Ct Abdomen Pelvis W Contrast  07/30/2015  CLINICAL DATA:  Left lower quadrant pain for 1 day EXAM: CT ABDOMEN AND PELVIS WITH CONTRAST TECHNIQUE: Multidetector CT imaging of the abdomen and pelvis was performed using the standard protocol following bolus administration of intravenous contrast. CONTRAST:  41mL ISOVUE-300 IOPAMIDOL (ISOVUE-300) INJECTION 61% COMPARISON:  01/20/2013 FINDINGS: Lung bases are free of acute infiltrate or sizable effusion. The liver, gallbladder, adrenal glands and pancreas are within normal limits. The spleen demonstrates significant scarring consistent with the history of prior  splenic infarction. The right kidney is well visualized and again demonstrates a renal cyst. No obstructive changes are seen. The left kidney demonstrates mottled enhancement consistent with renal infarction. On the coronal reconstructions on image number 91 of series 5 there is a filling defect identified in the distal left renal artery. Aortoiliac calcifications are noted without aneurysmal dilatation. The appendix has been surgically removed. Scattered diverticular change is noted without evidence of diverticulitis. The bladder is well distended. A fat containing left inguinal hernia is noted. No pelvic mass lesion is seen. The osseous structures show degenerative changes of the lumbar spine. IMPRESSION: Changes consistent with left renal infarct secondary to a distal renal artery  filling defect. Scarring in the spleen consistent with prior splenic infarct. Chronic changes without acute abnormality. Critical Value/emergent results were called by telephone at the time of interpretation on 07/30/2015 at 3:39 pm to [facility] , who verbally acknowledged these results. Electronically Signed   By: Inez Catalina M.D.   On: 07/30/2015 15:42   Ct Maxillofacial Wo Cm  07/30/2015  CLINICAL DATA:  Syncope and fall.  Facial laceration. EXAM: CT HEAD WITHOUT CONTRAST CT MAXILLOFACIAL WITHOUT CONTRAST CT CERVICAL SPINE WITHOUT CONTRAST TECHNIQUE: Multidetector CT imaging of the head, cervical spine, and maxillofacial structures were performed using the standard protocol without intravenous contrast. Multiplanar CT image reconstructions of the cervical spine and maxillofacial structures were also generated. COMPARISON:  CT scan of February 19, 2004. FINDINGS: CT HEAD FINDINGS Bony calvarium appears intact. Mild chronic ischemic white matter disease is noted. Old right basal ganglia infarction is noted and stable. No mass effect or midline shift is noted. Ventricular size is within normal limits. There is no evidence of mass  lesion, hemorrhage or acute infarction. CT MAXILLOFACIAL FINDINGS Globes and orbits appear normal. Paranasal sinuses appear normal. No fracture or other bony abnormality is noted. Pterygoid plates appear normal. CT CERVICAL SPINE FINDINGS No fracture is noted. Minimal grade 1 anterolisthesis of C4-5 is noted secondary to posterior facet joint hypertrophy. Disc spaces are well-maintained. Visualized lung apices appear intact. IMPRESSION: Mild chronic ischemic white matter disease. Old right basal ganglia infarction. No acute intracranial abnormality seen. No abnormality seen in the maxillofacial region. Minimal grade 1 anterolisthesis of C4-5 is noted secondary to posterior facet joint hypertrophy. No acute abnormality seen in the cervical spine. Electronically Signed   By: Marijo Conception, M.D.   On: 07/30/2015 13:50    EKG: Independently reviewed.   Assessment/Plan Active Problems:   Syncope   PMH of CAD h/o CABG, CHF (EF 20%-2008. delined ICD), Sinus Node dysfunction,  PAF (could not tolerate coumadin) complicate with splenic infarction, started on xarelto due to large left appendage thrombus (2014) presented today with syncope. [place] found to left renal infarction   Syncope of unclear etiology. ? Pain induced vagal (renal infarction) vs arrhythmia. Neuro exam is non focal. CT head: old infarct.  -we will monitor on tele due to h/o sinus node dysfunction/chf/afib, check echo, trop. Obtain PT eval, check orthostatics   Left renal infarct. Likely embolic due to chf/paf. Probable triggered due to not taking xarelto last week for 3 days prior to spinal procedure.  -h/o LA appendage thrombus, splenic infarction in the past. Patient is already on xarelto+ ASA. D/w patient, family recommended to avoid interruptions in anticoagulation. Will check echo.   Sinus Node dysfunction,  PAF (could not tolerate coumadin) complicate with splenic infarction.  -Pt declined procedures or devices. 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 Will recheck renal function AM. Monitor urine output. Hold lisinopril today  CHF (EF 20%-2008. delined ICD). but recent echo. LVEF 55% (2014). No s/s of acute HF on exam. We will cont home lasix, BB. Hold lisinopril today     None;  if consultant consulted, please document name and whether formally or informally consulted  Code Status: full (must indicate code status--if unknown or must be presumed, indicate so) Family Communication: d/w patient, his wife, daughter  (indicate person spoken with, if applicable, with phone number if by telephone) Disposition Plan: pend PT (indicate anticipated LOS)  Time spent: >45 minutes   Kinnie Feil [facility] Pager [phone]  If 7PM-7AM, please contact night-coverage [email or web address] Password [facility] 07/30/2015, 5:54 PM

## 2015-07-30 NOTE — ED Notes (Signed)
Patient transported to CT 

## 2015-07-30 NOTE — ED Notes (Signed)
PA sam at bedside

## 2015-07-30 NOTE — ED Notes (Signed)
MD at bedside. 

## 2015-07-30 NOTE — ED Notes (Signed)
Family at bedside. 

## 2015-07-30 NOTE — ED Notes (Addendum)
Pt arrives via [facility], ems reports pt was walking out of his house, had a syncopal episode and fell, striking his face on the concrete, pt is on xarelto. c collar in place, laceration to nose and eyebrow and abrasion to left elbow. EMS pt in a fib on monitor with hx of same, Pt a/ox4.

## 2015-07-31 ENCOUNTER — Observation Stay (HOSPITAL_BASED_OUTPATIENT_CLINIC_OR_DEPARTMENT_OTHER): Payer: Medicare Other

## 2015-07-31 ENCOUNTER — Encounter (HOSPITAL_COMMUNITY): Payer: Self-pay | Admitting: General Practice

## 2015-07-31 DIAGNOSIS — N280 Ischemia and infarction of kidney: Secondary | ICD-10-CM | POA: Diagnosis not present

## 2015-07-31 DIAGNOSIS — R55 Syncope and collapse: Secondary | ICD-10-CM | POA: Diagnosis not present

## 2015-07-31 DIAGNOSIS — I6523 Occlusion and stenosis of bilateral carotid arteries: Secondary | ICD-10-CM | POA: Diagnosis not present

## 2015-07-31 DIAGNOSIS — Z7901 Long term (current) use of anticoagulants: Secondary | ICD-10-CM

## 2015-07-31 DIAGNOSIS — I482 Chronic atrial fibrillation: Secondary | ICD-10-CM

## 2015-07-31 DIAGNOSIS — I6529 Occlusion and stenosis of unspecified carotid artery: Secondary | ICD-10-CM | POA: Diagnosis not present

## 2015-07-31 LAB — BASIC METABOLIC PANEL
Anion gap: 12 (ref 5–15)
BUN: 23 mg/dL — AB (ref 6–20)
CHLORIDE: 99 mmol/L — AB (ref 101–111)
CO2: 26 mmol/L (ref 22–32)
CREATININE: 1.85 mg/dL — AB (ref 0.61–1.24)
Calcium: 8.7 mg/dL — ABNORMAL LOW (ref 8.9–10.3)
GFR calc non Af Amer: 33 mL/min — ABNORMAL LOW (ref >60)
GFR, EST AFRICAN AMERICAN: 38 mL/min — AB (ref >60)
Glucose, Bld: 118 mg/dL — ABNORMAL HIGH (ref 65–99)
POTASSIUM: 4.1 mmol/L (ref 3.5–5.1)
Sodium: 137 mmol/L (ref 135–145)

## 2015-07-31 LAB — ECHOCARDIOGRAM COMPLETE
Height: 66 in
WEIGHTICAEL: 3083.2 [oz_av]

## 2015-07-31 LAB — TROPONIN I

## 2015-07-31 MED ORDER — MORPHINE SULFATE (PF) 2 MG/ML IV SOLN
2.0000 mg | INTRAVENOUS | Status: DC | PRN
  Administered 2015-07-31: 2 mg via INTRAVENOUS
  Filled 2015-07-31: qty 1

## 2015-07-31 MED ORDER — MORPHINE SULFATE (PF) 4 MG/ML IV SOLN
4.0000 mg | INTRAVENOUS | Status: DC | PRN
  Administered 2015-07-31 (×2): 4 mg via INTRAVENOUS
  Filled 2015-07-31 (×3): qty 1

## 2015-07-31 MED ORDER — ENSURE ENLIVE PO LIQD
237.0000 mL | Freq: Two times a day (BID) | ORAL | Status: DC
  Administered 2015-07-31 – 2015-08-02 (×4): 237 mL via ORAL

## 2015-07-31 MED ORDER — ONDANSETRON HCL 4 MG PO TABS: 4.0000 mg | ORAL_TABLET | Freq: Four times a day (QID) | ORAL | Status: DC | PRN

## 2015-07-31 MED ORDER — HYDROCODONE-ACETAMINOPHEN 5-325 MG PO TABS
1.0000 | ORAL_TABLET | ORAL | Status: DC | PRN
Start: 2015-07-31 — End: 2015-08-02
  Filled 2015-07-31 (×2): qty 1

## 2015-07-31 MED ORDER — ONDANSETRON HCL 4 MG/2ML IJ SOLN
4.0000 mg | Freq: Four times a day (QID) | INTRAMUSCULAR | Status: DC | PRN
  Administered 2015-07-31 (×2): 4 mg via INTRAVENOUS
  Filled 2015-07-31 (×2): qty 2

## 2015-07-31 MED ORDER — PERFLUTREN LIPID MICROSPHERE
1.0000 mL | INTRAVENOUS | Status: AC | PRN
  Administered 2015-07-31: 2 mL via INTRAVENOUS
  Filled 2015-07-31: qty 10

## 2015-07-31 NOTE — Evaluation (Signed)
Physical Therapy Evaluation Patient Details Name: Gregory [name] MRN: [id number] DOB: 1932-11-20 Today's Date: 07/31/2015   History of Present Illness  82 y.o. male with PMH of CAD h/o CABG, CHF (EF 20%-2008. delined ICD), Sinus Node dysfunction, PAF (could not tolerate coumadin) complicate with splenic infarction, started on xarelto due to large left appendage thrombus (2014) presented with syncope. Dx of renal infarct.   Clinical Impression  Pt admitted with above diagnosis. Pt currently with functional limitations due to the deficits listed below (see PT Problem List). Pt ambulated 26' without an assistive device, no loss of balance, distance limited by pain/fatigue/nausea. Orthostatic BP: supine 153/69, sitting 129/66, standing 144/67, pt denied dizziness with position changes. Anticipate pt will return to independence with mobility once pain and nausea resolve.  Pt will benefit from skilled PT to increase their independence and safety with mobility to allow discharge to the venue listed below.       Follow Up Recommendations No PT follow up    Equipment Recommendations  None recommended by PT    Recommendations for Other Services       Precautions / Restrictions Precautions Precautions: Fall Precaution Comments: no prior falls, admitted with syncope Restrictions Weight Bearing Restrictions: No      Mobility  Bed Mobility Overal bed mobility: Modified Independent             General bed mobility comments: HOB up 30*, used rail, increased time  Transfers Overall transfer level: Needs assistance Equipment used: None Transfers: Sit to/from Stand Sit to Stand: Min guard         General transfer comment: min/guard for safety due to recent syncope  Ambulation/Gait Ambulation/Gait assistance: Min guard Ambulation Distance (Feet): 26 Feet Assistive device: None Gait Pattern/deviations: WFL(Within Functional Limits)   Gait velocity interpretation: Below normal  speed for age/gender General Gait Details: min/guard for safety, no loss of balance, distance limited by pain/nausea/fatigue  Stairs            Wheelchair Mobility    Modified Rankin (Stroke Patients Only)       Balance Overall balance assessment: Independent                                           Pertinent Vitals/Pain Pain Assessment: 0-10 Pain Score: 8  Pain Location: L flank Pain Descriptors / Indicators: Sore Pain Intervention(s): Monitored during session;Limited activity within patient's tolerance;Patient requesting pain meds-RN notified    Home Living Family/patient expects to be discharged to:: Private residence Living Arrangements: Spouse/significant other Available Help at Discharge: Family;Available 24 hours/day Type of Home: House Home Access: Stairs to enter Entrance Stairs-Rails: Can reach both;Left;Right Entrance Stairs-Number of Steps: 3 Home Layout: One level Home Equipment: Bedside commode;Crutches;Walker - 2 wheels      Prior Function Level of Independence: Independent         Comments: drives, works in [employer] operational [employer]        Extremity/Trunk Assessment   Upper Extremity Assessment: Overall WFL for tasks assessed           Lower Extremity Assessment: Overall WFL for tasks assessed      Cervical / Trunk Assessment: Normal  Communication   Communication: No difficulties  Cognition Arousal/Alertness: Lethargic;Suspect due to medications Behavior During Therapy: [facility] for tasks assessed/performed Overall Cognitive Status: Within Functional Limits for tasks assessed  General Comments      Exercises        Assessment/Plan    PT Assessment Patient needs continued PT services  PT Diagnosis Acute pain   PT Problem List Decreased activity tolerance;Pain;Decreased mobility  PT Treatment Interventions Gait training;Functional mobility training;Therapeutic exercise    PT Goals (Current goals can be found in the Care Plan section) Acute Rehab PT Goals Patient Stated Goal: return to working in garden and with his chickens PT Goal Formulation: With patient/family Time For Goal Achievement: 08/14/15 Potential to Achieve Goals: Good    Frequency Min 3X/week   Barriers to discharge        Co-evaluation               End of Session   Activity Tolerance: Patient limited by fatigue;Patient limited by pain Patient left: in bed;with call bell/phone within reach;with family/visitor present Nurse Communication: Mobility status    Functional Assessment Tool Used: clinical judgement Functional Limitation: Mobility: Walking and moving around Mobility: Walking and Moving Around Current Status [id number]): At least 20 percent but less than 40 percent impaired, limited or restricted Mobility: Walking and Moving Around Goal Status [id number]): 0 percent impaired, limited or restricted    Time: 1342-1407 PT Time Calculation (min) (ACUTE ONLY): 25 min   Charges:   PT Evaluation $PT Eval Low Complexity: 1 Procedure PT Treatments $Gait Training: 8-22 mins   PT G Codes:   PT G-Codes **NOT FOR INPATIENT CLASS** Functional Assessment Tool Used: clinical judgement Functional Limitation: Mobility: Walking and moving around Mobility: Walking and Moving Around Current Status [id number]): At least 20 percent but less than 40 percent impaired, limited or restricted Mobility: Walking and Moving Around Goal Status [phone]): 0 percent impaired, limited or restricted    Philomena Doheny 07/31/2015, 2:18 PM [phone]

## 2015-07-31 NOTE — Care Management Obs Status (Signed)
[place] NOTIFICATION   Patient Details  Name: Gregory [name] MRN: [id number] Date of Birth: December 28, 1932   Medicare Observation Status Notification Given:  Yes (syncope)    Bethena Roys, RN 07/31/2015, 3:25 PM

## 2015-07-31 NOTE — Progress Notes (Signed)
Initial Nutrition Assessment  DOCUMENTATION CODES:   Obesity unspecified  INTERVENTION:  Ensure Enlive BID. Each supplement provides 350 kcals and 20 grams of protein. Snacks BID.   NUTRITION DIAGNOSIS:   Inadequate oral intake related to nausea as evidenced by per patient/family report, meal completion < 25%.  GOAL:   Patient will meet greater than or equal to 90% of their needs  MONITOR:   PO intake, Supplement acceptance, Labs, Weight trends, Skin, I & O's  REASON FOR ASSESSMENT:   Consult Assessment of nutrition requirement/status  ASSESSMENT:   Pt with PMH of CAD h/o CABG, CHF (EF 20%-2008. delined ICD), Sinus Node dysfunction, PAF (could not tolerate coumadin) complicate with splenic infarction, started on xarelto due to large left appendage thrombus (2014) presented today with syncope. He states feeling nauseated this morning, developed left sided back and LLQ abdominal pains. He walked out to the garage and had a syncopal episode for few seconds, fell face down. His family was present did not reports any seizure. He denies any focal neuro weakness or paresthesia, no chest pains, no SOB, no dizziness. But reports mild nausea, and left sided abdominal pain prior to the event. He reports not taking xarelto for 3 days last week due to scheduled spinal steroid injection .   Pt weight is stable. Pt and family report no changes in weight over the past year.  Pt had good appetite PTA.  Now, pt is experiencing nausea with eating and poor appetite.  Discussed options that would make pt less nauseous.  Pt reports willingness to try jello, coca cola, Ensure, and cheese.  Will order snacks and Ensure BID.  NFPE: no muscle depletion, no fat depletion, no edema. Labs reviewed; Cl 99, BUN 23, creat 1.85, Ca 8.7, GFR 33, glucose 118. Meds reviewed; Lasix 20 mg.  Diet Order:  Diet 2 gram sodium Room service appropriate?: Yes; Fluid consistency:: Thin  Skin:  Reviewed, no  issues  Last BM:  unknown  Height:   Ht Readings from Last 1 Encounters:  07/30/15 5\' 6"  (1.676 m)    Weight:   Wt Readings from Last 1 Encounters:  07/31/15 192 lb 11.2 oz (87.408 kg)    Ideal Body Weight:  64.5 kg  BMI:  Body mass index is 31.12 kg/(m^2).  Estimated Nutritional Needs:   Kcal:  1550-1750   Protein:  90-100   Fluid:  1.5-1.7 L  EDUCATION NEEDS:   No education needs identified at this time  Geoffery Lyons, [place] Dietetic Intern Pager [phone]

## 2015-07-31 NOTE — Care Management Note (Signed)
Case Management Note  Patient Details  Name: DOUGLES TILLERSON MRN: [id number] Date of Birth: May 15, 1932  Subjective/Objective:  Pt in for syncope and fall. Pt is from home with wife. Plan will be to return home once stable.                  Action/Plan: CM did discuss home needs. No needs identified at this time. Will continue to monitor.    Expected Discharge Date:                  Expected Discharge Plan:  Home/Self Care  In-House Referral:  NA  Discharge planning Services  CM Consult  Post Acute Care Choice:  NA Choice offered to:  NA  DME Arranged:  N/A DME Agency:  NA  HH Arranged:  NA HH Agency:  NA  Status of Service:  Completed, signed off  Medicare Important Message Given:    Date Medicare IM Given:    Medicare IM give by:    Date Additional Medicare IM Given:    Additional Medicare Important Message give by:     If discussed at [place] of Stay Meetings, dates discussed:    Additional Comments:  Bethena Roys, RN 07/31/2015, 4:29 PM

## 2015-07-31 NOTE — Progress Notes (Signed)
Echocardiogram 2D Echocardiogram has been performed.  Aggie Cosier 07/31/2015, 12:22 PM

## 2015-07-31 NOTE — Progress Notes (Addendum)
Progress Note    SHURMAN PRITCHARD  [id number] DOB: 02/11/1933  DOA: 07/30/2015 PCP: Lamar Blinks, MD   Cardiologist: Dr. Claiborne Billings  Brief Narrative:   Gregory [name] is an 82 y.o. 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 He had left lower quadrant abdominal pain prior to the syncopal event. Workup in the ED revealed evidence of a left renal infarction.  Assessment/Plan:   Principal Problem:   Syncope with fall resulting in facial trauma Likely a vasovagal event induced by acute severe abdominal pain. Continue to monitor on telemetry. 2-D echo showed an EF of 55-60 percent and was consistent with his prior echo with no changes. CT of the head, maxillofacial area and cervical spine were negative for fractures. PT evaluation performed with no PT follow-up recommended. Will ask cardiology to evaluate Per family request. No right heart strain to suggest acute pulmonary embolism, but this is a consideration given the interruption in his anticoagulation (though felt to be less likely--no tachycardia, hypoxia or tachypnea).  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    Chronic a-fib (HCC)/ Anticoagulated on Xarelto Rate well controlled. Continue Xarelto.    Renal infarction ([facility]) Appears to be related to interrupting anticoagulation x 3 days for a procedure. Continue pain control efforts.   Family Communication/Anticipated D/C date and plan/Code Status   DVT prophylaxis: On Xarelto chronically. Code Status: Full Code.  Family Communication: Wife and daughter updated at the bedside. Disposition Plan: Possibly home 08/01/15 if pain controlled.   Medical  Consultants:    Cardiology  Procedures:   2-D echo 123XX123: Systolic function was normal. The estimated ejection fraction was in the range of 55% to 60%. Wall motion was normal; there were no regional wall motion abnormalities. The study is not technically sufficient to allow evaluation of LV diastolic function.  Anti-Infectives:   Anti-infectives    None      Subjective:   [name] had received morphine prior to my assessment of him and he is currently sleeping. His family reports that he was having lower left abdominal pain.  Objective:    Filed Vitals:   07/30/15 1831 07/30/15 2100 07/31/15 0500 07/31/15 0850  BP: 160/75 133/48 157/64 151/72  Pulse: 64 64 64 76  Temp: 98.4 F (36.9 C) 98.6 F (37 C) 98.1 F (36.7 C) 99.4 F (37.4 C)  TempSrc: Oral Oral Oral Oral  Resp: 20 18 18 18   Height: 5\' 6"  (1.676 m)     Weight: 88.27 kg (194 lb 9.6 oz)  87.408 kg (192 lb 11.2 oz)   SpO2: 99% 97% 96% 95%    Intake/Output Summary (Last 24 hours) at 07/31/15 1736 Last data filed at 07/31/15 0800  Gross per 24 hour  Intake      0 ml  Output    200 ml  Net   -200 ml   Filed Weights   07/30/15 1831 07/31/15 0500  Weight: 88.27 kg (194 lb 9.6 oz) 87.408 kg (192 lb 11.2 oz)    Exam: General exam: Resting comfortably. Respiratory system: Clear to auscultation. Respiratory effort normal. Cardiovascular system: S1 & S2 heard, irregularly irregular heart rhythm. No JVD,  rubs, gallops or clicks.  No murmurs. Gastrointestinal system: Abdomen is nondistended, soft. No organomegaly or masses felt. Normal bowel sounds heard. Central nervous system: The patient is asleep. No focal neurological deficits. Extremities: No clubbing, edema, or cyanosis. Skin: No rashes, lesions or ulcers.   Data Reviewed:   I have personally reviewed following labs and imaging studies:  Labs: Basic Metabolic Panel:  Recent Labs Lab 07/30/15 1400 07/31/15 0838  NA 136 137  K 4.0 4.1    CL 99* 99*  CO2 27 26  GLUCOSE 107* 118*  BUN 23* 23*  CREATININE 1.83* 1.85*  CALCIUM 8.9 8.7*   GFR Estimated Creatinine Clearance: 32.4 mL/min (by C-G formula based on Cr of 1.85). Liver Function Tests: No results for input(s): AST, ALT, ALKPHOS, BILITOT, PROT, ALBUMIN in the last 168 hours. No results for input(s): LIPASE, AMYLASE in the last 168 hours. No results for input(s): AMMONIA in the last 168 hours. Coagulation profile No results for input(s): INR, PROTIME in the last 168 hours.  CBC:  Recent Labs Lab 07/30/15 1400  WBC 12.5*  NEUTROABS 8.8*  HGB 13.0  HCT 39.5  MCV 86.1  PLT 214   Cardiac Enzymes:  Recent Labs Lab 07/30/15 1836 07/31/15 0141 07/31/15 0838  TROPONINI <0.03 <0.03 <0.03   Urine analysis:    Component Value Date/Time   COLORURINE YELLOW 07/30/2015 1619   APPEARANCEUR CLEAR 07/30/2015 1619   LABSPEC 1.017 07/30/2015 1619   PHURINE 5.5 07/30/2015 1619   GLUCOSEU NEGATIVE 07/30/2015 1619   HGBUR MODERATE* 07/30/2015 1619   BILIRUBINUR NEGATIVE 07/30/2015 1619   BILIRUBINUR negative 01/20/2013 1623   KETONESUR NEGATIVE 07/30/2015 1619   PROTEINUR 30* 07/30/2015 1619   PROTEINUR 30 01/20/2013 1623   UROBILINOGEN 0.2 01/20/2013 1623   UROBILINOGEN 1.0 03/30/2011 2224   NITRITE NEGATIVE 07/30/2015 1619   NITRITE negative 01/20/2013 1623   LEUKOCYTESUR NEGATIVE 07/30/2015 1619   Microbiology No results found for this or any previous visit (from the past 240 hour(s)).  Radiology: Dg Chest 2 View  07/30/2015  CLINICAL DATA:  Syncope with fall EXAM: CHEST  2 VIEW COMPARISON:  February 09, 2014 FINDINGS: Lungs are clear. Heart is enlarged with pulmonary vascularity within normal limits. Patient is status post coronary artery bypass grafting. No adenopathy. No bone lesions. There is degenerative change in the lower thoracic and upper lumbar spine regions. IMPRESSION: Cardiomegaly.  No edema or consolidation. Electronically Signed   By:  Lowella Grip III M.D.   On: 07/30/2015 13:53   Dg Elbow Complete Left  07/30/2015  CLINICAL DATA:  [place] today.  Syncope.  Skin tear on the elbow. EXAM: LEFT ELBOW - COMPLETE 3+ VIEW COMPARISON:  None. FINDINGS: Negative for a fracture or dislocation. Mild spurring and degenerative changes in the proximal ulna. Mild spurring or enthesopathic changes involving the lateral humeral epicondyle. Irregularity of the soft tissue along the lateral aspect of the elbow. IMPRESSION: No acute bone abnormality to the left elbow. Electronically Signed   By: Markus Daft M.D.   On: 07/30/2015 13:59   Dg Wrist Complete Right  07/30/2015  CLINICAL DATA:  Right wrist pain, fell today onto cement EXAM: RIGHT WRIST - COMPLETE 3+ VIEW COMPARISON:  None. FINDINGS: Four views of the right wrist submitted. No acute fracture or subluxation. There is degenerative narrowing of radiocarpal joint space. Atherosclerotic vascular calcifications are noted. Erosive degenerative changes are noted triquetrum. IMPRESSION: No acute fracture or subluxation. Degenerative changes as described above. Electronically Signed   By: Orlean Bradford.D.  On: 07/30/2015 14:08   Ct Head Wo Contrast  07/30/2015  CLINICAL DATA:  Syncope and fall.  Facial laceration. EXAM: CT HEAD WITHOUT CONTRAST CT MAXILLOFACIAL WITHOUT CONTRAST CT CERVICAL SPINE WITHOUT CONTRAST TECHNIQUE: Multidetector CT imaging of the head, cervical spine, and maxillofacial structures were performed using the standard protocol without intravenous contrast. Multiplanar CT image reconstructions of the cervical spine and maxillofacial structures were also generated. COMPARISON:  CT scan of February 19, 2004. FINDINGS: CT HEAD FINDINGS Bony calvarium appears intact. Mild chronic ischemic white matter disease is noted. Old right basal ganglia infarction is noted and stable. No mass effect or midline shift is noted. Ventricular size is within normal limits. There is no evidence of mass lesion,  hemorrhage or acute infarction. CT MAXILLOFACIAL FINDINGS Globes and orbits appear normal. Paranasal sinuses appear normal. No fracture or other bony abnormality is noted. Pterygoid plates appear normal. CT CERVICAL SPINE FINDINGS No fracture is noted. Minimal grade 1 anterolisthesis of C4-5 is noted secondary to posterior facet joint hypertrophy. Disc spaces are well-maintained. Visualized lung apices appear intact. IMPRESSION: Mild chronic ischemic white matter disease. Old right basal ganglia infarction. No acute intracranial abnormality seen. No abnormality seen in the maxillofacial region. Minimal grade 1 anterolisthesis of C4-5 is noted secondary to posterior facet joint hypertrophy. No acute abnormality seen in the cervical spine. Electronically Signed   By: Marijo Conception, M.D.   On: 07/30/2015 13:50   Ct Cervical Spine Wo Contrast  07/30/2015  CLINICAL DATA:  Syncope and fall.  Facial laceration. EXAM: CT HEAD WITHOUT CONTRAST CT MAXILLOFACIAL WITHOUT CONTRAST CT CERVICAL SPINE WITHOUT CONTRAST TECHNIQUE: Multidetector CT imaging of the head, cervical spine, and maxillofacial structures were performed using the standard protocol without intravenous contrast. Multiplanar CT image reconstructions of the cervical spine and maxillofacial structures were also generated. COMPARISON:  CT scan of February 19, 2004. FINDINGS: CT HEAD FINDINGS Bony calvarium appears intact. Mild chronic ischemic white matter disease is noted. Old right basal ganglia infarction is noted and stable. No mass effect or midline shift is noted. Ventricular size is within normal limits. There is no evidence of mass lesion, hemorrhage or acute infarction. CT MAXILLOFACIAL FINDINGS Globes and orbits appear normal. Paranasal sinuses appear normal. No fracture or other bony abnormality is noted. Pterygoid plates appear normal. CT CERVICAL SPINE FINDINGS No fracture is noted. Minimal grade 1 anterolisthesis of C4-5 is noted secondary to  posterior facet joint hypertrophy. Disc spaces are well-maintained. Visualized lung apices appear intact. IMPRESSION: Mild chronic ischemic white matter disease. Old right basal ganglia infarction. No acute intracranial abnormality seen. No abnormality seen in the maxillofacial region. Minimal grade 1 anterolisthesis of C4-5 is noted secondary to posterior facet joint hypertrophy. No acute abnormality seen in the cervical spine. Electronically Signed   By: Marijo Conception, M.D.   On: 07/30/2015 13:50   Ct Abdomen Pelvis W Contrast  07/30/2015  CLINICAL DATA:  Left lower quadrant pain for 1 day EXAM: CT ABDOMEN AND PELVIS WITH CONTRAST TECHNIQUE: Multidetector CT imaging of the abdomen and pelvis was performed using the standard protocol following bolus administration of intravenous contrast. CONTRAST:  45mL ISOVUE-300 IOPAMIDOL (ISOVUE-300) INJECTION 61% COMPARISON:  01/20/2013 FINDINGS: Lung bases are free of acute infiltrate or sizable effusion. The liver, gallbladder, adrenal glands and pancreas are within normal limits. The spleen demonstrates significant scarring consistent with the history of prior splenic infarction. The right kidney is well visualized and again demonstrates a renal cyst. No obstructive changes are seen. The  left kidney demonstrates mottled enhancement consistent with renal infarction. On the coronal reconstructions on image number 91 of series 5 there is a filling defect identified in the distal left renal artery. Aortoiliac calcifications are noted without aneurysmal dilatation. The appendix has been surgically removed. Scattered diverticular change is noted without evidence of diverticulitis. The bladder is well distended. A fat containing left inguinal hernia is noted. No pelvic mass lesion is seen. The osseous structures show degenerative changes of the lumbar spine. 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 Scarring in  the spleen consistent with prior splenic infarct. Chronic changes without acute abnormality. Critical Value/emergent results were called by telephone at the time of interpretation on 07/30/2015 at 3:39 pm to [employer], [facility] , who verbally acknowledged these results. Electronically Signed   By: Inez Catalina M.D.   On: 07/30/2015 15:42   Ct Maxillofacial Wo Cm  07/30/2015  CLINICAL DATA:  Syncope and fall.  Facial laceration. EXAM: CT HEAD WITHOUT CONTRAST CT MAXILLOFACIAL WITHOUT CONTRAST CT CERVICAL SPINE WITHOUT CONTRAST TECHNIQUE: Multidetector CT imaging of the head, cervical spine, and maxillofacial structures were performed using the standard protocol without intravenous contrast. Multiplanar CT image reconstructions of the cervical spine and maxillofacial structures were also generated. COMPARISON:  CT scan of February 19, 2004. FINDINGS: CT HEAD FINDINGS Bony calvarium appears intact. Mild chronic ischemic white matter disease is noted. Old right basal ganglia infarction is noted and stable. No mass effect or midline shift is noted. Ventricular size is within normal limits. There is no evidence of mass lesion, hemorrhage or acute infarction. CT MAXILLOFACIAL FINDINGS Globes and orbits appear normal. Paranasal sinuses appear normal. No fracture or other bony abnormality is noted. Pterygoid plates appear normal. CT CERVICAL SPINE FINDINGS No fracture is noted. Minimal grade 1 anterolisthesis of C4-5 is noted secondary to posterior facet joint hypertrophy. Disc spaces are well-maintained. Visualized lung apices appear intact. IMPRESSION: Mild chronic ischemic white matter disease. Old right basal ganglia infarction. No acute intracranial abnormality seen. No abnormality seen in the maxillofacial region. Minimal grade 1 anterolisthesis of C4-5 is noted secondary to posterior facet joint hypertrophy. No acute abnormality seen in the cervical spine. Electronically Signed   By: Marijo Conception, M.D.   On: 07/30/2015  13:50    Medications:   . aspirin EC  81 mg Oral Daily  . carvedilol  3.125 mg Oral BID WC  . feeding supplement (ENSURE ENLIVE)  237 mL Oral BID BM  . furosemide  20 mg Oral Daily  . pantoprazole  40 mg Oral Daily  . rivaroxaban  20 mg Oral Q supper  . rosuvastatin  40 mg Oral Daily  . sodium chloride flush  3 mL Intravenous Q12H   Continuous Infusions:   Time spent: 25 minutes.     [facility] Pager [phone]. If unable to reach me by pager, please call my cell phone at [phone].  *Please refer to [email or web address], password [email or web address] to get updated schedule on who will round on this patient, as hospitalists switch teams weekly. If 7PM-7AM, please contact night-coverage at [email or web address], password [email or web address] for any overnight needs.  07/31/2015, 5:36 PM

## 2015-08-01 DIAGNOSIS — N280 Ischemia and infarction of kidney: Secondary | ICD-10-CM | POA: Diagnosis not present

## 2015-08-01 DIAGNOSIS — I2510 Atherosclerotic heart disease of native coronary artery without angina pectoris: Secondary | ICD-10-CM | POA: Insufficient documentation

## 2015-08-01 DIAGNOSIS — R55 Syncope and collapse: Secondary | ICD-10-CM | POA: Diagnosis not present

## 2015-08-01 DIAGNOSIS — I6523 Occlusion and stenosis of bilateral carotid arteries: Secondary | ICD-10-CM

## 2015-08-01 DIAGNOSIS — I6529 Occlusion and stenosis of unspecified carotid artery: Secondary | ICD-10-CM | POA: Diagnosis not present

## 2015-08-01 DIAGNOSIS — I25118 Atherosclerotic heart disease of native coronary artery with other forms of angina pectoris: Secondary | ICD-10-CM

## 2015-08-01 DIAGNOSIS — I482 Chronic atrial fibrillation: Secondary | ICD-10-CM | POA: Diagnosis not present

## 2015-08-01 DIAGNOSIS — Z7901 Long term (current) use of anticoagulants: Secondary | ICD-10-CM | POA: Diagnosis not present

## 2015-08-01 MED ORDER — RIVAROXABAN 15 MG PO TABS
15.0000 mg | ORAL_TABLET | Freq: Every day | ORAL | Status: DC
  Administered 2015-08-01: 15 mg via ORAL
  Filled 2015-08-01: qty 1

## 2015-08-01 NOTE — Progress Notes (Signed)
Progress Note    Gregory [name]  [id number] DOB: Jul 15, 1932  DOA: 07/30/2015 PCP: Lamar Blinks, MD   Cardiologist: Dr. Claiborne Billings  Brief Narrative:   BORDEN RANDLETT is an 82 y.o. 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 He had left lower quadrant abdominal pain prior to the syncopal event. Workup in the ED revealed evidence of a left renal infarction.  Assessment/Plan:   Principal Problem:   Syncope with fall resulting in facial trauma Likely a vasovagal event induced by acute severe abdominal pain. Continue to monitor on telemetry. 2-D echo showed an EF of 55-60 percent and was consistent with his prior echo with no changes. CT of the head, maxillofacial area and cervical spine were negative for fractures. PT evaluation performed with no PT follow-up recommended. Cardiology consulted per family request. They suggested possible carotid artery disease may be a potential etiology, but notes that he has refused invasive/surgical intervention. No right heart strain to suggest acute pulmonary embolism, but this is a consideration given the interruption in his anticoagulation (though felt to be less likely--no tachycardia, hypoxia or tachypnea).  Active Problems:   Bilateral Carotid Artery Disease: Follow-up repeat Doppler studies. He has been seen in the past by Dr. Oneida Alar. Per records, he has refused to consider any potential carotid intervention or surgery.    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    Chronic a-fib (HCC)/ Anticoagulated on Xarelto Rate well controlled. Continue Xarelto.    Renal infarction ([place]) Appears to be related to interrupting anticoagulation x 3 days  for a procedure. Continue pain control efforts.   Family Communication/Anticipated D/C date and plan/Code Status   DVT prophylaxis: On Xarelto chronically. Code Status: Full Code.  Family Communication: Wife updated at the bedside. Disposition Plan: Possibly home 08/02/15 if pain controlled.   Medical Consultants:    Cardiology  Procedures:   2-D echo [id number]: Systolic function was normal. The estimated ejection fraction was in the range of 55% to 60%. Wall motion was normal; there were no regional wall motion abnormalities. The study is not technically sufficient to allow evaluation of LV diastolic function.  Anti-Infectives:   Anti-infectives    None      Subjective:   Gregory [name] continues to have significant left lower quadrant pain at times. A little more awake/alert today. No presyncope or near syncopal type events. No chest pain or palpitations.  Objective:    Filed Vitals:   07/31/15 0850 07/31/15 1803 07/31/15 2055 08/01/15 0505  BP: 151/72 131/58 124/56 118/43  Pulse: 76  77 74  Temp: 99.4 F (37.4 C)  98.5 F (36.9 C) 99 F (37.2 C)  TempSrc: Oral  Oral Oral  Resp: 18  18 18   Height:      Weight:    87.181 kg (192 lb 3.2 oz)  SpO2: 95%  98% 95%    Intake/Output Summary (Last 24 hours) at 08/01/15 0748 Last data filed at 07/31/15 1844  Gross per 24 hour  Intake    560 ml  Output    400 ml  Net    160 ml   Filed Weights   07/30/15 1831 07/31/15 0500 08/01/15 0505  Weight: 88.27 kg (194 lb 9.6  oz) 87.408 kg (192 lb 11.2 oz) 87.181 kg (192 lb 3.2 oz)    Exam: General exam: Resting comfortably. Respiratory system: Clear to auscultation. Respiratory effort normal. Cardiovascular system: S1 & S2 heard, irregularly irregular heart rhythm. No JVD,  rubs, gallops or clicks. No murmurs. Gastrointestinal system: Abdomen is nondistended, soft. No organomegaly or masses felt. Normal bowel sounds heard. Central nervous system: The patient is asleep. No  focal neurological deficits. Extremities: No clubbing, edema, or cyanosis. Skin: No rashes, lesions or ulcers.   Data Reviewed:   I have personally reviewed following labs and imaging studies:  Labs: Basic Metabolic Panel:  Recent Labs Lab 07/30/15 1400 07/31/15 0838  NA 136 137  K 4.0 4.1  CL 99* 99*  CO2 27 26  GLUCOSE 107* 118*  BUN 23* 23*  CREATININE 1.83* 1.85*  CALCIUM 8.9 8.7*   GFR Estimated Creatinine Clearance: 32.4 mL/min (by C-G formula based on Cr of 1.85). Liver Function Tests: No results for input(s): AST, ALT, ALKPHOS, BILITOT, PROT, ALBUMIN in the last 168 hours. No results for input(s): LIPASE, AMYLASE in the last 168 hours. No results for input(s): AMMONIA in the last 168 hours. Coagulation profile No results for input(s): INR, PROTIME in the last 168 hours.  CBC:  Recent Labs Lab 07/30/15 1400  WBC 12.5*  NEUTROABS 8.8*  HGB 13.0  HCT 39.5  MCV 86.1  PLT 214   Cardiac Enzymes:  Recent Labs Lab 07/30/15 1836 07/31/15 0141 07/31/15 0838  TROPONINI <0.03 <0.03 <0.03   Urine analysis:    Component Value Date/Time   COLORURINE YELLOW 07/30/2015 1619   APPEARANCEUR CLEAR 07/30/2015 1619   LABSPEC 1.017 07/30/2015 1619   PHURINE 5.5 07/30/2015 1619   GLUCOSEU NEGATIVE 07/30/2015 1619   HGBUR MODERATE* 07/30/2015 1619   BILIRUBINUR NEGATIVE 07/30/2015 1619   BILIRUBINUR negative 01/20/2013 1623   KETONESUR NEGATIVE 07/30/2015 1619   PROTEINUR 30* 07/30/2015 1619   PROTEINUR 30 01/20/2013 1623   UROBILINOGEN 0.2 01/20/2013 1623   UROBILINOGEN 1.0 03/30/2011 2224   NITRITE NEGATIVE 07/30/2015 1619   NITRITE negative 01/20/2013 1623   LEUKOCYTESUR NEGATIVE 07/30/2015 1619   Microbiology No results found for this or any previous visit (from the past 240 hour(s)).  Radiology: Dg Chest 2 View  07/30/2015  CLINICAL DATA:  Syncope with fall EXAM: CHEST  2 VIEW COMPARISON:  February 09, 2014 FINDINGS: Lungs are clear. Heart is  enlarged with pulmonary vascularity within normal limits. Patient is status post coronary artery bypass grafting. No adenopathy. No bone lesions. There is degenerative change in the lower thoracic and upper lumbar spine regions. IMPRESSION: Cardiomegaly.  No edema or consolidation. Electronically Signed   By: Lowella Grip III M.D.   On: 07/30/2015 13:53   Dg Elbow Complete Left  07/30/2015  CLINICAL DATA:  [place] today.  Syncope.  Skin tear on the elbow. EXAM: LEFT ELBOW - COMPLETE 3+ VIEW COMPARISON:  None. FINDINGS: Negative for a fracture or dislocation. Mild spurring and degenerative changes in the proximal ulna. Mild spurring or enthesopathic changes involving the lateral humeral epicondyle. Irregularity of the soft tissue along the lateral aspect of the elbow. IMPRESSION: No acute bone abnormality to the left elbow. Electronically Signed   By: Markus Daft M.D.   On: 07/30/2015 13:59   Dg Wrist Complete Right  07/30/2015  CLINICAL DATA:  Right wrist pain, fell today onto cement EXAM: RIGHT WRIST - COMPLETE 3+ VIEW COMPARISON:  None. FINDINGS: Four views of the right wrist submitted.  No acute fracture or subluxation. There is degenerative narrowing of radiocarpal joint space. Atherosclerotic vascular calcifications are noted. Erosive degenerative changes are noted triquetrum. IMPRESSION: No acute fracture or subluxation. Degenerative changes as described above. Electronically Signed   By: Lahoma Crocker M.D.   On: 07/30/2015 14:08   Ct Head Wo Contrast  07/30/2015  CLINICAL DATA:  Syncope and fall.  Facial laceration. EXAM: CT HEAD WITHOUT CONTRAST CT MAXILLOFACIAL WITHOUT CONTRAST CT CERVICAL SPINE WITHOUT CONTRAST TECHNIQUE: Multidetector CT imaging of the head, cervical spine, and maxillofacial structures were performed using the standard protocol without intravenous contrast. Multiplanar CT image reconstructions of the cervical spine and maxillofacial structures were also generated. COMPARISON:  CT  scan of February 19, 2004. FINDINGS: CT HEAD FINDINGS Bony calvarium appears intact. Mild chronic ischemic white matter disease is noted. Old right basal ganglia infarction is noted and stable. No mass effect or midline shift is noted. Ventricular size is within normal limits. There is no evidence of mass lesion, hemorrhage or acute infarction. CT MAXILLOFACIAL FINDINGS Globes and orbits appear normal. Paranasal sinuses appear normal. No fracture or other bony abnormality is noted. Pterygoid plates appear normal. CT CERVICAL SPINE FINDINGS No fracture is noted. Minimal grade 1 anterolisthesis of C4-5 is noted secondary to posterior facet joint hypertrophy. Disc spaces are well-maintained. Visualized lung apices appear intact. IMPRESSION: Mild chronic ischemic white matter disease. Old right basal ganglia infarction. No acute intracranial abnormality seen. No abnormality seen in the maxillofacial region. Minimal grade 1 anterolisthesis of C4-5 is noted secondary to posterior facet joint hypertrophy. No acute abnormality seen in the cervical spine. Electronically Signed   By: Marijo Conception, M.D.   On: 07/30/2015 13:50   Ct Cervical Spine Wo Contrast  07/30/2015  CLINICAL DATA:  Syncope and fall.  Facial laceration. EXAM: CT HEAD WITHOUT CONTRAST CT MAXILLOFACIAL WITHOUT CONTRAST CT CERVICAL SPINE WITHOUT CONTRAST TECHNIQUE: Multidetector CT imaging of the head, cervical spine, and maxillofacial structures were performed using the standard protocol without intravenous contrast. Multiplanar CT image reconstructions of the cervical spine and maxillofacial structures were also generated. COMPARISON:  CT scan of February 19, 2004. FINDINGS: CT HEAD FINDINGS Bony calvarium appears intact. Mild chronic ischemic white matter disease is noted. Old right basal ganglia infarction is noted and stable. No mass effect or midline shift is noted. Ventricular size is within normal limits. There is no evidence of mass lesion,  hemorrhage or acute infarction. CT MAXILLOFACIAL FINDINGS Globes and orbits appear normal. Paranasal sinuses appear normal. No fracture or other bony abnormality is noted. Pterygoid plates appear normal. CT CERVICAL SPINE FINDINGS No fracture is noted. Minimal grade 1 anterolisthesis of C4-5 is noted secondary to posterior facet joint hypertrophy. Disc spaces are well-maintained. Visualized lung apices appear intact. IMPRESSION: Mild chronic ischemic white matter disease. Old right basal ganglia infarction. No acute intracranial abnormality seen. No abnormality seen in the maxillofacial region. Minimal grade 1 anterolisthesis of C4-5 is noted secondary to posterior facet joint hypertrophy. No acute abnormality seen in the cervical spine. Electronically Signed   By: Marijo Conception, M.D.   On: 07/30/2015 13:50   Ct Abdomen Pelvis W Contrast  07/30/2015  CLINICAL DATA:  Left lower quadrant pain for 1 day EXAM: CT ABDOMEN AND PELVIS WITH CONTRAST TECHNIQUE: Multidetector CT imaging of the abdomen and pelvis was performed using the standard protocol following bolus administration of intravenous contrast. CONTRAST:  33mL ISOVUE-300 IOPAMIDOL (ISOVUE-300) INJECTION 61% COMPARISON:  01/20/2013 FINDINGS: Lung bases are free of acute  infiltrate or sizable effusion. The liver, gallbladder, adrenal glands and pancreas are within normal limits. The spleen demonstrates significant scarring consistent with the history of prior splenic infarction. The right kidney is well visualized and again demonstrates a renal cyst. No obstructive changes are seen. The left kidney demonstrates mottled enhancement consistent with renal infarction. On the coronal reconstructions on image number 91 of series 5 there is a filling defect identified in the distal left renal artery. Aortoiliac calcifications are noted without aneurysmal dilatation. The appendix has been surgically removed. Scattered diverticular change is noted without evidence of  diverticulitis. The bladder is well distended. A fat containing left inguinal hernia is noted. No pelvic mass lesion is seen. The osseous structures show degenerative changes of the lumbar spine. 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 Scarring in the spleen consistent with prior splenic infarct. Chronic changes without acute abnormality. Critical Value/emergent results were called by telephone at the time of interpretation on 07/30/2015 at 3:39 pm to [facility] , who verbally acknowledged these results. Electronically Signed   By: Inez Catalina M.D.   On: 07/30/2015 15:42   Ct Maxillofacial Wo Cm  07/30/2015  CLINICAL DATA:  Syncope and fall.  Facial laceration. EXAM: CT HEAD WITHOUT CONTRAST CT MAXILLOFACIAL WITHOUT CONTRAST CT CERVICAL SPINE WITHOUT CONTRAST TECHNIQUE: Multidetector CT imaging of the head, cervical spine, and maxillofacial structures were performed using the standard protocol without intravenous contrast. Multiplanar CT image reconstructions of the cervical spine and maxillofacial structures were also generated. COMPARISON:  CT scan of February 19, 2004. FINDINGS: CT HEAD FINDINGS Bony calvarium appears intact. Mild chronic ischemic white matter disease is noted. Old right basal ganglia infarction is noted and stable. No mass effect or midline shift is noted. Ventricular size is within normal limits. There is no evidence of mass lesion, hemorrhage or acute infarction. CT MAXILLOFACIAL FINDINGS Globes and orbits appear normal. Paranasal sinuses appear normal. No fracture or other bony abnormality is noted. Pterygoid plates appear normal. CT CERVICAL SPINE FINDINGS No fracture is noted. Minimal grade 1 anterolisthesis of C4-5 is noted secondary to posterior facet joint hypertrophy. Disc spaces are well-maintained. Visualized lung apices appear intact. IMPRESSION: Mild chronic ischemic white matter disease. Old right basal ganglia  infarction. No acute intracranial abnormality seen. No abnormality seen in the maxillofacial region. Minimal grade 1 anterolisthesis of C4-5 is noted secondary to posterior facet joint hypertrophy. No acute abnormality seen in the cervical spine. Electronically Signed   By: Marijo Conception, M.D.   On: 07/30/2015 13:50    Medications:   . aspirin EC  81 mg Oral Daily  . carvedilol  3.125 mg Oral BID WC  . feeding supplement (ENSURE ENLIVE)  237 mL Oral BID BM  . furosemide  20 mg Oral Daily  . pantoprazole  40 mg Oral Daily  . rivaroxaban  20 mg Oral Q supper  . rosuvastatin  40 mg Oral Daily  . sodium chloride flush  3 mL Intravenous Q12H   Continuous Infusions:   Time spent: 25 minutes.     [facility] Pager [phone]. If unable to reach me by pager, please call my cell phone at [phone].  *Please refer to [email or web address], password [email or web address] to get updated schedule on who will round on this patient, as hospitalists switch teams weekly. If 7PM-7AM, please contact night-coverage at [email or web address], password [email or web address] for any overnight needs.  08/01/2015, 7:48 AM

## 2015-08-01 NOTE — Consult Note (Signed)
Patient ID: Gregory [name] MRN: [id number], DOB/AGE: 80/01/1933   Admit date: 07/30/2015   Provider Requesting Consult: Dr. Jacquelynn Cree, Internal Medicine   Primary Physician: Lamar Blinks, MD Primary Cardiologist: Dr. Claiborne Billings  Pt. Profile:  Cardiology has been consulted to see Gregory [name] for syncope. He is a former patient of Dr. Rollene Fare. He is now followed by Dr. Claiborne Billings and has extensive cardiovascular history, including severe bilateral carotid artery disease as outlined below. He has refused carotid intervention in the past. Also with h/o CAD s/p CABG in 1993, h/o cardiomyopathy with recovery in EF to normal range and permanent atrial fibrillation on chronic anticoagulation with Xarelto.    Problem List  Past Medical History  Diagnosis Date  . CAD (coronary artery disease),CABG 1993-LIMA to the LAD, SVG to Om and SVG to PDA/PLA, patent on cath 2008. 2003  . CHF (congestive heart failure) ([place])   . Heart murmur   . Atrial fibrillation ([place])     permanent  . Hypertension   . High cholesterol   . Heart attack ([place]) 1980's  . Pneumonia     "in the last 5 yrs" (01/22/2013)  . GERD (gastroesophageal reflux disease)   . Stroke [facility])     "slightly drags left foot since; recovered qthing else" (01/21/2013)  . Kidney stones     "passed all but one time when he had to have lithotripsy" (01/21/2013)  . Cardiomyopathy ([place]) 2008    EF 20% but now improved to 40-45%  . 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 SSS (sick sinus syndrome) ([place])   . Pulmonary hypertension ([place])   . Arthritis   . Syncope 07/2015  . Appendicitis with abscess 03/31/2011    Past Surgical History  Procedure Laterality Date  . Laparoscopic appendectomy  03/30/2011    Procedure: APPENDECTOMY LAPAROSCOPIC;  Surgeon: Joyice Faster. Cornett, MD;  Location: [facility] OR;  Service: General;  Laterality: N/A;  . Cataract extraction w/ intraocular lens implant Bilateral 2013  . Esophagogastroduodenoscopy  02/01/2012    Procedure:  ESOPHAGOGASTRODUODENOSCOPY (EGD);  Surgeon: Beryle Beams, MD;  Location: [facility];  Service: Endoscopy;  Laterality: N/A;  . Coronary artery bypass graft  03/01/2002    LIMA to LAD, reverse SVG to OM, reverse SVG to PDA of RCA, reverse SVG to PLA of RCA, ligation of LA appendage (Dr. Servando Snare)  . Lithotripsy      "once" (01/21/2013)  . Tee without cardioversion N/A 01/23/2013    Procedure: TRANSESOPHAGEAL ECHOCARDIOGRAM (TEE);  Surgeon: Sanda Klein, MD;  Location: [facility] ENDOSCOPY;  Service: Cardiovascular;  Laterality: N/A;  . Transthoracic echocardiogram  06/23/2012    EF 40-45%, mild LVH; mild AV regurg; mild MV regurg; LV mod-severely dilated; RV mildly dilated; systolic pressure borderline increased; RA mod dilated  . Nm myocar perf wall motion  05/2009    persantine myoview - fixed moderate perfusion defect in inferior wall & lateral segment of apex (poor non-transmural infarction), minimal anterolateral periinfarct reversible ischemia seen, abnormal study, defects similar to 2006 study  . Coronary angioplasty  07/05/2006    3 vessel CAD, patent LIMA to LAD, patentVG to OM, patent SVG to PDA & PLA, mild MR, severe LV systolic dysfunction (Dr. Gerrie Nordmann)  . Cardiac catheterization  02/24/2002    reduced LV function, 60-70% prox RCA stenosis, 70% PLA ostial stenosis, 80% secondary branch of PLA stenosis - subsequent CABG (Dr. Jackie Plum)  . Carotid doppler  06/2012    50-69% right bulb/prox ICA diameter reduction; 70-99% left bulb/prox  ICA diameter reduction  . Splenectomy, total  01/2013  . Left heart catheterization with coronary angiogram N/A 01/24/2013    Procedure: LEFT HEART CATHETERIZATION WITH CORONARY ANGIOGRAM;  Surgeon: [name], MD;  Location[facility] C[facility] LAB;  Service: Cardiovascular;  Laterality: N/A;  Right heart with grafts  . Esophagogastroduodenoscopy N/A 05/22/2015    Procedure: ESOPHAGOGASTRODUODENOSCOPY (EGD);  Surgeon: Gatha Mayer, MD;  Location: [facility];   Service: Endoscopy;  Laterality: N/A;  . Balloon dilation N/A 05/22/2015    Procedure: BALLOON DILATION;  Surgeon: Gatha Mayer, MD;  Location: [facility] ENDOSCOPY;  Service: Endoscopy;  Laterality: N/A;     Allergies  Allergies  Allergen Reactions  . Percocet [Oxycodone-Acetaminophen] Itching and Nausea Only    HPI Cardiology has been consulted to see [name] for syncope. He is a former patient of Dr. Rollene Fare. He is now followed by Dr. Claiborne Billings and has extensive cardiovascular history, including severe bilateral carotid artery disease and CAD as outlined below.  [name] has a history of known CAD and in 2003, he underwent CABG revascularization surgery by Dr. Servando Snare (LIMA to LAD, vein graft to the obtuse marginal, sequential vein graft to the PDA and PLA of RCA). His last cardiac catheterization was in 2008 done by Dr. Tami Ribas. At that time, ejection fraction was 20-25%. He declined consideration for ICD. Ejection fraction improved to approximately 35-40% in September 2012 on medical therapy. Also with a h/o permanent atrial fibrillation. In the past, he refused Coumadin therapy however was found to have a splenic infarct of embolic etiology in [id number]. He underwent a transesophageal echocardiogram on 01/23/2013 by Dr. Sallyanne Kuster which revealed an ejection fraction in the range of 15-25%. There was diffuse hypokinesis with severe hypokinesis to akinesis of the anteroseptal apical myocardium. He had increased left ventricular end-diastolic filling pressure and elevated left atrial filling pressure. There was evidence for large solid fixed thrombus occupying the entire atrial appendage with moderate continuous spontaneous echo contrast ("smoke") in the left atrial cavity. There was no evidence for right atrial thrombus. Since that time, he has been on Xarelto for anticoagulation. He refused to consider a LifeVest. He had a repeat 2D echo 06/14/13 which showed improvement in EF back to normal range at  50-55%.  He also has a h/o severe bilateral carotid artery disease. He had been seen by Dr. Oneida Alar. His prior carotid duplex scan was significantly abnormal with his left carotid velocities increased to [id number] systolic and [id number] diastolic with severe fibrous plaque and elevated velocities within all segments of his left internal carotid. There also was 50-69% diameter reduction in the right bulb/proximal ICA.  A follow-up Doppler study on 08/23/2014 showed increased velocities in the left at 492 over 148 at the MICA level and to 30/67, at the PICA level. On the right, his PICA velocity was 292/78. When compared to his prior study. This had not increased and he had remained fairly stable w/o symptoms.  Per records, he has refused to consider any potential carotid intervention or surgery.  The patient presented to [facility] ED on 07/30/15 after sustaining a syncopal spell at home, resulting in a fall with superficial facial trauma. Patient noted feeling nauseated with left sided flank and LLQ pain prior to episode. He denies any focal neuro weakness or paresthesia, no chest pains, no SOB, no dizziness. His family was present and witnessed the episode and denies any seizure activity. Luckily, he had held his Xarelto for 3 days due to anticipated spinal steroid injection.  On arrival to ED, STAT head and spine CT showed no acute abnormalities, including no fractures, mass lesion, hemorrhage or acute infarct. Abdominal/ Pelvic CT showed left renal infarct. No arm/ wrist fractures.  Admission EKG showed chronic atrial fibrillation with a CVR in the 60s with ventricular bigeminy. Cardiac enzymes were cycled x 3 and were all negative. 2D echo shows normal LVF with EF of 55-60%. Wall motion is normal w/o regional wall motion abnormalties. Aortic valve mobility is restricted but no AS and only mild AI noted. Mild MR and TR also noted. CBC negative for anemia. Hgb stable at 13.0. BMP suggest either AKI vs new chronic renal  insufficiency. SCr on admit was 1.83 (compared to 1.11 9 months ago). K WNL at 4.0. F/u carotid dopplers pending.   He denies any recurrent syncope/ near syncope since admission. No recent CP or tachy palpitations. Other than holding Xarelto for 3 days for injection, he reports being compliant with Xarelto. Also compliant with ASA and Crestor.     Home Medications  Prior to Admission medications   Medication Sig Start Date End Date Taking? Authorizing Provider  acetaminophen (TYLENOL) 500 MG tablet Take 500 mg by mouth every 6 (six) hours as needed for mild pain.    Yes Historical Provider, MD  aspirin EC 81 MG tablet Take 81 mg by mouth daily.   Yes Historical Provider, MD  carvedilol (COREG) 3.125 MG tablet TAKE 3 TABLETS BY MOUTH TWICE DAILY WITH MEALS 07/29/15  Yes Troy Sine, MD  furosemide (LASIX) 40 MG tablet Take 0.5 tablets (20 mg total) by mouth daily. 08/20/14  Yes Troy Sine, MD  lisinopril (PRINIVIL,ZESTRIL) 10 MG tablet TAKE 1 AND 1/2 TABLETS(15 MG) BY MOUTH DAILY 06/26/15  Yes Troy Sine, MD  pantoprazole (PROTONIX) 40 MG tablet TAKE 1 TABLET BY MOUTH EVERY DAY 12/03/14  Yes Troy Sine, MD  rivaroxaban (XARELTO) 20 MG TABS tablet Take 1 tablet (20 mg total) by mouth daily with supper. Resume 05/23/2015 05/22/15  Yes Gatha Mayer, MD  rosuvastatin (CRESTOR) 40 MG tablet Take 1 tablet (40 mg total) by mouth daily. 03/06/15  Yes Troy Sine, MD  traMADol (ULTRAM) 50 MG tablet Take a 1/2 tablet every 8 hours as needed for pain Patient taking differently: Take 25 mg by mouth every 8 (eight) hours as needed for moderate pain. Take a 1/2 tablet every 8 hours as needed for pain 07/24/15  Yes Darreld Mclean, MD    Family History  Family History  Problem Relation Age of Onset  . Heart disease Father     rheumatic fever  . Heart attack Brother 33  . Stroke Mother   . Stroke Brother 31    Social History  Social History   Social History  . Marital Status: Married     Spouse Name: N/A  . Number of Children: 30  . Years of Education: 8   Occupational History  . retired    Social History Main Topics  . Smoking status: Former Smoker -- 2.00 packs/day for 20 years    Types: Cigarettes    Quit date: 04/08/1986  . Smokeless tobacco: Current User    Types: Chew  . Alcohol Use: No  . Drug Use: No  . Sexual Activity: Not on file   Other Topics Concern  . Not on file   Social History Narrative   A she is married, 5 sons 4 daughters. He is retired Horticulturist, commercial. 2-3 caffeinated beverages  a day no alcohol     Review of Systems General:  No chills, fever, night sweats or weight changes.  Cardiovascular:  No chest pain, dyspnea on exertion, edema, orthopnea, palpitations, paroxysmal nocturnal dyspnea. Dermatological: No rash, lesions/masses Respiratory: No cough, dyspnea Urologic: No hematuria, dysuria Abdominal:   No nausea, vomiting, diarrhea, bright red blood per rectum, melena, or hematemesis Neurologic:  No visual changes, wkns, changes in mental status. All other systems reviewed and are otherwise negative except as noted above.  Physical Exam  Blood pressure 118/43, pulse 74, temperature 99 F (37.2 C), temperature source Oral, resp. rate 18, height 5\' 6"  (1.676 m), weight 192 lb 3.2 oz (87.181 kg), SpO2 95 %.  General: Pleasant, NAD, abrasions on forehead and nose from fall, elderly Psych: Normal affect. Neuro: Alert and oriented X 3. Moves all extremities spontaneously. HEENT: Normal, skin abrasion on nose  Neck: no JVD. + bilateral carotid bruits. Lungs:  Resp regular and unlabored, CTA. Heart: irregularly, irregular, normal rate no s3, s4, or murmurs. Abdomen: Soft, non-tender, non-distended, BS + x 4.  Extremities: No clubbing, cyanosis or edema. DP/PT/Radials 2+ and equal bilaterally. Abrasions on left upper extremity from fall  Labs  Troponin [facility] of Care Test)  Recent Labs  07/30/15 1411  TROPIPOC 0.01    Recent Labs   07/30/15 1836 07/31/15 0141 07/31/15 0838  TROPONINI <0.03 <0.03 <0.03   Lab Results  Component Value Date   WBC 12.5* 07/30/2015   HGB 13.0 07/30/2015   HCT 39.5 07/30/2015   MCV 86.1 07/30/2015   PLT 214 07/30/2015     Recent Labs Lab 07/31/15 0838  NA 137  K 4.1  CL 99*  CO2 26  BUN 23*  CREATININE 1.85*  CALCIUM 8.7*  GLUCOSE 118*   Lab Results  Component Value Date   CHOL 167 07/26/2014   HDL 45 07/26/2014   LDLCALC 106* 07/26/2014   TRIG 82 07/26/2014   No results found for: DDIMER   Radiology/Studies  Dg Chest 2 View  07/30/2015  CLINICAL DATA:  Syncope with fall EXAM: CHEST  2 VIEW COMPARISON:  February 09, 2014 FINDINGS: Lungs are clear. Heart is enlarged with pulmonary vascularity within normal limits. Patient is status post coronary artery bypass grafting. No adenopathy. No bone lesions. There is degenerative change in the lower thoracic and upper lumbar spine regions. IMPRESSION: Cardiomegaly.  No edema or consolidation. Electronically Signed   By: Lowella Grip III M.D.   On: 07/30/2015 13:53   Dg Elbow Complete Left  07/30/2015  CLINICAL DATA:  [place] today.  Syncope.  Skin tear on the elbow. EXAM: LEFT ELBOW - COMPLETE 3+ VIEW COMPARISON:  None. FINDINGS: Negative for a fracture or dislocation. Mild spurring and degenerative changes in the proximal ulna. Mild spurring or enthesopathic changes involving the lateral humeral epicondyle. Irregularity of the soft tissue along the lateral aspect of the elbow. IMPRESSION: No acute bone abnormality to the left elbow. Electronically Signed   By: Markus Daft M.D.   On: 07/30/2015 13:59   Dg Wrist Complete Right  07/30/2015  CLINICAL DATA:  Right wrist pain, fell today onto cement EXAM: RIGHT WRIST - COMPLETE 3+ VIEW COMPARISON:  None. FINDINGS: Four views of the right wrist submitted. No acute fracture or subluxation. There is degenerative narrowing of radiocarpal joint space. Atherosclerotic vascular calcifications  are noted. Erosive degenerative changes are noted triquetrum. IMPRESSION: No acute fracture or subluxation. Degenerative changes as described above. Electronically Signed   By: Julien Girt  Pop M.D.   On: 07/30/2015 14:08   Ct Head Wo Contrast  07/30/2015  CLINICAL DATA:  Syncope and fall.  Facial laceration. EXAM: CT HEAD WITHOUT CONTRAST CT MAXILLOFACIAL WITHOUT CONTRAST CT CERVICAL SPINE WITHOUT CONTRAST TECHNIQUE: Multidetector CT imaging of the head, cervical spine, and maxillofacial structures were performed using the standard protocol without intravenous contrast. Multiplanar CT image reconstructions of the cervical spine and maxillofacial structures were also generated. COMPARISON:  CT scan of February 19, 2004. FINDINGS: CT HEAD FINDINGS Bony calvarium appears intact. Mild chronic ischemic white matter disease is noted. Old right basal ganglia infarction is noted and stable. No mass effect or midline shift is noted. Ventricular size is within normal limits. There is no evidence of mass lesion, hemorrhage or acute infarction. CT MAXILLOFACIAL FINDINGS Globes and orbits appear normal. Paranasal sinuses appear normal. No fracture or other bony abnormality is noted. Pterygoid plates appear normal. CT CERVICAL SPINE FINDINGS No fracture is noted. Minimal grade 1 anterolisthesis of C4-5 is noted secondary to posterior facet joint hypertrophy. Disc spaces are well-maintained. Visualized lung apices appear intact. IMPRESSION: Mild chronic ischemic white matter disease. Old right basal ganglia infarction. No acute intracranial abnormality seen. No abnormality seen in the maxillofacial region. Minimal grade 1 anterolisthesis of C4-5 is noted secondary to posterior facet joint hypertrophy. No acute abnormality seen in the cervical spine. Electronically Signed   By: Marijo Conception, M.D.   On: 07/30/2015 13:50   Ct Cervical Spine Wo Contrast  07/30/2015  CLINICAL DATA:  Syncope and fall.  Facial laceration. EXAM: CT  HEAD WITHOUT CONTRAST CT MAXILLOFACIAL WITHOUT CONTRAST CT CERVICAL SPINE WITHOUT CONTRAST TECHNIQUE: Multidetector CT imaging of the head, cervical spine, and maxillofacial structures were performed using the standard protocol without intravenous contrast. Multiplanar CT image reconstructions of the cervical spine and maxillofacial structures were also generated. COMPARISON:  CT scan of February 19, 2004. FINDINGS: CT HEAD FINDINGS Bony calvarium appears intact. Mild chronic ischemic white matter disease is noted. Old right basal ganglia infarction is noted and stable. No mass effect or midline shift is noted. Ventricular size is within normal limits. There is no evidence of mass lesion, hemorrhage or acute infarction. CT MAXILLOFACIAL FINDINGS Globes and orbits appear normal. Paranasal sinuses appear normal. No fracture or other bony abnormality is noted. Pterygoid plates appear normal. CT CERVICAL SPINE FINDINGS No fracture is noted. Minimal grade 1 anterolisthesis of C4-5 is noted secondary to posterior facet joint hypertrophy. Disc spaces are well-maintained. Visualized lung apices appear intact. IMPRESSION: Mild chronic ischemic white matter disease. Old right basal ganglia infarction. No acute intracranial abnormality seen. No abnormality seen in the maxillofacial region. Minimal grade 1 anterolisthesis of C4-5 is noted secondary to posterior facet joint hypertrophy. No acute abnormality seen in the cervical spine. Electronically Signed   By: Marijo Conception, M.D.   On: 07/30/2015 13:50   Ct Abdomen Pelvis W Contrast  07/30/2015  CLINICAL DATA:  Left lower quadrant pain for 1 day EXAM: CT ABDOMEN AND PELVIS WITH CONTRAST TECHNIQUE: Multidetector CT imaging of the abdomen and pelvis was performed using the standard protocol following bolus administration of intravenous contrast. CONTRAST:  63mL ISOVUE-300 IOPAMIDOL (ISOVUE-300) INJECTION 61% COMPARISON:  01/20/2013 FINDINGS: Lung bases are free of acute  infiltrate or sizable effusion. The liver, gallbladder, adrenal glands and pancreas are within normal limits. The spleen demonstrates significant scarring consistent with the history of prior splenic infarction. The right kidney is well visualized and again demonstrates a renal cyst. No obstructive  changes are seen. The left kidney demonstrates mottled enhancement consistent with renal infarction. On the coronal reconstructions on image number 91 of series 5 there is a filling defect identified in the distal left renal artery. Aortoiliac calcifications are noted without aneurysmal dilatation. The appendix has been surgically removed. Scattered diverticular change is noted without evidence of diverticulitis. The bladder is well distended. A fat containing left inguinal hernia is noted. No pelvic mass lesion is seen. The osseous structures show degenerative changes of the lumbar spine. 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 Scarring in the spleen consistent with prior splenic infarct. Chronic changes without acute abnormality. Critical Value/emergent results were called by telephone at the time of interpretation on 07/30/2015 at 3:39 pm to [facility] , who verbally acknowledged these results. Electronically Signed   By: Inez Catalina M.D.   On: 07/30/2015 15:42   Ct Maxillofacial Wo Cm  07/30/2015  CLINICAL DATA:  Syncope and fall.  Facial laceration. EXAM: CT HEAD WITHOUT CONTRAST CT MAXILLOFACIAL WITHOUT CONTRAST CT CERVICAL SPINE WITHOUT CONTRAST TECHNIQUE: Multidetector CT imaging of the head, cervical spine, and maxillofacial structures were performed using the standard protocol without intravenous contrast. Multiplanar CT image reconstructions of the cervical spine and maxillofacial structures were also generated. COMPARISON:  CT scan of February 19, 2004. FINDINGS: CT HEAD FINDINGS Bony calvarium appears intact. Mild chronic ischemic white  matter disease is noted. Old right basal ganglia infarction is noted and stable. No mass effect or midline shift is noted. Ventricular size is within normal limits. There is no evidence of mass lesion, hemorrhage or acute infarction. CT MAXILLOFACIAL FINDINGS Globes and orbits appear normal. Paranasal sinuses appear normal. No fracture or other bony abnormality is noted. Pterygoid plates appear normal. CT CERVICAL SPINE FINDINGS No fracture is noted. Minimal grade 1 anterolisthesis of C4-5 is noted secondary to posterior facet joint hypertrophy. Disc spaces are well-maintained. Visualized lung apices appear intact. IMPRESSION: Mild chronic ischemic white matter disease. Old right basal ganglia infarction. No acute intracranial abnormality seen. No abnormality seen in the maxillofacial region. Minimal grade 1 anterolisthesis of C4-5 is noted secondary to posterior facet joint hypertrophy. No acute abnormality seen in the cervical spine. Electronically Signed   By: Marijo Conception, M.D.   On: 07/30/2015 13:50    ECG  Chronic atrial fibrillation with CVR. 63bpm. PVCs  Echocardiogram  Study Conclusions  - Left ventricle: The cavity size was normal. Wall thickness was  normal. Systolic function was normal. The estimated ejection  fraction was in the range of 55% to 60%. Wall motion was normal;  there were no regional wall motion abnormalities. The study is  not technically sufficient to allow evaluation of LV diastolic  function. - Aortic valve: Valve mobility was restricted. There was mild  regurgitation. - Aorta: Aortic root dimension: 41 mm (ED). - Ascending aorta: The ascending aorta was mildly dilated. - Mitral valve: Calcified annulus. There was mild regurgitation. - Left atrium: The atrium was mildly dilated. Anterior-posterior  dimension: 46 mm.  Impressions:  - Compared to the prior study, there has been no significant  interval change.    ASSESSMENT AND  PLAN  Principal Problem:   Syncope Active Problems:   Chronic a-fib ([facility])   Anticoagulated on Xarelto   Renal infarction ([place])    1. Syncope: Likely not cardiac related given normal cardiac enzymes x 3, lack of chest pain, nonischemic EKG and normal 2D echo with normal EF,  wall motion, absence of significant valve abnormalities and no signs of pericardial effusion. EKGs and telemetry have shown stable, rate controlled, atrial fibrillation w/o any significant bradycardia, pauses or tachyrhythmias. Given presence or severe abdominal pain around the time of event, suspect possible vagal induced syncope. Severe obstructive carotid artery disease may also be a potential possibility, however patient continues to refuse invasive/surgical intervention. F/u dopplers pending.   2. Left Renal Infarction: occurred in the setting of Xarelto being held for anticipated spinal injection. With chronic afib. Xarelto restarted. Also on ASA, 81 mg.   3. Bilateral Carotid Artery Disease: f/u dopplers ordered. He has been seen in the past by Dr. Oneida Alar. Per records, he has refused to consider any potential carotid intervention or surgery.We rediscussed this, and he remains adamant that he wants to continue conservative management. Continue Xartelto, ASA and Crestor.   4. CAD: s/p CABG in 2003. Stable w/o anginal symptomatology. Cardiac enzymes negative x 3. 2D echo with normal wall motion and normal EF. Continue medical therapy for secondary prevention: ASA, BB and statin.   5. Permanent Atrial Fibrillation: rate controlled with Coreg. Asymptomatic. Continue Xarelto for stroke prophylaxis.    Signed, Lyda Jester, PA-C 08/01/2015, 11:38 AM   I have seen, examined and evaluated the patient this PM after he was seen by Ms. Rosita Fire, [place].  After reviewing all the available data and chart, we discussed the patients laboratory, study & physical findings as well as symptoms in detail. I agree with her findings,  examination as well as impression recommendations as per our discussion.    Mr. Farrer presented with a syncopal episode which coincided with an episode of excruciating left flank pain most likely symptomatic of his renal cardioembolic infarct. He apparently had been off of his Xarelto last week for a back injection. Quite likely he had a left atrial thrombus formed during that and had a cardioembolic event. He then syncopized which in this setting is quite clearly related to vasovagal syncope after excruciating pain. I think simply restarting Xarelto for stroke and cardiac embolic phenomenon prophylaxis is warranted. We will need to monitor his renal function to ensure that we appropriately dosed, but his renal function prior to this was normal.  Otherwise he stable from an atrial fibrillation standpoint and is not having any anginal or heart failure symptoms. I would imagine he'll be in the hospital until his pain is controlled. I would simply restart his anticoagulation and monitor return of renal function.  With bilateral carotid artery disease, vasovagal symptoms would be much more likely to cause a syncopal episode.   Glenetta Hew, M.D., M.S. Interventional Cardiologist   Pager # [phone] Phone # [phone] [address] [place], [place] [address]

## 2015-08-02 ENCOUNTER — Other Ambulatory Visit: Payer: Self-pay | Admitting: Cardiology

## 2015-08-02 ENCOUNTER — Telehealth: Payer: Self-pay | Admitting: Cardiology

## 2015-08-02 DIAGNOSIS — N280 Ischemia and infarction of kidney: Secondary | ICD-10-CM | POA: Diagnosis not present

## 2015-08-02 DIAGNOSIS — I2510 Atherosclerotic heart disease of native coronary artery without angina pectoris: Secondary | ICD-10-CM

## 2015-08-02 DIAGNOSIS — I6523 Occlusion and stenosis of bilateral carotid arteries: Secondary | ICD-10-CM | POA: Diagnosis not present

## 2015-08-02 DIAGNOSIS — I482 Chronic atrial fibrillation: Secondary | ICD-10-CM | POA: Diagnosis not present

## 2015-08-02 DIAGNOSIS — I6529 Occlusion and stenosis of unspecified carotid artery: Secondary | ICD-10-CM | POA: Diagnosis not present

## 2015-08-02 DIAGNOSIS — R55 Syncope and collapse: Secondary | ICD-10-CM | POA: Diagnosis not present

## 2015-08-02 DIAGNOSIS — N183 Chronic kidney disease, stage 3 (moderate): Secondary | ICD-10-CM

## 2015-08-02 DIAGNOSIS — I25118 Atherosclerotic heart disease of native coronary artery with other forms of angina pectoris: Secondary | ICD-10-CM | POA: Diagnosis not present

## 2015-08-02 DIAGNOSIS — Z7901 Long term (current) use of anticoagulants: Secondary | ICD-10-CM | POA: Diagnosis not present

## 2015-08-02 MED ORDER — HYDROCODONE-ACETAMINOPHEN 5-325 MG PO TABS: 1.0000 | ORAL_TABLET | ORAL | Status: DC | PRN

## 2015-08-02 MED ORDER — RIVAROXABAN 15 MG PO TABS: 15.0000 mg | ORAL_TABLET | Freq: Every day | ORAL | Status: DC

## 2015-08-02 MED ORDER — ENSURE ENLIVE PO LIQD: 237.0000 mL | Freq: Two times a day (BID) | ORAL | Status: DC

## 2015-08-02 NOTE — Progress Notes (Signed)
Physical Therapy Treatment Patient Details Name: Gregory [name] MRN: [id number] DOB: 12-17-1932 Today's Date: 2015/08/07    History of Present Illness 82 y.o. male with PMH of CAD h/o CABG, CHF (EF 20%-2008. delined ICD), Sinus Node dysfunction, PAF (could not tolerate coumadin) complicate with splenic infarction, started on xarelto due to large left appendage thrombus (2014) presented with syncope. Dx of renal infarct.     PT Comments    Progressing with ambulation tolerance and able to practice steps today prior to d/c.  Will have appropriate level of assist (as asserted by family in the room) and likely progress at home without follow up PT.   Follow Up Recommendations  No PT follow up     Equipment Recommendations  None recommended by PT    Recommendations for Other Services       Precautions / Restrictions Precautions Precautions: Fall    Mobility  Bed Mobility Overal bed mobility: Modified Independent                Transfers   Equipment used: None Transfers: Sit to/from Stand Sit to Stand: Supervision         General transfer comment: for safety  Ambulation/Gait Ambulation/Gait assistance: Min guard;Supervision Ambulation Distance (Feet): 250 Feet Assistive device: None Gait Pattern/deviations: Step-through pattern;Decreased stride length;Wide base of support;Shuffle     General Gait Details: at times with shorter shuffling pattern, then times with longer strides and improved balance   Stairs Stairs: Yes Stairs assistance: Min assist Stair Management: One rail Right;Alternating pattern;Forwards Number of Stairs: 3 General stair comments: assist for safety, cues for getting foot all the way onto stair  Wheelchair Mobility    Modified Rankin (Stroke Patients Only)       Balance Overall balance assessment: Needs assistance           Standing balance-Leahy Scale: Good                      Cognition Arousal/Alertness:  Awake/alert Behavior During Therapy: WFL for tasks assessed/performed Overall Cognitive Status: Within Functional Limits for tasks assessed                      Exercises      General Comments        Pertinent Vitals/Pain Pain Assessment: No/denies pain    Home Living                      Prior Function            PT Goals (current goals can now be found in the care plan section) Progress towards PT goals: Progressing toward goals    Frequency  Min 3X/week    PT Plan Current plan remains appropriate    Co-evaluation             End of Session Equipment Utilized During Treatment: Gait belt Activity Tolerance: Patient tolerated treatment well Patient left: in bed;with call bell/phone within reach;with family/visitor present     Time: 0920-0946 PT Time Calculation (min) (ACUTE ONLY): 26 min  Charges:  $Gait Training: 8-22 mins                    G Codes:      Reginia Naas 2015/08/07, 12:10 PM  Magda Kiel, [place] Aug 07, 2015

## 2015-08-02 NOTE — Progress Notes (Signed)
Pt as well as family (wife, & 2 dgtrs) given discharge instructions. Pt as well as family educated on when to call the MD, medications, f/u appointments, as well as the importance of daily wts with his CHF. Pt & family also given education on dressing changes & care for his skin tear. All questions & concerns were addressed. Pt's IV has been removed & CCMD was notified of pt being discharged. Pt asked as well as his room was checked to be sure all personal belongings were sent home (dentures, glasses & clothes). Pt was discharged. Hoover Brunette, RN

## 2015-08-02 NOTE — Discharge Instructions (Addendum)
Information on my medicine - XARELTO (Rivaroxaban)  This medication education was reviewed with me or my healthcare representative as part of my discharge preparation.    Why was Xarelto prescribed for you? Xarelto was prescribed for you to reduce the risk of a blood clot forming that can cause a stroke if you have a medical condition called atrial fibrillation (a type of irregular heartbeat).  What do you need to know about xarelto ? Take your Xarelto ONCE DAILY at the same time every day with your evening meal. If you have difficulty swallowing the tablet whole, you may crush it and mix in applesauce just prior to taking your dose.  Take Xarelto exactly as prescribed by your doctor and DO NOT stop taking Xarelto without talking to the doctor who prescribed the medication.  Stopping without other stroke prevention medication to take the place of Xarelto may increase your risk of developing a clot that causes a stroke.  Refill your prescription before you run out.  After discharge, you should have regular check-up appointments with your healthcare provider that is prescribing your Xarelto.  In the future your dose may need to be changed if your kidney function or weight changes by a significant amount.  What do you do if you miss a dose? If you are taking Xarelto ONCE DAILY and you miss a dose, take it as soon as you remember on the same day then continue your regularly scheduled once daily regimen the next day. Do not take two doses of Xarelto at the same time or on the same day.   Important Safety Information A possible side effect of Xarelto is bleeding. You should call your healthcare provider right away if you experience any of the following: ? Bleeding from an injury or your nose that does not stop. ? Unusual colored urine (red or dark brown) or unusual colored stools (red or black). ? Unusual bruising for unknown reasons. ? A serious fall or if you hit your head (even if  there is no bleeding).  Some medicines may interact with Xarelto and might increase your risk of bleeding while on Xarelto. To help avoid this, consult your healthcare provider or pharmacist prior to using any new prescription or non-prescription medications, including herbals, vitamins, non-steroidal anti-inflammatory drugs (NSAIDs) and supplements.  This website has more information on Xarelto: [email or web address].[email or web address]   Syncope, commonly known as fainting, is a temporary loss of consciousness. It occurs when the blood flow to the brain is reduced. Vasovagal syncope (also called neurocardiogenic syncope) is a fainting spell in which the blood flow to the brain is reduced because of a sudden drop in heart rate and blood pressure. Vasovagal syncope occurs when the brain and the cardiovascular system (blood vessels) do not adequately communicate and respond to each other. This is the most common cause of fainting. It often occurs in response to fear or some other type of emotional or physical stress. The body has a reaction in which the heart starts beating too slowly or the blood vessels expand, reducing blood pressure. This type of fainting spell is generally considered harmless. However, injuries can occur if a person takes a sudden fall during a fainting spell.  CAUSES  Vasovagal syncope occurs when a person's blood pressure and heart rate decrease suddenly, usually in response to a trigger. Many things and situations can trigger an episode. Some of these include:   Pain.   Fear.   The sight of blood or medical procedures, such as blood  being drawn from a vein.   Common activities, such as coughing, swallowing, stretching, or going to the bathroom.   Emotional stress.   Prolonged standing, especially in a warm environment.   Lack of sleep or rest.   Prolonged lack of food.   Prolonged lack of fluids.   Recent illness.  The use of certain drugs that affect blood pressure, such as  cocaine, alcohol, marijuana, inhalants, and opiates.  SYMPTOMS  Before the fainting episode, you may:   Feel dizzy or light headed.   Become pale.  Sense that you are going to faint.   Feel like the room is spinning.   Have tunnel vision, only seeing directly in front of you.   Feel sick to your stomach (nauseous).   See spots or slowly lose vision.   Hear ringing in your ears.   Have a headache.   Feel warm and sweaty.   Feel a sensation of pins and needles. During the fainting spell, you will generally be unconscious for no longer than a couple minutes before waking up and returning to normal. If you get up too quickly before your body can recover, you may faint again. Some twitching or jerky movements may occur during the fainting spell.  DIAGNOSIS  Your health care provider will ask about your symptoms, take a medical history, and perform a physical exam. Various tests may be done to rule out other causes of fainting. These may include blood tests and tests to check the heart, such as electrocardiography, echocardiography, and possibly an electrophysiology study. When other causes have been ruled out, a test may be done to check the body's response to changes in position (tilt table test). TREATMENT  Most cases of vasovagal syncope do not require treatment. Your health care provider may recommend ways to avoid fainting triggers and may provide home strategies for preventing fainting. If you must be exposed to a possible trigger, you can drink additional fluids to help reduce your chances of having an episode of vasovagal syncope. If you have warning signs of an oncoming episode, you can respond by positioning yourself favorably (lying down). If your fainting spells continue, you may be given medicines to prevent fainting. Some medicines may help make you more resistant to repeated episodes of vasovagal syncope. Special exercises or compression stockings may be recommended.  In rare cases, the surgical placement of a pacemaker is considered. HOME CARE INSTRUCTIONS   Learn to identify the warning signs of vasovagal syncope.   Sit or lie down at the first warning sign of a fainting spell. If sitting, put your head down between your legs. If you lie down, swing your legs up in the air to increase blood flow to the brain.   Avoid hot tubs and saunas.  Avoid prolonged standing.  Drink enough fluids to keep your urine clear or pale yellow. Avoid caffeine.  Increase salt in your diet as directed by your health care provider.   If you have to stand for a long time, perform movements such as:   Crossing your legs.   Flexing and stretching your leg muscles.   Squatting.   Moving your legs.   Bending over.   Only take over-the-counter or prescription medicines as directed by your health care provider. Do not suddenly stop any medicines without asking your health care provider first. [place] IF:   Your fainting spells continue or happen more frequently in spite of treatment.   You lose consciousness for more than  a couple minutes.  You have fainting spells during or after exercising or after being startled.   You have new symptoms that occur with the fainting spells, such as:   Shortness of breath.  Chest pain.   Irregular heartbeat.   You have episodes of twitching or jerky movements that last longer than a few seconds.  You have episodes of twitching or jerky movements without obvious fainting. SEEK IMMEDIATE MEDICAL CARE IF:   You have injuries or bleeding after a fainting spell.   You have episodes of twitching or jerky movements that last longer than 5 minutes.   You have more than one spell of twitching or jerky movements before returning to consciousness after fainting.   This information is not intended to replace advice given to you by your health care provider. Make sure you discuss any questions you have  with your health care provider.   Document Released: 02/17/2012 Document Revised: 07/17/2014 Document Reviewed: 02/17/2012 Elsevier Interactive Patient Education 2016 [place] will have lab work 08/09/15 after you see Melina Copa  Dr. Evette Georges PA this is at the [facility].  See address.

## 2015-08-02 NOTE — Progress Notes (Signed)
Pt up & walked with RN around the circle of the nursing station so about 100 ft. Pt c/o of his legs being tired but otherwise he did well. Hoover Brunette, RN

## 2015-08-02 NOTE — Telephone Encounter (Signed)
New message     [facility] on May 26 th per Cecilie Kicks

## 2015-08-02 NOTE — Progress Notes (Signed)
Subjective: No complaints  Objective: Vital signs in last 24 hours: Temp:  [98 F (36.7 C)-99.6 F (37.6 C)] 99.6 F (37.6 C) (05/19 0500) Pulse Rate:  [69-90] 71 (05/19 0813) Resp:  [16] 16 (05/19 0500) BP: (111-130)/(49-59) 122/53 mmHg (05/19 0813) SpO2:  [94 %] 94 % (05/19 0500) Weight:  [191 lb (86.637 kg)] 191 lb (86.637 kg) (05/19 0500) Weight change: -1 lb 3.2 oz (-0.544 kg)   Intake/Output from previous day: -400 05/18 0701 - 05/19 0700 In: -  Out: 400 [Urine:400] Intake/Output this shift:    PE: General:Pleasant affect, NAD Skin:Warm and dry, brisk capillary refill, abrasions of face HEENT:normocephalic, sclera clear, mucus membranes moist Heart:irreg but controlled rate without murmur, gallup, rub or click Lungs:clear without rales, rhonchi, or wheezes [id number], non tender, + BS, do not palpate liver spleen or masses Ext:no lower ext edema, 2+ pedal pulses, 2+ radial pulses Neuro:alert and oriented X 3, MAE, follows commands, + facial symmetry Tele:  A fib with PVCs. Rate controlled  Lab Results:  Recent Labs  07/30/15 1400  WBC 12.5*  HGB 13.0  HCT 39.5  PLT 214   BMET  Recent Labs  07/30/15 1400 07/31/15 0838  NA 136 137  K 4.0 4.1  CL 99* 99*  CO2 27 26  GLUCOSE 107* 118*  BUN 23* 23*  CREATININE 1.83* 1.85*  CALCIUM 8.9 8.7*    Recent Labs  07/31/15 0141 07/31/15 0838  TROPONINI <0.03 <0.03    Lab Results  Component Value Date   CHOL 167 07/26/2014   HDL 45 07/26/2014   LDLCALC 106* 07/26/2014   TRIG 82 07/26/2014   CHOLHDL 3.7 07/26/2014   Lab Results  Component Value Date   HGBA1C 6.0* 07/13/2013     Lab Results  Component Value Date   TSH 4.403 01/23/2014      Studies/Results: No results found.  Medications: I have reviewed the patient's current medications. Scheduled Meds: . aspirin EC  81 mg Oral Daily  . carvedilol  3.125 mg Oral BID WC  . feeding supplement (ENSURE ENLIVE)  237 mL Oral BID  BM  . furosemide  20 mg Oral Daily  . pantoprazole  40 mg Oral Daily  . rivaroxaban  15 mg Oral Q supper  . rosuvastatin  40 mg Oral Daily  . sodium chloride flush  3 mL Intravenous Q12H   Continuous Infusions:  PRN Meds:.HYDROcodone-acetaminophen, morphine injection, ondansetron (ZOFRAN) IV **OR** ondansetron  Assessment/Plan: 82 year old male with extensive cardiovascular history, including severe bilateral carotid artery disease as outlined below. He has refused carotid intervention in the past. Also with h/o CAD s/p CABG in 1993, h/o cardiomyopathy with recovery in EF to normal range and permanent atrial fibrillation on chronic anticoagulation with Xarelto.  Now admitted with syncope and Lt renal infarct.   Principal Problem:   Syncope Active Problems:   Chronic a-fib (HCC)   Anticoagulated on Xarelto   Carotid stenosis   Renal infarction [facility])   Vasovagal syncope   Coronary artery disease involving native coronary artery  1. Syncope: Likely not cardiac related given normal cardiac enzymes x 3, lack of chest pain, nonischemic EKG and normal 2D echo with normal EF, wall motion, absence of significant valve abnormalities and no signs of pericardial effusion. EKGs and telemetry have shown stable, rate controlled, atrial fibrillation w/o any significant bradycardia, pauses or tachyrhythmias. Given presence or severe abdominal pain around the time of event, suspect possible vagal  induced syncope. Severe obstructive carotid artery disease may also be a potential possibility, however patient continues to refuse invasive/surgical intervention. F/u dopplers pending.   2. Left Renal Infarction: occurred in the setting of Xarelto being held for anticipated spinal injection. With chronic afib. Xarelto restarted. Also on ASA, 81 mg. Will be on renal dose until his Cr improves we will try to get samples from office  3. Bilateral Carotid Artery Disease: f/u dopplers ordered. He has been seen in the  past by Dr. Oneida Alar. Per records, he has refused to consider any potential carotid intervention or surgery.We rediscussed this, and he remains adamant that he wants to continue conservative management. Continue Xartelto, ASA and Crestor.   4. CAD: s/p CABG in 2003. Stable w/o anginal symptomatology. Cardiac enzymes negative x 3. 2D echo with normal wall motion and normal EF. Continue medical therapy for secondary prevention: ASA, BB and statin.   5. Permanent Atrial Fibrillation: rate controlled with Coreg. Asymptomatic. Continue Xarelto for stroke prophylaxis.   Will arrange follow up appt in office and will check labs. BMP   Time spent with pt. :15 minutes. Cecilie Kicks  Nurse Practitioner Certified Pager [phone] or after 5pm and on weekends call [phone] 08/02/2015, 8:42 AM   I have seen, examined and evaluated the patient this AM on rounds along with Ms. Ingold,NP.  After reviewing all the available data and chart, we discussed the patients laboratory, study & physical findings as well as symptoms in detail. I agree with her findings, examination as well as impression recommendations as per our discussion.   An incidental episode was clearly related to extreme pain from renal infarct. -- Likely vasovagal syncope. No further syncope. In stable A. fib. Rate well controlled on carvedilol.  Reinitiating antibiotic laced with Xarelto. Based on his infarct related renal insufficiency, we have reduced to 50 mg until his kidney function can really reassess. In order to avoid the need for a new prescription, we have arranged for his family members to pick up a milligram Xarelto samples from our [place] office. This should hopefully last until his renal function normalizes and we can determine whether or not he needs to be on 15 or 20 mg tablets.  Otherwise stable from a overall cardiac pressure standpoint.  We talked about the importance of adequate hydration.  He is stable for discharge from a  cardiac standpoint. I discussed plans with the primary service.  We will arrange follow-up with Dr. Hardin Negus, M.D., M.S. Interventional Cardiologist   Pager # [phone] Phone # [phone] [address] [place], [place] [address]

## 2015-08-02 NOTE — Telephone Encounter (Signed)
Spoke with Mickel Baas I PA and she asked that Xarelto 15 mg one daily be put at the for patient

## 2015-08-02 NOTE — Discharge Summary (Signed)
Physician Discharge Summary  Gregory [name] [id number] DOB: 1932/11/27 DOA: 07/30/2015  PCP: Lamar Blinks, MD  Admit date: 07/30/2015 Discharge date: 08/02/2015   Recommendations for Outpatient Follow-Up:   1.    Discharge Diagnosis:   Principal Problem:    Syncope and collapse with facial trauma Active Problems:    Chronic a-fib (HCC)    Anticoagulated on Xarelto    Carotid stenosis    Renal infarction [facility])    Vasovagal syncope    Coronary artery disease involving native coronary artery   Discharge disposition:  Home.    Discharge Condition: Improved.  Diet recommendation: Low sodium, heart healthy.    History of Present Illness:   Gregory [name] is an 82 y.o. 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 He had left lower quadrant abdominal pain prior to the syncopal event. Workup in the ED revealed evidence of a left renal infarction.  Hospital Course by Problem:   Principal Problem:  Syncope with fall resulting in facial trauma Likely a vasovagal event induced by acute severe abdominal pain. Continue to monitor on telemetry. 2-D echo showed an EF of 55-60 % and was consistent with his prior echo with no changes. CT of the head, maxillofacial area and cervical spine were negative for fractures. PT evaluation performed with no PT follow-up recommended. Cardiology consulted per family request. They suggested possible carotid artery disease may be a potential etiology, but notes that he has refused invasive/surgical intervention. No right heart strain to suggest acute pulmonary embolism, and the patient is chronically anticoagulated making this unlikely.  Active Problems:  Bilateral Carotid Artery Disease He has been seen in  the past by Dr. Oneida Alar. Per records, he has refused to consider any potential carotid intervention or surgery.   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   Chronic a-fib (HCC)/ Anticoagulated on Xarelto Rate well controlled. Continue Xarelto.   Renal infarction ([place]) Appears to be related to interrupting anticoagulation x 3 days for a procedure. Pain largely resolved at discharge.   Medical Consultants:    Cardiology   Discharge Exam:   Filed Vitals:   08/02/15 0500 08/02/15 0813  BP: 130/57 122/53  Pulse: 69 71  Temp: 99.6 F (37.6 C)   Resp: 16    Filed Vitals:   08/01/15 1407 08/01/15 2234 08/02/15 0500 08/02/15 0813  BP: 111/49 124/59 130/57 122/53  Pulse: 90 76 69 71  Temp: 99.2 F (37.3 C) 98 F (36.7 C) 99.6 F (37.6 C)   TempSrc: Oral Oral Oral   Resp:  16 16   Height:      Weight:   86.637 kg (191 lb)   SpO2: 94% 94% 94%     Gen:  NAD Cardiovascular:  HSIR, No M/R/G Respiratory: Lungs CTAB Gastrointestinal: Abdomen soft, NT/ND with normal active bowel sounds. Extremities: No C/E/C   The results of significant diagnostics from this hospitalization (including imaging, microbiology, ancillary and laboratory) are listed below for reference.     Procedures and Diagnostic Studies:   Dg Chest 2 View  07/30/2015  CLINICAL DATA:  Syncope with fall EXAM: CHEST  2 VIEW COMPARISON:  February 09, 2014 FINDINGS: Lungs are clear. Heart is enlarged with pulmonary vascularity within normal limits.  Patient is status post coronary artery bypass grafting. No adenopathy. No bone lesions. There is degenerative change in the lower thoracic and upper lumbar spine regions. IMPRESSION: Cardiomegaly.  No edema or consolidation. Electronically Signed   By: Lowella Grip III M.D.   On: 07/30/2015 13:53   Dg Elbow Complete Left  07/30/2015  CLINICAL DATA:  [place] today.  Syncope.  Skin tear on the  elbow. EXAM: LEFT ELBOW - COMPLETE 3+ VIEW COMPARISON:  None. FINDINGS: Negative for a fracture or dislocation. Mild spurring and degenerative changes in the proximal ulna. Mild spurring or enthesopathic changes involving the lateral humeral epicondyle. Irregularity of the soft tissue along the lateral aspect of the elbow. IMPRESSION: No acute bone abnormality to the left elbow. Electronically Signed   By: Markus Daft M.D.   On: 07/30/2015 13:59   Dg Wrist Complete Right  07/30/2015  CLINICAL DATA:  Right wrist pain, fell today onto cement EXAM: RIGHT WRIST - COMPLETE 3+ VIEW COMPARISON:  None. FINDINGS: Four views of the right wrist submitted. No acute fracture or subluxation. There is degenerative narrowing of radiocarpal joint space. Atherosclerotic vascular calcifications are noted. Erosive degenerative changes are noted triquetrum. IMPRESSION: No acute fracture or subluxation. Degenerative changes as described above. Electronically Signed   By: Lahoma Crocker M.D.   On: 07/30/2015 14:08   Ct Head Wo Contrast  07/30/2015  CLINICAL DATA:  Syncope and fall.  Facial laceration. EXAM: CT HEAD WITHOUT CONTRAST CT MAXILLOFACIAL WITHOUT CONTRAST CT CERVICAL SPINE WITHOUT CONTRAST TECHNIQUE: Multidetector CT imaging of the head, cervical spine, and maxillofacial structures were performed using the standard protocol without intravenous contrast. Multiplanar CT image reconstructions of the cervical spine and maxillofacial structures were also generated. COMPARISON:  CT scan of February 19, 2004. FINDINGS: CT HEAD FINDINGS Bony calvarium appears intact. Mild chronic ischemic white matter disease is noted. Old right basal ganglia infarction is noted and stable. No mass effect or midline shift is noted. Ventricular size is within normal limits. There is no evidence of mass lesion, hemorrhage or acute infarction. CT MAXILLOFACIAL FINDINGS Globes and orbits appear normal. Paranasal sinuses appear normal. No fracture or other  bony abnormality is noted. Pterygoid plates appear normal. CT CERVICAL SPINE FINDINGS No fracture is noted. Minimal grade 1 anterolisthesis of C4-5 is noted secondary to posterior facet joint hypertrophy. Disc spaces are well-maintained. Visualized lung apices appear intact. IMPRESSION: Mild chronic ischemic white matter disease. Old right basal ganglia infarction. No acute intracranial abnormality seen. No abnormality seen in the maxillofacial region. Minimal grade 1 anterolisthesis of C4-5 is noted secondary to posterior facet joint hypertrophy. No acute abnormality seen in the cervical spine. Electronically Signed   By: Marijo Conception, M.D.   On: 07/30/2015 13:50   Ct Cervical Spine Wo Contrast  07/30/2015  CLINICAL DATA:  Syncope and fall.  Facial laceration. EXAM: CT HEAD WITHOUT CONTRAST CT MAXILLOFACIAL WITHOUT CONTRAST CT CERVICAL SPINE WITHOUT CONTRAST TECHNIQUE: Multidetector CT imaging of the head, cervical spine, and maxillofacial structures were performed using the standard protocol without intravenous contrast. Multiplanar CT image reconstructions of the cervical spine and maxillofacial structures were also generated. COMPARISON:  CT scan of February 19, 2004. FINDINGS: CT HEAD FINDINGS Bony calvarium appears intact. Mild chronic ischemic white matter disease is noted. Old right basal ganglia infarction is noted and stable. No mass effect or midline shift is noted. Ventricular size is within normal limits. There is no evidence of mass lesion, hemorrhage or acute infarction. CT MAXILLOFACIAL FINDINGS Globes  and orbits appear normal. Paranasal sinuses appear normal. No fracture or other bony abnormality is noted. Pterygoid plates appear normal. CT CERVICAL SPINE FINDINGS No fracture is noted. Minimal grade 1 anterolisthesis of C4-5 is noted secondary to posterior facet joint hypertrophy. Disc spaces are well-maintained. Visualized lung apices appear intact. IMPRESSION: Mild chronic ischemic white  matter disease. Old right basal ganglia infarction. No acute intracranial abnormality seen. No abnormality seen in the maxillofacial region. Minimal grade 1 anterolisthesis of C4-5 is noted secondary to posterior facet joint hypertrophy. No acute abnormality seen in the cervical spine. Electronically Signed   By: Marijo Conception, M.D.   On: 07/30/2015 13:50   Ct Abdomen Pelvis W Contrast  07/30/2015  CLINICAL DATA:  Left lower quadrant pain for 1 day EXAM: CT ABDOMEN AND PELVIS WITH CONTRAST TECHNIQUE: Multidetector CT imaging of the abdomen and pelvis was performed using the standard protocol following bolus administration of intravenous contrast. CONTRAST:  102mL ISOVUE-300 IOPAMIDOL (ISOVUE-300) INJECTION 61% COMPARISON:  01/20/2013 FINDINGS: Lung bases are free of acute infiltrate or sizable effusion. The liver, gallbladder, adrenal glands and pancreas are within normal limits. The spleen demonstrates significant scarring consistent with the history of prior splenic infarction. The right kidney is well visualized and again demonstrates a renal cyst. No obstructive changes are seen. The left kidney demonstrates mottled enhancement consistent with renal infarction. On the coronal reconstructions on image number 91 of series 5 there is a filling defect identified in the distal left renal artery. Aortoiliac calcifications are noted without aneurysmal dilatation. The appendix has been surgically removed. Scattered diverticular change is noted without evidence of diverticulitis. The bladder is well distended. A fat containing left inguinal hernia is noted. No pelvic mass lesion is seen. The osseous structures show degenerative changes of the lumbar spine. 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 Scarring in the spleen consistent with prior splenic infarct. Chronic changes without acute abnormality. Critical Value/emergent results were called by  telephone at the time of interpretation on 07/30/2015 at 3:39 pm to [facility] , who verbally acknowledged these results. Electronically Signed   By: Inez Catalina M.D.   On: 07/30/2015 15:42   Ct Maxillofacial Wo Cm  07/30/2015  CLINICAL DATA:  Syncope and fall.  Facial laceration. EXAM: CT HEAD WITHOUT CONTRAST CT MAXILLOFACIAL WITHOUT CONTRAST CT CERVICAL SPINE WITHOUT CONTRAST TECHNIQUE: Multidetector CT imaging of the head, cervical spine, and maxillofacial structures were performed using the standard protocol without intravenous contrast. Multiplanar CT image reconstructions of the cervical spine and maxillofacial structures were also generated. COMPARISON:  CT scan of February 19, 2004. FINDINGS: CT HEAD FINDINGS Bony calvarium appears intact. Mild chronic ischemic white matter disease is noted. Old right basal ganglia infarction is noted and stable. No mass effect or midline shift is noted. Ventricular size is within normal limits. There is no evidence of mass lesion, hemorrhage or acute infarction. CT MAXILLOFACIAL FINDINGS Globes and orbits appear normal. Paranasal sinuses appear normal. No fracture or other bony abnormality is noted. Pterygoid plates appear normal. CT CERVICAL SPINE FINDINGS No fracture is noted. Minimal grade 1 anterolisthesis of C4-5 is noted secondary to posterior facet joint hypertrophy. Disc spaces are well-maintained. Visualized lung apices appear intact. IMPRESSION: Mild chronic ischemic white matter disease. Old right basal ganglia infarction. No acute intracranial abnormality seen. No abnormality seen in the maxillofacial region. Minimal grade 1 anterolisthesis of C4-5 is noted secondary to posterior facet joint hypertrophy. No acute abnormality  seen in the cervical spine. Electronically Signed   By: Marijo Conception, M.D.   On: 07/30/2015 13:50     Labs:   Basic Metabolic Panel:  Recent Labs Lab 07/30/15 1400 07/31/15 0838  NA 136 137  K 4.0 4.1  CL 99* 99*    CO2 27 26  GLUCOSE 107* 118*  BUN 23* 23*  CREATININE 1.83* 1.85*  CALCIUM 8.9 8.7*   GFR Estimated Creatinine Clearance: 32.3 mL/min (by C-G formula based on Cr of 1.85). Liver Function Tests: No results for input(s): AST, ALT, ALKPHOS, BILITOT, PROT, ALBUMIN in the last 168 hours. No results for input(s): LIPASE, AMYLASE in the last 168 hours. No results for input(s): AMMONIA in the last 168 hours. Coagulation profile No results for input(s): INR, PROTIME in the last 168 hours.  CBC:  Recent Labs Lab 07/30/15 1400  WBC 12.5*  NEUTROABS 8.8*  HGB 13.0  HCT 39.5  MCV 86.1  PLT 214   Cardiac Enzymes:  Recent Labs Lab 07/30/15 1836 07/31/15 0141 07/31/15 0838  TROPONINI <0.03 <0.03 <0.03     Discharge Instructions:   Discharge Instructions    Call MD for:  extreme fatigue    Complete by:  As directed      Call MD for:  persistant nausea and vomiting    Complete by:  As directed      Call MD for:  severe uncontrolled pain    Complete by:  As directed      Diet - low sodium heart healthy    Complete by:  As directed      Increase activity slowly    Complete by:  As directed             Medication List    TAKE these medications        acetaminophen 500 MG tablet  Commonly known as:  TYLENOL  Take 500 mg by mouth every 6 (six) hours as needed for mild pain.     aspirin EC 81 MG tablet  Take 81 mg by mouth daily.     carvedilol 3.125 MG tablet  Commonly known as:  COREG  TAKE 3 TABLETS BY MOUTH TWICE DAILY WITH MEALS     feeding supplement (ENSURE ENLIVE) Liqd  Take 237 mLs by mouth 2 (two) times daily between meals.     furosemide 40 MG tablet  Commonly known as:  LASIX  Take 0.5 tablets (20 mg total) by mouth daily.     HYDROcodone-acetaminophen 5-325 MG tablet  Commonly known as:  NORCO/VICODIN  Take 1 tablet by mouth every 4 (four) hours as needed for moderate pain or severe pain.     lisinopril 10 MG tablet  Commonly known as:   PRINIVIL,ZESTRIL  TAKE 1 AND 1/2 TABLETS(15 MG) BY MOUTH DAILY     pantoprazole 40 MG tablet  Commonly known as:  PROTONIX  TAKE 1 TABLET BY MOUTH EVERY DAY     Rivaroxaban 15 MG Tabs tablet  Commonly known as:  XARELTO  Take 1 tablet (15 mg total) by mouth daily with supper.     rosuvastatin 40 MG tablet  Commonly known as:  CRESTOR  Take 1 tablet (40 mg total) by mouth daily.     traMADol 50 MG tablet  Commonly known as:  ULTRAM  Take a 1/2 tablet every 8 hours as needed for pain           Follow-up Information    Follow up with [facility]  Deanne Coffer, PA-C On 08/09/2015.   Specialties:  Cardiology, Radiology   Why:  Dr. Evette Georges PA but note address in different office.   Contact information:   [address] [place] [address] [phone]        Time coordinating discharge: 35 minutes.  Signed:  Lashonda Sonneborn  Pager [phone] [facility] 08/02/2015, 4:47 PM

## 2015-08-05 NOTE — Telephone Encounter (Signed)
Left message to call office for TCM CALL

## 2015-08-05 NOTE — Telephone Encounter (Signed)
Patient contacted regarding discharge from [facility] on 5/19 Patient understands to follow up with provider Melina Copa on 5/26 at 0830 at [facility]. Patient understands discharge instructions? Yes Patient understands medications and regiment? Yes Patient understands to bring all medications to this visit? Yes

## 2015-08-05 NOTE — Telephone Encounter (Signed)
Follow Up ° °Pt returned call//  °

## 2015-08-06 ENCOUNTER — Encounter: Payer: Self-pay | Admitting: Physician Assistant

## 2015-08-06 DIAGNOSIS — N183 Chronic kidney disease, stage 3 unspecified: Secondary | ICD-10-CM | POA: Insufficient documentation

## 2015-08-09 ENCOUNTER — Emergency Department (HOSPITAL_COMMUNITY)
Admission: EM | Admit: 2015-08-09 | Discharge: 2015-08-10 | Disposition: A | Discharge status: Home / Self Care | Payer: Medicare Other | Attending: Emergency Medicine | Admitting: Emergency Medicine

## 2015-08-09 ENCOUNTER — Emergency Department (HOSPITAL_COMMUNITY): Payer: Medicare Other

## 2015-08-09 ENCOUNTER — Ambulatory Visit (INDEPENDENT_AMBULATORY_CARE_PROVIDER_SITE_OTHER): Payer: Medicare Other | Admitting: Physician Assistant

## 2015-08-09 ENCOUNTER — Encounter (HOSPITAL_COMMUNITY): Payer: Self-pay | Admitting: *Deleted

## 2015-08-09 ENCOUNTER — Encounter: Payer: Self-pay | Admitting: Physician Assistant

## 2015-08-09 VITALS — BP 115/62 | HR 67 | Ht 66.0 in | Wt 193.8 lb

## 2015-08-09 DIAGNOSIS — R109 Unspecified abdominal pain: Secondary | ICD-10-CM

## 2015-08-09 DIAGNOSIS — I10 Essential (primary) hypertension: Secondary | ICD-10-CM | POA: Insufficient documentation

## 2015-08-09 DIAGNOSIS — I2510 Atherosclerotic heart disease of native coronary artery without angina pectoris: Secondary | ICD-10-CM

## 2015-08-09 DIAGNOSIS — Z9861 Coronary angioplasty status: Secondary | ICD-10-CM | POA: Insufficient documentation

## 2015-08-09 DIAGNOSIS — Z951 Presence of aortocoronary bypass graft: Secondary | ICD-10-CM | POA: Diagnosis not present

## 2015-08-09 DIAGNOSIS — K219 Gastro-esophageal reflux disease without esophagitis: Secondary | ICD-10-CM | POA: Diagnosis not present

## 2015-08-09 DIAGNOSIS — Z87891 Personal history of nicotine dependence: Secondary | ICD-10-CM | POA: Diagnosis not present

## 2015-08-09 DIAGNOSIS — I482 Chronic atrial fibrillation, unspecified: Secondary | ICD-10-CM

## 2015-08-09 DIAGNOSIS — N280 Ischemia and infarction of kidney: Secondary | ICD-10-CM | POA: Diagnosis not present

## 2015-08-09 DIAGNOSIS — I5022 Chronic systolic (congestive) heart failure: Secondary | ICD-10-CM | POA: Diagnosis not present

## 2015-08-09 DIAGNOSIS — Z87442 Personal history of urinary calculi: Secondary | ICD-10-CM | POA: Diagnosis not present

## 2015-08-09 DIAGNOSIS — I25810 Atherosclerosis of coronary artery bypass graft(s) without angina pectoris: Secondary | ICD-10-CM | POA: Diagnosis not present

## 2015-08-09 DIAGNOSIS — R1084 Generalized abdominal pain: Secondary | ICD-10-CM | POA: Diagnosis not present

## 2015-08-09 DIAGNOSIS — E785 Hyperlipidemia, unspecified: Secondary | ICD-10-CM | POA: Diagnosis not present

## 2015-08-09 DIAGNOSIS — R710 Precipitous drop in hematocrit: Secondary | ICD-10-CM | POA: Insufficient documentation

## 2015-08-09 DIAGNOSIS — R799 Abnormal finding of blood chemistry, unspecified: Secondary | ICD-10-CM | POA: Diagnosis present

## 2015-08-09 DIAGNOSIS — M1990 Unspecified osteoarthritis, unspecified site: Secondary | ICD-10-CM | POA: Diagnosis not present

## 2015-08-09 DIAGNOSIS — Z8673 Personal history of transient ischemic attack (TIA), and cerebral infarction without residual deficits: Secondary | ICD-10-CM | POA: Insufficient documentation

## 2015-08-09 DIAGNOSIS — I739 Peripheral vascular disease, unspecified: Secondary | ICD-10-CM

## 2015-08-09 DIAGNOSIS — Z79899 Other long term (current) drug therapy: Secondary | ICD-10-CM | POA: Insufficient documentation

## 2015-08-09 DIAGNOSIS — I252 Old myocardial infarction: Secondary | ICD-10-CM | POA: Diagnosis not present

## 2015-08-09 DIAGNOSIS — R011 Cardiac murmur, unspecified: Secondary | ICD-10-CM | POA: Diagnosis not present

## 2015-08-09 DIAGNOSIS — R55 Syncope and collapse: Secondary | ICD-10-CM

## 2015-08-09 DIAGNOSIS — N289 Disorder of kidney and ureter, unspecified: Secondary | ICD-10-CM | POA: Diagnosis not present

## 2015-08-09 DIAGNOSIS — Z7982 Long term (current) use of aspirin: Secondary | ICD-10-CM | POA: Diagnosis not present

## 2015-08-09 DIAGNOSIS — M549 Dorsalgia, unspecified: Secondary | ICD-10-CM | POA: Diagnosis not present

## 2015-08-09 DIAGNOSIS — I779 Disorder of arteries and arterioles, unspecified: Secondary | ICD-10-CM

## 2015-08-09 DIAGNOSIS — N200 Calculus of kidney: Secondary | ICD-10-CM | POA: Diagnosis not present

## 2015-08-09 LAB — PROTIME-INR
INR: 1.66 — ABNORMAL HIGH (ref 0.00–1.49)
Prothrombin Time: 19.6 seconds — ABNORMAL HIGH (ref 11.6–15.2)

## 2015-08-09 LAB — URINE MICROSCOPIC-ADD ON: WBC, UA: NONE SEEN WBC/hpf (ref 0–5)

## 2015-08-09 LAB — BASIC METABOLIC PANEL
BUN: 34 mg/dL — AB (ref 7–25)
CHLORIDE: 99 mmol/L (ref 98–110)
CO2: 27 mmol/L (ref 20–31)
CREATININE: 1.98 mg/dL — AB (ref 0.70–1.11)
Calcium: 8.4 mg/dL — ABNORMAL LOW (ref 8.6–10.3)
GLUCOSE: 100 mg/dL — AB (ref 65–99)
Potassium: 4.2 mmol/L (ref 3.5–5.3)
SODIUM: 138 mmol/L (ref 135–146)

## 2015-08-09 LAB — CBC WITH DIFFERENTIAL/PLATELET
BASOS ABS: 0 {cells}/uL (ref 0–200)
Basophils Relative: 0 %
EOS ABS: 172 {cells}/uL (ref 15–500)
Eosinophils Relative: 2 %
HCT: 31.7 % — ABNORMAL LOW (ref 38.5–50.0)
Hemoglobin: 10.6 g/dL — ABNORMAL LOW (ref 13.2–17.1)
LYMPHS PCT: 29 %
Lymphs Abs: 2494 cells/uL (ref 850–3900)
MCH: 28.6 pg (ref 27.0–33.0)
MCHC: 33.4 g/dL (ref 32.0–36.0)
MCV: 85.7 fL (ref 80.0–100.0)
MONOS PCT: 12 %
MPV: 9.3 fL (ref 7.5–12.5)
Monocytes Absolute: 1032 cells/uL — ABNORMAL HIGH (ref 200–950)
NEUTROS PCT: 57 %
Neutro Abs: 4902 cells/uL (ref 1500–7800)
PLATELETS: 336 10*3/uL (ref 140–400)
RBC: 3.7 MIL/uL — ABNORMAL LOW (ref 4.20–5.80)
RDW: 15.7 % — AB (ref 11.0–15.0)
WBC: 8.6 10*3/uL (ref 3.8–10.8)

## 2015-08-09 LAB — COMPREHENSIVE METABOLIC PANEL
ALT: 35 U/L (ref 17–63)
AST: 28 U/L (ref 15–41)
Albumin: 2.7 g/dL — ABNORMAL LOW (ref 3.5–5.0)
Alkaline Phosphatase: 101 U/L (ref 38–126)
Anion gap: 9 (ref 5–15)
BUN: 37 mg/dL — ABNORMAL HIGH (ref 6–20)
CHLORIDE: 102 mmol/L (ref 101–111)
CO2: 28 mmol/L (ref 22–32)
CREATININE: 2.16 mg/dL — AB (ref 0.61–1.24)
Calcium: 8.4 mg/dL — ABNORMAL LOW (ref 8.9–10.3)
GFR, EST AFRICAN AMERICAN: 31 mL/min — AB (ref >60)
GFR, EST NON AFRICAN AMERICAN: 27 mL/min — AB (ref >60)
Glucose, Bld: 109 mg/dL — ABNORMAL HIGH (ref 65–99)
Potassium: 3.7 mmol/L (ref 3.5–5.1)
Sodium: 139 mmol/L (ref 135–145)
Total Bilirubin: 0.3 mg/dL (ref 0.3–1.2)
Total Protein: 7.1 g/dL (ref 6.5–8.1)

## 2015-08-09 LAB — URINALYSIS, ROUTINE W REFLEX MICROSCOPIC
Bilirubin Urine: NEGATIVE
GLUCOSE, UA: NEGATIVE mg/dL
Ketones, ur: NEGATIVE mg/dL
LEUKOCYTES UA: NEGATIVE
Nitrite: NEGATIVE
PH: 5.5 (ref 5.0–8.0)
Protein, ur: 30 mg/dL — AB
SPECIFIC GRAVITY, URINE: 1.023 (ref 1.005–1.030)

## 2015-08-09 LAB — CBC
HCT: 31.6 % — ABNORMAL LOW (ref 39.0–52.0)
Hemoglobin: 9.8 g/dL — ABNORMAL LOW (ref 13.0–17.0)
MCH: 27.2 pg (ref 26.0–34.0)
MCHC: 31 g/dL (ref 30.0–36.0)
MCV: 87.8 fL (ref 78.0–100.0)
PLATELETS: 329 10*3/uL (ref 150–400)
RBC: 3.6 MIL/uL — AB (ref 4.22–5.81)
RDW: 15.4 % (ref 11.5–15.5)
WBC: 7.2 10*3/uL (ref 4.0–10.5)

## 2015-08-09 LAB — POC OCCULT BLOOD, ED: FECAL OCCULT BLD: NEGATIVE

## 2015-08-09 LAB — APTT: APTT: 55 s — AB (ref 24–37)

## 2015-08-09 LAB — TYPE AND SCREEN
ABO/RH(D): O NEG
Antibody Screen: NEGATIVE

## 2015-08-09 LAB — ABO/RH: ABO/RH(D): O NEG

## 2015-08-09 MED ORDER — TRAMADOL HCL 50 MG PO TABS: 50.0000 mg | ORAL_TABLET | Freq: Once | ORAL | Status: DC

## 2015-08-09 NOTE — ED Provider Notes (Signed)
CSN: [id number]     Arrival date & time 08/09/15  1754 History   First MD Initiated Contact with Patient 08/09/15 2025     Chief Complaint  Patient presents with  . Abnormal Lab     (Consider location/radiation/quality/duration/timing/severity/associated sxs/prior Treatment) HPI   Patient  With PMH CAD, CHF, heart murmur, a. Fib, hypertension, hyperlipidemia, MI, GERD, stroke, renal infarct, splenic infarct, Sick sinus syndrome.  The was recently discharged from the hospital after sustaining a renal infarct with AKI to the kidney after taking 3 days off Xarelto for pain management injection to the back. The patient went to follow-up with cardiology today as a dc from inpatient visit and had his labs redrawn, his hemoglobin has dropped 3 units since discharge on 5/19. He has not noticed any change in the color of his stool, blood in his urine, bleeding from any wounds.  The patient endorses recently having more back pain when he wakes up in the morning. The cardiology office sent him to the ER to find or r/o bleed.  He denies headache, chest pain, shortness of breath, lower chimney swelling, rash, fatigue, fever, confusion, myalgias or arthralgias  Past Medical History  Diagnosis Date  . CAD (coronary artery disease),CABG 1993-LIMA to the LAD, SVG to Om and SVG to PDA/PLA, patent on cath 2008. 2003    a. s/p CABG 2003. b. last cath 2008 with patent grafts.  . Chronic systolic CHF (congestive heart failure) ([place])     a. Prior low EF, later normalized.  Marland Kitchen Heart murmur   . Chronic atrial fibrillation (HCC)     permanent  . Essential hypertension   . Hyperlipidemia   . Heart attack ([place]) 1980's  . GERD (gastroesophageal reflux disease)   . Stroke [facility]) 2007    "slightly drags left foot since; recovered qthing else" (01/21/2013)  . Kidney stones     "passed all but one time when he had to have lithotripsy" (01/21/2013)  . Ischemic cardiomyopathy   . 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 SSS (sick  sinus syndrome) ([place])   . Pulmonary hypertension ([place])   . Arthritis   . Syncope 07/2015    a. felt vasovagal in setting of pain from renal infarct.  Marland Kitchen Appendicitis with abscess 03/31/2011  . 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 07/2015 - after holding Xarelto x 3 days for spinal procedure.  . Carotid artery disease ([place])     a. Severe - pt refused intervention in the past.   Past Surgical History  Procedure Laterality Date  . Laparoscopic appendectomy  03/30/2011    Procedure: APPENDECTOMY LAPAROSCOPIC;  Surgeon: Joyice Faster. Cornett, MD;  Location: [place] OR;  Service: General;  Laterality: N/A;  . Cataract extraction w/ intraocular lens implant Bilateral 2013  . Esophagogastroduodenoscopy  02/01/2012    Procedure: ESOPHAGOGASTRODUODENOSCOPY (EGD);  Surgeon: Beryle Beams, MD;  Location: [facility];  Service: Endoscopy;  Laterality: N/A;  . Coronary artery bypass graft  03/01/2002    LIMA to LAD, reverse SVG to OM, reverse SVG to PDA of RCA, reverse SVG to PLA of RCA, ligation of LA appendage (Dr. Servando Snare)  . Lithotripsy      "once" (01/21/2013)  . Tee without cardioversion N/A 01/23/2013    Procedure: TRANSESOPHAGEAL ECHOCARDIOGRAM (TEE);  Surgeon: Sanda Klein, MD;  Location: [facility] ENDOSCOPY;  Service: Cardiovascular;  Laterality: N/A;  . Transthoracic echocardiogram  06/23/2012    EF 40-45%, mild LVH; mild AV regurg; mild MV regurg; LV mod-severely dilated; RV  mildly dilated; systolic pressure borderline increased; RA mod dilated  . Nm myocar perf wall motion  05/2009    persantine myoview - fixed moderate perfusion defect in inferior wall & lateral segment of apex (poor non-transmural infarction), minimal anterolateral periinfarct reversible ischemia seen, abnormal study, defects similar to 2006 study  . Coronary angioplasty  07/05/2006    3 vessel CAD, patent LIMA to LAD, patentVG to OM, patent SVG to PDA & PLA, mild MR, severe LV systolic dysfunction (Dr. Gerrie Nordmann)  . Cardiac catheterization   02/24/2002    reduced LV function, 60-70% prox RCA stenosis, 70% PLA ostial stenosis, 80% secondary branch of PLA stenosis - subsequent CABG (Dr. Jackie Plum)  . Carotid doppler  06/2012    50-69% right bulb/prox ICA diameter reduction; 70-99% left bulb/prox ICA diameter reduction  . Splenectomy, total  01/2013  . Left heart catheterization with coronary angiogram N/A 01/24/2013    Procedure: LEFT HEART CATHETERIZATION WITH CORONARY ANGIOGRAM;  Surgeon: [name], MD;  Location: [facility] CATH LAB;  Service: Cardiovascular;  Laterality: N/A;  Right heart with grafts  . Esophagogastroduodenoscopy N/A 05/22/2015    Procedure: ESOPHAGOGASTRODUODENOSCOPY (EGD);  Surgeon: Gatha Mayer, MD;  Location: [facility];  Service: Endoscopy;  Laterality: N/A;  . Balloon dilation N/A 05/22/2015    Procedure: BALLOON DILATION;  Surgeon: Gatha Mayer, MD;  Location: [facility] ENDOSCOPY;  Service: Endoscopy;  Laterality: N/A;   Family History  Problem Relation Age of Onset  . Heart disease Father     rheumatic fever  . Heart attack Brother 38  . Stroke Mother   . Stroke Brother 110   Social History  Substance Use Topics  . Smoking status: Former Smoker -- 2.00 packs/day for 20 years    Types: Cigarettes    Quit date: 04/08/1986  . Smokeless tobacco: Current User    Types: Chew  . Alcohol Use: No    Review of Systems  Review of Systems All other systems negative except as documented in the HPI. All pertinent positives and negatives as reviewed in the HPI.   Allergies  Percocet  Home Medications   Prior to Admission medications   Medication Sig Start Date End Date Taking? Authorizing Provider  aspirin EC 81 MG tablet Take 81 mg by mouth daily.   Yes Historical Provider, MD  carvedilol (COREG) 3.125 MG tablet TAKE 3 TABLETS BY MOUTH TWICE DAILY WITH MEALS 07/29/15  Yes Troy Sine, MD  Cholecalciferol (VITAMIN D-3 PO) Take 1 capsule by mouth daily.   Yes Historical Provider, MD  [facility] Liver Oil OIL  Take 1 capsule by mouth daily.   Yes Historical Provider, MD  furosemide (LASIX) 40 MG tablet Take 0.5 tablets (20 mg total) by mouth daily. 08/20/14  Yes Troy Sine, MD  HYDROcodone-acetaminophen (NORCO/VICODIN) 5-325 MG tablet Take 1 tablet by mouth every 4 (four) hours as needed for moderate pain or severe pain. 08/02/15  Yes Christina P Rama, MD  lisinopril (PRINIVIL,ZESTRIL) 10 MG tablet TAKE 1 AND 1/2 TABLETS(15 MG) BY MOUTH DAILY Patient taking differently: Take 15 mg by mouth nightly 06/26/15  Yes Troy Sine, MD  Multiple Vitamins-Minerals (HAIR SKIN NAILS PO) Take 2 tablets by mouth daily.   Yes Historical Provider, MD  pantoprazole (PROTONIX) 40 MG tablet TAKE 1 TABLET BY MOUTH EVERY DAY 12/03/14  Yes Troy Sine, MD  Rivaroxaban (XARELTO) 15 MG TABS tablet Take 1 tablet (15 mg total) by mouth daily with supper. 08/02/15  Yes  Venetia Maxon Rama, MD  rosuvastatin (CRESTOR) 40 MG tablet Take 1 tablet (40 mg total) by mouth daily. 03/06/15  Yes Troy Sine, MD  feeding supplement, ENSURE ENLIVE, (ENSURE ENLIVE) LIQD Take 237 mLs by mouth 2 (two) times daily between meals. 08/02/15   Venetia Maxon Rama, MD  traMADol (ULTRAM) 50 MG tablet Take 25 mg by mouth every 6 (six) hours as needed for moderate pain (back pain).    Historical Provider, MD   BP 131/59 mmHg  Pulse 75  Temp(Src) 98.2 F (36.8 C) (Oral)  Resp 21  SpO2 97% Physical Exam  Constitutional: He appears well-developed and well-nourished. No distress.  HENT:  Head: Normocephalic and atraumatic.  Nose: Nose normal.  Mouth/Throat: Uvula is midline, oropharynx is clear and moist and mucous membranes are normal.  Eyes: Pupils are equal, round, and reactive to light.  Neck: Normal range of motion. Neck supple.  Cardiovascular: Normal rate and regular rhythm.   Pulmonary/Chest: Effort normal.  Abdominal: Soft. There is tenderness (very mild diffuse). There is no rigidity, no rebound and no guarding.  No signs of abdominal  distention  Genitourinary: Guaiac negative stool.  Musculoskeletal:  No LE swelling  Neurological: He is alert.  Acting at baseline  Skin: Skin is warm and dry. No purpura and no rash noted. He is not diaphoretic. No erythema. No pallor.  Wound to left elbow, old - not actively bleeding  Nursing note and vitals reviewed.   ED Course  Procedures (including critical care time) Labs Review Labs Reviewed  COMPREHENSIVE METABOLIC PANEL - Abnormal; Notable for the following:    Glucose, Bld 109 (*)    BUN 37 (*)    Creatinine, Ser 2.16 (*)    Calcium 8.4 (*)    Albumin 2.7 (*)    GFR calc non Af Amer 27 (*)    GFR calc Af Amer 31 (*)    All other components within normal limits  CBC - Abnormal; Notable for the following:    RBC 3.60 (*)    Hemoglobin 9.8 (*)    HCT 31.6 (*)    All other components within normal limits  PROTIME-INR - Abnormal; Notable for the following:    Prothrombin Time 19.6 (*)    INR 1.66 (*)    All other components within normal limits  APTT - Abnormal; Notable for the following:    aPTT 55 (*)    All other components within normal limits  URINALYSIS, ROUTINE W REFLEX MICROSCOPIC (NOT AT [facility]) - Abnormal; Notable for the following:    APPearance CLOUDY (*)    Hgb urine dipstick SMALL (*)    Protein, ur 30 (*)    All other components within normal limits  URINE MICROSCOPIC-ADD ON - Abnormal; Notable for the following:    Squamous Epithelial / LPF 0-5 (*)    Bacteria, UA RARE (*)    Casts GRANULAR CAST (*)    All other components within normal limits  OCCULT BLOOD X 1 CARD TO LAB, STOOL  POC OCCULT BLOOD, ED  TYPE AND SCREEN  ABO/RH    Imaging Review Dg Chest 2 View  08/09/2015  CLINICAL DATA:  Recent renal embolization and back pain EXAM: CHEST  2 VIEW COMPARISON:  07/30/2015 FINDINGS: Cardiac shadow it remains enlarged. Aortic calcifications are again seen. Postsurgical changes are noted. The lungs are clear bilaterally. No acute bony abnormality  is seen. IMPRESSION: No acute abnormality noted. Electronically Signed   By: Linus Mako.D.  On: 08/09/2015 19:31   I have personally reviewed and evaluated these images and lab results as part of my medical decision-making.   EKG Interpretation None      MDM   Final diagnoses:  Abdominal pain  Drop in hemoglobin    Case discussed with Dr. Alfonse Spruce. It is unclear why the patients hemoglobin has dropped from 13 ten days ago to 9.8 today. Due to him being on blood thinners, concern for bleeding but source is unknown.  10:30 pm The pt has small hbg in his urine, neg hemoccult. His CMP shows worsened BUN at 37 and creatinine elevation at 2.16. His INR is 1.66. Waiting for CT abd/pelv. Patient has been asking throughout stay when he can go home and says he is ready to leave. He is able to be redirected and encouraged to stay to complete work-up.  1:51 am CT abd/pelv has resulted showing no acute findings or signs of peritoneal bleeding. The patient is irritated and he was told by the sending provider he would be seeing a cardiologist right when he got here. He is, however, happy to hear the good news and is ready to go home.  He has been given strict return precautions and is in the room walking around when I came in to speak with him.   Delos Haring, PA-C 08/10/15 [id number]  Harvel Quale, MD 08/11/15 [phone]

## 2015-08-09 NOTE — Progress Notes (Addendum)
Cardiology Office Note    Date:  08/09/2015  ID:  Gregory [name], DOB September 26, 1932, MRN [id number] PCP:  Lamar Blinks, MD  Cardiologist:  Dr. Claiborne Billings  Chief Complaint: f/u syncope  History of Present Illness:  Gregory [name] is a 82 y.o. male with history of CAD s/p CABG 2003 (last cath 2008 with patent grafts), chronic systolic CHF/ICM (pt declined ICD), permanent atrial fibrillation, carotid artery disease (pt refused intervention), HTN, HLD, GERD, stroke, kidney stones, splenic infarct 2014 (likely due to AF), renal infarct 07/2015 (in setting of holding Xarelto for 3 days for spinal procedure), CKD II-III who presents for post-hospital follow-up. He was admitted 5/16-5/19/17 after sustaining a syncopal spell at home, resulting in a fall with superficial facial trauma. He noted feeling nauseated with left sided flank and LLQ pain prior to episode, and denied any focal neuro weakness or paresthesia, chest pains, SOB, or dizziness. His family denied any seizure activity. He had recently held his Xarelto x 3 days for a spinal procedure and was subsequently found to have a left renal infarct. He was also diagnosed with AKI versus suspected CKD stage II-III. 2D Echo 07/31/15 showed EF 55-60%, mildly dilated ascending aorta, mildly dilated LA. Trop neg x 3. Hgb 13. His syncope was felt likely due to vasovagal event in the setting of acute pain related to his renal infarct. Cannot totally exclude symptomatic carotid artery disease, but the patient again has adamantly refused further intervention. Xarelto dose was adjusted for renal insufficiency with plans to recheck as outpatient.   He comes in for follow-up overall doing well from a cardiac standpoint. No recurrent syncope. No dizziness. No chest pain or dyspnea. He continues to have intermittent flank pain - sees PCP next week for this. No bruising. No bleeding reported.   Past Medical History  Diagnosis Date  . CAD (coronary artery disease),CABG  1993-LIMA to the LAD, SVG to Om and SVG to PDA/PLA, patent on cath 2008. 2003    a. s/p CABG 2003. b. last cath 2008 with patent grafts.  . Chronic systolic CHF (congestive heart failure) ([place])     a. Prior low EF, later normalized.  Marland Kitchen Heart murmur   . Chronic atrial fibrillation (HCC)     permanent  . Essential hypertension   . Hyperlipidemia   . Heart attack ([place]) 1980's  . GERD (gastroesophageal reflux disease)   . Stroke [facility]) 2007    "slightly drags left foot since; recovered qthing else" (01/21/2013)  . Kidney stones     "passed all but one time when he had to have lithotripsy" (01/21/2013)  . Ischemic cardiomyopathy   . 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 SSS (sick sinus syndrome) ([place])   . Pulmonary hypertension ([place])   . Arthritis   . Syncope 07/2015    a. felt vasovagal in setting of pain from renal infarct.  Marland Kitchen Appendicitis with abscess 03/31/2011  . 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)     a. 07/2015 - after holding Xarelto x 3 days for spinal procedure.  . Carotid artery disease ([place])     a. Severe - pt refused intervention in the past.    Past Surgical History  Procedure Laterality Date  . Laparoscopic appendectomy  03/30/2011    Procedure: APPENDECTOMY LAPAROSCOPIC;  Surgeon: Joyice Faster. Cornett, MD;  Location: [facility] OR;  Service: General;  Laterality: N/A;  . Cataract extraction w/ intraocular lens implant Bilateral 2013  . Esophagogastroduodenoscopy  02/01/2012    Procedure: ESOPHAGOGASTRODUODENOSCOPY (EGD);  Surgeon: Tory Emerald  Benson Norway, MD;  Location: [facility];  Service: Endoscopy;  Laterality: N/A;  . Coronary artery bypass graft  03/01/2002    LIMA to LAD, reverse SVG to OM, reverse SVG to PDA of RCA, reverse SVG to PLA of RCA, ligation of LA appendage (Dr. Servando Snare)  . Lithotripsy      "once" (01/21/2013)  . Tee without cardioversion N/A 01/23/2013    Procedure: TRANSESOPHAGEAL ECHOCARDIOGRAM (TEE);  Surgeon: Sanda Klein, MD;  Location: [facility] ENDOSCOPY;  Service: Cardiovascular;  Laterality:  N/A;  . Transthoracic echocardiogram  06/23/2012    EF 40-45%, mild LVH; mild AV regurg; mild MV regurg; LV mod-severely dilated; RV mildly dilated; systolic pressure borderline increased; RA mod dilated  . Nm myocar perf wall motion  05/2009    persantine myoview - fixed moderate perfusion defect in inferior wall & lateral segment of apex (poor non-transmural infarction), minimal anterolateral periinfarct reversible ischemia seen, abnormal study, defects similar to 2006 study  . Coronary angioplasty  07/05/2006    3 vessel CAD, patent LIMA to LAD, patentVG to OM, patent SVG to PDA & PLA, mild MR, severe LV systolic dysfunction (Dr. Gerrie Nordmann)  . Cardiac catheterization  02/24/2002    reduced LV function, 60-70% prox RCA stenosis, 70% PLA ostial stenosis, 80% secondary branch of PLA stenosis - subsequent CABG (Dr. Jackie Plum)  . Carotid doppler  06/2012    50-69% right bulb/prox ICA diameter reduction; 70-99% left bulb/prox ICA diameter reduction  . Splenectomy, total  01/2013  . Left heart catheterization with coronary angiogram N/A 01/24/2013    Procedure: LEFT HEART CATHETERIZATION WITH CORONARY ANGIOGRAM;  Surgeon: [name], MD;  Location: [facility] CATH LAB;  Service: Cardiovascular;  Laterality: N/A;  Right heart with grafts  . Esophagogastroduodenoscopy N/A 05/22/2015    Procedure: ESOPHAGOGASTRODUODENOSCOPY (EGD);  Surgeon: Gatha Mayer, MD;  Location: [facility];  Service: Endoscopy;  Laterality: N/A;  . Balloon dilation N/A 05/22/2015    Procedure: BALLOON DILATION;  Surgeon: Gatha Mayer, MD;  Location: [facility] ENDOSCOPY;  Service: Endoscopy;  Laterality: N/A;    Current Medications: Outpatient Prescriptions Prior to Visit  Medication Sig Dispense Refill  . acetaminophen (TYLENOL) 500 MG tablet Take 500 mg by mouth every 6 (six) hours as needed for mild pain.     Marland Kitchen aspirin EC 81 MG tablet Take 81 mg by mouth daily.    . carvedilol (COREG) 3.125 MG tablet TAKE 3 TABLETS BY MOUTH TWICE  DAILY WITH MEALS 180 tablet 0  . feeding supplement, ENSURE ENLIVE, (ENSURE ENLIVE) LIQD Take 237 mLs by mouth 2 (two) times daily between meals. 237 mL 12  . furosemide (LASIX) 40 MG tablet Take 0.5 tablets (20 mg total) by mouth daily. 30 tablet 5  . HYDROcodone-acetaminophen (NORCO/VICODIN) 5-325 MG tablet Take 1 tablet by mouth every 4 (four) hours as needed for moderate pain or severe pain. 30 tablet 0  . lisinopril (PRINIVIL,ZESTRIL) 10 MG tablet TAKE 1 AND 1/2 TABLETS(15 MG) BY MOUTH DAILY 45 tablet 4  . pantoprazole (PROTONIX) 40 MG tablet TAKE 1 TABLET BY MOUTH EVERY DAY 90 tablet 3  . Rivaroxaban (XARELTO) 15 MG TABS tablet Take 1 tablet (15 mg total) by mouth daily with supper. 30 tablet 2  . rosuvastatin (CRESTOR) 40 MG tablet Take 1 tablet (40 mg total) by mouth daily. 30 tablet 6  . traMADol (ULTRAM) 50 MG tablet Take a 1/2 tablet every 8 hours as needed for pain (Patient taking differently: Take 25 mg by mouth every  8 (eight) hours as needed for moderate pain. Take a 1/2 tablet every 8 hours as needed for pain) 30 tablet 0   No facility-administered medications prior to visit.     Allergies:   Percocet   Social History   Social History  . Marital Status: Married    Spouse Name: N/A  . Number of Children: 35  . Years of Education: 8   Occupational History  . retired    Social History Main Topics  . Smoking status: Former Smoker -- 2.00 packs/day for 20 years    Types: Cigarettes    Quit date: 04/08/1986  . Smokeless tobacco: Current User    Types: Chew  . Alcohol Use: No  . Drug Use: No  . Sexual Activity: Not Asked   Other Topics Concern  . None   Social History Narrative   A she is married, 5 sons 4 daughters. He is retired Horticulturist, commercial. 2-3 caffeinated beverages a day no alcohol     Family History:  The patient's family history includes Heart attack (age of onset: 9) in his brother; Heart disease in his father; Stroke in his mother; Stroke (age of onset: 50)  in his brother.   ROS:   Please see the history of present illness.  All other systems are reviewed and otherwise negative.    PHYSICAL EXAM:   VS:  BP 115/62 mmHg  Pulse 67  Ht 5\' 6"  (1.676 m)  Wt 193 lb 12.8 oz (87.907 kg)  BMI 31.30 kg/m2  SpO2 98%  BMI: Body mass index is 31.3 kg/(m^2). GEN: Well nourished, well developed elderly WM, in no acute distress HEENT: normocephalic, atraumatic Neck: no JVD, carotid bruits, or masses - carotid sounds are diminished bilaterally Cardiac: irregular; no murmurs, rubs, or gallops, no edema  Respiratory:  clear to auscultation bilaterally, normal work of breathing GI: soft MS: no deformity or atrophy Skin: warm and dry, no rash - no ecchymosis on left flank where patient indicates intermittent pain Neuro:  Alert and Oriented x 3, Strength and sensation are intact, follows commands Psych: euthymic mood, full affect  Wt Readings from Last 3 Encounters:  08/09/15 193 lb 12.8 oz (87.907 kg)  08/02/15 191 lb (86.637 kg)  05/22/15 192 lb (87.091 kg)      Studies/Labs Reviewed:   EKG:  EKG was not ordered today  Recent Labs: 07/30/2015: Hemoglobin 13.0; Platelets 214 07/31/2015: BUN 23*; Creatinine, Ser 1.85*; Potassium 4.1; Sodium 137   Lipid Panel    Component Value Date/Time   CHOL 167 07/26/2014 1616   TRIG 82 07/26/2014 1616   HDL 45 07/26/2014 1616   CHOLHDL 3.7 07/26/2014 1616   VLDL 16 07/26/2014 1616   LDLCALC 106* 07/26/2014 1616    Additional studies/ records that were reviewed today include: Summarized above.    ASSESSMENT & PLAN:   1. Syncope, suspected vasovagal due to pain related to renal infarct - no recurrence. As above, cannot fully rule out symptomatic carotid disease but patient continues to refuse intervention. If symptoms recur would consider outpatient cardiac monitoring. Addendum: CBC done since patient continues to have flank pain. Hgb has dropped into the 10 range. D/w Dr. Radford Pax who recommends  proceed to ER to consider evaluation for possible bleeding related his renal infarct. 2. Renal insuffiency - recheck BMET today. I suspect he likely has a degree of CKD. 3. Chronic atrial fibrillation - recheck BMET today to determine if we need to continue Xarelto at decreased dose. Will also  check CBC to ensure no blood loss related to recent renal infarct. 4. Carotid artery disease - patient continues to refuse further intervention, citing he's "never had problems" related to this. I explained to him that we could not rule out carotid disease causing his syncope. He still affirms his refusal. I told him to let us know if he reconsiders. 5. CAD - no recent anginal symptoms. Continue ASA, BB, statin. Will defer decision regarding long term aspirin therapy (given concomitant anticoagulation) to primary cardiologist. It may be reasonable for him to remain on this given his carotid disease as long as there is no evidence of bleeding.  Disposition: F/u with Dr. Claiborne Billings in 6-8 weeks.   Medication Adjustments/Labs and Tests Ordered: Current medicines are reviewed at length with the patient today.  Concerns regarding medicines are outlined above. Medication changes, Labs and Tests ordered today are listed in the Patient Instructions below.  Raechel Ache PA-C  08/09/2015 9:10 AM    [facility] [address], [place], [place]  [address] Phone: [phone]; Fax: [phone]

## 2015-08-09 NOTE — ED Notes (Signed)
Occult blood was NEGATIVE

## 2015-08-09 NOTE — Patient Instructions (Signed)
Medication Instructions:  Your physician recommends that you continue on your current medications as directed. Please refer to the Current Medication list given to you today.  Labwork: BMP & CBC today   (we will call you with the results)  Testing/Procedures: None ordered  Follow-Up: Your physician recommends that you schedule a follow-up appointment in: 6-8 weeks with Dr. Claiborne Billings.  If you need a refill on your cardiac medications before your next appointment, please call your pharmacy.  Thank you for choosing [facility]!!

## 2015-08-09 NOTE — ED Notes (Signed)
The pt was just released from this hosp last Friday.  He had a clot that went to his kidneys.  Today he was seen in the doctors office blood drawn and he was called at home and was told his hgb had dropped 3 gms since the last blood draw ?? Last friday

## 2015-08-09 NOTE — ED Notes (Signed)
C/o back pain  Minimal pain at present.  He last took pain med this am

## 2015-08-10 ENCOUNTER — Emergency Department (HOSPITAL_COMMUNITY): Payer: Medicare Other

## 2015-08-10 DIAGNOSIS — N200 Calculus of kidney: Secondary | ICD-10-CM | POA: Diagnosis not present

## 2015-08-10 NOTE — Discharge Instructions (Signed)
If you develop any new or worsening symptoms please him to the emergency department for reevaluation. If you notice any bruising, and darkening of urine, blood in your stool or any bleeding return to the emergency department. He developed fatigue or weakness, fever return to the emergency department. Otherwise follow-up with your primary care doctor or your cardiologist.

## 2015-08-10 NOTE — ED Notes (Signed)
Pt continues to wait for CT 

## 2015-08-10 NOTE — ED Notes (Signed)
Pt to CT at this time.

## 2015-08-15 ENCOUNTER — Telehealth: Payer: Self-pay | Admitting: Cardiovascular Disease

## 2015-08-15 ENCOUNTER — Ambulatory Visit: Payer: Medicare Other | Admitting: Family Medicine

## 2015-08-15 ENCOUNTER — Ambulatory Visit (INDEPENDENT_AMBULATORY_CARE_PROVIDER_SITE_OTHER): Payer: Medicare Other | Admitting: Family Medicine

## 2015-08-15 ENCOUNTER — Encounter: Payer: Self-pay | Admitting: Family Medicine

## 2015-08-15 VITALS — BP 135/64 | HR 55 | Temp 97.9°F | Ht 66.0 in | Wt 194.2 lb

## 2015-08-15 DIAGNOSIS — N289 Disorder of kidney and ureter, unspecified: Secondary | ICD-10-CM

## 2015-08-15 DIAGNOSIS — D539 Nutritional anemia, unspecified: Secondary | ICD-10-CM | POA: Diagnosis not present

## 2015-08-15 DIAGNOSIS — N23 Unspecified renal colic: Secondary | ICD-10-CM | POA: Diagnosis not present

## 2015-08-15 MED ORDER — HYDROCODONE-ACETAMINOPHEN 5-325 MG PO TABS: 1.0000 | ORAL_TABLET | ORAL | Status: DC | PRN

## 2015-08-15 NOTE — Telephone Encounter (Signed)
Returned call to daughter, ok per DPR.  She wanted to make sure it was ok pt's f/u appt was not until September. Pt is doing fine at this time and refuses to see an APP. Daughter wants to know if pt should increase his Xarelto back to 20 mg. He was decreased to 15mg  r/t kidney function.  Will route to Dr Claiborne Billings and pharmacy for advice on Xarelto medication.

## 2015-08-15 NOTE — Progress Notes (Signed)
Pre visit review using our clinic review tool, if applicable. No additional management support is needed unless otherwise documented below in the visit note. 

## 2015-08-15 NOTE — Patient Instructions (Signed)
It was nice to see you today!  I will be in touch with your labs- we will see how your kidneys are feeling and will check your blood level today

## 2015-08-15 NOTE — Telephone Encounter (Signed)
New message   Pt daughter wants a call back for when he is suppose to come in for follow up with Dr.Kelly

## 2015-08-15 NOTE — Progress Notes (Addendum)
[place] at [facility] [address] [place], [place] [address] [phone]  Date:  08/15/2015   Name:  Gregory [name]   DOB:  [birth year]   MRN:  [id number]  PCP:  Lamar Blinks, MD    Chief Complaint: Hospitalization Follow-up   History of Present Illness:  Gregory [name] is a 82 y.o. very pleasant male patient who presents with the following:  Here today to follow-up from recent hospital stay from 5/16 to 5/19; he had an episode of syncope and fell.  Syncope thought due to severe abd pain from a renal infarct due to clot.    He suffered facial and left arm contusions when he fell Also history of CAD s/p CABG, CHF, SA node dysfunction/ chronic a fib, on xarelto anticoagulation  The xarelto was stopped for 3 days so he could get an epidural steroid injection.  He was able to get the injection and was back on the xarelto when he began having sx of the renal infarct   He is back on his blood thinner now but at a reduced dose due to reduced renal function He saw his cardiologist last week  Also last week his hg was noted to have dropped 3gm- he was seen at the ER again on 5/26 for evaluation for any bleeding. No acute bleeding was discovered.   He will use vicodin as needed for pain- he needs it only when he is active, ok at rest.  Needs a RF of this today. He is able to tolerate this medication- itching and nausea only with percocet  Patient Active Problem List   Diagnosis Date Noted  . Renal insufficiency 08/06/2015  . Vasovagal syncope   . Coronary artery disease involving native coronary artery   . 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 Syncope 07/30/2015  . Dysphagia   . Esophageal stricture   . Carotid stenosis 07/13/2013  . Anticoagulated on Xarelto 03/21/2013  . PVD - 123456 LICA, [id number] RICA by doppler 5/14 01/24/2013  . 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 CAD  (coronary artery disease),CABG 1993-LIMA to the LAD, SVG to Om and SVG to PDA/PLA, patent on cath 2014. 03/31/2011  . Chronic a-fib ([place]) 03/31/2011  . Cardiomyopathy, ischemic, improved 03/31/2011  . CVA (cerebral vascular accident) ([place]) 03/31/2011  . GERD (gastroesophageal reflux disease) 03/31/2011  . Renal calculi 03/31/2011  . History of tobacco use, continues with chewing tobacco 03/31/2011    Past Medical History  Diagnosis Date  . CAD (coronary artery disease),CABG 1993-LIMA to the LAD, SVG to Om and SVG to PDA/PLA, patent on cath 2008. 2003    a. s/p CABG 2003. b. last cath 2008 with patent grafts.  . Chronic systolic CHF (congestive heart failure) ([place])     a. Prior low EF, later normalized.  Marland Kitchen Heart murmur   . Chronic atrial fibrillation (HCC)     permanent  . Essential hypertension   . Hyperlipidemia   . Heart attack ([place]) 1980's  . GERD (gastroesophageal reflux disease)   . Stroke [facility]) 2007    "slightly drags left foot since; recovered qthing else" (01/21/2013)  . Kidney stones     "passed all but one time when he had to have lithotripsy" (01/21/2013)  . Ischemic cardiomyopathy   . 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 SSS (sick sinus syndrome) ([place])   . Pulmonary hypertension ([place])   . Arthritis   .  Syncope 07/2015    a. felt vasovagal in setting of pain from renal infarct.  Marland Kitchen Appendicitis with abscess 03/31/2011  . Renal infarct ([place])     a. 07/2015 - after holding Xarelto x 3 days for spinal procedure.  . Carotid artery disease ([place])     a. Severe - pt refused intervention in the past.    Past Surgical History  Procedure Laterality Date  . Laparoscopic appendectomy  03/30/2011    Procedure: APPENDECTOMY LAPAROSCOPIC;  Surgeon: Joyice Faster. Cornett, MD;  Location: [facility] OR;  Service: General;  Laterality: N/A;  . Cataract extraction w/ intraocular lens implant Bilateral 2013  . Esophagogastroduodenoscopy  02/01/2012    Procedure: ESOPHAGOGASTRODUODENOSCOPY (EGD);  Surgeon:  Beryle Beams, MD;  Location: [facility];  Service: Endoscopy;  Laterality: N/A;  . Coronary artery bypass graft  03/01/2002    LIMA to LAD, reverse SVG to OM, reverse SVG to PDA of RCA, reverse SVG to PLA of RCA, ligation of LA appendage (Dr. Servando Snare)  . Lithotripsy      "once" (01/21/2013)  . Tee without cardioversion N/A 01/23/2013    Procedure: TRANSESOPHAGEAL ECHOCARDIOGRAM (TEE);  Surgeon: Sanda Klein, MD;  Location: [facility] ENDOSCOPY;  Service: Cardiovascular;  Laterality: N/A;  . Transthoracic echocardiogram  06/23/2012    EF 40-45%, mild LVH; mild AV regurg; mild MV regurg; LV mod-severely dilated; RV mildly dilated; systolic pressure borderline increased; RA mod dilated  . Nm myocar perf wall motion  05/2009    persantine myoview - fixed moderate perfusion defect in inferior wall & lateral segment of apex (poor non-transmural infarction), minimal anterolateral periinfarct reversible ischemia seen, abnormal study, defects similar to 2006 study  . Coronary angioplasty  07/05/2006    3 vessel CAD, patent LIMA to LAD, patentVG to OM, patent SVG to PDA & PLA, mild MR, severe LV systolic dysfunction (Dr. Gerrie Nordmann)  . Cardiac catheterization  02/24/2002    reduced LV function, 60-70% prox RCA stenosis, 70% PLA ostial stenosis, 80% secondary branch of PLA stenosis - subsequent CABG (Dr. Jackie Plum)  . Carotid doppler  06/2012    50-69% right bulb/prox ICA diameter reduction; 70-99% left bulb/prox ICA diameter reduction  . Splenectomy, total  01/2013  . Left heart catheterization with coronary angiogram N/A 01/24/2013    Procedure: LEFT HEART CATHETERIZATION WITH CORONARY ANGIOGRAM;  Surgeon: [name], MD;  Location: [facility] CATH LAB;  Service: Cardiovascular;  Laterality: N/A;  Right heart with grafts  . Esophagogastroduodenoscopy N/A 05/22/2015    Procedure: ESOPHAGOGASTRODUODENOSCOPY (EGD);  Surgeon: Gatha Mayer, MD;  Location: [facility]OS[facility];  Service: Endoscopy;  Laterality: N/A;  .  Balloon dilation N/A 05/22/2015    Procedure: BALLOON DILATION;  Surgeon: Gatha Mayer, MD;  Location: [facility] ENDOSCOPY;  Service: Endoscopy;  Laterality: N/A;    Social History  Substance Use Topics  . Smoking status: Former Smoker -- 2.00 packs/day for 20 years    Types: Cigarettes    Quit date: 04/08/1986  . Smokeless tobacco: Current User    Types: Chew  . Alcohol Use: No    Family History  Problem Relation Age of Onset  . Heart disease Father     rheumatic fever  . Heart attack Brother 45  . Stroke Mother   . Stroke Brother 46    Allergies  Allergen Reactions  . Percocet [Oxycodone-Acetaminophen] Itching and Nausea Only    Medication list has been reviewed and updated.  Current Outpatient Prescriptions on File Prior to Visit  Medication  Sig Dispense Refill  . aspirin EC 81 MG tablet Take 81 mg by mouth daily.    . carvedilol (COREG) 3.125 MG tablet TAKE 3 TABLETS BY MOUTH TWICE DAILY WITH MEALS 180 tablet 0  . Cholecalciferol (VITAMIN D-3 PO) Take 1 capsule by mouth daily.    . Cod Liver Oil OIL Take 1 capsule by mouth daily.    . furosemide (LASIX) 40 MG tablet Take 0.5 tablets (20 mg total) by mouth daily. 30 tablet 5  . HYDROcodone-acetaminophen (NORCO/VICODIN) 5-325 MG tablet Take 1 tablet by mouth every 4 (four) hours as needed for moderate pain or severe pain. 30 tablet 0  . lisinopril (PRINIVIL,ZESTRIL) 10 MG tablet TAKE 1 AND 1/2 TABLETS(15 MG) BY MOUTH DAILY (Patient taking differently: Take 15 mg by mouth nightly) 45 tablet 4  . Multiple Vitamins-Minerals (HAIR SKIN NAILS PO) Take 2 tablets by mouth daily.    . pantoprazole (PROTONIX) 40 MG tablet TAKE 1 TABLET BY MOUTH EVERY DAY 90 tablet 3  . Rivaroxaban (XARELTO) 15 MG TABS tablet Take 1 tablet (15 mg total) by mouth daily with supper. 30 tablet 2  . rosuvastatin (CRESTOR) 40 MG tablet Take 1 tablet (40 mg total) by mouth daily. 30 tablet 6  . traMADol (ULTRAM) 50 MG tablet Take 25 mg by mouth every 6 (six)  hours as needed for moderate pain (back pain).     No current facility-administered medications on file prior to visit.    Review of Systems:  As per HPI- otherwise negative. He notes a good appetite. Feels well except for pain in his back which he is not sure is from his renal infarction vs his baseline back pain   Physical Examination: Filed Vitals:   08/15/15 1448  BP: 135/64  Pulse: 55  Temp: 97.9 F (36.6 C)   Filed Vitals:   08/15/15 1448  Height: 5\' 6"  (1.676 m)  Weight: 194 lb 3.2 oz (88.089 kg)   Body mass index is 31.36 kg/(m^2). Ideal Body Weight: Weight in (lb) to have BMI = 25: 154.6  GEN: WDWN, NAD, Non-toxic, A & O x 3, looks well HEENT: Atraumatic, Normocephalic. Neck supple. No masses, No LAD. Ears and Nose: No external deformity. CV: irregular rhythm c/w chronic a fib, No M/G/R. No JVD. No thrill. No extra heart sounds. PULM: CTA B, no wheezes, crackles, rhonchi. No retractions. No resp. distress. No accessory muscle use. EXTR: No c/c/e.  Bruises and abrasion about the left elbow but he has full ROM here  NEURO Normal gait.  PSYCH: Normally interactive. Conversant. Not depressed or anxious appearing.  Calm demeanor.    Assessment and Plan: Acute renal insufficiency - Plan: Basic metabolic panel  Deficiency anemia - Plan: CBC  Kidney pain - Plan: HYDROcodone-acetaminophen (NORCO/VICODIN) 5-325 MG tablet  Recheck his kidney function- hope to see return of normal function soon. Counseled that we are not sure when he will stop having pain from his kidney.  Will refill his vicodin today Recheck CBC today- if he continues to show decreasing hemoglobin may need GI referral  Signed Lamar Blinks, MD  Called with labs 6/5- LMOM.  Renal function is a bit worse, anemia is stable. We need to follow-up closely.  Please see me this week or next week to recheck  Results for orders placed or performed in visit on 08/15/15  CBC  Result Value Ref Range   WBC  6.8 4.0 - 10.5 K/uL   RBC 3.52 (L) 4.22 - 5.81 Mil/uL  Platelets 363.0 150.0 - 400.0 K/uL   Hemoglobin 10.1 (L) 13.0 - 17.0 g/dL   HCT 30.5 (L) 39.0 - 52.0 %   MCV 86.9 78.0 - 100.0 fl   MCHC 32.9 30.0 - 36.0 g/dL   RDW 16.6 (H) 11.5 - [id number] %  Basic metabolic panel  Result Value Ref Range   Sodium 142 135 - 145 mEq/L   Potassium 4.0 3.5 - 5.1 mEq/L   Chloride 104 96 - 112 mEq/L   CO2 26 19 - 32 mEq/L   Glucose, Bld 126 (H) 70 - 99 mg/dL   BUN 40 (H) 6 - 23 mg/dL   Creatinine, Ser 2.28 (H) 0.40 - 1.50 mg/dL   Calcium 9.1 8.4 - 10.5 mg/dL   GFR 29.42 (L) >60.00 mL/min

## 2015-08-16 LAB — BASIC METABOLIC PANEL
BUN: 40 mg/dL — AB (ref 6–23)
CALCIUM: 9.1 mg/dL (ref 8.4–10.5)
CO2: 26 meq/L (ref 19–32)
CREATININE: 2.28 mg/dL — AB (ref 0.40–1.50)
Chloride: 104 mEq/L (ref 96–112)
GFR: 29.42 mL/min — ABNORMAL LOW (ref >60.00)
Glucose, Bld: 126 mg/dL — ABNORMAL HIGH (ref 70–99)
Potassium: 4 mEq/L (ref 3.5–5.1)
SODIUM: 142 meq/L (ref 135–145)

## 2015-08-16 LAB — CBC
HEMATOCRIT: 30.5 % — AB (ref 39.0–52.0)
Hemoglobin: 10.1 g/dL — ABNORMAL LOW (ref 13.0–17.0)
MCHC: 32.9 g/dL (ref 30.0–36.0)
MCV: 86.9 fl (ref 78.0–100.0)
Platelets: 363 10*3/uL (ref 150.0–400.0)
RBC: 3.52 Mil/uL — AB (ref 4.22–5.81)
RDW: 16.6 % — AB (ref 11.5–15.5)
WBC: 6.8 10*3/uL (ref 4.0–10.5)

## 2015-08-19 NOTE — Telephone Encounter (Signed)
Returned call to pt daughter about Xarelto dose. His kidney function has not improved based on labs from last week. His dose should be the lower dose based on CrCl. Will have him remain on Xarelto 15mg . She inquired about new copay card. Will leave copay card at front desk. Pt's daughter voiced understanding and thanked me for call.

## 2015-08-20 ENCOUNTER — Telehealth: Payer: Self-pay | Admitting: Family Medicine

## 2015-08-20 NOTE — Telephone Encounter (Signed)
Called his daughter to discuss- renal function is minimally worse.  He continues to feel well and his back pain is better.  They will see me next week for a recheck

## 2015-08-20 NOTE — Telephone Encounter (Signed)
Caller Name: [name]  Relation to [id number]  Call back [phone] Pharmacy:  Reason for call:  Daughter returning PCP call regarding lab results and would like further instructions. Daughter scheduled follow up appointment for 08/28/15. Please advise

## 2015-08-28 ENCOUNTER — Encounter: Payer: Self-pay | Admitting: Family Medicine

## 2015-08-28 ENCOUNTER — Ambulatory Visit (INDEPENDENT_AMBULATORY_CARE_PROVIDER_SITE_OTHER): Payer: Medicare Other | Admitting: Family Medicine

## 2015-08-28 VITALS — BP 110/60 | HR 62 | Temp 97.5°F | Ht 66.0 in | Wt 188.2 lb

## 2015-08-28 DIAGNOSIS — D6489 Other specified anemias: Secondary | ICD-10-CM

## 2015-08-28 DIAGNOSIS — I481 Persistent atrial fibrillation: Secondary | ICD-10-CM | POA: Diagnosis not present

## 2015-08-28 DIAGNOSIS — N289 Disorder of kidney and ureter, unspecified: Secondary | ICD-10-CM | POA: Diagnosis not present

## 2015-08-28 DIAGNOSIS — I4819 Other persistent atrial fibrillation: Secondary | ICD-10-CM

## 2015-08-28 LAB — CBC
HCT: 35.1 % — ABNORMAL LOW (ref 39.0–52.0)
Hemoglobin: 11.4 g/dL — ABNORMAL LOW (ref 13.0–17.0)
MCHC: 32.5 g/dL (ref 30.0–36.0)
MCV: 87.3 fl (ref 78.0–100.0)
Platelets: 223 10*3/uL (ref 150.0–400.0)
RBC: 4.02 Mil/uL — AB (ref 4.22–5.81)
RDW: 17 % — AB (ref 11.5–15.5)
WBC: 6.4 10*3/uL (ref 4.0–10.5)

## 2015-08-28 LAB — BASIC METABOLIC PANEL
BUN: 25 mg/dL — ABNORMAL HIGH (ref 6–23)
CALCIUM: 9.4 mg/dL (ref 8.4–10.5)
CO2: 30 meq/L (ref 19–32)
CREATININE: 2 mg/dL — AB (ref 0.40–1.50)
Chloride: 104 mEq/L (ref 96–112)
GFR: 34.22 mL/min — ABNORMAL LOW (ref >60.00)
Glucose, Bld: 107 mg/dL — ABNORMAL HIGH (ref 70–99)
Potassium: 4.7 mEq/L (ref 3.5–5.1)
Sodium: 141 mEq/L (ref 135–145)

## 2015-08-28 NOTE — Progress Notes (Addendum)
[place] at [facility] [address] [place], [place] [address] [phone]  Date:  08/28/2015   Name:  Gregory [name]   DOB:  1932/11/29   MRN:  [id number]  PCP:  Lamar Blinks, MD    Chief Complaint: Follow-up   History of Present Illness:  Gregory [name] is a 82 y.o. very pleasant male patient who presents with the following:  Seen by myself 2 weeks ago with following HPI:  Here today to follow-up from recent hospital stay from 5/16 to 5/19; he had an episode of syncope and fell. Syncope thought due to severe abd pain from a renal infarct due to clot. He suffered facial and left arm contusions when he fell Also history of CAD s/p CABG, CHF, SA node dysfunction/ chronic a fib, on xarelto anticoagulation  Following up today- needs repeat renal function and CBC His back pain is resolved!  Went away a couple of days after our last visit He is eating well No belly pain or fever  He did mowing over the weekend and has been very active per his baseline  He has not noted any sx of hypotension  He does have a fib which is longstanding, on xarelto  BP noted to be a bit low today He is taking lisinopril- 10 mg one day and 20 the next, alternating.    BP Readings from Last 3 Encounters:  08/28/15 107/51  08/15/15 135/64  08/10/15 131/59     Wt Readings from Last 3 Encounters:  08/28/15 188 lb 3.2 oz (85.367 kg)  08/15/15 194 lb 3.2 oz (88.089 kg)  08/09/15 193 lb 12.8 oz (87.907 kg)     Patient Active Problem List   Diagnosis Date Noted  . Renal insufficiency 08/06/2015  . Vasovagal syncope   . Coronary artery disease involving native coronary artery   . 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 Syncope 07/30/2015  . Dysphagia   . Esophageal stricture   . Carotid stenosis 07/13/2013  . Anticoagulated on Xarelto 03/21/2013  . PVD - 123456 LICA, [id number] RICA by doppler 5/14 01/24/2013  . Thrombus of left atrial appendage  on TEE 01/23/13 01/24/2013  . 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 CAD (coronary artery disease),CABG 1993-LIMA to the LAD, SVG to Om and SVG to PDA/PLA, patent on cath 2014. 03/31/2011  . Chronic a-fib ([place]) 03/31/2011  . Cardiomyopathy, ischemic, improved 03/31/2011  . CVA (cerebral vascular accident) ([place]) 03/31/2011  . GERD (gastroesophageal reflux disease) 03/31/2011  . Renal calculi 03/31/2011  . History of tobacco use, continues with chewing tobacco 03/31/2011    Past Medical History  Diagnosis Date  . CAD (coronary artery disease),CABG 1993-LIMA to the LAD, SVG to Om and SVG to PDA/PLA, patent on cath 2008. 2003    a. s/p CABG 2003. b. last cath 2008 with patent grafts.  . Chronic systolic CHF (congestive heart failure) ([place])     a. Prior low EF, later normalized.  Marland Kitchen Heart murmur   . Chronic atrial fibrillation (HCC)     permanent  . Essential hypertension   . Hyperlipidemia   . Heart attack ([place]) 1980's  . GERD (gastroesophageal reflux disease)   . Stroke [facility]) 2007    "slightly drags left foot since; recovered qthing else" (01/21/2013)  . Kidney stones     "passed all but one time when he had to have lithotripsy" (01/21/2013)  . Ischemic cardiomyopathy   . 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  SSS (sick sinus syndrome) ([place])   . Pulmonary hypertension ([place])   . Arthritis   . Syncope 07/2015    a. felt vasovagal in setting of pain from renal infarct.  Marland Kitchen Appendicitis with abscess 03/31/2011  . Renal infarct ([place])     a. 07/2015 - after holding Xarelto x 3 days for spinal procedure.  . Carotid artery disease ([place])     a. Severe - pt refused intervention in the past.    Past Surgical History  Procedure Laterality Date  . Laparoscopic appendectomy  03/30/2011    Procedure: APPENDECTOMY LAPAROSCOPIC;  Surgeon: Joyice Faster. Cornett, MD;  Location: [facility] OR;  Service: General;  Laterality: N/A;  . Cataract extraction w/ intraocular lens implant Bilateral 2013  . Esophagogastroduodenoscopy   02/01/2012    Procedure: ESOPHAGOGASTRODUODENOSCOPY (EGD);  Surgeon: Beryle Beams, MD;  Location: [facility];  Service: Endoscopy;  Laterality: N/A;  . Coronary artery bypass graft  03/01/2002    LIMA to LAD, reverse SVG to OM, reverse SVG to PDA of RCA, reverse SVG to PLA of RCA, ligation of LA appendage (Dr. Servando Snare)  . Lithotripsy      "once" (01/21/2013)  . Tee without cardioversion N/A 01/23/2013    Procedure: TRANSESOPHAGEAL ECHOCARDIOGRAM (TEE);  Surgeon: Sanda Klein, MD;  Location: [facility] ENDOSCOPY;  Service: Cardiovascular;  Laterality: N/A;  . Transthoracic echocardiogram  06/23/2012    EF 40-45%, mild LVH; mild AV regurg; mild MV regurg; LV mod-severely dilated; RV mildly dilated; systolic pressure borderline increased; RA mod dilated  . Nm myocar perf wall motion  05/2009    persantine myoview - fixed moderate perfusion defect in inferior wall & lateral segment of apex (poor non-transmural infarction), minimal anterolateral periinfarct reversible ischemia seen, abnormal study, defects similar to 2006 study  . Coronary angioplasty  07/05/2006    3 vessel CAD, patent LIMA to LAD, patentVG to OM, patent SVG to PDA & PLA, mild MR, severe LV systolic dysfunction (Dr. Gerrie Nordmann)  . Cardiac catheterization  02/24/2002    reduced LV function, 60-70% prox RCA stenosis, 70% PLA ostial stenosis, 80% secondary branch of PLA stenosis - subsequent CABG (Dr. Jackie Plum)  . Carotid doppler  06/2012    50-69% right bulb/prox ICA diameter reduction; 70-99% left bulb/prox ICA diameter reduction  . Splenectomy, total  01/2013  . Left heart catheterization with coronary angiogram N/A 01/24/2013    Procedure: LEFT HEART CATHETERIZATION WITH CORONARY ANGIOGRAM;  Surgeon: [name], MD;  Location: [facility] CATH LAB;  Service: Cardiovascular;  Laterality: N/A;  Right heart with grafts  . Esophagogastroduodenoscopy N/A 05/22/2015    Procedure: ESOPHAGOGASTRODUODENOSCOPY (EGD);  Surgeon: Gatha Mayer, MD;   Location: [facility];  Service: Endoscopy;  Laterality: N/A;  . Balloon dilation N/A 05/22/2015    Procedure: BALLOON DILATION;  Surgeon: Gatha Mayer, MD;  Location: [facility] ENDOSCOPY;  Service: Endoscopy;  Laterality: N/A;    Social History  Substance Use Topics  . Smoking status: Former Smoker -- 2.00 packs/day for 20 years    Types: Cigarettes    Quit date: 04/08/1986  . Smokeless tobacco: Current User    Types: Chew  . Alcohol Use: No    Family History  Problem Relation Age of Onset  . Heart disease Father     rheumatic fever  . Heart attack Brother 71  . Stroke Mother   . Stroke Brother 76    Allergies  Allergen Reactions  . Percocet [Oxycodone-Acetaminophen] Itching and Nausea Only  Medication list has been reviewed and updated.  Current Outpatient Prescriptions on File Prior to Visit  Medication Sig Dispense Refill  . aspirin EC 81 MG tablet Take 81 mg by mouth daily.    . carvedilol (COREG) 3.125 MG tablet TAKE 3 TABLETS BY MOUTH TWICE DAILY WITH MEALS 180 tablet 0  . Cholecalciferol (VITAMIN D-3 PO) Take 1 capsule by mouth daily.    . Cod Liver Oil OIL Take 1 capsule by mouth daily.    . furosemide (LASIX) 40 MG tablet Take 0.5 tablets (20 mg total) by mouth daily. 30 tablet 5  . HYDROcodone-acetaminophen (NORCO/VICODIN) 5-325 MG tablet Take 1 tablet by mouth every 4 (four) hours as needed for moderate pain or severe pain. 30 tablet 0  . lisinopril (PRINIVIL,ZESTRIL) 10 MG tablet TAKE 1 AND 1/2 TABLETS(15 MG) BY MOUTH DAILY (Patient taking differently: Take 15 mg by mouth nightly) 45 tablet 4  . Multiple Vitamins-Minerals (HAIR SKIN NAILS PO) Take 2 tablets by mouth daily.    . pantoprazole (PROTONIX) 40 MG tablet TAKE 1 TABLET BY MOUTH EVERY DAY 90 tablet 3  . Rivaroxaban (XARELTO) 15 MG TABS tablet Take 1 tablet (15 mg total) by mouth daily with supper. 30 tablet 2  . rosuvastatin (CRESTOR) 40 MG tablet Take 1 tablet (40 mg total) by mouth daily. 30 tablet 6  .  traMADol (ULTRAM) 50 MG tablet Take 25 mg by mouth every 6 (six) hours as needed for moderate pain (back pain).     No current facility-administered medications on file prior to visit.    Review of Systems:  .apser   Physical Examination: Filed Vitals:   08/28/15 1017  BP: 107/51  Pulse: 62  Temp: 97.5 F (36.4 C)   Filed Vitals:   08/28/15 1017  Height: 5\' 6"  (1.676 m)  Weight: 188 lb 3.2 oz (85.367 kg)   Body mass index is 30.39 kg/(m^2). Ideal Body Weight: Weight in (lb) to have BMI = 25: 154.6  GEN: WDWN, NAD, Non-toxic, A & O x 3, looks well HEENT: Atraumatic, Normocephalic. Neck supple. No masses, No LAD. Ears and Nose: No external deformity. CV: rate controlled atrial fib is present, No M/G/R. No JVD. No thrill. No extra heart sounds. PULM: CTA B, no wheezes, crackles, rhonchi. No retractions. No resp. distress. No accessory muscle use. EXTR: No c/c/e NEURO Normal gait.  PSYCH: Normally interactive. Conversant. Not depressed or anxious appearing.  Calm demeanor.   Assessment and Plan: Acute renal insufficiency - Plan: Basic metabolic panel  Anemia due to other cause - Plan: CBC  Persistent atrial fibrillation (HCC)  Follow-up labs today, will contact him with restults Happily his back pain is better A fib on xarelto and rate controlled, no symptoms (denies any lightheadedness or syncope)   Signed Lamar Blinks, MD  Call daughter with results- number is listed as pt cell  Received repeat labs.  Called and left message on daughter's cell phone. His creat and also hg are going in the right direction.  As long as he continues to feel well please come in for a lab visit only in 2 weeks.  I will place orders.  If any concerns or worsening however please let me know   Results for orders placed or performed in visit on 0000000  Basic metabolic panel  Result Value Ref Range   Sodium 141 135 - 145 mEq/L   Potassium 4.7 3.5 - 5.1 mEq/L   Chloride 104 96 - 112  mEq/L  CO2 30 19 - 32 mEq/L   Glucose, Bld 107 (H) 70 - 99 mg/dL   BUN 25 (H) 6 - 23 mg/dL   Creatinine, Ser 2.00 (H) 0.40 - 1.50 mg/dL   Calcium 9.4 8.4 - 10.5 mg/dL   GFR 34.22 (L) >60.00 mL/min  CBC  Result Value Ref Range   WBC 6.4 4.0 - 10.5 K/uL   RBC 4.02 (L) 4.22 - 5.81 Mil/uL   Platelets 223.0 150.0 - 400.0 K/uL   Hemoglobin 11.4 (L) 13.0 - 17.0 g/dL   HCT 35.1 (L) 39.0 - 52.0 %   MCV 87.3 78.0 - 100.0 fl   MCHC 32.5 30.0 - 36.0 g/dL   RDW 17.0 (H) 11.5 - 15.5 %

## 2015-08-28 NOTE — Patient Instructions (Addendum)
Decrease to 10 mg of lisinopril daily- your BP is a bit low today.    I will be in touch with your labs- I will call your daughter

## 2015-08-28 NOTE — Progress Notes (Signed)
Pre visit review using our clinic review tool, if applicable. No additional management support is needed unless otherwise documented below in the visit note. 

## 2015-08-29 ENCOUNTER — Encounter: Payer: Self-pay | Admitting: Family Medicine

## 2015-08-29 NOTE — Addendum Note (Signed)
Addended by: Lamar Blinks C on: 08/29/2015 04:55 PM   Modules accepted: Orders

## 2015-08-31 ENCOUNTER — Other Ambulatory Visit: Payer: Self-pay | Admitting: Cardiovascular Disease

## 2015-09-02 NOTE — Telephone Encounter (Signed)
Rx(s) sent to pharmacy electronically.  

## 2015-09-03 NOTE — Telephone Encounter (Signed)
Agree 

## 2015-09-11 ENCOUNTER — Other Ambulatory Visit (INDEPENDENT_AMBULATORY_CARE_PROVIDER_SITE_OTHER): Payer: Medicare Other

## 2015-09-11 DIAGNOSIS — N289 Disorder of kidney and ureter, unspecified: Secondary | ICD-10-CM

## 2015-09-11 DIAGNOSIS — D6489 Other specified anemias: Secondary | ICD-10-CM | POA: Diagnosis not present

## 2015-09-11 LAB — BASIC METABOLIC PANEL
BUN: 25 mg/dL — ABNORMAL HIGH (ref 6–23)
CO2: 30 mEq/L (ref 19–32)
Calcium: 8.7 mg/dL (ref 8.4–10.5)
Chloride: 102 mEq/L (ref 96–112)
Creatinine, Ser: 1.94 mg/dL — ABNORMAL HIGH (ref 0.40–1.50)
GFR: 35.44 mL/min — AB (ref >60.00)
Glucose, Bld: 136 mg/dL — ABNORMAL HIGH (ref 70–99)
POTASSIUM: 3.7 meq/L (ref 3.5–5.1)
SODIUM: 139 meq/L (ref 135–145)

## 2015-09-11 LAB — CBC
HEMATOCRIT: 32.5 % — AB (ref 39.0–52.0)
HEMOGLOBIN: 10.9 g/dL — AB (ref 13.0–17.0)
MCHC: 33.4 g/dL (ref 30.0–36.0)
MCV: 87.6 fl (ref 78.0–100.0)
PLATELETS: 227 10*3/uL (ref 150.0–400.0)
RBC: 3.71 Mil/uL — ABNORMAL LOW (ref 4.22–5.81)
RDW: 17.6 % — ABNORMAL HIGH (ref 11.5–15.5)
WBC: 6.5 10*3/uL (ref 4.0–10.5)

## 2015-09-12 ENCOUNTER — Other Ambulatory Visit: Payer: Self-pay | Admitting: Family Medicine

## 2015-09-12 DIAGNOSIS — N280 Ischemia and infarction of kidney: Secondary | ICD-10-CM

## 2015-09-12 NOTE — Progress Notes (Signed)
Seen a month ago following hospital stay mid May for renal infarct (clot), syncope and fall.  He was then noted to have a drop in his Hg of 3gm and was seen in the ER on 5/26. Recent follow-up labs:  Results for orders placed or performed in visit on 99991111  Basic metabolic panel  Result Value Ref Range   Sodium 139 135 - 145 mEq/L   Potassium 3.7 3.5 - 5.1 mEq/L   Chloride 102 96 - 112 mEq/L   CO2 30 19 - 32 mEq/L   Glucose, Bld 136 (H) 70 - 99 mg/dL   BUN 25 (H) 6 - 23 mg/dL   Creatinine, Ser 1.94 (H) 0.40 - 1.50 mg/dL   Calcium 8.7 8.4 - 10.5 mg/dL   GFR 35.44 (L) >60.00 mL/min  CBC  Result Value Ref Range   WBC 6.5 4.0 - 10.5 K/uL   RBC 3.71 (L) 4.22 - 5.81 Mil/uL   Platelets 227.0 150.0 - 400.0 K/uL   Hemoglobin 10.9 (L) 13.0 - 17.0 g/dL   HCT 32.5 (L) 39.0 - 52.0 %   MCV 87.6 78.0 - 100.0 fl   MCHC 33.4 30.0 - 36.0 g/dL   RDW 17.6 (H) 11.5 - 15.5 %   Creat was normal at 1.1 a year ago, max 2.28 recently and now slowly trending down again,  His Hg is stable.   He does take a low dose of lasix for CHF  Called and spoke with his daughter- he is overall doing well.  He does not have any complaints except for suspected plantar fascia pain and he plans to see his podiatrist.   They will come in for repeat labs in 1 month

## 2015-09-23 DIAGNOSIS — M79671 Pain in right foot: Secondary | ICD-10-CM | POA: Diagnosis not present

## 2015-09-23 DIAGNOSIS — M7731 Calcaneal spur, right foot: Secondary | ICD-10-CM | POA: Diagnosis not present

## 2015-09-23 DIAGNOSIS — M71572 Other bursitis, not elsewhere classified, left ankle and foot: Secondary | ICD-10-CM | POA: Diagnosis not present

## 2015-09-23 DIAGNOSIS — M7732 Calcaneal spur, left foot: Secondary | ICD-10-CM | POA: Diagnosis not present

## 2015-09-23 DIAGNOSIS — M79672 Pain in left foot: Secondary | ICD-10-CM | POA: Diagnosis not present

## 2015-09-23 DIAGNOSIS — M722 Plantar fascial fibromatosis: Secondary | ICD-10-CM | POA: Diagnosis not present

## 2015-09-23 DIAGNOSIS — M71571 Other bursitis, not elsewhere classified, right ankle and foot: Secondary | ICD-10-CM | POA: Diagnosis not present

## 2015-10-09 ENCOUNTER — Other Ambulatory Visit (INDEPENDENT_AMBULATORY_CARE_PROVIDER_SITE_OTHER): Payer: Medicare Other

## 2015-10-09 DIAGNOSIS — N280 Ischemia and infarction of kidney: Secondary | ICD-10-CM | POA: Diagnosis not present

## 2015-10-09 DIAGNOSIS — M722 Plantar fascial fibromatosis: Secondary | ICD-10-CM | POA: Diagnosis not present

## 2015-10-09 DIAGNOSIS — M71572 Other bursitis, not elsewhere classified, left ankle and foot: Secondary | ICD-10-CM | POA: Diagnosis not present

## 2015-10-09 DIAGNOSIS — M71571 Other bursitis, not elsewhere classified, right ankle and foot: Secondary | ICD-10-CM | POA: Diagnosis not present

## 2015-10-09 LAB — COMPREHENSIVE METABOLIC PANEL
ALK PHOS: 63 U/L (ref 39–117)
ALT: 10 U/L (ref 0–53)
AST: 15 U/L (ref 0–37)
Albumin: 3.8 g/dL (ref 3.5–5.2)
BUN: 24 mg/dL — ABNORMAL HIGH (ref 6–23)
CALCIUM: 9.3 mg/dL (ref 8.4–10.5)
CO2: 31 meq/L (ref 19–32)
Chloride: 104 mEq/L (ref 96–112)
Creatinine, Ser: 1.7 mg/dL — ABNORMAL HIGH (ref 0.40–1.50)
GFR: 41.27 mL/min — AB (ref >60.00)
GLUCOSE: 99 mg/dL (ref 70–99)
POTASSIUM: 4.6 meq/L (ref 3.5–5.1)
Sodium: 139 mEq/L (ref 135–145)
TOTAL PROTEIN: 7.4 g/dL (ref 6.0–8.3)
Total Bilirubin: 0.5 mg/dL (ref 0.2–1.2)

## 2015-10-09 LAB — CBC
HCT: 34 % — ABNORMAL LOW (ref 39.0–52.0)
Hemoglobin: 11.3 g/dL — ABNORMAL LOW (ref 13.0–17.0)
MCHC: 33.2 g/dL (ref 30.0–36.0)
MCV: 89 fl (ref 78.0–100.0)
Platelets: 223 10*3/uL (ref 150.0–400.0)
RBC: 3.82 Mil/uL — ABNORMAL LOW (ref 4.22–5.81)
RDW: 17.5 % — ABNORMAL HIGH (ref 11.5–15.5)
WBC: 7.7 10*3/uL (ref 4.0–10.5)

## 2015-10-10 ENCOUNTER — Encounter: Payer: Self-pay | Admitting: Family Medicine

## 2015-10-15 ENCOUNTER — Other Ambulatory Visit: Payer: Self-pay

## 2015-10-15 MED ORDER — FUROSEMIDE 40 MG PO TABS: 20.0000 mg | ORAL_TABLET | Freq: Every day | ORAL | 2 refills | Status: DC

## 2015-11-07 ENCOUNTER — Other Ambulatory Visit: Payer: Self-pay | Admitting: Family Medicine

## 2015-11-07 ENCOUNTER — Telehealth: Payer: Self-pay | Admitting: Family Medicine

## 2015-11-07 ENCOUNTER — Other Ambulatory Visit: Payer: Self-pay | Admitting: Cardiovascular Disease

## 2015-11-07 NOTE — Telephone Encounter (Signed)
Gregory [name] pt's daughter called in to request a Refill for traMADol.   Pharmacy: [place] and [address].

## 2015-11-27 ENCOUNTER — Ambulatory Visit (INDEPENDENT_AMBULATORY_CARE_PROVIDER_SITE_OTHER): Payer: Medicare Other | Admitting: Cardiovascular Disease

## 2015-11-27 ENCOUNTER — Encounter: Payer: Self-pay | Admitting: Cardiovascular Disease

## 2015-11-27 VITALS — BP 90/50 | HR 66 | Ht 66.5 in | Wt 193.5 lb

## 2015-11-27 DIAGNOSIS — N289 Disorder of kidney and ureter, unspecified: Secondary | ICD-10-CM | POA: Diagnosis not present

## 2015-11-27 DIAGNOSIS — I482 Chronic atrial fibrillation, unspecified: Secondary | ICD-10-CM

## 2015-11-27 DIAGNOSIS — I6522 Occlusion and stenosis of left carotid artery: Secondary | ICD-10-CM

## 2015-11-27 DIAGNOSIS — I25810 Atherosclerosis of coronary artery bypass graft(s) without angina pectoris: Secondary | ICD-10-CM | POA: Diagnosis not present

## 2015-11-27 DIAGNOSIS — I2510 Atherosclerotic heart disease of native coronary artery without angina pectoris: Secondary | ICD-10-CM

## 2015-11-27 DIAGNOSIS — Z7901 Long term (current) use of anticoagulants: Secondary | ICD-10-CM

## 2015-11-27 DIAGNOSIS — E785 Hyperlipidemia, unspecified: Secondary | ICD-10-CM

## 2015-11-27 DIAGNOSIS — I6529 Occlusion and stenosis of unspecified carotid artery: Secondary | ICD-10-CM

## 2015-11-27 NOTE — Patient Instructions (Signed)
Lab work this week ( cmet,lipid panel,cbc,tsh)   Schedule Carotid Dopplers    Your physician recommends that you schedule a follow-up appointment 3 months.

## 2015-11-28 LAB — LIPID PANEL
CHOLESTEROL: 134 mg/dL (ref 125–200)
HDL: 48 mg/dL (ref >=40)
LDL Cholesterol: 67 mg/dL (ref <130)
TRIGLYCERIDES: 97 mg/dL (ref <150)
Total CHOL/HDL Ratio: 2.8 Ratio (ref <=5.0)
VLDL: 19 mg/dL (ref <30)

## 2015-11-28 LAB — COMPREHENSIVE METABOLIC PANEL
ALBUMIN: 3.6 g/dL (ref 3.6–5.1)
ALK PHOS: 65 U/L (ref 40–115)
ALT: 10 U/L (ref 9–46)
AST: 16 U/L (ref 10–35)
BUN: 25 mg/dL (ref 7–25)
CHLORIDE: 104 mmol/L (ref 98–110)
CO2: 28 mmol/L (ref 20–31)
Calcium: 9 mg/dL (ref 8.6–10.3)
Creat: 2.04 mg/dL — ABNORMAL HIGH (ref 0.70–1.11)
Glucose, Bld: 86 mg/dL (ref 65–99)
POTASSIUM: 5.1 mmol/L (ref 3.5–5.3)
Sodium: 140 mmol/L (ref 135–146)
TOTAL PROTEIN: 6.6 g/dL (ref 6.1–8.1)
Total Bilirubin: 0.5 mg/dL (ref 0.2–1.2)

## 2015-11-28 LAB — CBC WITH DIFFERENTIAL/PLATELET
BASOS PCT: 0 %
Basophils Absolute: 0 cells/uL (ref 0–200)
EOS PCT: 3 %
Eosinophils Absolute: 225 cells/uL (ref 15–500)
HEMATOCRIT: 34.2 % — AB (ref 38.5–50.0)
HEMOGLOBIN: 11.5 g/dL — AB (ref 13.2–17.1)
LYMPHS ABS: 2850 {cells}/uL (ref 850–3900)
LYMPHS PCT: 38 %
MCH: 29.9 pg (ref 27.0–33.0)
MCHC: 33.6 g/dL (ref 32.0–36.0)
MCV: 88.8 fL (ref 80.0–100.0)
MONO ABS: 825 {cells}/uL (ref 200–950)
MPV: 9.6 fL (ref 7.5–12.5)
Monocytes Relative: 11 %
NEUTROS PCT: 48 %
Neutro Abs: 3600 cells/uL (ref 1500–7800)
Platelets: 207 10*3/uL (ref 140–400)
RBC: 3.85 MIL/uL — AB (ref 4.20–5.80)
RDW: 15.2 % — AB (ref 11.0–15.0)
WBC: 7.5 10*3/uL (ref 3.8–10.8)

## 2015-11-28 LAB — TSH: TSH: 4.22 mIU/L (ref 0.40–4.50)

## 2015-11-29 NOTE — Progress Notes (Signed)
Patient ID: Gregory [name], male   DOB: 1932/12/14, 82 y.o.   MRN: [id number]     HPI: Gregory [name] is a 82 y.o. male returns for a 10 month followup cardiology evaluation.  He is a former patient of Dr. Rollene Fare.    Gregory [name] has a history of known CAD in 2003 underwent CABG revascularization surgery by Dr. Servando Snare  (LIMA  to  LAD, vein graft to the obtuse marginal, sequential vein graft to the PDA and PLA of RCA).  His last cardiac catheterization was in 2008 done by Dr. Tami Ribas. At that time, ejection fraction was 20-25%. He declined consideration for ICD. Ejection fraction improved to approximately 35-40% in September 2012 on medical therapy. In the past, he has refused Coumadin therapy and had been maintained on aspirin and Plavix. In 2013 he was hospitalized for appendicitis with abscess requiring laparoscopic appendectomy. He was hospitalized in November 2014 and was felt to have a splenic infarct of embolic etiology. He underwent a transesophageal echocardiogram on 01/23/2013 by Dr. Sallyanne Kuster which revealed an ejection fraction in the range of 15-25%. There was diffuse hypokinesis with severe hypokinesis to akinesis of the anteroseptal apical myocardium. He had increased left ventricular end-diastolic filling pressure and elevated left atrial filling pressure. There was evidence for large solid fixed thrombus occupying the entire atrial appendage with moderate continuous spontaneous echo contrast ("smoke") in the left atrial cavity. There was no evidence for right atrial thrombus. At that time, the patient refused life vest.  He has been on Xarelto for anticoagulation.He denies chest pain. He denies significant shortness of breath. He is unaware of tachdysrhythmias.   He has permanent atrial fibrillation, but his rate has been controlled.  He denies any presyncope or syncope.  He denies any chest pain.  A followup echo Doppler study on 06/14/2013 revealed significant improvement in LV function  with an ejection fraction estimated now at 50-55% without regional wall motion abnormalities.  He did have mild aortic root dilatation, mild aortic insufficiency, and mild mitral regurgitation with biatrial enlargement.   He has severe bilateral carotid artery disease.  He had been seen by Dr. Oneida Alar.  His prior carotid duplex scan was significantly abnormal with his left carotid velocities increased to 789 systolic and 381 diastolic with severe fibrous plaque and elevated velocities within all segments of his left internal carotid.  There also was 50-69% diameter reduction in the right bulb/proximal ICA. I referred him to Dr. Gwenlyn Found who subsequently referred him to Dr. Trula Slade.  He denies chest pain.  He denies palpitations.  He denies shortness of breath.  He continues to say he is asymptomatic with reference to his carotid disease and has refused intervention.  When I saw him  Last yehe denied any change in symptomatology.  A follow-up Doppler study on 08/23/2014 showed increased velocities in the left ar  492 over 148 at the MICA level and to 30/67, at the PICA level.  On the right, his PICA velocity was 292/78.  When compared to his prior study.  This has not increased and has remained fairly stable.  He denies chest pain or palpitations.  He denies paresthesias.  He denies visual symptoms.  When I last saw him,  he remained asymptomatic.  He has refused to consider any potential carotid intervention or surgery.  He is unaware of any fast heartbeats with his chronic atrial fibrillation.  He denies bleeding.  He underwent a follow-up carotid study earlier today.  A preliminary report continues  to demonstrate severe stenosis with peak  velocity in the left MICA at 500/122 and PICA 241/34. On the right,PICA was 293/106.  At his last office visit, I recommended discontinuing of simvastatin in attempt to be more aggressive with lipid lowering and started Crestor 40 mg daily.  Since I last saw him, he was  hospitalized May after sustaining a renal infarct with acute kidney injury after taking 3 days off Xarelto for pain management injection to his back.  He had developed mild anemia with a hemoglobin drop of 3 points leading to a subsequent emergency room evaluation.  A CT of his abdomen and pelvis did not show acute findings or signs of peritoneal bleeding.  Socially, he has continued to be stable.  He denies any episodes of chest pain.  He denies any bleeding.  He denies paresthesias, presyncope or syncope.  An echo Doppler study in May 2017 showed an EF of 55-60%.  There was mild aortic insufficiency, mild aortic root dilatation, mitral annular calcification with mild MR.  He presents to the office for follow-up cardiologic evaluation.  Past Medical History:  Diagnosis Date  . Appendicitis with abscess 03/31/2011  . Arthritis   . CAD (coronary artery disease),CABG 1993-LIMA to the LAD, SVG to Om and SVG to PDA/PLA, patent on cath 2008. 2003   a. s/p CABG 2003. b. last cath 2008 with patent grafts.  . Carotid artery disease ([place])    a. Severe - pt refused intervention in the past.  . Chronic atrial fibrillation ([place])    permanent  . Chronic systolic CHF (congestive heart failure) ([place])    a. Prior low EF, later normalized.  . Essential hypertension   . GERD (gastroesophageal reflux disease)   . Heart attack ([place]) 1980's  . Heart murmur   . Hyperlipidemia   . Ischemic cardiomyopathy   . Kidney stones    "passed all but one time when he had to have lithotripsy" (01/21/2013)  . Pulmonary hypertension ([place])   . 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 07/2015 - after holding Xarelto x 3 days for spinal procedure.  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 SSS (sick sinus syndrome) ([place])   . Stroke [facility]) 2007   "slightly drags left foot since; recovered qthing else" (01/21/2013)  . Syncope 07/2015   a. felt vasovagal in setting of pain from renal infarct.    Past Surgical History:  Procedure Laterality Date  .  BALLOON DILATION N/A 05/22/2015   Procedure: BALLOON DILATION;  Surgeon: Gatha Mayer, MD;  Location: [facility] ENDOSCOPY;  Service: Endoscopy;  Laterality: N/A;  . CARDIAC CATHETERIZATION  02/24/2002   reduced LV function, 60-70% prox RCA stenosis, 70% PLA ostial stenosis, 80% secondary branch of PLA stenosis - subsequent CABG (Dr. Jackie Plum)  . Carotid Doppler  06/2012   50-69% right bulb/prox ICA diameter reduction; 70-99% left bulb/prox ICA diameter reduction  . CATARACT EXTRACTION W/ INTRAOCULAR LENS IMPLANT Bilateral 2013  . CORONARY ANGIOPLASTY  07/05/2006   3 vessel CAD, patent LIMA to LAD, patentVG to OM, patent SVG to PDA & PLA, mild MR, severe LV systolic dysfunction (Dr. Gerrie Nordmann)  . CORONARY ARTERY BYPASS GRAFT  03/01/2002   LIMA to LAD, reverse SVG to OM, reverse SVG to PDA of RCA, reverse SVG to PLA of RCA, ligation of LA appendage (Dr. Servando Snare)  . ESOPHAGOGASTRODUODENOSCOPY  02/01/2012   Procedure: ESOPHAGOGASTRODUODENOSCOPY (EGD);  Surgeon: Beryle Beams, MD;  Location: [facility];  Service: Endoscopy;  Laterality: N/A;  .  ESOPHAGOGASTRODUODENOSCOPY N/A 05/22/2015   Procedure: ESOPHAGOGASTRODUODENOSCOPY (EGD);  Surgeon: Gatha Mayer, MD;  Location: [facility];  Service: Endoscopy;  Laterality: N/A;  . LAPAROSCOPIC APPENDECTOMY  03/30/2011   Procedure: APPENDECTOMY LAPAROSCOPIC;  Surgeon: Joyice Faster. Cornett, MD;  Location: [facility];  Service: General;  Laterality: N/A;  . LEFT HEART CATHETERIZATION WITH CORONARY ANGIOGRAM N/A 01/24/2013   Procedure: LEFT HEART CATHETERIZATION WITH CORONARY ANGIOGRAM;  Surgeon: [name], MD;  Location: [facility] CATH LAB;  Service: Cardiovascular;  Laterality: N/A;  Right heart with grafts  . LITHOTRIPSY     "once" (01/21/2013)  . NM MYOCAR PERF WALL MOTION  05/2009   persantine myoview - fixed moderate perfusion defect in inferior wall & lateral segment of apex (poor non-transmural infarction), minimal anterolateral periinfarct reversible ischemia seen,  abnormal study, defects similar to 2006 study  . SPLENECTOMY, TOTAL  01/2013  . TEE WITHOUT CARDIOVERSION N/A 01/23/2013   Procedure: TRANSESOPHAGEAL ECHOCARDIOGRAM (TEE);  Surgeon: Sanda Klein, MD;  Location: [facility] ENDOSCOPY;  Service: Cardiovascular;  Laterality: N/A;  . TRANSTHORACIC ECHOCARDIOGRAM  06/23/2012   EF 40-45%, mild LVH; mild AV regurg; mild MV regurg; LV mod-severely dilated; RV mildly dilated; systolic pressure borderline increased; RA mod dilated    Allergies  Allergen Reactions  . Percocet [Oxycodone-Acetaminophen] Itching and Nausea Only    Current Outpatient Prescriptions  Medication Sig Dispense Refill  . aspirin EC 81 MG tablet Take 81 mg by mouth daily.    . carvedilol (COREG) 3.125 MG tablet TAKE 3 TABLETS BY MOUTH TWICE DAILY WITH MEALS 180 tablet 2  . furosemide (LASIX) 40 MG tablet Take 0.5 tablets (20 mg total) by mouth daily. 30 tablet 2  . lisinopril (PRINIVIL,ZESTRIL) 10 MG tablet TAKE 1 AND 1/2 TABLETS(15 MG) BY MOUTH DAILY (Patient taking differently: Take 15 mg by mouth nightly) 45 tablet 4  . pantoprazole (PROTONIX) 40 MG tablet TAKE 1 TABLET BY MOUTH EVERY DAY 90 tablet 3  . Rivaroxaban (XARELTO) 15 MG TABS tablet Take 1 tablet (15 mg total) by mouth daily with supper. 30 tablet 2  . rosuvastatin (CRESTOR) 40 MG tablet TAKE 1 TABLET(40 MG) BY MOUTH DAILY 30 tablet 0  . traMADol (ULTRAM) 50 MG tablet TAKE 1/2 TABLET BY MOUTH EVERY 8 HOURS AS NEEDED FOR PAIN 30 tablet 0   No current facility-administered medications for this visit.     Social History   Social History  . Marital status: Married    Spouse name: N/A  . Number of children: 76  . Years of education: 8   Occupational History  . retired    Social History Main Topics  . Smoking status: Former Smoker    Packs/day: 2.00    Years: 20.00    Types: Cigarettes    Quit date: 04/08/1986  . Smokeless tobacco: Current User    Types: Chew  . Alcohol use No  . Drug use: No  . Sexual  activity: Not on file   Other Topics Concern  . Not on file   Social History Narrative   A she is married, 5 sons 4 daughters. He is retired Horticulturist, commercial. 2-3 caffeinated beverages a day no alcohol    Family History  Problem Relation Age of Onset  . Heart disease Father     rheumatic fever  . Heart attack Brother 59  . Stroke Mother   . Stroke Brother 68   ROS General: Negative; No fevers, chills, or night sweats;  HEENT: Negative; No changes in vision or  hearing, sinus congestion, difficulty swallowing Pulmonary: Negative; No cough, wheezing, shortness of breath, hemoptysis Cardiovascular: See history of present illness GI: positive for GERD; No nausea, vomiting, diarrhea, or abdominal pain GU: Negative; No dysuria, hematuria, or difficulty voiding Musculoskeletal: Negative; no myalgias, joint pain, or weakness Hematologic/Oncology: Negative; no easy bruising, bleeding Endocrine: Negative; no heat/cold intolerance; no diabetes Neuro: positive for remote CVA Skin: Negative; No rashes or skin lesions Psychiatric: Negative; No behavioral problems, depression Sleep: Negative; No snoring, daytime sleepiness, hypersomnolence, bruxism, restless legs, hypnogognic hallucinations, no cataplexy Other comprehensive 14 point system review is negative.   PE BP (!) 90/50 (BP Location: Left Arm, Patient Position: Sitting, Cuff Size: Normal)   Pulse 66   Ht 5' 6.5" (1.689 m)   Wt 193 lb 8 oz (87.8 kg)   BMI 30.76 kg/m    Repeat blood pressure was 102/60  Wt Readings from Last 3 Encounters:  11/27/15 193 lb 8 oz (87.8 kg)  08/28/15 188 lb 3.2 oz (85.4 kg)  08/15/15 194 lb 3.2 oz (88.1 kg)   General: Alert, oriented, no distress.   Skin: normal turgor, no rashes HEENT: Normocephalic, atraumatic. Pupils round and reactive; sclera anicteric;no lid lag.  Nose without nasal septal hypertrophy Mouth/Parynx benign; Mallinpatti scale 3 Neck: No JVD, bilateral carotid bruits with slightly  delayed upstroke. Lungs: clear to ausculatation and percussion; no wheezing or rales Chest wall: no tenderness to palpitation Heart: Irregularly irregular rhythm compatible with atrial fibrillation with a controlled ventricular response, s1 s2 normal 2/6 systolic murmur. No S3 gallop.  No diastolic murmur.  No rubs, thrills or heaves to Abdomen: soft, nontender; no hepatosplenomehaly, BS+; abdominal aorta nontender and not dilated by palpation. Back: no CVA tenderness Pulses 2+ Extremities: no clubbing cyanosis or edema, Homan's sign negative  Neurologic: grossly nonfocal Psychologic: normal affect and mood.  ECG (independently read by me): Atrial fibrillation with premature ventricular beats.  Poor R wave progression anteriorly.  Small nondiagnostic inferior Q waves.  December 2016 ECG (independently read by me): atrial fibrillation with rate in the 60s with occasional PVC.  QTc interval 452 ms  August 2016 ECG (independently read by me): Atrial fibrillation at 67 bpm.  2 ventricular premature beats.  Poor R-wave progression  May 2016 ECG (independently read by me): Atrial fibrillation with occasional PVCs.  Heart rate 72.  ECG (independently read by me): Atrial fibrillation with controlled ventricular response in the 70s.  Poor anterior R-wave progression.  Prior April 2015 ECG (independently read by me): Atrial fibrillation at 56 beats per minute.  QTc interval 395 ms  Prior 02/23/2013 ECG: Atrial fibrillation at 80 beats per minute with isolated PVC.  LABS: BMP Latest Ref Rng & Units 11/27/2015 10/09/2015 09/11/2015  Glucose 65 - 99 mg/dL 86 99 136(H)  BUN 7 - 25 mg/dL 25 24(H) 25(H)  Creatinine 0.70 - 1.11 mg/dL 2.04(H) 1.70(H) 1.94(H)  Sodium 135 - 146 mmol/L 140 139 139  Potassium 3.5 - 5.3 mmol/L 5.1 4.6 3.7  Chloride 98 - 110 mmol/L 104 104 102  CO2 20 - 31 mmol/L '28 31 30  '$ Calcium 8.6 - 10.3 mg/dL 9.0 9.3 8.7   Hepatic Function Latest Ref Rng & Units 11/27/2015 10/09/2015  08/09/2015  Total Protein 6.1 - 8.1 g/dL 6.6 7.4 7.1  Albumin 3.6 - 5.1 g/dL 3.6 3.8 2.7(L)  AST 10 - 35 U/L '16 15 28  '$ ALT 9 - 46 U/L 10 10 35  Alk Phosphatase 40 - 115 U/L 65 63 101  Total  Bilirubin 0.2 - 1.2 mg/dL 0.5 0.5 0.3  Bilirubin, Direct 0.0 - 0.3 mg/dL - - -   CBC Latest Ref Rng & Units 11/27/2015 10/09/2015 09/11/2015  WBC 3.8 - 10.8 K/uL 7.5 7.7 6.5  Hemoglobin 13.2 - 17.1 g/dL 11.5(L) 11.3(L) 10.9(L)  Hematocrit 38.5 - 50.0 % 34.2(L) 34.0(L) 32.5(L)  Platelets 140 - 400 K/uL 207 223.0 227.0   Lab Results  Component Value Date   MCV 88.8 11/27/2015   MCV 89.0 10/09/2015   MCV 87.6 09/11/2015   Lab Results  Component Value Date   TSH 4.22 11/27/2015   Lab Results  Component Value Date   HGBA1C 6.0 (H) 07/13/2013   Lipid Panel     Component Value Date/Time   CHOL 134 11/27/2015 0830   TRIG 97 11/27/2015 0830   HDL 48 11/27/2015 0830   CHOLHDL 2.8 11/27/2015 0830   VLDL 19 11/27/2015 0830   LDLCALC 67 11/27/2015 0830    ASSESSMENT AND PLAN: Mr. Ramey Schiff is a 82 year old gentleman who is status post CABG revascularization surgery in 2003. He has a history of an  ischemic cardiomyopathy with an initial ejection fraction of 25% with subsequent improvement to approximately 35-40%.  His most recent echo Doppler study, EF has now normalized and was reported at 55-60%.  He has a remote history of a small right brain CVA in 2005  with subsequent recovery. 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 He has previously documented extensive thrombus in his left atrium and is on anticoagulation therapy with Xarelto 20 mg daily which needs to be lifelong.  When his Xarelto was held for an injection for pain management to the back.  He sustained a renal infarct with transient acute kidney insufficiency.  Resolute.  He denies any awareness of heme in stool or bleeding.  Again I had a long discussion with him concerning  his previous documentation of severe carotid disease.  He now is potential he didn't to reconsider his previous denial against treatment of his carotid stenosis.  As result, I will schedule him for follow-up duplex scan for further evaluation.  I am also checking repeat blood work in the fasting state.  Blood pressure today remains stable.  He has tolerated change to Crestor from simvastatin for more aggressive lipid-lowering with target LDL less than 70.  He is not having any chest pain.  I will see him in the office 2-3 months for reevaluation.    Time spent: 25 minutes  Troy Sine, MD, [facility]  11/29/2015 9:43 PM

## 2015-12-02 ENCOUNTER — Ambulatory Visit (HOSPITAL_COMMUNITY)
Admission: RE | Admit: 2015-12-02 | Discharge: 2015-12-02 | Disposition: A | Discharge status: Home / Self Care | Payer: Medicare Other | Source: Ambulatory Visit | Attending: Cardiovascular Disease | Admitting: Cardiovascular Disease

## 2015-12-02 DIAGNOSIS — I25810 Atherosclerosis of coronary artery bypass graft(s) without angina pectoris: Secondary | ICD-10-CM | POA: Diagnosis not present

## 2015-12-02 DIAGNOSIS — I6522 Occlusion and stenosis of left carotid artery: Secondary | ICD-10-CM

## 2015-12-02 DIAGNOSIS — I6523 Occlusion and stenosis of bilateral carotid arteries: Secondary | ICD-10-CM | POA: Diagnosis not present

## 2015-12-04 ENCOUNTER — Other Ambulatory Visit: Payer: Self-pay | Admitting: Emergency Medicine

## 2015-12-04 ENCOUNTER — Other Ambulatory Visit: Payer: Self-pay | Admitting: Family Medicine

## 2015-12-04 ENCOUNTER — Ambulatory Visit: Payer: Medicare Other

## 2015-12-04 ENCOUNTER — Other Ambulatory Visit (INDEPENDENT_AMBULATORY_CARE_PROVIDER_SITE_OTHER): Payer: Medicare Other

## 2015-12-04 DIAGNOSIS — N289 Disorder of kidney and ureter, unspecified: Secondary | ICD-10-CM | POA: Diagnosis not present

## 2015-12-04 DIAGNOSIS — Z23 Encounter for immunization: Secondary | ICD-10-CM | POA: Diagnosis not present

## 2015-12-04 LAB — BASIC METABOLIC PANEL
BUN: 35 mg/dL — AB (ref 6–23)
CO2: 30 meq/L (ref 19–32)
Calcium: 8.8 mg/dL (ref 8.4–10.5)
Chloride: 104 mEq/L (ref 96–112)
Creatinine, Ser: 2.08 mg/dL — ABNORMAL HIGH (ref 0.40–1.50)
GFR: 32.69 mL/min — AB (ref >60.00)
GLUCOSE: 100 mg/dL — AB (ref 70–99)
POTASSIUM: 4.3 meq/L (ref 3.5–5.1)
SODIUM: 139 meq/L (ref 135–145)

## 2015-12-07 ENCOUNTER — Telehealth: Payer: Self-pay | Admitting: Family Medicine

## 2015-12-07 ENCOUNTER — Encounter: Payer: Self-pay | Admitting: Family Medicine

## 2015-12-07 DIAGNOSIS — N189 Chronic kidney disease, unspecified: Secondary | ICD-10-CM

## 2015-12-07 NOTE — Telephone Encounter (Signed)
Called and spoke with his daughter- her number is listed as pt cell.  Gregory [name]'s creat is staying at about 2 at his time- this is likely a long term condition.  He has not seen nephrology since getting out of the hospital in May so I will refer him.  Will also ask Dr. Claiborne Billings if we can decrease or stop his lasix.   If their nephrology appt is more than 8 weeks in the future we will monitor his BMP in the interim. She states understanding and will alert me if she does not hear about his nephrology appt

## 2015-12-10 ENCOUNTER — Other Ambulatory Visit: Payer: Self-pay | Admitting: Cardiovascular Disease

## 2015-12-11 ENCOUNTER — Other Ambulatory Visit: Payer: Self-pay | Admitting: Family Medicine

## 2015-12-11 NOTE — Telephone Encounter (Signed)
Rx(s) sent to pharmacy electronically.  

## 2015-12-12 ENCOUNTER — Telehealth: Payer: Self-pay | Admitting: Cardiovascular Disease

## 2015-12-12 MED ORDER — CARVEDILOL 3.125 MG PO TABS: 3.1250 mg | ORAL_TABLET | Freq: Two times a day (BID) | ORAL | 12 refills | Status: DC

## 2015-12-12 NOTE — Telephone Encounter (Signed)
Refill sent to the pharmacy electronically.  

## 2015-12-12 NOTE — Telephone Encounter (Signed)
New message   Pt dtr calling about a refill.     *STAT* If patient is at the pharmacy, call can be transferred to refill team.   1. Which medications need to be refilled? (please list name of each medication and dose if known) carvedilol  2. Which pharmacy/location (including street and city if local pharmacy) is medication to be sent to? [facility] [address]  3. Do they need a 30 day or 90 day supply? 30 day

## 2015-12-14 ENCOUNTER — Telehealth: Payer: Self-pay | Admitting: Family Medicine

## 2015-12-14 NOTE — Telephone Encounter (Signed)
Received note from nephrology suggesting that we DC his lisinopril.  Message to his cardiologist to make sure this is ok with him first

## 2015-12-24 ENCOUNTER — Other Ambulatory Visit: Payer: Self-pay | Admitting: Cardiovascular Disease

## 2015-12-24 ENCOUNTER — Other Ambulatory Visit: Payer: Self-pay | Admitting: Family Medicine

## 2015-12-24 ENCOUNTER — Telehealth: Payer: Self-pay | Admitting: Family Medicine

## 2015-12-24 NOTE — Telephone Encounter (Signed)
Patient's daughter is calling requesting a refill of patient's traMADol (ULTRAM) 50 MG tablet   Caller: [name] Patient Relation:Daughter Caller Phone:[phone] Pharmacy:[facility] [phone] - [place], [place] - [address] AT [address]

## 2015-12-25 ENCOUNTER — Encounter: Payer: Self-pay | Admitting: Physician Assistant

## 2015-12-25 NOTE — Progress Notes (Signed)
Received message from PCP Dr. Lorelei Pont that nephrology wants to stop ACEI, asking if OK with [place]. I have approved for this to be held (esp with recommendation coming from nephrology) - recommended she have patient keep close eye on BP. But with BP recently running 90s/60s, do not suspect HTN will become an acute issue after holding this med. Dayna Dunn PA-C

## 2015-12-25 NOTE — Telephone Encounter (Signed)
Received request for tramadol- refilled for him today. Called his daughter [name] and [facility]- refill done. Also, please have her dad hold his lisinopril pending nephrology appt.  This was advised by Dr. Moshe Cipro and I just heard back from cardiology - this is ok from their standpoint as well.

## 2015-12-25 NOTE — Telephone Encounter (Signed)
Done today.

## 2015-12-26 ENCOUNTER — Telehealth: Payer: Self-pay | Admitting: *Deleted

## 2015-12-26 NOTE — Telephone Encounter (Signed)
Left message to return a call to discuss results.

## 2015-12-26 NOTE — Telephone Encounter (Signed)
-----   Message from Troy Sine, MD sent at 12/22/2015  9:00 PM EDT ----- Labs ok x renal insuf is worse; decrease lisinopril from 15 to 5 mg and re-check Bmet n 2 weeks

## 2015-12-27 ENCOUNTER — Telehealth: Payer: Self-pay | Admitting: Cardiovascular Disease

## 2015-12-27 NOTE — Telephone Encounter (Signed)
New message ° ° ° ° °Returning a call to the nurse to get lab results °

## 2015-12-27 NOTE — Telephone Encounter (Signed)
Spoke to daughter/DPR concerning results & recommendations based on last labwork. She notes Dr. Lorelei Pont has already taken pt off lisinopril completely and pt is to see a kidney specialist on 10/24.  Advised to follow up then to see what nephrology will want to order. Daughter aware to call us if further needs identified.

## 2016-01-05 NOTE — Progress Notes (Signed)
Ok to stop ACE-i

## 2016-01-07 DIAGNOSIS — N2581 Secondary hyperparathyroidism of renal origin: Secondary | ICD-10-CM | POA: Diagnosis not present

## 2016-01-07 DIAGNOSIS — N183 Chronic kidney disease, stage 3 (moderate): Secondary | ICD-10-CM | POA: Diagnosis not present

## 2016-01-07 DIAGNOSIS — I10 Essential (primary) hypertension: Secondary | ICD-10-CM | POA: Diagnosis not present

## 2016-01-07 DIAGNOSIS — D631 Anemia in chronic kidney disease: Secondary | ICD-10-CM | POA: Diagnosis not present

## 2016-01-08 ENCOUNTER — Other Ambulatory Visit: Payer: Self-pay | Admitting: Nephrology

## 2016-01-08 DIAGNOSIS — N183 Chronic kidney disease, stage 3 unspecified: Secondary | ICD-10-CM

## 2016-01-10 ENCOUNTER — Other Ambulatory Visit: Payer: Self-pay | Admitting: Cardiovascular Disease

## 2016-01-13 ENCOUNTER — Encounter: Payer: Self-pay | Admitting: Family Medicine

## 2016-01-13 DIAGNOSIS — D649 Anemia, unspecified: Secondary | ICD-10-CM | POA: Insufficient documentation

## 2016-01-16 ENCOUNTER — Other Ambulatory Visit: Payer: Self-pay | Admitting: Cardiovascular Disease

## 2016-01-16 ENCOUNTER — Telehealth: Payer: Self-pay | Admitting: *Deleted

## 2016-01-16 ENCOUNTER — Telehealth: Payer: Self-pay | Admitting: Cardiovascular Disease

## 2016-01-16 ENCOUNTER — Other Ambulatory Visit: Payer: Self-pay | Admitting: *Deleted

## 2016-01-16 DIAGNOSIS — N183 Chronic kidney disease, stage 3 unspecified: Secondary | ICD-10-CM

## 2016-01-16 MED ORDER — RIVAROXABAN 15 MG PO TABS: 15.0000 mg | ORAL_TABLET | Freq: Every day | ORAL | 5 refills | Status: DC

## 2016-01-16 MED ORDER — LISINOPRIL 10 MG PO TABS: 5.0000 mg | ORAL_TABLET | Freq: Every day | ORAL | 6 refills | Status: DC

## 2016-01-16 NOTE — Telephone Encounter (Signed)
Pharmacy would not refill his Xarelto.  Patient's daughter is calling wondering why

## 2016-01-16 NOTE — Telephone Encounter (Signed)
Notified patient's wife of lab results and recommendations.

## 2016-01-17 ENCOUNTER — Telehealth: Payer: Self-pay | Admitting: Cardiovascular Disease

## 2016-01-17 ENCOUNTER — Other Ambulatory Visit: Payer: Self-pay | Admitting: Cardiovascular Disease

## 2016-01-17 NOTE — Telephone Encounter (Signed)
New message  Pt's daughter called about medication, pharm still hasn't received RX  Lisinopril 10mg   [facility] [address], [place]  Please call and confirm

## 2016-01-17 NOTE — Telephone Encounter (Signed)
Received records from [facility] for appointment on 02/26/16 with Dr Claiborne Billings.  Records given to [facility] for Dr Evette Georges Schedule on 02/26/16. lp

## 2016-01-22 ENCOUNTER — Ambulatory Visit
Admission: RE | Admit: 2016-01-22 | Discharge: 2016-01-22 | Disposition: A | Discharge status: Home / Self Care | Payer: Medicare Other | Source: Ambulatory Visit | Attending: Nephrology | Admitting: Nephrology

## 2016-01-22 DIAGNOSIS — N183 Chronic kidney disease, stage 3 unspecified: Secondary | ICD-10-CM

## 2016-01-24 DIAGNOSIS — N183 Chronic kidney disease, stage 3 (moderate): Secondary | ICD-10-CM | POA: Diagnosis not present

## 2016-01-29 DIAGNOSIS — N183 Chronic kidney disease, stage 3 (moderate): Secondary | ICD-10-CM | POA: Diagnosis not present

## 2016-01-30 LAB — BASIC METABOLIC PANEL
BUN: 21 mg/dL (ref 7–25)
CALCIUM: 9 mg/dL (ref 8.6–10.3)
CHLORIDE: 103 mmol/L (ref 98–110)
CO2: 30 mmol/L (ref 20–31)
CREATININE: 1.62 mg/dL — AB (ref 0.70–1.11)
GLUCOSE: 97 mg/dL (ref 65–99)
POTASSIUM: 4.4 mmol/L (ref 3.5–5.3)
SODIUM: 139 mmol/L (ref 135–146)

## 2016-02-21 ENCOUNTER — Other Ambulatory Visit: Payer: Self-pay | Admitting: Family Medicine

## 2016-02-21 MED ORDER — TRAMADOL HCL 50 MG PO TABS: ORAL_TABLET | ORAL | 0 refills | Status: DC

## 2016-02-21 NOTE — Telephone Encounter (Signed)
Patient's daughter is calling requesting a refill of patient's traMADol (ULTRAM) 50 MG tablet  Please advise  Pharmacy: [facility] [place], [place] - [address] AT [place]

## 2016-02-26 ENCOUNTER — Ambulatory Visit (INDEPENDENT_AMBULATORY_CARE_PROVIDER_SITE_OTHER): Payer: Medicare Other | Admitting: Cardiovascular Disease

## 2016-02-26 ENCOUNTER — Encounter: Payer: Self-pay | Admitting: Cardiovascular Disease

## 2016-02-26 VITALS — BP 102/54 | HR 58 | Ht 66.5 in | Wt 194.2 lb

## 2016-02-26 DIAGNOSIS — N289 Disorder of kidney and ureter, unspecified: Secondary | ICD-10-CM

## 2016-02-26 DIAGNOSIS — I2510 Atherosclerotic heart disease of native coronary artery without angina pectoris: Secondary | ICD-10-CM

## 2016-02-26 DIAGNOSIS — Z8673 Personal history of transient ischemic attack (TIA), and cerebral infarction without residual deficits: Secondary | ICD-10-CM

## 2016-02-26 DIAGNOSIS — I482 Chronic atrial fibrillation, unspecified: Secondary | ICD-10-CM

## 2016-02-26 DIAGNOSIS — I779 Disorder of arteries and arterioles, unspecified: Secondary | ICD-10-CM

## 2016-02-26 DIAGNOSIS — Z7901 Long term (current) use of anticoagulants: Secondary | ICD-10-CM

## 2016-02-26 DIAGNOSIS — N280 Ischemia and infarction of kidney: Secondary | ICD-10-CM

## 2016-02-26 DIAGNOSIS — I739 Peripheral vascular disease, unspecified: Secondary | ICD-10-CM

## 2016-02-26 DIAGNOSIS — I25810 Atherosclerosis of coronary artery bypass graft(s) without angina pectoris: Secondary | ICD-10-CM

## 2016-02-26 DIAGNOSIS — N183 Chronic kidney disease, stage 3 (moderate): Secondary | ICD-10-CM | POA: Diagnosis not present

## 2016-02-26 NOTE — Patient Instructions (Signed)
Your physician wants you to follow-up in: 6 months or sooner if needed. You will receive a reminder letter in the mail two months in advance. If you don't receive a letter, please call our office to schedule the follow-up appointment.   If you need a refill on your cardiac medications before your next appointment, please call your pharmacy. 

## 2016-02-28 NOTE — Progress Notes (Signed)
Patient ID: Gregory [name], male   DOB: 02-Sep-1932, 82 y.o.   MRN: [id number]     HPI: Gregory [name] is a 82 y.o. male returns for a 10 month followup cardiology evaluation.  He is a former patient of Dr. Rollene Fare.    Gregory [name] has a history of known CAD in 2003 underwent CABG revascularization surgery by Dr. Servando Snare  (LIMA  to  LAD, vein graft to the obtuse marginal, sequential vein graft to the PDA and PLA of RCA).  His last cardiac catheterization was in 2008 done by Dr. Tami Ribas. At that time, ejection fraction was 20-25%. He declined consideration for ICD. Ejection fraction improved to approximately 35-40% in September 2012 on medical therapy. In the past, he has refused Coumadin therapy and had been maintained on aspirin and Plavix. In 2013 he was hospitalized for appendicitis with abscess requiring laparoscopic appendectomy. He was hospitalized in November 2014 and was felt to have a splenic infarct of embolic etiology. He underwent a transesophageal echocardiogram on 01/23/2013 by Dr. Sallyanne Kuster which revealed an ejection fraction in the range of 15-25%. There was diffuse hypokinesis with severe hypokinesis to akinesis of the anteroseptal apical myocardium. He had increased left ventricular end-diastolic filling pressure and elevated left atrial filling pressure. There was evidence for large solid fixed thrombus occupying the entire atrial appendage with moderate continuous spontaneous echo contrast ("smoke") in the left atrial cavity. There was no evidence for right atrial thrombus. At that time, the patient refused life vest.  He has been on Xarelto for anticoagulation.He denies chest pain. He denies significant shortness of breath. He is unaware of tachdysrhythmias.   He has permanent atrial fibrillation, but his rate has been controlled.  He denies any presyncope or syncope.  He denies any chest pain.  A followup echo Doppler study on 06/14/2013 revealed significant improvement in LV function  with an ejection fraction estimated now at 50-55% without regional wall motion abnormalities.  He did have mild aortic root dilatation, mild aortic insufficiency, and mild mitral regurgitation with biatrial enlargement.   He has severe bilateral carotid artery disease.  He had been seen by Dr. Oneida Alar.  His prior carotid duplex scan was significantly abnormal with his left carotid velocities increased to 323 systolic and 557 diastolic with severe fibrous plaque and elevated velocities within all segments of his left internal carotid.  There also was 50-69% diameter reduction in the right bulb/proximal ICA. I referred him to Dr. Gwenlyn Found who subsequently referred him to Dr. Trula Slade.  He denies chest pain.  He denies palpitations.  He denies shortness of breath.  He continues to say he is asymptomatic with reference to his carotid disease and has refused intervention.  When I saw him  Last yehe denied any change in symptomatology.  A follow-up Doppler study on 08/23/2014 showed increased velocities in the left ar  492 over 148 at the MICA level and to 30/67, at the PICA level.  On the right, his PICA velocity was 292/78.  When compared to his prior study.  This has not increased and has remained fairly stable.  He denies chest pain or palpitations.  He denies paresthesias.  He denies visual symptoms.  When I saw him in follow-up,  he remained asymptomatic.  He has refused to consider any potential carotid intervention or surgery.  He was unaware of any fast heartbeats with his chronic atrial fibrillation.  He denies bleeding.  A carotid study earlier today continued to demonstrate severe stenosis with peak  velocity in the left MICA at 500/122 and PICA 241/34. On the right,PICA was 293/106.  At his last office visit, I recommended discontinuing of simvastatin in attempt to be more aggressive with lipid lowering and started Crestor 40 mg daily.  He was hospitalized May 2017  after sustaining a renal infarct with  acute kidney injury after taking 3 days off Xarelto for pain management injection to his back.  He had developed mild anemia with a hemoglobin drop of 3 points leading to a subsequent emergency room evaluation.  A CT of his abdomen and pelvis did not show acute findings or signs of peritoneal bleeding.  He denied any episodes of chest pain,  paresthesias, presyncope or syncope. Or bleeding  An echo Doppler study in May 2017 showed an EF of 55-60%.  There was mild aortic insufficiency, mild aortic root dilatation, mitral annular calcification with mild MR.   When I last saw him in the office, had begun to consider the possibility of maybe in the future thinking about carotid surgery, but he was not ready yet to have this done.  His last carotid study in September 2017 showed left carotid peak systolic velocity of 570 and peak diastolic velocity 177, consistent with a least 80% stenosis.  On the right peak systolic velocity was 939 and diastolic velocity was 85.  He is followed by Dr. Posey Pronto for his chronic kidney disease and just had blood work done in his office.  I do not have these results.  Presently, he has been taking carvedilol 9.375 mg twice a day, and he had been off lisinopril but this has been restarted by Dr. Posey Pronto and he currently is taking 10 mg daily.  He takes Lasix 20 mg.  He continues to be on Xarelto 15 mg for anticoagulation.  He is on high potency statin with Crestor 40 mg.  He also is taking aspirin.  He presents for evaluation.  Past Medical History:  Diagnosis Date  . Appendicitis with abscess 03/31/2011  . Arthritis   . CAD (coronary artery disease),CABG 1993-LIMA to the LAD, SVG to Om and SVG to PDA/PLA, patent on cath 2008. 2003   a. s/p CABG 2003. b. last cath 2008 with patent grafts.  . Carotid artery disease ([place])    a. Severe - pt refused intervention in the past.  . Chronic atrial fibrillation ([place])    permanent  . Chronic systolic CHF (congestive heart failure) ([place])     a. Prior low EF, later normalized.  . Essential hypertension   . GERD (gastroesophageal reflux disease)   . Heart attack 1980's  . Heart murmur   . Hyperlipidemia   . Ischemic cardiomyopathy   . Kidney stones    "passed all but one time when he had to have lithotripsy" (01/21/2013)  . Pulmonary hypertension   . 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 07/2015 - after holding Xarelto x 3 days for spinal procedure.  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 SSS (sick sinus syndrome) ([place])   . Stroke [facility]) 2007   "slightly drags left foot since; recovered qthing else" (01/21/2013)  . Syncope 07/2015   a. felt vasovagal in setting of pain from renal infarct.    Past Surgical History:  Procedure Laterality Date  . BALLOON DILATION N/A 05/22/2015   Procedure: BALLOON DILATION;  Surgeon: Gatha Mayer, MD;  Location: [facility] ENDOSCOPY;  Service: Endoscopy;  Laterality: N/A;  . CARDIAC CATHETERIZATION  02/24/2002   reduced LV function, 60-70% prox RCA  stenosis, 70% PLA ostial stenosis, 80% secondary branch of PLA stenosis - subsequent CABG (Dr. Jackie Plum)  . Carotid Doppler  06/2012   50-69% right bulb/prox ICA diameter reduction; 70-99% left bulb/prox ICA diameter reduction  . CATARACT EXTRACTION W/ INTRAOCULAR LENS IMPLANT Bilateral 2013  . CORONARY ANGIOPLASTY  07/05/2006   3 vessel CAD, patent LIMA to LAD, patentVG to OM, patent SVG to PDA & PLA, mild MR, severe LV systolic dysfunction (Dr. Gerrie Nordmann)  . CORONARY ARTERY BYPASS GRAFT  03/01/2002   LIMA to LAD, reverse SVG to OM, reverse SVG to PDA of RCA, reverse SVG to PLA of RCA, ligation of LA appendage (Dr. Servando Snare)  . ESOPHAGOGASTRODUODENOSCOPY  02/01/2012   Procedure: ESOPHAGOGASTRODUODENOSCOPY (EGD);  Surgeon: Beryle Beams, MD;  Location: [facility];  Service: Endoscopy;  Laterality: N/A;  . ESOPHAGOGASTRODUODENOSCOPY N/A 05/22/2015   Procedure: ESOPHAGOGASTRODUODENOSCOPY (EGD);  Surgeon: Gatha Mayer, MD;  Location: [facility];  Service: Endoscopy;   Laterality: N/A;  . LAPAROSCOPIC APPENDECTOMY  03/30/2011   Procedure: APPENDECTOMY LAPAROSCOPIC;  Surgeon: Joyice Faster. Cornett, MD;  Location: [facility];  Service: General;  Laterality: N/A;  . LEFT HEART CATHETERIZATION WITH CORONARY ANGIOGRAM N/A 01/24/2013   Procedure: LEFT HEART CATHETERIZATION WITH CORONARY ANGIOGRAM;  Surgeon: [name], MD;  Location: [facility] CATH LAB;  Service: Cardiovascular;  Laterality: N/A;  Right heart with grafts  . LITHOTRIPSY     "once" (01/21/2013)  . NM MYOCAR PERF WALL MOTION  05/2009   persantine myoview - fixed moderate perfusion defect in inferior wall & lateral segment of apex (poor non-transmural infarction), minimal anterolateral periinfarct reversible ischemia seen, abnormal study, defects similar to 2006 study  . SPLENECTOMY, TOTAL  01/2013  . TEE WITHOUT CARDIOVERSION N/A 01/23/2013   Procedure: TRANSESOPHAGEAL ECHOCARDIOGRAM (TEE);  Surgeon: Sanda Klein, MD;  Location: [facility] ENDOSCOPY;  Service: Cardiovascular;  Laterality: N/A;  . TRANSTHORACIC ECHOCARDIOGRAM  06/23/2012   EF 40-45%, mild LVH; mild AV regurg; mild MV regurg; LV mod-severely dilated; RV mildly dilated; systolic pressure borderline increased; RA mod dilated    Allergies  Allergen Reactions  . Percocet [Oxycodone-Acetaminophen] Itching and Nausea Only    Current Outpatient Prescriptions  Medication Sig Dispense Refill  . aspirin EC 81 MG tablet Take 81 mg by mouth daily.    . carvedilol (COREG) 3.125 MG tablet Take 1 tablet (3.125 mg total) by mouth 2 (two) times daily with a meal. (Patient taking differently: Take 3.125 mg by mouth 2 times daily at 12 noon and 4 pm. 3 tablets 2X DAILY) 180 tablet 12  . furosemide (LASIX) 40 MG tablet Take 0.5 tablets (20 mg total) by mouth daily. 30 tablet 2  . lisinopril (PRINIVIL,ZESTRIL) 10 MG tablet Take 0.5 tablets (5 mg total) by mouth daily. (Patient taking differently: Take 5 mg by mouth daily. 1 WHOLE TABLET DAILY) 15 tablet 6  . pantoprazole  (PROTONIX) 40 MG tablet TAKE 1 TABLET BY MOUTH EVERY DAY 90 tablet 3  . Rivaroxaban (XARELTO) 15 MG TABS tablet Take 1 tablet (15 mg total) by mouth daily with supper. 30 tablet 5  . rosuvastatin (CRESTOR) 40 MG tablet TAKE 1 TABLET(40 MG) BY MOUTH DAILY 30 tablet 8  . traMADol (ULTRAM) 50 MG tablet TAKE ONE-HALF TABLET BY MOUTH EVERY 8 HOURS AS NEEDED FOR PAIN 30 tablet 0   No current facility-administered medications for this visit.     Social History   Social History  . Marital status: Married    Spouse name: N/A  .  Number of children: 32  . Years of education: 8   Occupational History  . retired    Social History Main Topics  . Smoking status: Former Smoker    Packs/day: 2.00    Years: 20.00    Types: Cigarettes    Quit date: 04/08/1986  . Smokeless tobacco: Current User    Types: Chew  . Alcohol use No  . Drug use: No  . Sexual activity: Not on file   Other Topics Concern  . Not on file   Social History Narrative   A she is married, 5 sons 4 daughters. He is retired Horticulturist, commercial. 2-3 caffeinated beverages a day no alcohol    Family History  Problem Relation Age of Onset  . Heart disease Father     rheumatic fever  . Heart attack Brother 64  . Stroke Mother   . Stroke Brother 68   ROS General: Negative; No fevers, chills, or night sweats;  HEENT: Negative; No changes in vision or hearing, sinus congestion, difficulty swallowing Pulmonary: Negative; No cough, wheezing, shortness of breath, hemoptysis Cardiovascular: See history of present illness GI: positive for GERD; No nausea, vomiting, diarrhea, or abdominal pain GU: Renal infarct Musculoskeletal: Negative; no myalgias, joint pain, or weakness Hematologic/Oncology: Negative; no easy bruising, bleeding Endocrine: Negative; no heat/cold intolerance; no diabetes Neuro: positive for remote CVA Skin: Negative; No rashes or skin lesions Psychiatric: Negative; No behavioral problems, depression Sleep: Negative;  No snoring, daytime sleepiness, hypersomnolence, bruxism, restless legs, hypnogognic hallucinations, no cataplexy Other comprehensive 14 point system review is negative.   PE BP (!) 102/54   Pulse (!) 58   Ht 5' 6.5" (1.689 m)   Wt 194 lb 3.2 oz (88.1 kg)   BMI 30.88 kg/m    Repeat blood pressure was 102/60  Wt Readings from Last 3 Encounters:  02/26/16 194 lb 3.2 oz (88.1 kg)  11/27/15 193 lb 8 oz (87.8 kg)  08/28/15 188 lb 3.2 oz (85.4 kg)   General: Alert, oriented, no distress.   Skin: normal turgor, no rashes HEENT: Normocephalic, atraumatic. Pupils round and reactive; sclera anicteric;no lid lag.  Nose without nasal septal hypertrophy Mouth/Parynx benign; Mallinpatti scale 3 Neck: No JVD, bilateral carotid bruits with slightly delayed upstroke. Lungs: clear to ausculatation and percussion; no wheezing or rales Chest wall: no tenderness to palpitation Heart: Irregularly irregular rhythm compatible with atrial fibrillation with a controlled ventricular response, s1 s2 normal 2/6 systolic murmur. No S3 gallop.  No diastolic murmur.  No rubs, thrills or heaves to Abdomen: soft, nontender; no hepatosplenomehaly, BS+; abdominal aorta nontender and not dilated by palpation. Back: no CVA tenderness Pulses 2+ Extremities: no clubbing cyanosis or edema, Homan's sign negative  Neurologic: grossly nonfocal Psychologic: normal affect and mood.  ECG (independently read by me): Atrial fibrillation with a rate at 58 bpm.  QS complex anteriorly.  Low voltage.  ECG (independently read by me): Atrial fibrillation with premature ventricular beats.  Poor R wave progression anteriorly.  Small nondiagnostic inferior Q waves.  December 2016 ECG (independently read by me): atrial fibrillation with rate in the 60s with occasional PVC.  QTc interval 452 ms  August 2016 ECG (independently read by me): Atrial fibrillation at 67 bpm.  2 ventricular premature beats.  Poor R-wave progression  May  2016 ECG (independently read by me): Atrial fibrillation with occasional PVCs.  Heart rate 72.  ECG (independently read by me): Atrial fibrillation with controlled ventricular response in the 70s.  Poor anterior R-wave  progression.  Prior April 2015 ECG (independently read by me): Atrial fibrillation at 56 beats per minute.  QTc interval 395 ms  Prior 02/23/2013 ECG: Atrial fibrillation at 80 beats per minute with isolated PVC.  LABS: BMP Latest Ref Rng & Units 01/29/2016 12/04/2015 11/27/2015  Glucose 65 - 99 mg/dL 97 100(H) 86  BUN 7 - 25 mg/dL 21 35(H) 25  Creatinine 0.70 - 1.11 mg/dL 1.62(H) 2.08(H) 2.04(H)  Sodium 135 - 146 mmol/L 139 139 140  Potassium 3.5 - 5.3 mmol/L 4.4 4.3 5.1  Chloride 98 - 110 mmol/L 103 104 104  CO2 20 - 31 mmol/L _0 Calcium 8.6 - 10.3 mg/dL 9.0 8.8 9.0   Hepatic Function Latest Ref Rng & Units 11/27/2015 10/09/2015 08/09/2015  Total Protein 6.1 - 8.1 g/dL 6.6 7.4 7.1  Albumin 3.6 - 5.1 g/dL 3.6 3.8 2.7(L)  AST 10 - 35 U/L _1 ALT 9 - 46 U/L 10 10 35  Alk Phosphatase 40 - 115 U/L 65 63 101  Total Bilirubin 0.2 - 1.2 mg/dL 0.5 0.5 0.3  Bilirubin, Direct 0.0 - 0.3 mg/dL - - -   CBC Latest Ref Rng & Units 11/27/2015 10/09/2015 09/11/2015  WBC 3.8 - 10.8 K/uL 7.5 7.7 6.5  Hemoglobin 13.2 - 17.1 g/dL 11.5(L) 11.3(L) 10.9(L)  Hematocrit 38.5 - 50.0 % 34.2(L) 34.0(L) 32.5(L)  Platelets 140 - 400 K/uL 207 223.0 227.0   Lab Results  Component Value Date   MCV 88.8 11/27/2015   MCV 89.0 10/09/2015   MCV 87.6 09/11/2015   Lab Results  Component Value Date   TSH 4.22 11/27/2015   Lab Results  Component Value Date   HGBA1C 6.0 (H) 07/13/2013   Lipid Panel     Component Value Date/Time   CHOL 134 11/27/2015 0830   TRIG 97 11/27/2015 0830   HDL 48 11/27/2015 0830   CHOLHDL 2.8 11/27/2015 0830   VLDL 19 11/27/2015 0830   LDLCALC 67 11/27/2015 0830     IMPRESSION:  1. CAD in native artery   2. Coronary artery disease involving coronary  bypass graft of native heart without angina pectoris   3. Renal insufficiency   4. Chronic atrial fibrillation (HCC)   5. Bilateral carotid artery disease ([place])   6. Renal infarct ([place])   7. Anticoagulation adequate   8. History of stroke     ASSESSMENT AND PLAN: Mr. Nikolay Demetriou is a 82 year old gentleman who is status post CABG revascularization surgery in 2003. He has a history of an  ischemic cardiomyopathy with an initial ejection fraction of 25% with subsequent normalization at 55-60%.  He has a remote history of a small right brain CVA in 2005  with subsequent recovery. 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 He has previously documented extensive thrombus in his left atrium and is on anticoagulation therapy with Xarelto 20 mg daily which needs to be lifelong.  When his Xarelto was held for an injection for pain management to the back.  He sustained a renal infarct with transient acute kidney insufficiency.  He denies any awareness of heme in stool or bleeding.  A nephrology standpoint, he is being followed closely by Dr. Posey Pronto.  His lisinopril had been held and recently this has been restarted and titrated up to 10 mg.  Blood work was done today at Dr. Serita Grit office.  We are treating him with aggressive high potency statin therapy  and attempt to induce plaque regression.  His most recent carotid Doppler studies were reviewed in detail and his velocities are slightly last then had been noted previously.  Presently, he still does not believe he wants to pursue carotid surgery, but at least he would consider this if situation changes.  His blood pressure today was stable.  He continues to be on anticoagulation without bleeding.  He will be following up with Dr. Posey Pronto frequently.  I will see him in 4-6 months for reevaluation.    Time spent: 25 minutes  Troy Sine, MD, [facility]  02/28/2016 6:08 PM

## 2016-03-04 DIAGNOSIS — N2581 Secondary hyperparathyroidism of renal origin: Secondary | ICD-10-CM | POA: Diagnosis not present

## 2016-03-04 DIAGNOSIS — N183 Chronic kidney disease, stage 3 (moderate): Secondary | ICD-10-CM | POA: Diagnosis not present

## 2016-03-04 DIAGNOSIS — I10 Essential (primary) hypertension: Secondary | ICD-10-CM | POA: Diagnosis not present

## 2016-03-04 DIAGNOSIS — D631 Anemia in chronic kidney disease: Secondary | ICD-10-CM | POA: Diagnosis not present

## 2016-03-04 NOTE — Progress Notes (Signed)
Pre visit review using our clinic review tool, if applicable. No additional management support is needed unless otherwise documented below in the visit note. 

## 2016-03-04 NOTE — Progress Notes (Signed)
Subjective:   Gregory [name] is a 82 y.o. male who presents w/ his daughter, [name], for an Initial Medicare Annual Wellness Visit.  He reports overall he is doing well and reports no acute concerns today.   Review of Systems  No ROS.  Medicare Wellness Visit.  Cardiac Risk Factors include: advanced age (>74men, >70 women);sedentary lifestyle;obesity (BMI >30kg/m2);hypertension;male gender  Sleep patterns: no sleep issues, feels rested on waking, does not get up to void and sleeps 10-12 hours nightly. Occassionally gets 'the fidgets' in his legs at night, but this does not seem overly bothersome to him. His daughter wonders if there is a medication that could help with this. She thinks he was prescribed something earlier this year while he was hospitalized.   Home Safety/Smoke Alarms: Feels safe in home. Smoke alarms in place.    Living environment; residence and Firearm Safety: Lives w/ wife in [place] house/ trailer, firearms stored safely.  Seat Belt Safety/Bike Helmet: Wears seat belt.   Counseling:   Eye Exam- Follows w/ Dr. Talbert Forest yearly and PRN. Wears reading glasses.   Dental- Dentures upper and lower. Does not see dentist regularly.  Male:   CCS- N/A due to age     PSA-  Lab Results  Component Value Date   PSA 1.24 07/13/2013      Objective:    Today's Vitals   03/05/16 1438  BP: (!) 102/58  Pulse: (!) 55  Resp: 16  SpO2: 97%  Weight: 189 lb (85.7 kg)  Height: 5' 6.5" (1.689 m)   Body mass index is 30.05 kg/m.   BP Readings from Last 3 Encounters:  03/05/16 (!) 102/58  02/26/16 (!) 102/54  11/27/15 (!) 90/50   Current Medications (verified) Outpatient Encounter Prescriptions as of 03/05/2016  Medication Sig  . aspirin EC 81 MG tablet Take 81 mg by mouth daily.  . carvedilol (COREG) 3.125 MG tablet Take 1 tablet (3.125 mg total) by mouth 2 (two) times daily with a meal. (Patient taking differently: Take 3.125 mg by mouth 2 times daily at 12 noon and 4  pm. 3 tablets 2X DAILY)  . furosemide (LASIX) 40 MG tablet Take 0.5 tablets (20 mg total) by mouth daily.  Marland Kitchen lisinopril (PRINIVIL,ZESTRIL) 10 MG tablet Take 0.5 tablets (5 mg total) by mouth daily. (Patient taking differently: Take 10 mg by mouth daily. 1 WHOLE TABLET DAILY)  . pantoprazole (PROTONIX) 40 MG tablet TAKE 1 TABLET BY MOUTH EVERY DAY  . Rivaroxaban (XARELTO) 15 MG TABS tablet Take 1 tablet (15 mg total) by mouth daily with supper.  . rosuvastatin (CRESTOR) 40 MG tablet TAKE 1 TABLET(40 MG) BY MOUTH DAILY  . traMADol (ULTRAM) 50 MG tablet TAKE ONE-HALF TABLET BY MOUTH EVERY 8 HOURS AS NEEDED FOR PAIN   No facility-administered encounter medications on file as of 03/05/2016.     Allergies (verified) Percocet [oxycodone-acetaminophen]   History: Past Medical History:  Diagnosis Date  . Appendicitis with abscess 03/31/2011  . Arthritis   . CAD (coronary artery disease),CABG 1993-LIMA to the LAD, SVG to Om and SVG to PDA/PLA, patent on cath 2008. 2003   a. s/p CABG 2003. b. last cath 2008 with patent grafts.  . Carotid artery disease ([place])    a. Severe - pt refused intervention in the past.  . Chronic atrial fibrillation ([place])    permanent  . Chronic systolic CHF (congestive heart failure) ([place])    a. Prior low EF, later normalized.  . Essential hypertension   .  GERD (gastroesophageal reflux disease)   . Heart attack 1980's  . Heart murmur   . Hyperlipidemia   . Ischemic cardiomyopathy   . Kidney stones    "passed all but one time when he had to have lithotripsy" (01/21/2013)  . Pulmonary hypertension   . 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 07/2015 - after holding Xarelto x 3 days for spinal procedure.  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 SSS (sick sinus syndrome) ([place])   . Stroke [facility]) 2007   "slightly drags left foot since; recovered qthing else" (01/21/2013)  . Syncope 07/2015   a. felt vasovagal in setting of pain from renal infarct.   Past Surgical History:  Procedure  Laterality Date  . BALLOON DILATION N/A 05/22/2015   Procedure: BALLOON DILATION;  Surgeon: Gatha Mayer, MD;  Location: [facility] ENDOSCOPY;  Service: Endoscopy;  Laterality: N/A;  . CARDIAC CATHETERIZATION  02/24/2002   reduced LV function, 60-70% prox RCA stenosis, 70% PLA ostial stenosis, 80% secondary branch of PLA stenosis - subsequent CABG (Dr. Jackie Plum)  . Carotid Doppler  06/2012   50-69% right bulb/prox ICA diameter reduction; 70-99% left bulb/prox ICA diameter reduction  . CATARACT EXTRACTION W/ INTRAOCULAR LENS IMPLANT Bilateral 2013  . CORONARY ANGIOPLASTY  07/05/2006   3 vessel CAD, patent LIMA to LAD, patentVG to OM, patent SVG to PDA & PLA, mild MR, severe LV systolic dysfunction (Dr. Gerrie Nordmann)  . CORONARY ARTERY BYPASS GRAFT  03/01/2002   LIMA to LAD, reverse SVG to OM, reverse SVG to PDA of RCA, reverse SVG to PLA of RCA, ligation of LA appendage (Dr. Servando Snare)  . ESOPHAGOGASTRODUODENOSCOPY  02/01/2012   Procedure: ESOPHAGOGASTRODUODENOSCOPY (EGD);  Surgeon: Beryle Beams, MD;  Location: [facility];  Service: Endoscopy;  Laterality: N/A;  . ESOPHAGOGASTRODUODENOSCOPY N/A 05/22/2015   Procedure: ESOPHAGOGASTRODUODENOSCOPY (EGD);  Surgeon: Gatha Mayer, MD;  Location: [facility];  Service: Endoscopy;  Laterality: N/A;  . LAPAROSCOPIC APPENDECTOMY  03/30/2011   Procedure: APPENDECTOMY LAPAROSCOPIC;  Surgeon: Joyice Faster. Cornett, MD;  Location: [place];  Service: General;  Laterality: N/A;  . LEFT HEART CATHETERIZATION WITH CORONARY ANGIOGRAM N/A 01/24/2013   Procedure: LEFT HEART CATHETERIZATION WITH CORONARY ANGIOGRAM;  Surgeon: [name], MD;  Location: [facility] CATH LAB;  Service: Cardiovascular;  Laterality: N/A;  Right heart with grafts  . LITHOTRIPSY     "once" (01/21/2013)  . NM MYOCAR PERF WALL MOTION  05/2009   persantine myoview - fixed moderate perfusion defect in inferior wall & lateral segment of apex (poor non-transmural infarction), minimal anterolateral periinfarct  reversible ischemia seen, abnormal study, defects similar to 2006 study  . SPLENECTOMY, TOTAL  01/2013  . TEE WITHOUT CARDIOVERSION N/A 01/23/2013   Procedure: TRANSESOPHAGEAL ECHOCARDIOGRAM (TEE);  Surgeon: Sanda Klein, MD;  Location: [facility] ENDOSCOPY;  Service: Cardiovascular;  Laterality: N/A;  . TRANSTHORACIC ECHOCARDIOGRAM  06/23/2012   EF 40-45%, mild LVH; mild AV regurg; mild MV regurg; LV mod-severely dilated; RV mildly dilated; systolic pressure borderline increased; RA mod dilated   Family History  Problem Relation Age of Onset  . Heart disease Father     rheumatic fever  . Heart attack Brother 43  . Stroke Mother   . Stroke Brother 64   Social History   Occupational History  . retired    Social History Main Topics  . Smoking status: Former Smoker    Packs/day: 2.00    Years: 20.00    Types: Cigarettes    Quit date: 04/08/1986  .  Smokeless tobacco: Current User    Types: Chew  . Alcohol use No  . Drug use: No  . Sexual activity: Not on file   Tobacco Counseling Ready to quit: No Counseling given: No  Activities of Daily Living In your present state of health, do you have any difficulty performing the following activities: 03/05/2016 07/31/2015  Hearing? N Y  Vision? N N  Difficulty concentrating or making decisions? N N  Walking or climbing stairs? N Y  Dressing or bathing? N N  Doing errands, shopping? N N  Preparing Food and eating ? N -  Using the Toilet? N -  In the past six months, have you accidently leaked urine? N -  Do you have problems with loss of bowel control? N -  Managing your Medications? N -  Managing your Finances? N -  Housekeeping or managing your Housekeeping? N -  Some recent data might be hidden    Immunizations and Health Maintenance Immunization History  Administered Date(s) Administered  . Influenza, High Dose Seasonal PF 12/04/2015  . Influenza,inj,Quad PF,36+ Mos 01/25/2013, 02/13/2015  . Meningococcal Polysaccharide  01/25/2013  . Pneumococcal Conjugate-13 10/22/2014  . Pneumococcal Polysaccharide-23 01/25/2013   There are no preventive care reminders to display for this patient.  Patient Care Team: Darreld Mclean, MD as PCP - General (Family Medicine) Troy Sine, MD as Consulting Physician (Cardiology) Darleen Crocker, MD as Consulting Physician (Ophthalmology) Elmarie Shiley, MD as Consulting Physician (Nephrology)  Indicate any recent Medical Services you may have received from other than [facility] providers in the past year (date may be approximate).    Assessment:   This is a routine wellness examination for Saben. Physical assessment deferred to PCP.  Hearing/Vision screen Hearing Screening Comments: Able to hear conversational tones w/o difficulty. No issues reported.  Vision Screening Comments: Wears reading glasses. Follows w/ Dr. Talbert Forest yearly. H/o laser eye surgery.  Dietary issues and exercise activities discussed: Current Exercise Habits: The patient does not participate in regular exercise at present  Diet (meal preparation, eat out, water intake, caffeinated beverages, dairy products, fruits and vegetables): on average, 3 meals per day. Pt reports generally good appetite, states 24 hours recall is not his typical diet. Normally big breakfast-eggs, ham/sausage, toast. Drinks water throughout the day. Drinks 3 cups coffee and 1 ginger ale or less daily. Like fresh fruits and vegetables.  24 Hour Recall: Breakfast: oatmeal Lunch: banana Dinner: cheese and crackers     Goals    . Weight (lb) < 165 lb (74.8 kg)      Depression Screen PHQ 2/9 Scores 03/05/2016 08/15/2015 10/22/2014  PHQ - 2 Score 0 0 0    Fall Risk Fall Risk  03/05/2016 08/15/2015 10/22/2014  Falls in the past year? Yes Yes No  Number falls in past yr: 1 1 -  Injury with Fall? Yes Yes -    Cognitive Function: MMSE - Mini Mental State Exam 03/05/2016  Orientation to time 5  Orientation to Place 5  Registration 3    Attention/ Calculation 0  Attention/Calculation-comments Pt cannot spell or count.  Recall 3  Language- name 2 objects 2  Language- repeat 1  Language- follow 3 step command 3  Language- read & follow direction 1  Write a sentence 0  Write a sentence-comments Pt does not write.  Copy design 1  Total score 24        Screening Tests Health Maintenance  Topic Date Due  . ZOSTAVAX  05/06/2016 (Originally  04/14/1994)  . TETANUS/TDAP  05/06/2016 (Originally 04/14/1951)  . INFLUENZA VACCINE  Completed  . PNA vac Low Risk Adult  Completed        Plan:   Follow-up w/ PCP as scheduled for CPE.  Pt to contact insurance company regarding coverage for Zostavax if he decides he wants immunization.  Eat heart healthy diet (full of fruits, vegetables, whole grains, lean protein, water--limit salt, fat, and sugar intake) and increase physical activity as tolerated.   Restless legs at night-RLS not on problem list. Chart reviewed, do not see any IP rxs for RLS. Defer to PCP for dx and treatment.   Bring a copy of your advance directives to your next office visit.  During the course of the visit Kj was educated and counseled about the following appropriate screening and preventive services:   Vaccines to include Pneumoccal, Influenza, Hepatitis B, Td, Zostavax, HCV  Cardiovascular disease screening  Diabetes screening  Glaucoma screening  Nutrition counseling  Patient Instructions (the written plan) were given to the patient.   Dorrene German, RN   03/05/2016

## 2016-03-05 ENCOUNTER — Encounter: Payer: Self-pay | Admitting: *Deleted

## 2016-03-05 ENCOUNTER — Ambulatory Visit (INDEPENDENT_AMBULATORY_CARE_PROVIDER_SITE_OTHER): Payer: Medicare Other | Admitting: *Deleted

## 2016-03-05 VITALS — BP 102/58 | HR 55 | Resp 16 | Ht 66.5 in | Wt 189.0 lb

## 2016-03-05 DIAGNOSIS — Z0000 Encounter for general adult medical examination without abnormal findings: Secondary | ICD-10-CM | POA: Diagnosis not present

## 2016-03-05 NOTE — Patient Instructions (Addendum)
Mr. Suppes , Thank you for taking time to come for your Medicare Wellness Visit. I appreciate your ongoing commitment to your health goals. Please review the following plan we discussed and let me know if I can assist you in the future.   Continue to eat heart healthy diet (full of fruits, vegetables, whole grains, lean protein, water--limit salt, fat, and sugar intake) and increase physical activity as tolerated.   These are the goals we discussed: Goals    . Weight (lb) < 165 lb (74.8 kg)       This is a list of the screening recommended for you and due dates:  Health Maintenance  Topic Date Due  . Shingles Vaccine  05/06/2016*  . Tetanus Vaccine  05/06/2016*  . Flu Shot  Completed  . Pneumonia vaccines  Completed  *Topic was postponed. The date shown is not the original due date.    Fall Prevention in the Home Introduction Falls can cause injuries. They can happen to people of all ages. There are many things you can do to make your home safe and to help prevent falls. What can I do on the outside of my home?  Regularly fix the edges of walkways and driveways and fix any cracks.  Remove anything that might make you trip as you walk through a door, such as a raised step or threshold.  Trim any bushes or trees on the path to your home.  Use bright outdoor lighting.  Clear any walking paths of anything that might make someone trip, such as rocks or tools.  Regularly check to see if handrails are loose or broken. Make sure that both sides of any steps have handrails.  Any raised decks and porches should have guardrails on the edges.  Have any leaves, snow, or ice cleared regularly.  Use sand or salt on walking paths during winter.  Clean up any spills in your garage right away. This includes oil or grease spills. What can I do in the bathroom?  Use night lights.  Install grab bars by the toilet and in the tub and shower. Do not use towel bars as grab bars.  Use  non-skid mats or decals in the tub or shower.  If you need to sit down in the shower, use a plastic, non-slip stool.  Keep the floor dry. Clean up any water that spills on the floor as soon as it happens.  Remove soap buildup in the tub or shower regularly.  Attach bath mats securely with double-sided non-slip rug tape.  Do not have throw rugs and other things on the floor that can make you trip. What can I do in the bedroom?  Use night lights.  Make sure that you have a light by your bed that is easy to reach.  Do not use any sheets or blankets that are too big for your bed. They should not hang down onto the floor.  Have a firm chair that has side arms. You can use this for support while you get dressed.  Do not have throw rugs and other things on the floor that can make you trip. What can I do in the kitchen?  Clean up any spills right away.  Avoid walking on wet floors.  Keep items that you use a lot in easy-to-reach places.  If you need to reach something above you, use a strong step stool that has a grab bar.  Keep electrical cords out of the way.  Do not  use floor polish or wax that makes floors slippery. If you must use wax, use non-skid floor wax.  Do not have throw rugs and other things on the floor that can make you trip. What can I do with my stairs?  Do not leave any items on the stairs.  Make sure that there are handrails on both sides of the stairs and use them. Fix handrails that are broken or loose. Make sure that handrails are as long as the stairways.  Check any carpeting to make sure that it is firmly attached to the stairs. Fix any carpet that is loose or worn.  Avoid having throw rugs at the top or bottom of the stairs. If you do have throw rugs, attach them to the floor with carpet tape.  Make sure that you have a light switch at the top of the stairs and the bottom of the stairs. If you do not have them, ask someone to add them for you. What  else can I do to help prevent falls?  Wear shoes that:  Do not have high heels.  Have rubber bottoms.  Are comfortable and fit you well.  Are closed at the toe. Do not wear sandals.  If you use a stepladder:  Make sure that it is fully opened. Do not climb a closed stepladder.  Make sure that both sides of the stepladder are locked into place.  Ask someone to hold it for you, if possible.  Clearly mark and make sure that you can see:  Any grab bars or handrails.  First and last steps.  Where the edge of each step is.  Use tools that help you move around (mobility aids) if they are needed. These include:  Canes.  Walkers.  Scooters.  Crutches.  Turn on the lights when you go into a dark area. Replace any light bulbs as soon as they burn out.  Set up your furniture so you have a clear path. Avoid moving your furniture around.  If any of your floors are uneven, fix them.  If there are any pets around you, be aware of where they are.  Review your medicines with your doctor. Some medicines can make you feel dizzy. This can increase your chance of falling. Ask your doctor what other things that you can do to help prevent falls. This information is not intended to replace advice given to you by your health care provider. Make sure you discuss any questions you have with your health care provider. Document Released: 12/27/2008 Document Revised: 08/08/2015 Document Reviewed: 04/06/2014  2017 [employer]

## 2016-03-27 ENCOUNTER — Other Ambulatory Visit: Payer: Self-pay | Admitting: Family Medicine

## 2016-03-27 ENCOUNTER — Other Ambulatory Visit: Payer: Self-pay | Admitting: Emergency Medicine

## 2016-03-27 MED ORDER — TRAMADOL HCL 50 MG PO TABS: ORAL_TABLET | ORAL | 1 refills | Status: DC

## 2016-03-27 NOTE — Telephone Encounter (Signed)
Patient's spouse called requesting a refill of traMADol (ULTRAM) 50 MG tablet  Please advise   Pharmacy: [facility] [place], [place] - [address] AT [place]

## 2016-03-27 NOTE — Telephone Encounter (Signed)
Received refill request for TRAMADOL 50MG  TABLETS. Last office visit 08/28/15 and last refill 02/21/16. Is it ok to refill? Please advise.

## 2016-04-06 ENCOUNTER — Telehealth: Payer: Self-pay | Admitting: Cardiovascular Disease

## 2016-04-06 NOTE — Telephone Encounter (Signed)
New Message     Pt needs more samples of the Xarelto 15mg s , do you have any at the office

## 2016-04-06 NOTE — Telephone Encounter (Signed)
Returned call, goes to VM, left msg to inform samples available at front desk.

## 2016-04-15 ENCOUNTER — Other Ambulatory Visit: Payer: Self-pay | Admitting: Cardiovascular Disease

## 2016-04-22 ENCOUNTER — Encounter: Payer: Self-pay | Admitting: Family Medicine

## 2016-04-22 ENCOUNTER — Ambulatory Visit (INDEPENDENT_AMBULATORY_CARE_PROVIDER_SITE_OTHER): Payer: Medicare Other | Admitting: Family Medicine

## 2016-04-22 VITALS — BP 122/70 | HR 70 | Temp 98.5°F | Ht 66.5 in | Wt 190.8 lb

## 2016-04-22 DIAGNOSIS — R7309 Other abnormal glucose: Secondary | ICD-10-CM | POA: Diagnosis not present

## 2016-04-22 DIAGNOSIS — Z0000 Encounter for general adult medical examination without abnormal findings: Secondary | ICD-10-CM

## 2016-04-22 DIAGNOSIS — D539 Nutritional anemia, unspecified: Secondary | ICD-10-CM | POA: Diagnosis not present

## 2016-04-22 DIAGNOSIS — I481 Persistent atrial fibrillation: Secondary | ICD-10-CM | POA: Diagnosis not present

## 2016-04-22 DIAGNOSIS — N189 Chronic kidney disease, unspecified: Secondary | ICD-10-CM | POA: Diagnosis not present

## 2016-04-22 DIAGNOSIS — I4819 Other persistent atrial fibrillation: Secondary | ICD-10-CM

## 2016-04-22 LAB — COMPREHENSIVE METABOLIC PANEL
ALT: 10 U/L (ref 0–53)
AST: 16 U/L (ref 0–37)
Albumin: 3.6 g/dL (ref 3.5–5.2)
Alkaline Phosphatase: 72 U/L (ref 39–117)
BUN: 32 mg/dL — AB (ref 6–23)
CHLORIDE: 99 meq/L (ref 96–112)
CO2: 31 meq/L (ref 19–32)
CREATININE: 2.03 mg/dL — AB (ref 0.40–1.50)
Calcium: 9.3 mg/dL (ref 8.4–10.5)
GFR: 33.58 mL/min — ABNORMAL LOW (ref >60.00)
GLUCOSE: 127 mg/dL — AB (ref 70–99)
POTASSIUM: 3.9 meq/L (ref 3.5–5.1)
SODIUM: 139 meq/L (ref 135–145)
Total Bilirubin: 0.4 mg/dL (ref 0.2–1.2)
Total Protein: 7.4 g/dL (ref 6.0–8.3)

## 2016-04-22 LAB — HEMOGLOBIN A1C: HEMOGLOBIN A1C: 6.5 % (ref 4.6–6.5)

## 2016-04-22 LAB — CBC
HEMATOCRIT: 31.5 % — AB (ref 39.0–52.0)
Hemoglobin: 10.4 g/dL — ABNORMAL LOW (ref 13.0–17.0)
MCHC: 33.2 g/dL (ref 30.0–36.0)
MCV: 86.3 fl (ref 78.0–100.0)
Platelets: 274 10*3/uL (ref 150.0–400.0)
RBC: 3.65 Mil/uL — AB (ref 4.22–5.81)
RDW: 16 % — AB (ref 11.5–15.5)
WBC: 7.2 10*3/uL (ref 4.0–10.5)

## 2016-04-22 NOTE — Patient Instructions (Addendum)
It was great to see you today- take care and let me know if you need anything We will get labs for you today

## 2016-04-22 NOTE — Progress Notes (Signed)
[facility] [address], [place], [place] [address] [phone]  Date:  04/22/2016   Name:  Gregory [name]   DOB:  1932/11/09   MRN:  [id number]  PCP:  Lamar Blinks, MD    Chief Complaint: Annual Exam (Pt here for CPE and is fasting for labs. )   History of Present Illness:  Gregory [name] is a 83 y.o. very pleasant male patient who presents with the following:  Here today for a CPE History of anemia, CRI, CAD, CVA, permanent a fib on rate control,bilateral carotid stenosis He saw cardiology in December-  He had a renal infarct last May after taking some time off xarelto for a procedure.  He uses tramadol 1-2x a week as needed for his back pain- he has stopped having shots for his back since he got the clot.    His most recent labs in November showed creat down to 1.62 He had a cholesterol panel in September that looked fine His most recent Hg was at 11.5  His tetanus shot was within the last 2 years   Lab Results  Component Value Date   HGBA1C 6.0 (H) 07/13/2013   He notes that he is feeling well He feels "a little feeble" due to his chronic back issues but is otherwise feeling well overall He will have some back pain with walking He is swallowing ok He denies any fainting, CP or SOB  He is eating well  Labs to [facility]  Wt Readings from Last 3 Encounters:  04/22/16 190 lb 12.8 oz (86.5 kg)  03/05/16 189 lb (85.7 kg)  02/26/16 194 lb 3.2 oz (88.1 kg)      Patient Active Problem List   Diagnosis Date Noted  . Chronic anemia 01/13/2016  . Chronic renal failure, stage 3 (moderate) 08/06/2015  . Vasovagal syncope   . Coronary artery disease involving native coronary artery   . 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 Syncope 07/30/2015  . Dysphagia   . Esophageal stricture   . Carotid stenosis 07/13/2013  . Anticoagulated on Xarelto 03/21/2013  . PVD - 12% LICA, 75% RICA by doppler 5/14  01/24/2013  . 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 CAD (coronary artery disease),CABG 1993-LIMA to the LAD, SVG to Om and SVG to PDA/PLA, patent on cath 2014. 03/31/2011  . Chronic a-fib ([place]) 03/31/2011  . Cardiomyopathy, ischemic, improved 03/31/2011  . CVA (cerebral vascular accident) ([place]) 03/31/2011  . GERD (gastroesophageal reflux disease) 03/31/2011  . Renal calculi 03/31/2011  . History of tobacco use, continues with chewing tobacco 03/31/2011    Past Medical History:  Diagnosis Date  . Appendicitis with abscess 03/31/2011  . Arthritis   . CAD (coronary artery disease),CABG 1993-LIMA to the LAD, SVG to Om and SVG to PDA/PLA, patent on cath 2008. 2003   a. s/p CABG 2003. b. last cath 2008 with patent grafts.  . Carotid artery disease ([place])    a. Severe - pt refused intervention in the past.  . Chronic atrial fibrillation ([place])    permanent  . Chronic systolic CHF (congestive heart failure) ([place])    a. Prior low EF, later normalized.  . Essential hypertension   . GERD (gastroesophageal reflux disease)   . Heart attack 1980's  . Heart murmur   . Hyperlipidemia   . Ischemic cardiomyopathy   . Kidney stones    "passed  all but one time when he had to have lithotripsy" (01/21/2013)  . Pulmonary hypertension   . Renal infarct ([place])    a. 07/2015 - after holding Xarelto x 3 days for spinal procedure.  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 SSS (sick sinus syndrome) ([place])   . Stroke [facility]) 2007   "slightly drags left foot since; recovered qthing else" (01/21/2013)  . Syncope 07/2015   a. felt vasovagal in setting of pain from renal infarct.    Past Surgical History:  Procedure Laterality Date  . BALLOON DILATION N/A 05/22/2015   Procedure: BALLOON DILATION;  Surgeon: Gatha Mayer, MD;  Location: [facility];  Service: Endoscopy;  Laterality: N/A;  . CARDIAC CATHETERIZATION  02/24/2002   reduced LV function, 60-70% prox RCA  stenosis, 70% PLA ostial stenosis, 80% secondary branch of PLA stenosis - subsequent CABG (Dr. Jackie Plum)  . Carotid Doppler  06/2012   50-69% right bulb/prox ICA diameter reduction; 70-99% left bulb/prox ICA diameter reduction  . CATARACT EXTRACTION W/ INTRAOCULAR LENS IMPLANT Bilateral 2013  . CORONARY ANGIOPLASTY  07/05/2006   3 vessel CAD, patent LIMA to LAD, patentVG to OM, patent SVG to PDA & PLA, mild MR, severe LV systolic dysfunction (Dr. Gerrie Nordmann)  . CORONARY ARTERY BYPASS GRAFT  03/01/2002   LIMA to LAD, reverse SVG to OM, reverse SVG to PDA of RCA, reverse SVG to PLA of RCA, ligation of LA appendage (Dr. Servando Snare)  . ESOPHAGOGASTRODUODENOSCOPY  02/01/2012   Procedure: ESOPHAGOGASTRODUODENOSCOPY (EGD);  Surgeon: Beryle Beams, MD;  Location: [facility];  Service: Endoscopy;  Laterality: N/A;  . ESOPHAGOGASTRODUODENOSCOPY N/A 05/22/2015   Procedure: ESOPHAGOGASTRODUODENOSCOPY (EGD);  Surgeon: Gatha Mayer, MD;  Location: [facility];  Service: Endoscopy;  Laterality: N/A;  . LAPAROSCOPIC APPENDECTOMY  03/30/2011   Procedure: APPENDECTOMY LAPAROSCOPIC;  Surgeon: Joyice Faster. Cornett, MD;  Location: [facility];  Service: General;  Laterality: N/A;  . LEFT HEART CATHETERIZATION WITH CORONARY ANGIOGRAM N/A 01/24/2013   Procedure: LEFT HEART CATHETERIZATION WITH CORONARY ANGIOGRAM;  Surgeon: [name], MD;  Location[facility] C[facility] LAB;  Service: Cardiovascular;  Laterality: N/A;  Right heart with grafts  . LITHOTRIPSY     "once" (01/21/2013)  . NM MYOCAR PERF WALL MOTION  05/2009   persantine myoview - fixed moderate perfusion defect in inferior wall & lateral segment of apex (poor non-transmural infarction), minimal anterolateral periinfarct reversible ischemia seen, abnormal study, defects similar to 2006 study  . SPLENECTOMY, TOTAL  01/2013  . TEE WITHOUT CARDIOVERSION N/A 01/23/2013   Procedure: TRANSESOPHAGEAL ECHOCARDIOGRAM (TEE);  Surgeon: Sanda Klein, MD;  Location: [facility] ENDOSCOPY;   Service: Cardiovascular;  Laterality: N/A;  . TRANSTHORACIC ECHOCARDIOGRAM  06/23/2012   EF 40-45%, mild LVH; mild AV regurg; mild MV regurg; LV mod-severely dilated; RV mildly dilated; systolic pressure borderline increased; RA mod dilated    Social History  Substance Use Topics  . Smoking status: Former Smoker    Packs/day: 2.00    Years: 20.00    Types: Cigarettes    Quit date: 04/08/1986  . Smokeless tobacco: Current User    Types: Chew  . Alcohol use No    Family History  Problem Relation Age of Onset  . Heart disease Father     rheumatic fever  . Heart attack Brother 5  . Stroke Mother   . Stroke Brother 98    Allergies  Allergen Reactions  . Percocet [Oxycodone-Acetaminophen] Itching and Nausea Only    Medication list has been reviewed and updated.  Current Outpatient Prescriptions on File Prior to Visit  Medication Sig Dispense Refill  . aspirin EC 81 MG tablet Take 81 mg by mouth daily.    . carvedilol (COREG) 3.125 MG tablet Take 1 tablet (3.125 mg total) by mouth 2 (two) times daily with a meal. (Patient taking differently: Take 3.125 mg by mouth 2 times daily at 12 noon and 4 pm. 3 tablets 2X DAILY) 180 tablet 12  . furosemide (LASIX) 40 MG tablet TAKE 1/2 TABLET(20 MG) BY MOUTH DAILY 30 tablet 0  . lisinopril (PRINIVIL,ZESTRIL) 10 MG tablet Take 0.5 tablets (5 mg total) by mouth daily. (Patient taking differently: Take 10 mg by mouth daily. 1 WHOLE TABLET DAILY) 15 tablet 6  . pantoprazole (PROTONIX) 40 MG tablet TAKE 1 TABLET BY MOUTH EVERY DAY 90 tablet 3  . Rivaroxaban (XARELTO) 15 MG TABS tablet Take 1 tablet (15 mg total) by mouth daily with supper. 30 tablet 5  . rosuvastatin (CRESTOR) 40 MG tablet TAKE 1 TABLET(40 MG) BY MOUTH DAILY 30 tablet 8  . traMADol (ULTRAM) 50 MG tablet TAKE ONE-HALF TABLET BY MOUTH EVERY 8 HOURS AS NEEDED FOR PAIN 30 tablet 1   No current facility-administered medications on file prior to visit.     Review of Systems:  As  per HPI- otherwise negative.   Physical Examination: Vitals:   04/22/16 1305  BP: 122/70  Pulse: 70  Temp: 98.5 F (36.9 C)   Vitals:   04/22/16 1305  Weight: 190 lb 12.8 oz (86.5 kg)  Height: 5' 6.5" (1.689 m)   Body mass index is 30.33 kg/m. Ideal Body Weight: Weight in (lb) to have BMI = 25: 156.9  GEN: WDWN, NAD, Non-toxic, A & O x 3, older man who looks well, here today with his daughter [name] today HEENT: Atraumatic, Normocephalic. Neck supple. No masses, No LAD.  Bilateral TM wnl, oropharynx normal.  PEERL,EOMI.   Ears and Nose: No external deformity. CV: RRR, No M/G/R. No JVD. No thrill. No extra heart sounds. PULM: CTA B, no wheezes, crackles, rhonchi. No retractions. No resp. distress. No accessory muscle use. ABD: S, NT, ND EXTR: No c/c/e NEURO Normal gait.  PSYCH: Normally interactive. Conversant. Not depressed or anxious appearing.  Calm demeanor.    Assessment and Plan: Physical exam  Persistent atrial fibrillation (HCC)  Deficiency anemia - Plan: CBC  Chronic kidney disease, unspecified CKD stage - Plan: Comprehensive metabolic panel  Elevated glucose - Plan: Hemoglobin A1c  Here today for a CPE   He has persistent a fib which is rate controlled He also has a history of CAD, carotid artery disease.  These are being followed for now He suffered an acute kidney insult last year and has gotten much better He does see nephrology- dr Elmarie Shiley- on a regular basis Will check his CBC, CMP and A1ctoday and follow-up pending her results   Signed Lamar Blinks, MD

## 2016-04-22 NOTE — Progress Notes (Signed)
Pre visit review using our clinic review tool, if applicable. No additional management support is needed unless otherwise documented below in the visit note. 

## 2016-05-07 ENCOUNTER — Telehealth: Payer: Self-pay | Admitting: Cardiovascular Disease

## 2016-05-07 NOTE — Telephone Encounter (Signed)
New Message    Patient calling the office for samples of medication:   1.  What medication and dosage are you requesting samples for?  Rivaroxaban (XARELTO) 15 MG TABS tablet    2.  Are you currently out of this medication?  Has 1 left

## 2016-05-07 NOTE — Telephone Encounter (Signed)
Medication samples have been provided to the patient.  Drug name: xarelto 15mg   Qty: 28  LOT: [id number]  Exp.Date: 03/2018  Samples left at front desk for patient pick-up. Patient notified.  Sheral Apley M 2:44 PM 05/07/2016

## 2016-05-14 ENCOUNTER — Telehealth: Payer: Self-pay | Admitting: Family Medicine

## 2016-05-14 NOTE — Telephone Encounter (Signed)
Relation to XQ:KSKS Call back number:[phone] Pharmacy: [facility] [place], [place] [phone] (Phone) [phone] (Fax)     Reason for call:  Daughter requesting prescription for restless leg (Rx name is unknown), daughter states its been awhile since PCP has prescribed,patient last cpe 04/22/16, please advise

## 2016-05-15 ENCOUNTER — Encounter: Payer: Self-pay | Admitting: Family

## 2016-05-15 ENCOUNTER — Ambulatory Visit (INDEPENDENT_AMBULATORY_CARE_PROVIDER_SITE_OTHER): Payer: Medicare Other | Admitting: Family

## 2016-05-15 ENCOUNTER — Telehealth: Payer: Self-pay | Admitting: Family Medicine

## 2016-05-15 ENCOUNTER — Encounter (HOSPITAL_COMMUNITY): Payer: Self-pay | Admitting: Emergency Medicine

## 2016-05-15 ENCOUNTER — Inpatient Hospital Stay (HOSPITAL_BASED_OUTPATIENT_CLINIC_OR_DEPARTMENT_OTHER)
Admission: EM | Admit: 2016-05-15 | Discharge: 2016-05-22 | DRG: 378 | Disposition: A | Discharge status: Home / Self Care | Payer: Medicare Other | Attending: Internal Medicine | Admitting: Internal Medicine

## 2016-05-15 ENCOUNTER — Ambulatory Visit (HOSPITAL_BASED_OUTPATIENT_CLINIC_OR_DEPARTMENT_OTHER)
Admission: RE | Admit: 2016-05-15 | Discharge: 2016-05-15 | Disposition: A | Discharge status: Home / Self Care | Payer: Medicare Other | Source: Ambulatory Visit | Attending: Family | Admitting: Family

## 2016-05-15 VITALS — BP 109/44 | HR 68 | Temp 97.6°F | Resp 16 | Ht 66.0 in | Wt 192.8 lb

## 2016-05-15 DIAGNOSIS — Z961 Presence of intraocular lens: Secondary | ICD-10-CM | POA: Diagnosis present

## 2016-05-15 DIAGNOSIS — I25810 Atherosclerosis of coronary artery bypass graft(s) without angina pectoris: Secondary | ICD-10-CM | POA: Diagnosis not present

## 2016-05-15 DIAGNOSIS — K648 Other hemorrhoids: Secondary | ICD-10-CM | POA: Diagnosis not present

## 2016-05-15 DIAGNOSIS — I739 Peripheral vascular disease, unspecified: Secondary | ICD-10-CM | POA: Diagnosis not present

## 2016-05-15 DIAGNOSIS — Z8249 Family history of ischemic heart disease and other diseases of the circulatory system: Secondary | ICD-10-CM

## 2016-05-15 DIAGNOSIS — D125 Benign neoplasm of sigmoid colon: Secondary | ICD-10-CM | POA: Diagnosis present

## 2016-05-15 DIAGNOSIS — R0602 Shortness of breath: Secondary | ICD-10-CM | POA: Diagnosis not present

## 2016-05-15 DIAGNOSIS — I482 Chronic atrial fibrillation, unspecified: Secondary | ICD-10-CM | POA: Diagnosis present

## 2016-05-15 DIAGNOSIS — Z9841 Cataract extraction status, right eye: Secondary | ICD-10-CM | POA: Diagnosis not present

## 2016-05-15 DIAGNOSIS — Z7901 Long term (current) use of anticoagulants: Secondary | ICD-10-CM | POA: Diagnosis not present

## 2016-05-15 DIAGNOSIS — N183 Chronic kidney disease, stage 3 unspecified: Secondary | ICD-10-CM | POA: Diagnosis present

## 2016-05-15 DIAGNOSIS — I6529 Occlusion and stenosis of unspecified carotid artery: Secondary | ICD-10-CM | POA: Diagnosis not present

## 2016-05-15 DIAGNOSIS — Z9842 Cataract extraction status, left eye: Secondary | ICD-10-CM | POA: Diagnosis not present

## 2016-05-15 DIAGNOSIS — I130 Hypertensive heart and chronic kidney disease with heart failure and stage 1 through stage 4 chronic kidney disease, or unspecified chronic kidney disease: Secondary | ICD-10-CM | POA: Diagnosis present

## 2016-05-15 DIAGNOSIS — R42 Dizziness and giddiness: Secondary | ICD-10-CM

## 2016-05-15 DIAGNOSIS — I5022 Chronic systolic (congestive) heart failure: Secondary | ICD-10-CM | POA: Diagnosis not present

## 2016-05-15 DIAGNOSIS — I252 Old myocardial infarction: Secondary | ICD-10-CM

## 2016-05-15 DIAGNOSIS — K922 Gastrointestinal hemorrhage, unspecified: Secondary | ICD-10-CM | POA: Diagnosis present

## 2016-05-15 DIAGNOSIS — I495 Sick sinus syndrome: Secondary | ICD-10-CM | POA: Diagnosis not present

## 2016-05-15 DIAGNOSIS — D62 Acute posthemorrhagic anemia: Secondary | ICD-10-CM | POA: Diagnosis not present

## 2016-05-15 DIAGNOSIS — Z87891 Personal history of nicotine dependence: Secondary | ICD-10-CM

## 2016-05-15 DIAGNOSIS — R404 Transient alteration of awareness: Secondary | ICD-10-CM | POA: Diagnosis not present

## 2016-05-15 DIAGNOSIS — E785 Hyperlipidemia, unspecified: Secondary | ICD-10-CM | POA: Diagnosis not present

## 2016-05-15 DIAGNOSIS — I25118 Atherosclerotic heart disease of native coronary artery with other forms of angina pectoris: Secondary | ICD-10-CM | POA: Diagnosis not present

## 2016-05-15 DIAGNOSIS — G2581 Restless legs syndrome: Secondary | ICD-10-CM | POA: Diagnosis present

## 2016-05-15 DIAGNOSIS — D649 Anemia, unspecified: Secondary | ICD-10-CM | POA: Diagnosis not present

## 2016-05-15 DIAGNOSIS — R933 Abnormal findings on diagnostic imaging of other parts of digestive tract: Secondary | ICD-10-CM

## 2016-05-15 DIAGNOSIS — I2510 Atherosclerotic heart disease of native coronary artery without angina pectoris: Secondary | ICD-10-CM | POA: Diagnosis present

## 2016-05-15 DIAGNOSIS — Z9861 Coronary angioplasty status: Secondary | ICD-10-CM

## 2016-05-15 DIAGNOSIS — Z951 Presence of aortocoronary bypass graft: Secondary | ICD-10-CM

## 2016-05-15 DIAGNOSIS — I255 Ischemic cardiomyopathy: Secondary | ICD-10-CM | POA: Diagnosis present

## 2016-05-15 DIAGNOSIS — K5730 Diverticulosis of large intestine without perforation or abscess without bleeding: Secondary | ICD-10-CM | POA: Diagnosis not present

## 2016-05-15 DIAGNOSIS — K449 Diaphragmatic hernia without obstruction or gangrene: Secondary | ICD-10-CM | POA: Diagnosis not present

## 2016-05-15 DIAGNOSIS — I272 Pulmonary hypertension, unspecified: Secondary | ICD-10-CM | POA: Diagnosis not present

## 2016-05-15 DIAGNOSIS — R531 Weakness: Secondary | ICD-10-CM | POA: Diagnosis not present

## 2016-05-15 DIAGNOSIS — Z885 Allergy status to narcotic agent status: Secondary | ICD-10-CM

## 2016-05-15 DIAGNOSIS — Z7982 Long term (current) use of aspirin: Secondary | ICD-10-CM

## 2016-05-15 DIAGNOSIS — Z8673 Personal history of transient ischemic attack (TIA), and cerebral infarction without residual deficits: Secondary | ICD-10-CM

## 2016-05-15 DIAGNOSIS — Z87442 Personal history of urinary calculi: Secondary | ICD-10-CM

## 2016-05-15 DIAGNOSIS — K219 Gastro-esophageal reflux disease without esophagitis: Secondary | ICD-10-CM | POA: Diagnosis not present

## 2016-05-15 DIAGNOSIS — Z823 Family history of stroke: Secondary | ICD-10-CM

## 2016-05-15 DIAGNOSIS — Z79899 Other long term (current) drug therapy: Secondary | ICD-10-CM

## 2016-05-15 DIAGNOSIS — R509 Fever, unspecified: Secondary | ICD-10-CM | POA: Diagnosis not present

## 2016-05-15 DIAGNOSIS — Z9081 Acquired absence of spleen: Secondary | ICD-10-CM

## 2016-05-15 DIAGNOSIS — K921 Melena: Secondary | ICD-10-CM | POA: Diagnosis not present

## 2016-05-15 LAB — CBC WITH DIFFERENTIAL/PLATELET
BASOS ABS: 0 {cells}/uL (ref 0–200)
Basophils Relative: 0 %
EOS ABS: 276 {cells}/uL (ref 15–500)
Eosinophils Relative: 4 %
HEMATOCRIT: 20.9 % — AB (ref 38.5–50.0)
Hemoglobin: 6.7 g/dL — ABNORMAL LOW (ref 13.2–17.1)
LYMPHS PCT: 36 %
Lymphs Abs: 2484 cells/uL (ref 850–3900)
MCH: 27.6 pg (ref 27.0–33.0)
MCHC: 32.1 g/dL (ref 32.0–36.0)
MCV: 86 fL (ref 80.0–100.0)
MONO ABS: 759 {cells}/uL (ref 200–950)
MONOS PCT: 11 %
MPV: 8.5 fL (ref 7.5–12.5)
Neutro Abs: 3381 cells/uL (ref 1500–7800)
Neutrophils Relative %: 49 %
PLATELETS: 242 10*3/uL (ref 140–400)
RBC: 2.43 MIL/uL — ABNORMAL LOW (ref 4.20–5.80)
RDW: 16.6 % — AB (ref 11.0–15.0)
WBC: 6.9 10*3/uL (ref 3.8–10.8)

## 2016-05-15 LAB — I-STAT CHEM 8, ED
BUN: 32 mg/dL — ABNORMAL HIGH (ref 6–20)
CHLORIDE: 109 mmol/L (ref 101–111)
Calcium, Ion: 1.12 mmol/L — ABNORMAL LOW (ref 1.15–1.40)
Creatinine, Ser: 1.9 mg/dL — ABNORMAL HIGH (ref 0.61–1.24)
Glucose, Bld: 92 mg/dL (ref 65–99)
HCT: 19 % — ABNORMAL LOW (ref 39.0–52.0)
Hemoglobin: 6.5 g/dL — CL (ref 13.0–17.0)
Potassium: 3.9 mmol/L (ref 3.5–5.1)
SODIUM: 140 mmol/L (ref 135–145)
TCO2: 25 mmol/L (ref 0–100)

## 2016-05-15 LAB — COMPREHENSIVE METABOLIC PANEL
ALT: 8 U/L — ABNORMAL LOW (ref 9–46)
AST: 14 U/L (ref 10–35)
Albumin: 3.3 g/dL — ABNORMAL LOW (ref 3.6–5.1)
Alkaline Phosphatase: 63 U/L (ref 40–115)
BILIRUBIN TOTAL: 0.5 mg/dL (ref 0.2–1.2)
BUN: 33 mg/dL — AB (ref 7–25)
CO2: 25 mmol/L (ref 20–31)
CREATININE: 1.56 mg/dL — AB (ref 0.70–1.11)
Calcium: 8.6 mg/dL (ref 8.6–10.3)
Chloride: 107 mmol/L (ref 98–110)
GLUCOSE: 117 mg/dL — AB (ref 65–99)
Potassium: 4.8 mmol/L (ref 3.5–5.3)
Sodium: 140 mmol/L (ref 135–146)
Total Protein: 6.2 g/dL (ref 6.1–8.1)

## 2016-05-15 LAB — TSH: TSH: 3.18 m[IU]/L (ref 0.40–4.50)

## 2016-05-15 MED ORDER — PANTOPRAZOLE SODIUM 40 MG IV SOLR
40.0000 mg | Freq: Once | INTRAVENOUS | Status: AC
  Administered 2016-05-16: 40 mg via INTRAVENOUS
  Filled 2016-05-15: qty 40

## 2016-05-15 MED ORDER — SODIUM CHLORIDE 0.9 % IV SOLN
8.0000 mg/h | INTRAVENOUS | Status: DC
  Administered 2016-05-16 (×3): 8 mg/h via INTRAVENOUS
  Filled 2016-05-15 (×7): qty 80

## 2016-05-15 MED ORDER — SODIUM CHLORIDE 0.9 % IV SOLN
Freq: Once | INTRAVENOUS | Status: AC
  Administered 2016-05-16: 01:00:00 via INTRAVENOUS

## 2016-05-15 MED ORDER — ROPINIROLE HCL 0.25 MG PO TABS: 0.2500 mg | ORAL_TABLET | Freq: Two times a day (BID) | ORAL | 1 refills | Status: DC

## 2016-05-15 NOTE — Progress Notes (Addendum)
Subjective:    Patient ID: Gregory [name], male    DOB: 02/06/1933, 83 y.o.   MRN: [id number]  HPI  Gregory [name] is an 83 yr old male with complicated pmhx including CAD, AF, CVA, ICM, chronic anemia, carotid stenosis, who presents today with chief complaint of leg pain. He is accompanied by his two daughters today. He lives with his wife and daughters report that he remains active in his shop and doing yard work.  He has a history of restless leg syndrome per daughters.  Reports that he has pain in both legs.  Reports that he has not pain with sitting but when he stands up he has an aching pain in both legs.   Pain is mainly located in the front of his thighs. Denies back pain. Notes mild sob the last "couple of days."  SOB is worse with walking. Denies chest pain. Notes chronic palpitations which are unchanged from baseline.   He also reports + dizziness with standing. Reports that dizziness started 2 days ago.  Notes that dizziness occurs with standing.     Review of Systems See HPI  Past Medical History:  Diagnosis Date  . Appendicitis with abscess 03/31/2011  . Arthritis   . CAD (coronary artery disease),CABG 1993-LIMA to the LAD, SVG to Om and SVG to PDA/PLA, patent on cath 2008. 2003   a. s/p CABG 2003. b. last cath 2008 with patent grafts.  . Carotid artery disease ([place])    a. Severe - pt refused intervention in the past.  . Chronic atrial fibrillation ([place])    permanent  . Chronic systolic CHF (congestive heart failure) ([place])    a. Prior low EF, later normalized.  . Essential hypertension   . GERD (gastroesophageal reflux disease)   . Heart attack 1980's  . Heart murmur   . Hyperlipidemia   . Ischemic cardiomyopathy   . Kidney stones    "passed all but one time when he had to have lithotripsy" (01/21/2013)  . Pulmonary hypertension   . Renal infarct ([place])    a. 07/2015 - after holding Xarelto x 3 days for spinal procedure.  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 SSS (sick sinus  syndrome) ([place])   . Stroke [facility]) 2007   "slightly drags left foot since; recovered qthing else" (01/21/2013)  . Syncope 07/2015   a. felt vasovagal in setting of pain from renal infarct.     Social History   Social History  . Marital status: Married    Spouse name: N/A  . Number of children: 35  . Years of education: 8   Occupational History  . retired    Social History Main Topics  . Smoking status: Former Smoker    Packs/day: 2.00    Years: 20.00    Types: Cigarettes    Quit date: 04/08/1986  . Smokeless tobacco: Current User    Types: Chew  . Alcohol use No  . Drug use: No  . Sexual activity: Not on file   Other Topics Concern  . Not on file   Social History Narrative   A she is married, 5 sons 4 daughters. He is retired Horticulturist, commercial. 2-3 caffeinated beverages a day no alcohol    Past Surgical History:  Procedure Laterality Date  . BALLOON DILATION N/A 05/22/2015   Procedure: BALLOON DILATION;  Surgeon: Gatha Mayer, MD;  [facility]: [facility];  Service: Endoscopy;  Laterality: N/A;  . CARDIAC CATHETERIZATION  02/24/2002   reduced LV  function, 60-70% prox RCA stenosis, 70% PLA ostial stenosis, 80% secondary branch of PLA stenosis - subsequent CABG (Dr. Jackie Plum)  . Carotid Doppler  06/2012   50-69% right bulb/prox ICA diameter reduction; 70-99% left bulb/prox ICA diameter reduction  . CATARACT EXTRACTION W/ INTRAOCULAR LENS IMPLANT Bilateral 2013  . CORONARY ANGIOPLASTY  07/05/2006   3 vessel CAD, patent LIMA to LAD, patentVG to OM, patent SVG to PDA & PLA, mild MR, severe LV systolic dysfunction (Dr. Gerrie Nordmann)  . CORONARY ARTERY BYPASS GRAFT  03/01/2002   LIMA to LAD, reverse SVG to OM, reverse SVG to PDA of RCA, reverse SVG to PLA of RCA, ligation of LA appendage (Dr. Servando Snare)  . ESOPHAGOGASTRODUODENOSCOPY  02/01/2012   Procedure: ESOPHAGOGASTRODUODENOSCOPY (EGD);  Surgeon: Beryle Beams, MD;  Location: [facility];  Service: Endoscopy;  Laterality: N/A;  .  ESOPHAGOGASTRODUODENOSCOPY N/A 05/22/2015   Procedure: ESOPHAGOGASTRODUODENOSCOPY (EGD);  Surgeon: Gatha Mayer, MD;  Location: [facility];  Service: Endoscopy;  Laterality: N/A;  . LAPAROSCOPIC APPENDECTOMY  03/30/2011   Procedure: APPENDECTOMY LAPAROSCOPIC;  Surgeon: Joyice Faster. Cornett, MD;  Location: [place];  Service: General;  Laterality: N/A;  . LEFT HEART CATHETERIZATION WITH CORONARY ANGIOGRAM N/A 01/24/2013   Procedure: LEFT HEART CATHETERIZATION WITH CORONARY ANGIOGRAM;  Surgeon: [name], MD;  Location[facility] CATH LAB;  Service: Cardiovascular;  Laterality: N/A;  Right heart with grafts  . LITHOTRIPSY     "once" (01/21/2013)  . NM MYOCAR PERF WALL MOTION  05/2009   persantine myoview - fixed moderate perfusion defect in inferior wall & lateral segment of apex (poor non-transmural infarction), minimal anterolateral periinfarct reversible ischemia seen, abnormal study, defects similar to 2006 study  . SPLENECTOMY, TOTAL  01/2013  . TEE WITHOUT CARDIOVERSION N/A 01/23/2013   Procedure: TRANSESOPHAGEAL ECHOCARDIOGRAM (TEE);  Surgeon: Sanda Klein, MD;  Location: [facility] ENDOSCOPY;  Service: Cardiovascular;  Laterality: N/A;  . TRANSTHORACIC ECHOCARDIOGRAM  06/23/2012   EF 40-45%, mild LVH; mild AV regurg; mild MV regurg; LV mod-severely dilated; RV mildly dilated; systolic pressure borderline increased; RA mod dilated    Family History  Problem Relation Age of Onset  . Heart disease Father     rheumatic fever  . Heart attack Brother 59  . Stroke Mother   . Stroke Brother 30    Allergies  Allergen Reactions  . Percocet [Oxycodone-Acetaminophen] Itching and Nausea Only    Current Outpatient Prescriptions on File Prior to Visit  Medication Sig Dispense Refill  . aspirin EC 81 MG tablet Take 81 mg by mouth daily.    . carvedilol (COREG) 3.125 MG tablet Take 1 tablet (3.125 mg total) by mouth 2 (two) times daily with a meal. (Patient taking differently: Take 3.125 mg by mouth 2 times  daily at 12 noon and 4 pm. 3 tablets 2X DAILY) 180 tablet 12  . furosemide (LASIX) 40 MG tablet TAKE 1/2 TABLET(20 MG) BY MOUTH DAILY 30 tablet 0  . lisinopril (PRINIVIL,ZESTRIL) 10 MG tablet Take 0.5 tablets (5 mg total) by mouth daily. (Patient taking differently: Take 10 mg by mouth daily. 1 WHOLE TABLET DAILY) 15 tablet 6  . pantoprazole (PROTONIX) 40 MG tablet TAKE 1 TABLET BY MOUTH EVERY DAY 90 tablet 3  . Rivaroxaban (XARELTO) 15 MG TABS tablet Take 1 tablet (15 mg total) by mouth daily with supper. 30 tablet 5  . rosuvastatin (CRESTOR) 40 MG tablet TAKE 1 TABLET(40 MG) BY MOUTH DAILY 30 tablet 8  . traMADol (ULTRAM) 50 MG tablet TAKE  ONE-HALF TABLET BY MOUTH EVERY 8 HOURS AS NEEDED FOR PAIN 30 tablet 1   No current facility-administered medications on file prior to visit.     BP (!) 109/44 (BP Location: Left Arm, Patient Position: Sitting, Cuff Size: Large)   Pulse 68   Temp 97.6 F (36.4 C) (Oral)   Resp 16   Ht 5\' 6"  (1.676 m)   Wt 192 lb 12.8 oz (87.5 kg)   SpO2 100%   BMI 31.12 kg/m       Objective:   Physical Exam  Constitutional: He is oriented to person, place, and time. He appears well-developed and well-nourished. No distress.  HENT:  Head: Normocephalic and atraumatic.  Eyes: EOM are normal. Pupils are equal, round, and reactive to light. No scleral icterus.  Neck: Neck supple.  Cardiovascular: Normal rate and regular rhythm.   No murmur heard. Pulses:      Posterior tibial pulses are 2+ on the right side, and 2+ on the left side.  Pulmonary/Chest: Effort normal and breath sounds normal. No respiratory distress. He has no wheezes. He has no rales.  Musculoskeletal: He exhibits no edema.  Lymphadenopathy:    He has no cervical adenopathy.  Neurological: He is alert and oriented to person, place, and time. He exhibits normal muscle tone.  Moving legs intermittently during exam  Skin: Skin is warm and dry.  Psychiatric: He has a normal mood and affect. His  behavior is normal. Thought content normal.          Assessment & Plan:    Dizziness- No significant orthostasis noted. EKG notes AF today.  Differential includes new CVA, dehydration, secondary to AF.  He was mildly bradycardic at rest but HR up in the high 60's after walking.  He is only on coreg 3.125 and I do not want to stop due to his chf hx and AF.  Will obtain baseline labs, cmet, cbc, tsh, UA.  Encouraged adequate intake of protein and fluids. Will obtain CT head to rule out CVA and also send for follow up carotid dopplers. Patient and daughters advised to bring pt to ED if new/worsening symptoms and to follow up with PCP in 1 week.  RLS- will initiate low dose requip bid due to his daytime symptoms.  Consider increasing dose/frequency if symptoms do not improve.  SOB- mild. Suspect related to CHF and AF.  Will obtain CXR.  He is anticoagulated with xarelto so clinically I doubt PE.    Case reviewed with Dr. Charlett Blake.

## 2016-05-15 NOTE — Telephone Encounter (Signed)
Called Cynthia back and LMOM- does she mean requip? I do not see this in his chart but it could have been used prior to going to Epic.  Will try her back

## 2016-05-15 NOTE — ED Triage Notes (Signed)
Pt BIB EMS from home. Went into Dr's office today after feeling weak, dizzy for the past couple days. Denies pain, discomfort. Dr's office called pt at home to tell him that he had low hemoglobin. Pt asymptomatic for EMS.

## 2016-05-15 NOTE — Progress Notes (Signed)
Pre visit review using our clinic review tool, if applicable. No additional management support is needed unless otherwise documented below in the visit note. 

## 2016-05-15 NOTE — Telephone Encounter (Signed)
Received call from team health RN. Regarding critical value of hemoglobin of 6.7. Called patient and spoke with him and his wife and advised him to go to the emergency room given this value and his history. They will take [facility] to [facility] for evaluation. I advised that they take him as soon as possible.

## 2016-05-15 NOTE — Patient Instructions (Addendum)
Complete lab work prior to leaving. Skip one dose of lasix, then resume. Make sure you are eating protein 3 times a day and that you are drinking enough water.  Stand up slowly.

## 2016-05-15 NOTE — ED Provider Notes (Signed)
[facility] DEPT Provider Note   CSN: [id number] Arrival date & time: 05/15/16  2245     History   Chief Complaint Chief Complaint  Patient presents with  . Abnormal Lab    HPI Gregory [name] is a 83 y.o. male old who presents emergency Department with symptomatic anemia. Patient states over the past 2 days he has had feeling dizzy and sob with exertion. He saw his PCP today who ran lab tests and called him at 9:30, telling him that his hemoglobin was very low and he needs to come the emergency Department for a blood transfusion. The patient is on Xarelto. He denies seeing any melena or hematochezia. He denies any hematuria or other overt signs of bleeding. He denies abdominal pain.  HPI  Past Medical History:  Diagnosis Date  . Appendicitis with abscess 03/31/2011  . Arthritis   . CAD (coronary artery disease),CABG 1993-LIMA to the LAD, SVG to Om and SVG to PDA/PLA, patent on cath 2008. 2003   a. s/p CABG 2003. b. last cath 2008 with patent grafts.  . Carotid artery disease ([place])    a. Severe - pt refused intervention in the past.  . Chronic atrial fibrillation ([place])    permanent  . Chronic systolic CHF (congestive heart failure) ([place])    a. Prior low EF, later normalized.  . Essential hypertension   . GERD (gastroesophageal reflux disease)   . Heart attack 1980's  . Heart murmur   . Hyperlipidemia   . Ischemic cardiomyopathy   . Kidney stones    "passed all but one time when he had to have lithotripsy" (01/21/2013)  . Pulmonary hypertension   . 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 07/2015 - after holding Xarelto x 3 days for spinal procedure.  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 SSS (sick sinus syndrome) ([place])   . Stroke [facility]) 2007   "slightly drags left foot since; recovered qthing else" (01/21/2013)  . Syncope 07/2015   a. felt vasovagal in setting of pain from renal infarct.    Patient Active Problem List   Diagnosis Date Noted  . GIB (gastrointestinal bleeding) 05/16/2016  .  Gastrointestinal hemorrhage with melena   . Symptomatic anemia   . Chronic anemia 01/13/2016  . Chronic renal failure, stage 3 (moderate) 08/06/2015  . Vasovagal syncope   . Coronary artery disease involving native coronary artery   . Renal infarction ([place]) 07/31/2015  . Syncope 07/30/2015  . Dysphagia   . Esophageal stricture   . Carotid stenosis 07/13/2013  . Anticoagulated on Xarelto 03/21/2013  . PVD - 03% LICA, 50% RICA by doppler 5/14 01/24/2013  . 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 CAD (coronary artery disease),CABG 1993-LIMA to the LAD, SVG to Om and SVG to PDA/PLA, patent on cath 2014. 03/31/2011  . Chronic a-fib ([place]) 03/31/2011  . Cardiomyopathy, ischemic, improved 03/31/2011  . CVA (cerebral vascular accident) ([place]) 03/31/2011  . GERD (gastroesophageal reflux disease) 03/31/2011  . Renal calculi 03/31/2011  . History of tobacco use, continues with chewing tobacco 03/31/2011    Past Surgical History:  Procedure Laterality Date  . BALLOON DILATION N/A 05/22/2015   Procedure: BALLOON DILATION;  Surgeon: Gatha Mayer, MD;  Location: [facility] ENDOSCOPY;  Service: Endoscopy;  Laterality: N/A;  . CARDIAC CATHETERIZATION  02/24/2002   reduced LV function, 60-70% prox RCA stenosis, 70% PLA ostial stenosis, 80% secondary branch of PLA stenosis - subsequent CABG (Dr. Lenna Sciara.  [place])  . Carotid Doppler  06/2012   50-69% right bulb/prox ICA diameter reduction; 70-99% left bulb/prox ICA diameter reduction  . CATARACT EXTRACTION W/ INTRAOCULAR LENS IMPLANT Bilateral 2013  . CORONARY ANGIOPLASTY  07/05/2006   3 vessel CAD, patent LIMA to LAD, patentVG to OM, patent SVG to PDA & PLA, mild MR, severe LV systolic dysfunction (Dr. Gerrie Nordmann)  . CORONARY ARTERY BYPASS GRAFT  03/01/2002   LIMA to LAD, reverse SVG to OM, reverse SVG to PDA of RCA, reverse SVG to PLA of RCA, ligation of LA appendage (Dr. Servando Snare)  . ESOPHAGOGASTRODUODENOSCOPY   02/01/2012   Procedure: ESOPHAGOGASTRODUODENOSCOPY (EGD);  Surgeon: Beryle Beams, MD;  Location: [facility];  Service: Endoscopy;  Laterality: N/A;  . ESOPHAGOGASTRODUODENOSCOPY N/A 05/22/2015   Procedure: ESOPHAGOGASTRODUODENOSCOPY (EGD);  Surgeon: Gatha Mayer, MD;  Location: [facility];  Service: Endoscopy;  Laterality: N/A;  . LAPAROSCOPIC APPENDECTOMY  03/30/2011   Procedure: APPENDECTOMY LAPAROSCOPIC;  Surgeon: Joyice Faster. Cornett, MD;  Location: [facility];  Service: General;  Laterality: N/A;  . LEFT HEART CATHETERIZATION WITH CORONARY ANGIOGRAM N/A 01/24/2013   Procedure: LEFT HEART CATHETERIZATION WITH CORONARY ANGIOGRAM;  Surgeon: [name], MD;  Location: [facility] CATH LAB;  Service: Cardiovascular;  Laterality: N/A;  Right heart with grafts  . LITHOTRIPSY     "once" (01/21/2013)  . NM MYOCAR PERF WALL MOTION  05/2009   persantine myoview - fixed moderate perfusion defect in inferior wall & lateral segment of apex (poor non-transmural infarction), minimal anterolateral periinfarct reversible ischemia seen, abnormal study, defects similar to 2006 study  . SPLENECTOMY, TOTAL  01/2013  . TEE WITHOUT CARDIOVERSION N/A 01/23/2013   Procedure: TRANSESOPHAGEAL ECHOCARDIOGRAM (TEE);  Surgeon: Sanda Klein, MD;  Location: [facility] ENDOSCOPY;  Service: Cardiovascular;  Laterality: N/A;  . TRANSTHORACIC ECHOCARDIOGRAM  06/23/2012   EF 40-45%, mild LVH; mild AV regurg; mild MV regurg; LV mod-severely dilated; RV mildly dilated; systolic pressure borderline increased; RA mod dilated       Home Medications    Prior to Admission medications   Medication Sig Start Date End Date Taking? Authorizing Provider  aspirin EC 81 MG tablet Take 81 mg by mouth daily.    Historical Provider, MD  carvedilol (COREG) 3.125 MG tablet Take 1 tablet (3.125 mg total) by mouth 2 (two) times daily with a meal. Patient taking differently: Take 3.125 mg by mouth 2 times daily at 12 noon and 4 pm. 3 tablets 2X DAILY  12/12/15   Troy Sine, MD  furosemide (LASIX) 40 MG tablet TAKE 1/2 TABLET(20 MG) BY MOUTH DAILY 04/15/16   Troy Sine, MD  lisinopril (PRINIVIL,ZESTRIL) 10 MG tablet Take 0.5 tablets (5 mg total) by mouth daily. Patient taking differently: Take 10 mg by mouth daily. 1 WHOLE TABLET DAILY 01/16/16   Troy Sine, MD  pantoprazole (PROTONIX) 40 MG tablet TAKE 1 TABLET BY MOUTH EVERY DAY 01/10/16   Troy Sine, MD  Rivaroxaban (XARELTO) 15 MG TABS tablet Take 1 tablet (15 mg total) by mouth daily with supper. 01/16/16   Troy Sine, MD  rOPINIRole (REQUIP) 0.25 MG tablet Take 1 tablet (0.25 mg total) by mouth 2 (two) times daily. 05/15/16   Debbrah Alar, NP  rosuvastatin (CRESTOR) 40 MG tablet TAKE 1 TABLET(40 MG) BY MOUTH DAILY 12/10/15   Troy Sine, MD  traMADol (ULTRAM) 50 MG tablet TAKE ONE-HALF TABLET BY MOUTH EVERY 8 HOURS AS NEEDED FOR PAIN 03/27/16   Darreld Mclean, MD  Family History Family History  Problem Relation Age of Onset  . Heart disease Father     rheumatic fever  . Heart attack Brother 97  . Stroke Mother   . Stroke Brother 57    Social History Social History  Substance Use Topics  . Smoking status: Former Smoker    Packs/day: 2.00    Years: 20.00    Types: Cigarettes    Quit date: 04/08/1986  . Smokeless tobacco: Current User    Types: Chew  . Alcohol use No     Allergies   Percocet [oxycodone-acetaminophen]   Review of Systems Review of Systems Ten systems reviewed and are negative for acute change, except as noted in the HPI.    Physical Exam Updated Vital Signs BP (!) 128/52   Pulse (!) 46   Temp 97.5 F (36.4 C) (Oral)   Resp 14   SpO2 100%   Physical Exam  Constitutional: He appears well-developed and well-nourished. No distress.  HENT:  Head: Normocephalic and atraumatic.  Eyes: Conjunctivae are normal. No scleral icterus.  Pale conjunctiva  Neck: Normal range of motion. Neck supple.  Cardiovascular: Normal rate,  regular rhythm and normal heart sounds.   Pulmonary/Chest: Effort normal and breath sounds normal. No respiratory distress.  Abdominal: Soft. There is no tenderness.  Genitourinary:  Genitourinary Comments: Digital Rectal Exam reveals sphincter with good tone. No external hemorrhoids. No masses or fissures. Stool color is melanotic.   Musculoskeletal: He exhibits no edema.  Neurological: He is alert.  Skin: Skin is warm and dry. He is not diaphoretic.  Psychiatric: His behavior is normal.  Nursing note and vitals reviewed.    ED Treatments / Results  Labs (all labs ordered are listed, but only abnormal results are displayed) Labs Reviewed  COMPREHENSIVE METABOLIC PANEL - Abnormal; Notable for the following:       Result Value   BUN 31 (*)    Creatinine, Ser 1.91 (*)    Calcium 8.8 (*)    Total Protein 6.1 (*)    Albumin 2.9 (*)    ALT 10 (*)    GFR calc non Af Amer 31 (*)    GFR calc Af Amer 36 (*)    All other components within normal limits  CBC WITH DIFFERENTIAL/PLATELET - Abnormal; Notable for the following:    RBC 2.25 (*)    Hemoglobin 6.3 (*)    HCT 19.9 (*)    RDW 16.6 (*)    All other components within normal limits  I-STAT CHEM 8, ED - Abnormal; Notable for the following:    BUN 32 (*)    Creatinine, Ser 1.90 (*)    Calcium, Ion 1.12 (*)    Hemoglobin 6.5 (*)    HCT 19.0 (*)    All other components within normal limits  POC OCCULT BLOOD, ED - Abnormal; Notable for the following:    Fecal Occult Bld POSITIVE (*)    All other components within normal limits  VITAMIN B12  FOLATE  IRON AND TIBC  FERRITIN  RETICULOCYTES  PREPARE RBC (CROSSMATCH)  TYPE AND SCREEN    EKG  EKG Interpretation None       Radiology Dg Chest 2 View  Result Date: 05/15/2016 CLINICAL DATA:  Shortness of breath for 3 days EXAM: CHEST  2 VIEW COMPARISON:  08/09/15 FINDINGS: Cardiac shadow remains enlarged. Postsurgical changes are noted. The lungs are clear bilaterally. No  acute bony abnormality is seen. IMPRESSION: No acute abnormality noted. Electronically Signed  By: Inez Catalina M.D.   On: 05/15/2016 17:11   Ct Head Wo Contrast  Result Date: 05/15/2016 CLINICAL DATA:  Dizziness starting yesterday, possible CVA EXAM: CT HEAD WITHOUT CONTRAST TECHNIQUE: Contiguous axial images were obtained from the base of the skull through the vertex without intravenous contrast. COMPARISON:  07/30/2015 FINDINGS: Brain: No intracranial hemorrhage, mass effect or midline shift. Stable cerebral atrophy. Stable periventricular and patchy subcortical chronic white matter disease. Old right basal ganglia infarct is stable. No definite acute cortical infarction. No mass lesion is noted on this unenhanced scan. Ventricular size is stable from prior exam. Vascular: Atherosclerotic calcifications of carotid siphon again noted. Skull: No skull fracture is noted. Sinuses/Orbits: No paranasal sinuses air-fluid levels. The mastoid air cells are unremarkable. Other: None IMPRESSION: No acute intracranial abnormality. Stable old infarct in right basal ganglia. Stable atrophy and chronic white matter disease. No definite acute cortical infarction. No mass lesion is noted on this unenhanced scan. Electronically Signed   By: Lahoma Crocker M.D.   On: 05/15/2016 17:13    Procedures .Critical Care Performed by: Margarita Mail Authorized by: Margarita Mail   Critical care provider statement:    Critical care time (minutes):  30   Critical care was necessary to treat or prevent imminent or life-threatening deterioration of the following conditions:  Circulatory failure   Critical care was time spent personally by me on the following activities:  Development of treatment plan with patient or surrogate, discussions with consultants, evaluation of patient's response to treatment, examination of patient, interpretation of cardiac output measurements, re-evaluation of patient's condition, ordering and review of  laboratory studies, ordering and performing treatments and interventions and pulse oximetry   (including critical care time)  Medications Ordered in ED Medications  pantoprazole (PROTONIX) 80 mg in sodium chloride 0.9 % 250 mL (0.32 mg/mL) infusion (not administered)  0.9 %  sodium chloride infusion (not administered)  pantoprazole (PROTONIX) injection 40 mg (40 mg Intravenous Given 05/16/16 0036)     Initial Impression / Assessment and Plan / ED Course  I have reviewed the triage vital signs and the nursing notes.  Pertinent labs & imaging results that were available during my care of the patient were reviewed by me and considered in my medical decision making (see chart for details).     patient will be admitted for transfusion and symptomatic anemia and GI bleed. Dr. Shanon Brow will admit.  Patient appears HDS at this time. I have ordered 2 units for transfusion.  Final Clinical Impressions(s) / ED Diagnoses   Final diagnoses:  Gastrointestinal hemorrhage with melena  Symptomatic anemia    New Prescriptions New Prescriptions   No medications on file     Margarita Mail, PA-C 05/16/16 [place], MD 05/16/16 [phone]

## 2016-05-16 DIAGNOSIS — D649 Anemia, unspecified: Secondary | ICD-10-CM | POA: Diagnosis not present

## 2016-05-16 DIAGNOSIS — I255 Ischemic cardiomyopathy: Secondary | ICD-10-CM | POA: Diagnosis present

## 2016-05-16 DIAGNOSIS — Z951 Presence of aortocoronary bypass graft: Secondary | ICD-10-CM | POA: Diagnosis not present

## 2016-05-16 DIAGNOSIS — D127 Benign neoplasm of rectosigmoid junction: Secondary | ICD-10-CM | POA: Diagnosis not present

## 2016-05-16 DIAGNOSIS — N183 Chronic kidney disease, stage 3 (moderate): Secondary | ICD-10-CM | POA: Diagnosis not present

## 2016-05-16 DIAGNOSIS — I495 Sick sinus syndrome: Secondary | ICD-10-CM | POA: Diagnosis present

## 2016-05-16 DIAGNOSIS — K648 Other hemorrhoids: Secondary | ICD-10-CM | POA: Diagnosis present

## 2016-05-16 DIAGNOSIS — E785 Hyperlipidemia, unspecified: Secondary | ICD-10-CM | POA: Diagnosis present

## 2016-05-16 DIAGNOSIS — K449 Diaphragmatic hernia without obstruction or gangrene: Secondary | ICD-10-CM | POA: Diagnosis present

## 2016-05-16 DIAGNOSIS — R531 Weakness: Secondary | ICD-10-CM | POA: Diagnosis present

## 2016-05-16 DIAGNOSIS — R509 Fever, unspecified: Secondary | ICD-10-CM | POA: Diagnosis not present

## 2016-05-16 DIAGNOSIS — D125 Benign neoplasm of sigmoid colon: Secondary | ICD-10-CM | POA: Diagnosis present

## 2016-05-16 DIAGNOSIS — I2510 Atherosclerotic heart disease of native coronary artery without angina pectoris: Secondary | ICD-10-CM | POA: Diagnosis present

## 2016-05-16 DIAGNOSIS — D509 Iron deficiency anemia, unspecified: Secondary | ICD-10-CM | POA: Diagnosis not present

## 2016-05-16 DIAGNOSIS — K222 Esophageal obstruction: Secondary | ICD-10-CM | POA: Diagnosis not present

## 2016-05-16 DIAGNOSIS — Z7901 Long term (current) use of anticoagulants: Secondary | ICD-10-CM | POA: Diagnosis not present

## 2016-05-16 DIAGNOSIS — I739 Peripheral vascular disease, unspecified: Secondary | ICD-10-CM | POA: Diagnosis present

## 2016-05-16 DIAGNOSIS — K921 Melena: Secondary | ICD-10-CM | POA: Diagnosis not present

## 2016-05-16 DIAGNOSIS — Z9841 Cataract extraction status, right eye: Secondary | ICD-10-CM | POA: Diagnosis not present

## 2016-05-16 DIAGNOSIS — Z961 Presence of intraocular lens: Secondary | ICD-10-CM | POA: Diagnosis present

## 2016-05-16 DIAGNOSIS — I25810 Atherosclerosis of coronary artery bypass graft(s) without angina pectoris: Secondary | ICD-10-CM

## 2016-05-16 DIAGNOSIS — R933 Abnormal findings on diagnostic imaging of other parts of digestive tract: Secondary | ICD-10-CM | POA: Diagnosis not present

## 2016-05-16 DIAGNOSIS — D62 Acute posthemorrhagic anemia: Secondary | ICD-10-CM | POA: Diagnosis not present

## 2016-05-16 DIAGNOSIS — K922 Gastrointestinal hemorrhage, unspecified: Secondary | ICD-10-CM

## 2016-05-16 DIAGNOSIS — I6529 Occlusion and stenosis of unspecified carotid artery: Secondary | ICD-10-CM | POA: Diagnosis present

## 2016-05-16 DIAGNOSIS — K5730 Diverticulosis of large intestine without perforation or abscess without bleeding: Secondary | ICD-10-CM | POA: Diagnosis present

## 2016-05-16 DIAGNOSIS — Z9842 Cataract extraction status, left eye: Secondary | ICD-10-CM | POA: Diagnosis not present

## 2016-05-16 DIAGNOSIS — I25118 Atherosclerotic heart disease of native coronary artery with other forms of angina pectoris: Secondary | ICD-10-CM | POA: Diagnosis not present

## 2016-05-16 DIAGNOSIS — K5790 Diverticulosis of intestine, part unspecified, without perforation or abscess without bleeding: Secondary | ICD-10-CM | POA: Diagnosis not present

## 2016-05-16 DIAGNOSIS — G2581 Restless legs syndrome: Secondary | ICD-10-CM | POA: Diagnosis present

## 2016-05-16 DIAGNOSIS — I5022 Chronic systolic (congestive) heart failure: Secondary | ICD-10-CM | POA: Diagnosis not present

## 2016-05-16 DIAGNOSIS — I482 Chronic atrial fibrillation: Secondary | ICD-10-CM

## 2016-05-16 DIAGNOSIS — I130 Hypertensive heart and chronic kidney disease with heart failure and stage 1 through stage 4 chronic kidney disease, or unspecified chronic kidney disease: Secondary | ICD-10-CM | POA: Diagnosis present

## 2016-05-16 DIAGNOSIS — I272 Pulmonary hypertension, unspecified: Secondary | ICD-10-CM | POA: Diagnosis not present

## 2016-05-16 DIAGNOSIS — K219 Gastro-esophageal reflux disease without esophagitis: Secondary | ICD-10-CM | POA: Diagnosis not present

## 2016-05-16 LAB — CBC WITH DIFFERENTIAL/PLATELET
BASOS ABS: 0 10*3/uL (ref 0.0–0.1)
Basophils Relative: 0 %
EOS ABS: 0.3 10*3/uL (ref 0.0–0.7)
EOS PCT: 5 %
HCT: 19.9 % — ABNORMAL LOW (ref 39.0–52.0)
Hemoglobin: 6.3 g/dL — CL (ref 13.0–17.0)
Lymphocytes Relative: 42 %
Lymphs Abs: 2.4 10*3/uL (ref 0.7–4.0)
MCH: 28 pg (ref 26.0–34.0)
MCHC: 31.7 g/dL (ref 30.0–36.0)
MCV: 88.4 fL (ref 78.0–100.0)
MONO ABS: 0.5 10*3/uL (ref 0.1–1.0)
Monocytes Relative: 9 %
Neutro Abs: 2.5 10*3/uL (ref 1.7–7.7)
Neutrophils Relative %: 44 %
PLATELETS: 237 10*3/uL (ref 150–400)
RBC: 2.25 MIL/uL — ABNORMAL LOW (ref 4.22–5.81)
RDW: 16.6 % — AB (ref 11.5–15.5)
WBC: 5.6 10*3/uL (ref 4.0–10.5)

## 2016-05-16 LAB — URINALYSIS W MICROSCOPIC + REFLEX CULTURE
BACTERIA UA: NONE SEEN [HPF]
BILIRUBIN URINE: NEGATIVE
CRYSTALS: NONE SEEN [HPF]
Casts: NONE SEEN [LPF]
GLUCOSE, UA: NEGATIVE
Ketones, ur: NEGATIVE
Leukocytes, UA: NEGATIVE
Nitrite: NEGATIVE
RBC / HPF: NONE SEEN RBC/HPF (ref <=2)
SQUAMOUS EPITHELIAL / LPF: NONE SEEN [HPF] (ref <=5)
Specific Gravity, Urine: 1.013 (ref 1.001–1.035)
YEAST: NONE SEEN [HPF]
pH: 5.5 (ref 5.0–8.0)

## 2016-05-16 LAB — BASIC METABOLIC PANEL
ANION GAP: 5 (ref 5–15)
BUN: 28 mg/dL — ABNORMAL HIGH (ref 6–20)
CO2: 23 mmol/L (ref 22–32)
Calcium: 8.6 mg/dL — ABNORMAL LOW (ref 8.9–10.3)
Chloride: 113 mmol/L — ABNORMAL HIGH (ref 101–111)
Creatinine, Ser: 1.6 mg/dL — ABNORMAL HIGH (ref 0.61–1.24)
GFR, EST AFRICAN AMERICAN: 45 mL/min — AB (ref >60)
GFR, EST NON AFRICAN AMERICAN: 38 mL/min — AB (ref >60)
Glucose, Bld: 99 mg/dL (ref 65–99)
POTASSIUM: 4.1 mmol/L (ref 3.5–5.1)
SODIUM: 141 mmol/L (ref 135–145)

## 2016-05-16 LAB — COMPREHENSIVE METABOLIC PANEL
ALBUMIN: 2.9 g/dL — AB (ref 3.5–5.0)
ALK PHOS: 59 U/L (ref 38–126)
ALT: 10 U/L — AB (ref 17–63)
AST: 17 U/L (ref 15–41)
Anion gap: 9 (ref 5–15)
BILIRUBIN TOTAL: 0.4 mg/dL (ref 0.3–1.2)
BUN: 31 mg/dL — AB (ref 6–20)
CO2: 25 mmol/L (ref 22–32)
CREATININE: 1.91 mg/dL — AB (ref 0.61–1.24)
Calcium: 8.8 mg/dL — ABNORMAL LOW (ref 8.9–10.3)
Chloride: 107 mmol/L (ref 101–111)
GFR calc Af Amer: 36 mL/min — ABNORMAL LOW (ref >60)
GFR, EST NON AFRICAN AMERICAN: 31 mL/min — AB (ref >60)
GLUCOSE: 96 mg/dL (ref 65–99)
POTASSIUM: 4 mmol/L (ref 3.5–5.1)
Sodium: 141 mmol/L (ref 135–145)
TOTAL PROTEIN: 6.1 g/dL — AB (ref 6.5–8.1)

## 2016-05-16 LAB — IRON AND TIBC
Iron: 33 ug/dL — ABNORMAL LOW (ref 45–182)
SATURATION RATIOS: 11 % — AB (ref 17.9–39.5)
TIBC: 307 ug/dL (ref 250–450)
UIBC: 274 ug/dL

## 2016-05-16 LAB — GLUCOSE, CAPILLARY: Glucose-Capillary: 90 mg/dL (ref 65–99)

## 2016-05-16 LAB — VITAMIN B12: Vitamin B-12: 266 pg/mL (ref 180–914)

## 2016-05-16 LAB — CBC
HCT: 26.2 % — ABNORMAL LOW (ref 39.0–52.0)
HEMOGLOBIN: 8.5 g/dL — AB (ref 13.0–17.0)
MCH: 28 pg (ref 26.0–34.0)
MCHC: 32.4 g/dL (ref 30.0–36.0)
MCV: 86.2 fL (ref 78.0–100.0)
Platelets: 206 10*3/uL (ref 150–400)
RBC: 3.04 MIL/uL — AB (ref 4.22–5.81)
RDW: 15.7 % — AB (ref 11.5–15.5)
WBC: 7.1 10*3/uL (ref 4.0–10.5)

## 2016-05-16 LAB — PREPARE RBC (CROSSMATCH)

## 2016-05-16 LAB — RETICULOCYTES
RBC.: 3.25 MIL/uL — ABNORMAL LOW (ref 4.22–5.81)
Retic Count, Absolute: 94.3 10*3/uL (ref 19.0–186.0)
Retic Ct Pct: 2.9 % (ref 0.4–3.1)

## 2016-05-16 LAB — FOLATE: FOLATE: 9.3 ng/mL (ref >5.9)

## 2016-05-16 LAB — FERRITIN: Ferritin: 38 ng/mL (ref 24–336)

## 2016-05-16 LAB — POC OCCULT BLOOD, ED: FECAL OCCULT BLD: POSITIVE — AB

## 2016-05-16 MED ORDER — ONDANSETRON HCL 4 MG/2ML IJ SOLN
4.0000 mg | Freq: Four times a day (QID) | INTRAMUSCULAR | Status: DC | PRN
  Administered 2016-05-17: 4 mg via INTRAVENOUS
  Filled 2016-05-16: qty 2

## 2016-05-16 MED ORDER — LORAZEPAM 2 MG/ML IJ SOLN
0.5000 mg | Freq: Once | INTRAMUSCULAR | Status: AC
  Administered 2016-05-16: 0.5 mg via INTRAVENOUS
  Filled 2016-05-16: qty 1

## 2016-05-16 MED ORDER — SODIUM CHLORIDE 0.9 % IV SOLN: 250.0000 mL | INTRAVENOUS | Status: DC | PRN

## 2016-05-16 MED ORDER — ONDANSETRON HCL 4 MG PO TABS: 4.0000 mg | ORAL_TABLET | Freq: Four times a day (QID) | ORAL | Status: DC | PRN

## 2016-05-16 MED ORDER — SODIUM CHLORIDE 0.9% FLUSH: 3.0000 mL | INTRAVENOUS | Status: DC | PRN

## 2016-05-16 MED ORDER — SODIUM CHLORIDE 0.9% FLUSH
3.0000 mL | Freq: Two times a day (BID) | INTRAVENOUS | Status: DC
  Administered 2016-05-16: 3 mL via INTRAVENOUS

## 2016-05-16 NOTE — Progress Notes (Signed)
PROGRESS NOTE  Gregory [name]  HKV:[id number] DOB: 03-Jan-1933 DOA: 05/15/2016 PCP: Lamar Blinks, MD Outpatient Specialists:  Subjective: Patient was sleepy, just receive Ativan, seen with his wife and daughter at bedside. Hemoglobin down to 6.3, up to 8.5 after transfusion of 2 units. GI to evaluate. Positive FOBT  Brief Narrative:  Gregory [name] is a 83 y.o. male with medical history significant of CAD, PVD, chronic afib on xaralto comes in with a week of progressive worsening sob , doe, dizziness upon standing, weakness upon standing.  Denies any LOC.  Denies any abdominal pain.  Denies any n/v, no melena or brbpr.  Pt has h/o EGD about 2 years ago for dilation of an esophageal stricture.  He denies any heartburn chronically but is on protonix daily.  He went to see his PCP yesterday who checked labs, his hgb was low so he was called to come to ED.  His hgb 5 months ago was over 11, today it is less than 7.  Referred for admission for GIB.  Assessment & Plan:   Principal Problem:   GIB (gastrointestinal bleeding) Active Problems:   CAD (coronary artery disease),CABG 1993-LIMA to the LAD, SVG to Om and SVG to PDA/PLA, patent on cath 2014.   Chronic a-fib ([facility])   PVD - 56% LICA, 43% RICA by doppler 5/14   Anticoagulated on Xarelto   Coronary artery disease involving native coronary artery   Chronic renal failure, stage 3 (moderate)   Patient seen and examined, and data base reviewed, patient admitted earlier today by my colleague Dr. Shanon Brow. This is a no charge note.    GIB (gastrointestinal bleeding) hgb was 11 5 months ago, likely ugib, place on protonix drip.  Transfuse 2 units tonight.  Keep npo after midnight.  Will need to call GI in the am for GI work up of his bleeding  Active Problems:   CAD (coronary artery disease),CABG 1993-LIMA to the LAD, SVG to Om and SVG to PDA/PLA, patent on cath 2014.- noted, holding all blood thinners   Chronic a-fib ([place])- rate controlled,  holding blood thinners   PVD - 32% LICA, 95% RICA by doppler 5/14- same as above   Anticoagulated on Xarelto- holding   Coronary artery disease involving native coronary artery- as above   Chronic renal failure, stage 3 (moderate)- stable at baseline Cr of 1.8 to 2 range      DVT prophylaxis:  Code Status: Full Code Family Communication:  Disposition Plan:  Diet: Diet NPO time specified  Consultants:   GI  Procedures:   None  Antimicrobials:   None   Objective: Vitals:   05/16/16 0351 05/16/16 0426 05/16/16 0445 05/16/16 0745  BP: (!) 159/81 (!) 130/58 (!) 146/53 (!) 143/60  Pulse: 74 65 66 (!) 59  Resp: 15 18 18    Temp: 97.4 F (36.3 C) 98 F (36.7 C) 98 F (36.7 C) 98.3 F (36.8 C)  TempSrc: Oral Oral Oral Oral  SpO2: 92% 92% 98% 96%  Weight: 88.5 kg (195 lb 1.7 oz)     Height: 5\' 6"  (1.676 m)       Intake/Output Summary (Last 24 hours) at 05/16/16 1259 Last data filed at 05/16/16 0944  Gross per 24 hour  Intake           762.08 ml  Output              650 ml  Net           112.08 ml  Filed Weights   05/16/16 0351  Weight: 88.5 kg (195 lb 1.7 oz)    Examination: General exam: Appears calm and comfortable  Respiratory system: Clear to auscultation. Respiratory effort normal. Cardiovascular system: S1 & S2 heard, RRR. No JVD, murmurs, rubs, gallops or clicks. No pedal edema. Gastrointestinal system: Abdomen is nondistended, soft and nontender. No organomegaly or masses felt. Normal bowel sounds heard. Central nervous system: Alert and oriented. No focal neurological deficits. Extremities: Symmetric 5 x 5 power. Skin: No rashes, lesions or ulcers Psychiatry: Judgement and insight appear normal. Mood & affect appropriate.   Data Reviewed: I have personally reviewed following labs and imaging studies  CBC:  Recent Labs Lab 05/15/16 1631 05/15/16 2324 05/15/16 2335 05/16/16 1004  WBC 6.9 5.6  --  7.1  NEUTROABS 3,381 2.5  --   --   HGB 6.7*  6.3* 6.5* 8.5*  HCT 20.9* 19.9* 19.0* 26.2*  MCV 86.0 88.4  --  86.2  PLT 242 237  --  425   Basic Metabolic Panel:  Recent Labs Lab 05/15/16 1631 05/15/16 2324 05/15/16 2335 05/16/16 1004  NA 140 141 140 141  K 4.8 4.0 3.9 4.1  CL 107 107 109 113*  CO2 25 25  --  23  GLUCOSE 117* 96 92 99  BUN 33* 31* 32* 28*  CREATININE 1.56* 1.91* 1.90* 1.60*  CALCIUM 8.6 8.8*  --  8.6*   GFR: Estimated Creatinine Clearance: 37.1 mL/min (by C-G formula based on SCr of 1.6 mg/dL (H)). Liver Function Tests:  Recent Labs Lab 05/15/16 1631 05/15/16 2324  AST 14 17  ALT 8* 10*  ALKPHOS 63 59  BILITOT 0.5 0.4  PROT 6.2 6.1*  ALBUMIN 3.3* 2.9*   No results for input(s): LIPASE, AMYLASE in the last 168 hours. No results for input(s): AMMONIA in the last 168 hours. Coagulation Profile: No results for input(s): INR, PROTIME in the last 168 hours. Cardiac Enzymes: No results for input(s): CKTOTAL, CKMB, CKMBINDEX, TROPONINI in the last 168 hours. BNP (last 3 results) No results for input(s): PROBNP in the last 8760 hours. HbA1C: No results for input(s): HGBA1C in the last 72 hours. CBG: No results for input(s): GLUCAP in the last 168 hours. Lipid Profile: No results for input(s): CHOL, HDL, LDLCALC, TRIG, CHOLHDL, LDLDIRECT in the last 72 hours. Thyroid Function Tests:  Recent Labs  05/15/16 1631  TSH 3.18   Anemia Panel:  Recent Labs  05/16/16 0043  RETICCTPCT 2.9   Urine analysis:    Component Value Date/Time   COLORURINE YELLOW 05/15/2016 [place] 05/15/2016 1631   LABSPEC 1.013 05/15/2016 1631   PHURINE 5.5 05/15/2016 1631   GLUCOSEU NEGATIVE 05/15/2016 1631   HGBUR 1+ (A) 05/15/2016 1631   BILIRUBINUR NEGATIVE 05/15/2016 1631   BILIRUBINUR negative 01/20/2013 1623   KETONESUR NEGATIVE 05/15/2016 1631   PROTEINUR 1+ (A) 05/15/2016 1631   UROBILINOGEN 0.2 01/20/2013 1623   UROBILINOGEN 1.0 03/30/2011 2224   NITRITE NEGATIVE 05/15/2016 1631    LEUKOCYTESUR NEGATIVE 05/15/2016 1631   Sepsis Labs: @LABRCNTIP (procalcitonin:4,lacticidven:4)  )No results found for this or any previous visit (from the past 240 hour(s)).   Invalid input(s): PROCALCITONIN, LACTICACIDVEN   Radiology Studies: Dg Chest 2 View  Result Date: 05/15/2016 CLINICAL DATA:  Shortness of breath for 3 days EXAM: CHEST  2 VIEW COMPARISON:  08/09/15 FINDINGS: Cardiac shadow remains enlarged. Postsurgical changes are noted. The lungs are clear bilaterally. No acute bony abnormality is seen. IMPRESSION: No acute abnormality  noted. Electronically Signed   By: Inez Catalina M.D.   On: 05/15/2016 17:11   Ct Head Wo Contrast  Result Date: 05/15/2016 CLINICAL DATA:  Dizziness starting yesterday, possible CVA EXAM: CT HEAD WITHOUT CONTRAST TECHNIQUE: Contiguous axial images were obtained from the base of the skull through the vertex without intravenous contrast. COMPARISON:  07/30/2015 FINDINGS: Brain: No intracranial hemorrhage, mass effect or midline shift. Stable cerebral atrophy. Stable periventricular and patchy subcortical chronic white matter disease. Old right basal ganglia infarct is stable. No definite acute cortical infarction. No mass lesion is noted on this unenhanced scan. Ventricular size is stable from prior exam. Vascular: Atherosclerotic calcifications of carotid siphon again noted. Skull: No skull fracture is noted. Sinuses/Orbits: No paranasal sinuses air-fluid levels. The mastoid air cells are unremarkable. Other: None IMPRESSION: No acute intracranial abnormality. Stable old infarct in right basal ganglia. Stable atrophy and chronic white matter disease. No definite acute cortical infarction. No mass lesion is noted on this unenhanced scan. Electronically Signed   By: Lahoma Crocker M.D.   On: 05/15/2016 17:13        Scheduled Meds: . sodium chloride flush  3 mL Intravenous Q12H   Continuous Infusions: . pantoprozole (PROTONIX) infusion 8 mg/hr (05/16/16 1100)       LOS: 0 days    Time spent: 35 minutes    Shayden Gingrich A, MD [facility] Pager [phone]  If 7PM-7AM, please contact night-coverage [email or web address] Password [facility] 05/16/2016, 12:59 PM

## 2016-05-16 NOTE — ED Notes (Signed)
Attempted to call report. No answer.

## 2016-05-16 NOTE — Telephone Encounter (Signed)
[name] called back yesterday and we spoke- she let me know that she is taking her dad in to see Melissa in the office as he is not feeling well. Told her that I agree this is a good idea

## 2016-05-16 NOTE — Consult Note (Signed)
CROSS COVER [facility] Reason for Consult: Rectal bleeding/anemia. Referring Physician: THP  Gregory [name] is an 83 y.o. male.  HPI: [name] is a 83 year old white male with multiple medical problems including coronary artery disease peripheral vascular disease chronic atrial fibrillation on Zaroxolyn who comes into the hospital with a one-week history of progressive shortness of breath dyspnea or dyspnea on exertion dizziness and weakness. He denies having any abdominal pain nausea vomiting. He has a long-standing history of reflux and takes Protonix on a daily basis. He was seen by his PCP yesterday and sent to the hospital for a drop in his hemoglobin down to 6.3 g/dL. His hemoglobin was about 11 g/dL 5 months ago. He denies a history of nonsteroidal use. He's never had a colonoscopy. He had an EGD last year benign-appearing esophageal stricture was dilated and a small hiatal hernia was noted. In 2013 he had a foot impaction when he had an emergent EGD done by Dr. Carol [name] and 1 distal esophagitis was noted. He took his last dose of Xarelto yesterday. There is some question of rectal bleeding after bowel movement today but this is not documented by his nurse. He is not aware of any melena or hematochezia while he was at home.   Past Medical History:  Diagnosis Date  . Appendicitis with abscess 03/31/2011  . Arthritis   . CAD (coronary artery disease),CABG 1993-LIMA to the LAD, SVG to Om and SVG to PDA/PLA, patent on cath 2008. 2003   a. s/p CABG 2003. b. last cath 2008 with patent grafts.  . Carotid artery disease ([place])    a. Severe - pt refused intervention in the past.  . Chronic atrial fibrillation ([place])    permanent  . Chronic systolic CHF (congestive heart failure) ([place])    a. Prior low EF, later normalized.  . Essential hypertension   . GERD (gastroesophageal reflux disease)   . Heart attack 1980's  . Heart murmur   . Hyperlipidemia   . Ischemic cardiomyopathy   . Kidney  stones    "passed all but one time when he had to have lithotripsy" (01/21/2013)  . Pulmonary hypertension   . Renal infarct ([place])    a. 07/2015 - after holding Xarelto x 3 days for spinal procedure.  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 SSS (sick sinus syndrome) ([place])   . Stroke [facility]) 2007   "slightly drags left foot since; recovered qthing else" (01/21/2013)  . Syncope 07/2015   a. felt vasovagal in setting of pain from renal infarct.   Past Surgical History:  Procedure Laterality Date  . BALLOON DILATION N/A 05/22/2015   Procedure: BALLOON DILATION;  Surgeon: Gatha Mayer, MD;  Location: [facility];  Service: Endoscopy;  Laterality: N/A;  . CARDIAC CATHETERIZATION  02/24/2002   reduced LV function, 60-70% prox RCA stenosis, 70% PLA ostial stenosis, 80% secondary branch of PLA stenosis - subsequent CABG (Dr. Jackie [name])  . Carotid Doppler  06/2012   50-69% right bulb/prox ICA diameter reduction; 70-99% left bulb/prox ICA diameter reduction  . CATARACT EXTRACTION W/ INTRAOCULAR LENS IMPLANT Bilateral 2013  . CORONARY ANGIOPLASTY  07/05/2006   3 vessel CAD, patent LIMA to LAD, patentVG to OM, patent SVG to PDA & PLA, mild MR, severe LV systolic dysfunction (Dr. Gerrie [name])  . CORONARY ARTERY BYPASS GRAFT  03/01/2002   LIMA to LAD, reverse SVG to OM, reverse SVG to PDA of RCA, reverse SVG to PLA of RCA, ligation of LA appendage (Dr.  Gerhardt)  . ESOPHAGOGASTRODUODENOSCOPY  02/01/2012   Procedure: ESOPHAGOGASTRODUODENOSCOPY (EGD);  Surgeon: Beryle Beams, MD;  Location: [facility];  Service: Endoscopy;  Laterality: N/A;  . ESOPHAGOGASTRODUODENOSCOPY N/A 05/22/2015   Procedure: ESOPHAGOGASTRODUODENOSCOPY (EGD);  Surgeon: Gatha Mayer, MD;  Location: [facility];  Service: Endoscopy;  Laterality: N/A;  . LAPAROSCOPIC APPENDECTOMY  03/30/2011   Procedure: APPENDECTOMY LAPAROSCOPIC;  Surgeon: Joyice Faster. Cornett, MD;  Location: [facility];  Service: General;  Laterality: N/A;  . LEFT HEART CATHETERIZATION  WITH CORONARY ANGIOGRAM N/A 01/24/2013   Procedure: LEFT HEART CATHETERIZATION WITH CORONARY ANGIOGRAM;  Surgeon: [name], MD;  Location: [facility] CATH LAB;  Service: Cardiovascular;  Laterality: N/A;  Right heart with grafts  . LITHOTRIPSY     "once" (01/21/2013)  . NM MYOCAR PERF WALL MOTION  05/2009   persantine myoview - fixed moderate perfusion defect in inferior wall & lateral segment of apex (poor non-transmural infarction), minimal anterolateral periinfarct reversible ischemia seen, abnormal study, defects similar to 2006 study  . SPLENECTOMY, TOTAL  01/2013  . TEE WITHOUT CARDIOVERSION N/A 01/23/2013   Procedure: TRANSESOPHAGEAL ECHOCARDIOGRAM (TEE);  Surgeon: Sanda Klein, MD;  Location: [facility] ENDOSCOPY;  Service: Cardiovascular;  Laterality: N/A;  . TRANSTHORACIC ECHOCARDIOGRAM  06/23/2012   EF 40-45%, mild LVH; mild AV regurg; mild MV regurg; LV mod-severely dilated; RV mildly dilated; systolic pressure borderline increased; RA mod dilated   Family History  Problem Relation Age of Onset  . Heart disease Father     rheumatic fever  . Heart attack Brother 2  . Stroke Mother   . Stroke Brother 16   Social History:  reports that he quit smoking about 30 years ago. His smoking use included Cigarettes. He has a 40.00 pack-year smoking history. His smokeless tobacco use includes Chew. He reports that he does not drink alcohol or use drugs.  Allergies:  Allergies  Allergen Reactions  . Percocet [Oxycodone-Acetaminophen] Itching and Nausea Only   Medications: I have reviewed the patient's current medications.  Results for orders placed or performed during the hospital encounter of 05/15/16 (from the past 48 hour(s))  Prepare RBC     Status: None   Collection Time: 05/15/16 11:14 PM  Result Value Ref Range   Order Confirmation ORDER PROCESSED BY BLOOD BANK   Type and screen     Status: None (Preliminary result)   Collection Time: 05/15/16 11:14 PM  Result Value Ref Range    ABO/RH(D) O NEG    Antibody Screen NEG    Sample Expiration 05/18/2016    Unit Number [id number]    Blood Component Type RED CELLS,LR    Unit division 00    Status of Unit ISSUED    Transfusion Status OK TO TRANSFUSE    Crossmatch Result Compatible    Unit Number [id number]    Blood Component Type RED CELLS,LR    Unit division 00    Status of Unit ISSUED    Transfusion Status OK TO TRANSFUSE    Crossmatch Result Compatible   Comprehensive metabolic panel     Status: Abnormal   Collection Time: 05/15/16 11:24 PM  Result Value Ref Range   Sodium 141 135 - 145 mmol/L   Potassium 4.0 3.5 - 5.1 mmol/L   Chloride 107 101 - 111 mmol/L   CO2 25 22 - 32 mmol/L   Glucose, Bld 96 65 - 99 mg/dL   BUN 31 (H) 6 - 20 mg/dL   Creatinine, Ser 1.91 (H) 0.61 - 1.24 mg/dL  Calcium 8.8 (L) 8.9 - 10.3 mg/dL   Total Protein 6.1 (L) 6.5 - 8.1 g/dL   Albumin 2.9 (L) 3.5 - 5.0 g/dL   AST 17 15 - 41 U/L   ALT 10 (L) 17 - 63 U/L   Alkaline Phosphatase 59 38 - 126 U/L   Total Bilirubin 0.4 0.3 - 1.2 mg/dL   GFR calc non Af Amer 31 (L) >60 mL/min   GFR calc Af Amer 36 (L) >60 mL/min    Comment: (NOTE) The eGFR has been calculated using the CKD EPI equation. This calculation has not been validated in all clinical situations. eGFR's persistently <60 mL/min signify possible Chronic Kidney Disease.    Anion gap 9 5 - 15  CBC WITH DIFFERENTIAL     Status: Abnormal   Collection Time: 05/15/16 11:24 PM  Result Value Ref Range   WBC 5.6 4.0 - 10.5 K/uL   RBC 2.25 (L) 4.22 - 5.81 MIL/uL   Hemoglobin 6.3 (LL) 13.0 - 17.0 g/dL    Comment: REPEATED TO VERIFY CRITICAL RESULT CALLED TO, READ BACK BY AND VERIFIED WITH: K.NEWNAM,RN 0008 05/16/16 M.CAMPBELL    HCT 19.9 (L) 39.0 - 52.0 %   MCV 88.4 78.0 - 100.0 fL   MCH 28.0 26.0 - 34.0 pg   MCHC 31.7 30.0 - 36.0 g/dL   RDW 16.6 (H) 11.5 - 15.5 %   Platelets 237 150 - 400 K/uL   Neutrophils Relative % 44 %   Neutro Abs 2.5 1.7 - 7.7 K/uL    Lymphocytes Relative 42 %   Lymphs Abs 2.4 0.7 - 4.0 K/uL   Monocytes Relative 9 %   Monocytes Absolute 0.5 0.1 - 1.0 K/uL   Eosinophils Relative 5 %   Eosinophils Absolute 0.3 0.0 - 0.7 K/uL   Basophils Relative 0 %   Basophils Absolute 0.0 0.0 - 0.1 K/uL  I-Stat Chem 8, ED  (not at [facility], [facility])     Status: Abnormal   Collection Time: 05/15/16 11:35 PM  Result Value Ref Range   Sodium 140 135 - 145 mmol/L   Potassium 3.9 3.5 - 5.1 mmol/L   Chloride 109 101 - 111 mmol/L   BUN 32 (H) 6 - 20 mg/dL   Creatinine, Ser 1.90 (H) 0.61 - 1.24 mg/dL   Glucose, Bld 92 65 - 99 mg/dL   Calcium, Ion 1.12 (L) 1.15 - 1.40 mmol/L   TCO2 25 0 - 100 mmol/L   Hemoglobin 6.5 (LL) 13.0 - 17.0 g/dL   HCT 19.0 (L) 39.0 - 52.0 %   Comment NOTIFIED PHYSICIAN   POC occult blood, ED RN will collect     Status: Abnormal   Collection Time: 05/16/16 12:19 AM  Result Value Ref Range   Fecal Occult Bld POSITIVE (A) NEGATIVE  Basic metabolic panel     Status: Abnormal   Collection Time: 05/16/16 10:04 AM  Result Value Ref Range   Sodium 141 135 - 145 mmol/L   Potassium 4.1 3.5 - 5.1 mmol/L   Chloride 113 (H) 101 - 111 mmol/L   CO2 23 22 - 32 mmol/L   Glucose, Bld 99 65 - 99 mg/dL   BUN 28 (H) 6 - 20 mg/dL   Creatinine, Ser 1.60 (H) 0.61 - 1.24 mg/dL   Calcium 8.6 (L) 8.9 - 10.3 mg/dL   GFR calc non Af Amer 38 (L) >60 mL/min   GFR calc Af Amer 45 (L) >60 mL/min    Comment: (NOTE) The eGFR has been  calculated using the CKD EPI equation. This calculation has not been validated in all clinical situations. eGFR's persistently <60 mL/min signify possible Chronic Kidney Disease.    Anion gap 5 5 - 15  CBC     Status: Abnormal   Collection Time: 05/16/16 10:04 AM  Result Value Ref Range   WBC 7.1 4.0 - 10.5 K/uL   RBC 3.04 (L) 4.22 - 5.81 MIL/uL   Hemoglobin 8.5 (L) 13.0 - 17.0 g/dL    Comment: POST TRANSFUSION SPECIMEN   HCT 26.2 (L) 39.0 - 52.0 %   MCV 86.2 78.0 - 100.0 fL   MCH 28.0 26.0 - 34.0 pg    MCHC 32.4 30.0 - 36.0 g/dL   RDW 15.7 (H) 11.5 - 15.5 %   Platelets 206 150 - 400 K/uL    Dg Chest 2 View  Result Date: 05/15/2016 CLINICAL DATA:  Shortness of breath for 3 days EXAM: CHEST  2 VIEW COMPARISON:  08/09/15 FINDINGS: Cardiac shadow remains enlarged. Postsurgical changes are noted. The lungs are clear bilaterally. No acute bony abnormality is seen. IMPRESSION: No acute abnormality noted. Electronically Signed   By: Inez Catalina M.D.   On: 05/15/2016 17:11   Ct Head Wo Contrast  Result Date: 05/15/2016 CLINICAL DATA:  Dizziness starting yesterday, possible CVA EXAM: CT HEAD WITHOUT CONTRAST TECHNIQUE: Contiguous axial images were obtained from the base of the skull through the vertex without intravenous contrast. COMPARISON:  07/30/2015 FINDINGS: Brain: No intracranial hemorrhage, mass effect or midline shift. Stable cerebral atrophy. Stable periventricular and patchy subcortical chronic white matter disease. Old right basal ganglia infarct is stable. No definite acute cortical infarction. No mass lesion is noted on this unenhanced scan. Ventricular size is stable from prior exam. Vascular: Atherosclerotic calcifications of carotid siphon again noted. Skull: No skull fracture is noted. Sinuses/Orbits: No paranasal sinuses air-fluid levels. The mastoid air cells are unremarkable. Other: None IMPRESSION: No acute intracranial abnormality. Stable old infarct in right basal ganglia. Stable atrophy and chronic white matter disease. No definite acute cortical infarction. No mass lesion is noted on this unenhanced scan. Electronically Signed   By: Lahoma Crocker M.D.   On: 05/15/2016 17:13   ROS Blood pressure (!) 143/60, pulse (!) 59, temperature 98.3 F (36.8 C), temperature source Oral, resp. rate 18, height '5\' 6"'$  (1.676 m), weight 88.5 kg (195 lb 1.7 oz), SpO2 96 %. Physical Exam  Assessment/Plan: 1) Severe anemia with rectal bleeding/chronic GERD-plans are to do EGD tomorrow. It may be helpful  to do the colonoscopy while the patient is in the hospital prior to discharge as it would be difficult to have him come back for procedure at a later date. 2) CAD/chronic atrial fibrillation/PVD on Xarelto.  3) Chronic renal disease.  Yareliz Thorstenson 05/16/2016, 11:31 AM

## 2016-05-16 NOTE — H&P (Signed)
History and Physical    Gregory [name] Gregory [name]:[id number] DOB: Mar 14, 1933 DOA: 05/15/2016  PCP: Lamar Blinks, MD  Patient coming from: home  Chief Complaint:  Weak, sob  HPI: Gregory [name] is a 83 y.o. male with medical history significant of CAD, PVD, chronic afib on xaralto comes in with a week of progressive worsening sob , doe, dizziness upon standing, weakness upon standing.  Denies any LOC.  Denies any abdominal pain.  Denies any n/v, no melena or brbpr.  Pt has h/o EGD about 2 years ago for dilation of an esophageal stricture.  He denies any heartburn chronically but is on protonix daily.  He went to see his PCP yesterday who checked labs, his hgb was low so he was called to come to ED.  His hgb 5 months ago was over 11, today it is less than 7.  Referred for admission for GIB.   Review of Systems: As per HPI otherwise 10 point review of systems negative.   Past Medical History:  Diagnosis Date  . Appendicitis with abscess 03/31/2011  . Arthritis   . CAD (coronary artery disease),CABG 1993-LIMA to the LAD, SVG to Om and SVG to PDA/PLA, patent on cath 2008. 2003   a. s/p CABG 2003. b. last cath 2008 with patent grafts.  . Carotid artery disease ([place])    a. Severe - pt refused intervention in the past.  . Chronic atrial fibrillation ([place])    permanent  . Chronic systolic CHF (congestive heart failure) ([place])    a. Prior low EF, later normalized.  . Essential hypertension   . GERD (gastroesophageal reflux disease)   . Heart attack 1980's  . Heart murmur   . Hyperlipidemia   . Ischemic cardiomyopathy   . Kidney stones    "passed all but one time when he had to have lithotripsy" (01/21/2013)  . Pulmonary hypertension   . Renal infarct ([place])    a. 07/2015 - after holding Xarelto x 3 days for spinal procedure.  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 SSS (sick sinus syndrome) ([place])   . Stroke [facility]) 2007   "slightly drags left foot since; recovered qthing else" (01/21/2013)  . Syncope  07/2015   a. felt vasovagal in setting of pain from renal infarct.    Past Surgical History:  Procedure Laterality Date  . BALLOON DILATION N/A 05/22/2015   Procedure: BALLOON DILATION;  Surgeon: Gatha Mayer, MD;  [facility];  Service: Endoscopy;  Laterality: N/A;  . CARDIAC CATHETERIZATION  02/24/2002   reduced LV function, 60-70% prox RCA stenosis, 70% PLA ostial stenosis, 80% secondary branch of PLA stenosis - subsequent CABG (Dr. Jackie Plum)  . Carotid Doppler  06/2012   50-69% right bulb/prox ICA diameter reduction; 70-99% left bulb/prox ICA diameter reduction  . CATARACT EXTRACTION W/ INTRAOCULAR LENS IMPLANT Bilateral 2013  . CORONARY ANGIOPLASTY  07/05/2006   3 vessel CAD, patent LIMA to LAD, patentVG to OM, patent SVG to PDA & PLA, mild MR, severe LV systolic dysfunction (Dr. Gerrie Nordmann)  . CORONARY ARTERY BYPASS GRAFT  03/01/2002   LIMA to LAD, reverse SVG to OM, reverse SVG to PDA of RCA, reverse SVG to PLA of RCA, ligation of LA appendage (Dr. Servando Snare)  . ESOPHAGOGASTRODUODENOSCOPY  02/01/2012   Procedure: ESOPHAGOGASTRODUODENOSCOPY (EGD);  Surgeon: Beryle Beams, MD;  Location: [facility];  Service: Endoscopy;  Laterality: N/A;  . ESOPHAGOGASTRODUODENOSCOPY N/A 05/22/2015   Procedure: ESOPHAGOGASTRODUODENOSCOPY (EGD);  Surgeon: Gatha Mayer, MD;  Location: [facility];  Service: Endoscopy;  Laterality: N/A;  . LAPAROSCOPIC APPENDECTOMY  03/30/2011   Procedure: APPENDECTOMY LAPAROSCOPIC;  Surgeon: Joyice Faster. Cornett, MD;  Location: [facility];  Service: General;  Laterality: N/A;  . LEFT HEART CATHETERIZATION WITH CORONARY ANGIOGRAM N/A 01/24/2013   Procedure: LEFT HEART CATHETERIZATION WITH CORONARY ANGIOGRAM;  Surgeon: [name], MD;  Location: [facility] CATH LAB;  Service: Cardiovascular;  Laterality: N/A;  Right heart with grafts  . LITHOTRIPSY     "once" (01/21/2013)  . NM MYOCAR PERF WALL MOTION  05/2009   persantine myoview - fixed moderate perfusion defect in  inferior wall & lateral segment of apex (poor non-transmural infarction), minimal anterolateral periinfarct reversible ischemia seen, abnormal study, defects similar to 2006 study  . SPLENECTOMY, TOTAL  01/2013  . TEE WITHOUT CARDIOVERSION N/A 01/23/2013   Procedure: TRANSESOPHAGEAL ECHOCARDIOGRAM (TEE);  Surgeon: Sanda Klein, MD;  Location: [facility] ENDOSCOPY;  Service: Cardiovascular;  Laterality: N/A;  . TRANSTHORACIC ECHOCARDIOGRAM  06/23/2012   EF 40-45%, mild LVH; mild AV regurg; mild MV regurg; LV mod-severely dilated; RV mildly dilated; systolic pressure borderline increased; RA mod dilated     reports that he quit smoking about 30 years ago. His smoking use included Cigarettes. He has a 40.00 pack-year smoking history. His smokeless tobacco use includes Chew. He reports that he does not drink alcohol or use drugs.  Allergies  Allergen Reactions  . Percocet [Oxycodone-Acetaminophen] Itching and Nausea Only    Family History  Problem Relation Age of Onset  . Heart disease Father     rheumatic fever  . Heart attack Brother 58  . Stroke Mother   . Stroke Brother 50    Prior to Admission medications   Medication Sig Start Date End Date Taking? Authorizing Provider  aspirin EC 81 MG tablet Take 81 mg by mouth daily.    Historical Provider, MD  carvedilol (COREG) 3.125 MG tablet Take 1 tablet (3.125 mg total) by mouth 2 (two) times daily with a meal. Patient taking differently: Take 3.125 mg by mouth 2 times daily at 12 noon and 4 pm. 3 tablets 2X DAILY 12/12/15   Troy Sine, MD  furosemide (LASIX) 40 MG tablet TAKE 1/2 TABLET(20 MG) BY MOUTH DAILY 04/15/16   Troy Sine, MD  lisinopril (PRINIVIL,ZESTRIL) 10 MG tablet Take 0.5 tablets (5 mg total) by mouth daily. Patient taking differently: Take 10 mg by mouth daily. 1 WHOLE TABLET DAILY 01/16/16   Troy Sine, MD  pantoprazole (PROTONIX) 40 MG tablet TAKE 1 TABLET BY MOUTH EVERY DAY 01/10/16   Troy Sine, MD  Rivaroxaban  (XARELTO) 15 MG TABS tablet Take 1 tablet (15 mg total) by mouth daily with supper. 01/16/16   Troy Sine, MD  rOPINIRole (REQUIP) 0.25 MG tablet Take 1 tablet (0.25 mg total) by mouth 2 (two) times daily. 05/15/16   Debbrah Alar, NP  rosuvastatin (CRESTOR) 40 MG tablet TAKE 1 TABLET(40 MG) BY MOUTH DAILY 12/10/15   Troy Sine, MD  traMADol (ULTRAM) 50 MG tablet TAKE ONE-HALF TABLET BY MOUTH EVERY 8 HOURS AS NEEDED FOR PAIN 03/27/16   Darreld Mclean, MD    Physical Exam: Vitals:   05/15/16 2252 05/15/16 2256 05/15/16 2300  BP:  130/56 (!) 128/52  Pulse:  60 (!) 46  Resp:  16 14  Temp:  97.5 F (36.4 C)   TempSrc:  Oral   SpO2: 100% 100% 100%    Constitutional: NAD, calm, comfortable, pale Vitals:  05/15/16 2252 05/15/16 2256 05/15/16 2300  BP:  130/56 (!) 128/52  Pulse:  60 (!) 46  Resp:  16 14  Temp:  97.5 F (36.4 C)   TempSrc:  Oral   SpO2: 100% 100% 100%   Eyes: PERRL, lids and conjunctivae normal ENMT: Mucous membranes are moist. Posterior pharynx clear of any exudate or lesions.Normal dentition.  Neck: normal, supple, no masses, no thyromegaly Respiratory: clear to auscultation bilaterally, no wheezing, no crackles. Normal respiratory effort. No accessory muscle use.  Cardiovascular: Regular rate and rhythm, no murmurs / rubs / gallops. No extremity edema. 2+ pedal pulses. No carotid bruits.  Abdomen: no tenderness, no masses palpated. No hepatosplenomegaly. Bowel sounds positive.  Musculoskeletal: no clubbing / cyanosis. No joint deformity upper and lower extremities. Good ROM, no contractures. Normal muscle tone.  Skin: no rashes, lesions, ulcers. No induration Neurologic: CN 2-12 grossly intact. Sensation intact, DTR normal. Strength 5/5 in all 4.  Psychiatric: Normal judgment and insight. Alert and oriented x 3. Normal mood.    Labs on Admission: I have personally reviewed following labs and imaging studies  CBC:  Recent Labs Lab 05/15/16 1631  05/15/16 2324 05/15/16 2335  WBC 6.9 5.6  --   NEUTROABS 3,381 2.5  --   HGB 6.7* 6.3* 6.5*  HCT 20.9* 19.9* 19.0*  MCV 86.0 88.4  --   PLT 242 237  --    Basic Metabolic Panel:  Recent Labs Lab 05/15/16 1631 05/15/16 2335  NA 140 140  K 4.8 3.9  CL 107 109  CO2 25  --   GLUCOSE 117* 92  BUN 33* 32*  CREATININE 1.56* 1.90*  CALCIUM 8.6  --    GFR: Estimated Creatinine Clearance: 31.1 mL/min (by C-G formula based on SCr of 1.9 mg/dL (H)). Liver Function Tests:  Recent Labs Lab 05/15/16 1631  AST 14  ALT 8*  ALKPHOS 63  BILITOT 0.5  PROT 6.2  ALBUMIN 3.3*   Thyroid Function Tests:  Recent Labs  05/15/16 1631  TSH 3.18   Radiological Exams on Admission: Dg Chest 2 View  Result Date: 05/15/2016 CLINICAL DATA:  Shortness of breath for 3 days EXAM: CHEST  2 VIEW COMPARISON:  08/09/15 FINDINGS: Cardiac shadow remains enlarged. Postsurgical changes are noted. The lungs are clear bilaterally. No acute bony abnormality is seen. IMPRESSION: No acute abnormality noted. Electronically Signed   By: Inez Catalina M.D.   On: 05/15/2016 17:11   Ct Head Wo Contrast  Result Date: 05/15/2016 CLINICAL DATA:  Dizziness starting yesterday, possible CVA EXAM: CT HEAD WITHOUT CONTRAST TECHNIQUE: Contiguous axial images were obtained from the base of the skull through the vertex without intravenous contrast. COMPARISON:  07/30/2015 FINDINGS: Brain: No intracranial hemorrhage, mass effect or midline shift. Stable cerebral atrophy. Stable periventricular and patchy subcortical chronic white matter disease. Old right basal ganglia infarct is stable. No definite acute cortical infarction. No mass lesion is noted on this unenhanced scan. Ventricular size is stable from prior exam. Vascular: Atherosclerotic calcifications of carotid siphon again noted. Skull: No skull fracture is noted. Sinuses/Orbits: No paranasal sinuses air-fluid levels. The mastoid air cells are unremarkable. Other: None  IMPRESSION: No acute intracranial abnormality. Stable old infarct in right basal ganglia. Stable atrophy and chronic white matter disease. No definite acute cortical infarction. No mass lesion is noted on this unenhanced scan. Electronically Signed   By: Lahoma Crocker M.D.   On: 05/15/2016 17:13    Assessment/Plan 83 yo male with symptomatic anemia, melena,  heme pos stool  Principal Problem:   GIB (gastrointestinal bleeding) hgb was 11 5 months ago, likely ugib, place on protonix drip.  Transfuse 2 units tonight.  Keep npo after midnight.  Will need to call GI in the am for GI work up of his bleeding  Active Problems:   CAD (coronary artery disease),CABG 1993-LIMA to the LAD, SVG to Om and SVG to PDA/PLA, patent on cath 2014.- noted, holding all blood thinners   Chronic a-fib ([place])- rate controlled, holding blood thinners   PVD - 61% LICA, 53% RICA by doppler 5/14- same as above   Anticoagulated on Xarelto- holding   Coronary artery disease involving native coronary artery- as above   Chronic renal failure, stage 3 (moderate)- stable at baseline Cr of 1.8 to 2 range    DVT prophylaxis:  scds  Code Status:  full Family Communication:  none Disposition Plan:  Per day team Consults called:  none Admission status:  observation   DAVID,RACHAL A MD [facility]  If 7PM-7AM, please contact night-coverage [email or web address] Password [facility]  05/16/2016, 12:37 AM

## 2016-05-16 NOTE — ED Notes (Signed)
Labels sent to main lab for add on

## 2016-05-16 NOTE — Progress Notes (Signed)
Spouse reported that patient had a bloody liquid bm but flushed it before notifying RN. VS checked with no change noted . Paient has no complains . Will do strict I and O's and monitor stool closely.

## 2016-05-17 ENCOUNTER — Encounter (HOSPITAL_COMMUNITY)
Admission: EM | Disposition: A | Discharge status: Home / Self Care | Payer: Self-pay | Source: Home / Self Care | Attending: Internal Medicine

## 2016-05-17 ENCOUNTER — Encounter (HOSPITAL_COMMUNITY): Payer: Self-pay

## 2016-05-17 DIAGNOSIS — I739 Peripheral vascular disease, unspecified: Secondary | ICD-10-CM

## 2016-05-17 DIAGNOSIS — I25118 Atherosclerotic heart disease of native coronary artery with other forms of angina pectoris: Secondary | ICD-10-CM

## 2016-05-17 HISTORY — PX: ESOPHAGOGASTRODUODENOSCOPY: SHX5428

## 2016-05-17 LAB — CBC
HCT: 25.5 % — ABNORMAL LOW (ref 39.0–52.0)
Hemoglobin: 8.4 g/dL — ABNORMAL LOW (ref 13.0–17.0)
MCH: 28.5 pg (ref 26.0–34.0)
MCHC: 32.9 g/dL (ref 30.0–36.0)
MCV: 86.4 fL (ref 78.0–100.0)
PLATELETS: 218 10*3/uL (ref 150–400)
RBC: 2.95 MIL/uL — ABNORMAL LOW (ref 4.22–5.81)
RDW: 16.1 % — AB (ref 11.5–15.5)
WBC: 8.2 10*3/uL (ref 4.0–10.5)

## 2016-05-17 LAB — BASIC METABOLIC PANEL
Anion gap: 5 (ref 5–15)
BUN: 23 mg/dL — AB (ref 6–20)
CALCIUM: 8.5 mg/dL — AB (ref 8.9–10.3)
CO2: 23 mmol/L (ref 22–32)
CREATININE: 1.52 mg/dL — AB (ref 0.61–1.24)
Chloride: 111 mmol/L (ref 101–111)
GFR calc Af Amer: 47 mL/min — ABNORMAL LOW (ref >60)
GFR, EST NON AFRICAN AMERICAN: 41 mL/min — AB (ref >60)
Glucose, Bld: 103 mg/dL — ABNORMAL HIGH (ref 65–99)
Potassium: 3.9 mmol/L (ref 3.5–5.1)
SODIUM: 139 mmol/L (ref 135–145)

## 2016-05-17 SURGERY — EGD (ESOPHAGOGASTRODUODENOSCOPY)
Anesthesia: Moderate Sedation

## 2016-05-17 MED ORDER — HYDROCODONE-ACETAMINOPHEN 5-325 MG PO TABS
1.0000 | ORAL_TABLET | Freq: Four times a day (QID) | ORAL | Status: DC | PRN
  Administered 2016-05-17: 1 via ORAL
  Filled 2016-05-17: qty 1

## 2016-05-17 MED ORDER — MIDAZOLAM HCL 5 MG/ML IJ SOLN
INTRAMUSCULAR | Status: AC
  Filled 2016-05-17: qty 2

## 2016-05-17 MED ORDER — PANTOPRAZOLE SODIUM 40 MG IV SOLR
40.0000 mg | Freq: Two times a day (BID) | INTRAVENOUS | Status: DC
  Administered 2016-05-17 – 2016-05-20 (×8): 40 mg via INTRAVENOUS
  Filled 2016-05-17 (×8): qty 40

## 2016-05-17 MED ORDER — LISINOPRIL 5 MG PO TABS
5.0000 mg | ORAL_TABLET | Freq: Every day | ORAL | Status: DC
  Administered 2016-05-19 – 2016-05-22 (×4): 5 mg via ORAL
  Filled 2016-05-17 (×5): qty 1

## 2016-05-17 MED ORDER — MIDAZOLAM HCL 10 MG/2ML IJ SOLN
INTRAMUSCULAR | Status: DC | PRN
  Administered 2016-05-17 (×2): 1 mg via INTRAVENOUS

## 2016-05-17 MED ORDER — TRAZODONE HCL 50 MG PO TABS
25.0000 mg | ORAL_TABLET | Freq: Every day | ORAL | Status: DC
  Administered 2016-05-17 – 2016-05-21 (×5): 25 mg via ORAL
  Filled 2016-05-17 (×5): qty 1

## 2016-05-17 MED ORDER — LORAZEPAM 2 MG/ML IJ SOLN
0.5000 mg | INTRAMUSCULAR | Status: DC | PRN
  Administered 2016-05-17: 0.5 mg via INTRAVENOUS
  Filled 2016-05-17: qty 1

## 2016-05-17 MED ORDER — SODIUM CHLORIDE 0.9 % IV SOLN
INTRAVENOUS | Status: DC
  Administered 2016-05-17 (×2): via INTRAVENOUS

## 2016-05-17 MED ORDER — ROPINIROLE HCL 0.5 MG PO TABS
0.2500 mg | ORAL_TABLET | Freq: Two times a day (BID) | ORAL | Status: DC
  Administered 2016-05-17 – 2016-05-22 (×9): 0.25 mg via ORAL
  Filled 2016-05-17 (×10): qty 1

## 2016-05-17 MED ORDER — SODIUM CHLORIDE 0.9 % IV SOLN
250.0000 mg | Freq: Once | INTRAVENOUS | Status: AC
  Administered 2016-05-17: 250 mg via INTRAVENOUS
  Filled 2016-05-17 (×2): qty 20

## 2016-05-17 MED ORDER — FENTANYL CITRATE (PF) 100 MCG/2ML IJ SOLN
INTRAMUSCULAR | Status: DC | PRN
  Administered 2016-05-17: 12.5 ug via INTRAVENOUS

## 2016-05-17 MED ORDER — PEG 3350-KCL-NA BICARB-NACL 420 G PO SOLR
4000.0000 mL | Freq: Once | ORAL | Status: AC
  Administered 2016-05-17: 4000 mL via ORAL
  Filled 2016-05-17: qty 4000

## 2016-05-17 MED ORDER — FENTANYL CITRATE (PF) 100 MCG/2ML IJ SOLN
INTRAMUSCULAR | Status: AC
  Filled 2016-05-17: qty 2

## 2016-05-17 MED ORDER — CARVEDILOL 3.125 MG PO TABS
3.1250 mg | ORAL_TABLET | Freq: Two times a day (BID) | ORAL | Status: DC
  Administered 2016-05-17 – 2016-05-22 (×10): 3.125 mg via ORAL
  Filled 2016-05-17 (×10): qty 1

## 2016-05-17 NOTE — Op Note (Signed)
[facility] Patient Name: Gregory [name] Procedure Date : 05/17/2016 MRN: [id number] Attending MD: Juanita Craver , MD Date of Birth: 07-23-1932 CSN: [id number] Age: 83 Admit Type: Inpatient Procedure:                Diagnostic EGD. Indications:              Iron deficiency anemia, Blood in stool;                            Gastro-esophageal reflux disease Providers:                Juanita Craver, MD, Laverta Baltimore RN, RN, Cletis [place], Technician Referring MD:              Medicines:                Fentanyl 12.5 micrograms & Midazolam 2 mg IV Complications:            No immediate complications. Estimated Blood Loss:     Estimated blood loss: none. Procedure:                Pre-anesthesia assessment: - Prior to the                            procedure, a history and physical was performed,                            and patient medications and allergies were                            reviewed. The patient's tolerance of previous                            anesthesia was also reviewed. The risks and                            benefits of the procedure and the sedation options                            and risks were discussed with the patient. All                            questions were answered, and informed consent was                            obtained. Prior Anticoagulants: The patient has                            taken Xarelto (Rivaroxaban), last dose was 3 days                            prior to procedure. ASA Grade Assessment: III - A  patient with severe systemic disease. After                            reviewing the risks and benefits, the patient was                            deemed in satisfactory condition to undergo the                            procedure. After obtaining informed consent, the                            endoscope was passed under direct vision.                            Throughout  the procedure, the patient's blood                            pressure, pulse, and oxygen saturations were                            monitored continuously. The [id number] ([id number])                            scope was introduced through the mouth, and                            advanced to the second part of duodenum. The EGD                            was accomplished without difficulty. The patient                            tolerated the procedure well. Scope In: Scope Out: Findings:      The examined esophagus and GEJ appeared widely patent and normal.      The entire examined stomach was normal.      The cardia and gastric fundus were normal on retroflexion.      The examined duodenum was normal. Impression:               - Normal appearing, widely patent esophagus and GEJ.                           - Normal stomach.                           - Normal examined duodenum.                           - No fresh or old heme in the upper GI tract.                           - No specimens collected. Moderate Sedation:      Moderate (conscious) sedation was administered by the endoscopy nurse  and supervised by the endoscopist. The following parameters were       monitored: oxygen saturation, heart rate, blood pressure, respiratory       rate, EKG, adequacy of pulmonary ventilation, and response to care.       Total physician intraservice time was 3 minutes. Recommendation:           - Clear liquid diet daily.                           - Continue present medications.                           - Perform a colonoscopy tomorrow. Procedure Code(s):        --- Professional ---                           [phone], Esophagogastroduodenoscopy, flexible,                            transoral; diagnostic, including collection of                            specimen(s) by brushing or washing, when performed                            (separate procedure) Diagnosis Code(s):        --- Professional  ---                           D50.9, Iron deficiency anemia, unspecified                           K92.1, Melena (includes Hematochezia)                           K21.9, Gastro-esophageal reflux disease without                            esophagitis CPT copyright 2016 [employer]. All rights reserved. The codes documented in this report are preliminary and upon coder review may  be revised to meet current compliance requirements. Juanita Craver, MD Juanita Craver, MD 05/17/2016 1:32:20 PM This report has been signed electronically. Number of Addenda: 0

## 2016-05-17 NOTE — Progress Notes (Addendum)
PROGRESS NOTE  ARROW TOMKO  TGG:[id number] DOB: Sep 02, 1932 DOA: 05/15/2016 PCP: Lamar Blinks, MD Outpatient Specialists:  Subjective: Seen with his daughter at bedside, reported that he did not sleep well because of restless legs. I appreciate GI help, EGD to be done later today. GI please advise when he can restart his LD ASA for his severe carotid disease, and Xarelto for A. fib  Brief Narrative:  Gregory [name] is a 83 y.o. male with medical history significant of CAD, PVD, chronic afib on xaralto comes in with a week of progressive worsening sob , doe, dizziness upon standing, weakness upon standing.  Denies any LOC.  Denies any abdominal pain.  Denies any n/v, no melena or brbpr.  Pt has h/o EGD about 2 years ago for dilation of an esophageal stricture.  He denies any heartburn chronically but is on protonix daily.  He went to see his PCP yesterday who checked labs, his hgb was low so he was called to come to ED.  His hgb 5 months ago was over 11, today it is less than 7.  Referred for admission for GIB.  Assessment & Plan:   Principal Problem:   GIB (gastrointestinal bleeding) Active Problems:   CAD (coronary artery disease),CABG 1993-LIMA to the LAD, SVG to Om and SVG to PDA/PLA, patent on cath 2014.   Chronic a-fib (HCC)   PVD - 70% LICA, 35% RICA by doppler 5/14   Anticoagulated on Xarelto   Coronary artery disease involving native coronary artery   Chronic renal failure, stage 3 (moderate)   GI bleed -Baseline hemoglobin around 11 from 5 months ago. -Has anemia of hemoglobin of 6.3 with low iron and positive FOBT, likely from GI bleed. -Gastroenterology consulted. -Started on Protonix drip, will switch to twice a day, EGD later today by GI. -? colonoscopy if EGD does not show any LOC.  Anemia -Anemia of acute blood loss secondary to GI bleed, hemoglobin nadir was 6.3. -Status post transfusion of 2 units of packed RBCs hemoglobin stable at 8.4. -Check hemoglobin  daily, CBC in a.m.  CAD -CABG 1993-LIMA to the LAD, SVG to Om and SVG to PDA/PLA, patent on cath 2014. -Holding all blood thinners, on aspirin.  Chronic a-fib -Rate is controlled with beta blockers. History of splenic infarct. -CHA2DS2-VASc is >3, is on Xarelto, hold because of concurrent bleed.  PVD -00-93% LICA stenosis, 81% RICA by doppler and 12/02/2015, follows with cardiology as outpatient. -Follows with Dr. Claiborne Billings, per last note he does not believe he wants surgery.  Chronic anticoagulation -On chronic anticoagulation for his chronic atrial fibrillation and PVD. -On Xarelto, this is on hold.  Coronary artery disease involving native coronary artery- as above  Chronic renal failure, stage 3 -Currently around his baseline creatinine is 1.52.   Restless leg syndrome -Patient is on Requip, restarted, likely this is being exacerbated by iron deficiency. -Nulecit iron infusion to be given   DVT prophylaxis: SCDs Code Status: Full Code Family Communication:  Disposition Plan:  Diet: Diet NPO time specified  Consultants:   GI  Procedures:   None  Antimicrobials:   None   Objective: Vitals:   05/16/16 1406 05/16/16 1723 05/16/16 2105 05/17/16 0530  BP: (!) 152/56 (!) 156/57 (!) 160/60 (!) 160/52  Pulse: 78 77 78 66  Resp: 18 17 19 19   Temp: 98.6 F (37 C) 98.9 F (37.2 C) 99 F (37.2 C) 98.3 F (36.8 C)  TempSrc: Oral Oral Oral Oral  SpO2: 94% 96% 92%  94%  Weight:      Height:        Intake/Output Summary (Last 24 hours) at 05/17/16 0908 Last data filed at 05/17/16 0700  Gross per 24 hour  Intake              470 ml  Output             2050 ml  Net            -1580 ml   Filed Weights   05/16/16 0351  Weight: 88.5 kg (195 lb 1.7 oz)    Examination: General exam: Appears calm and comfortable  Respiratory system: Clear to auscultation. Respiratory effort normal. Cardiovascular system: S1 & S2 heard, RRR. No JVD, murmurs, rubs, gallops or  clicks. No pedal edema. Gastrointestinal system: Abdomen is nondistended, soft and nontender. No organomegaly or masses felt. Normal bowel sounds heard. Central nervous system: Alert and oriented. No focal neurological deficits. Extremities: Symmetric 5 x 5 power. Skin: No rashes, lesions or ulcers Psychiatry: Judgement and insight appear normal. Mood & affect appropriate.   Data Reviewed: I have personally reviewed following labs and imaging studies  CBC:  Recent Labs Lab 05/15/16 1631 05/15/16 2324 05/15/16 2335 05/16/16 1004 05/17/16 0402  WBC 6.9 5.6  --  7.1 8.2  NEUTROABS 3,381 2.5  --   --   --   HGB 6.7* 6.3* 6.5* 8.5* 8.4*  HCT 20.9* 19.9* 19.0* 26.2* 25.5*  MCV 86.0 88.4  --  86.2 86.4  PLT 242 237  --  206 161   Basic Metabolic Panel:  Recent Labs Lab 05/15/16 1631 05/15/16 2324 05/15/16 2335 05/16/16 1004 05/17/16 0402  NA 140 141 140 141 139  K 4.8 4.0 3.9 4.1 3.9  CL 107 107 109 113* 111  CO2 25 25  --  23 23  GLUCOSE 117* 96 92 99 103*  BUN 33* 31* 32* 28* 23*  CREATININE 1.56* 1.91* 1.90* 1.60* 1.52*  CALCIUM 8.6 8.8*  --  8.6* 8.5*   GFR: Estimated Creatinine Clearance: 39.1 mL/min (by C-G formula based on SCr of 1.52 mg/dL (H)). Liver Function Tests:  Recent Labs Lab 05/15/16 1631 05/15/16 2324  AST 14 17  ALT 8* 10*  ALKPHOS 63 59  BILITOT 0.5 0.4  PROT 6.2 6.1*  ALBUMIN 3.3* 2.9*   No results for input(s): LIPASE, AMYLASE in the last 168 hours. No results for input(s): AMMONIA in the last 168 hours. Coagulation Profile: No results for input(s): INR, PROTIME in the last 168 hours. Cardiac Enzymes: No results for input(s): CKTOTAL, CKMB, CKMBINDEX, TROPONINI in the last 168 hours. BNP (last 3 results) No results for input(s): PROBNP in the last 8760 hours. HbA1C: No results for input(s): HGBA1C in the last 72 hours. CBG:  Recent Labs Lab 05/16/16 1721  GLUCAP 90   Lipid Profile: No results for input(s): CHOL, HDL, LDLCALC,  TRIG, CHOLHDL, LDLDIRECT in the last 72 hours. Thyroid Function Tests:  Recent Labs  05/15/16 1631  TSH 3.18   Anemia Panel:  Recent Labs  05/16/16 0043  VITAMINB12 266  FOLATE 9.3  FERRITIN 38  TIBC 307  IRON 33*  RETICCTPCT 2.9   Urine analysis:    Component Value Date/Time   COLORURINE YELLOW 05/15/2016 1631   APPEARANCEUR CLEAR 05/15/2016 1631   LABSPEC 1.013 05/15/2016 1631   PHURINE 5.5 05/15/2016 1631   GLUCOSEU NEGATIVE 05/15/2016 1631   HGBUR 1+ (A) 05/15/2016 1631   [place] NEGATIVE 05/15/2016 1631  BILIRUBINUR negative 01/20/2013 1623   KETONESUR NEGATIVE 05/15/2016 1631   PROTEINUR 1+ (A) 05/15/2016 1631   UROBILINOGEN 0.2 01/20/2013 1623   UROBILINOGEN 1.0 03/30/2011 2224   NITRITE NEGATIVE 05/15/2016 1631   LEUKOCYTESUR NEGATIVE 05/15/2016 1631   Sepsis Labs: @LABRCNTIP (procalcitonin:4,lacticidven:4)  )No results found for this or any previous visit (from the past 240 hour(s)).   Invalid input(s): PROCALCITONIN, LACTICACIDVEN   Radiology Studies: Dg Chest 2 View  Result Date: 05/15/2016 CLINICAL DATA:  Shortness of breath for 3 days EXAM: CHEST  2 VIEW COMPARISON:  08/09/15 FINDINGS: Cardiac shadow remains enlarged. Postsurgical changes are noted. The lungs are clear bilaterally. No acute bony abnormality is seen. IMPRESSION: No acute abnormality noted. Electronically Signed   By: Inez Catalina M.D.   On: 05/15/2016 17:11   Ct Head Wo Contrast  Result Date: 05/15/2016 CLINICAL DATA:  Dizziness starting yesterday, possible CVA EXAM: CT HEAD WITHOUT CONTRAST TECHNIQUE: Contiguous axial images were obtained from the base of the skull through the vertex without intravenous contrast. COMPARISON:  07/30/2015 FINDINGS: Brain: No intracranial hemorrhage, mass effect or midline shift. Stable cerebral atrophy. Stable periventricular and patchy subcortical chronic white matter disease. Old right basal ganglia infarct is stable. No definite acute cortical  infarction. No mass lesion is noted on this unenhanced scan. Ventricular size is stable from prior exam. Vascular: Atherosclerotic calcifications of carotid siphon again noted. Skull: No skull fracture is noted. Sinuses/Orbits: No paranasal sinuses air-fluid levels. The mastoid air cells are unremarkable. Other: None IMPRESSION: No acute intracranial abnormality. Stable old infarct in right basal ganglia. Stable atrophy and chronic white matter disease. No definite acute cortical infarction. No mass lesion is noted on this unenhanced scan. Electronically Signed   By: Lahoma Crocker M.D.   On: 05/15/2016 17:13        Scheduled Meds: . sodium chloride flush  3 mL Intravenous Q12H   Continuous Infusions: . sodium chloride 20 mL/hr at 05/17/16 0245  . pantoprozole (PROTONIX) infusion 8 mg/hr (05/16/16 2219)     LOS: 1 day    Time spent: 35 minutes    Emerson Barretto A, MD [facility] Pager [phone]  If 7PM-7AM, please contact night-coverage [email or web address] Password [email or web address] 05/17/2016, 9:08 AM

## 2016-05-17 NOTE — Progress Notes (Signed)
1400 Received pt from Endo, lethargic. Will start clear liquid diet when fully awake.

## 2016-05-18 ENCOUNTER — Encounter: Payer: Self-pay | Admitting: Gastroenterology

## 2016-05-18 ENCOUNTER — Encounter (HOSPITAL_COMMUNITY): Payer: Self-pay | Admitting: Gastroenterology

## 2016-05-18 ENCOUNTER — Encounter (HOSPITAL_COMMUNITY)
Admission: EM | Disposition: A | Discharge status: Home / Self Care | Payer: Self-pay | Source: Home / Self Care | Attending: Internal Medicine

## 2016-05-18 ENCOUNTER — Inpatient Hospital Stay (HOSPITAL_COMMUNITY): Payer: Medicare Other | Admitting: Anesthesiology

## 2016-05-18 ENCOUNTER — Telehealth: Payer: Self-pay | Admitting: Family

## 2016-05-18 DIAGNOSIS — D649 Anemia, unspecified: Secondary | ICD-10-CM

## 2016-05-18 DIAGNOSIS — D62 Acute posthemorrhagic anemia: Secondary | ICD-10-CM

## 2016-05-18 DIAGNOSIS — D127 Benign neoplasm of rectosigmoid junction: Secondary | ICD-10-CM

## 2016-05-18 DIAGNOSIS — K222 Esophageal obstruction: Secondary | ICD-10-CM

## 2016-05-18 HISTORY — PX: GIVENS CAPSULE STUDY: SHX5432

## 2016-05-18 HISTORY — PX: COLONOSCOPY: SHX5424

## 2016-05-18 LAB — CBC
HCT: 21 % — ABNORMAL LOW (ref 39.0–52.0)
HCT: 25 % — ABNORMAL LOW (ref 39.0–52.0)
Hemoglobin: 6.8 g/dL — CL (ref 13.0–17.0)
Hemoglobin: 8 g/dL — ABNORMAL LOW (ref 13.0–17.0)
MCH: 28 pg (ref 26.0–34.0)
MCH: 27.7 pg (ref 26.0–34.0)
MCHC: 32.4 g/dL (ref 30.0–36.0)
MCHC: 32 g/dL (ref 30.0–36.0)
MCV: 86.4 fL (ref 78.0–100.0)
MCV: 86.5 fL (ref 78.0–100.0)
PLATELETS: 197 10*3/uL (ref 150–400)
Platelets: 190 10*3/uL (ref 150–400)
RBC: 2.43 MIL/uL — AB (ref 4.22–5.81)
RBC: 2.89 MIL/uL — ABNORMAL LOW (ref 4.22–5.81)
RDW: 15.7 % — ABNORMAL HIGH (ref 11.5–15.5)
RDW: 16.5 % — AB (ref 11.5–15.5)
WBC: 7.2 10*3/uL (ref 4.0–10.5)
WBC: 5.8 10*3/uL (ref 4.0–10.5)

## 2016-05-18 LAB — BASIC METABOLIC PANEL
Anion gap: 4 — ABNORMAL LOW (ref 5–15)
BUN: 22 mg/dL — AB (ref 6–20)
CHLORIDE: 110 mmol/L (ref 101–111)
CO2: 25 mmol/L (ref 22–32)
Calcium: 8 mg/dL — ABNORMAL LOW (ref 8.9–10.3)
Creatinine, Ser: 1.51 mg/dL — ABNORMAL HIGH (ref 0.61–1.24)
GFR calc Af Amer: 48 mL/min — ABNORMAL LOW (ref >60)
GFR calc non Af Amer: 41 mL/min — ABNORMAL LOW (ref >60)
Glucose, Bld: 107 mg/dL — ABNORMAL HIGH (ref 65–99)
POTASSIUM: 3.6 mmol/L (ref 3.5–5.1)
SODIUM: 139 mmol/L (ref 135–145)

## 2016-05-18 LAB — PREPARE RBC (CROSSMATCH)

## 2016-05-18 SURGERY — COLONOSCOPY
Anesthesia: Monitor Anesthesia Care

## 2016-05-18 SURGERY — IMAGING PROCEDURE, GI TRACT, INTRALUMINAL, VIA CAPSULE
Anesthesia: LOCAL

## 2016-05-18 MED ORDER — FUROSEMIDE 10 MG/ML IJ SOLN
40.0000 mg | Freq: Once | INTRAMUSCULAR | Status: AC
  Administered 2016-05-18: 40 mg via INTRAVENOUS
  Filled 2016-05-18: qty 4

## 2016-05-18 MED ORDER — LIDOCAINE 2% (20 MG/ML) 5 ML SYRINGE
INTRAMUSCULAR | Status: DC | PRN
  Administered 2016-05-18: 40 mg via INTRAVENOUS

## 2016-05-18 MED ORDER — PHENYLEPHRINE 40 MCG/ML (10ML) SYRINGE FOR IV PUSH (FOR BLOOD PRESSURE SUPPORT)
PREFILLED_SYRINGE | INTRAVENOUS | Status: DC | PRN
  Administered 2016-05-18 (×5): 80 ug via INTRAVENOUS

## 2016-05-18 MED ORDER — PROPOFOL 500 MG/50ML IV EMUL
INTRAVENOUS | Status: DC | PRN
  Administered 2016-05-18: 200 ug/kg/min via INTRAVENOUS

## 2016-05-18 MED ORDER — SODIUM CHLORIDE 0.9 % IV SOLN
INTRAVENOUS | Status: DC | PRN
  Administered 2016-05-18: 14:00:00 via INTRAVENOUS

## 2016-05-18 MED ORDER — EPHEDRINE SULFATE-NACL 50-0.9 MG/10ML-% IV SOSY
PREFILLED_SYRINGE | INTRAVENOUS | Status: DC | PRN
  Administered 2016-05-18 (×3): 5 mg via INTRAVENOUS

## 2016-05-18 MED ORDER — SODIUM CHLORIDE 0.9 % IV SOLN: Freq: Once | INTRAVENOUS | Status: DC

## 2016-05-18 NOTE — Progress Notes (Signed)
PROGRESS NOTE  Gregory [name]  TDS:[id number] DOB: 1932/09/17 DOA: 05/15/2016 PCP: Lamar Blinks, MD Outpatient Specialists:  Subjective: Seen with his wife at bedside, reported multiple bloody bowel movements throughout the night. Colonoscopy to be done later today. Hemoglobin dropped to 6.8, hemoglobin is 8.0 after third unit transfused, will transfuse a fourth unit later today.  Brief Narrative:  Gregory [name] is a 83 y.o. male with medical history significant of CAD, PVD, chronic afib on xaralto comes in with a week of progressive worsening sob , doe, dizziness upon standing, weakness upon standing.  Denies any LOC.  Denies any abdominal pain.  Denies any n/v, no melena or brbpr.  Pt has h/o EGD about 2 years ago for dilation of an esophageal stricture.  He denies any heartburn chronically but is on protonix daily.  He went to see his PCP yesterday who checked labs, his hgb was low so he was called to come to ED.  His hgb 5 months ago was over 11, today it is less than 7.  Referred for admission for GIB.  Assessment & Plan:   Principal Problem:   GIB (gastrointestinal bleeding) Active Problems:   CAD (coronary artery disease),CABG 1993-LIMA to the LAD, SVG to Om and SVG to PDA/PLA, patent on cath 2014.   Chronic a-fib (HCC)   PVD - 26% LICA, 20% RICA by doppler 5/14   Anticoagulated on Xarelto   Coronary artery disease involving native coronary artery   Chronic renal failure, stage 3 (moderate)   GI bleed -Baseline hemoglobin around 11 from 5 months ago. -Has anemia of hemoglobin of 6.3 with low iron and positive FOBT, likely from GI bleed. -Gastroenterology consulted. -Started on Protonix drip, will switch to twice a day, EGD later today by GI. -Continue to hold aspirin and Xarelto. -EGD without acute findings, colonoscopy scheduled for later today, continues to have bright red blood per rectum.  Anemia -Anemia of acute blood loss secondary to GI bleed, hemoglobin nadir  was 6.3. -Status post transfusion of 2 units of packed RBCs hemoglobin stable at 8.4. -Hemoglobin dropped to 6.8, status post transfusion of 1 unit packed RBCs, transfuse another unit, check CBC in a.m.  CAD -CABG 1993-LIMA to the LAD, SVG to Om and SVG to PDA/PLA, patent on cath 2014. -Holding all blood thinners, on aspirin.  Chronic a-fib -Rate is controlled with beta blockers. History of splenic infarct. -CHA2DS2-VASc is >3, is on Xarelto, hold because of concurrent bleed.  PVD -35-59% LICA stenosis, 74% RICA by doppler and 12/02/2015, follows with cardiology as outpatient. -Follows with Dr. Claiborne Billings, per last note he does not believe he wants surgery.  Chronic anticoagulation -On chronic anticoagulation for his chronic atrial fibrillation and PVD. -On Xarelto, this is on hold.  Coronary artery disease involving native coronary artery- as above  Chronic renal failure, stage 3 -Currently around his baseline creatinine is 1.52.   Restless leg syndrome -Patient is on Requip, restarted, likely this is being exacerbated by iron deficiency. -Nulecit iron infusion to be given   DVT prophylaxis: SCDs Code Status: Full Code Family Communication:  Disposition Plan:  Diet: Diet NPO time specified  Consultants:   GI  Procedures:   None  Antimicrobials:   None   Objective: Vitals:   05/18/16 0506 05/18/16 0538 05/18/16 0601 05/18/16 0856  BP: (!) 122/53 136/64 (!) 122/59 (!) 155/61  Pulse: 71 76 64 79  Resp: 17 20 18 15   Temp: 97.5 F (36.4 C) 97.3 F (36.3 C) 97.9 F (  36.6 C) 98.1 F (36.7 C)  TempSrc: Oral Oral Oral Oral  SpO2: 94% 100%  96%  Weight: 88.7 kg (195 lb 8.8 oz)     Height:        Intake/Output Summary (Last 24 hours) at 05/18/16 1315 Last data filed at 05/18/16 0856  Gross per 24 hour  Intake              650 ml  Output                1 ml  Net              649 ml   Filed Weights   05/16/16 0351 05/18/16 0506  Weight: 88.5 kg (195 lb 1.7  oz) 88.7 kg (195 lb 8.8 oz)    Examination: General exam: Appears calm and comfortable  Respiratory system: Clear to auscultation. Respiratory effort normal. Cardiovascular system: S1 & S2 heard, RRR. No JVD, murmurs, rubs, gallops or clicks. No pedal edema. Gastrointestinal system: Abdomen is nondistended, soft and nontender. No organomegaly or masses felt. Normal bowel sounds heard. Central nervous system: Alert and oriented. No focal neurological deficits. Extremities: Symmetric 5 x 5 power. Skin: No rashes, lesions or ulcers Psychiatry: Judgement and insight appear normal. Mood & affect appropriate.   Data Reviewed: I have personally reviewed following labs and imaging studies  CBC:  Recent Labs Lab 05/15/16 1631 05/15/16 2324 05/15/16 2335 05/16/16 1004 05/17/16 0402 05/18/16 0326 05/18/16 1031  WBC 6.9 5.6  --  7.1 8.2 7.2 5.8  NEUTROABS 3,381 2.5  --   --   --   --   --   HGB 6.7* 6.3* 6.5* 8.5* 8.4* 6.8* 8.0*  HCT 20.9* 19.9* 19.0* 26.2* 25.5* 21.0* 25.0*  MCV 86.0 88.4  --  86.2 86.4 86.4 86.5  PLT 242 237  --  206 218 190 350   Basic Metabolic Panel:  Recent Labs Lab 05/15/16 1631 05/15/16 2324 05/15/16 2335 05/16/16 1004 05/17/16 0402 05/18/16 0326  NA 140 141 140 141 139 139  K 4.8 4.0 3.9 4.1 3.9 3.6  CL 107 107 109 113* 111 110  CO2 25 25  --  23 23 25   GLUCOSE 117* 96 92 99 103* 107*  BUN 33* 31* 32* 28* 23* 22*  CREATININE 1.56* 1.91* 1.90* 1.60* 1.52* 1.51*  CALCIUM 8.6 8.8*  --  8.6* 8.5* 8.0*   GFR: Estimated Creatinine Clearance: 39.4 mL/min (by C-G formula based on SCr of 1.51 mg/dL (H)). Liver Function Tests:  Recent Labs Lab 05/15/16 1631 05/15/16 2324  AST 14 17  ALT 8* 10*  ALKPHOS 63 59  BILITOT 0.5 0.4  PROT 6.2 6.1*  ALBUMIN 3.3* 2.9*   No results for input(s): LIPASE, AMYLASE in the last 168 hours. No results for input(s): AMMONIA in the last 168 hours. Coagulation Profile: No results for input(s): INR, PROTIME in the  last 168 hours. Cardiac Enzymes: No results for input(s): CKTOTAL, CKMB, CKMBINDEX, TROPONINI in the last 168 hours. BNP (last 3 results) No results for input(s): PROBNP in the last 8760 hours. HbA1C: No results for input(s): HGBA1C in the last 72 hours. CBG:  Recent Labs Lab 05/16/16 1721  GLUCAP 90   Lipid Profile: No results for input(s): CHOL, HDL, LDLCALC, TRIG, CHOLHDL, LDLDIRECT in the last 72 hours. Thyroid Function Tests:  Recent Labs  05/15/16 1631  TSH 3.18   Anemia Panel:  Recent Labs  05/16/16 0043  VITAMINB12 266  FOLATE 9.3  FERRITIN 38  TIBC 307  IRON 33*  RETICCTPCT 2.9   Urine analysis:    Component Value Date/Time   COLORURINE YELLOW 05/15/2016 1631   APPEARANCEUR CLEAR 05/15/2016 1631   LABSPEC 1.013 05/15/2016 1631   PHURINE 5.5 05/15/2016 1631   GLUCOSEU NEGATIVE 05/15/2016 1631   HGBUR 1+ (A) 05/15/2016 1631   BILIRUBINUR NEGATIVE 05/15/2016 1631   BILIRUBINUR negative 01/20/2013 1623   KETONESUR NEGATIVE 05/15/2016 1631   PROTEINUR 1+ (A) 05/15/2016 1631   UROBILINOGEN 0.2 01/20/2013 1623   UROBILINOGEN 1.0 03/30/2011 2224   NITRITE NEGATIVE 05/15/2016 1631   LEUKOCYTESUR NEGATIVE 05/15/2016 1631   Sepsis Labs: @LABRCNTIP (procalcitonin:4,lacticidven:4)  )No results found for this or any previous visit (from the past 240 hour(s)).   Invalid input(s): PROCALCITONIN, [place]   Radiology Studies: No results found.      Scheduled Meds: . sodium chloride   Intravenous Once  . carvedilol  3.125 mg Oral BID WC  . lisinopril  5 mg Oral Daily  . pantoprazole (PROTONIX) IV  40 mg Intravenous Q12H  . rOPINIRole  0.25 mg Oral BID  . traZODone  25 mg Oral QHS   Continuous Infusions:    LOS: 2 days    Time spent: 35 minutes    Waynesha Rammel A, MD [facility] Pager [phone]  If 7PM-7AM, please contact night-coverage [email or web address] Password [email or web address] 05/18/2016, 1:15 PM

## 2016-05-18 NOTE — Interval H&P Note (Signed)
History and Physical Interval Note:  05/18/2016 1:35 PM  Gregory [name]  has presented today for surgery, with the diagnosis of Blood in stool, anemia, CEC screening  The various methods of treatment have been discussed with the patient and family. After consideration of risks, benefits and other options for treatment, the patient has consented to  Procedure(s): COLONOSCOPY (N/A) as a surgical intervention .  The patient's history has been reviewed, patient examined, no change in status, stable for surgery.  I have reviewed the patient's chart and labs.  Questions were answered to the patient's satisfaction.     Renelda Loma [name]

## 2016-05-18 NOTE — Progress Notes (Addendum)
Pt to Endo for Colonoscopy, NPO maint. Had one loose bloody BM this morning. 1600 Received pt back from Endo, A&O x4. Pt on Capsule endoscopy, recorder in place.  1821 One unit of PRBCs started.

## 2016-05-18 NOTE — H&P (View-Only) (Signed)
      Progress Note   Subjective  Patient had a negative EGD yesterday. Overnight he tolerated Golytely bowel prep but has had bright red blood per rectum per family. Hgb dropped down to 6s, receiving PRBC transfusion this morning.    Objective   Vital signs in last 24 hours: Temp:  [97.3 F (36.3 C)-98.2 F (36.8 C)] 97.9 F (36.6 C) (03/05 0601) Pulse Rate:  [63-79] 64 (03/05 0601) Resp:  [17-23] 18 (03/05 0601) BP: (122-160)/(46-72) 122/59 (03/05 0601) SpO2:  [94 %-100 %] 100 % (03/05 0538) Weight:  [195 lb 8.8 oz (88.7 kg)] 195 lb 8.8 oz (88.7 kg) (03/05 0506) Last BM Date: 05/17/16 General:    white male in NAD, sleeping Heart:  Regular rate and rhythm; Lungs: Respirations even and unlabored,  Abdomen:  Soft, nontender and nondistended. . Extremities:  Without edema. Neurologic:  grossly normal neurologically. t.  Intake/Output from previous day: 03/04 0701 - 03/05 0700 In: 300 [P.O.:270; Blood:30] Out: 2 [Urine:2] Intake/Output this shift: No intake/output data recorded.  Lab Results:  Recent Labs  05/16/16 1004 05/17/16 0402 05/18/16 0326  WBC 7.1 8.2 7.2  HGB 8.5* 8.4* 6.8*  HCT 26.2* 25.5* 21.0*  PLT 206 218 190   BMET  Recent Labs  05/16/16 1004 05/17/16 0402 05/18/16 0326  NA 141 139 139  K 4.1 3.9 3.6  CL 113* 111 110  CO2 23 23 25   GLUCOSE 99 103* 107*  BUN 28* 23* 22*  CREATININE 1.60* 1.52* 1.51*  CALCIUM 8.6* 8.5* 8.0*   LFT  Recent Labs  05/15/16 2324  PROT 6.1*  ALBUMIN 2.9*  AST 17  ALT 10*  ALKPHOS 59  BILITOT 0.4   PT/INR No results for input(s): LABPROT, INR in the last 72 hours.  Studies/Results: No results found.     Assessment / Plan:   83 y/o male with multiple medical problems including CAD and PVD, chronic AF on Xarelto (last dosed 05/15/16), presenting with symptomatic anemia. EGD performed yesterday without any clear etiology. He's never had a prior colonoscopy and now with overt hematochezia and  recurrent anemia.   I discussed his case at length with family and patient including differential which now appears more likely colonic in etiology versus small bowel bleeding. Plan for colonoscopy this afternoon after he has completed PRBC transfusions. NPO for now. I have discussed risks / benefits and they wished to proceed. Of note, last dose of Xarelto 05/15/16, given GFR in 40s most of this should now be out of his system. Please call with questions / changes in status in the interim.   Gilson Cellar, MD [facility] Pager [phone]

## 2016-05-18 NOTE — Consult Note (Signed)
[facility] CM Primary Care Navigator  05/18/2016  BLASE BECKNER [birth year] [id number]  Met with patient, wife Evelena Peat) and daughter [name]) at the bedside to identify possible discharge needs. Patient's wife reports "being so weak and dizzy" that had led to patient's hospital admission.  Wife endorses Dr. Janett Billow [name] with [employer] at [facility] as the primary care provider.    Patient shared using [facility] to obtain medications without difficulty.   He reports managing his medications at home with wife's assistance.   Patient's wife or daughter provides transportation to his doctors' appointments.  Patient had been active and independent with self care prior to admission. Daughter states that patient have great family support. Wife and children will serve as his caregivers at home.   Anticipated discharge plan is home with family's support and assistance per daughter.  Patient voiced understanding to call primary care provider's office when he gets back home, for a post discharge follow-up appointment within a week or sooner if needed. Patient letter (with PCP's contact number) was provided as their reminder.  Patient/wife and daughter denied any further needs or concerns at this time. [facility] care management contact information provided for any future needs that may arise.  For additional questions please contact:  Edwena Felty A. Caton Popowski, BSN, RN-BC [facility] PRIMARY CARE Navigator Cell: [phone]

## 2016-05-18 NOTE — Progress Notes (Signed)
CRITICAL VALUE ALERT  Critical value received: hgb 6.4  Date of notification:  05/18/16  Time of notification:  3291  Critical value read back:Yes.    Nurse who received alert: Alba Cory  MD notified (1st page):  Raliegh Ip Schorr  Time of first page:  [phone]  MD notified (2nd page): Responding MD:K. Schorr Time of second page:    Time MD responded:  [phone]

## 2016-05-18 NOTE — Progress Notes (Addendum)
      Progress Note   Subjective  Patient had a negative EGD yesterday. Overnight he tolerated Golytely bowel prep but has had bright red blood per rectum per family. Hgb dropped down to 6s, receiving PRBC transfusion this morning.    Objective   Vital signs in last 24 hours: Temp:  [97.3 F (36.3 C)-98.2 F (36.8 C)] 97.9 F (36.6 C) (03/05 0601) Pulse Rate:  [63-79] 64 (03/05 0601) Resp:  [17-23] 18 (03/05 0601) BP: (122-160)/(46-72) 122/59 (03/05 0601) SpO2:  [94 %-100 %] 100 % (03/05 0538) Weight:  [195 lb 8.8 oz (88.7 kg)] 195 lb 8.8 oz (88.7 kg) (03/05 0506) Last BM Date: 05/17/16 General:    white male in NAD, sleeping Heart:  Regular rate and rhythm; Lungs: Respirations even and unlabored,  Abdomen:  Soft, nontender and nondistended. . Extremities:  Without edema. Neurologic:  grossly normal neurologically. t.  Intake/Output from previous day: 03/04 0701 - 03/05 0700 In: 300 [P.O.:270; Blood:30] Out: 2 [Urine:2] Intake/Output this shift: No intake/output data recorded.  Lab Results:  Recent Labs  05/16/16 1004 05/17/16 0402 05/18/16 0326  WBC 7.1 8.2 7.2  HGB 8.5* 8.4* 6.8*  HCT 26.2* 25.5* 21.0*  PLT 206 218 190   BMET  Recent Labs  05/16/16 1004 05/17/16 0402 05/18/16 0326  NA 141 139 139  K 4.1 3.9 3.6  CL 113* 111 110  CO2 23 23 25   GLUCOSE 99 103* 107*  BUN 28* 23* 22*  CREATININE 1.60* 1.52* 1.51*  CALCIUM 8.6* 8.5* 8.0*   LFT  Recent Labs  05/15/16 2324  PROT 6.1*  ALBUMIN 2.9*  AST 17  ALT 10*  ALKPHOS 59  BILITOT 0.4   PT/INR No results for input(s): LABPROT, INR in the last 72 hours.  Studies/Results: No results found.     Assessment / Plan:   83 y/o male with multiple medical problems including CAD and PVD, chronic AF on Xarelto (last dosed 05/15/16), presenting with symptomatic anemia. EGD performed yesterday without any clear etiology. He's never had a prior colonoscopy and now with overt hematochezia and  recurrent anemia.   I discussed his case at length with family and patient including differential which now appears more likely colonic in etiology versus small bowel bleeding. Plan for colonoscopy this afternoon after he has completed PRBC transfusions. NPO for now. I have discussed risks / benefits and they wished to proceed. Of note, last dose of Xarelto 05/15/16, given GFR in 40s most of this should now be out of his system. Please call with questions / changes in status in the interim.   [place]rett [place] Cellar, MD [facility]astroenter[facility] Pager [phone]

## 2016-05-18 NOTE — Op Note (Signed)
[facility] Patient Name: Gregory [name] Procedure Date : 05/18/2016 MRN: [id number] Attending MD: Carlota Raspberry. Nyomi Howser MD, MD Date of Birth: Dec 24, 1932 CSN: [id number] Age: 83 Admit Type: Inpatient Procedure:                Colonoscopy Indications:              anemia, suspected GI bleeding, EGD negative Providers:                Remo Lipps P. Eliezer Khawaja MD, MD, Cleda Daub, RN,                            Elspeth Cho Tech., Technician Referring MD:              Medicines:                Monitored Anesthesia Care Complications:            No immediate complications. Estimated blood loss:                            Minimal. Estimated Blood Loss:     Estimated blood loss was minimal. Procedure:                Pre-Anesthesia Assessment:                           - Prior to the procedure, a History and Physical                            was performed, and patient medications and                            allergies were reviewed. The patient's tolerance of                            previous anesthesia was also reviewed. The risks                            and benefits of the procedure and the sedation                            options and risks were discussed with the patient.                            All questions were answered, and informed consent                            was obtained. Prior Anticoagulants: The patient has                            taken Xarelto (rivaroxaban), last dose was 4 days                            prior to procedure. ASA Grade Assessment: III - A  patient with severe systemic disease. After                            reviewing the risks and benefits, the patient was                            deemed in satisfactory condition to undergo the                            procedure.                           After obtaining informed consent, the colonoscope                            was passed under direct vision.  Throughout the                            procedure, the patient's blood pressure, pulse, and                            oxygen saturations were monitored continuously. The                            EC-[id number] ([id number]) scope was introduced through                            the anus and advanced to the the terminal ileum,                            with identification of the appendiceal orifice and                            IC valve. The colonoscopy was performed without                            difficulty. The patient tolerated the procedure                            well. The quality of the bowel preparation was fair                            during intubation but several minutes spent                            lavaging with adequate views obtained. The terminal                            ileum, ileocecal valve, appendiceal orifice, and                            rectum were photographed. Scope In: 2:37:53 PM Scope Out: 3:03:50 PM Scope Withdrawal Time: 0 hours 19 minutes 29 seconds  Total Procedure Duration: 0 hours 25 minutes 57 seconds  Findings:      The perianal and digital rectal examinations were normal.      The terminal ileum appeared normal.      Multiple medium-mouthed diverticula were found in the left colon.      A 6 mm polyp was found in the recto-sigmoid colon. The polyp was       pedunculated. The polyp was removed with a hot snare. Resection and       retrieval were complete.      Internal hemorrhoids were found during retroflexion.      The exam was otherwise without abnormality. Impression:               - Preparation of the colon was fair leading to                            several minutes of lavage with clearing, adequate                            views obtained.                           - The examined portion of the ileum was normal.                           - Diverticulosis in the left colon.                           - One 6 mm polyp at the  recto-sigmoid colon,                            removed with a hot snare. Resected and retrieved.                           - Internal hemorrhoids, moderate in size.                           - The examination was otherwise normal.                           Brown stool in the colon, no obvious evidence of                            bleeding Moderate Sedation:      No moderate sedation, case performed with MAC Recommendation:           - Return patient to hospital ward for ongoing care.                           - NPO if capsule considered                           - Will discuss with family, given need for further                            anticoagulation without a clear source for his  anemia / bleeding, recommend capsule endoscopy if                            no contraindications                           - Continue present medications.                           - Await pathology results.                           - GI service will continue to follow Procedure Code(s):        --- Professional ---                           [phone], Colonoscopy, flexible; with removal of                            tumor(s), polyp(s), or other lesion(s) by snare                            technique Diagnosis Code(s):        --- Professional ---                           K64.8, Other hemorrhoids                           D12.7, Benign neoplasm of rectosigmoid junction                           D62, Acute posthemorrhagic anemia                           K57.30, Diverticulosis of large intestine without                            perforation or abscess without bleeding CPT copyright 2016 American Medical Association. All rights reserved. The codes documented in this report are preliminary and upon coder review may  be revised to meet current compliance requirements. Remo Lipps P. Haakon Titsworth MD, MD 05/18/2016 3:22:00 PM This report has been signed electronically. Number of Addenda: 0

## 2016-05-18 NOTE — Anesthesia Preprocedure Evaluation (Signed)
Anesthesia Evaluation  Patient identified by MRN, date of birth, ID band Patient awake    Reviewed: Allergy & Precautions, NPO status , Patient's Chart, lab work & pertinent test results  Airway Mallampati: II  TM Distance: >3 FB Neck ROM: Full    Dental no notable dental hx.    Pulmonary former smoker,    Pulmonary exam normal breath sounds clear to auscultation       Cardiovascular hypertension, + CAD, + Past MI and +CHF  Normal cardiovascular exam+ dysrhythmias Atrial Fibrillation  Rhythm:Regular Rate:Normal     Neuro/Psych CVA, Residual Symptoms negative psych ROS   GI/Hepatic negative GI ROS, Neg liver ROS, GI bleed. S/p transfusion   Endo/Other  negative endocrine ROS  Renal/GU Renal InsufficiencyRenal disease  negative genitourinary   Musculoskeletal negative musculoskeletal ROS (+)   Abdominal   Peds negative pediatric ROS (+)  Hematology negative hematology ROS (+) anemia ,   Anesthesia Other Findings   Reproductive/Obstetrics negative OB ROS                             Anesthesia Physical Anesthesia Plan  ASA: III  Anesthesia Plan: MAC   Post-op Pain Management:    Induction: Intravenous  Airway Management Planned: Simple Face Mask  Additional Equipment:   Intra-op Plan:   Post-operative Plan:   Informed Consent: I have reviewed the patients History and Physical, chart, labs and discussed the procedure including the risks, benefits and alternatives for the proposed anesthesia with the patient or authorized representative who has indicated his/her understanding and acceptance.   Dental advisory given  Plan Discussed with: CRNA  Anesthesia Plan Comments:         Anesthesia Quick Evaluation

## 2016-05-18 NOTE — Transfer of Care (Signed)
Immediate Anesthesia Transfer of Care Note  Patient: Gregory [name]  Procedure(s) Performed: Procedure(s): COLONOSCOPY (N/A)  Patient Location: Endoscopy Unit  Anesthesia Type:MAC  Level of Consciousness: awake, alert  and oriented  Airway & Oxygen Therapy: Patient Spontanous Breathing and Patient connected to nasal cannula oxygen  Post-op Assessment: Report given to RN, Post -op Vital signs reviewed and stable and Patient moving all extremities X 4  Post vital signs: Reviewed and stable  Last Vitals:  Vitals:   05/18/16 0856 05/18/16 1326  BP: (!) 155/61 (!) 149/65  Pulse: 79   Resp: 15 14  Temp: 36.7 C 37.1 C    Last Pain:  Vitals:   05/18/16 1326  TempSrc: Oral  PainSc:          Complications: No apparent anesthesia complications

## 2016-05-18 NOTE — Telephone Encounter (Signed)
Opened in error

## 2016-05-18 NOTE — Anesthesia Postprocedure Evaluation (Signed)
Anesthesia Post Note  Patient: Gregory [name]  Procedure(s) Performed: Procedure(s) (LRB): COLONOSCOPY (N/A) GIVENS CAPSULE STUDY (N/A)  Patient location during evaluation: Endoscopy Anesthesia Type: MAC Level of consciousness: awake, awake and alert and oriented Pain management: pain level controlled Vital Signs Assessment: post-procedure vital signs reviewed and stable Respiratory status: spontaneous breathing, nonlabored ventilation and respiratory function stable Cardiovascular status: blood pressure returned to baseline Anesthetic complications: no       Last Vitals:  Vitals:   05/18/16 1525 05/18/16 1600  BP: (!) 120/50 (!) 139/51  Pulse: 66 73  Resp: 20 17  Temp:  36.8 C    Last Pain:  Vitals:   05/18/16 1600  TempSrc: Oral  PainSc:                  Elaura Calix COKER

## 2016-05-18 NOTE — Progress Notes (Signed)
Patient completed entire container of Golytely prep before midnight.   He has had numerous bloody stools since completing the prep.

## 2016-05-19 ENCOUNTER — Inpatient Hospital Stay (HOSPITAL_COMMUNITY): Payer: Medicare Other

## 2016-05-19 ENCOUNTER — Encounter (HOSPITAL_COMMUNITY): Payer: Self-pay | Admitting: Radiology

## 2016-05-19 DIAGNOSIS — R933 Abnormal findings on diagnostic imaging of other parts of digestive tract: Secondary | ICD-10-CM

## 2016-05-19 LAB — BASIC METABOLIC PANEL
ANION GAP: 8 (ref 5–15)
BUN: 14 mg/dL (ref 6–20)
CALCIUM: 8 mg/dL — AB (ref 8.9–10.3)
CO2: 24 mmol/L (ref 22–32)
Chloride: 103 mmol/L (ref 101–111)
Creatinine, Ser: 1.56 mg/dL — ABNORMAL HIGH (ref 0.61–1.24)
GFR calc non Af Amer: 40 mL/min — ABNORMAL LOW (ref >60)
GFR, EST AFRICAN AMERICAN: 46 mL/min — AB (ref >60)
Glucose, Bld: 132 mg/dL — ABNORMAL HIGH (ref 65–99)
POTASSIUM: 3.6 mmol/L (ref 3.5–5.1)
Sodium: 135 mmol/L (ref 135–145)

## 2016-05-19 LAB — TYPE AND SCREEN
ABO/RH(D): O NEG
ANTIBODY SCREEN: NEGATIVE
UNIT DIVISION: 0
Unit division: 0
Unit division: 0
Unit division: 0

## 2016-05-19 LAB — BPAM RBC
BLOOD PRODUCT EXPIRATION DATE: 201803152359
BLOOD PRODUCT EXPIRATION DATE: 201803232359
BLOOD PRODUCT EXPIRATION DATE: 201803262359
Blood Product Expiration Date: 201803282359
ISSUE DATE / TIME: 201803030051
ISSUE DATE / TIME: 201803030415
ISSUE DATE / TIME: 201803050534
ISSUE DATE / TIME: 201803051807
UNIT TYPE AND RH: 9500
UNIT TYPE AND RH: 9500
Unit Type and Rh: 9500
Unit Type and Rh: 9500

## 2016-05-19 LAB — CBC
HEMATOCRIT: 27.8 % — AB (ref 39.0–52.0)
HEMOGLOBIN: 9 g/dL — AB (ref 13.0–17.0)
MCH: 27.6 pg (ref 26.0–34.0)
MCHC: 32.4 g/dL (ref 30.0–36.0)
MCV: 85.3 fL (ref 78.0–100.0)
Platelets: 192 10*3/uL (ref 150–400)
RBC: 3.26 MIL/uL — AB (ref 4.22–5.81)
RDW: 16.6 % — ABNORMAL HIGH (ref 11.5–15.5)
WBC: 11.1 10*3/uL — ABNORMAL HIGH (ref 4.0–10.5)

## 2016-05-19 MED ORDER — IOPAMIDOL (ISOVUE-300) INJECTION 61%
INTRAVENOUS | Status: AC
  Administered 2016-05-19: 100 mL
  Filled 2016-05-19: qty 100

## 2016-05-19 MED ORDER — BARIUM SULFATE 0.1 % PO SUSP
ORAL | Status: AC
  Administered 2016-05-19: 1 mL
  Filled 2016-05-19: qty 3

## 2016-05-19 MED ORDER — SODIUM CHLORIDE 0.9 % IV BOLUS (SEPSIS)
250.0000 mL | Freq: Once | INTRAVENOUS | Status: AC
  Administered 2016-05-19: 250 mL via INTRAVENOUS

## 2016-05-19 MED ORDER — ACETAMINOPHEN 325 MG PO TABS
650.0000 mg | ORAL_TABLET | ORAL | Status: DC | PRN
  Administered 2016-05-19: 650 mg via ORAL
  Filled 2016-05-19: qty 2

## 2016-05-19 NOTE — Progress Notes (Signed)
Patient medicated with 650mg  Tylenol and 270ml IV fluid bolus started per order.  Blood cultures to be drawn.

## 2016-05-19 NOTE — Progress Notes (Signed)
PROGRESS NOTE  Gregory [name]  UXN:[id number] DOB: June 02, 1932 DOA: 05/15/2016 PCP: Lamar Blinks, MD Outpatient Specialists:  Subjective: Seen with his daughter at bedside this morning, no BM since bowel prep Denies any complaints this morning, both EGD and colonoscopy without acute bleeding source. Capsule endoscopy and today to be read later today.   Brief Narrative:  Gregory [name] is a 83 y.o. male with medical history significant of CAD, PVD, chronic afib on xaralto comes in with a week of progressive worsening sob , doe, dizziness upon standing, weakness upon standing.  Denies any LOC.  Denies any abdominal pain.  Denies any n/v, no melena or brbpr.  Pt has h/o EGD about 2 years ago for dilation of an esophageal stricture.  He denies any heartburn chronically but is on protonix daily.  He went to see his PCP yesterday who checked labs, his hgb was low so he was called to come to ED.  His hgb 5 months ago was over 11, today it is less than 7.  Referred for admission for GIB.  Assessment & Plan:   Principal Problem:   GIB (gastrointestinal bleeding) Active Problems:   CAD (coronary artery disease),CABG 1993-LIMA to the LAD, SVG to Om and SVG to PDA/PLA, patent on cath 2014.   Chronic a-fib (HCC)   PVD - 25% LICA, 42% RICA by doppler 5/14   Anticoagulated on Xarelto   Coronary artery disease involving native coronary artery   Chronic renal failure, stage 3 (moderate)   GI bleed -Baseline hemoglobin around 11 from 5 months ago. -Has anemia of hemoglobin of 6.3 with low iron and positive FOBT, likely from GI bleed. -Gastroenterology consulted. -Started on Protonix drip, will switch to twice a day, EGD later today by GI. -Continue to hold aspirin and Xarelto. -EGD and colonoscopy without acute bleeding, capsule endoscopy done to be read later today. -If no bleeding and hemoglobin is stable, can be discharged in 1-2 days.  Anemia -Anemia of acute blood loss secondary to GI  bleed, hemoglobin nadir was 6.3. -Status post transfusion of total 4 units of packed RBCs so far, hemoglobin 9.0. -Check CBC in a.m.  CAD -CABG 1993-LIMA to the LAD, SVG to Om and SVG to PDA/PLA, patent on cath 2014. -Holding all blood thinners, on aspirin.  Chronic a-fib -Rate is controlled with beta blockers. History of splenic infarct. -CHA2DS2-VASc is >3, is on Xarelto, hold because of concurrent bleed.  PVD -70-62% LICA stenosis, 37% RICA by doppler and 12/02/2015, follows with cardiology as outpatient. -Follows with Dr. Claiborne Billings, per last note he does not believe he wants surgery. -Restart aspirin when clinically appropriate.  Chronic anticoagulation -On chronic anticoagulation for his chronic atrial fibrillation and PVD. -On Xarelto, this is on hold.  Coronary artery disease involving native coronary artery- as above  Chronic renal failure, stage 3 -Currently around his baseline creatinine is 1.52.   Restless leg syndrome -Patient is on Requip, restarted, likely this is being exacerbated by iron deficiency. -Nulecit iron infusion to be given   DVT prophylaxis: SCDs Code Status: Full Code Family Communication:  Disposition Plan:  Diet: Diet regular Room service appropriate? Yes; Fluid consistency: Thin  Consultants:   GI  Procedures:   None  Antimicrobials:   None   Objective: Vitals:   05/19/16 0138 05/19/16 0311 05/19/16 0435 05/19/16 0954  BP: (!) 139/41  (!) 105/41 (!) 104/40  Pulse:   79 74  Resp:   19 19  Temp:  (!) 100.8 F (38.2 C)  98.7 F (37.1 C) 97.4 F (36.3 C)  TempSrc:   Oral Oral  SpO2:   97% 96%  Weight:   87.1 kg (192 lb 0.3 oz)   Height:        Intake/Output Summary (Last 24 hours) at 05/19/16 1109 Last data filed at 05/19/16 0830  Gross per 24 hour  Intake             1028 ml  Output             2600 ml  Net            -1572 ml   Filed Weights   05/18/16 0506 05/18/16 1326 05/19/16 0435  Weight: 88.7 kg (195 lb 8.8 oz)  88.5 kg (195 lb) 87.1 kg (192 lb 0.3 oz)    Examination: General exam: Appears calm and comfortable  Respiratory system: Clear to auscultation. Respiratory effort normal. Cardiovascular system: S1 & S2 heard, RRR. No JVD, murmurs, rubs, gallops or clicks. No pedal edema. Gastrointestinal system: Abdomen is nondistended, soft and nontender. No organomegaly or masses felt. Normal bowel sounds heard. Central nervous system: Alert and oriented. No focal neurological deficits. Extremities: Symmetric 5 x 5 power. Skin: No rashes, lesions or ulcers Psychiatry: Judgement and insight appear normal. Mood & affect appropriate.   Data Reviewed: I have personally reviewed following labs and imaging studies  CBC:  Recent Labs Lab 05/15/16 1631 05/15/16 2324  05/16/16 1004 05/17/16 0402 05/18/16 0326 05/18/16 1031 05/19/16 0222  WBC 6.9 5.6  --  7.1 8.2 7.2 5.8 11.1*  NEUTROABS 3,381 2.5  --   --   --   --   --   --   HGB 6.7* 6.3*  < > 8.5* 8.4* 6.8* 8.0* 9.0*  HCT 20.9* 19.9*  < > 26.2* 25.5* 21.0* 25.0* 27.8*  MCV 86.0 88.4  --  86.2 86.4 86.4 86.5 85.3  PLT 242 237  --  206 218 190 197 192  < > = values in this interval not displayed. Basic Metabolic Panel:  Recent Labs Lab 05/15/16 2324 05/15/16 2335 05/16/16 1004 05/17/16 0402 05/18/16 0326 05/19/16 0222  NA 141 140 141 139 139 135  K 4.0 3.9 4.1 3.9 3.6 3.6  CL 107 109 113* 111 110 103  CO2 25  --  23 23 25 24   GLUCOSE 96 92 99 103* 107* 132*  BUN 31* 32* 28* 23* 22* 14  CREATININE 1.91* 1.90* 1.60* 1.52* 1.51* 1.56*  CALCIUM 8.8*  --  8.6* 8.5* 8.0* 8.0*   GFR: Estimated Creatinine Clearance: 37.7 mL/min (by C-G formula based on SCr of 1.56 mg/dL (H)). Liver Function Tests:  Recent Labs Lab 05/15/16 1631 05/15/16 2324  AST 14 17  ALT 8* 10*  ALKPHOS 63 59  BILITOT 0.5 0.4  PROT 6.2 6.1*  ALBUMIN 3.3* 2.9*   No results for input(s): LIPASE, AMYLASE in the last 168 hours. No results for input(s): AMMONIA in  the last 168 hours. Coagulation Profile: No results for input(s): INR, PROTIME in the last 168 hours. Cardiac Enzymes: No results for input(s): CKTOTAL, CKMB, CKMBINDEX, TROPONINI in the last 168 hours. BNP (last 3 results) No results for input(s): PROBNP in the last 8760 hours. HbA1C: No results for input(s): HGBA1C in the last 72 hours. CBG:  Recent Labs Lab 05/16/16 1721  GLUCAP 90   Lipid Profile: No results for input(s): CHOL, HDL, LDLCALC, TRIG, CHOLHDL, LDLDIRECT in the last 72 hours. Thyroid Function Tests: No results for  input(s): TSH, T4TOTAL, FREET4, T3FREE, THYROIDAB in the last 72 hours. Anemia Panel: No results for input(s): VITAMINB12, FOLATE, FERRITIN, TIBC, IRON, RETICCTPCT in the last 72 hours. Urine analysis:    Component Value Date/Time   COLORURINE YELLOW 05/15/2016 [place] 05/15/2016 1631   LABSPEC 1.013 05/15/2016 1631   PHURINE 5.5 05/15/2016 1631   GLUCOSEU NEGATIVE 05/15/2016 1631   HGBUR 1+ (A) 05/15/2016 1631   BILIRUBINUR NEGATIVE 05/15/2016 1631   BILIRUBINUR negative 01/20/2013 1623   KETONESUR NEGATIVE 05/15/2016 1631   PROTEINUR 1+ (A) 05/15/2016 1631   UROBILINOGEN 0.2 01/20/2013 1623   UROBILINOGEN 1.0 03/30/2011 2224   NITRITE NEGATIVE 05/15/2016 1631   LEUKOCYTESUR NEGATIVE 05/15/2016 1631   Sepsis Labs: @LABRCNTIP (procalcitonin:4,lacticidven:4)  )No results found for this or any previous visit (from the past 240 hour(s)).   Invalid input(s): PROCALCITONIN, [place]   Radiology Studies: No results found.      Scheduled Meds: . carvedilol  3.125 mg Oral BID WC  . lisinopril  5 mg Oral Daily  . pantoprazole (PROTONIX) IV  40 mg Intravenous Q12H  . rOPINIRole  0.25 mg Oral BID  . traZODone  25 mg Oral QHS   Continuous Infusions:    LOS: 3 days    Time spent: 35 minutes    Gregory [name] A, MD [facility] Pager [phone]  If 7PM-7AM, please contact  night-coverage [email or web address] Password [email or web address] 05/19/2016, 11:09 AM

## 2016-05-19 NOTE — Progress Notes (Signed)
Patient temp up to 102.9; O2 sats in 80's on room air;95% on 2Liters. Text paged K.Schorr on call for [facility].

## 2016-05-19 NOTE — Progress Notes (Signed)
          Daily Rounding Note  05/19/2016, 10:23 AM  LOS: 3 days   SUBJECTIVE:   Chief complaint:  Rectal bleeding.     No bleeding or stooling since pre colonoscopy.  Just ate solids for first time this AM.    OBJECTIVE:         Vital signs in last 24 hours:    Temp:  [97.4 F (36.3 C)-102.9 F (39.4 C)] 97.4 F (36.3 C) (03/06 0954) Pulse Rate:  [66-100] 74 (03/06 0954) Resp:  [14-20] 19 (03/06 0954) BP: (104-159)/(40-89) 104/40 (03/06 0954) SpO2:  [81 %-97 %] 96 % (03/06 0954) Weight:  [87.1 kg (192 lb 0.3 oz)-88.5 kg (195 lb)] 87.1 kg (192 lb 0.3 oz) (03/06 0435) Last BM Date: 05/18/16 Filed Weights   05/18/16 0506 05/18/16 1326 05/19/16 0435  Weight: 88.7 kg (195 lb 8.8 oz) 88.5 kg (195 lb) 87.1 kg (192 lb 0.3 oz)   General: looks well Chest: no resp distress Heart: RRR Abdomen: NT.  Active BS  Extremities: no CCE Neuro/Psych:  Alert, conversant.  Appropriate.    Intake/Output from previous day: 03/05 0701 - 03/06 0700 In: 1378 [P.O.:360; I.V.:200; Blood:818] Out: 2100 [Urine:2100]  Intake/Output this shift: Total I/O In: -  Out: 500 [Urine:500]  Lab Results:  Recent Labs  05/18/16 0326 05/18/16 1031 05/19/16 0222  WBC 7.2 5.8 11.1*  HGB 6.8* 8.0* 9.0*  HCT 21.0* 25.0* 27.8*  PLT 190 197 192   BMET  Recent Labs  05/17/16 0402 05/18/16 0326 05/19/16 0222  NA 139 139 135  K 3.9 3.6 3.6  CL 111 110 103  CO2 23 25 24   GLUCOSE 103* 107* 132*  BUN 23* 22* 14  CREATININE 1.52* 1.51* 1.56*  CALCIUM 8.5* 8.0* 8.0*   LFT No results for input(s): PROT, ALBUMIN, AST, ALT, ALKPHOS, BILITOT, BILIDIR, IBILI in the last 72 hours. PT/INR No results for input(s): LABPROT, INR in the last 72 hours. Hepatitis Panel No results for input(s): HEPBSAG, HCVAB, HEPAIGM, HEPBIGM in the last 72 hours.  Studies/Results: No results found.  ASSESMENT:   *  Blood in stool/rectal bleeding, profound anemia.  RBC x  4.   EGD:  Normal.    Colonoscopy: divertyiculosis, internal hemorrhoids. Hot snare 6 mm rectosigoid polyp.  Path pending.   Capsule endo: hopefully will get read today.    *  Chronic a fib.  Zaroxolyn on hold.    PLAN   *  Decision re restart zaroxolyn once capsule study has been read.     Azucena Freed  05/19/2016, 10:23 AM Pager: [phone]

## 2016-05-20 ENCOUNTER — Inpatient Hospital Stay (HOSPITAL_COMMUNITY): Payer: Medicare Other | Admitting: Anesthesiology

## 2016-05-20 ENCOUNTER — Encounter (HOSPITAL_COMMUNITY)
Admission: EM | Disposition: A | Discharge status: Home / Self Care | Payer: Self-pay | Source: Home / Self Care | Attending: Internal Medicine

## 2016-05-20 ENCOUNTER — Encounter (HOSPITAL_COMMUNITY): Payer: Self-pay | Admitting: *Deleted

## 2016-05-20 DIAGNOSIS — D62 Acute posthemorrhagic anemia: Secondary | ICD-10-CM

## 2016-05-20 HISTORY — PX: ENTEROSCOPY: SHX5533

## 2016-05-20 LAB — BASIC METABOLIC PANEL
ANION GAP: 8 (ref 5–15)
BUN: 11 mg/dL (ref 6–20)
CALCIUM: 8.2 mg/dL — AB (ref 8.9–10.3)
CO2: 28 mmol/L (ref 22–32)
CREATININE: 1.45 mg/dL — AB (ref 0.61–1.24)
Chloride: 99 mmol/L — ABNORMAL LOW (ref 101–111)
GFR, EST AFRICAN AMERICAN: 50 mL/min — AB (ref >60)
GFR, EST NON AFRICAN AMERICAN: 43 mL/min — AB (ref >60)
Glucose, Bld: 101 mg/dL — ABNORMAL HIGH (ref 65–99)
Potassium: 3.8 mmol/L (ref 3.5–5.1)
SODIUM: 135 mmol/L (ref 135–145)

## 2016-05-20 LAB — CBC
HEMATOCRIT: 27.7 % — AB (ref 39.0–52.0)
Hemoglobin: 9 g/dL — ABNORMAL LOW (ref 13.0–17.0)
MCH: 28 pg (ref 26.0–34.0)
MCHC: 32.5 g/dL (ref 30.0–36.0)
MCV: 86 fL (ref 78.0–100.0)
Platelets: 202 10*3/uL (ref 150–400)
RBC: 3.22 MIL/uL — ABNORMAL LOW (ref 4.22–5.81)
RDW: 16.2 % — AB (ref 11.5–15.5)
WBC: 8.6 10*3/uL (ref 4.0–10.5)

## 2016-05-20 SURGERY — ENTEROSCOPY
Anesthesia: Moderate Sedation

## 2016-05-20 SURGERY — ENTEROSCOPY
Anesthesia: Monitor Anesthesia Care

## 2016-05-20 MED ORDER — FENTANYL CITRATE (PF) 100 MCG/2ML IJ SOLN
INTRAMUSCULAR | Status: DC | PRN
  Administered 2016-05-20: 12.5 ug via INTRAVENOUS
  Administered 2016-05-20: 25 ug via INTRAVENOUS
  Administered 2016-05-20: 12.5 ug via INTRAVENOUS

## 2016-05-20 MED ORDER — ONDANSETRON HCL 4 MG/2ML IJ SOLN: 4.0000 mg | Freq: Once | INTRAMUSCULAR | Status: DC | PRN

## 2016-05-20 MED ORDER — MIDAZOLAM HCL 10 MG/2ML IJ SOLN
INTRAMUSCULAR | Status: DC | PRN
  Administered 2016-05-20 (×2): 1 mg via INTRAVENOUS
  Administered 2016-05-20: 2 mg via INTRAVENOUS
  Administered 2016-05-20: 1 mg via INTRAVENOUS

## 2016-05-20 MED ORDER — HEPARIN (PORCINE) IN NACL 100-0.45 UNIT/ML-% IJ SOLN
1150.0000 [IU]/h | INTRAMUSCULAR | Status: AC
  Administered 2016-05-20: 950 [IU]/h via INTRAVENOUS
  Filled 2016-05-20 (×2): qty 250

## 2016-05-20 MED ORDER — IBUPROFEN 200 MG PO TABS: 200.0000 mg | ORAL_TABLET | Freq: Four times a day (QID) | ORAL | Status: DC | PRN

## 2016-05-20 MED ORDER — BUTAMBEN-TETRACAINE-BENZOCAINE 2-2-14 % EX AERO
INHALATION_SPRAY | CUTANEOUS | Status: DC | PRN
  Administered 2016-05-20: 2 via TOPICAL

## 2016-05-20 MED ORDER — IBUPROFEN 100 MG/5ML PO SUSP
200.0000 mg | Freq: Four times a day (QID) | ORAL | Status: DC | PRN
  Filled 2016-05-20: qty 20

## 2016-05-20 MED ORDER — MIDAZOLAM HCL 5 MG/ML IJ SOLN
INTRAMUSCULAR | Status: AC
  Filled 2016-05-20: qty 1

## 2016-05-20 MED ORDER — FENTANYL CITRATE (PF) 100 MCG/2ML IJ SOLN
INTRAMUSCULAR | Status: AC
  Filled 2016-05-20: qty 2

## 2016-05-20 MED ORDER — LACTATED RINGERS IV SOLN: INTRAVENOUS | Status: DC

## 2016-05-20 MED ORDER — SODIUM CHLORIDE 0.9 % IV SOLN: INTRAVENOUS | Status: DC

## 2016-05-20 MED ORDER — FENTANYL CITRATE (PF) 100 MCG/2ML IJ SOLN: 25.0000 ug | INTRAMUSCULAR | Status: DC | PRN

## 2016-05-20 MED ORDER — SODIUM CHLORIDE 0.9 % IV SOLN
510.0000 mg | Freq: Once | INTRAVENOUS | Status: AC
  Administered 2016-05-20: 510 mg via INTRAVENOUS
  Filled 2016-05-20 (×2): qty 17

## 2016-05-20 NOTE — Progress Notes (Signed)
Progress Note   Subjective  Patient had capsule endoscopy done yesterday, showing a ? subepithelial nodule in the proximal small bowel with red marking and suspected erosion. Otherwise normal study without other pathology noted. CT enterography performed last night.    Objective   Vital signs in last 24 hours: Temp:  [97.4 F (36.3 C)-99.5 F (37.5 C)] 98.6 F (37 C) (03/07 0453) Pulse Rate:  [65-78] 70 (03/07 0453) Resp:  [18-19] 18 (03/07 0453) BP: (104-118)/(39-62) 118/62 (03/07 0453) SpO2:  [93 %-100 %] 100 % (03/07 0453) Weight:  [179 lb 3.7 oz (81.3 kg)] 179 lb 3.7 oz (81.3 kg) (03/07 0453) Last BM Date: 05/19/16 General:    white male in NAD Heart:  Regular rate and rhythm;  Lungs: Respirations even and unlabored,  Abdomen:  Soft, nontender and nondistended.  Extremities:  Without edema. Neurologic:  Alert and oriented,  grossly normal neurologically. Psych:  Cooperative. Normal mood and affect.  Intake/Output from previous day: 03/06 0701 - 03/07 0700 In: 360 [P.O.:360] Out: 503 [Urine:502; Stool:1] Intake/Output this shift: No intake/output data recorded.  Lab Results:  Recent Labs  05/18/16 0326 05/18/16 1031 05/19/16 0222  WBC 7.2 5.8 11.1*  HGB 6.8* 8.0* 9.0*  HCT 21.0* 25.0* 27.8*  PLT 190 197 192   BMET  Recent Labs  05/18/16 0326 05/19/16 0222  NA 139 135  K 3.6 3.6  CL 110 103  CO2 25 24  GLUCOSE 107* 132*  BUN 22* 14  CREATININE 1.51* 1.56*  CALCIUM 8.0* 8.0*   LFT No results for input(s): PROT, ALBUMIN, AST, ALT, ALKPHOS, BILITOT, BILIDIR, IBILI in the last 72 hours. PT/INR No results for input(s): LABPROT, INR in the last 72 hours.  Studies/Results: Ct Entero Abd/pelvis W Contast  Result Date: 05/20/2016 CLINICAL DATA:  Possible submucosal lesion on capsule endoscopy in the small bowel. Severe anemia. EXAM: CT ABDOMEN AND PELVIS WITH CONTRAST (ENTEROGRAPHY) TECHNIQUE: Multidetector CT of the abdomen and pelvis during bolus  administration of intravenous contrast. Negative oral contrast was given. CONTRAST:  100 ISOVUE-300 IOPAMIDOL (ISOVUE-300) INJECTION 61% COMPARISON:  CT abdomen pelvis 08/10/2015. FINDINGS: Lower chest: Image quality is degraded by respiratory motion. There is peribronchovascular ground-glass nodularity and consolidation bilaterally, right greater than left. Heart is enlarged. No pericardial effusion. Small bilateral pleural effusions. Fluid is seen in the distal esophagus. Hepatobiliary: Liver and gallbladder are unremarkable. No biliary ductal dilatation. Pancreas: Negative. Spleen: Negative. Adrenals/Urinary Tract: Adrenal glands are unremarkable. Low-attenuation lesions in the kidneys measure up to 2.0 cm on the right and are likely cysts. Likely renal vascular calcifications on the right. Ureters are decompressed. Bladder is unremarkable. Stomach/Bowel: Stomach is unremarkable. There is poor small bowel distention, limiting evaluation. Endoscopic capsule is seen in the descending colon. Colon is otherwise unremarkable. Appendix is not visualized. Vascular/Lymphatic: Atherosclerotic calcification of the arterial vasculature without abdominal aortic aneurysm. Circumaortic left renal vein. No pathologically enlarged lymph nodes. Reproductive: Prostate is visualized. Other: Small bilateral inguinal hernias contain fat. Mesenteries and peritoneum are unremarkable. Musculoskeletal: No worrisome lytic or sclerotic lesions. Degenerative changes are seen in the spine. IMPRESSION: 1. Poor distention limits evaluation of the small bowel. 2. Bibasilar pulmonary parenchymal peribronchovascular ground-glass nodularity and consolidation, indicative of an infectious bronchiolitis/bronchopneumonia. 3. Small bilateral pleural effusions. 4.  Aortic atherosclerosis (ICD10-170.0). Electronically Signed   By: Lorin Picket M.D.   On: 05/20/2016 07:19       Assessment / Plan:   83 y/o male with multiple medical problems  including CAD and PVD, chronic AF on Xarelto (last dosed 05/15/16), who presented with symptomatic anemia and an episode of hematochezia. EGD and colonoscopy were without any clear etiology. Capsule study performed showing a ? subepithelial nodule in the proximal small bowel with red marking and suspected erosion, without other pathology. I suspect this lesion may be the culprit for his symptoms. Unclear what this lesion represents without further evaluation (GIST, carcinoid, leiomyoma, other are on ddx). CT enterography done which did not distend the small bowel well, but no obvious mass lesions or lymphadenopathy noted which is reassuring. Possible pneumonia incidentally noted.   I discussed options moving forward with patient and wife. I offered him a push enteroscopy today to see if we can reach the area of concern on capsule. Following discussion of risks / benefits he wished to proceed, tentatively scheduled for 2 PM today. If we cannot reach the area of concern on this exam, balloon enteroscopy would be the other way to evaluate this, which is not available at our hospital. He's had a stable Hgb since being off Xarelto, I think he could be discharged in this setting and get this done as an outpatient if needed. We could consider putting him on an aspirin in place of Xarelto which would have less bleeding risk. If it is thought per primary service that he is too high risk for cardiovascular event without full anticoagulation, we will need to discuss timing of resuming this given risk for recurrent bleeding. Further recommendations pending enteroscopy today.   Shawano Cellar, MD [facility] Pager [phone]

## 2016-05-20 NOTE — Anesthesia Preprocedure Evaluation (Signed)
Anesthesia Evaluation  Patient identified by MRN, date of birth, ID band Patient awake    Reviewed: Allergy & Precautions, NPO status , Patient's Chart, lab work & pertinent test results  Airway Mallampati: II  TM Distance: >3 FB Neck ROM: Full    Dental no notable dental hx.    Pulmonary former smoker,    Pulmonary exam normal breath sounds clear to auscultation       Cardiovascular hypertension, Pt. on medications and Pt. on home beta blockers + CAD, + Past MI and +CHF  Normal cardiovascular exam+ dysrhythmias Atrial Fibrillation  Rhythm:Regular Rate:Normal     Neuro/Psych CVA, Residual Symptoms negative psych ROS   GI/Hepatic negative GI ROS, Neg liver ROS, GI bleed. S/p transfusion   Endo/Other  negative endocrine ROS  Renal/GU Renal InsufficiencyRenal disease  negative genitourinary   Musculoskeletal   Abdominal Normal abdominal exam  (+)   Peds negative pediatric ROS (+)  Hematology negative hematology ROS (+) anemia ,   Anesthesia Other Findings   Reproductive/Obstetrics negative OB ROS                             Anesthesia Physical  Anesthesia Plan  ASA: III  Anesthesia Plan: MAC   Post-op Pain Management:    Induction: Intravenous  Airway Management Planned: Simple Face Mask and Natural Airway  Additional Equipment:   Intra-op Plan:   Post-operative Plan:   Informed Consent: I have reviewed the patients History and Physical, chart, labs and discussed the procedure including the risks, benefits and alternatives for the proposed anesthesia with the patient or authorized representative who has indicated his/her understanding and acceptance.   Dental advisory given  Plan Discussed with: CRNA and Surgeon  Anesthesia Plan Comments:         Anesthesia Quick Evaluation

## 2016-05-20 NOTE — Care Management Important Message (Signed)
Important Message  Patient Details  Name: Gregory [name] MRN: [id number] Date of Birth: [birth year]   Medicare Important Message Given:       Orbie Pyo 05/20/2016, 12:54 PM

## 2016-05-20 NOTE — Interval H&P Note (Signed)
History and Physical Interval Note:  05/20/2016 2:00 PM  Gregory [name]  has presented today for surgery, with the diagnosis of GI bleeding, abnormal capsule study  The various methods of treatment have been discussed with the patient and family. After consideration of risks, benefits and other options for treatment, the patient has consented to  Procedure(s): ENTEROSCOPY (N/A) as a surgical intervention .  The patient's history has been reviewed, patient examined, no change in status, stable for surgery.  I have reviewed the patient's chart and labs.  Questions were answered to the patient's satisfaction.     [name]

## 2016-05-20 NOTE — Progress Notes (Addendum)
[facility] PROGRESS NOTE  Gregory [name] FTD:[id number] DOB: 07/12/1932 DOA: 05/15/2016  PCP: Lamar Blinks, MD  Brief History/Interval Summary: 83 year old Caucasian male with a past medical history of coronary artery disease, peripheral vascular disease, chronic atrial fibrillation on Xarelto, who presented with one-week history of progressively worsening shortness of breath, dizziness and weakness. Patient went to his primary care provider and was found to have low hemoglobin. He was hospitalized for further management.  Reason for Visit: GI bleed  Consultants: Gastroenterology  Procedures:  Colonoscopy Impression:                - Preparation of the colon was fair leading to                            several minutes of lavage with clearing, adequate                            views obtained.                           - The examined portion of the ileum was normal.                           - Diverticulosis in the left colon.                           - One 6 mm polyp at the recto-sigmoid colon,                            removed with a hot snare. Resected and retrieved.                           - Internal hemorrhoids, moderate in size.                           - The examination was otherwise normal.                           Brown stool in the colon, no obvious evidence of                            bleeding  EGD Impression:                - Normal appearing, widely patent esophagus and GEJ.                           - Normal stomach.                           - Normal examined duodenum.                           - No fresh or old heme in the upper GI tract.                           - No specimens collected.  Antibiotics: None  Subjective/Interval History: Patient  states that he is feeling hungry. He denies any abdominal pain, nausea, vomiting. His wife is at the bedside.  ROS: Denies any shortness of breath or chest pain.  Objective:  Vital  Signs  Vitals:   05/19/16 2133 05/20/16 0453 05/20/16 1305 05/20/16 1417  BP: (!) 114/48 118/62 (!) 128/31 (!) 124/41  Pulse: 78 70 62 61  Resp: 18 18 (!) 22 20  Temp: 99.5 F (37.5 C) 98.6 F (37 C) 98.1 F (36.7 C)   TempSrc: Oral Oral Oral   SpO2: 97% 100% 98% 98%  Weight:  81.3 kg (179 lb 3.7 oz)    Height:        Intake/Output Summary (Last 24 hours) at 05/20/16 1432 Last data filed at 05/20/16 0815  Gross per 24 hour  Intake              120 ml  Output                3 ml  Net              117 ml   Filed Weights   05/18/16 1326 05/19/16 0435 05/20/16 0453  Weight: 88.5 kg (195 lb) 87.1 kg (192 lb 0.3 oz) 81.3 kg (179 lb 3.7 oz)    General appearance: alert, cooperative, appears stated age and no distress Resp: clear to auscultation bilaterally Cardio: regular rate and rhythm, S1, S2 normal, no murmur, click, rub or gallop GI: soft, non-tender; bowel sounds normal; no masses,  no organomegaly Extremities: extremities normal, atraumatic, no cyanosis or edema Neurologic: Awake and alert. Oriented 3. No focal neurological deficits.  Lab Results:  Data Reviewed: I have personally reviewed following labs and imaging studies  CBC:  Recent Labs Lab 05/15/16 1631 05/15/16 2324  05/17/16 0402 05/18/16 0326 05/18/16 1031 05/19/16 0222 05/20/16 1054  WBC 6.9 5.6  < > 8.2 7.2 5.8 11.1* 8.6  NEUTROABS 3,381 2.5  --   --   --   --   --   --   HGB 6.7* 6.3*  < > 8.4* 6.8* 8.0* 9.0* 9.0*  HCT 20.9* 19.9*  < > 25.5* 21.0* 25.0* 27.8* 27.7*  MCV 86.0 88.4  < > 86.4 86.4 86.5 85.3 86.0  PLT 242 237  < > 218 190 197 192 202  < > = values in this interval not displayed.  Basic Metabolic Panel:  Recent Labs Lab 05/16/16 1004 05/17/16 0402 05/18/16 0326 05/19/16 0222 05/20/16 1054  NA 141 139 139 135 135  K 4.1 3.9 3.6 3.6 3.8  CL 113* 111 110 103 99*  CO2 23 23 25 24 28   GLUCOSE 99 103* 107* 132* 101*  BUN 28* 23* 22* 14 11  CREATININE 1.60* 1.52* 1.51* 1.56*  1.45*  CALCIUM 8.6* 8.5* 8.0* 8.0* 8.2*    GFR: Estimated Creatinine Clearance: 39.3 mL/min (by C-G formula based on SCr of 1.45 mg/dL (H)).  Liver Function Tests:  Recent Labs Lab 05/15/16 1631 05/15/16 2324  AST 14 17  ALT 8* 10*  ALKPHOS 63 59  BILITOT 0.5 0.4  PROT 6.2 6.1*  ALBUMIN 3.3* 2.9*    CBG:  Recent Labs Lab 05/16/16 1721  GLUCAP 90     Recent Results (from the past 240 hour(s))  Culture, blood (routine x 2)     Status: None (Preliminary result)   Collection Time: 05/19/16  2:28 AM  Result Value Ref Range Status   Specimen Description BLOOD LEFT ANTECUBITAL  Final  Special Requests IN PEDIATRIC BOTTLE 3CC  Final   Culture NO GROWTH 1 DAY  Final   Report Status PENDING  Incomplete  Culture, blood (routine x 2)     Status: None (Preliminary result)   Collection Time: 05/19/16  2:31 AM  Result Value Ref Range Status   Specimen Description BLOOD LEFT HAND  Final   Special Requests IN PEDIATRIC BOTTLE 3CC  Final   Culture NO GROWTH 1 DAY  Final   Report Status PENDING  Incomplete      Radiology Studies: Ct Entero Abd/pelvis W Contast  Result Date: 05/20/2016 CLINICAL DATA:  Possible submucosal lesion on capsule endoscopy in the small bowel. Severe anemia. EXAM: CT ABDOMEN AND PELVIS WITH CONTRAST (ENTEROGRAPHY) TECHNIQUE: Multidetector CT of the abdomen and pelvis during bolus administration of intravenous contrast. Negative oral contrast was given. CONTRAST:  100 ISOVUE-300 IOPAMIDOL (ISOVUE-300) INJECTION 61% COMPARISON:  CT abdomen pelvis 08/10/2015. FINDINGS: Lower chest: Image quality is degraded by respiratory motion. There is peribronchovascular ground-glass nodularity and consolidation bilaterally, right greater than left. Heart is enlarged. No pericardial effusion. Small bilateral pleural effusions. Fluid is seen in the distal esophagus. Hepatobiliary: Liver and gallbladder are unremarkable. No biliary ductal dilatation. Pancreas: Negative. Spleen:  Negative. Adrenals/Urinary Tract: Adrenal glands are unremarkable. Low-attenuation lesions in the kidneys measure up to 2.0 cm on the right and are likely cysts. Likely renal vascular calcifications on the right. Ureters are decompressed. Bladder is unremarkable. Stomach/Bowel: Stomach is unremarkable. There is poor small bowel distention, limiting evaluation. Endoscopic capsule is seen in the descending colon. Colon is otherwise unremarkable. Appendix is not visualized. Vascular/Lymphatic: Atherosclerotic calcification of the arterial vasculature without abdominal aortic aneurysm. Circumaortic left renal vein. No pathologically enlarged lymph nodes. Reproductive: Prostate is visualized. Other: Small bilateral inguinal hernias contain fat. Mesenteries and peritoneum are unremarkable. Musculoskeletal: No worrisome lytic or sclerotic lesions. Degenerative changes are seen in the spine. IMPRESSION: 1. Poor distention limits evaluation of the small bowel. 2. Bibasilar pulmonary parenchymal peribronchovascular ground-glass nodularity and consolidation, indicative of an infectious bronchiolitis/bronchopneumonia. 3. Small bilateral pleural effusions. 4.  Aortic atherosclerosis (ICD10-170.0). Electronically Signed   By: Lorin Picket M.D.   On: 05/20/2016 07:19     Medications:  Scheduled: . [MAR Hold] carvedilol  3.125 mg Oral BID WC  . [MAR Hold] lisinopril  5 mg Oral Daily  . [MAR Hold] pantoprazole (PROTONIX) IV  40 mg Intravenous Q12H  . [MAR Hold] rOPINIRole  0.25 mg Oral BID  . [MAR Hold] traZODone  25 mg Oral QHS   Continuous: . sodium chloride    . lactated ringers     PRN:[MAR Hold] acetaminophen, butamben-tetracaine-benzocaine, fentaNYL, [MAR Hold] HYDROcodone-acetaminophen, [MAR Hold] LORazepam, midazolam, [MAR Hold] ondansetron **OR** [MAR Hold] ondansetron (ZOFRAN) IV, [MAR Hold] sodium chloride flush  Assessment/Plan:  Principal Problem:   GIB (gastrointestinal bleeding) Active  Problems:   CAD (coronary artery disease),CABG 1993-LIMA to the LAD, SVG to Om and SVG to PDA/PLA, patent on cath 2014.   Chronic a-fib (HCC)   PVD - 60% LICA, 63% RICA by doppler 5/14   Anticoagulated on Xarelto   Coronary artery disease involving native coronary artery   Chronic renal failure, stage 3 (moderate)   Abnormal finding on GI tract imaging    GI bleed -Baseline hemoglobin around 11 from 5 months ago. -Has anemia of hemoglobin of 6.3 with low iron and positive FOBT, likely from GI bleed. -Gastroenterology was consulted. -Started on Protonix drip, switched to twice a day -Continue to hold  aspirin and Xarelto. -EGD and colonoscopy without acute bleeding, capsule endoscopy revealed a questionable subepithelial nodule in the proximal small bowel with red marking and suspected erosion. CT enterography did not show any acute findings. Plan is for small bowel enteroscopy today.   Anemia kindred acute blood loss -Anemia of acute blood loss secondary to GI bleed, hemoglobin nadir was 6.3. -Status post transfusion of total 4 units of packed RBCs so far, hemoglobin 9.0. Hemoglobin is stable.  CAD -CABG 1993-LIMA to the LAD, SVG to Om and SVG to PDA/PLA, patent on cath 2014. -Holding all blood thinners, on aspirin.  Chronic a-fib -Rate is controlled with beta blockers. History of renal infarct. -CHA2DS2-VASc is >3, is on Xarelto, hold because of concurrent bleed. Would hesitate holding anticoagulation for prolonged period of time due to his history of renal infarct and splenic infarcts.  PVD -33-54% LICA stenosis, 56% RICA by doppler and 12/02/2015, follows with cardiology as outpatient. -Follows with Dr. Claiborne Billings, per last note he does not believe he wants surgery. -Restart aspirin when clinically appropriate.  Chronic renal failure, stage 3 -Currently around his baseline creatinine is 1.52.  Restless leg syndrome -Patient is on Requip, restarted, likely this is being  exacerbated by iron deficiency. -Nulecit iron infusion to be given  Fever on the morning of 3/6 Patient noted to have a temperature 102 on 3/6. Afebrile since then. Source of this fever is not known. WBC is normal. We'll continue to monitor for now.  DVT Prophylaxis: SCDs    Code Status: Full code  Family Communication: Discussed with patient and his wife  Disposition Plan: Management as outlined above.    LOS: 4 days   [facility] Pager [phone] 05/20/2016, 2:32 PM  If 7PM-7AM, please contact night-coverage at [email or web address], password [facility]

## 2016-05-20 NOTE — Progress Notes (Signed)
GI UPDATE:  Patient underwent push enteroscopy this afternoon. He tolerated the procedure well. The exam was normal. The jejunum was reached but no pathology found. The lesion noted on capsule study must have been further distal in the small bowel.   I discussed his case with Dr. Maryland Pink in regards to anticoagulation options. He has had no further bleeding / stable Hgb since being off Xarelto. Given his prior cardiovascular events, it is thought he is higher than average risk for a recurrent event off anticoagulation. In this light, we discussed placing him on heparin and monitor for 24 hours for re-bleeding, and if he does well we can put him on Eliquis which has perhaps less GI bleeding risk and arrange for outpatient balloon enteroscopy at tertiary care center to evaluate the small bowel. If he has worsening anemia / bleeding while back on anticoagulation he may need this procedure done as inpatient. The other option would be to hold off on more aggressive anticoagulation and place him on an aspirin given risks of bleeding, however family and Dr. Maryland Pink feel patient is too high risk for cardiovascular event on aspirin monotherapy, and prefer to place back on anticoagulation. Please order IV Iron infusion to help with anemia while inpatient. We will continue to monitor, please call with questions.   Ringling Cellar, MD [facility] Pager [phone]

## 2016-05-20 NOTE — Progress Notes (Signed)
ANTICOAGULATION CONSULT NOTE - Follow Up Consult  Pharmacy Consult for Heparin Indication: atrial fibrillation  Allergies  Allergen Reactions  . Percocet [Oxycodone-Acetaminophen] Itching and Nausea Only    Patient Measurements: Height: 5\' 6"  (167.6 cm) Weight: 179 lb 3.7 oz (81.3 kg) IBW/kg (Calculated) : 63.8 Heparin Dosing Weight: 81.3 kg  Vital Signs: Temp: 98.1 F (36.7 C) (03/07 1305) Temp Source: Oral (03/07 1305) BP: 109/36 (03/07 1515) Pulse Rate: 66 (03/07 1515)  Labs:  Recent Labs  05/18/16 0326 05/18/16 1031 05/19/16 0222 05/20/16 1054  HGB 6.8* 8.0* 9.0* 9.0*  HCT 21.0* 25.0* 27.8* 27.7*  PLT 190 197 192 202  CREATININE 1.51*  --  1.56* 1.45*    Estimated Creatinine Clearance: 39.3 mL/min (by C-G formula based on SCr of 1.45 mg/dL (H)).  Assessment:  83 yr old male admitted 05/16/16 with symptomatic anemia, melena, heme positive stools.  Was on Xarelto 15 mg daily prior to admission for atrial fibrillation, prior stroke and PVD. Also with history of renal infart and splenic infarct.   Xarelto stopped on admission, last dose 05/14/16.  No further bleeding since off Xarelto.     S/p EGD, colonoscopy, capsule endoscopy, and now s/p enteroscopy today without any source of bleeding identified. Given prior cardiovascular events, it is thought that he is at higher than average risk for recurrent event off anticoagulation. Also at risk of recurrent bleeding with resuming anticoagulation. To resume anticoagulation with IV heparin tonight and monitor for any bleeding. Considering Eliquis later if no bleeding.  Discussed with patient and family. They will report any bleeding.   S/p Protonix infusion 3/2>>3.5, currently on Protonix 40 mg IV q12hrs.  Goal of Therapy:  Heparin level 0.3-0.5 units/ml, lower end of therapeutic range Monitor platelets by anticoagulation protocol: Yes   Plan:   No heparin boluses with recent bleeding.  Begin heparin drip at 950 units/hr  (~12 units/hr/hour)  Heparin level ~ 6 hours after drip begins.  Daily heparin level and CBC while on heparin.  Monitor for any bleeding.  Arty Baumgartner, [place] Pager: [phone] 05/20/2016,8:57 PM

## 2016-05-20 NOTE — Anesthesia Postprocedure Evaluation (Addendum)
Anesthesia Post Note  Patient: Gregory [name]  Procedure(s) Performed: Procedure(s) (LRB): ENTEROSCOPY (N/A)  Patient location during evaluation: Endoscopy Anesthesia Type: MAC Level of consciousness: awake Pain management: pain level controlled Respiratory status: spontaneous breathing Cardiovascular status: stable       Last Vitals:  Vitals:   05/20/16 1445 05/20/16 1454  BP: 129/78 (!) 113/41  Pulse: 77 60  Resp: (!) 25 19  Temp:      Last Pain:  Vitals:   05/20/16 1305  TempSrc: Oral  PainSc:         Case done under moderate sedation.          [place]

## 2016-05-20 NOTE — H&P (View-Only) (Signed)
Progress Note   Subjective  Patient had capsule endoscopy done yesterday, showing a ? subepithelial nodule in the proximal small bowel with red marking and suspected erosion. Otherwise normal study without other pathology noted. CT enterography performed last night.    Objective   Vital signs in last 24 hours: Temp:  [97.4 F (36.3 C)-99.5 F (37.5 C)] 98.6 F (37 C) (03/07 0453) Pulse Rate:  [65-78] 70 (03/07 0453) Resp:  [18-19] 18 (03/07 0453) BP: (104-118)/(39-62) 118/62 (03/07 0453) SpO2:  [93 %-100 %] 100 % (03/07 0453) Weight:  [179 lb 3.7 oz (81.3 kg)] 179 lb 3.7 oz (81.3 kg) (03/07 0453) Last BM Date: 05/19/16 General:    white male in NAD Heart:  Regular rate and rhythm;  Lungs: Respirations even and unlabored,  Abdomen:  Soft, nontender and nondistended.  Extremities:  Without edema. Neurologic:  Alert and oriented,  grossly normal neurologically. Psych:  Cooperative. Normal mood and affect.  Intake/Output from previous day: 03/06 0701 - 03/07 0700 In: 360 [P.O.:360] Out: 503 [Urine:502; Stool:1] Intake/Output this shift: No intake/output data recorded.  Lab Results:  Recent Labs  05/18/16 0326 05/18/16 1031 05/19/16 0222  WBC 7.2 5.8 11.1*  HGB 6.8* 8.0* 9.0*  HCT 21.0* 25.0* 27.8*  PLT 190 197 192   BMET  Recent Labs  05/18/16 0326 05/19/16 0222  NA 139 135  K 3.6 3.6  CL 110 103  CO2 25 24  GLUCOSE 107* 132*  BUN 22* 14  CREATININE 1.51* 1.56*  CALCIUM 8.0* 8.0*   LFT No results for input(s): PROT, ALBUMIN, AST, ALT, ALKPHOS, BILITOT, BILIDIR, IBILI in the last 72 hours. PT/INR No results for input(s): LABPROT, INR in the last 72 hours.  Studies/Results: Ct Entero Abd/pelvis W Contast  Result Date: 05/20/2016 CLINICAL DATA:  Possible submucosal lesion on capsule endoscopy in the small bowel. Severe anemia. EXAM: CT ABDOMEN AND PELVIS WITH CONTRAST (ENTEROGRAPHY) TECHNIQUE: Multidetector CT of the abdomen and pelvis during bolus  administration of intravenous contrast. Negative oral contrast was given. CONTRAST:  100 ISOVUE-300 IOPAMIDOL (ISOVUE-300) INJECTION 61% COMPARISON:  CT abdomen pelvis 08/10/2015. FINDINGS: Lower chest: Image quality is degraded by respiratory motion. There is peribronchovascular ground-glass nodularity and consolidation bilaterally, right greater than left. Heart is enlarged. No pericardial effusion. Small bilateral pleural effusions. Fluid is seen in the distal esophagus. Hepatobiliary: Liver and gallbladder are unremarkable. No biliary ductal dilatation. Pancreas: Negative. Spleen: Negative. Adrenals/Urinary Tract: Adrenal glands are unremarkable. Low-attenuation lesions in the kidneys measure up to 2.0 cm on the right and are likely cysts. Likely renal vascular calcifications on the right. Ureters are decompressed. Bladder is unremarkable. Stomach/Bowel: Stomach is unremarkable. There is poor small bowel distention, limiting evaluation. Endoscopic capsule is seen in the descending colon. Colon is otherwise unremarkable. Appendix is not visualized. Vascular/Lymphatic: Atherosclerotic calcification of the arterial vasculature without abdominal aortic aneurysm. Circumaortic left renal vein. No pathologically enlarged lymph nodes. Reproductive: Prostate is visualized. Other: Small bilateral inguinal hernias contain fat. Mesenteries and peritoneum are unremarkable. Musculoskeletal: No worrisome lytic or sclerotic lesions. Degenerative changes are seen in the spine. IMPRESSION: 1. Poor distention limits evaluation of the small bowel. 2. Bibasilar pulmonary parenchymal peribronchovascular ground-glass nodularity and consolidation, indicative of an infectious bronchiolitis/bronchopneumonia. 3. Small bilateral pleural effusions. 4.  Aortic atherosclerosis (ICD10-170.0). Electronically Signed   By: Lorin Picket M.D.   On: 05/20/2016 07:19       Assessment / Plan:   83 y/o male with multiple medical problems  including CAD and PVD, chronic AF on Xarelto (last dosed 05/15/16), who presented with symptomatic anemia and an episode of hematochezia. EGD and colonoscopy were without any clear etiology. Capsule study performed showing a ? subepithelial nodule in the proximal small bowel with red marking and suspected erosion, without other pathology. I suspect this lesion may be the culprit for his symptoms. Unclear what this lesion represents without further evaluation (GIST, carcinoid, leiomyoma, other are on ddx). CT enterography done which did not distend the small bowel well, but no obvious mass lesions or lymphadenopathy noted which is reassuring. Possible pneumonia incidentally noted.   I discussed options moving forward with patient and wife. I offered him a push enteroscopy today to see if we can reach the area of concern on capsule. Following discussion of risks / benefits he wished to proceed, tentatively scheduled for 2 PM today. If we cannot reach the area of concern on this exam, balloon enteroscopy would be the other way to evaluate this, which is not available at our hospital. He's had a stable Hgb since being off Xarelto, I think he could be discharged in this setting and get this done as an outpatient if needed. We could consider putting him on an aspirin in place of Xarelto which would have less bleeding risk. If it is thought per primary service that he is too high risk for cardiovascular event without full anticoagulation, we will need to discuss timing of resuming this given risk for recurrent bleeding. Further recommendations pending enteroscopy today.   Riverton Cellar, MD [facility] Pager [phone]

## 2016-05-20 NOTE — Op Note (Signed)
[facility] Patient Name: Gregory [name] Procedure Date : 05/20/2016 MRN: [id number] Attending MD: Carlota Raspberry. Armbruster MD, MD Date of Birth: May 10, 1932 CSN: [id number] Age: 83 Admit Type: Inpatient Procedure:                Small bowel enteroscopy Indications:              Abnormal video capsule endoscopy (possible                            subepithelial lesion with red markings, concerning                            for etiology for bleeding) Providers:                Carlota Raspberry. Armbruster MD, MD, Burtis Junes, RN, Corliss Parish, Technician Referring MD:              Medicines:                Fentanyl 50 micrograms IV, Midazolam 5 mg IV Complications:            No immediate complications. Estimated blood loss:                            None. Estimated Blood Loss:     Estimated blood loss: none. Procedure:                Pre-Anesthesia Assessment:                           - Prior to the procedure, a History and Physical                            was performed, and patient medications and                            allergies were reviewed. The patient's tolerance of                            previous anesthesia was also reviewed. The risks                            and benefits of the procedure and the sedation                            options and risks were discussed with the patient.                            All questions were answered, and informed consent                            was obtained. Prior Anticoagulants: The patient has  taken Xarelto (rivaroxaban), last dose was 5 days                            prior to procedure. ASA Grade Assessment: III - A                            patient with severe systemic disease. After                            reviewing the risks and benefits, the patient was                            deemed in satisfactory condition to undergo the   procedure.                           After obtaining informed consent, the endoscope was                            passed under direct vision. Throughout the                            procedure, the patient's blood pressure, pulse, and                            oxygen saturations were monitored continuously. The                            EC-[id number] ([id number]) scope was introduced through                            the mouth and advanced to the proximal jejunum. The                            small bowel enteroscopy was accomplished without                            difficulty. The patient tolerated the procedure                            well. Scope In: Scope Out: Findings:      Esophagogastric landmarks were identified: the Z-line was found at 36       cm, the gastroesophageal junction was found at 36 cm and the upper       extent of the gastric folds was found at 38 cm from the incisors.      A 2 cm hiatal hernia was present.      One mild benign-appearing Shatskin ring was found at the GEJ.      The exam of the esophagus was otherwise normal.      The entire examined stomach was normal.      There was no evidence of significant pathology in the entire examined       duodenum.      There was no evidence of significant pathology in the proximal jejunum. Impression:               -  Esophagogastric landmarks identified.                           - 2 cm hiatal hernia.                           - Mild nonobstructive Shatski ring                           - Normal stomach.                           - Normal examined duodenum.                           - The examined portion of the jejunum was normal.                           Unfortunately push enteroscopy was not able to                            reach extent of where the lesion was noted on                            capsule study Moderate Sedation:      Moderate (conscious) sedation was administered by the endoscopy nurse        and supervised by the endoscopist. The following parameters were       monitored: oxygen saturation, heart rate, blood pressure, and response       to care. Total physician intraservice time was 18 minutes. Recommendation:           - Return patient to hospital ward for ongoing care.                           - Resume regular diet.                           - Continue present medications.                           - Will discuss with primary team decision regarding                            anticoagulation moving forward. The patient has had                            no further bleeding since coming off Xarelto, and                            is at risk for recurrent bleeding with resumption                            of anticoagulation until underlying etiology                            addressed. I think he  is stable for discharge home                            from bleeding perspective, could place on aspirin                           - Patient warrants further evaluation as outpatient                            with possible balloon enteroscopy at tertiary care                            facility to evaluate lesion noted on capsule                            endoscopy Procedure Code(s):        --- Professional ---                           [phone], Small intestinal endoscopy, enteroscopy                            beyond second portion of duodenum, not including                            ileum; diagnostic, including collection of                            specimen(s) by brushing or washing, when performed                            (separate procedure)                           99152, Moderate sedation services provided by the                            same physician or other qualified health care                            professional performing the diagnostic or                            therapeutic service that the sedation supports,                            requiring the  presence of an independent trained                            observer to assist in the monitoring of the                            patient's level of consciousness and physiological  status; initial 15 minutes of intraservice time,                            patient age 24 years or older Diagnosis Code(s):        --- Professional ---                           K44.9, Diaphragmatic hernia without obstruction or                            gangrene                           K22.2, Esophageal obstruction                           R93.3, Abnormal findings on diagnostic imaging of                            other parts of digestive tract CPT copyright 2016 [employer]. All rights reserved. The codes documented in this report are preliminary and upon coder review may  be revised to meet current compliance requirements. Remo Lipps P. Armbruster MD, MD 05/20/2016 3:01:37 PM This report has been signed electronically. Number of Addenda: 0

## 2016-05-21 ENCOUNTER — Encounter (HOSPITAL_COMMUNITY): Payer: Self-pay | Admitting: Gastroenterology

## 2016-05-21 ENCOUNTER — Ambulatory Visit: Payer: Medicare Other | Admitting: Family Medicine

## 2016-05-21 DIAGNOSIS — Z7901 Long term (current) use of anticoagulants: Secondary | ICD-10-CM

## 2016-05-21 LAB — CBC
HEMATOCRIT: 25.1 % — AB (ref 39.0–52.0)
HEMOGLOBIN: 8.1 g/dL — AB (ref 13.0–17.0)
MCH: 28 pg (ref 26.0–34.0)
MCHC: 32.3 g/dL (ref 30.0–36.0)
MCV: 86.9 fL (ref 78.0–100.0)
Platelets: 198 10*3/uL (ref 150–400)
RBC: 2.89 MIL/uL — AB (ref 4.22–5.81)
RDW: 16.5 % — ABNORMAL HIGH (ref 11.5–15.5)
WBC: 6.6 10*3/uL (ref 4.0–10.5)

## 2016-05-21 LAB — BASIC METABOLIC PANEL
Anion gap: 6 (ref 5–15)
BUN: 10 mg/dL (ref 6–20)
CHLORIDE: 102 mmol/L (ref 101–111)
CO2: 28 mmol/L (ref 22–32)
Calcium: 8 mg/dL — ABNORMAL LOW (ref 8.9–10.3)
Creatinine, Ser: 1.49 mg/dL — ABNORMAL HIGH (ref 0.61–1.24)
GFR calc Af Amer: 49 mL/min — ABNORMAL LOW (ref >60)
GFR calc non Af Amer: 42 mL/min — ABNORMAL LOW (ref >60)
Glucose, Bld: 117 mg/dL — ABNORMAL HIGH (ref 65–99)
Potassium: 3.8 mmol/L (ref 3.5–5.1)
SODIUM: 136 mmol/L (ref 135–145)

## 2016-05-21 LAB — HEPARIN LEVEL (UNFRACTIONATED)
Heparin Unfractionated: 0.36 IU/mL (ref 0.30–0.70)
Heparin Unfractionated: 0.29 IU/mL — ABNORMAL LOW (ref 0.30–0.70)

## 2016-05-21 MED ORDER — PANTOPRAZOLE SODIUM 40 MG PO TBEC
40.0000 mg | DELAYED_RELEASE_TABLET | Freq: Every day | ORAL | Status: DC
  Administered 2016-05-21 – 2016-05-22 (×2): 40 mg via ORAL
  Filled 2016-05-21 (×2): qty 1

## 2016-05-21 MED ORDER — APIXABAN 2.5 MG PO TABS
2.5000 mg | ORAL_TABLET | Freq: Two times a day (BID) | ORAL | Status: DC
  Administered 2016-05-21 – 2016-05-22 (×2): 2.5 mg via ORAL
  Filled 2016-05-21 (×3): qty 1

## 2016-05-21 NOTE — Care Management Important Message (Signed)
Important Message  Patient Details  Name: Gregory [name] MRN: [id number] Date of Birth: Mar 18, 1932   Medicare Important Message Given:  Yes    Giavanni Zeitlin Montine Circle 05/21/2016, 8:42 AM

## 2016-05-21 NOTE — Progress Notes (Signed)
Daily Rounding Note  05/21/2016, 8:15 AM  LOS: 5 days   SUBJECTIVE:     No complaints.  Denies SOB, chest pain, palpitations, weakness, dizziness.  Last BM was on 3/6.    OBJECTIVE:         Vital signs in last 24 hours:    Temp:  [98.1 F (36.7 C)-98.8 F (37.1 C)] 98.2 F (36.8 C) (03/08 0605) Pulse Rate:  [53-77] 59 (03/08 0605) Resp:  [18-25] 18 (03/08 0605) BP: (109-176)/(31-78) 128/53 (03/08 0605) SpO2:  [92 %-100 %] 99 % (03/08 0605) Last BM Date: 05/19/16 Filed Weights   05/18/16 1326 05/19/16 0435 05/20/16 0453  Weight: 88.5 kg (195 lb) 87.1 kg (192 lb 0.3 oz) 81.3 kg (179 lb 3.7 oz)   General: pleasant, NAD   Heart: RRR Chest: dry rales in left > right base.   Abdomen: soft, NT, ND.  Active BS.  No mass  Extremities: slight, non-pitting pedal/ankle edema Neuro/Psych:  Oriented x 3.  Fully alert.  No tremor or gross weakness.   Intake/Output from previous day: 03/07 0701 - 03/08 0700 In: 790.3 [P.O.:720; I.V.:70.3] Out: -   Intake/Output this shift: No intake/output data recorded.  Lab Results:  Recent Labs  05/19/16 0222 05/20/16 1054 05/21/16 0355  WBC 11.1* 8.6 6.6  HGB 9.0* 9.0* 8.1*  HCT 27.8* 27.7* 25.1*  PLT 192 202 198   BMET  Recent Labs  05/19/16 0222 05/20/16 1054 05/21/16 0355  NA 135 135 136  K 3.6 3.8 3.8  CL 103 99* 102  CO2 24 28 28   GLUCOSE 132* 101* 117*  BUN 14 11 10   CREATININE 1.56* 1.45* 1.49*  CALCIUM 8.0* 8.2* 8.0*   LFT No results for input(s): PROT, ALBUMIN, AST, ALT, ALKPHOS, BILITOT, BILIDIR, IBILI in the last 72 hours. PT/INR No results for input(s): LABPROT, INR in the last 72 hours. Hepatitis Panel No results for input(s): HEPBSAG, HCVAB, HEPAIGM, HEPBIGM in the last 72 hours.  Studies/Results: Ct Entero Abd/pelvis W Contast  Result Date: 05/20/2016 CLINICAL DATA:  Possible submucosal lesion on capsule endoscopy in the small bowel. Severe  anemia. EXAM: CT ABDOMEN AND PELVIS WITH CONTRAST (ENTEROGRAPHY) TECHNIQUE: Multidetector CT of the abdomen and pelvis during bolus administration of intravenous contrast. Negative oral contrast was given. CONTRAST:  100 ISOVUE-300 IOPAMIDOL (ISOVUE-300) INJECTION 61% COMPARISON:  CT abdomen pelvis 08/10/2015. FINDINGS: Lower chest: Image quality is degraded by respiratory motion. There is peribronchovascular ground-glass nodularity and consolidation bilaterally, right greater than left. Heart is enlarged. No pericardial effusion. Small bilateral pleural effusions. Fluid is seen in the distal esophagus. Hepatobiliary: Liver and gallbladder are unremarkable. No biliary ductal dilatation. Pancreas: Negative. Spleen: Negative. Adrenals/Urinary Tract: Adrenal glands are unremarkable. Low-attenuation lesions in the kidneys measure up to 2.0 cm on the right and are likely cysts. Likely renal vascular calcifications on the right. Ureters are decompressed. Bladder is unremarkable. Stomach/Bowel: Stomach is unremarkable. There is poor small bowel distention, limiting evaluation. Endoscopic capsule is seen in the descending colon. Colon is otherwise unremarkable. Appendix is not visualized. Vascular/Lymphatic: Atherosclerotic calcification of the arterial vasculature without abdominal aortic aneurysm. Circumaortic left renal vein. No pathologically enlarged lymph nodes. Reproductive: Prostate is visualized. Other: Small bilateral inguinal hernias contain fat. Mesenteries and peritoneum are unremarkable. Musculoskeletal: No worrisome lytic or sclerotic lesions. Degenerative changes are seen in the spine. IMPRESSION: 1. Poor distention limits evaluation of the small bowel. 2. Bibasilar pulmonary parenchymal peribronchovascular ground-glass nodularity and consolidation, indicative  of an infectious bronchiolitis/bronchopneumonia. 3. Small bilateral pleural effusions. 4.  Aortic atherosclerosis (ICD10-170.0). Electronically Signed    By: Lorin Picket M.D.   On: 05/20/2016 07:19   Scheduled Meds: . carvedilol  3.125 mg Oral BID WC  . lisinopril  5 mg Oral Daily  . pantoprazole (PROTONIX) IV  40 mg Intravenous Q12H  . rOPINIRole  0.25 mg Oral BID  . traZODone  25 mg Oral QHS   Continuous Infusions: . heparin 1,150 Units/hr (05/21/16 0526)   PRN Meds:.acetaminophen, HYDROcodone-acetaminophen, LORazepam, ondansetron **OR** ondansetron (ZOFRAN) IV, sodium chloride flush   ASSESMENT:   *  IDA, transfusions x 4, Feraheme x 1.  FOBT + in pt on zaroxolyn for afib, CVA, CAD.    3/4 EGD: Normal.    3/5 Colonoscopy to ileum: Left colon tics.  Removal 6 mm recto-sigmoid polyp.  Int rrhoids.   3/6 Capsule endo: non-bleedingsubmucosal lesion with erosion at 3 m, prominent venous stuctures at 76 m.   3/7 Eteroscopy: HH.  Schatski ring.    PLAN   *  Outpt referral to tertiary care endoscopist for spiral or baloon entersoscopy.  Dr Carlean Purl is his primary GI MD.    *  IV Heparin resumed.  Resume zaroxolyn if no bleeding by 2200 today (after 24 hours Heparin).   *  Stop IV Protonix, resume outpt oral PPI for hx GERD.    *  Daily CBC.       Azucena Freed  05/21/2016, 8:15 AM Pager: [phone]

## 2016-05-21 NOTE — Progress Notes (Signed)
[facility] PROGRESS NOTE  Gregory [name] ZOX:[id number] DOB: [birth year] DOA: 05/15/2016  PCP: Lamar Blinks, MD  Brief History/Interval Summary: 83 year old Caucasian male with a past medical history of coronary artery disease, peripheral vascular disease, chronic atrial fibrillation on Xarelto, who presented with one-week history of progressively worsening shortness of breath, dizziness and weakness. Patient went to his primary care provider and was found to have low hemoglobin. He was hospitalized for further management.  Reason for Visit: GI bleed  Consultants: Gastroenterology  Procedures:  Colonoscopy Impression:                - Preparation of the colon was fair leading to                            several minutes of lavage with clearing, adequate                            views obtained.                           - The examined portion of the ileum was normal.                           - Diverticulosis in the left colon.                           - One 6 mm polyp at the recto-sigmoid colon,                            removed with a hot snare. Resected and retrieved.                           - Internal hemorrhoids, moderate in size.                           - The examination was otherwise normal.                           Brown stool in the colon, no obvious evidence of                            bleeding  EGD Impression:                - Normal appearing, widely patent esophagus and GEJ.                           - Normal stomach.                           - Normal examined duodenum.                           - No fresh or old heme in the upper GI tract.                           - No specimens collected.  Small bowel enteroscopy Impression:                -  Esophagogastric landmarks identified.                           - 2 cm hiatal hernia.                           - Mild nonobstructive Shatski ring                           - Normal stomach.                    - Normal examined duodenum.                           - The examined portion of the jejunum was normal.                           Unfortunately push enteroscopy was not able to                            reach extent of where the lesion was noted on                            capsule study  Antibiotics: None  Subjective/Interval History: Patient feels well. Denies any abdominal pain, nausea or vomiting. Has not had any bowel movements. Denies any bleeding.   ROS: Denies any shortness of breath or chest pain.  Objective:  Vital Signs  Vitals:   05/20/16 1505 05/20/16 1515 05/20/16 2116 05/21/16 0605  BP: (!) 124/38 (!) 109/36 125/71 (!) 128/53  Pulse: 67 66 66 (!) 59  Resp: 19 19 18 18   Temp:   98.8 F (37.1 C) 98.2 F (36.8 C)  TempSrc:   Oral Oral  SpO2: 94% 92% 96% 99%  Weight:      Height:        Intake/Output Summary (Last 24 hours) at 05/21/16 0930 Last data filed at 05/21/16 0526  Gross per 24 hour  Intake            790.3 ml  Output                0 ml  Net            790.3 ml   Filed Weights   05/18/16 1326 05/19/16 0435 05/20/16 0453  Weight: 88.5 kg (195 lb) 87.1 kg (192 lb 0.3 oz) 81.3 kg (179 lb 3.7 oz)    General appearance: alert, cooperative, appears stated age and no distress Resp: clear to auscultation bilaterally Cardio: regular rate and rhythm, S1, S2 normal, no murmur, click, rub or gallop GI: soft, non-tender; bowel sounds normal; no masses,  no organomegaly Neurologic: Awake and alert. Oriented 3. No focal neurological deficits.  Lab Results:  Data Reviewed: I have personally reviewed following labs and imaging studies  CBC:  Recent Labs Lab 05/15/16 1631 05/15/16 2324  05/18/16 0326 05/18/16 1031 05/19/16 0222 05/20/16 1054 05/21/16 0355  WBC 6.9 5.6  < > 7.2 5.8 11.1* 8.6 6.6  NEUTROABS 3,381 2.5  --   --   --   --   --   --   HGB 6.7* 6.3*  < > 6.8* 8.0* 9.0* 9.0* 8.1*  HCT 20.9*  19.9*  < > 21.0* 25.0*  27.8* 27.7* 25.1*  MCV 86.0 88.4  < > 86.4 86.5 85.3 86.0 86.9  PLT 242 237  < > 190 197 192 202 198  < > = values in this interval not displayed.  Basic Metabolic Panel:  Recent Labs Lab 05/17/16 0402 05/18/16 0326 05/19/16 0222 05/20/16 1054 05/21/16 0355  NA 139 139 135 135 136  K 3.9 3.6 3.6 3.8 3.8  CL 111 110 103 99* 102  CO2 23 25 24 28 28   GLUCOSE 103* 107* 132* 101* 117*  BUN 23* 22* 14 11 10   CREATININE 1.52* 1.51* 1.56* 1.45* 1.49*  CALCIUM 8.5* 8.0* 8.0* 8.2* 8.0*    GFR: Estimated Creatinine Clearance: 38.3 mL/min (by C-G formula based on SCr of 1.49 mg/dL (H)).  Liver Function Tests:  Recent Labs Lab 05/15/16 1631 05/15/16 2324  AST 14 17  ALT 8* 10*  ALKPHOS 63 59  BILITOT 0.5 0.4  PROT 6.2 6.1*  ALBUMIN 3.3* 2.9*    CBG:  Recent Labs Lab 05/16/16 1721  GLUCAP 90     Recent Results (from the past 240 hour(s))  Culture, blood (routine x 2)     Status: None (Preliminary result)   Collection Time: 05/19/16  2:28 AM  Result Value Ref Range Status   Specimen Description BLOOD LEFT ANTECUBITAL  Final   Special Requests IN PEDIATRIC BOTTLE 3CC  Final   Culture NO GROWTH 1 DAY  Final   Report Status PENDING  Incomplete  Culture, blood (routine x 2)     Status: None (Preliminary result)   Collection Time: 05/19/16  2:31 AM  Result Value Ref Range Status   Specimen Description BLOOD LEFT HAND  Final   Special Requests IN PEDIATRIC BOTTLE 3CC  Final   Culture NO GROWTH 1 DAY  Final   Report Status PENDING  Incomplete      Radiology Studies: Ct Entero Abd/pelvis W Contast  Result Date: 05/20/2016 CLINICAL DATA:  Possible submucosal lesion on capsule endoscopy in the small bowel. Severe anemia. EXAM: CT ABDOMEN AND PELVIS WITH CONTRAST (ENTEROGRAPHY) TECHNIQUE: Multidetector CT of the abdomen and pelvis during bolus administration of intravenous contrast. Negative oral contrast was given. CONTRAST:  100 ISOVUE-300 IOPAMIDOL (ISOVUE-300)  INJECTION 61% COMPARISON:  CT abdomen pelvis 08/10/2015. FINDINGS: Lower chest: Image quality is degraded by respiratory motion. There is peribronchovascular ground-glass nodularity and consolidation bilaterally, right greater than left. Heart is enlarged. No pericardial effusion. Small bilateral pleural effusions. Fluid is seen in the distal esophagus. Hepatobiliary: Liver and gallbladder are unremarkable. No biliary ductal dilatation. Pancreas: Negative. Spleen: Negative. Adrenals/Urinary Tract: Adrenal glands are unremarkable. Low-attenuation lesions in the kidneys measure up to 2.0 cm on the right and are likely cysts. Likely renal vascular calcifications on the right. Ureters are decompressed. Bladder is unremarkable. Stomach/Bowel: Stomach is unremarkable. There is poor small bowel distention, limiting evaluation. Endoscopic capsule is seen in the descending colon. Colon is otherwise unremarkable. Appendix is not visualized. Vascular/Lymphatic: Atherosclerotic calcification of the arterial vasculature without abdominal aortic aneurysm. Circumaortic left renal vein. No pathologically enlarged lymph nodes. Reproductive: Prostate is visualized. Other: Small bilateral inguinal hernias contain fat. Mesenteries and peritoneum are unremarkable. Musculoskeletal: No worrisome lytic or sclerotic lesions. Degenerative changes are seen in the spine. IMPRESSION: 1. Poor distention limits evaluation of the small bowel. 2. Bibasilar pulmonary parenchymal peribronchovascular ground-glass nodularity and consolidation, indicative of an infectious bronchiolitis/bronchopneumonia. 3. Small bilateral pleural effusions. 4.  Aortic atherosclerosis (ICD10-170.0). Electronically Signed  By: Lorin Picket M.D.   On: 05/20/2016 07:19     Medications:  Scheduled: . carvedilol  3.125 mg Oral BID WC  . lisinopril  5 mg Oral Daily  . pantoprazole  40 mg Oral Q0600  . rOPINIRole  0.25 mg Oral BID  . traZODone  25 mg Oral QHS    Continuous: . heparin 1,150 Units/hr (05/21/16 0526)   KGY:JEHUDJSHFWYOV, HYDROcodone-acetaminophen, LORazepam, ondansetron **OR** ondansetron (ZOFRAN) IV, sodium chloride flush  Assessment/Plan:  Principal Problem:   GIB (gastrointestinal bleeding) Active Problems:   CAD (coronary artery disease),CABG 1993-LIMA to the LAD, SVG to Om and SVG to PDA/PLA, patent on cath 2014.   Chronic a-fib (HCC)   PVD - 78% LICA, 58% RICA by doppler 5/14   Anticoagulated on Xarelto   Coronary artery disease involving native coronary artery   Chronic renal failure, stage 3 (moderate)   Abnormal finding on GI tract imaging   Acute blood loss anemia    GI bleed -Baseline hemoglobin around 11 from 5 months ago. -Has anemia of hemoglobin of 6.3 with low iron and positive FOBT, likely from GI bleed. -Gastroenterology was consulted. Patient is status post EGD, colonoscopy and small bowel enteroscopy. He also underwent CT enterography. Capsule study did show an area of concern in the proximal small bowel. Unfortunately, the small bowel enteroscopy did not reach this area. -Continue PPI  -Patient was started on IV heparin yesterday and will be monitored on this for 24 hours. If he does not have any further episodes of bleeding, he will be started on eliquis as this has slightly low risk for bleeding as compared to Xarelto. Aspirin also on hold currently. - If patient bleeds, then he will need to undergo balloon enteroscopy as inpatient and, for which he will need to be transferred to another facility. If on the other hand, he does not bleed, then this can be arranged as an outpatient.  Anemia, acute blood loss -Anemia of acute blood loss secondary to GI bleed, hemoglobin nadir was 6.3. -Status post transfusion of total 4 units of packed RBCs so far. Hemoglobin noted to be slightly lower this morning. Will be repeated this afternoon. Patient was also given IV iron.  CAD -CABG 1993-LIMA to the LAD, SVG  to Om and SVG to PDA/PLA, patent on cath 2014. -Holding all blood thinners, on aspirin.  Chronic a-fib -Rate is controlled with beta blockers. History of renal infarct. -CHA2DS2-VASc is >3, was on Xarelto, hold because of concurrent bleed. Would hesitate holding anticoagulation for prolonged period of time due to his history of renal infarct and splenic infarcts when he was taken off of anticoagulation, even for short duration of time. - Currently on IV heparin. Plan is to initiate eliquis later today if no evidence of bleeding.  PVD -85-02% LICA stenosis, 77% RICA by doppler and 12/02/2015, follows with cardiology as outpatient. -Follows with Dr. Claiborne Billings, per last note he does not want surgery. -Restart aspirin when clinically appropriate.  Chronic renal failure, stage 3 -Currently around his baseline.  Restless leg syndrome -Patient is on Requip, restarted, likely this is being exacerbated by iron deficiency. Status post IV iron infusion.  Fever on the morning of 3/6 Patient noted to have a temperature 102 on 3/6. Afebrile since then. Source of this fever is not known. WBC is normal. We'll continue to monitor for now.  DVT Prophylaxis: SCDs    Code Status: Full code  Family Communication: Discussed with patient and his wife  Disposition Plan:  Management as outlined above. Okay to mobilize. Possible discharge tomorrow if no evidence for bleeding.    LOS: 5 days   [facility] Pager [phone] 05/21/2016, 9:30 AM  If 7PM-7AM, please contact night-coverage at [email or web address], password [facility]

## 2016-05-21 NOTE — Progress Notes (Signed)
ANTICOAGULATION CONSULT NOTE - Follow Up Consult  Pharmacy Consult for Heparin -> Eliquis Indication: atrial fibrillation  Allergies  Allergen Reactions  . Percocet [Oxycodone-Acetaminophen] Itching and Nausea Only    Patient Measurements: Height: 5\' 6"  (167.6 cm) Weight: 179 lb 3.7 oz (81.3 kg) IBW/kg (Calculated) : 63.8 Heparin Dosing Weight: 81.3 kg  Vital Signs: Temp: 97.6 F (36.4 C) (03/08 1311) Temp Source: Oral (03/08 1311) BP: 145/58 (03/08 1311) Pulse Rate: 61 (03/08 1311)  Labs:  Recent Labs  05/19/16 0222 05/20/16 1054 05/21/16 0355 05/21/16 1255 05/21/16 1915  HGB 9.0* 9.0* 8.1*  --   --   HCT 27.8* 27.7* 25.1*  --   --   PLT 192 202 198  --   --   HEPARINUNFRC  --   --  <0.10* 0.36 0.29*  CREATININE 1.56* 1.45* 1.49*  --   --     Estimated Creatinine Clearance: 38.3 mL/min (by C-G formula based on SCr of 1.49 mg/dL (H)).  Assessment:  83 yr old male admitted 05/16/16 with symptomatic anemia, melena, heme positive stools.  Was on Xarelto 15 mg daily prior to admission for atrial fibrillation, prior stroke and PVD. Also with history of renal infart and splenic infarct.   Xarelto stopped on admission, last dose 05/14/16.  No further bleeding.     S/p EGD, colonoscopy, capsule endoscopy, and now s/p enteroscopy today without any source of bleeding identified. Given prior cardiovascular events, it is thought that he is at higher than average risk for recurrent event off anticoagulation. Also at risk of recurrent bleeding with resuming anticoagulation.     Anticoagulation with IV heparin begun on ~10pm on 3/7. Heparin level tonight is 0.29 on 1150 units/hr. No bleeding reported.  To begin Eliquis tonight, about 24 hrs after Heparin begun.   83 yrs old, weight > 60 kg, Scr 1.49 today.  Break point is Scr >1.5 to decrease Eliquis from 5 mg to 2.5 mg BID. SCr was 1.9 on admit, ~1.5 may be his baseline.  Goal of Therapy:  Heparin level 0.3-0.5, lower end of  therapeutic range Appropriate Eliquis dose for full anticoagulation Monitor platelets by anticoagulation protocol: Yes   Plan:   Eliquis 2.5 mg BID.  IV heparin stopped at the time first Eliquis dose given, ~9:15pm.  Discussed with patient and family in the room.  Monitor for any bleeding.  Arty Baumgartner, [place] Pager: [phone] 05/21/2016,9:32 PM

## 2016-05-21 NOTE — Progress Notes (Signed)
ANTICOAGULATION CONSULT NOTE - Follow Up Consult  Pharmacy Consult for Heparin Indication: atrial fibrillation  Allergies  Allergen Reactions  . Percocet [Oxycodone-Acetaminophen] Itching and Nausea Only    Patient Measurements: Height: 5\' 6"  (167.6 cm) Weight: 179 lb 3.7 oz (81.3 kg) IBW/kg (Calculated) : 63.8  Vital Signs: Temp: 97.6 F (36.4 C) (03/08 1311) Temp Source: Oral (03/08 1311) BP: 145/58 (03/08 1311) Pulse Rate: 61 (03/08 1311)  Labs:  Recent Labs  05/19/16 0222 05/20/16 1054 05/21/16 0355 05/21/16 1255  HGB 9.0* 9.0* 8.1*  --   HCT 27.8* 27.7* 25.1*  --   PLT 192 202 198  --   HEPARINUNFRC  --   --  <0.10* 0.36  CREATININE 1.56* 1.45* 1.49*  --     Estimated Creatinine Clearance: 38.3 mL/min (by C-G formula based on SCr of 1.49 mg/dL (H)).   Medications:  Scheduled:  . carvedilol  3.125 mg Oral BID WC  . lisinopril  5 mg Oral Daily  . pantoprazole  40 mg Oral Q0600  . rOPINIRole  0.25 mg Oral BID  . traZODone  25 mg Oral QHS    Assessment: 83 yr old male admitted 05/16/16 with symptomatic anemia, melena, heme positive stools.  Was on Xarelto 15 mg daily prior to admission for atrial fibrillation, prior stroke and PVD. Also with history of renal infart and splenic infarct. Xarelto stopped on admission, last dose 05/14/16.  No further bleeding since off Xarelto.  S/p EGD, colonoscopy, capsule endoscopy, enteroscopy without any source of bleeding identified. Heparin resumed 3/7 with consideration of Eliquis later if no bleeding.  Heparin adjusted overnight for undetectable level with repeat level within goal range.  CBC stable, no bleeding per d/w RN.  Goal of Therapy:  Heparin level 0.3-0.5 units/ml Monitor platelets by anticoagulation protocol: Yes   Plan:  Continue heparin 1150 units/hr Repeat HL 6hr, note goal Daily CBC, HL F/U plan to resume Apix after 24hr of heparin  Gracy Bruins, [facility]

## 2016-05-21 NOTE — Progress Notes (Signed)
ANTICOAGULATION CONSULT NOTE - Follow Up Consult  Pharmacy Consult for Heparin Indication: atrial fibrillation  Allergies  Allergen Reactions  . Percocet [Oxycodone-Acetaminophen] Itching and Nausea Only    Patient Measurements: Height: 5\' 6"  (167.6 cm) Weight: 179 lb 3.7 oz (81.3 kg) IBW/kg (Calculated) : 63.8 Heparin Dosing Weight: 81.3 kg  Vital Signs: Temp: 98.8 F (37.1 C) (03/07 2116) Temp Source: Oral (03/07 2116) BP: 125/71 (03/07 2116) Pulse Rate: 66 (03/07 2116)  Labs:  Recent Labs  05/19/16 0222 05/20/16 1054 05/21/16 0355  HGB 9.0* 9.0* 8.1*  HCT 27.8* 27.7* 25.1*  PLT 192 202 198  HEPARINUNFRC  --   --  <0.10*  CREATININE 1.56* 1.45* 1.49*    Estimated Creatinine Clearance: 38.3 mL/min (by C-G formula based on SCr of 1.49 mg/dL (H)).  Assessment: 83 yr old male admitted 05/16/16 with symptomatic anemia, melena, heme positive stools.  Was on Xarelto 15 mg daily prior to admission for atrial fibrillation, prior stroke and PVD. Also with history of renal infart and splenic infarct. Xarelto stopped on admission, last dose 05/14/16.  No further bleeding since off Xarelto.  S/p EGD, colonoscopy, capsule endoscopy, enteroscopy without any source of bleeding identified. Heparin resumed 3/7 with consideration of Eliquis later if no bleeding. Heparin level undetectable on gtt at 950 units/hr. No issues with line or bleeding reported per RN. Hgb low but stable.  Goal of Therapy:  Heparin level 0.3-0.5 units/ml, lower end of therapeutic range Monitor platelets by anticoagulation protocol: Yes   Plan:  No heparin boluses with recent bleeding Increase heparin drip to 1150 units/hr Heparin level in 8 hours Monitor for any bleeding  Sherlon Handing, PharmD, BCPS Clinical pharmacist, pager [phone] 05/21/2016,5:19 AM

## 2016-05-21 NOTE — Care Management Important Message (Signed)
Important Message  Patient Details  Name: Gregory [name] MRN: [id number] Date of Birth: May 07, 1932   Medicare Important Message Given:  Other (see comment) Unsigned copy left in the patient room    Cerra Eisenhower 05/21/2016, 11:19 AM

## 2016-05-21 NOTE — Care Management Note (Signed)
Case Management Note  Patient Details  Name: Gregory [name] MRN: [id number] Date of Birth: Oct 28, 1932  Subjective/Objective:                    Action/Plan:  30 day free card for Eliquis given and explained to patient. Patient voiced understanding  Expected Discharge Date:                  Expected Discharge Plan:  Home/Self Care  In-House Referral:     Discharge planning Services     Post Acute Care Choice:    Choice offered to:  Patient  DME Arranged:    DME Agency:     HH Arranged:    [place] Agency:     Status of Service:  Completed, signed off  If discussed at [employer] of Stay Meetings, dates discussed:    Additional Comments:  Marilu Favre, RN 05/21/2016, 3:28 PM

## 2016-05-22 ENCOUNTER — Telehealth: Payer: Self-pay

## 2016-05-22 DIAGNOSIS — K922 Gastrointestinal hemorrhage, unspecified: Secondary | ICD-10-CM

## 2016-05-22 LAB — HEMOGLOBIN AND HEMATOCRIT, BLOOD
HEMATOCRIT: 27.1 % — AB (ref 39.0–52.0)
Hemoglobin: 8.7 g/dL — ABNORMAL LOW (ref 13.0–17.0)

## 2016-05-22 MED ORDER — APIXABAN 2.5 MG PO TABS: 2.5000 mg | ORAL_TABLET | Freq: Two times a day (BID) | ORAL | 1 refills | Status: DC

## 2016-05-22 NOTE — Telephone Encounter (Signed)
See Gregory [name]'s question and let me know if you want o refer now or later

## 2016-05-22 NOTE — Progress Notes (Signed)
Anxious to go home. Denies abd pain, not bloody stools today. Discharge instructions given to pt and wife, verbalized understanding. Discharged to home accompanied by family.

## 2016-05-22 NOTE — Discharge Summary (Signed)
[facility]  Physician Discharge Summary   Patient ID: Gregory [name] MRN: [id number] DOB/AGE: January 26, 1933 83 y.o.  Admit date: 05/15/2016 Discharge date: 05/22/2016  PCP: Lamar Blinks, MD  DISCHARGE DIAGNOSES:  Principal Problem:   GIB (gastrointestinal bleeding) Active Problems:   CAD (coronary artery disease),CABG 1993-LIMA to the LAD, SVG to Om and SVG to PDA/PLA, patent on cath 2014.   Chronic a-fib (HCC)   PVD - 65% LICA, 46% RICA by doppler 5/14   Anticoagulated on Xarelto   Coronary artery disease involving native coronary artery   Chronic renal failure, stage 3 (moderate)   Abnormal finding on GI tract imaging   Acute blood loss anemia   RECOMMENDATIONS FOR OUTPATIENT FOLLOW UP: 1. CBC next week, to be arranged by gastroenterology. 2. Outpatient referred to a tertiary center for balloon enteroscopy to be arranged by gastroenterology  3. Patient has been changed over to Eliquis from [place]. Please see below   DISCHARGE CONDITION: fair  Diet recommendation: Heart healthy  Filed Weights   05/18/16 1326 05/19/16 0435 05/20/16 0453  Weight: 88.5 kg (195 lb) 87.1 kg (192 lb 0.3 oz) 81.3 kg (179 lb 3.7 oz)    INITIAL HISTORY: 83 year old Caucasian male with a past medical history of coronary artery disease, peripheral vascular disease, chronic atrial fibrillation on Xarelto, who presented with one-week history of progressively worsening shortness of breath, dizziness and weakness. Patient went to his primary care provider and was found to have low hemoglobin. He was hospitalized for further management.  Consultations:  Gastroenterology  Procedures:  Colonoscopy Impression:  - Preparation of the colon was fair leading to  several minutes of lavage with clearing, adequate  views obtained. - The examined portion of the ileum was  normal. - Diverticulosis in the left colon. - One 6 mm polyp at the recto-sigmoid colon,  removed with a hot snare. Resected and retrieved. - Internal hemorrhoids, moderate in size. - The examination was otherwise normal. Brown stool in the colon, no obvious evidence of  bleeding  EGD Impression:  - Normal appearing, widely patent esophagus and GEJ. - Normal stomach. - Normal examined duodenum. - No fresh or old heme in the upper GI tract. - No specimens collected.  Capsule study non-bleedingsubmucosal lesion with erosion at 80 m, prominent venous stuctures at 52 m  Small bowel enteroscopy Impression:  - Esophagogastric landmarks identified. - 2 cm hiatal hernia. - Mild nonobstructive Shatski ring - Normal stomach. - Normal examined duodenum. - The examined portion of the jejunum was normal. Unfortunately push enteroscopy was not able to  reach extent of where the lesion was noted on  capsule study   HOSPITAL COURSE:   GI bleed Patient's baseline hemoglobin around 11 from 5 months ago. Presented with anemia with a hemoglobin of 6.3. Found to have low iron. He was heme positive in stool. Gastroenterology was consulted. Patient is status post EGD, colonoscopy and small bowel enteroscopy. He also underwent CT enterography. Capsule study did show an area of concern in the proximal small bowel. Unfortunately, the small bowel enteroscopy did not reach this area. Plan is  for a balloon enteroscopy. Patient was started back on IV heparin. He has not experienced any further episodes of bleeding. So balloon enteroscopy can be pursued as an outpatient. GI will pursue referral to a tertiary center for the same. He was transitioned over from IV heparin to oral anticoagulation. It was felt that switching him over to Eliquis from [place] may lower his risk of bleeding. It  was felt that he was too high risk for embolic event if left off anticoagulation.  Anemia, acute blood loss Anemia of acute blood loss secondary to GI bleed, hemoglobin nadir was 6.3. Status post transfusion of total 4 units of packed RBCs. Patient was also given IV iron.  CAD CABG 1993-LIMA to the LAD, SVG to Om and SVG to PDA/PLA, patent on cath 2014. Currently stable. Will hold his aspirin so as not to increase significantly his risk of bleeding.  Chronic a-fib Rate is controlled with beta blockers. History of renal infarct. CHA2DS2-VASc is >3, was on Xarelto. It was held due to bleed. It was felt that holding anticoagulation for prolonged period of time will unnecessarily expose him to embolic events. He has a history of having embolic events when taken off of anticoagulation. After discussions with gastroenterology it was felt that he could be switched over to eliquis, which has a lower bleeding risk based on studies. Patient is agreeable. Will continue to recommend holding his aspirin. Was changed over and has not experienced any bleeding so far.   PVD -40-98% LICA stenosis, 11% RICA by doppler and 12/02/2015, follows with cardiology as outpatient. -Follows with Dr. Claiborne Billings, per last note he does not want surgery.  Chronic renal failure, stage 3 -Currently around his baseline.  Restless leg syndrome -Patient is on Requip, restarted, likely this is being exacerbated by iron deficiency. Status post IV iron infusion.  Fever on the morning of 3/6 Patient noted to have a temperature 102 on 3/6.  Afebrile since then. Source of this fever is not known. WBC is normal. Did not have any further episodes of fever.  Patient has remained stable for the last 48 hours. He hasn't had any episodes of bleeding. Hemoglobin has been stable. Okay for discharge home today.   PERTINENT LABS:  The results of significant diagnostics from this hospitalization (including imaging, microbiology, ancillary and laboratory) are listed below for reference.    Microbiology: Recent Results (from the past 240 hour(s))  Culture, blood (routine x 2)     Status: None (Preliminary result)   Collection Time: 05/19/16  2:28 AM  Result Value Ref Range Status   Specimen Description BLOOD LEFT ANTECUBITAL  Final   Special Requests IN PEDIATRIC BOTTLE 3CC  Final   Culture NO GROWTH 3 DAYS  Final   Report Status PENDING  Incomplete  Culture, blood (routine x 2)     Status: None (Preliminary result)   Collection Time: 05/19/16  2:31 AM  Result Value Ref Range Status   Specimen Description BLOOD LEFT HAND  Final   Special Requests IN PEDIATRIC BOTTLE 3CC  Final   Culture NO GROWTH 3 DAYS  Final   Report Status PENDING  Incomplete     Labs: Basic Metabolic Panel:  Recent Labs Lab 05/17/16 0402 05/18/16 0326 05/19/16 0222 05/20/16 1054 05/21/16 0355  NA 139 139 135 135 136  K 3.9 3.6 3.6 3.8 3.8  CL 111 110 103 99* 102  CO2 23 25 24 28 28   GLUCOSE 103* 107* 132* 101* 117*  BUN 23* 22* 14 11 10   CREATININE 1.52* 1.51* 1.56* 1.45* 1.49*  CALCIUM 8.5* 8.0* 8.0* 8.2* 8.0*   Liver Function Tests:  Recent Labs Lab 05/15/16 2324  AST 17  ALT 10*  ALKPHOS 59  BILITOT 0.4  PROT 6.1*  ALBUMIN 2.9*   CBC:  Recent Labs Lab 05/15/16 2324  05/18/16 0326 05/18/16 1031 05/19/16 0222 05/20/16 1054 05/21/16 0355 05/22/16 0915  WBC 5.6  < > 7.2 5.8 11.1* 8.6 6.6  --   NEUTROABS 2.5  --   --   --   --   --   --   --   HGB 6.3*  < > 6.8* 8.0* 9.0* 9.0* 8.1* 8.7*  HCT 19.9*  < > 21.0* 25.0* 27.8* 27.7*  25.1* 27.1*  MCV 88.4  < > 86.4 86.5 85.3 86.0 86.9  --   PLT 237  < > 190 197 192 202 198  --   < > = values in this interval not displayed.  CBG:  Recent Labs Lab 05/16/16 1721  GLUCAP 90     IMAGING STUDIES Dg Chest 2 View  Result Date: 05/15/2016 CLINICAL DATA:  Shortness of breath for 3 days EXAM: CHEST  2 VIEW COMPARISON:  08/09/15 FINDINGS: Cardiac shadow remains enlarged. Postsurgical changes are noted. The lungs are clear bilaterally. No acute bony abnormality is seen. IMPRESSION: No acute abnormality noted. Electronically Signed   By: Inez Catalina M.D.   On: 05/15/2016 17:11   Ct Head Wo Contrast  Result Date: 05/15/2016 CLINICAL DATA:  Dizziness starting yesterday, possible CVA EXAM: CT HEAD WITHOUT CONTRAST TECHNIQUE: Contiguous axial images were obtained from the base of the skull through the vertex without intravenous contrast. COMPARISON:  07/30/2015 FINDINGS: Brain: No intracranial hemorrhage, mass effect or midline shift. Stable cerebral atrophy. Stable periventricular and patchy subcortical chronic white matter disease. Old right basal ganglia infarct is stable. No definite acute cortical infarction. No mass lesion is noted on this unenhanced scan. Ventricular size is stable from prior exam. Vascular: Atherosclerotic calcifications of carotid siphon again noted. Skull: No skull fracture is noted. Sinuses/Orbits: No paranasal sinuses air-fluid levels. The mastoid air cells are unremarkable. Other: None IMPRESSION: No acute intracranial abnormality. Stable old infarct in right basal ganglia. Stable atrophy and chronic white matter disease. No definite acute cortical infarction. No mass lesion is noted on this unenhanced scan. Electronically Signed   By: Lahoma Crocker M.D.   On: 05/15/2016 17:13   Ct Entero Abd/pelvis W Contast  Result Date: 05/20/2016 CLINICAL DATA:  Possible submucosal lesion on capsule endoscopy in the small bowel. Severe anemia. EXAM: CT ABDOMEN AND PELVIS WITH  CONTRAST (ENTEROGRAPHY) TECHNIQUE: Multidetector CT of the abdomen and pelvis during bolus administration of intravenous contrast. Negative oral contrast was given. CONTRAST:  100 ISOVUE-300 IOPAMIDOL (ISOVUE-300) INJECTION 61% COMPARISON:  CT abdomen pelvis 08/10/2015. FINDINGS: Lower chest: Image quality is degraded by respiratory motion. There is peribronchovascular ground-glass nodularity and consolidation bilaterally, right greater than left. Heart is enlarged. No pericardial effusion. Small bilateral pleural effusions. Fluid is seen in the distal esophagus. Hepatobiliary: Liver and gallbladder are unremarkable. No biliary ductal dilatation. Pancreas: Negative. Spleen: Negative. Adrenals/Urinary Tract: Adrenal glands are unremarkable. Low-attenuation lesions in the kidneys measure up to 2.0 cm on the right and are likely cysts. Likely renal vascular calcifications on the right. Ureters are decompressed. Bladder is unremarkable. Stomach/Bowel: Stomach is unremarkable. There is poor small bowel distention, limiting evaluation. Endoscopic capsule is seen in the descending colon. Colon is otherwise unremarkable. Appendix is not visualized. Vascular/Lymphatic: Atherosclerotic calcification of the arterial vasculature without abdominal aortic aneurysm. Circumaortic left renal vein. No pathologically enlarged lymph nodes. Reproductive: Prostate is visualized. Other: Small bilateral inguinal hernias contain fat. Mesenteries and peritoneum are unremarkable. Musculoskeletal: No worrisome lytic or sclerotic lesions. Degenerative changes are seen in the spine. IMPRESSION: 1. Poor distention limits evaluation of the small bowel. 2. Bibasilar pulmonary parenchymal peribronchovascular  ground-glass nodularity and consolidation, indicative of an infectious bronchiolitis/bronchopneumonia. 3. Small bilateral pleural effusions. 4.  Aortic atherosclerosis (ICD10-170.0). Electronically Signed   By: Lorin Picket M.D.   On:  05/20/2016 07:19    DISCHARGE EXAMINATION: Vitals:   05/21/16 1311 05/21/16 2154 05/22/16 0529 05/22/16 1457  BP: (!) 145/58 (!) 145/50 (!) 153/63 (!) 126/55  Pulse: 61 (!) 52 63 63  Resp:  17 19 18   Temp: 97.6 F (36.4 C) 98.2 F (36.8 C) 98.4 F (36.9 C) 98.7 F (37.1 C)  TempSrc: Oral Oral Oral Oral  SpO2: 99% 100% 95%   Weight:      Height:       General appearance: alert, cooperative, appears stated age and no distress Resp: clear to auscultation bilaterally Cardio: regular rate and rhythm, S1, S2 normal, no murmur, click, rub or gallop GI: soft, non-tender; bowel sounds normal; no masses,  no organomegaly Extremities: extremities normal, atraumatic, no cyanosis or edema  DISPOSITION: Home  Discharge Instructions    Call MD for:  extreme fatigue    Complete by:  As directed    Call MD for:  persistant dizziness or light-headedness    Complete by:  As directed    Call MD for:  persistant nausea and vomiting    Complete by:  As directed    Call MD for:  severe uncontrolled pain    Complete by:  As directed    Call MD for:  temperature >100.4    Complete by:  As directed    Diet - low sodium heart healthy    Complete by:  As directed    Discharge instructions    Complete by:  As directed    Dr. Carlean Purl will arrange for referral to another hospital for your procedure. They will also arrange for blood work to be checked next week. Continue to hold aspirin for now. Seek attention immediately if you notice black colored stools or blood in urine or stools.  You were cared for by a hospitalist during your hospital stay. If you have any questions about your discharge medications or the care you received while you were in the hospital after you are discharged, you can call the unit and asked to speak with the hospitalist on call if the hospitalist that took care of you is not available. Once you are discharged, your primary care physician will handle any further medical issues.  Please note that NO REFILLS for any discharge medications will be authorized once you are discharged, as it is imperative that you return to your primary care physician (or establish a relationship with a primary care physician if you do not have one) for your aftercare needs so that they can reassess your need for medications and monitor your lab values. If you do not have a primary care physician, you can call [phone] for a physician referral.   Increase activity slowly    Complete by:  As directed       ALLERGIES:  Allergies  Allergen Reactions  . Percocet [Oxycodone-Acetaminophen] Itching and Nausea Only      Discharge Medication List as of 05/22/2016  4:11 PM    START taking these medications   Details  apixaban (ELIQUIS) 2.5 MG TABS tablet Take 1 tablet (2.5 mg total) by mouth 2 (two) times daily., Starting Fri 05/22/2016, Print      CONTINUE these medications which have NOT CHANGED   Details  acetaminophen (TYLENOL) 500 MG tablet Take 500-1,000 mg by mouth every  6 (six) hours as needed for mild pain., Historical Med    carvedilol (COREG) 3.125 MG tablet Take 1 tablet (3.125 mg total) by mouth 2 (two) times daily with a meal., Starting Thu 12/12/2015, Normal    furosemide (LASIX) 40 MG tablet TAKE 1/2 TABLET(20 MG) BY MOUTH DAILY, Normal    lisinopril (PRINIVIL,ZESTRIL) 10 MG tablet Take 0.5 tablets (5 mg total) by mouth daily., Starting Thu 01/16/2016, No Print    pantoprazole (PROTONIX) 40 MG tablet TAKE 1 TABLET BY MOUTH EVERY DAY, Normal    rOPINIRole (REQUIP) 0.25 MG tablet Take 1 tablet (0.25 mg total) by mouth 2 (two) times daily., Starting Fri 05/15/2016, Normal    rosuvastatin (CRESTOR) 40 MG tablet TAKE 1 TABLET(40 MG) BY MOUTH DAILY, Normal    traMADol (ULTRAM) 50 MG tablet TAKE ONE-HALF TABLET BY MOUTH EVERY 8 HOURS AS NEEDED FOR PAIN, Phone In      STOP taking these medications     aspirin EC 81 MG tablet      Rivaroxaban (XARELTO) 15 MG TABS tablet           Follow-up Information    [name],JESSICA, MD. Schedule an appointment as soon as possible for a visit in 1 week(s).   Specialty:  Family Medicine Contact information: [address] [place] [address] [phone]        Silvano Rusk, MD Follow up.   Specialty:  Gastroenterology Contact information: [address] [place] [address] [phone]           TOTAL DISCHARGE TIME: 35 minutes  [facility] Pager [phone]  05/22/2016, 4:56 PM

## 2016-05-22 NOTE — Progress Notes (Signed)
          Daily Rounding Note  05/22/2016, 9:35 AM  LOS: 6 days   SUBJECTIVE:   Chief complaint: weakness.    Feels stronger.  No SOB.  No pain.  Large brown BM this morning  OBJECTIVE:         Vital signs in last 24 hours:    Temp:  [97.6 F (36.4 C)-98.4 F (36.9 C)] 98.4 F (36.9 C) (03/09 0529) Pulse Rate:  [52-63] 63 (03/09 0529) Resp:  [17-19] 19 (03/09 0529) BP: (145-153)/(50-63) 153/63 (03/09 0529) SpO2:  [95 %-100 %] 95 % (03/09 0529) Last BM Date: 05/19/16 Filed Weights   05/18/16 1326 05/19/16 0435 05/20/16 0453  Weight: 88.5 kg (195 lb) 87.1 kg (192 lb 0.3 oz) 81.3 kg (179 lb 3.7 oz)   General: looks comfortable and well   Heart: RRR Chest: clear bil.  No labored breathing or cough. Abdomen: soft, active BS, NT, ND  Extremities: no CCE Neuro/Psych:  Oriented x 3.  Pleasant, cooperative, calm.    Intake/Output from previous day: 03/08 0701 - 03/09 0700 In: 720 [P.O.:720] Out: -   Intake/Output this shift: No intake/output data recorded.  Lab Results:  Recent Labs  05/20/16 1054 05/21/16 0355  WBC 8.6 6.6  HGB 9.0* 8.1*  HCT 27.7* 25.1*  PLT 202 198   BMET  Recent Labs  05/20/16 1054 05/21/16 0355  NA 135 136  K 3.8 3.8  CL 99* 102  CO2 28 28  GLUCOSE 101* 117*  BUN 11 10  CREATININE 1.45* 1.49*  CALCIUM 8.2* 8.0*   LFT No results for input(s): PROT, ALBUMIN, AST, ALT, ALKPHOS, BILITOT, BILIDIR, IBILI in the last 72 hours. PT/INR No results for input(s): LABPROT, INR in the last 72 hours. Hepatitis Panel No results for input(s): HEPBSAG, HCVAB, HEPAIGM, HEPBIGM in the last 72 hours.  Studies/Results: No results found.   Scheduled Meds: . apixaban  2.5 mg Oral BID  . carvedilol  3.125 mg Oral BID WC  . lisinopril  5 mg Oral Daily  . pantoprazole  40 mg Oral Q0600  . rOPINIRole  0.25 mg Oral BID  . traZODone  25 mg Oral QHS   Continuous Infusions: PRN Meds:.acetaminophen,  HYDROcodone-acetaminophen, LORazepam, ondansetron **OR** ondansetron (ZOFRAN) IV, sodium chloride flush   ASSESMENT:   *  IDA, transfusions x 4, Feraheme x 1, Nulecit x 1.  FOBT + in pt on zaroxolyn for afib, CVA, CAD.    3/4 EGD: Normal.    3/5 Colonoscopy to ileum: Left colon tics.  Removal 6 mm recto-sigmoid polyp.  Int rrhoids.   3/6 Capsule endo: non-bleedingsubmucosal lesion with erosion at 57 m, prominent venous stuctures at 75 m.   3/7 Enteroscopy: HH.  Schatski ring.    Hgb inmproved today  *  Afib. Hx CVA.  Now on Eliquis, day 2.    PLAN   *  Outpt tertiary center referral for spiral or balloon enteroscopy.  Dr Carlean Purl notified by messaging and  will arrange follow up.  Plan CBC next week as well at [facility] lab.  Asked pt and his wife to go to lab on Wed 14th or Thursday 15th.   Ok to discharge today.       Azucena Freed  05/22/2016, 9:35 AM Pager: [phone]

## 2016-05-22 NOTE — Discharge Instructions (Signed)

## 2016-05-22 NOTE — Telephone Encounter (Signed)
-----   Message from Vena Rua, PA-C sent at 05/22/2016 10:25 AM EST ----- Regarding: follow up CBC and tertiary referral Hi.  Can you arrange CBC next week for Mr Gregory [name]?  Also on CE has submucosal lesion in SB that Dr A could not reach on enteroscopy.  Admitted with anemia and no overt bleeding but FOBT +.  Transfused 4 PRBC and received iron infusion.  Switched from zaroxolyn to Eliquis for Afib, hx CVA.  Dr A feels he will need referral for advanced endoscopy ([facility]?) so not sure if you want to see pt back or go ahead and set up referral?   Thank you, Judson Roch.

## 2016-05-24 LAB — CULTURE, BLOOD (ROUTINE X 2)
Culture: NO GROWTH
Culture: NO GROWTH

## 2016-05-25 ENCOUNTER — Telehealth: Payer: Self-pay

## 2016-05-25 NOTE — Telephone Encounter (Signed)
Hospital follow up call completed. Appointment scheduled.

## 2016-05-25 NOTE — Telephone Encounter (Signed)
Per Dr. Carlean Purl will not refer for now.  He would like to review the labs and determine the next step from there.

## 2016-05-26 NOTE — Telephone Encounter (Signed)
I spoke to him and he wants to watch things  Please schedule him to see me in late April or so

## 2016-05-26 NOTE — Telephone Encounter (Signed)
The patient's wife will have the patient or her daughter to call back tomorrow to schedule the office visit

## 2016-05-27 ENCOUNTER — Other Ambulatory Visit (INDEPENDENT_AMBULATORY_CARE_PROVIDER_SITE_OTHER): Payer: Medicare Other

## 2016-05-27 DIAGNOSIS — K922 Gastrointestinal hemorrhage, unspecified: Secondary | ICD-10-CM | POA: Diagnosis not present

## 2016-05-27 LAB — CBC WITH DIFFERENTIAL/PLATELET
BASOS PCT: 0.5 % (ref 0.0–3.0)
Basophils Absolute: 0 10*3/uL (ref 0.0–0.1)
EOS ABS: 0.2 10*3/uL (ref 0.0–0.7)
Eosinophils Relative: 4 % (ref 0.0–5.0)
HCT: 32.9 % — ABNORMAL LOW (ref 39.0–52.0)
Hemoglobin: 10.6 g/dL — ABNORMAL LOW (ref 13.0–17.0)
LYMPHS ABS: 1.9 10*3/uL (ref 0.7–4.0)
Lymphocytes Relative: 31.1 % (ref 12.0–46.0)
MCHC: 32.4 g/dL (ref 30.0–36.0)
MCV: 89.3 fl (ref 78.0–100.0)
MONO ABS: 0.7 10*3/uL (ref 0.1–1.0)
Monocytes Relative: 11.7 % (ref 3.0–12.0)
NEUTROS ABS: 3.2 10*3/uL (ref 1.4–7.7)
NEUTROS PCT: 52.7 % (ref 43.0–77.0)
PLATELETS: 318 10*3/uL (ref 150.0–400.0)
RBC: 3.68 Mil/uL — ABNORMAL LOW (ref 4.22–5.81)
RDW: 18.4 % — ABNORMAL HIGH (ref 11.5–15.5)
WBC: 6 10*3/uL (ref 4.0–10.5)

## 2016-05-27 NOTE — Telephone Encounter (Signed)
Patient has been scheduled for 4/20

## 2016-05-27 NOTE — Progress Notes (Signed)
Hgb better Will see him at f/u as planned Have him repeat a CBC in 1 month

## 2016-05-27 NOTE — Addendum Note (Signed)
Addended by: Marlon Pel on: 05/27/2016 04:02 PM   Modules accepted: Orders

## 2016-06-01 ENCOUNTER — Ambulatory Visit (INDEPENDENT_AMBULATORY_CARE_PROVIDER_SITE_OTHER): Payer: Medicare Other | Admitting: Family Medicine

## 2016-06-01 ENCOUNTER — Encounter: Payer: Self-pay | Admitting: Family Medicine

## 2016-06-01 VITALS — BP 112/60 | HR 67 | Temp 98.6°F | Ht 66.0 in | Wt 189.2 lb

## 2016-06-01 DIAGNOSIS — Z09 Encounter for follow-up examination after completed treatment for conditions other than malignant neoplasm: Secondary | ICD-10-CM

## 2016-06-01 DIAGNOSIS — Z8719 Personal history of other diseases of the digestive system: Secondary | ICD-10-CM

## 2016-06-01 NOTE — Progress Notes (Signed)
[place] at [facility] [address] [place], [place] [address] [phone]  Date:  06/01/2016   Name:  Gregory [name]   DOB:  07/19/1932   MRN:  [id number]  PCP:  Lamar Blinks, MD    Chief Complaint: Hospitalization Follow-up   History of Present Illness:  Gregory [name] is a 83 y.o. very pleasant male patient who presents with the following:  Here today for hospital follow-up.  He was seen here on 3/2 and noted to have hg of 6.7- Admitted from 05-15-2016 to 05-22-2016.  He was found to have a small bowel bleed on capsule endoscopy which had caused symptomatic anemia.   Hospital course (3-2 to 3-9):g  Patient's baseline hemoglobin around 11 from 5 months ago. Presented with anemia with a hemoglobin of 6.3. Found to have low iron. He was heme positive in stool. Gastroenterology was consulted. Patient is status post EGD, colonoscopy and small bowel enteroscopy. He also underwent CT enterography. Capsule study did show an area of concern in the proximal small bowel. Unfortunately, the small bowel enteroscopy did not reach this area. Plan is for a balloon enteroscopy. Patient was started back on IV heparin. He has not experienced any further episodes of bleeding. So balloon enteroscopy can be pursued as an outpatient. GI will pursue referral to a tertiary center for the same. He was transitioned over from IV heparin to oral anticoagulation. It was felt that switching him over to Eliquis from [place] may lower his risk of bleeding. It was felt that he was too high risk for embolic event if left off anticoagulation.  HPI for today's visit:  Has follow-up appointment with GI Carlean Purl) on 07-03-2016.    Lab Results  Component Value Date   HGB 10.6 (L) 05/27/2016   Patient stated that he did received "4 pints of blood and 2 pints of iron in the hospital."  Patients states that he feels a lot better, energy back to normal, denies SOB, CP.   Patient and his daughter stated that GI wants to watch him and hopefully he will not need the balloon enteroscopy.  Denies any bleeding, bruising, hematemesis, hematochezia. He had never noted any blood in his stool or dark tarry stools He was changed from xarelto to eliquis at the hospital and is tolerating this well.  He did notice an increase in his RLS sx when his iron was so low- this is better now that his hg is higher again  Wt Readings from Last 3 Encounters:  06/01/16 189 lb 3.2 oz (85.8 kg)  05/20/16 179 lb 3.7 oz (81.3 kg)  05/15/16 192 lb 12.8 oz (87.5 kg)    He does have chronic renal failure and sees Dr. Posey Pronto who they like a lot. They have there next follow- up in April.  His creat was up to 2 while he was sick, but now back to normal  Overall he is feeling much better and is just here to recheck as he was instructed to do after hospital stay  BP Readings from Last 3 Encounters:  06/01/16 112/60  05/22/16 (!) 126/55  05/15/16 (!) 109/44      Patient Active Problem List   Diagnosis Date Noted  . Acute blood loss anemia   . Abnormal finding on GI tract imaging   . GIB (gastrointestinal bleeding) 05/16/2016  . Gastrointestinal hemorrhage with melena   . Symptomatic anemia   . Chronic anemia 01/13/2016  . Chronic  renal failure, stage 3 (moderate) 08/06/2015  . Vasovagal syncope   . Coronary artery disease involving native coronary artery   . 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 Syncope 07/30/2015  . Dysphagia   . Esophageal stricture   . Carotid stenosis 07/13/2013  . Anticoagulated on Xarelto 03/21/2013  . PVD - 16% LICA, 10% RICA by doppler 5/14 01/24/2013  . 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 CAD (coronary artery disease),CABG 1993-LIMA to the LAD, SVG to Om and SVG to PDA/PLA, patent on cath 2014. 03/31/2011  . Chronic a-fib ([place]) 03/31/2011  . Cardiomyopathy, ischemic, improved 03/31/2011  . CVA  (cerebral vascular accident) ([place]) 03/31/2011  . GERD (gastroesophageal reflux disease) 03/31/2011  . Renal calculi 03/31/2011  . History of tobacco use, continues with chewing tobacco 03/31/2011    Past Medical History:  Diagnosis Date  . Appendicitis with abscess 03/31/2011  . Arthritis   . CAD (coronary artery disease),CABG 1993-LIMA to the LAD, SVG to Om and SVG to PDA/PLA, patent on cath 2008. 2003   a. s/p CABG 2003. b. last cath 2008 with patent grafts.  . Carotid artery disease ([place])    a. Severe - pt refused intervention in the past.  . Chronic atrial fibrillation ([place])    permanent  . Chronic systolic CHF (congestive heart failure) ([place])    a. Prior low EF, later normalized.  . Essential hypertension   . GERD (gastroesophageal reflux disease)   . Heart attack 1980's  . Heart murmur   . Hyperlipidemia   . Ischemic cardiomyopathy   . Kidney stones    "passed all but one time when he had to have lithotripsy" (01/21/2013)  . Pulmonary hypertension   . Renal infarct ([place])    a. 07/2015 - after holding Xarelto x 3 days for spinal procedure.  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 SSS (sick sinus syndrome) ([place])   . Stroke [facility]) 2007   "slightly drags left foot since; recovered qthing else" (01/21/2013)  . Syncope 07/2015   a. felt vasovagal in setting of pain from renal infarct.    Past Surgical History:  Procedure Laterality Date  . BALLOON DILATION N/A 05/22/2015   Procedure: BALLOON DILATION;  Surgeon: Gatha Mayer, MD;  Location: [facility] ENDOSCOPY;  Service: Endoscopy;  Laterality: N/A;  . CARDIAC CATHETERIZATION  02/24/2002   reduced LV function, 60-70% prox RCA stenosis, 70% PLA ostial stenosis, 80% secondary branch of PLA stenosis - subsequent CABG (Dr. Jackie Plum)  . Carotid Doppler  06/2012   50-69% right bulb/prox ICA diameter reduction; 70-99% left bulb/prox ICA diameter reduction  . CATARACT EXTRACTION W/ INTRAOCULAR LENS IMPLANT Bilateral 2013  . COLONOSCOPY N/A 05/18/2016    Procedure: COLONOSCOPY;  Surgeon: Manus Gunning, MD;  Location: [facility] ENDOSCOPY;  Service: Gastroenterology;  Laterality: N/A;  . CORONARY ANGIOPLASTY  07/05/2006   3 vessel CAD, patent LIMA to LAD, patentVG to OM, patent SVG to PDA & PLA, mild MR, severe LV systolic dysfunction (Dr. Gerrie Nordmann)  . CORONARY ARTERY BYPASS GRAFT  03/01/2002   LIMA to LAD, reverse SVG to OM, reverse SVG to PDA of RCA, reverse SVG to PLA of RCA, ligation of LA appendage (Dr. Servando Snare)  . ENTEROSCOPY N/A 05/20/2016   Procedure: ENTEROSCOPY;  Surgeon: Manus Gunning, MD;  Location: [place];  Service: Gastroenterology;  Laterality: N/A;  . ESOPHAGOGASTRODUODENOSCOPY  02/01/2012   Procedure: ESOPHAGOGASTRODUODENOSCOPY (EGD);  Surgeon: Beryle Beams, MD;  Location:  [facility] ENDOSCOPY;  Service: Endoscopy;  Laterality: N/A;  . ESOPHAGOGASTRODUODENOSCOPY N/A 05/22/2015   Procedure: ESOPHAGOGASTRODUODENOSCOPY (EGD);  Surgeon: Gatha Mayer, MD;  Location: [facility];  Service: Endoscopy;  Laterality: N/A;  . ESOPHAGOGASTRODUODENOSCOPY N/A 05/17/2016   Procedure: ESOPHAGOGASTRODUODENOSCOPY (EGD);  Surgeon: Juanita Craver, MD;  Location[facility] END[facility]COPY;  Service: Endoscopy;  Laterality: N/A;  . GIVENS CAPSULE STUDY N/A 05/18/2016   Procedure: GIVENS CAPSULE STUDY;  Surgeon: Manus Gunning, MD;  Location: [place];  Service: Gastroenterology;  Laterality: N/A;  . LAPAROSCOPIC APPENDECTOMY  03/30/2011   Procedure: APPENDECTOMY LAPAROSCOPIC;  Surgeon: Joyice Faster. Cornett, MD;  Location: [place];  Service: General;  Laterality: N/A;  . LEFT HEART CATHETERIZATION WITH CORONARY ANGIOGRAM N/A 01/24/2013   Procedure: LEFT HEART CATHETERIZATION WITH CORONARY ANGIOGRAM;  Surgeon: [name], MD;  Location: [facility] CATH LAB;  Service: Cardiovascular;  Laterality: N/A;  Right heart with grafts  . LITHOTRIPSY     "once" (01/21/2013)  . NM MYOCAR PERF WALL MOTION  05/2009   persantine myoview - fixed moderate perfusion defect in  inferior wall & lateral segment of apex (poor non-transmural infarction), minimal anterolateral periinfarct reversible ischemia seen, abnormal study, defects similar to 2006 study  . SPLENECTOMY, TOTAL  01/2013  . TEE WITHOUT CARDIOVERSION N/A 01/23/2013   Procedure: TRANSESOPHAGEAL ECHOCARDIOGRAM (TEE);  Surgeon: Sanda Klein, MD;  Location: [facility] ENDOSCOPY;  Service: Cardiovascular;  Laterality: N/A;  . TRANSTHORACIC ECHOCARDIOGRAM  06/23/2012   EF 40-45%, mild LVH; mild AV regurg; mild MV regurg; LV mod-severely dilated; RV mildly dilated; systolic pressure borderline increased; RA mod dilated    Social History  Substance Use Topics  . Smoking status: Former Smoker    Packs/day: 2.00    Years: 20.00    Types: Cigarettes    Quit date: 04/08/1986  . Smokeless tobacco: Current User    Types: Chew  . Alcohol use No    Family History  Problem Relation Age of Onset  . Heart disease Father     rheumatic fever  . Heart attack Brother 61  . Stroke Mother   . Stroke Brother 62    Allergies  Allergen Reactions  . Percocet [Oxycodone-Acetaminophen] Itching and Nausea Only    Medication list has been reviewed and updated.  Current Outpatient Prescriptions on File Prior to Visit  Medication Sig Dispense Refill  . apixaban (ELIQUIS) 2.5 MG TABS tablet Take 1 tablet (2.5 mg total) by mouth 2 (two) times daily. 60 tablet 1  . carvedilol (COREG) 3.125 MG tablet Take 1 tablet (3.125 mg total) by mouth 2 (two) times daily with a meal. (Patient taking differently: Take 9.375 mg by mouth 2 times daily at 12 noon and 4 pm. 3 tablets 2X DAILY) 180 tablet 12  . furosemide (LASIX) 40 MG tablet TAKE 1/2 TABLET(20 MG) BY MOUTH DAILY 30 tablet 0  . lisinopril (PRINIVIL,ZESTRIL) 10 MG tablet Take 0.5 tablets (5 mg total) by mouth daily. (Patient taking differently: Take 10 mg by mouth daily. 1 WHOLE TABLET DAILY) 15 tablet 6  . pantoprazole (PROTONIX) 40 MG tablet TAKE 1 TABLET BY MOUTH EVERY DAY 90  tablet 3  . rOPINIRole (REQUIP) 0.25 MG tablet Take 1 tablet (0.25 mg total) by mouth 2 (two) times daily. 60 tablet 1  . rosuvastatin (CRESTOR) 40 MG tablet TAKE 1 TABLET(40 MG) BY MOUTH DAILY 30 tablet 8  . traMADol (ULTRAM) 50 MG tablet TAKE ONE-HALF TABLET BY MOUTH EVERY 8 HOURS AS NEEDED FOR PAIN 30 tablet 1  No current facility-administered medications on file prior to visit.     Review of Systems:  Review of Systems  Constitutional: Negative for chills, fever and malaise/fatigue.  Respiratory: Negative for cough, shortness of breath and wheezing.   Cardiovascular: Negative for chest pain, palpitations and leg swelling.  Gastrointestinal: Negative for blood in stool and melena.  Genitourinary: Negative for hematuria.  Neurological: Negative for dizziness, weakness and headaches.  Endo/Heme/Allergies: Does not bruise/bleed easily.  All other systems reviewed and are negative.    Physical Examination: Vitals:   06/01/16 1008  BP: 112/60  Pulse: 67  Temp: 98.6 F (37 C)   Vitals:   06/01/16 1008  Weight: 189 lb 3.2 oz (85.8 kg)  Height: 5\' 6"  (1.676 m)   Body mass index is 30.54 kg/m. Ideal Body Weight: Weight in (lb) to have BMI = 25: 154.6  Physical Examination: General appearance - alert, well appearing, and in no distress, oriented to person, place, and time and overweight Mental status - alert, oriented to person, place, and time, normal mood, behavior, speech, dress, motor activity, and thought processes Eyes - pupils equal and reactive, extraocular eye movements intact Mouth - mucous membranes moist, pharynx normal without lesions Neck - supple, no significant adenopathy Lymphatics - no palpable lymphadenopathy, no hepatosplenomegaly Chest - clear to auscultation, no wheezes, rales or rhonchi, symmetric air entry Heart - rate controlled a fib, normal S1, S2, no murmurs, rubs, clicks or gallops Abdomen - soft, nontender, nondistended, no masses or  organomegaly Extremities - peripheral pulses normal, no pedal edema, no clubbing or cyanosis Skin - scattered ecchymosis on bilateral arms, due to IVs and phlebotomy during hospital admission.   Assessment and Plan:  Encounter for examination following treatment at hospital  History of GI bleed Here today for a follow-up visit He has already seen GI and had a follow-up CBC which shows improvement. Encouraged him to continue to get adequate rest, to eat a healthful diet and to let us know if any concerns.   He is seeing GI in a month, and will see Dr. Posey Pronto late on this spring as well.  Lab Results  Component Value Date   WBC 6.0 05/27/2016   HGB 10.6 (L) 05/27/2016   HCT 32.9 (L) 05/27/2016   MCV 89.3 05/27/2016   PLT 318.0 05/27/2016    Patient Instructions:  Please come and see me in about 6 months and take care!    Signed Lamar Blinks, MD

## 2016-06-01 NOTE — Patient Instructions (Signed)
Please come and see me in about 6 months and take care!

## 2016-06-08 ENCOUNTER — Ambulatory Visit (INDEPENDENT_AMBULATORY_CARE_PROVIDER_SITE_OTHER): Payer: Medicare Other | Admitting: Physician Assistant

## 2016-06-08 VITALS — BP 104/55 | HR 67 | Temp 99.1°F | Resp 18 | Ht 66.0 in | Wt 185.2 lb

## 2016-06-08 DIAGNOSIS — J209 Acute bronchitis, unspecified: Secondary | ICD-10-CM | POA: Diagnosis not present

## 2016-06-08 DIAGNOSIS — J3489 Other specified disorders of nose and nasal sinuses: Secondary | ICD-10-CM

## 2016-06-08 DIAGNOSIS — R05 Cough: Secondary | ICD-10-CM

## 2016-06-08 DIAGNOSIS — R059 Cough, unspecified: Secondary | ICD-10-CM

## 2016-06-08 MED ORDER — AZITHROMYCIN 250 MG PO TABS: ORAL_TABLET | ORAL | 0 refills | Status: DC

## 2016-06-08 MED ORDER — FLUTICASONE PROPIONATE 50 MCG/ACT NA SUSP: 2.0000 | Freq: Every day | NASAL | 6 refills | Status: DC

## 2016-06-08 MED ORDER — BENZONATATE 100 MG PO CAPS: 100.0000 mg | ORAL_CAPSULE | Freq: Three times a day (TID) | ORAL | 0 refills | Status: DC | PRN

## 2016-06-08 NOTE — Patient Instructions (Addendum)
Flonase - 2 sprays each nostril 2x/day.  Azithromycin - take this antibiotic as prescribed. Finish the entire course even if you start to feel better.  Tessalon - for cough suppression. Take as directed.  Please stay well hydrated - drink 32-64 oz water/day Come back in 5-7 days if you are not better.    Thank you for coming in today. I hope you feel we met your needs.  Feel free to call [facility] if you have any questions or further requests.  Please consider signing up for MyChart if you do not already have it, as this is a great way to communicate with me.  Best,  Whitney McVey, PA-C   IF you received an x-ray today, you will receive an invoice from [facility]. Please contact [facility] at [phone] with questions or concerns regarding your invoice.   IF you received labwork today, you will receive an invoice from [place] [facility]. Please contact [facility] at [phone] with questions or concerns regarding your invoice.   Our billing staff will not be able to assist you with questions regarding bills from these companies.  You will be contacted with the lab results as soon as they are available. The fastest way to get your results is to activate your My Chart account. Instructions are located on the last page of this paperwork. If you have not heard from [place] regarding the results in 2 weeks, please contact this office.

## 2016-06-08 NOTE — Progress Notes (Signed)
Gregory [name]  MRN: [id number] DOB: 12-15-1932  PCP: Lamar Blinks, MD  Subjective:  Pt is an 83 year old male PMH CAD, chronic A-fib, CVA, PVD, carotid stenosis, GI bleed, chronic renal failure, splenic infarct, chronic anticoagulation therapy, who presents to clinic for cough, sore throat and fatigue x 5 days. Symptoms are worsening.  He has not taken anything Denies chills, fever, headache, syncope, sore throat.   Review of Systems  Constitutional: Positive for fatigue. Negative for chills and fever.  HENT: Positive for congestion.   Respiratory: Positive for cough.   Psychiatric/Behavioral: Negative for sleep disturbance.    Patient Active Problem List   Diagnosis Date Noted  . Acute blood loss anemia   . Abnormal finding on GI tract imaging   . GIB (gastrointestinal bleeding) 05/16/2016  . Gastrointestinal hemorrhage with melena   . Symptomatic anemia   . Chronic anemia 01/13/2016  . Chronic renal failure, stage 3 (moderate) 08/06/2015  . Vasovagal syncope   . Coronary artery disease involving native coronary artery   . Renal infarction ([place]) 07/31/2015  . Syncope 07/30/2015  . Dysphagia   . Esophageal stricture   . Carotid stenosis 07/13/2013  . Anticoagulated on Xarelto 03/21/2013  . PVD - 93% LICA, 81% RICA by doppler 5/14 01/24/2013  . 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 CAD (coronary artery disease),CABG 1993-LIMA to the LAD, SVG to Om and SVG to PDA/PLA, patent on cath 2014. 03/31/2011  . Chronic a-fib ([place]) 03/31/2011  . Cardiomyopathy, ischemic, improved 03/31/2011  . CVA (cerebral vascular accident) ([place]) 03/31/2011  . GERD (gastroesophageal reflux disease) 03/31/2011  . Renal calculi 03/31/2011  . History of tobacco use, continues with chewing tobacco 03/31/2011    Current Outpatient Prescriptions on File Prior to Visit  Medication Sig Dispense Refill  . apixaban (ELIQUIS) 2.5 MG TABS  tablet Take 1 tablet (2.5 mg total) by mouth 2 (two) times daily. 60 tablet 1  . carvedilol (COREG) 3.125 MG tablet Take 1 tablet (3.125 mg total) by mouth 2 (two) times daily with a meal. (Patient taking differently: Take 9.375 mg by mouth 2 times daily at 12 noon and 4 pm. 3 tablets 2X DAILY) 180 tablet 12  . furosemide (LASIX) 40 MG tablet TAKE 1/2 TABLET(20 MG) BY MOUTH DAILY 30 tablet 0  . lisinopril (PRINIVIL,ZESTRIL) 10 MG tablet Take 0.5 tablets (5 mg total) by mouth daily. (Patient taking differently: Take 10 mg by mouth daily. 1 WHOLE TABLET DAILY) 15 tablet 6  . pantoprazole (PROTONIX) 40 MG tablet TAKE 1 TABLET BY MOUTH EVERY DAY 90 tablet 3  . rOPINIRole (REQUIP) 0.25 MG tablet Take 1 tablet (0.25 mg total) by mouth 2 (two) times daily. 60 tablet 1  . rosuvastatin (CRESTOR) 40 MG tablet TAKE 1 TABLET(40 MG) BY MOUTH DAILY 30 tablet 8  . traMADol (ULTRAM) 50 MG tablet TAKE ONE-HALF TABLET BY MOUTH EVERY 8 HOURS AS NEEDED FOR PAIN 30 tablet 1   No current facility-administered medications on file prior to visit.     Allergies  Allergen Reactions  . Percocet [Oxycodone-Acetaminophen] Itching and Nausea Only     Objective:  BP (!) 104/55   Pulse 67   Temp 99.1 F (37.3 C) (Oral)   Resp 18   Ht 5\' 6"  (1.676 m)   Wt 185 lb 3.2 oz (84 kg)   SpO2 98%   BMI 29.89 kg/m   Physical Exam  Constitutional: He is oriented  to person, place, and time and well-developed, well-nourished, and in no distress. No distress.  HENT:  Right Ear: Tympanic membrane normal.  Left Ear: Tympanic membrane normal.  Nose: Mucosal edema present. No rhinorrhea. Right sinus exhibits no maxillary sinus tenderness and no frontal sinus tenderness. Left sinus exhibits no maxillary sinus tenderness and no frontal sinus tenderness.  Mouth/Throat: Oropharynx is clear and moist and mucous membranes are normal.  Cardiovascular: Normal rate, regular rhythm and normal heart sounds.   Pulmonary/Chest: Effort normal.  No respiratory distress. He has rhonchi in the left lower field.  Neurological: He is alert and oriented to person, place, and time. GCS score is 15.  Skin: Skin is warm and dry.  Psychiatric: Mood, memory, affect and judgment normal.  Vitals reviewed.   Assessment and Plan :  1. Acute bronchitis, unspecified organism 2. Cough 3. Nasal drainage - azithromycin (ZITHROMAX) 250 MG tablet; Take 2 tabs PO x 1 dose, then 1 tab PO QD x 4 days  Dispense: 6 tablet; Refill: 0 - benzonatate (TESSALON) 100 MG capsule; Take 1-2 capsules (100-200 mg total) by mouth 3 (three) times daily as needed for cough.  Dispense: 40 capsule; Refill: 0 - fluticasone (FLONASE) 50 MCG/ACT nasal spray; Place 2 sprays into both nostrils daily.  Dispense: 16 g; Refill: 6 - Supportive care: Stay hydrated, rest. RTC in 5-7 days if no improvement.   Mercer Pod, PA-C  [facility] [place] 06/08/2016 3:13 PM

## 2016-06-09 ENCOUNTER — Ambulatory Visit: Payer: Medicare Other | Admitting: Medical

## 2016-06-16 ENCOUNTER — Telehealth: Payer: Self-pay | Admitting: Internal Medicine

## 2016-06-16 NOTE — Telephone Encounter (Signed)
Daughter contacted. The patient is talking in the background telling her what he wants asked and related to me. He feel tired and weak. He is supposed to have his CBC checked next week. He would like to come in tomorrow and have it done instead. He is not seeing any blood. Understands if he gets worse during the night he should go to the ER. Otherwise he will come in the morning for CBC. His follow up appointment is 07/03/16 with Dr Carlean Purl.

## 2016-06-17 ENCOUNTER — Other Ambulatory Visit (INDEPENDENT_AMBULATORY_CARE_PROVIDER_SITE_OTHER): Payer: Medicare Other

## 2016-06-17 DIAGNOSIS — K922 Gastrointestinal hemorrhage, unspecified: Secondary | ICD-10-CM | POA: Diagnosis not present

## 2016-06-17 LAB — CBC WITH DIFFERENTIAL/PLATELET
BASOS ABS: 0 10*3/uL (ref 0.0–0.1)
Basophils Relative: 0.5 % (ref 0.0–3.0)
Eosinophils Absolute: 0.3 10*3/uL (ref 0.0–0.7)
Eosinophils Relative: 3.8 % (ref 0.0–5.0)
HCT: 33.2 % — ABNORMAL LOW (ref 39.0–52.0)
Hemoglobin: 11.3 g/dL — ABNORMAL LOW (ref 13.0–17.0)
LYMPHS ABS: 2.3 10*3/uL (ref 0.7–4.0)
Lymphocytes Relative: 26.5 % (ref 12.0–46.0)
MCHC: 34.1 g/dL (ref 30.0–36.0)
MCV: 86.3 fl (ref 78.0–100.0)
MONO ABS: 0.9 10*3/uL (ref 0.1–1.0)
MONOS PCT: 11 % (ref 3.0–12.0)
NEUTROS ABS: 4.9 10*3/uL (ref 1.4–7.7)
NEUTROS PCT: 58.2 % (ref 43.0–77.0)
Platelets: 326 10*3/uL (ref 150.0–400.0)
RBC: 3.84 Mil/uL — AB (ref 4.22–5.81)
RDW: 17.4 % — ABNORMAL HIGH (ref 11.5–15.5)
WBC: 8.5 10*3/uL (ref 4.0–10.5)

## 2016-06-17 NOTE — Progress Notes (Signed)
Hgb is better I do not think his fatigue is from Hgb 11 See that he has bronchitis - I suspect that may be problem or other chronic medical issues He can f.u PCP for fatigue

## 2016-06-22 ENCOUNTER — Other Ambulatory Visit: Payer: Self-pay | Admitting: Cardiovascular Disease

## 2016-06-22 ENCOUNTER — Other Ambulatory Visit: Payer: Self-pay | Admitting: Family

## 2016-06-23 NOTE — Telephone Encounter (Signed)
eScribe request from [facility] for refill on Requip 0.25mg  Last filled - 05/15/16, #60x1 Last AEX - 06/01/16 Next AEX - 6-Mths Refill sent per [facility] refill protocol/SLS

## 2016-07-02 ENCOUNTER — Telehealth: Payer: Self-pay | Admitting: Family Medicine

## 2016-07-02 MED ORDER — TRAMADOL HCL 50 MG PO TABS: ORAL_TABLET | ORAL | 1 refills | Status: DC

## 2016-07-02 NOTE — Telephone Encounter (Signed)
Pt's wife asked for a refill of his tramadol while she was here today- will refill

## 2016-07-03 ENCOUNTER — Ambulatory Visit (INDEPENDENT_AMBULATORY_CARE_PROVIDER_SITE_OTHER): Payer: Medicare Other | Admitting: Internal Medicine

## 2016-07-03 ENCOUNTER — Encounter: Payer: Self-pay | Admitting: Internal Medicine

## 2016-07-03 ENCOUNTER — Encounter (INDEPENDENT_AMBULATORY_CARE_PROVIDER_SITE_OTHER): Payer: Self-pay

## 2016-07-03 VITALS — BP 104/52 | HR 76 | Ht 66.6 in | Wt 188.0 lb

## 2016-07-03 DIAGNOSIS — I482 Chronic atrial fibrillation, unspecified: Secondary | ICD-10-CM

## 2016-07-03 DIAGNOSIS — Z7901 Long term (current) use of anticoagulants: Secondary | ICD-10-CM

## 2016-07-03 DIAGNOSIS — D500 Iron deficiency anemia secondary to blood loss (chronic): Secondary | ICD-10-CM

## 2016-07-03 NOTE — Progress Notes (Signed)
   Gregory [name] 84 y.o. 15-Mar-1933 [id number]  Assessment & Plan:   Encounter Diagnoses  Name Primary?  . Blood loss anemia Yes  . Chronic a-fib ([place])   . Anticoagulated    Recent GI bleed on Xarelto - w/u negative except small erosion in jejunum - failed to reach it w/ push enteroscopy  Doing ok now Has had an iron infusion and blood No further bleeding Must take anti-coag Tx if at all possible (is) for stroke prophylaxis  I recommend Fe SO4 325 mg qd chronically See PCP and get q 3-4 month CBC's See me prn  I appreciate the opportunity to care for this patient. MV:HQIONGE,XBMWUXL, MD  Subjective:   Chief Complaint: f/u anemia, GI bleeding   HPI Here w/ daughter - doing well CBC Latest Ref Rng & Units 06/17/2016 05/27/2016 05/22/2016  WBC 4.0 - 10.5 K/uL 8.5 6.0 -  Hemoglobin 13.0 - 17.0 g/dL 11.3(L) 10.6(L) 8.7(L)  Hematocrit 39.0 - 52.0 % 33.2(L) 32.9(L) 27.1(L)  Platelets 150.0 - 400.0 K/uL 326.0 318.0 -   No bleeding after hospitalization where EGD, colonoscopy and capsule endo showed only a small erosion in jejunum. CT-enterography negative. Xarelto was dc and Eliquis started after hospitlization Review of Systems As above, getting over viral URI that caused fatigue  Objective:   Physical Exam BP (!) 104/52   Pulse 76   Ht 5' 6.6" (1.692 m)   Wt 188 lb (85.3 kg)   BMI 29.80 kg/m  NAD elderly  15 minutes time spent with patient > half in counseling coordination of care

## 2016-07-03 NOTE — Patient Instructions (Signed)
  Please take one Ferrous Sulfate daily.  This medicine may turn your stool black.  Please follow up with your primary care physician every 3 to 4 months for bloodwork (CBC/diff)   I appreciate the opportunity to care for you. Silvano Rusk, MD, [facility]

## 2016-07-22 DIAGNOSIS — N183 Chronic kidney disease, stage 3 (moderate): Secondary | ICD-10-CM | POA: Diagnosis not present

## 2016-07-22 DIAGNOSIS — N2581 Secondary hyperparathyroidism of renal origin: Secondary | ICD-10-CM | POA: Diagnosis not present

## 2016-07-27 ENCOUNTER — Other Ambulatory Visit: Payer: Self-pay | Admitting: Family

## 2016-07-27 ENCOUNTER — Other Ambulatory Visit: Payer: Self-pay | Admitting: Cardiovascular Disease

## 2016-07-27 NOTE — Telephone Encounter (Signed)
New message      *STAT* If patient is at the pharmacy, call can be transferred to refill team.   1. Which medications need to be refilled? (please list name of each medication and dose if known)    Medications    apixaban (ELIQUIS) 2.5 MG TABS tablet Take 1 tablet (2.5 mg total) by mouth 2 (two) times daily.    lisinopril (PRINIVIL,ZESTRIL) 10 MG tablet Take 0.5 tablets (5 mg total) by mouth daily. Patient taking differently: Take 10 mg by mouth daily. 1 WHOLE TABLET DAILY   carvedilol (COREG) 3.125 MG tablet TAKE 3 TABLETS BY MOUTH TWICE DAILY WITH MEALS     2. Which pharmacy/location (including street and city if local pharmacy) is medication to be sent to?[facility] [address]  3. Do they need a 30 day or 90 day supply? [place]

## 2016-07-28 NOTE — Telephone Encounter (Signed)
Rx request Declined, too early for request according to last refill authorization/SLS 05/15  Medication Detail    Disp Refills Start End   rOPINIRole (REQUIP) 0.25 MG tablet 180 tablet 1 06/23/2016    Sig: TAKE 1 TABLET(0.25 MG) BY MOUTH TWICE DAILY   Notes to Pharmacy: **Patient requests 90 days supply**   E-Prescribing Status: Receipt confirmed by pharmacy (06/23/2016 1:06 PM EDT)   Pharmacy  [id number] - [place], [place] - [address] AT [address] [place]

## 2016-07-29 ENCOUNTER — Ambulatory Visit: Payer: Medicare Other | Admitting: Internal Medicine

## 2016-07-29 ENCOUNTER — Telehealth: Payer: Self-pay | Admitting: Cardiovascular Disease

## 2016-07-29 ENCOUNTER — Telehealth: Payer: Self-pay | Admitting: Family Medicine

## 2016-07-29 DIAGNOSIS — D631 Anemia in chronic kidney disease: Secondary | ICD-10-CM | POA: Diagnosis not present

## 2016-07-29 DIAGNOSIS — I10 Essential (primary) hypertension: Secondary | ICD-10-CM | POA: Diagnosis not present

## 2016-07-29 DIAGNOSIS — N2581 Secondary hyperparathyroidism of renal origin: Secondary | ICD-10-CM | POA: Diagnosis not present

## 2016-07-29 DIAGNOSIS — N183 Chronic kidney disease, stage 3 (moderate): Secondary | ICD-10-CM | POA: Diagnosis not present

## 2016-07-29 MED ORDER — ROPINIROLE HCL 0.25 MG PO TABS: ORAL_TABLET | ORAL | 3 refills | Status: DC

## 2016-07-29 MED ORDER — APIXABAN 2.5 MG PO TABS: 2.5000 mg | ORAL_TABLET | Freq: Two times a day (BID) | ORAL | 11 refills | Status: DC

## 2016-07-29 MED ORDER — CARVEDILOL 3.125 MG PO TABS: ORAL_TABLET | ORAL | 3 refills | Status: DC

## 2016-07-29 NOTE — Telephone Encounter (Signed)
Pt req 90 days supply script for ropinirole (requip) 0.25 mg bid to be called into [facility] [address] [facility]. Daughter [name] ph [phone]. Dr Lorelei Pont.

## 2016-07-29 NOTE — Telephone Encounter (Signed)
Patient daughter Gregory [name]) calling states that patient is out of Eliquis medication and states that when she tried to  have it refilled, the pharmacy stated  the refill was denied by our office. Mrs. Gregory [name] also states that the pharmacy changed patient carvedilol medication from 3 tablets 2x a day, to one tablet. Please call to discuss, as patient is out of Eliquis medication.

## 2016-07-29 NOTE — Telephone Encounter (Signed)
Spoke daughter. Patient received in correct  Direction for carvedilol 3.125 mg take 1 tablet twice a day and eliquis 2.5 mg has not been filled.  RN informed daughter - will contact pharmacy to give new order. Verbalized understanding.  RN spoke to pharmacist Ugi-  States the above prescription was on patient's profile. The  E-sent prescription from 07/28/16 not present.  she also states the eliquis prescription had been sent to hospitalist on prescription-to refill.  Patient was discontinue off xarelto,and started of eliquis while in the hospital  RN  VERBAL OVER THE PHONE  ORDER FOR  ELIQUIS 2.5 MG  ONE TABLET TWICE A DAY  #60 X 11   CARVEDILOL 3.125 MG  TAKE 3 TABLETS TWICE A DAY  #540 X 3  PHARMACIST-UGI - RECEIVED REFILLS-  EILIQUIS CAN BE PICKED UP TODAY . AND CARVEDILOL WILL BE PLACED ON FILED. PATIENT WILL NEED TO CONTACT PHARMACY WHEN CARVEDILOL IS NEEDED.   RN RELAYED INFORMATION TO DAUGHTER. DAUGHTER VERBALIZED UNDERSTANDING.

## 2016-07-30 ENCOUNTER — Other Ambulatory Visit: Payer: Self-pay

## 2016-07-30 MED ORDER — ROPINIROLE HCL 0.25 MG PO TABS: ORAL_TABLET | ORAL | 3 refills | Status: DC

## 2016-07-30 NOTE — Telephone Encounter (Signed)
Rx sent to pharmacy   

## 2016-09-21 ENCOUNTER — Other Ambulatory Visit: Payer: Self-pay | Admitting: Cardiovascular Disease

## 2016-09-22 ENCOUNTER — Encounter: Payer: Self-pay | Admitting: Cardiovascular Disease

## 2016-09-22 ENCOUNTER — Ambulatory Visit (INDEPENDENT_AMBULATORY_CARE_PROVIDER_SITE_OTHER): Payer: Medicare Other | Admitting: Cardiovascular Disease

## 2016-09-22 VITALS — BP 118/56 | HR 82 | Ht 66.5 in | Wt 184.2 lb

## 2016-09-22 DIAGNOSIS — I779 Disorder of arteries and arterioles, unspecified: Secondary | ICD-10-CM | POA: Diagnosis not present

## 2016-09-22 DIAGNOSIS — E785 Hyperlipidemia, unspecified: Secondary | ICD-10-CM | POA: Diagnosis not present

## 2016-09-22 DIAGNOSIS — I2510 Atherosclerotic heart disease of native coronary artery without angina pectoris: Secondary | ICD-10-CM

## 2016-09-22 DIAGNOSIS — I739 Peripheral vascular disease, unspecified: Secondary | ICD-10-CM

## 2016-09-22 DIAGNOSIS — I482 Chronic atrial fibrillation, unspecified: Secondary | ICD-10-CM

## 2016-09-22 DIAGNOSIS — Z7901 Long term (current) use of anticoagulants: Secondary | ICD-10-CM | POA: Diagnosis not present

## 2016-09-22 DIAGNOSIS — N289 Disorder of kidney and ureter, unspecified: Secondary | ICD-10-CM

## 2016-09-22 DIAGNOSIS — Z8673 Personal history of transient ischemic attack (TIA), and cerebral infarction without residual deficits: Secondary | ICD-10-CM

## 2016-09-22 MED ORDER — CARVEDILOL 3.125 MG PO TABS: 3.1250 mg | ORAL_TABLET | Freq: Two times a day (BID) | ORAL | 3 refills | Status: DC

## 2016-09-22 NOTE — Progress Notes (Signed)
Patient ID: Gregory [name], male   DOB: 1932-10-23, 83 y.o.   MRN: [id number]     HPI: Gregory [name] is a 83 y.o. male returns for an 8 month followup cardiology evaluation.  He is a former patient of Dr. Rollene Fare.    Gregory [name] has a history of known CAD in 2003 underwent CABG revascularization surgery by Dr. Servando Snare  (LIMA  to  LAD, vein graft to the obtuse marginal, sequential vein graft to the PDA and PLA of RCA).  His last cardiac catheterization was in 2008 done by Dr. Tami Ribas. At that time, ejection fraction was 20-25%. He declined consideration for ICD. Ejection fraction improved to approximately 35-40% in September 2012 on medical therapy. In the past, he has refused Coumadin therapy and had been maintained on aspirin and Plavix. In 2013 he was hospitalized for appendicitis with abscess requiring laparoscopic appendectomy. He was hospitalized in November 2014 and was felt to have a splenic infarct of embolic etiology. He underwent a transesophageal echocardiogram on 01/23/2013 by Dr. Sallyanne Kuster which revealed an ejection fraction in the range of 15-25%. There was diffuse hypokinesis with severe hypokinesis to akinesis of the anteroseptal apical myocardium. He had increased left ventricular end-diastolic filling pressure and elevated left atrial filling pressure. There was evidence for large solid fixed thrombus occupying the entire atrial appendage with moderate continuous spontaneous echo contrast ("smoke") in the left atrial cavity. There was no evidence for right atrial thrombus. At that time, the patient refused life vest.  He has been on Xarelto for anticoagulation.He denies chest pain. He denies significant shortness of breath. He is unaware of tachdysrhythmias.   He has permanent atrial fibrillation, but his rate has been controlled.  He denies any presyncope or syncope.  He denies any chest pain.  A followup echo Doppler study on 06/14/2013 revealed significant improvement in LV function  with an ejection fraction estimated now at 50-55% without regional wall motion abnormalities.  He did have mild aortic root dilatation, mild aortic insufficiency, and mild mitral regurgitation with biatrial enlargement.   He has severe bilateral carotid artery disease.  He had been seen by Dr. Oneida Alar.  His prior carotid duplex scan was significantly abnormal with his left carotid velocities increased to 295 systolic and 188 diastolic with severe fibrous plaque and elevated velocities within all segments of his left internal carotid.  There also was 50-69% diameter reduction in the right bulb/proximal ICA. I referred him to Dr. Gwenlyn Found who subsequently referred him to Dr. Trula Slade.  He denies chest pain.  He denies palpitations.  He denies shortness of breath.  He continues to say he is asymptomatic with reference to his carotid disease and has refused intervention.  When I saw him  Last yehe denied any change in symptomatology.  A follow-up Doppler study on 08/23/2014 showed increased velocities in the left ar  492 over 148 at the MICA level and to 30/67, at the PICA level.  On the right, his PICA velocity was 292/78.  When compared to his prior study.  This has not increased and has remained fairly stable.  He denies chest pain or palpitations.  He denies paresthesias.  He denies visual symptoms.  When I saw him in follow-up,  he remained asymptomatic.  He has refused to consider any potential carotid intervention or surgery.  He was unaware of any fast heartbeats with his chronic atrial fibrillation.  He denies bleeding.  A carotid study earlier today continued to demonstrate severe stenosis with peak  velocity in the left MICA at 500/122 and PICA 241/34. On the right,PICA was 293/106.  At his last office visit, I recommended discontinuing of simvastatin in attempt to be more aggressive with lipid lowering and started Crestor 40 mg daily.  He was hospitalized May 2017  after sustaining a renal infarct with  acute kidney injury after taking 3 days off Xarelto for pain management injection to his back.  He had developed mild anemia with a hemoglobin drop of 3 points leading to a subsequent emergency room evaluation.  A CT of his abdomen and pelvis did not show acute findings or signs of peritoneal bleeding.  He denied any episodes of chest pain,  paresthesias, presyncope or syncope. Or bleeding  An echo Doppler study in May 2017 showed an EF of 55-60%.  There was mild aortic insufficiency, mild aortic root dilatation, mitral annular calcification with mild MR.   When I last saw him in the office, had begun to consider the possibility of maybe in the future thinking about carotid surgery, but he was not ready yet to have this done.  His last carotid study in September 2017 showed left carotid peak systolic velocity of 865 and peak diastolic velocity 784, consistent with a least 80% stenosis.  On the right peak systolic velocity was 696 and diastolic velocity was 85.  He is followed by Dr. Posey Pronto for his chronic kidney disease.  Since I last saw him in December 2017, he states that he feels well.  He specifically denies any episodes of chest pain or shortness of breath.  He denies any neurologic symptoms.  There are no paresthesias or episodes of dizziness.  He continues to be active working in his garden, riding his tractor, as well as significant additional moderate outdoor work.  and just had blood work done in his office.  I do not have these results.  He had been on carvedilol at 9.375 mm times twice a day, but apparently the past week has only been taking 3.125 mg twice a day, which was the dose that he was given when his prescription was renewed.  He is unaware of palpitations.  He denies significant swelling.  He continues to be on lisinopril 10 mg daily.  He has not had any bleeding on anticoagulation with eloquence at reduced dose of 2.5 twice a day for his permanent atrial fibrillation.  He continues to be on  rosuvastatin 40 mg for hyperlipidemia with target LDL less than 70 in attempt to induce plaque regression.  He still remains adamant against consideration of carotid surgery.  He presents for reevaluation.  Past Medical History:  Diagnosis Date  . Appendicitis with abscess 03/31/2011  . Arthritis   . CAD (coronary artery disease),CABG 1993-LIMA to the LAD, SVG to Om and SVG to PDA/PLA, patent on cath 2008. 2003   a. s/p CABG 2003. b. last cath 2008 with patent grafts.  . Carotid artery disease ([place])    a. Severe - pt refused intervention in the past.  . Chronic atrial fibrillation ([place])    permanent  . Chronic systolic CHF (congestive heart failure) ([place])    a. Prior low EF, later normalized.  . Essential hypertension   . GERD (gastroesophageal reflux disease)   . Heart attack ([place]) 1980's  . Heart murmur   . Hyperlipidemia   . Ischemic cardiomyopathy   . Kidney stones    "passed all but one time when he had to have lithotripsy" (01/21/2013)  . Pulmonary hypertension ([place])   .  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 07/2015 - after holding Xarelto x 3 days for spinal procedure.  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 SSS (sick sinus syndrome) ([place])   . Stroke [facility]) 2007   "slightly drags left foot since; recovered qthing else" (01/21/2013)  . Syncope 07/2015   a. felt vasovagal in setting of pain from renal infarct.    Past Surgical History:  Procedure Laterality Date  . BALLOON DILATION N/A 05/22/2015   Procedure: BALLOON DILATION;  Surgeon: Gatha Mayer, MD;  Location: [facility] ENDOSCOPY;  Service: Endoscopy;  Laterality: N/A;  . CARDIAC CATHETERIZATION  02/24/2002   reduced LV function, 60-70% prox RCA stenosis, 70% PLA ostial stenosis, 80% secondary branch of PLA stenosis - subsequent CABG (Dr. Jackie Plum)  . Carotid Doppler  06/2012   50-69% right bulb/prox ICA diameter reduction; 70-99% left bulb/prox ICA diameter reduction  . CATARACT EXTRACTION W/ INTRAOCULAR LENS IMPLANT Bilateral 2013  . COLONOSCOPY  N/A 05/18/2016   Procedure: COLONOSCOPY;  Surgeon: Manus Gunning, MD;  Location: [facility] ENDOSCOPY;  Service: Gastroenterology;  Laterality: N/A;  . CORONARY ANGIOPLASTY  07/05/2006   3 vessel CAD, patent LIMA to LAD, patentVG to OM, patent SVG to PDA & PLA, mild MR, severe LV systolic dysfunction (Dr. Gerrie Nordmann)  . CORONARY ARTERY BYPASS GRAFT  03/01/2002   LIMA to LAD, reverse SVG to OM, reverse SVG to PDA of RCA, reverse SVG to PLA of RCA, ligation of LA appendage (Dr. Servando Snare)  . ENTEROSCOPY N/A 05/20/2016   Procedure: ENTEROSCOPY;  Surgeon: Manus Gunning, MD;  Location: [place];  Service: Gastroenterology;  Laterality: N/A;  . ESOPHAGOGASTRODUODENOSCOPY  02/01/2012   Procedure: ESOPHAGOGASTRODUODENOSCOPY (EGD);  Surgeon: Beryle Beams, MD;  Location: [facility];  Service: Endoscopy;  Laterality: N/A;  . ESOPHAGOGASTRODUODENOSCOPY N/A 05/22/2015   Procedure: ESOPHAGOGASTRODUODENOSCOPY (EGD);  Surgeon: Gatha Mayer, MD;  Location: [facility]OS[facility];  Service: Endoscopy;  Laterality: N/A;  . ESOPHAGOGASTRODUODENOSCOPY N/A 05/17/2016   Procedure: ESOPHAGOGASTRODUODENOSCOPY (EGD);  Surgeon: Juanita Craver, MD;  Location: [facility] ENDOSCOPY;  Service: Endoscopy;  Laterality: N/A;  . GIVENS CAPSULE STUDY N/A 05/18/2016   Procedure: GIVENS CAPSULE STUDY;  Surgeon: Manus Gunning, MD;  Location: [place];  Service: Gastroenterology;  Laterality: N/A;  . LAPAROSCOPIC APPENDECTOMY  03/30/2011   Procedure: APPENDECTOMY LAPAROSCOPIC;  Surgeon: Joyice Faster. Cornett, MD;  Location: [place];  Service: General;  Laterality: N/A;  . LEFT HEART CATHETERIZATION WITH CORONARY ANGIOGRAM N/A 01/24/2013   Procedure: LEFT HEART CATHETERIZATION WITH CORONARY ANGIOGRAM;  Surgeon: [name], MD;  Location: [facility] CATH LAB;  Service: Cardiovascular;  Laterality: N/A;  Right heart with grafts  . LITHOTRIPSY     "once" (01/21/2013)  . NM MYOCAR PERF WALL MOTION  05/2009   persantine myoview - fixed moderate  perfusion defect in inferior wall & lateral segment of apex (poor non-transmural infarction), minimal anterolateral periinfarct reversible ischemia seen, abnormal study, defects similar to 2006 study  . SPLENECTOMY, TOTAL  01/2013  . TEE WITHOUT CARDIOVERSION N/A 01/23/2013   Procedure: TRANSESOPHAGEAL ECHOCARDIOGRAM (TEE);  Surgeon: Sanda Klein, MD;  Location: [facility] ENDOSCOPY;  Service: Cardiovascular;  Laterality: N/A;  . TRANSTHORACIC ECHOCARDIOGRAM  06/23/2012   EF 40-45%, mild LVH; mild AV regurg; mild MV regurg; LV mod-severely dilated; RV mildly dilated; systolic pressure borderline increased; RA mod dilated    Allergies  Allergen Reactions  . Percocet [Oxycodone-Acetaminophen] Itching and Nausea Only    Current Outpatient Prescriptions  Medication Sig Dispense Refill  . apixaban (  ELIQUIS) 2.5 MG TABS tablet Take 1 tablet (2.5 mg total) by mouth 2 (two) times daily. 60 tablet 11  . carvedilol (COREG) 3.125 MG tablet Take 1 tablet (3.125 mg total) by mouth 2 (two) times daily with a meal. 270 tablet 3  . furosemide (LASIX) 40 MG tablet TAKE 1/2 TABLET(20 MG) BY MOUTH DAILY 45 tablet 8  . lisinopril (PRINIVIL,ZESTRIL) 10 MG tablet Take 10 mg by mouth daily.    . pantoprazole (PROTONIX) 40 MG tablet TAKE 1 TABLET BY MOUTH EVERY DAY 90 tablet 3  . rOPINIRole (REQUIP) 0.25 MG tablet TAKE 1 TABLET(0.25 MG) BY MOUTH TWICE DAILY 180 tablet 3  . rosuvastatin (CRESTOR) 40 MG tablet TAKE 1 TABLET(40 MG) BY MOUTH DAILY 90 tablet 0  . traMADol (ULTRAM) 50 MG tablet TAKE ONE-HALF TABLET BY MOUTH EVERY 8 HOURS AS NEEDED FOR PAIN 30 tablet 1   No current facility-administered medications for this visit.     Social History   Social History  . Marital status: Married    Spouse name: N/A  . Number of children: 9  . Years of education: 8   Occupational History  . retired    Social History Main Topics  . Smoking status: Former Smoker    Packs/day: 2.00    Years: 20.00    Types: Cigarettes      Quit date: 04/08/1986  . Smokeless tobacco: Current User    Types: Chew  . Alcohol use No  . Drug use: No  . Sexual activity: Not on file   Other Topics Concern  . Not on file   Social History Narrative   A she is married, 5 sons 4 daughters. He is retired Scientist, water quality. 2-3 caffeinated beverages a day no alcohol    Family History  Problem Relation Age of Onset  . Heart disease Father        rheumatic fever  . Heart attack Brother 8  . Stroke Mother   . Stroke Brother 68   ROS General: Negative; No fevers, chills, or night sweats;  HEENT: Negative; No changes in vision or hearing, sinus congestion, difficulty swallowing Pulmonary: Negative; No cough, wheezing, shortness of breath, hemoptysis Cardiovascular: See history of present illness GI: positive for GERD; No nausea, vomiting, diarrhea, or abdominal pain GU: Renal infarct Musculoskeletal: Negative; no myalgias, joint pain, or weakness Hematologic/Oncology: Negative; no easy bruising, bleeding Endocrine: Negative; no heat/cold intolerance; no diabetes Neuro: positive for remote CVA Skin: Negative; No rashes or skin lesions Psychiatric: Negative; No behavioral problems, depression Sleep: Negative; No snoring, daytime sleepiness, hypersomnolence, bruxism, restless legs, hypnogognic hallucinations, no cataplexy Other comprehensive 14 point system review is negative.   PE BP (!) 118/56   Pulse 82   Ht 5' 6.5" (1.689 m)   Wt 184 lb 3.2 oz (83.6 kg)   BMI 29.29 kg/m    Repeat blood pressure was 116/64  Wt Readings from Last 3 Encounters:  09/22/16 184 lb 3.2 oz (83.6 kg)  07/03/16 188 lb (85.3 kg)  06/08/16 185 lb 3.2 oz (84 kg)   General: Alert, oriented, no distress.  Skin: normal turgor, no rashes, warm and dry; since I last saw him, he again is fully bearded.  HEENT: Normocephalic, atraumatic. Pupils equal round and reactive to light; sclera anicteric; extraocular muscles intact;  Nose without nasal septal  hypertrophy Mouth/Parynx benign; Mallinpatti scale 3 Neck: No JVD, no carotid bruits; normal carotid upstroke Lungs: clear to ausculatation and percussion; no wheezing or rales Chest wall: without  tenderness to palpitation Heart: PMI not displaced, irregularly irregular rhythm compatible with his atrial fibrillation with controlled ventricular response, s1 s2 normal, 2/6 systolic murmur, no diastolic murmur, no rubs, gallops, thrills, or heaves Abdomen: soft, nontender; no hepatosplenomehaly, BS+; abdominal aorta nontender and not dilated by palpation. Back: no CVA tenderness Pulses 2+ Musculoskeletal: full range of motion, normal strength, no joint deformities Extremities: no clubbing cyanosis or edema, Homan's sign negative  Neurologic: grossly nonfocal; Cranial nerves grossly wnl Psychologic: Normal mood and affect   ECG (independently read by me): Atrial fibrillation at 60 bpm.  Low voltage.  Probable old anterolateral MI with poor anterior R-wave progression.  Small inferior Q waves.  December 2017 ECG (independently read by me): Atrial fibrillation with a rate at 58 bpm.  QS complex anteriorly.  Low voltage.  September 2017 ECG (independently read by me): Atrial fibrillation with premature ventricular beats.  Poor R wave progression anteriorly.  Small nondiagnostic inferior Q waves.  December 2016 ECG (independently read by me): atrial fibrillation with rate in the 60s with occasional PVC.  QTc interval 452 ms  August 2016 ECG (independently read by me): Atrial fibrillation at 67 bpm.  2 ventricular premature beats.  Poor R-wave progression  May 2016 ECG (independently read by me): Atrial fibrillation with occasional PVCs.  Heart rate 72.  ECG (independently read by me): Atrial fibrillation with controlled ventricular response in the 70s.  Poor anterior R-wave progression.  Prior April 2015 ECG (independently read by me): Atrial fibrillation at 56 beats per minute.  QTc interval  395 ms  Prior 02/23/2013 ECG: Atrial fibrillation at 80 beats per minute with isolated PVC.  LABS: BMP Latest Ref Rng & Units 05/21/2016 05/20/2016 05/19/2016  Glucose 65 - 99 mg/dL 117(H) 101(H) 132(H)  BUN 6 - 20 mg/dL '10 11 14  '$ Creatinine 0.61 - 1.24 mg/dL 1.49(H) 1.45(H) 1.56(H)  Sodium 135 - 145 mmol/L 136 135 135  Potassium 3.5 - 5.1 mmol/L 3.8 3.8 3.6  Chloride 101 - 111 mmol/L 102 99(L) 103  CO2 22 - 32 mmol/L '28 28 24  '$ Calcium 8.9 - 10.3 mg/dL 8.0(L) 8.2(L) 8.0(L)   Hepatic Function Latest Ref Rng & Units 05/15/2016 05/15/2016 04/22/2016  Total Protein 6.5 - 8.1 g/dL 6.1(L) 6.2 7.4  Albumin 3.5 - 5.0 g/dL 2.9(L) 3.3(L) 3.6  AST 15 - 41 U/L '17 14 16  '$ ALT 17 - 63 U/L 10(L) 8(L) 10  Alk Phosphatase 38 - 126 U/L 59 63 72  Total Bilirubin 0.3 - 1.2 mg/dL 0.4 0.5 0.4  Bilirubin, Direct 0.0 - 0.3 mg/dL - - -   CBC Latest Ref Rng & Units 06/17/2016 05/27/2016 05/22/2016  WBC 4.0 - 10.5 K/uL 8.5 6.0 -  Hemoglobin 13.0 - 17.0 g/dL 11.3(L) 10.6(L) 8.7(L)  Hematocrit 39.0 - 52.0 % 33.2(L) 32.9(L) 27.1(L)  Platelets 150.0 - 400.0 K/uL 326.0 318.0 -   Lab Results  Component Value Date   MCV 86.3 06/17/2016   MCV 89.3 05/27/2016   MCV 86.9 05/21/2016   Lab Results  Component Value Date   TSH 3.18 05/15/2016   Lab Results  Component Value Date   HGBA1C 6.5 04/22/2016   Lipid Panel     Component Value Date/Time   CHOL 134 11/27/2015 0830   TRIG 97 11/27/2015 0830   HDL 48 11/27/2015 0830   CHOLHDL 2.8 11/27/2015 0830   VLDL 19 11/27/2015 0830   LDLCALC 67 11/27/2015 0830     IMPRESSION:  1. Coronary artery disease involving native  coronary artery of native heart without angina pectoris   2. Chronic a-fib ([place])   3. Bilateral carotid artery disease ([place])   4. Anticoagulation adequate   5. Renal insufficiency   6. Hyperlipidemia with target LDL less than 70   7. Old cerebrovascular accident (CVA) without late effect     ASSESSMENT AND PLAN: Gregory [name] is a 83 year old  gentleman who is status post CABG revascularization surgery in 2003. He has a history of an  ischemic cardiomyopathy with an initial ejection fraction of 25% with subsequent normalization at 55-60%.  He has a remote history of a small right brain CVA in 2005  with subsequent recovery. 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 He was documented to have extensive thrombus in his left atrium and is on anticoagulation therapy with Xarelto 20 mg daily which needs to be lifelong.  When his Xarelto was held for an injection for pain management to the back he sustained a renal infarct with transient acute kidney insufficiency.  He denies any awareness of heme in stool or bleeding.  Her is followed closely by Dr. Posey Pronto for his renal disease.  His lisinopril had been held and recently this has been restarted and titrated up to 10 mg. I reviewed his carotid duplex scan from last year, again revealed severe bilateral plaque.  Both carotids, left greater than right, but actually maximal loss of these were improved compared to previously with the maximum velocity on the left at 643 systolically and 329 diastolic lead.  He continues to be against any consideration for carotid procedure.  We discussed the importance of aggressive lipid-lowering therapy.  Target LDL is less than 70.  I will recheck fasting laboratory in the past.  He also was anemic.  Adjustments to his medical therapy will be made if necessary.  I will see him in 6 months for reevaluation.    Time spent: 25 minutes  Troy Sine, MD, [facility]  09/24/2016 4:02 PM

## 2016-09-22 NOTE — Patient Instructions (Addendum)
Your physician recommends that you return for fasting lab work here in our office. No appointment is needed.  Your physician wants you to follow-up in: 6 months or sooner if needed. You will receive a reminder letter in the mail two months in advance. If you don't receive a letter, please call our office to schedule the follow-up appointment.  If you need a refill on your cardiac medications before your next appointment, please call your pharmacy.  Your physician has requested that you have a carotid duplex in November.

## 2016-09-23 NOTE — Anesthesia Postprocedure Evaluation (Signed)
Anesthesia Post Note  Patient: Gregory [name]  Procedure(s) Performed: Procedure(s) (LRB): COLONOSCOPY (N/A) GIVENS CAPSULE STUDY (N/A)     Patient location during evaluation: PACU Anesthesia Type: MAC Level of consciousness: awake and alert Pain management: pain level controlled Vital Signs Assessment: post-procedure vital signs reviewed and stable Respiratory status: spontaneous breathing, nonlabored ventilation and respiratory function stable Cardiovascular status: stable and blood pressure returned to baseline Anesthetic complications: no    Last Vitals:  Vitals:   05/22/16 0529 05/22/16 1457  BP: (!) 153/63 (!) 126/55  Pulse: 63 63  Resp: 19 18  Temp: 36.9 C 37.1 C    Last Pain:  Vitals:   05/22/16 1457  TempSrc: Oral  PainSc:                  Raynard Mapps,W. EDMOND

## 2016-09-23 NOTE — Addendum Note (Signed)
Addendum  created 09/23/16 1546 by Roderic Palau, MD   Sign clinical note

## 2016-09-28 DIAGNOSIS — I482 Chronic atrial fibrillation: Secondary | ICD-10-CM | POA: Diagnosis not present

## 2016-09-28 DIAGNOSIS — I2510 Atherosclerotic heart disease of native coronary artery without angina pectoris: Secondary | ICD-10-CM | POA: Diagnosis not present

## 2016-09-29 LAB — COMPREHENSIVE METABOLIC PANEL
A/G RATIO: 1.4 (ref 1.2–2.2)
ALBUMIN: 3.8 g/dL (ref 3.5–4.7)
ALK PHOS: 93 IU/L (ref 39–117)
ALT: 15 IU/L (ref 0–44)
AST: 24 IU/L (ref 0–40)
BILIRUBIN TOTAL: 0.3 mg/dL (ref 0.0–1.2)
BUN / CREAT RATIO: 13 (ref 10–24)
BUN: 22 mg/dL (ref 8–27)
CHLORIDE: 104 mmol/L (ref 96–106)
CO2: 25 mmol/L (ref 20–29)
Calcium: 9.2 mg/dL (ref 8.6–10.2)
Creatinine, Ser: 1.65 mg/dL — ABNORMAL HIGH (ref 0.76–1.27)
GFR calc Af Amer: 44 mL/min/{1.73_m2} — ABNORMAL LOW (ref >59)
GFR calc non Af Amer: 38 mL/min/{1.73_m2} — ABNORMAL LOW (ref >59)
GLOBULIN, TOTAL: 2.7 g/dL (ref 1.5–4.5)
Glucose: 86 mg/dL (ref 65–99)
POTASSIUM: 4.8 mmol/L (ref 3.5–5.2)
SODIUM: 144 mmol/L (ref 134–144)
Total Protein: 6.5 g/dL (ref 6.0–8.5)

## 2016-09-29 LAB — CBC
HEMATOCRIT: 34.6 % — AB (ref 37.5–51.0)
HEMOGLOBIN: 11.9 g/dL — AB (ref 13.0–17.7)
MCH: 30.6 pg (ref 26.6–33.0)
MCHC: 34.4 g/dL (ref 31.5–35.7)
MCV: 89 fL (ref 79–97)
Platelets: 201 10*3/uL (ref 150–379)
RBC: 3.89 x10E6/uL — ABNORMAL LOW (ref 4.14–5.80)
RDW: 15 % (ref 12.3–15.4)
WBC: 7.1 10*3/uL (ref 3.4–10.8)

## 2016-09-29 LAB — LIPID PANEL
CHOLESTEROL TOTAL: 130 mg/dL (ref 100–199)
Chol/HDL Ratio: 3.1 ratio (ref 0.0–5.0)
HDL: 42 mg/dL (ref >39)
LDL CALC: 73 mg/dL (ref 0–99)
Triglycerides: 76 mg/dL (ref 0–149)
VLDL Cholesterol Cal: 15 mg/dL (ref 5–40)

## 2016-09-29 LAB — TSH: TSH: 4.97 u[IU]/mL — ABNORMAL HIGH (ref 0.450–4.500)

## 2016-10-14 ENCOUNTER — Ambulatory Visit (HOSPITAL_COMMUNITY)
Admission: RE | Admit: 2016-10-14 | Discharge: 2016-10-14 | Disposition: A | Discharge status: Home / Self Care | Payer: Medicare Other | Source: Ambulatory Visit | Attending: Cardiovascular Disease | Admitting: Cardiovascular Disease

## 2016-10-14 DIAGNOSIS — I739 Peripheral vascular disease, unspecified: Secondary | ICD-10-CM | POA: Diagnosis not present

## 2016-10-14 DIAGNOSIS — I6523 Occlusion and stenosis of bilateral carotid arteries: Secondary | ICD-10-CM | POA: Diagnosis not present

## 2016-10-14 DIAGNOSIS — Z8673 Personal history of transient ischemic attack (TIA), and cerebral infarction without residual deficits: Secondary | ICD-10-CM | POA: Diagnosis not present

## 2016-10-14 DIAGNOSIS — Z87891 Personal history of nicotine dependence: Secondary | ICD-10-CM | POA: Diagnosis not present

## 2016-10-14 DIAGNOSIS — I779 Disorder of arteries and arterioles, unspecified: Secondary | ICD-10-CM | POA: Diagnosis not present

## 2016-10-14 DIAGNOSIS — I2510 Atherosclerotic heart disease of native coronary artery without angina pectoris: Secondary | ICD-10-CM | POA: Insufficient documentation

## 2016-10-14 DIAGNOSIS — Z951 Presence of aortocoronary bypass graft: Secondary | ICD-10-CM | POA: Diagnosis not present

## 2016-10-19 ENCOUNTER — Other Ambulatory Visit: Payer: Self-pay | Admitting: Family Medicine

## 2016-10-19 MED ORDER — TRAMADOL HCL 50 MG PO TABS: ORAL_TABLET | ORAL | 3 refills | Status: DC

## 2016-10-19 NOTE — Telephone Encounter (Signed)
Relation to BZ:[id number] Call back number:[phone]   Reason for call:  Patient requesting a traMADol (ULTRAM) 50 MG tablet refill,please advise

## 2016-10-19 NOTE — Telephone Encounter (Signed)
NCCSR: last filled 30 tramadol on 5/25 No unexpected entries Will refill with RF

## 2016-10-21 ENCOUNTER — Encounter: Payer: Self-pay | Admitting: *Deleted

## 2016-11-04 ENCOUNTER — Telehealth: Payer: Self-pay | Admitting: Cardiovascular Disease

## 2016-11-04 DIAGNOSIS — I6523 Occlusion and stenosis of bilateral carotid arteries: Secondary | ICD-10-CM

## 2016-11-04 DIAGNOSIS — I739 Peripheral vascular disease, unspecified: Principal | ICD-10-CM

## 2016-11-04 DIAGNOSIS — I779 Disorder of arteries and arterioles, unspecified: Secondary | ICD-10-CM

## 2016-11-05 NOTE — Telephone Encounter (Signed)
Routed for FYI

## 2016-11-05 NOTE — Telephone Encounter (Signed)
F/u message  Pt wife returning call.please call back to discuss

## 2016-11-05 NOTE — Telephone Encounter (Signed)
Spoke to wife as well as patient and daughter. We reviewed carotid US results, which Hayley and Mariann Laster had tried to contact them about previously.  Patient had been in the past reluctant to go for revascularization of left carotid artery, but recent test showed worsening blockage (indicated by velocity changes as compared to 2017.)  Almyra Deforest PA assisted in interpretation of results and explaining significance of findings.  Pt voiced agreement to return to see Dr. Oneida Alar for consultation. Since he has been several years w/o seeing Dr. Oneida Alar, I have placed amb referral order. Pt and fam aware a scheduler will contact them to set up appt, and to call if new concerns in interim.

## 2016-11-11 ENCOUNTER — Other Ambulatory Visit: Payer: Self-pay

## 2016-11-11 DIAGNOSIS — I6523 Occlusion and stenosis of bilateral carotid arteries: Secondary | ICD-10-CM

## 2016-12-06 NOTE — Progress Notes (Signed)
[facility] [address] [place], [place] [address] [phone]  Date:  12/09/2016   Name:  Gregory [name]   DOB:  03-13-1933   MRN:  [id number]  PCP:  Darreld Mclean, MD    Chief Complaint: Follow-up   History of Present Illness:  Gregory [name] is a 83 y.o. very pleasant male patient who presents with the following:  Last seen by myself in March following a GI bleed He is a cardiology pt and saw Dr. Claiborne Billings in July, had labs which I reviewed in Epic History of a fib, eliquis used for anticoagulation, CRI, CAD, CVA Needs a flu shot- done today  He is taking tramadol as needed for his back and joint pains.  He is no longer able to go off the eliquis to get shots in his back, so he is depending on the tramadol for pain  He is taking tramadol 1/2 pill TID as needed "as long as I don't do anything I don't have any pain." however he is still quite active and was laying a rock fire pit this morning   He does see his nephrologist in December of this year- they are seeing him once a year.  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  I do not have these records but no recent major wound  BP Readings from Last 3 Encounters:  12/09/16 109/61  09/22/16 (!) 118/56  07/03/16 (!) 104/52   He has not noted any GERD sx or other stomach issues  "I never had heartburn in my life, I never had a headache in my life except when I used to drink 50 years ago."   Wt Readings from Last 3 Encounters:  12/09/16 185 lb 6.4 oz (84.1 kg)  09/22/16 184 lb 3.2 oz (83.6 kg)  07/03/16 188 lb (85.3 kg)   Holding his weight stable He is still very active  Patient Active Problem List   Diagnosis Date Noted  . Symptomatic anemia   . Chronic anemia 01/13/2016  . Chronic renal failure, stage 3 (moderate) 08/06/2015  . Vasovagal  syncope   . Coronary artery disease involving native coronary artery   . 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 Syncope 07/30/2015  . Esophageal stricture   . Carotid stenosis 07/13/2013  . Anticoagulated on Eliquis 03/21/2013  . PVD - 05% LICA, 39% RICA by doppler 5/14 01/24/2013  . 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 CAD (coronary artery disease),CABG 1993-LIMA to the LAD, SVG to Om and SVG to PDA/PLA, patent on cath 2014. 03/31/2011  . Chronic a-fib ([place]) 03/31/2011  . Cardiomyopathy, ischemic, improved 03/31/2011  . CVA (cerebral vascular accident) ([place]) 03/31/2011  . GERD (gastroesophageal reflux disease) 03/31/2011  . Renal calculi 03/31/2011  . History of tobacco use, continues with chewing tobacco 03/31/2011    Past Medical History:  Diagnosis Date  . Appendicitis with abscess 03/31/2011  . Arthritis   . CAD (coronary artery disease),CABG 1993-LIMA to the LAD, SVG to Om and SVG to PDA/PLA, patent on cath 2008. 2003   a. s/p CABG 2003. b. last cath 2008 with patent grafts.  . Carotid artery disease ([place])    a. Severe - pt refused intervention in the past.  . Chronic  atrial fibrillation (HCC)    permanent  . Chronic systolic CHF (congestive heart failure) ([place])    a. Prior low EF, later normalized.  . Essential hypertension   . GERD (gastroesophageal reflux disease)   . Heart attack ([place]) 1980's  . Heart murmur   . Hyperlipidemia   . Ischemic cardiomyopathy   . Kidney stones    "passed all but one time when he had to have lithotripsy" (01/21/2013)  . Pulmonary hypertension ([place])   . Renal infarct ([place])    a. 07/2015 - after holding Xarelto x 3 days for spinal procedure.  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 SSS (sick sinus syndrome) ([place])   . Stroke [facility]) 2007   "slightly drags left foot since; recovered qthing else" (01/21/2013)  . Syncope 07/2015   a. felt vasovagal in setting of pain from renal infarct.    Past  Surgical History:  Procedure Laterality Date  . BALLOON DILATION N/A 05/22/2015   Procedure: BALLOON DILATION;  Surgeon: Gatha Mayer, MD;  Location: [facility] ENDOSCOPY;  Service: Endoscopy;  Laterality: N/A;  . CARDIAC CATHETERIZATION  02/24/2002   reduced LV function, 60-70% prox RCA stenosis, 70% PLA ostial stenosis, 80% secondary branch of PLA stenosis - subsequent CABG (Dr. Jackie Plum)  . Carotid Doppler  06/2012   50-69% right bulb/prox ICA diameter reduction; 70-99% left bulb/prox ICA diameter reduction  . CATARACT EXTRACTION W/ INTRAOCULAR LENS IMPLANT Bilateral 2013  . COLONOSCOPY N/A 05/18/2016   Procedure: COLONOSCOPY;  Surgeon: Manus Gunning, MD;  Location[facility]COPY;  Service: Gastroenterology;  Laterality: N/A;  . CORONARY ANGIOPLASTY  07/05/2006   3 vessel CAD, patent LIMA to LAD, patentVG to OM, patent SVG to PDA & PLA, mild MR, severe LV systolic dysfunction (Dr. Gerrie Nordmann)  . CORONARY ARTERY BYPASS GRAFT  03/01/2002   LIMA to LAD, reverse SVG to OM, reverse SVG to PDA of RCA, reverse SVG to PLA of RCA, ligation of LA appendage (Dr. Servando Snare)  . ENTEROSCOPY N/A 05/20/2016   Procedure: ENTEROSCOPY;  Surgeon: Manus Gunning, MD;  Location: [facility];  Service: Gastroenterology;  Laterality: N/A;  . ESOPHAGOGASTRODUODENOSCOPY  02/01/2012   Procedure: ESOPHAGOGASTRODUODENOSCOPY (EGD);  Surgeon: Beryle Beams, MD;  Location[facility];  Service: Endoscopy;  Laterality: N/A;  . ESOPHAGOGASTRODUODENOSCOPY N/A 05/22/2015   Procedure: ESOPHAGOGASTRODUODENOSCOPY (EGD);  Surgeon: Gatha Mayer, MD;  Location: [facility];  Service: Endoscopy;  Laterality: N/A;  . ESOPHAGOGASTRODUODENOSCOPY N/A 05/17/2016   Procedure: ESOPHAGOGASTRODUODENOSCOPY (EGD);  Surgeon: Juanita Craver, MD;  Location[facility] ENDOSCOPY;  Service: Endoscopy;  Laterality: N/A;  . GIVENS CAPSULE STUDY N/A 05/18/2016   Procedure: GIVENS CAPSULE STUDY;  Surgeon: Manus Gunning, MD;  Location: [facility];   Service: Gastroenterology;  Laterality: N/A;  . LAPAROSCOPIC APPENDECTOMY  03/30/2011   Procedure: APPENDECTOMY LAPAROSCOPIC;  Surgeon: Joyice Faster. Cornett, MD;  Location: [facility];  Service: General;  Laterality: N/A;  . LEFT HEART CATHETERIZATION WITH CORONARY ANGIOGRAM N/A 01/24/2013   Procedure: LEFT HEART CATHETERIZATION WITH CORONARY ANGIOGRAM;  Surgeon: [name], MD;  Location: [facility] CATH LAB;  Service: Cardiovascular;  Laterality: N/A;  Right heart with grafts  . LITHOTRIPSY     "once" (01/21/2013)  . NM MYOCAR PERF WALL MOTION  05/2009   persantine myoview - fixed moderate perfusion defect in inferior wall & lateral segment of apex (poor non-transmural infarction), minimal anterolateral periinfarct reversible ischemia seen, abnormal study, defects similar to 2006 study  . SPLENECTOMY, TOTAL  01/2013  . TEE WITHOUT  CARDIOVERSION N/A 01/23/2013   Procedure: TRANSESOPHAGEAL ECHOCARDIOGRAM (TEE);  Surgeon: Sanda Klein, MD;  Location: [facility] ENDOSCOPY;  Service: Cardiovascular;  Laterality: N/A;  . TRANSTHORACIC ECHOCARDIOGRAM  06/23/2012   EF 40-45%, mild LVH; mild AV regurg; mild MV regurg; LV mod-severely dilated; RV mildly dilated; systolic pressure borderline increased; RA mod dilated    Social History  Substance Use Topics  . Smoking status: Former Smoker    Packs/day: 2.00    Years: 20.00    Types: Cigarettes    Quit date: 04/08/1986  . Smokeless tobacco: Current User    Types: Chew  . Alcohol use No    Family History  Problem Relation Age of Onset  . Heart disease Father        rheumatic fever  . Heart attack Brother 23  . Stroke Mother   . Stroke Brother 63    Allergies  Allergen Reactions  . Percocet [Oxycodone-Acetaminophen] Itching and Nausea Only    Medication list has been reviewed and updated.  Current Outpatient Prescriptions on File Prior to Visit  Medication Sig Dispense Refill  . apixaban (ELIQUIS) 2.5 MG TABS tablet Take 1 tablet (2.5 mg total) by mouth  2 (two) times daily. 60 tablet 11  . carvedilol (COREG) 3.125 MG tablet Take 1 tablet (3.125 mg total) by mouth 2 (two) times daily with a meal. 270 tablet 3  . furosemide (LASIX) 40 MG tablet TAKE 1/2 TABLET(20 MG) BY MOUTH DAILY 45 tablet 8  . lisinopril (PRINIVIL,ZESTRIL) 10 MG tablet Take 10 mg by mouth daily.    . pantoprazole (PROTONIX) 40 MG tablet TAKE 1 TABLET BY MOUTH EVERY DAY 90 tablet 3  . rOPINIRole (REQUIP) 0.25 MG tablet TAKE 1 TABLET(0.25 MG) BY MOUTH TWICE DAILY 180 tablet 3  . rosuvastatin (CRESTOR) 40 MG tablet TAKE 1 TABLET(40 MG) BY MOUTH DAILY 90 tablet 0  . traMADol (ULTRAM) 50 MG tablet TAKE ONE-HALF TABLET BY MOUTH EVERY 8 HOURS AS NEEDED FOR PAIN 30 tablet 3   No current facility-administered medications on file prior to visit.     Review of Systems:  As per HPI- otherwise negative. No fever or chills No CP or SOB No belly pain   Physical Examination: Vitals:   12/09/16 1316  BP: 109/61  Pulse: 67  Temp: 98.2 F (36.8 C)  SpO2: 96%   Vitals:   12/09/16 1316  Weight: 185 lb 6.4 oz (84.1 kg)  Height: 5' 6.5" (1.689 m)   Body mass index is 29.48 kg/m. Ideal Body Weight: Weight in (lb) to have BMI = 25: 156.9  GEN: WDWN, NAD, Non-toxic, A & O x 3, looks well, elderly, here today with his daughter  HEENT: Atraumatic, Normocephalic. Neck supple. No masses, No LAD.  Bilateral TM wnl, oropharynx normal.  PEERL,EOMI.   Ears and Nose: No external deformity. CV: rate controlled a fib, No M/G/R. No JVD. No thrill. No extra heart sounds. PULM: CTA B, no wheezes, crackles, rhonchi. No retractions. No resp. distress. No accessory muscle use. ABD: S, NT, ND EXTR: No c/c/e NEURO Normal gait.  PSYCH: Normally interactive. Conversant. Not depressed or anxious appearing.  Calm demeanor.    Assessment and Plan: Bilateral hip pain  Need for influenza vaccination - Plan: Flu vaccine HIGH DOSE PF, CANCELED: Flu vaccine HIGH DOSE PF (Fluzone High  dose)  Persistent atrial fibrillation (HCC)  Chronic kidney disease, unspecified CKD stage  Flu shot given today He is in persistent a fib, on eliquis and followed by cardiology  Regular visits with nephrology, now just annually.  Renal function is stable He has hip, back pain.  He uses tramadol as needed- up to 30 per month. He does not need to increase his rx at this time Follow-up in 6 months Commended his daughter on the fine job she is doing taking care of her mom and dad   Signed Lamar Blinks, MD

## 2016-12-09 ENCOUNTER — Ambulatory Visit (INDEPENDENT_AMBULATORY_CARE_PROVIDER_SITE_OTHER): Payer: Medicare Other | Admitting: Family Medicine

## 2016-12-09 ENCOUNTER — Encounter: Payer: Self-pay | Admitting: Family Medicine

## 2016-12-09 VITALS — BP 109/61 | HR 67 | Temp 98.2°F | Ht 66.5 in | Wt 185.4 lb

## 2016-12-09 DIAGNOSIS — Z23 Encounter for immunization: Secondary | ICD-10-CM

## 2016-12-09 DIAGNOSIS — M25552 Pain in left hip: Secondary | ICD-10-CM

## 2016-12-09 DIAGNOSIS — N189 Chronic kidney disease, unspecified: Secondary | ICD-10-CM | POA: Diagnosis not present

## 2016-12-09 DIAGNOSIS — M25551 Pain in right hip: Secondary | ICD-10-CM | POA: Diagnosis not present

## 2016-12-09 DIAGNOSIS — I4819 Other persistent atrial fibrillation: Secondary | ICD-10-CM

## 2016-12-09 DIAGNOSIS — I481 Persistent atrial fibrillation: Secondary | ICD-10-CM

## 2016-12-09 NOTE — Patient Instructions (Signed)
It was a pleasure to see you again today Continue to use your tramadol as needed for your back and joint pain  Please plan to see me in about 6 months.  You are looking great!

## 2016-12-20 ENCOUNTER — Other Ambulatory Visit: Payer: Self-pay | Admitting: Cardiovascular Disease

## 2016-12-22 ENCOUNTER — Other Ambulatory Visit: Payer: Self-pay | Admitting: Cardiovascular Disease

## 2016-12-24 ENCOUNTER — Encounter (HOSPITAL_COMMUNITY): Payer: Medicare Other

## 2016-12-24 ENCOUNTER — Encounter: Payer: Medicare Other | Admitting: Vascular Surgery

## 2017-01-08 ENCOUNTER — Other Ambulatory Visit: Payer: Self-pay | Admitting: Cardiovascular Disease

## 2017-01-08 NOTE — Telephone Encounter (Signed)
New message     *STAT* If patient is at the pharmacy, call can be transferred to refill team.   1. Which medications need to be refilled? (please list name of each medication and dose if known) rosuvastatin (CRESTOR) 40 MG tablet  2. Which pharmacy/location (including street and city if local pharmacy) is medication to be sent to?[facility]  3. Do they need a 30 day or 90 day supply? [place]

## 2017-01-14 ENCOUNTER — Ambulatory Visit (HOSPITAL_COMMUNITY)
Admission: RE | Admit: 2017-01-14 | Discharge: 2017-01-14 | Disposition: A | Discharge status: Home / Self Care | Payer: Medicare Other | Source: Ambulatory Visit | Attending: Vascular Surgery | Admitting: Vascular Surgery

## 2017-01-14 ENCOUNTER — Ambulatory Visit (INDEPENDENT_AMBULATORY_CARE_PROVIDER_SITE_OTHER): Payer: Medicare Other | Admitting: Vascular Surgery

## 2017-01-14 ENCOUNTER — Encounter: Payer: Self-pay | Admitting: Vascular Surgery

## 2017-01-14 VITALS — BP 148/62 | HR 63 | Temp 97.0°F | Resp 20 | Ht 66.5 in | Wt 183.0 lb

## 2017-01-14 DIAGNOSIS — I6523 Occlusion and stenosis of bilateral carotid arteries: Secondary | ICD-10-CM | POA: Diagnosis not present

## 2017-01-14 LAB — VAS US CAROTID
LCCADSYS: 56 cm/s
LEFT ECA DIAS: -25 cm/s
LEFT VERTEBRAL DIAS: 33 cm/s
LICADSYS: -92 cm/s
Left CCA dist dias: 23 cm/s
Left CCA prox dias: 26 cm/s
Left CCA prox sys: 86 cm/s
Left ICA dist dias: -44 cm/s
Left ICA prox dias: -246 cm/s
Left ICA prox sys: -553 cm/s
RCCADSYS: -72 cm/s
RCCAPDIAS: 20 cm/s
RIGHT CCA MID DIAS: 31 cm/s
RIGHT ECA DIAS: -20 cm/s
RIGHT VERTEBRAL DIAS: 29 cm/s
Right CCA prox sys: 71 cm/s

## 2017-01-14 NOTE — Progress Notes (Signed)
History of Present Illness:  Patient is a 83 y.o. year old male who presents for evaluation of carotid stenosis.  Patient has had a known carotid stenosis that has been followed with serial surveillance duplex ultrasound by Dr. Rollene Fare. I originally evaluated him in 2014 for this known carotid stenosis. At that point he refused operation because he had had a friend who died from carotid endarterectomy. The patient denies symptoms of TIA, amaurosis, or stroke.   The patient did have a stroke approximate 10 years ago with left-sided weakness. He fully recovered from this.   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 Other medical problems include coronary artery disease in the other medical problems mentioned above.  These are currently stable and followed by Dr. Claiborne Billings.  He is very active overall and can climb 2 flights of stairs without becoming short of breath. He does several outdoor activities around his house including all of his yard maintenance work and gardening.  He has a history of atrial fibrillation.  He is on Eliquis for this. When coming off Eliquis in the past he had a renal infarct and a splenic infarct. This was on 2 separate occasions.   Past Medical History:  Diagnosis Date  . Appendicitis with abscess 03/31/2011  . Arthritis   . CAD (coronary artery disease),CABG 1993-LIMA to the LAD, SVG to Om and SVG to PDA/PLA, patent on cath 2008. 2003   a. s/p CABG 2003. b. last cath 2008 with patent grafts.  . Carotid artery disease ([place])    a. Severe - pt refused intervention in the past.  . Chronic atrial fibrillation ([place])    permanent  . Chronic systolic CHF (congestive heart failure) ([place])    a. Prior low EF, later normalized.  . Essential hypertension   . GERD (gastroesophageal reflux disease)   . Heart attack ([place]) 1980's  . Heart murmur   . Hyperlipidemia   . Ischemic cardiomyopathy   . Kidney stones    "passed all but one time when he had to have lithotripsy"  (01/21/2013)  . Pulmonary hypertension ([place])   . Renal infarct ([place])    a. 07/2015 - after holding Xarelto x 3 days for spinal procedure.  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 SSS (sick sinus syndrome) ([place])   . Stroke [facility]) 2007   "slightly drags left foot since; recovered qthing else" (01/21/2013)  . Syncope 07/2015   a. felt vasovagal in setting of pain from renal infarct.    Past Surgical History:  Procedure Laterality Date  . BALLOON DILATION N/A 05/22/2015   Procedure: BALLOON DILATION;  Surgeon: Gatha Mayer, MD;  [facility]: [facility] ENDOSCOPY;  Service: Endoscopy;  Laterality: N/A;  . CARDIAC CATHETERIZATION  02/24/2002   reduced LV function, 60-70% prox RCA stenosis, 70% PLA ostial stenosis, 80% secondary branch of PLA stenosis - subsequent CABG (Dr. Jackie Plum)  . Carotid Doppler  06/2012   50-69% right bulb/prox ICA diameter reduction; 70-99% left bulb/prox ICA diameter reduction  . CATARACT EXTRACTION W/ INTRAOCULAR LENS IMPLANT Bilateral 2013  . COLONOSCOPY N/A 05/18/2016   Procedure: COLONOSCOPY;  Surgeon: Manus Gunning, MD;  Location: [facility] ENDOSCOPY;  Service: Gastroenterology;  Laterality: N/A;  . CORONARY ANGIOPLASTY  07/05/2006   3 vessel CAD, patent LIMA to LAD, patentVG to OM, patent SVG to PDA & PLA, mild MR, severe LV systolic dysfunction (Dr. Gerrie Nordmann)  . CORONARY ARTERY BYPASS GRAFT  03/01/2002   LIMA to LAD, reverse SVG to OM,  reverse SVG to PDA of RCA, reverse SVG to PLA of RCA, ligation of LA appendage (Dr. Servando Snare)  . ENTEROSCOPY N/A 05/20/2016   Procedure: ENTEROSCOPY;  Surgeon: Manus Gunning, MD;  Location: [facility];  Service: Gastroenterology;  Laterality: N/A;  . ESOPHAGOGASTRODUODENOSCOPY  02/01/2012   Procedure: ESOPHAGOGASTRODUODENOSCOPY (EGD);  Surgeon: Beryle Beams, MD;  Location[facility];  Service: Endoscopy;  Laterality: N/A;  . ESOPHAGOGASTRODUODENOSCOPY N/A 05/22/2015   Procedure: ESOPHAGOGASTRODUODENOSCOPY (EGD);  Surgeon: Gatha Mayer,  MD;  Location: [facility];  Service: Endoscopy;  Laterality: N/A;  . ESOPHAGOGASTRODUODENOSCOPY N/A 05/17/2016   Procedure: ESOPHAGOGASTRODUODENOSCOPY (EGD);  Surgeon: Juanita Craver, MD;  Location: [facility] ENDOSCOPY;  Service: Endoscopy;  Laterality: N/A;  . GIVENS CAPSULE STUDY N/A 05/18/2016   Procedure: GIVENS CAPSULE STUDY;  Surgeon: Manus Gunning, MD;  Location: [facility];  Service: Gastroenterology;  Laterality: N/A;  . LAPAROSCOPIC APPENDECTOMY  03/30/2011   Procedure: APPENDECTOMY LAPAROSCOPIC;  Surgeon: Joyice Faster. Cornett, MD;  Location: [facility];  Service: General;  Laterality: N/A;  . LEFT HEART CATHETERIZATION WITH CORONARY ANGIOGRAM N/A 01/24/2013   Procedure: LEFT HEART CATHETERIZATION WITH CORONARY ANGIOGRAM;  Surgeon: [name], MD;  Location: [facility] CATH LAB;  Service: Cardiovascular;  Laterality: N/A;  Right heart with grafts  . LITHOTRIPSY     "once" (01/21/2013)  . NM MYOCAR PERF WALL MOTION  05/2009   persantine myoview - fixed moderate perfusion defect in inferior wall & lateral segment of apex (poor non-transmural infarction), minimal anterolateral periinfarct reversible ischemia seen, abnormal study, defects similar to 2006 study  . SPLENECTOMY, TOTAL  01/2013  . TEE WITHOUT CARDIOVERSION N/A 01/23/2013   Procedure: TRANSESOPHAGEAL ECHOCARDIOGRAM (TEE);  Surgeon: Sanda Klein, MD;  Location: [facility] ENDOSCOPY;  Service: Cardiovascular;  Laterality: N/A;  . TRANSTHORACIC ECHOCARDIOGRAM  06/23/2012   EF 40-45%, mild LVH; mild AV regurg; mild MV regurg; LV mod-severely dilated; RV mildly dilated; systolic pressure borderline increased; RA mod dilated     Social History      History  Substance Use Topics  . Smoking status: Former Smoker      Quit date: 04/08/1986  . Smokeless tobacco: Current User      Types: Chew  . Alcohol Use: No      Family History      Family History  Problem Relation Age of Onset  . Heart disease Father    . Heart attack Brother    . Heart  disease Daughter        Allergies       Allergies  Allergen Reactions  . Percocet (Oxycodone-Acetaminophen) Itching and Nausea Only        Current Outpatient Prescriptions on File Prior to Visit  Medication Sig Dispense Refill  . apixaban (ELIQUIS) 2.5 MG TABS tablet Take 1 tablet (2.5 mg total) by mouth 2 (two) times daily. 60 tablet 11  . carvedilol (COREG) 3.125 MG tablet TAKE 1 TABLET(3.125 MG) BY MOUTH TWICE DAILY WITH A MEAL 180 tablet 0  . furosemide (LASIX) 40 MG tablet TAKE 1/2 TABLET(20 MG) BY MOUTH DAILY 45 tablet 8  . lisinopril (PRINIVIL,ZESTRIL) 10 MG tablet Take 10 mg by mouth daily.    . pantoprazole (PROTONIX) 40 MG tablet TAKE 1 TABLET BY MOUTH EVERY DAY 90 tablet 3  . rOPINIRole (REQUIP) 0.25 MG tablet TAKE 1 TABLET(0.25 MG) BY MOUTH TWICE DAILY 180 tablet 3  . rosuvastatin (CRESTOR) 40 MG tablet TAKE 1 TABLET(40 MG) BY MOUTH DAILY 90 tablet 0  . traMADol (ULTRAM)  50 MG tablet TAKE ONE-HALF TABLET BY MOUTH EVERY 8 HOURS AS NEEDED FOR PAIN 30 tablet 3   No current facility-administered medications on file prior to visit.      ROS:    General:  No weight loss, Fever, chills   HEENT: No recent headaches, no nasal bleeding, no visual changes, no sore throat   Neurologic: No dizziness, blackouts, seizures. No recent symptoms of stroke or mini- stroke. No recent episodes of slurred speech, or temporary blindness.   Cardiac: No recent episodes of chest pain/pressure, no shortness of breath at rest.  No shortness of breath with exertion.  Denies history of atrial fibrillation or irregular heartbeat   Vascular: No history of rest pain in feet.  No history of claudication.  No history of non-healing ulcer, No history of DVT    Pulmonary: No home oxygen, no productive cough, no hemoptysis,  No asthma or wheezing   Musculoskeletal:  [ ]  Arthritis, [ ]  Low back pain,  [ ]  Joint pain   Hematologic:No history of hypercoagulable state.  No history of easy bleeding.  No  history of anemia   Gastrointestinal: No hematochezia or melena,  No gastroesophageal reflux, no trouble swallowing   Urinary: [ ]  chronic Kidney disease, [ ]  on HD - [ ]  MWF or [ ]  TTHS, [ ]  Burning with urination, [ ]  Frequent urination, [ ]  Difficulty urinating;    Skin: No rashes   Psychological: No history of anxiety,  No history of depression     Physical Examination    Vitals:   01/14/17 1546 01/14/17 1548  BP: (!) 142/71 (!) 148/62  Pulse: 63   Resp: 20   Temp: (!) 97 F (36.1 C)   TempSrc: Oral   SpO2: 99%   Weight: 183 lb (83 kg)   Height: 5' 6.5" (1.689 m)      General:  Alert and oriented, no acute distress HEENT: Normal Neck: Bilateral carotid bruits left greater than right no JVD Pulmonary: Clear to auscultation bilaterally Cardiac: Regular Rate and Rhythm without murmur Gastrointestinal: Soft, non-tender, non-distended, no mass Skin: No rash Extremity Pulses:  2+ radial, brachial Musculoskeletal: No deformity or edema          Neurologic: Upper and lower extremity motor 5/5 and symmetric   DATA: Repeated the patient's carotid duplex exam today for operative planning purposes and to obtain anatomic detail of the level of stenosis. Patient was confirmed to have a greater than 80% left internal carotid artery stenosis this the normal anatomic location for a bifurcation with normal vessel beyond the level of stenosis. I reviewed and interpreted this study.     ASSESSMENT: High-grade asymptomatic left internal carotid artery stenosis with moderate right internal carotid artery stenosis also asymptomatic. Had lengthy discussion with the patient today as well as his family. I recommended to him left carotid endarterectomy for stroke prophylaxis. I also discussed with him the benefits of carotid endarterectomy versus medical management of his carotid stenosis in the risk of stroke reduction of surgery compared to medical therapy. However, at this point the patient has  opted for continued medical management and carotid surveillance. He will have a repeat carotid duplex exam in 6 months time. If he changes his mind we could consider carotid endarterectomy any time in the near future.     PLAN:  See above     Ruta Hinds, MD [facility] Office: [phone] Pager: [phone]

## 2017-01-28 NOTE — Addendum Note (Signed)
Addended by: Lianne Cure A on: 01/28/2017 10:42 AM   Modules accepted: Orders

## 2017-02-02 ENCOUNTER — Other Ambulatory Visit: Payer: Self-pay | Admitting: Family Medicine

## 2017-02-02 ENCOUNTER — Other Ambulatory Visit: Payer: Self-pay | Admitting: Cardiovascular Disease

## 2017-03-17 DIAGNOSIS — N2581 Secondary hyperparathyroidism of renal origin: Secondary | ICD-10-CM | POA: Diagnosis not present

## 2017-03-17 DIAGNOSIS — D631 Anemia in chronic kidney disease: Secondary | ICD-10-CM | POA: Diagnosis not present

## 2017-03-17 DIAGNOSIS — N183 Chronic kidney disease, stage 3 (moderate): Secondary | ICD-10-CM | POA: Diagnosis not present

## 2017-03-22 DIAGNOSIS — D631 Anemia in chronic kidney disease: Secondary | ICD-10-CM | POA: Diagnosis not present

## 2017-03-22 DIAGNOSIS — N183 Chronic kidney disease, stage 3 (moderate): Secondary | ICD-10-CM | POA: Diagnosis not present

## 2017-03-22 DIAGNOSIS — I129 Hypertensive chronic kidney disease with stage 1 through stage 4 chronic kidney disease, or unspecified chronic kidney disease: Secondary | ICD-10-CM | POA: Diagnosis not present

## 2017-03-22 DIAGNOSIS — N2581 Secondary hyperparathyroidism of renal origin: Secondary | ICD-10-CM | POA: Diagnosis not present

## 2017-04-06 ENCOUNTER — Other Ambulatory Visit: Payer: Self-pay | Admitting: Cardiovascular Disease

## 2017-04-27 ENCOUNTER — Telehealth: Payer: Self-pay | Admitting: Family Medicine

## 2017-04-27 ENCOUNTER — Other Ambulatory Visit: Payer: Self-pay | Admitting: Family Medicine

## 2017-04-27 NOTE — Telephone Encounter (Signed)
Copied from [place]. Topic: Quick Communication - Rx Refill/Question >> Apr 27, 2017  3:03 PM Cecelia Byars, NT wrote: Medication: tramadol 50 mg Has the patient contacted their pharmacy? {yes  (Agent: If no, request that the patient contact the pharmacy for the refill.) Preferred Pharmacy (with phone number or street name):[facility] on [address]  [phone] Agent: Please be advised that RX refills may take up to 3 business days. We ask that you follow-up with your pharmacy.

## 2017-04-27 NOTE — Telephone Encounter (Signed)
Tramadol refill Last Refill:10/19/2016 Pharmacy: [facility] on [address] [facility] [address]

## 2017-04-28 MED ORDER — TRAMADOL HCL 50 MG PO TABS: ORAL_TABLET | ORAL | 3 refills | Status: DC

## 2017-04-28 NOTE — Telephone Encounter (Signed)
Pt requesting refill on tramadol

## 2017-04-28 NOTE — Telephone Encounter (Signed)
Reviewed [place]- ok for refill today

## 2017-05-23 ENCOUNTER — Other Ambulatory Visit: Payer: Self-pay | Admitting: Cardiovascular Disease

## 2017-05-23 ENCOUNTER — Other Ambulatory Visit: Payer: Self-pay | Admitting: Family Medicine

## 2017-05-24 NOTE — Telephone Encounter (Signed)
REFILL 

## 2017-06-08 NOTE — Progress Notes (Signed)
[facility] [address] [place], [place] [address] [phone]  Date:  06/09/2017   Name:  Gregory [name]   DOB:  [birth year]   MRN:  [id number]  PCP:  Darreld Mclean, MD    Chief Complaint: Follow-up (Pt here for 6 month f/u visit. )   History of Present Illness:  Gregory [name] is a 84 y.o. very pleasant male patient who presents with the following:  Periodic follow-up today History of CRF, CAD, chronic anticoagulation with eliquis, a fib, CVA, carotid stenosis Last seen by myself in September: He is taking tramadol as needed for his back and joint pains.  He is no longer able to go off the eliquis to get shots in his back, so he is depending on the tramadol for pain  He is taking tramadol 1/2 pill TID as needed "as long as I don't do anything I don't have any pain." however he is still quite active and was laying a rock fire pit this morning  A/P Flu shot given today He is in persistent a fib, on eliquis and followed by cardiology Regular visits with nephrology, now just annually.  Renal function is stable He has hip, back pain.  He uses tramadol as needed- up to 30 per month. He does not need to increase his rx at this time Follow-up in 6 months Commended his daughter on the fine job she is doing taking care of her mom and dad   He also saw DR. Fields with vascular in the fall: ASSESSMENT: High-grade asymptomatic left internal carotid artery stenosis with moderate right internal carotid artery stenosis also asymptomatic. Had lengthy discussion with the patient today as well as his family. I recommended to him left carotid endarterectomy for stroke prophylaxis. I also discussed with him the benefits of carotid endarterectomy versus medical management of his carotid stenosis in the risk of stroke reduction of surgery compared to medical therapy. However, at this point the patient has opted for continued medical  management and carotid surveillance. He will have a repeat carotid duplex exam in 6 months time. If he changes his mind we could consider carotid endarterectomy any time in the near future.  He would like something for nasal discharge-  Notes that his nose runs all the time and annoys him No history of glaucoma and able to pass urine ok-   He is using his tramadol as needed  He is watching for any sx of carotid stenosis- he is feeling well however and does not wish to have surgery at this time He did get stitches about 3 years ago and thinks he got a tetanus shot then.  Counseled him that we will give a booster just in case if he has any significant wound  His CRI is managed by Dr[facility]  He notes that he feels well overall, he continues to be very active and to like to be outdoors as much as ge can be  Patient Active Problem List   Diagnosis Date Noted  . Symptomatic anemia   . Chronic anemia 01/13/2016  . Chronic renal failure, stage 3 (moderate) ([place]) 08/06/2015  . Vasovagal syncope   . Coronary artery disease involving native coronary artery   . 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 Syncope 07/30/2015  . Esophageal stricture   . Carotid stenosis 07/13/2013  . Anticoagulated on Eliquis 03/21/2013  . PVD - 79% LICA,  79% RICA by doppler 5/14 01/24/2013  . 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 CAD (coronary artery disease),CABG 1993-LIMA to the LAD, SVG to Om and SVG to PDA/PLA, patent on cath 2014. 03/31/2011  . Chronic a-fib ([place]) 03/31/2011  . Cardiomyopathy, ischemic, improved 03/31/2011  . CVA (cerebral vascular accident) ([place]) 03/31/2011  . GERD (gastroesophageal reflux disease) 03/31/2011  . Renal calculi 03/31/2011  . History of tobacco use, continues with chewing tobacco 03/31/2011    Past Medical History:  Diagnosis Date  . Appendicitis with abscess 03/31/2011  . Arthritis   . CAD (coronary  artery disease),CABG 1993-LIMA to the LAD, SVG to Om and SVG to PDA/PLA, patent on cath 2008. 2003   a. s/p CABG 2003. b. last cath 2008 with patent grafts.  . Carotid artery disease ([place])    a. Severe - pt refused intervention in the past.  . Chronic atrial fibrillation ([place])    permanent  . Chronic systolic CHF (congestive heart failure) ([place])    a. Prior low EF, later normalized.  . Essential hypertension   . GERD (gastroesophageal reflux disease)   . Heart attack ([place]) 1980's  . Heart murmur   . Hyperlipidemia   . Ischemic cardiomyopathy   . Kidney stones    "passed all but one time when he had to have lithotripsy" (01/21/2013)  . Pulmonary hypertension ([place])   . 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 07/2015 - after holding Xarelto x 3 days for spinal procedure.  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 SSS (sick sinus syndrome) ([place])   . Stroke [facility]) 2007   "slightly drags left foot since; recovered qthing else" (01/21/2013)  . Syncope 07/2015   a. felt vasovagal in setting of pain from renal infarct.    Past Surgical History:  Procedure Laterality Date  . BALLOON DILATION N/A 05/22/2015   Procedure: BALLOON DILATION;  Surgeon: Gatha Mayer, MD;  Location: [facility] ENDOSCOPY;  Service: Endoscopy;  Laterality: N/A;  . CARDIAC CATHETERIZATION  02/24/2002   reduced LV function, 60-70% prox RCA stenosis, 70% PLA ostial stenosis, 80% secondary branch of PLA stenosis - subsequent CABG (Dr. Jackie Plum)  . Carotid Doppler  06/2012   50-69% right bulb/prox ICA diameter reduction; 70-99% left bulb/prox ICA diameter reduction  . CATARACT EXTRACTION W/ INTRAOCULAR LENS IMPLANT Bilateral 2013  . COLONOSCOPY N/A 05/18/2016   Procedure: COLONOSCOPY;  Surgeon: Manus Gunning, MD;  Location: [facility];  Service: Gastroenterology;  Laterality: N/A;  . CORONARY ANGIOPLASTY  07/05/2006   3 vessel CAD, patent LIMA to LAD, patentVG to OM, patent SVG to PDA & PLA, mild MR, severe LV systolic dysfunction (Dr. Gerrie Nordmann)   . CORONARY ARTERY BYPASS GRAFT  03/01/2002   LIMA to LAD, reverse SVG to OM, reverse SVG to PDA of RCA, reverse SVG to PLA of RCA, ligation of LA appendage (Dr. Servando Snare)  . ENTEROSCOPY N/A 05/20/2016   Procedure: ENTEROSCOPY;  Surgeon: Manus Gunning, MD;  Location: [facility];  Service: Gastroenterology;  Laterality: N/A;  . ESOPHAGOGASTRODUODENOSCOPY  02/01/2012   Procedure: ESOPHAGOGASTRODUODENOSCOPY (EGD);  Surgeon: Beryle Beams, MD;  Location: [facility];  Service: Endoscopy;  Laterality: N/A;  . ESOPHAGOGASTRODUODENOSCOPY N/A 05/22/2015   Procedure: ESOPHAGOGASTRODUODENOSCOPY (EGD);  Surgeon: Gatha Mayer, MD;  Location: [facility];  Service: Endoscopy;  Laterality: N/A;  . ESOPHAGOGASTRODUODENOSCOPY N/A 05/17/2016   Procedure: ESOPHAGOGASTRODUODENOSCOPY (EGD);  Surgeon: Juanita Craver, MD;  Location: [facility];  Service:  Endoscopy;  Laterality: N/A;  . GIVENS CAPSULE STUDY N/A 05/18/2016   Procedure: GIVENS CAPSULE STUDY;  Surgeon: Manus Gunning, MD;  Location: [facility];  Service: Gastroenterology;  Laterality: N/A;  . LAPAROSCOPIC APPENDECTOMY  03/30/2011   Procedure: APPENDECTOMY LAPAROSCOPIC;  Surgeon: Joyice Faster. Cornett, MD;  Location: [facility];  Service: General;  Laterality: N/A;  . LEFT HEART CATHETERIZATION WITH CORONARY ANGIOGRAM N/A 01/24/2013   Procedure: LEFT HEART CATHETERIZATION WITH CORONARY ANGIOGRAM;  Surgeon: [name], MD;  Location: [facility] CATH LAB;  Service: Cardiovascular;  Laterality: N/A;  Right heart with grafts  . LITHOTRIPSY     "once" (01/21/2013)  . NM MYOCAR PERF WALL MOTION  05/2009   persantine myoview - fixed moderate perfusion defect in inferior wall & lateral segment of apex (poor non-transmural infarction), minimal anterolateral periinfarct reversible ischemia seen, abnormal study, defects similar to 2006 study  . SPLENECTOMY, TOTAL  01/2013  . TEE WITHOUT CARDIOVERSION N/A 01/23/2013   Procedure: TRANSESOPHAGEAL ECHOCARDIOGRAM  (TEE);  Surgeon: Sanda Klein, MD;  Location: [facility] ENDOSCOPY;  Service: Cardiovascular;  Laterality: N/A;  . TRANSTHORACIC ECHOCARDIOGRAM  06/23/2012   EF 40-45%, mild LVH; mild AV regurg; mild MV regurg; LV mod-severely dilated; RV mildly dilated; systolic pressure borderline increased; RA mod dilated    Social History   Tobacco Use  . Smoking status: Former Smoker    Packs/day: 2.00    Years: 20.00    Pack years: 40.00    Types: Cigarettes    Last attempt to quit: 04/08/1986    Years since quitting: 31.1  . Smokeless tobacco: Current User    Types: Chew  Substance Use Topics  . Alcohol use: No    Alcohol/week: 0.0 oz  . Drug use: No    Family History  Problem Relation Age of Onset  . Heart disease Father        rheumatic fever  . Heart attack Brother 63  . Stroke Mother   . Stroke Brother 85    Allergies  Allergen Reactions  . Percocet [Oxycodone-Acetaminophen] Itching and Nausea Only    Medication list has been reviewed and updated.  Current Outpatient Medications on File Prior to Visit  Medication Sig Dispense Refill  . apixaban (ELIQUIS) 2.5 MG TABS tablet Take 1 tablet (2.5 mg total) by mouth 2 (two) times daily. 60 tablet 11  . carvedilol (COREG) 3.125 MG tablet TAKE 1 TABLET(3.125 MG) BY MOUTH TWICE DAILY WITH A MEAL 180 tablet 0  . furosemide (LASIX) 40 MG tablet TAKE 1/2 TABLET(20 MG) BY MOUTH DAILY 45 tablet 8  . lisinopril (PRINIVIL,ZESTRIL) 10 MG tablet Take 10 mg by mouth daily.    . pantoprazole (PROTONIX) 40 MG tablet Take 1 tablet (40 mg total) by mouth daily. KEEP OV. 90 tablet 1  . rOPINIRole (REQUIP) 0.25 MG tablet TAKE 1 TABLET(0.25 MG) BY MOUTH TWICE DAILY 180 tablet 3  . rOPINIRole (REQUIP) 0.25 MG tablet TAKE 1 TABLET BY MOUTH TWICE DAILY 180 tablet 0  . rosuvastatin (CRESTOR) 40 MG tablet TAKE 1 TABLET(40 MG) BY MOUTH DAILY 90 tablet 2  . traMADol (ULTRAM) 50 MG tablet TAKE ONE-HALF TABLET BY MOUTH EVERY 8 HOURS AS NEEDED FOR PAIN 30 tablet 3    No current facility-administered medications on file prior to visit.     Review of Systems:  As per HPI- otherwise negative.   Physical Examination: Vitals:   06/09/17 1252  BP: 122/84  Pulse: 62  Temp: 97.7 F (36.5 C)  SpO2: 99%  Vitals:   06/09/17 1252  Weight: 183 lb 12.8 oz (83.4 kg)  Height: 5' 6.5" (1.689 m)   Body mass index is 29.22 kg/m. Ideal Body Weight: Weight in (lb) to have BMI = 25: 156.9  GEN: WDWN, NAD, Non-toxic, A & O x 3, looks well, overweight HEENT: Atraumatic, Normocephalic. Neck supple. No masses, No LAD.  Bilateral TM wnl, oropharynx normal.  PEERL,EOMI.   Ears and Nose: No external deformity. CV: RRR- seems to be in sinus at the moment, No M/G/R. No JVD. No thrill. No extra heart sounds. PULM: CTA B, no wheezes, crackles, rhonchi. No retractions. No resp. distress. No accessory muscle use. ABD: S, NT, ND EXTR: No c/c/e NEURO Normal gait.  PSYCH: Normally interactive. Conversant. Not depressed or anxious appearing.  Calm demeanor.    Assessment and Plan: Persistent atrial fibrillation (HCC)  Chronic kidney disease, unspecified CKD stage - Plan: Comprehensive metabolic panel  Stenosis of carotid artery, unspecified laterality  Anticoagulated on Eliquis  Rhinorrhea - Plan: ipratropium (ATROVENT) 0.03 % nasal spray  RLS (restless legs syndrome) - Plan: rOPINIRole (REQUIP) 0.25 MG tablet  Deficiency anemia - Plan: CBC  Following up today He will try atrovent nasal for persistent rhinorrhea Refilled his requip Will check labs for him today and follow-up  Signed Lamar Blinks, MD  Results for orders placed or performed in visit on 06/09/17  CBC  Result Value Ref Range   WBC 7.2 4.0 - 10.5 K/uL   RBC 4.11 (L) 4.22 - 5.81 Mil/uL   Platelets 201.0 150.0 - 400.0 K/uL   Hemoglobin 13.0 13.0 - 17.0 g/dL   HCT 38.7 (L) 39.0 - 52.0 %   MCV 94.0 78.0 - 100.0 fl   MCHC 33.7 30.0 - 36.0 g/dL   RDW 15.0 11.5 - 15.5 %  Comprehensive  metabolic panel  Result Value Ref Range   Sodium 143 135 - 145 mEq/L   Potassium 5.4 (H) 3.5 - 5.1 mEq/L   Chloride 105 96 - 112 mEq/L   CO2 29 19 - 32 mEq/L   Glucose, Bld 142 (H) 70 - 99 mg/dL   BUN 36 (H) 6 - 23 mg/dL   Creatinine, Ser 2.06 (H) 0.40 - 1.50 mg/dL   Total Bilirubin 0.4 0.2 - 1.2 mg/dL   Alkaline Phosphatase 80 39 - 117 U/L   AST 22 0 - 37 U/L   ALT 17 0 - 53 U/L   Total Protein 7.6 6.0 - 8.3 g/dL   Albumin 3.8 3.5 - 5.2 g/dL   Calcium 9.7 8.4 - 10.5 mg/dL   GFR 32.93 (L) >60.00 mL/min   Previously noted anemia is resolved Creat is a bit worse than recent- will fax to Dr. Posey Pronto and call tomorrow to let his staff know a fax is coming  Letter to pt as well

## 2017-06-09 ENCOUNTER — Encounter: Payer: Self-pay | Admitting: Family Medicine

## 2017-06-09 ENCOUNTER — Ambulatory Visit (INDEPENDENT_AMBULATORY_CARE_PROVIDER_SITE_OTHER): Payer: Medicare Other | Admitting: Family Medicine

## 2017-06-09 VITALS — BP 122/84 | HR 62 | Temp 97.7°F | Ht 66.5 in | Wt 183.8 lb

## 2017-06-09 DIAGNOSIS — I6529 Occlusion and stenosis of unspecified carotid artery: Secondary | ICD-10-CM | POA: Diagnosis not present

## 2017-06-09 DIAGNOSIS — D539 Nutritional anemia, unspecified: Secondary | ICD-10-CM | POA: Diagnosis not present

## 2017-06-09 DIAGNOSIS — G2581 Restless legs syndrome: Secondary | ICD-10-CM | POA: Diagnosis not present

## 2017-06-09 DIAGNOSIS — I4819 Other persistent atrial fibrillation: Secondary | ICD-10-CM

## 2017-06-09 DIAGNOSIS — J3489 Other specified disorders of nose and nasal sinuses: Secondary | ICD-10-CM

## 2017-06-09 DIAGNOSIS — Z7901 Long term (current) use of anticoagulants: Secondary | ICD-10-CM

## 2017-06-09 DIAGNOSIS — I481 Persistent atrial fibrillation: Secondary | ICD-10-CM

## 2017-06-09 DIAGNOSIS — N189 Chronic kidney disease, unspecified: Secondary | ICD-10-CM | POA: Diagnosis not present

## 2017-06-09 LAB — COMPREHENSIVE METABOLIC PANEL
ALT: 17 U/L (ref 0–53)
AST: 22 U/L (ref 0–37)
Albumin: 3.8 g/dL (ref 3.5–5.2)
Alkaline Phosphatase: 80 U/L (ref 39–117)
BUN: 36 mg/dL — AB (ref 6–23)
CHLORIDE: 105 meq/L (ref 96–112)
CO2: 29 meq/L (ref 19–32)
Calcium: 9.7 mg/dL (ref 8.4–10.5)
Creatinine, Ser: 2.06 mg/dL — ABNORMAL HIGH (ref 0.40–1.50)
GFR: 32.93 mL/min — AB (ref >60.00)
Glucose, Bld: 142 mg/dL — ABNORMAL HIGH (ref 70–99)
POTASSIUM: 5.4 meq/L — AB (ref 3.5–5.1)
SODIUM: 143 meq/L (ref 135–145)
TOTAL PROTEIN: 7.6 g/dL (ref 6.0–8.3)
Total Bilirubin: 0.4 mg/dL (ref 0.2–1.2)

## 2017-06-09 LAB — CBC
HEMATOCRIT: 38.7 % — AB (ref 39.0–52.0)
HEMOGLOBIN: 13 g/dL (ref 13.0–17.0)
MCHC: 33.7 g/dL (ref 30.0–36.0)
MCV: 94 fl (ref 78.0–100.0)
Platelets: 201 10*3/uL (ref 150.0–400.0)
RBC: 4.11 Mil/uL — ABNORMAL LOW (ref 4.22–5.81)
RDW: 15 % (ref 11.5–15.5)
WBC: 7.2 10*3/uL (ref 4.0–10.5)

## 2017-06-09 MED ORDER — IPRATROPIUM BROMIDE 0.03 % NA SOLN: 2.0000 | Freq: Four times a day (QID) | NASAL | 6 refills | Status: DC

## 2017-06-09 MED ORDER — ROPINIROLE HCL 0.25 MG PO TABS: ORAL_TABLET | ORAL | 3 refills | Status: DC

## 2017-06-09 NOTE — Patient Instructions (Signed)
Good to see you today- I will be in touch with your labs asap I sent in a nasal spray for you to try for nasal drainage- please let me know if not helpful for you Please see me in about 6 months and take care!

## 2017-07-15 ENCOUNTER — Ambulatory Visit: Payer: Medicare Other | Admitting: Vascular Surgery

## 2017-07-15 ENCOUNTER — Inpatient Hospital Stay (HOSPITAL_COMMUNITY): Admission: RE | Admit: 2017-07-15 | Payer: Medicare Other | Source: Ambulatory Visit

## 2017-07-27 ENCOUNTER — Ambulatory Visit: Payer: Medicare Other | Admitting: Cardiovascular Disease

## 2017-08-02 ENCOUNTER — Other Ambulatory Visit: Payer: Self-pay | Admitting: Cardiovascular Disease

## 2017-08-02 ENCOUNTER — Other Ambulatory Visit: Payer: Self-pay | Admitting: *Deleted

## 2017-08-02 MED ORDER — APIXABAN 2.5 MG PO TABS: 2.5000 mg | ORAL_TABLET | Freq: Two times a day (BID) | ORAL | 1 refills | Status: DC

## 2017-08-06 ENCOUNTER — Ambulatory Visit: Payer: Self-pay

## 2017-08-06 ENCOUNTER — Encounter: Payer: Self-pay | Admitting: Family Medicine

## 2017-08-06 ENCOUNTER — Ambulatory Visit (INDEPENDENT_AMBULATORY_CARE_PROVIDER_SITE_OTHER): Payer: Medicare Other | Admitting: Family Medicine

## 2017-08-06 VITALS — BP 98/58 | HR 62 | Temp 97.8°F | Resp 16 | Ht 65.5 in | Wt 185.4 lb

## 2017-08-06 DIAGNOSIS — R5383 Other fatigue: Secondary | ICD-10-CM | POA: Insufficient documentation

## 2017-08-06 LAB — POCT HEMOGLOBIN: Hemoglobin: 12.3 g/dL — AB (ref 14.1–18.1)

## 2017-08-06 NOTE — Telephone Encounter (Signed)
Pt.'s daughter reports he and his wife got sick last Tuesday after eating a salad. Both had vomiting and diarrhea until last Thursday. Pt. Has had weakness since that time. Is able to eat and drink without difficulty now. States "I need some [facility] work [facility] for today. No availability with Dr. Lorelei Pont today.  Reason for Disposition . [1] MODERATE weakness (i.e., interferes with work, school, normal activities) AND [2] persists > 3 days  Answer Assessment - Initial Assessment Questions 1. DESCRIPTION: "Describe how you are feeling."     Feels weak 2. SEVERITY: "How bad is it?"  "Can you stand and walk?"   - MILD - Feels weak or tired, but does not interfere with work, school or normal activities   - Central to stand and walk; weakness interferes with work, school, or normal activities   - SEVERE - Unable to stand or walk     Mild 3. ONSET:  "When did the weakness begin?"     Last Tuesday 4. CAUSE: "What do you think is causing the weakness?"     Maybe food poisoning 5. MEDICINES: "Have you recently started a new medicine or had a change in the amount of a medicine?"     No 6. OTHER SYMPTOMS: "Do you have any other symptoms?" (e.g., chest pain, fever, cough, SOB, vomiting, diarrhea, bleeding)     Had vomiting and diarrhea last Tuesday, but has been weak since then.  7. PREGNANCY: "Is there any chance you are pregnant?" "When was your last menstrual period?"     No  Protocols used: WEAKNESS (GENERALIZED) AND FATIGUE-A-AH

## 2017-08-06 NOTE — Patient Instructions (Signed)
Dehydration, Adult Dehydration is a condition in which there is not enough fluid or water in the body. This happens when you lose more fluids than you take in. Important organs, such as the kidneys, brain, and heart, cannot function without a proper amount of fluids. Any loss of fluids from the body can lead to dehydration. Dehydration can range from mild to severe. This condition should be treated right away to prevent it from becoming severe. What are the causes? This condition may be caused by:  Vomiting.  Diarrhea.  Excessive sweating, such as from heat exposure or exercise.  Not drinking enough fluid, especially: ? When ill. ? While doing activity that requires a lot of energy.  Excessive urination.  Fever.  Infection.  Certain medicines, such as medicines that cause the body to lose excess fluid (diuretics).  Inability to access safe drinking water.  Reduced physical ability to get adequate water and food.  What increases the risk? This condition is more likely to develop in people:  Who have a poorly controlled long-term (chronic) illness, such as diabetes, heart disease, or kidney disease.  Who are age 65 or older.  Who are disabled.  Who live in a place with high altitude.  Who play endurance sports.  What are the signs or symptoms? Symptoms of mild dehydration may include:  Thirst.  Dry lips.  Slightly dry mouth.  Dry, warm skin.  Dizziness. Symptoms of moderate dehydration may include:  Very dry mouth.  Muscle cramps.  Dark urine. Urine may be the color of tea.  Decreased urine production.  Decreased tear production.  Heartbeat that is irregular or faster than normal (palpitations).  Headache.  Light-headedness, especially when you stand up from a sitting position.  Fainting (syncope). Symptoms of severe dehydration may include:  Changes in skin, such as: ? Cold and clammy skin. ? Blotchy (mottled) or pale skin. ? Skin that does  not quickly return to normal after being lightly pinched and released (poor skin turgor).  Changes in body fluids, such as: ? Extreme thirst. ? No tear production. ? Inability to sweat when body temperature is high, such as in hot weather. ? Very little urine production.  Changes in vital signs, such as: ? Weak pulse. ? Pulse that is more than 100 beats a minute when sitting still. ? Rapid breathing. ? Low blood pressure.  Other changes, such as: ? Sunken eyes. ? Cold hands and feet. ? Confusion. ? Lack of energy (lethargy). ? Difficulty waking up from sleep. ? Short-term weight loss. ? Unconsciousness. How is this diagnosed? This condition is diagnosed based on your symptoms and a physical exam. Blood and urine tests may be done to help confirm the diagnosis. How is this treated? Treatment for this condition depends on the severity. Mild or moderate dehydration can often be treated at home. Treatment should be started right away. Do not wait until dehydration becomes severe. Severe dehydration is an emergency and it needs to be treated in a hospital. Treatment for mild dehydration may include:  Drinking more fluids.  Replacing salts and minerals in your blood (electrolytes) that you may have lost. Treatment for moderate dehydration may include:  Drinking an oral rehydration solution (ORS). This is a drink that helps you replace fluids and electrolytes (rehydrate). It can be found at pharmacies and retail stores. Treatment for severe dehydration may include:  Receiving fluids through an IV tube.  Receiving an electrolyte solution through a feeding tube that is passed through your nose   and into your stomach (nasogastric tube, or NG tube).  Correcting any abnormalities in electrolytes.  Treating the underlying cause of dehydration. Follow these instructions at home:  If directed by your health care provider, drink an ORS: ? Make an ORS by following instructions on the  package. ? Start by drinking small amounts, about  cup (120 mL) every 5-10 minutes. ? Slowly increase how much you drink until you have taken the amount recommended by your health care provider.  Drink enough clear fluid to keep your urine clear or pale yellow. If you were told to drink an ORS, finish the ORS first, then start slowly drinking other clear fluids. Drink fluids such as: ? Water. Do not drink only water. Doing that can lead to having too little salt (sodium) in the body (hyponatremia). ? Ice chips. ? Fruit juice that you have added water to (diluted fruit juice). ? Low-calorie sports drinks.  Avoid: ? Alcohol. ? Drinks that contain a lot of sugar. These include high-calorie sports drinks, fruit juice that is not diluted, and soda. ? Caffeine. ? Foods that are greasy or contain a lot of fat or sugar.  Take over-the-counter and prescription medicines only as told by your health care provider.  Do not take sodium tablets. This can lead to having too much sodium in the body (hypernatremia).  Eat foods that contain a healthy balance of electrolytes, such as bananas, oranges, potatoes, tomatoes, and spinach.  Keep all follow-up visits as told by your health care provider. This is important. Contact a health care provider if:  You have abdominal pain that: ? Gets worse. ? Stays in one area (localizes).  You have a rash.  You have a stiff neck.  You are more irritable than usual.  You are sleepier or more difficult to wake up than usual.  You feel weak or dizzy.  You feel very thirsty.  You have urinated only a small amount of very dark urine over 6-8 hours. Get help right away if:  You have symptoms of severe dehydration.  You cannot drink fluids without vomiting.  Your symptoms get worse with treatment.  You have a fever.  You have a severe headache.  You have vomiting or diarrhea that: ? Gets worse. ? Does not go away.  You have blood or green matter  (bile) in your vomit.  You have blood in your stool. This may cause stool to look black and tarry.  You have not urinated in 6-8 hours.  You faint.  Your heart rate while sitting still is over 100 beats a minute.  You have trouble breathing. This information is not intended to replace advice given to you by your health care provider. Make sure you discuss any questions you have with your health care provider. Document Released: 03/02/2005 Document Revised: 09/27/2015 Document Reviewed: 04/26/2015 Elsevier Interactive Patient Education  2018 [employer]  

## 2017-08-06 NOTE — Telephone Encounter (Signed)
FYI to Dr. Etter Sjogren for upcoming appt.

## 2017-08-06 NOTE — Assessment & Plan Note (Signed)
Check labs [facility] ---  Neg Maybe from low bp poc hgb ---- 12.3 Lisinopril -- cut in half F/u pcp next week If symptoms worsen-- go to [facility]

## 2017-08-06 NOTE — Progress Notes (Addendum)
Patient ID: Gregory [name], male   DOB: 08-11-1932, 84 y.o.   MRN: [id number]     Subjective:  I acted as a Education administrator for Dr. Carollee Herter.  Guerry Bruin, [place]   Patient ID: Gregory [name], male    DOB: 05/27/1932, 84 y.o.   MRN: [id number]  Chief Complaint  Patient presents with  . Fatigue    HPI  Patient is in today for weakness.  Has been feeling bad for about 1 week.  He and his wife had food poisoning last week -- no more diarrhea, n/v but pt feels exhausted .  The last time he felt this way his hgb was really low and he was admitted to the hospital.   Patient Care Team: [name], [name], MD as PCP - General (Family Medicine) Troy Sine, MD as Consulting Physician (Cardiology) Darleen Crocker, MD as Consulting Physician (Ophthalmology) Elmarie Shiley, MD as Consulting Physician (Nephrology)   Past Medical History:  Diagnosis Date  . Appendicitis with abscess 03/31/2011  . Arthritis   . CAD (coronary artery disease),CABG 1993-LIMA to the LAD, SVG to Om and SVG to PDA/PLA, patent on cath 2008. 2003   a. s/p CABG 2003. b. last cath 2008 with patent grafts.  . Carotid artery disease ([place])    a. Severe - pt refused intervention in the past.  . Chronic atrial fibrillation ([place])    permanent  . Chronic systolic CHF (congestive heart failure) ([place])    a. Prior low EF, later normalized.  . Essential hypertension   . GERD (gastroesophageal reflux disease)   . Heart attack ([place]) 1980's  . Heart murmur   . Hyperlipidemia   . Ischemic cardiomyopathy   . Kidney stones    "passed all but one time when he had to have lithotripsy" (01/21/2013)  . Pulmonary hypertension ([place])   . 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 07/2015 - after holding Xarelto x 3 days for spinal procedure.  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 SSS (sick sinus syndrome) ([place])   . Stroke [facility]) 2007   "slightly drags left foot since; recovered qthing else" (01/21/2013)  . Syncope 07/2015   a. felt vasovagal in setting of pain from renal  infarct.    Past Surgical History:  Procedure Laterality Date  . BALLOON DILATION N/A 05/22/2015   Procedure: BALLOON DILATION;  Surgeon: Gatha Mayer, MD;  Location: [facility] ENDOSCOPY;  Service: Endoscopy;  Laterality: N/A;  . CARDIAC CATHETERIZATION  02/24/2002   reduced LV function, 60-70% prox RCA stenosis, 70% PLA ostial stenosis, 80% secondary branch of PLA stenosis - subsequent CABG (Dr. Jackie Plum)  . Carotid Doppler  06/2012   50-69% right bulb/prox ICA diameter reduction; 70-99% left bulb/prox ICA diameter reduction  . CATARACT EXTRACTION W/ INTRAOCULAR LENS IMPLANT Bilateral 2013  . COLONOSCOPY N/A 05/18/2016   Procedure: COLONOSCOPY;  Surgeon: Manus Gunning, MD;  Location: [facility] ENDOSCOPY;  Service: Gastroenterology;  Laterality: N/A;  . CORONARY ANGIOPLASTY  07/05/2006   3 vessel CAD, patent LIMA to LAD, patentVG to OM, patent SVG to PDA & PLA, mild MR, severe LV systolic dysfunction (Dr. Gerrie Nordmann)  . CORONARY ARTERY BYPASS GRAFT  03/01/2002   LIMA to LAD, reverse SVG to OM, reverse SVG to PDA of RCA, reverse SVG to PLA of RCA, ligation of LA appendage (Dr. Servando Snare)  . ENTEROSCOPY N/A 05/20/2016   Procedure: ENTEROSCOPY;  Surgeon: Manus Gunning, MD;  Location: [place];  Service: Gastroenterology;  Laterality: N/A;  . ESOPHAGOGASTRODUODENOSCOPY  02/01/2012   Procedure: ESOPHAGOGASTRODUODENOSCOPY (EGD);  Surgeon: Beryle Beams, MD;  Location: [facility] ENDOSCOPY;  Service: Endoscopy;  Laterality: N/A;  . ESOPHAGOGASTRODUODENOSCOPY N/A 05/22/2015   Procedure: ESOPHAGOGASTRODUODENOSCOPY (EGD);  Surgeon: Gatha Mayer, MD;  Location: [facility] ENDOSCOPY;  Service: Endoscopy;  Laterality: N/A;  . ESOPHAGOGASTRODUODENOSCOPY N/A 05/17/2016   Procedure: ESOPHAGOGASTRODUODENOSCOPY (EGD);  Surgeon: Juanita Craver, MD;  Location: [facility] ENDOSCOPY;  Service: Endoscopy;  Laterality: N/A;  . GIVENS CAPSULE STUDY N/A 05/18/2016   Procedure: GIVENS CAPSULE STUDY;  Surgeon: Manus Gunning, MD;   Location: [place];  Service: Gastroenterology;  Laterality: N/A;  . LAPAROSCOPIC APPENDECTOMY  03/30/2011   Procedure: APPENDECTOMY LAPAROSCOPIC;  Surgeon: Joyice Faster. Cornett, MD;  Location: [place];  Service: General;  Laterality: N/A;  . LEFT HEART CATHETERIZATION WITH CORONARY ANGIOGRAM N/A 01/24/2013   Procedure: LEFT HEART CATHETERIZATION WITH CORONARY ANGIOGRAM;  Surgeon: [name], MD;  Location: [facility] CATH LAB;  Service: Cardiovascular;  Laterality: N/A;  Right heart with grafts  . LITHOTRIPSY     "once" (01/21/2013)  . NM MYOCAR PERF WALL MOTION  05/2009   persantine myoview - fixed moderate perfusion defect in inferior wall & lateral segment of apex (poor non-transmural infarction), minimal anterolateral periinfarct reversible ischemia seen, abnormal study, defects similar to 2006 study  . SPLENECTOMY, TOTAL  01/2013  . TEE WITHOUT CARDIOVERSION N/A 01/23/2013   Procedure: TRANSESOPHAGEAL ECHOCARDIOGRAM (TEE);  Surgeon: Sanda Klein, MD;  Location: [facility] ENDOSCOPY;  Service: Cardiovascular;  Laterality: N/A;  . TRANSTHORACIC ECHOCARDIOGRAM  06/23/2012   EF 40-45%, mild LVH; mild AV regurg; mild MV regurg; LV mod-severely dilated; RV mildly dilated; systolic pressure borderline increased; RA mod dilated    Family History  Problem Relation Age of Onset  . Heart disease Father        rheumatic fever  . Heart attack Brother 26  . Stroke Mother   . Stroke Brother 64    Social History   Socioeconomic History  . Marital status: Married    Spouse name: Not on file  . Number of children: 9  . Years of education: 8  . Highest education level: Not on file  Occupational History  . Occupation: retired  Scientific laboratory technician  . Financial resource strain: Not on file  . Food insecurity:    Worry: Not on file    Inability: Not on file  . Transportation needs:    Medical: Not on file    Non-medical: Not on file  Tobacco Use  . Smoking status: Former Smoker    Packs/day: 2.00    Years:  20.00    Pack years: 40.00    Types: Cigarettes    Last attempt to quit: 04/08/1986    Years since quitting: 31.3  . Smokeless tobacco: Current User    Types: Chew  Substance and Sexual Activity  . Alcohol use: No    Alcohol/week: 0.0 oz  . Drug use: No  . Sexual activity: Not on file  Lifestyle  . Physical activity:    Days per week: Not on file    Minutes per session: Not on file  . Stress: Not on file  Relationships  . Social connections:    Talks on phone: Not on file    Gets together: Not on file    Attends religious service: Not on file    Active member of club or organization: Not on file    Attends meetings of clubs or organizations: Not on file    Relationship status:  Not on file  . Intimate partner violence:    Fear of current or ex partner: Not on file    Emotionally abused: Not on file    Physically abused: Not on file    Forced sexual activity: Not on file  Other Topics Concern  . Not on file  Social History Narrative   A she is married, 5 sons 4 daughters. He is retired Horticulturist, commercial. 2-3 caffeinated beverages a day no alcohol    Outpatient Medications Prior to Visit  Medication Sig Dispense Refill  . apixaban (ELIQUIS) 2.5 MG TABS tablet Take 1 tablet (2.5 mg total) by mouth 2 (two) times daily. FOLLOW UP WITH CARDIOLOGY PRIOR TO NEXT REFILL AUTHORIZATION 60 tablet 0  . carvedilol (COREG) 3.125 MG tablet TAKE 1 TABLET(3.125 MG) BY MOUTH TWICE DAILY WITH A MEAL 180 tablet 0  . furosemide (LASIX) 40 MG tablet TAKE 1/2 TABLET(20 MG) BY MOUTH DAILY 45 tablet 8  . ipratropium (ATROVENT) 0.03 % nasal spray Place 2 sprays into the nose 4 (four) times daily. 30 mL 6  . lisinopril (PRINIVIL,ZESTRIL) 10 MG tablet Take 10 mg by mouth daily.    . pantoprazole (PROTONIX) 40 MG tablet Take 1 tablet (40 mg total) by mouth daily. KEEP OV. 90 tablet 1  . rOPINIRole (REQUIP) 0.25 MG tablet TAKE 1 TABLET(0.25 MG) BY MOUTH TWICE DAILY 180 tablet 3  . rosuvastatin (CRESTOR) 40 MG  tablet TAKE 1 TABLET(40 MG) BY MOUTH DAILY 90 tablet 2  . traMADol (ULTRAM) 50 MG tablet TAKE ONE-HALF TABLET BY MOUTH EVERY 8 HOURS AS NEEDED FOR PAIN 30 tablet 3   No facility-administered medications prior to visit.     Allergies  Allergen Reactions  . Percocet [Oxycodone-Acetaminophen] Itching and Nausea Only    Review of Systems  Constitutional: Positive for malaise/fatigue. Negative for fever.  HENT: Negative for congestion.   Eyes: Negative for blurred vision.  Respiratory: Negative for cough and shortness of breath.   Cardiovascular: Negative for chest pain, palpitations and leg swelling.  Gastrointestinal: Negative for vomiting.  Musculoskeletal: Negative for back pain.  Skin: Negative for rash.  Neurological: Negative for dizziness, tingling, focal weakness, seizures, loss of consciousness, weakness and headaches.       Objective:    Physical Exam  Constitutional: He is oriented to person, place, and time. Vital signs are normal. He appears well-developed and well-nourished. He is sleeping.  HENT:  Head: Normocephalic and atraumatic.  Mouth/Throat: Oropharynx is clear and moist.  Eyes: Pupils are equal, round, and reactive to light. EOM are normal.  Neck: Normal range of motion. Neck supple. No thyromegaly present.  Cardiovascular: Normal rate and regular rhythm.  No murmur heard. Pulmonary/Chest: Effort normal and breath sounds normal. No respiratory distress. He has no wheezes. He has no rales. He exhibits no tenderness.  Musculoskeletal: He exhibits no edema or tenderness.  Neurological: He is alert and oriented to person, place, and time. He exhibits normal muscle tone.  Skin: Skin is warm and dry.  Psychiatric: He has a normal mood and affect. His behavior is normal. Judgment and thought content normal.  Nursing note and vitals reviewed.   BP (!) 98/58 (BP Location: Right Arm, Cuff Size: Normal)   Pulse 62   Temp 97.8 F (36.6 C) (Oral)   Resp 16   Ht 5'  5.5" (1.664 m)   Wt 185 lb 6.4 oz (84.1 kg)   SpO2 98%   BMI 30.38 kg/m  Wt Readings from Last 3  Encounters:  08/06/17 185 lb 6.4 oz (84.1 kg)  06/09/17 183 lb 12.8 oz (83.4 kg)  01/14/17 183 lb (83 kg)   BP Readings from Last 3 Encounters:  08/06/17 (!) 98/58  06/09/17 122/84  01/14/17 (!) 148/62     Immunization History  Administered Date(s) Administered  . Influenza, High Dose Seasonal PF 12/04/2015, 12/09/2016  . Influenza,inj,Quad PF,6+ Mos 01/25/2013, 02/13/2015  . Meningococcal Polysaccharide 01/25/2013  . Pneumococcal Conjugate-13 10/22/2014  . Pneumococcal Polysaccharide-23 01/25/2013    Health Maintenance  Topic Date Due  . TETANUS/TDAP  04/14/1951  . INFLUENZA VACCINE  10/14/2017  . PNA vac Low Risk Adult  Completed    Lab Results  Component Value Date   WBC 7.2 06/09/2017   HGB 12.3 (A) 08/06/2017   HCT 38.7 (L) 06/09/2017   PLT 201.0 06/09/2017   GLUCOSE 142 (H) 06/09/2017   CHOL 130 09/28/2016   TRIG 76 09/28/2016   HDL 42 09/28/2016   LDLCALC 73 09/28/2016   ALT 17 06/09/2017   AST 22 06/09/2017   NA 143 06/09/2017   K 5.4 (H) 06/09/2017   CL 105 06/09/2017   CREATININE 2.06 (H) 06/09/2017   BUN 36 (H) 06/09/2017   CO2 29 06/09/2017   TSH 4.970 (H) 09/28/2016   PSA 1.24 07/13/2013   INR 1.66 (H) 08/09/2015   HGBA1C 6.5 04/22/2016    Lab Results  Component Value Date   TSH 4.970 (H) 09/28/2016   Lab Results  Component Value Date   WBC 7.2 06/09/2017   HGB 12.3 (A) 08/06/2017   HCT 38.7 (L) 06/09/2017   MCV 94.0 06/09/2017   PLT 201.0 06/09/2017   Lab Results  Component Value Date   NA 143 06/09/2017   K 5.4 (H) 06/09/2017   CO2 29 06/09/2017   GLUCOSE 142 (H) 06/09/2017   BUN 36 (H) 06/09/2017   CREATININE 2.06 (H) 06/09/2017   BILITOT 0.4 06/09/2017   ALKPHOS 80 06/09/2017   AST 22 06/09/2017   ALT 17 06/09/2017   PROT 7.6 06/09/2017   ALBUMIN 3.8 06/09/2017   CALCIUM 9.7 06/09/2017   ANIONGAP 6 05/21/2016   GFR 32.93  (L) 06/09/2017   Lab Results  Component Value Date   CHOL 130 09/28/2016   Lab Results  Component Value Date   HDL 42 09/28/2016   Lab Results  Component Value Date   LDLCALC 73 09/28/2016   Lab Results  Component Value Date   TRIG 76 09/28/2016   Lab Results  Component Value Date   CHOLHDL 3.1 09/28/2016   Lab Results  Component Value Date   HGBA1C 6.5 04/22/2016         Assessment & Plan:   Problem List Items Addressed This Visit      Unprioritized   Fatigue - Primary    Check labs Orthostatics ---  Neg Maybe from low bp poc hgb ---- 12.3 Lisinopril -- cut in half F/u pcp next week If symptoms worsen-- go to eR      Relevant Orders   POCT hemoglobin (Completed)   Comp Met (CMET)   CBC w/Diff      I am having Tion B. Woodyard maintain his furosemide, lisinopril, carvedilol, rosuvastatin, traMADol, pantoprazole, ipratropium, rOPINIRole, and apixaban.  No orders of the defined types were placed in this encounter.   CMA served as Education administrator during this visit. History, Physical and Plan performed by medical provider. Documentation and orders reviewed and attested to.  Ann Held, DO

## 2017-08-07 LAB — COMPREHENSIVE METABOLIC PANEL
AG Ratio: 1.2 (calc) (ref 1.0–2.5)
ALT: 13 U/L (ref 9–46)
AST: 22 U/L (ref 10–35)
Albumin: 3.3 g/dL — ABNORMAL LOW (ref 3.6–5.1)
Alkaline phosphatase (APISO): 82 U/L (ref 40–115)
BUN / CREAT RATIO: 12 (calc) (ref 6–22)
BUN: 25 mg/dL (ref 7–25)
CHLORIDE: 102 mmol/L (ref 98–110)
CO2: 28 mmol/L (ref 20–32)
CREATININE: 2.12 mg/dL — AB (ref 0.70–1.11)
Calcium: 8.4 mg/dL — ABNORMAL LOW (ref 8.6–10.3)
GLUCOSE: 101 mg/dL — AB (ref 65–99)
Globulin: 2.8 g/dL (calc) (ref 1.9–3.7)
Potassium: 3.3 mmol/L — ABNORMAL LOW (ref 3.5–5.3)
SODIUM: 138 mmol/L (ref 135–146)
Total Bilirubin: 0.7 mg/dL (ref 0.2–1.2)
Total Protein: 6.1 g/dL (ref 6.1–8.1)

## 2017-08-07 LAB — CBC WITH DIFFERENTIAL/PLATELET
BASOS PCT: 0.5 %
Basophils Absolute: 41 cells/uL (ref 0–200)
EOS ABS: 74 {cells}/uL (ref 15–500)
EOS PCT: 0.9 %
HEMATOCRIT: 32.9 % — AB (ref 38.5–50.0)
HEMOGLOBIN: 11.9 g/dL — AB (ref 13.2–17.1)
LYMPHS ABS: 5125 {cells}/uL — AB (ref 850–3900)
MCH: 31.6 pg (ref 27.0–33.0)
MCHC: 36.2 g/dL — ABNORMAL HIGH (ref 32.0–36.0)
MCV: 87.3 fL (ref 80.0–100.0)
MPV: 9.9 fL (ref 7.5–12.5)
Monocytes Relative: 10.8 %
NEUTROS ABS: 2075 {cells}/uL (ref 1500–7800)
Neutrophils Relative %: 25.3 %
Platelets: 154 10*3/uL (ref 140–400)
RBC: 3.77 10*6/uL — ABNORMAL LOW (ref 4.20–5.80)
RDW: 13.1 % (ref 11.0–15.0)
Total Lymphocyte: 62.5 %
WBC mixed population: 886 cells/uL (ref 200–950)
WBC: 8.2 10*3/uL (ref 3.8–10.8)

## 2017-08-25 ENCOUNTER — Ambulatory Visit: Payer: Medicare Other | Admitting: Physician Assistant

## 2017-08-25 ENCOUNTER — Encounter: Payer: Self-pay | Admitting: Physician Assistant

## 2017-08-25 VITALS — BP 130/70 | HR 52 | Ht 65.5 in | Wt 183.6 lb

## 2017-08-25 DIAGNOSIS — I482 Chronic atrial fibrillation: Secondary | ICD-10-CM

## 2017-08-25 DIAGNOSIS — I25810 Atherosclerosis of coronary artery bypass graft(s) without angina pectoris: Secondary | ICD-10-CM

## 2017-08-25 DIAGNOSIS — E785 Hyperlipidemia, unspecified: Secondary | ICD-10-CM | POA: Diagnosis not present

## 2017-08-25 DIAGNOSIS — N280 Ischemia and infarction of kidney: Secondary | ICD-10-CM

## 2017-08-25 DIAGNOSIS — I6523 Occlusion and stenosis of bilateral carotid arteries: Secondary | ICD-10-CM | POA: Diagnosis not present

## 2017-08-25 DIAGNOSIS — N184 Chronic kidney disease, stage 4 (severe): Secondary | ICD-10-CM

## 2017-08-25 DIAGNOSIS — I4821 Permanent atrial fibrillation: Secondary | ICD-10-CM

## 2017-08-25 DIAGNOSIS — I10 Essential (primary) hypertension: Secondary | ICD-10-CM | POA: Diagnosis not present

## 2017-08-25 DIAGNOSIS — G2581 Restless legs syndrome: Secondary | ICD-10-CM

## 2017-08-25 LAB — COMPREHENSIVE METABOLIC PANEL
A/G RATIO: 1.4 (ref 1.2–2.2)
ALT: 28 IU/L (ref 0–44)
AST: 35 IU/L (ref 0–40)
Albumin: 3.8 g/dL (ref 3.5–4.7)
Alkaline Phosphatase: 107 IU/L (ref 39–117)
BILIRUBIN TOTAL: 0.4 mg/dL (ref 0.0–1.2)
BUN/Creatinine Ratio: 15 (ref 10–24)
BUN: 28 mg/dL — AB (ref 8–27)
CALCIUM: 8.4 mg/dL — AB (ref 8.6–10.2)
CHLORIDE: 107 mmol/L — AB (ref 96–106)
CO2: 26 mmol/L (ref 20–29)
Creatinine, Ser: 1.92 mg/dL — ABNORMAL HIGH (ref 0.76–1.27)
GFR calc non Af Amer: 31 mL/min/{1.73_m2} — ABNORMAL LOW (ref >59)
GFR, EST AFRICAN AMERICAN: 36 mL/min/{1.73_m2} — AB (ref >59)
GLUCOSE: 101 mg/dL — AB (ref 65–99)
Globulin, Total: 2.7 g/dL (ref 1.5–4.5)
POTASSIUM: 4.1 mmol/L (ref 3.5–5.2)
Sodium: 145 mmol/L — ABNORMAL HIGH (ref 134–144)
TOTAL PROTEIN: 6.5 g/dL (ref 6.0–8.5)

## 2017-08-25 LAB — LIPID PANEL
Chol/HDL Ratio: 3.6 ratio (ref 0.0–5.0)
Cholesterol, Total: 131 mg/dL (ref 100–199)
HDL: 36 mg/dL — AB (ref >39)
LDL Calculated: 74 mg/dL (ref 0–99)
TRIGLYCERIDES: 105 mg/dL (ref 0–149)
VLDL CHOLESTEROL CAL: 21 mg/dL (ref 5–40)

## 2017-08-25 MED ORDER — CARVEDILOL 3.125 MG PO TABS: ORAL_TABLET | ORAL | 3 refills | Status: DC

## 2017-08-25 MED ORDER — FUROSEMIDE 40 MG PO TABS
ORAL_TABLET | ORAL | 3 refills | Status: DC
Start: 2017-08-25 — End: 2017-09-18

## 2017-08-25 MED ORDER — PANTOPRAZOLE SODIUM 40 MG PO TBEC: 40.0000 mg | DELAYED_RELEASE_TABLET | Freq: Every day | ORAL | 3 refills | Status: DC

## 2017-08-25 MED ORDER — LISINOPRIL 5 MG PO TABS: 5.0000 mg | ORAL_TABLET | Freq: Every day | ORAL | 3 refills | Status: DC

## 2017-08-25 MED ORDER — ROPINIROLE HCL 0.25 MG PO TABS: ORAL_TABLET | ORAL | 3 refills | Status: DC

## 2017-08-25 MED ORDER — ROSUVASTATIN CALCIUM 40 MG PO TABS: ORAL_TABLET | ORAL | 3 refills | Status: DC

## 2017-08-25 NOTE — Patient Instructions (Addendum)
Medication Instructions: Your physician recommends that you continue on your current medications as directed. Please refer to the Current Medication list given to you today.  If you need a refill on your cardiac medications before your next appointment, please call your pharmacy.   Labwork: Your provider would like for you to have the following labs today: FASTING lipid and cmet  Follow-Up: Your physician wants you to follow-up in 6-8 months with Dr. Dow Adolph will receive a reminder letter in the mail two months in advance. If you don't receive a letter, please call our office at [phone] to schedule this follow-up appointment.   Thank you for choosing [facility] at [facility]!!

## 2017-08-25 NOTE — Progress Notes (Signed)
Cardiology Office Note    Date:  08/25/2017   ID:  Gregory [name], DOB [birth year], MRN [id number]  PCP:  Darreld Mclean, MD  Cardiologist:  Dr. Claiborne Billings  Nephrologist: Dr. Posey Pronto  Chief Complaint  Patient presents with  . Follow-up    seen for Dr. Claiborne Billings. 6 month visit    History of Present Illness:  Gregory [name] is a 84 y.o. male with PMH of CAD s/p CABG (LIMA to LAD, SVG to OM, sequential SVG to PDA and PLA of RCA by Dr. Servando Snare) 2003, chronic atrial fibrillation, carotid artery disease, hypertension, ischemic cardiomyopathy, hyperlipidemia, history of heart pulmonary hypertension, history of renal infarct after holding Xarelto for spinal procedure in 2017, history of CVA, syncope, and history of sick sinus syndrome.  Last cardiac catheterization in 2008 showed EF 20 to 25%, he declined consideration for ICD.  EF improved to 35 to 40% by September 2012 on medical therapy.  In the past he refused Coumadin therapy and was maintained on aspirin and Plavix.  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  He underwent TEE on 01/23/2013 which revealed ejection fraction around 15 to 25%, diffuse hypokinesis with severe hypokinesis to akinesis of the anteroseptal and apical myocardium.  There was large solid fixed thrombus occupying the entire atrial appendage with moderate continued spontaneous echo contrast in the left atrial cavity.  There was no evidence for right atrial thrombus.  At that time, patient refused LifeVest.  He was placed on Xarelto for anticoagulation.  He has been in permanent atrial fibrillation, however has been rate controlled.  Repeat echocardiogram in April 2015 showed significant improvement in LV function to 50 to 55%, mild aortic root dilatation, mild AI, mild MR.  He has significant bilateral carotid artery disease and has been seen by Dr. Oneida Alar in the past.  He has refused to consider any potential carotid intervention or surgery.  In May  2017, he held his Xarelto for pain management injection in his back however suffered a renal infarct afterward.  He later developed mild anemia with hemoglobin drop of 3 points, CT of abdomen and pelvis did not show acute finding or peritoneal bleeding.  He has since been switched to Eliquis.  Echocardiogram in May 2017 showed EF 55 to 60%, mild AI, mild aortic root dilatation and mild MR.  Latest carotid Doppler obtained in November 2018 showed 80 to 99% left internal carotid artery disease, 60 to 79% right internal carotid artery disease.  Patient presents today for cardiology office visit.  He denies any recent chest pain or shortness of breath.  According to his daughter, he is quite active at home.  He does have some back pain issue, however he has been doing very well from cardiology perspective.  He recently had a dehydration episode and his creatinine went up to greater than 2, he has since recovered.  He was instructed to lower his lisinopril to 5 mg daily due to hypotension.  He presents today for cardiology office visit, even on the lower dose of lisinopril, his blood pressure is quite well controlled.  I will maintain him on the lower dose of 5 mg daily of lisinopril.  He is due for fasting lipid panel and complete metabolic panel.  He does not have significant lower extremity edema, orthopnea or PND.  He has been doing well on the Eliquis without bleeding.  Otherwise he can follow-up with Dr. Claiborne Billings in 6 to 8  months.   Past Medical History:  Diagnosis Date  . Appendicitis with abscess 03/31/2011  . Arthritis   . CAD (coronary artery disease),CABG 1993-LIMA to the LAD, SVG to Om and SVG to PDA/PLA, patent on cath 2008. 2003   a. s/p CABG 2003. b. last cath 2008 with patent grafts.  . Carotid artery disease ([place])    a. Severe - pt refused intervention in the past.  . Chronic atrial fibrillation ([place])    permanent  . Chronic systolic CHF (congestive heart failure) ([place])    a. Prior low EF,  later normalized.  . Essential hypertension   . GERD (gastroesophageal reflux disease)   . Heart attack ([place]) 1980's  . Heart murmur   . Hyperlipidemia   . Ischemic cardiomyopathy   . Kidney stones    "passed all but one time when he had to have lithotripsy" (01/21/2013)  . Pulmonary hypertension ([place])   . Renal infarct ([place])    a. 07/2015 - after holding Xarelto x 3 days for spinal procedure.  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 SSS (sick sinus syndrome) ([place])   . Stroke [facility]) 2007   "slightly drags left foot since; recovered qthing else" (01/21/2013)  . Syncope 07/2015   a. felt vasovagal in setting of pain from renal infarct.    Past Surgical History:  Procedure Laterality Date  . BALLOON DILATION N/A 05/22/2015   Procedure: BALLOON DILATION;  Surgeon: Gatha Mayer, MD;  Location: [facility] ENDOSCOPY;  Service: Endoscopy;  Laterality: N/A;  . CARDIAC CATHETERIZATION  02/24/2002   reduced LV function, 60-70% prox RCA stenosis, 70% PLA ostial stenosis, 80% secondary branch of PLA stenosis - subsequent CABG (Dr. Jackie Plum)  . Carotid Doppler  06/2012   50-69% right bulb/prox ICA diameter reduction; 70-99% left bulb/prox ICA diameter reduction  . CATARACT EXTRACTION W/ INTRAOCULAR LENS IMPLANT Bilateral 2013  . COLONOSCOPY N/A 05/18/2016   Procedure: COLONOSCOPY;  Surgeon: Manus Gunning, MD;  Location: [facility] ENDOSCOPY;  Service: Gastroenterology;  Laterality: N/A;  . CORONARY ANGIOPLASTY  07/05/2006   3 vessel CAD, patent LIMA to LAD, patentVG to OM, patent SVG to PDA & PLA, mild MR, severe LV systolic dysfunction (Dr. Gerrie Nordmann)  . CORONARY ARTERY BYPASS GRAFT  03/01/2002   LIMA to LAD, reverse SVG to OM, reverse SVG to PDA of RCA, reverse SVG to PLA of RCA, ligation of LA appendage (Dr. Servando Snare)  . ENTEROSCOPY N/A 05/20/2016   Procedure: ENTEROSCOPY;  Surgeon: Manus Gunning, MD;  Location: [place];  Service: Gastroenterology;  Laterality: N/A;  . ESOPHAGOGASTRODUODENOSCOPY   02/01/2012   Procedure: ESOPHAGOGASTRODUODENOSCOPY (EGD);  Surgeon: Beryle Beams, MD;  Location: [facility];  Service: Endoscopy;  Laterality: N/A;  . ESOPHAGOGASTRODUODENOSCOPY N/A 05/22/2015   Procedure: ESOPHAGOGASTRODUODENOSCOPY (EGD);  Surgeon: Gatha Mayer, MD;  Location: [facility];  Service: Endoscopy;  Laterality: N/A;  . ESOPHAGOGASTRODUODENOSCOPY N/A 05/17/2016   Procedure: ESOPHAGOGASTRODUODENOSCOPY (EGD);  Surgeon: Juanita Craver, MD;  Location: [facility] ENDOSCOPY;  Service: Endoscopy;  Laterality: N/A;  . GIVENS CAPSULE STUDY N/A 05/18/2016   Procedure: GIVENS CAPSULE STUDY;  Surgeon: Manus Gunning, MD;  Location: [facility];  Service: Gastroenterology;  Laterality: N/A;  . LAPAROSCOPIC APPENDECTOMY  03/30/2011   Procedure: APPENDECTOMY LAPAROSCOPIC;  Surgeon: Joyice Faster. Cornett, MD;  Location: [place];  Service: General;  Laterality: N/A;  . LEFT HEART CATHETERIZATION WITH CORONARY ANGIOGRAM N/A 01/24/2013   Procedure: LEFT HEART CATHETERIZATION WITH CORONARY ANGIOGRAM;  Surgeon: [name], MD;  Location:  [place] CATH LAB;  Service: Cardiovascular;  Laterality: N/A;  Right heart with grafts  . LITHOTRIPSY     "once" (01/21/2013)  . NM MYOCAR PERF WALL MOTION  05/2009   persantine myoview - fixed moderate perfusion defect in inferior wall & lateral segment of apex (poor non-transmural infarction), minimal anterolateral periinfarct reversible ischemia seen, abnormal study, defects similar to 2006 study  . SPLENECTOMY, TOTAL  01/2013  . TEE WITHOUT CARDIOVERSION N/A 01/23/2013   Procedure: TRANSESOPHAGEAL ECHOCARDIOGRAM (TEE);  Surgeon: Sanda Klein, MD;  Location: [facility] ENDOSCOPY;  Service: Cardiovascular;  Laterality: N/A;  . TRANSTHORACIC ECHOCARDIOGRAM  06/23/2012   EF 40-45%, mild LVH; mild AV regurg; mild MV regurg; LV mod-severely dilated; RV mildly dilated; systolic pressure borderline increased; RA mod dilated    Current Medications: Outpatient Medications Prior to Visit    Medication Sig Dispense Refill  . apixaban (ELIQUIS) 2.5 MG TABS tablet Take 1 tablet (2.5 mg total) by mouth 2 (two) times daily. FOLLOW UP WITH CARDIOLOGY PRIOR TO NEXT REFILL AUTHORIZATION 60 tablet 0  . ipratropium (ATROVENT) 0.03 % nasal spray Place 2 sprays into the nose 4 (four) times daily. 30 mL 6  . traMADol (ULTRAM) 50 MG tablet TAKE ONE-HALF TABLET BY MOUTH EVERY 8 HOURS AS NEEDED FOR PAIN 30 tablet 3  . carvedilol (COREG) 3.125 MG tablet TAKE 1 TABLET(3.125 MG) BY MOUTH TWICE DAILY WITH A MEAL 180 tablet 0  . furosemide (LASIX) 40 MG tablet TAKE 1/2 TABLET(20 MG) BY MOUTH DAILY 45 tablet 8  . lisinopril (PRINIVIL,ZESTRIL) 10 MG tablet Take 5 mg by mouth daily.     . pantoprazole (PROTONIX) 40 MG tablet Take 1 tablet (40 mg total) by mouth daily. KEEP OV. 90 tablet 1  . rOPINIRole (REQUIP) 0.25 MG tablet TAKE 1 TABLET(0.25 MG) BY MOUTH TWICE DAILY 180 tablet 3  . rosuvastatin (CRESTOR) 40 MG tablet TAKE 1 TABLET(40 MG) BY MOUTH DAILY 90 tablet 2   No facility-administered medications prior to visit.      Allergies:   Percocet [oxycodone-acetaminophen]   Social History   Socioeconomic History  . Marital status: Married    Spouse name: Not on file  . Number of children: 9  . Years of education: 8  . Highest education level: Not on file  Occupational History  . Occupation: retired  Scientific laboratory technician  . Financial resource strain: Not on file  . Food insecurity:    Worry: Not on file    Inability: Not on file  . Transportation needs:    Medical: Not on file    Non-medical: Not on file  Tobacco Use  . Smoking status: Former Smoker    Packs/day: 2.00    Years: 20.00    Pack years: 40.00    Types: Cigarettes    Last attempt to quit: 04/08/1986    Years since quitting: 31.4  . Smokeless tobacco: Current User    Types: Chew  Substance and Sexual Activity  . Alcohol use: No    Alcohol/week: 0.0 oz  . Drug use: No  . Sexual activity: Not on file  Lifestyle  . Physical  activity:    Days per week: Not on file    Minutes per session: Not on file  . Stress: Not on file  Relationships  . Social connections:    Talks on phone: Not on file    Gets together: Not on file    Attends religious service: Not on file    Active member of club or  organization: Not on file    Attends meetings of clubs or organizations: Not on file    Relationship status: Not on file  Other Topics Concern  . Not on file  Social History Narrative   A she is married, 5 sons 4 daughters. He is retired Horticulturist, commercial. 2-3 caffeinated beverages a day no alcohol     Family History:  The patient's family history includes Heart attack (age of onset: 55) in his brother; Heart disease in his father; Stroke in his mother; Stroke (age of onset: 23) in his brother.   ROS:   Please see the history of present illness.    ROS All other systems reviewed and are negative.   PHYSICAL EXAM:   VS:  BP 130/70   Pulse (!) 52   Ht 5' 5.5" (1.664 m)   Wt 183 lb 9.6 oz (83.3 kg)   BMI 30.09 kg/m    GEN: Well nourished, well developed, in no acute distress  HEENT: normal  Neck: no JVD, carotid bruits, or masses Cardiac: Irregular; no murmurs, rubs, or gallops,no edema  Respiratory:  clear to auscultation bilaterally, normal work of breathing GI: soft, nontender, nondistended, + BS MS: no deformity or atrophy  Skin: warm and dry, no rash Neuro:  Alert and Oriented x 3, Strength and sensation are intact Psych: euthymic mood, full affect  Wt Readings from Last 3 Encounters:  08/25/17 183 lb 9.6 oz (83.3 kg)  08/06/17 185 lb 6.4 oz (84.1 kg)  06/09/17 183 lb 12.8 oz (83.4 kg)      Studies/Labs Reviewed:   EKG:  EKG is ordered today.  The ekg ordered today demonstrates atrial fibrillation, bradycardic  Recent Labs: 09/28/2016: TSH 4.970 08/06/2017: ALT 13; BUN 25; Creat 2.12; Hemoglobin 11.9; Platelets 154; Potassium 3.3; Sodium 138   Lipid Panel    Component Value Date/Time   CHOL 130  09/28/2016 0859   TRIG 76 09/28/2016 0859   HDL 42 09/28/2016 0859   CHOLHDL 3.1 09/28/2016 0859   CHOLHDL 2.8 11/27/2015 0830   VLDL 19 11/27/2015 0830   LDLCALC 73 09/28/2016 0859    Additional studies/ records that were reviewed today include:   Echo 07/31/2015 LV EF: 55% -   60% Study Conclusions  - Left ventricle: The cavity size was normal. Wall thickness was   normal. Systolic function was normal. The estimated ejection   fraction was in the range of 55% to 60%. Wall motion was normal;   there were no regional wall motion abnormalities. The study is   not technically sufficient to allow evaluation of LV diastolic   function. - Aortic valve: Valve mobility was restricted. There was mild   regurgitation. - Aorta: Aortic root dimension: 41 mm (ED). - Ascending aorta: The ascending aorta was mildly dilated. - Mitral valve: Calcified annulus. There was mild regurgitation. - Left atrium: The atrium was mildly dilated. Anterior-posterior   dimension: 46 mm.  Impressions:  - Compared to the prior study, there has been no significant   interval change.    ASSESSMENT:    1. Coronary artery disease involving coronary bypass graft of native heart without angina pectoris   2. RLS (restless legs syndrome)   3. Hyperlipidemia, unspecified hyperlipidemia type   4. Essential hypertension   5. Permanent atrial fibrillation ([place])   6. Bilateral carotid artery stenosis   7. Renal infarct ([place])   8. CKD (chronic kidney disease), stage IV (HCC)      PLAN:  In order of  problems listed above:  1. CAD s/p CABG: Denies any recent chest discomfort or shortness of breath.  Not on aspirin given the need for Eliquis.  Continue on Crestor 40 mg daily.  Despite a prior history of ischemic cardiomyopathy, his EF actually improved to 55% by 2017.  He appears to be euvolemic on physical exam.  2. Permanent atrial fibrillation: Bradycardic on the lowest possible dose of carvedilol,  reportedly had a history of sick sinus syndrome.  Heart rate is in the low 50s today, however patient denies any recent dizziness, blurred vision or feeling of passing out.  Will continue on the current rate control regimen.  3. Bilateral carotid artery disease: Followed by Dr. Oneida Alar, last carotid Doppler was in November 2018, this showed significant disease however patient refuses surgical intervention for the carotid artery disease  4. Stage IV CKD: Followed by Dr. Posey Pronto, creatinine was around 2 back in 2017, this improved to 1.6 x 2018, since then renal function worsened recently.  However according to family, this occurred in the setting of a dehydration episode with nausea and vomiting along with hypotension.  Symptom improved later after the patient decreased lisinopril down to 5 mg daily.  I will keep him on the current 5 mg daily of lisinopril.  I will obtain a basic metabolic panel today.  5. Hypertension: Blood pressure is stable despite recent decrease of lisinopril.  We will continue him on the low-dose lisinopril and low-dose carvedilol.  6. Hyperlipidemia: Fasting lipid panel and LFT today.  Continue on Crestor  7. Renal infarct: Patient has both a history of splenic infarct and renal infarct, this usually occurs when he take himself off of blood thinner.  He has a known history of forming thrombus in the left atrial appendage.    Medication Adjustments/Labs and Tests Ordered: Current medicines are reviewed at length with the patient today.  Concerns regarding medicines are outlined above.  Medication changes, Labs and Tests ordered today are listed in the Patient Instructions below. Patient Instructions  Medication Instructions: Your physician recommends that you continue on your current medications as directed. Please refer to the Current Medication list given to you today.  If you need a refill on your cardiac medications before your next appointment, please call your pharmacy.     Labwork: Your provider would like for you to have the following labs today: FASTING lipid and cmet  Follow-Up: Your physician wants you to follow-up in 6-8 months with Dr. Dow Adolph will receive a reminder letter in the mail two months in advance. If you don't receive a letter, please call our office at [phone] to schedule this follow-up appointment.   Thank you for choosing [facility], [place], [place]  08/25/2017 1:20 PM    [address], [place],   [address] Phone: [phone]; Fax: [phone]

## 2017-09-01 ENCOUNTER — Telehealth: Payer: Self-pay | Admitting: Cardiovascular Disease

## 2017-09-01 NOTE — Telephone Encounter (Signed)
Wife aware, see result note

## 2017-09-01 NOTE — Telephone Encounter (Signed)
New Message:      Pt is calling back for lab results

## 2017-09-14 ENCOUNTER — Emergency Department (HOSPITAL_COMMUNITY): Payer: Medicare Other

## 2017-09-14 ENCOUNTER — Encounter (HOSPITAL_COMMUNITY): Payer: Self-pay

## 2017-09-14 ENCOUNTER — Other Ambulatory Visit: Payer: Self-pay

## 2017-09-14 ENCOUNTER — Inpatient Hospital Stay (HOSPITAL_COMMUNITY): Payer: Medicare Other

## 2017-09-14 ENCOUNTER — Inpatient Hospital Stay (HOSPITAL_COMMUNITY)
Admission: EM | Admit: 2017-09-14 | Discharge: 2017-09-18 | DRG: 286 | Disposition: A | Discharge status: Home / Self Care | Payer: Medicare Other | Attending: Internal Medicine | Admitting: Internal Medicine

## 2017-09-14 DIAGNOSIS — N184 Chronic kidney disease, stage 4 (severe): Secondary | ICD-10-CM | POA: Diagnosis present

## 2017-09-14 DIAGNOSIS — Z720 Tobacco use: Secondary | ICD-10-CM

## 2017-09-14 DIAGNOSIS — I495 Sick sinus syndrome: Secondary | ICD-10-CM | POA: Diagnosis not present

## 2017-09-14 DIAGNOSIS — I255 Ischemic cardiomyopathy: Secondary | ICD-10-CM | POA: Diagnosis present

## 2017-09-14 DIAGNOSIS — N179 Acute kidney failure, unspecified: Secondary | ICD-10-CM | POA: Diagnosis present

## 2017-09-14 DIAGNOSIS — I493 Ventricular premature depolarization: Secondary | ICD-10-CM | POA: Diagnosis not present

## 2017-09-14 DIAGNOSIS — J9600 Acute respiratory failure, unspecified whether with hypoxia or hypercapnia: Secondary | ICD-10-CM | POA: Diagnosis present

## 2017-09-14 DIAGNOSIS — T18128A Food in esophagus causing other injury, initial encounter: Secondary | ICD-10-CM | POA: Diagnosis not present

## 2017-09-14 DIAGNOSIS — Z9081 Acquired absence of spleen: Secondary | ICD-10-CM | POA: Diagnosis not present

## 2017-09-14 DIAGNOSIS — K222 Esophageal obstruction: Secondary | ICD-10-CM | POA: Diagnosis not present

## 2017-09-14 DIAGNOSIS — I340 Nonrheumatic mitral (valve) insufficiency: Secondary | ICD-10-CM

## 2017-09-14 DIAGNOSIS — I272 Pulmonary hypertension, unspecified: Secondary | ICD-10-CM | POA: Diagnosis not present

## 2017-09-14 DIAGNOSIS — Z7901 Long term (current) use of anticoagulants: Secondary | ICD-10-CM | POA: Diagnosis not present

## 2017-09-14 DIAGNOSIS — I25810 Atherosclerosis of coronary artery bypass graft(s) without angina pectoris: Secondary | ICD-10-CM | POA: Diagnosis not present

## 2017-09-14 DIAGNOSIS — N183 Chronic kidney disease, stage 3 unspecified: Secondary | ICD-10-CM | POA: Diagnosis present

## 2017-09-14 DIAGNOSIS — J9601 Acute respiratory failure with hypoxia: Secondary | ICD-10-CM | POA: Diagnosis present

## 2017-09-14 DIAGNOSIS — I509 Heart failure, unspecified: Secondary | ICD-10-CM | POA: Diagnosis not present

## 2017-09-14 DIAGNOSIS — I959 Hypotension, unspecified: Secondary | ICD-10-CM | POA: Diagnosis not present

## 2017-09-14 DIAGNOSIS — K219 Gastro-esophageal reflux disease without esophagitis: Secondary | ICD-10-CM | POA: Diagnosis not present

## 2017-09-14 DIAGNOSIS — Z951 Presence of aortocoronary bypass graft: Secondary | ICD-10-CM

## 2017-09-14 DIAGNOSIS — Z79899 Other long term (current) drug therapy: Secondary | ICD-10-CM | POA: Diagnosis not present

## 2017-09-14 DIAGNOSIS — I5022 Chronic systolic (congestive) heart failure: Secondary | ICD-10-CM | POA: Diagnosis present

## 2017-09-14 DIAGNOSIS — I2510 Atherosclerotic heart disease of native coronary artery without angina pectoris: Secondary | ICD-10-CM | POA: Diagnosis present

## 2017-09-14 DIAGNOSIS — D631 Anemia in chronic kidney disease: Secondary | ICD-10-CM | POA: Diagnosis present

## 2017-09-14 DIAGNOSIS — I110 Hypertensive heart disease with heart failure: Secondary | ICD-10-CM | POA: Diagnosis not present

## 2017-09-14 DIAGNOSIS — E785 Hyperlipidemia, unspecified: Secondary | ICD-10-CM | POA: Diagnosis present

## 2017-09-14 DIAGNOSIS — I482 Chronic atrial fibrillation, unspecified: Secondary | ICD-10-CM | POA: Diagnosis present

## 2017-09-14 DIAGNOSIS — I5023 Acute on chronic systolic (congestive) heart failure: Secondary | ICD-10-CM | POA: Diagnosis not present

## 2017-09-14 DIAGNOSIS — I130 Hypertensive heart and chronic kidney disease with heart failure and stage 1 through stage 4 chronic kidney disease, or unspecified chronic kidney disease: Secondary | ICD-10-CM | POA: Diagnosis not present

## 2017-09-14 DIAGNOSIS — D649 Anemia, unspecified: Secondary | ICD-10-CM | POA: Diagnosis not present

## 2017-09-14 DIAGNOSIS — Z8673 Personal history of transient ischemic attack (TIA), and cerebral infarction without residual deficits: Secondary | ICD-10-CM | POA: Diagnosis not present

## 2017-09-14 DIAGNOSIS — R0602 Shortness of breath: Secondary | ICD-10-CM | POA: Diagnosis not present

## 2017-09-14 DIAGNOSIS — I351 Nonrheumatic aortic (valve) insufficiency: Secondary | ICD-10-CM | POA: Diagnosis not present

## 2017-09-14 DIAGNOSIS — I4891 Unspecified atrial fibrillation: Secondary | ICD-10-CM | POA: Diagnosis not present

## 2017-09-14 HISTORY — DX: Acute respiratory failure, unspecified whether with hypoxia or hypercapnia: J96.00

## 2017-09-14 LAB — COMPREHENSIVE METABOLIC PANEL
ALBUMIN: 3.3 g/dL — AB (ref 3.5–5.0)
ALK PHOS: 83 U/L (ref 38–126)
ALT: 15 U/L (ref 0–44)
AST: 26 U/L (ref 15–41)
Anion gap: 6 (ref 5–15)
BUN: 19 mg/dL (ref 8–23)
CALCIUM: 8.6 mg/dL — AB (ref 8.9–10.3)
CO2: 25 mmol/L (ref 22–32)
Chloride: 110 mmol/L (ref 98–111)
Creatinine, Ser: 1.58 mg/dL — ABNORMAL HIGH (ref 0.61–1.24)
GFR calc non Af Amer: 39 mL/min — ABNORMAL LOW (ref >60)
GFR, EST AFRICAN AMERICAN: 45 mL/min — AB (ref >60)
GLUCOSE: 124 mg/dL — AB (ref 70–99)
POTASSIUM: 4.4 mmol/L (ref 3.5–5.1)
SODIUM: 141 mmol/L (ref 135–145)
Total Bilirubin: 1.2 mg/dL (ref 0.3–1.2)
Total Protein: 7.1 g/dL (ref 6.5–8.1)

## 2017-09-14 LAB — CBC WITH DIFFERENTIAL/PLATELET
Abs Immature Granulocytes: 0 10*3/uL (ref 0.0–0.1)
BASOS ABS: 0 10*3/uL (ref 0.0–0.1)
BASOS PCT: 1 %
EOS ABS: 0.1 10*3/uL (ref 0.0–0.7)
EOS PCT: 1 %
HCT: 36.3 % — ABNORMAL LOW (ref 39.0–52.0)
Hemoglobin: 11.5 g/dL — ABNORMAL LOW (ref 13.0–17.0)
Immature Granulocytes: 0 %
Lymphocytes Relative: 24 %
Lymphs Abs: 2 10*3/uL (ref 0.7–4.0)
MCH: 31.8 pg (ref 26.0–34.0)
MCHC: 31.7 g/dL (ref 30.0–36.0)
MCV: 100.3 fL — ABNORMAL HIGH (ref 78.0–100.0)
MONO ABS: 0.6 10*3/uL (ref 0.1–1.0)
Monocytes Relative: 7 %
Neutro Abs: 5.3 10*3/uL (ref 1.7–7.7)
Neutrophils Relative %: 67 %
Platelets: 211 10*3/uL (ref 150–400)
RBC: 3.62 MIL/uL — AB (ref 4.22–5.81)
RDW: 15.9 % — AB (ref 11.5–15.5)
WBC: 8.1 10*3/uL (ref 4.0–10.5)

## 2017-09-14 LAB — TROPONIN I: Troponin I: 0.08 ng/mL (ref <0.03)

## 2017-09-14 LAB — BRAIN NATRIURETIC PEPTIDE: B Natriuretic Peptide: 413.7 pg/mL — ABNORMAL HIGH (ref 0.0–100.0)

## 2017-09-14 LAB — I-STAT TROPONIN, ED: Troponin i, poc: 0.01 ng/mL (ref 0.00–0.08)

## 2017-09-14 LAB — ECHOCARDIOGRAM COMPLETE

## 2017-09-14 MED ORDER — APIXABAN 2.5 MG PO TABS
2.5000 mg | ORAL_TABLET | Freq: Two times a day (BID) | ORAL | Status: DC
  Administered 2017-09-14 – 2017-09-16 (×5): 2.5 mg via ORAL
  Filled 2017-09-14 (×5): qty 1

## 2017-09-14 MED ORDER — SODIUM CHLORIDE 0.9% FLUSH
3.0000 mL | INTRAVENOUS | Status: DC | PRN
Start: 2017-09-14 — End: 2017-09-18

## 2017-09-14 MED ORDER — SODIUM CHLORIDE 0.9 % IV BOLUS: 1000.0000 mL | Freq: Once | INTRAVENOUS | Status: DC

## 2017-09-14 MED ORDER — SODIUM CHLORIDE 0.9 % IV SOLN: 250.0000 mL | INTRAVENOUS | Status: DC | PRN

## 2017-09-14 MED ORDER — ONDANSETRON HCL 4 MG/2ML IJ SOLN: 4.0000 mg | Freq: Four times a day (QID) | INTRAMUSCULAR | Status: DC | PRN

## 2017-09-14 MED ORDER — CARVEDILOL 3.125 MG PO TABS
3.1250 mg | ORAL_TABLET | Freq: Two times a day (BID) | ORAL | Status: DC
  Administered 2017-09-14 – 2017-09-18 (×5): 3.125 mg via ORAL
  Filled 2017-09-14 (×6): qty 1

## 2017-09-14 MED ORDER — FUROSEMIDE 10 MG/ML IJ SOLN
40.0000 mg | Freq: Once | INTRAMUSCULAR | Status: AC
  Administered 2017-09-14: 40 mg via INTRAVENOUS
  Filled 2017-09-14: qty 4

## 2017-09-14 MED ORDER — SODIUM CHLORIDE 0.9% FLUSH
3.0000 mL | Freq: Two times a day (BID) | INTRAVENOUS | Status: DC
  Administered 2017-09-14 – 2017-09-16 (×5): 3 mL via INTRAVENOUS

## 2017-09-14 MED ORDER — PANTOPRAZOLE SODIUM 40 MG PO TBEC
40.0000 mg | DELAYED_RELEASE_TABLET | Freq: Every day | ORAL | Status: DC
  Administered 2017-09-14 – 2017-09-16 (×3): 40 mg via ORAL
  Filled 2017-09-14 (×3): qty 1

## 2017-09-14 MED ORDER — ACETAMINOPHEN 325 MG PO TABS: 650.0000 mg | ORAL_TABLET | ORAL | Status: DC | PRN

## 2017-09-14 MED ORDER — TRAMADOL HCL 50 MG PO TABS
25.0000 mg | ORAL_TABLET | Freq: Two times a day (BID) | ORAL | Status: DC | PRN
Start: 2017-09-14 — End: 2017-09-18

## 2017-09-14 MED ORDER — FUROSEMIDE 10 MG/ML IJ SOLN
40.0000 mg | Freq: Two times a day (BID) | INTRAMUSCULAR | Status: DC
  Administered 2017-09-14 – 2017-09-15 (×2): 40 mg via INTRAVENOUS
  Filled 2017-09-14 (×2): qty 4

## 2017-09-14 MED ORDER — LISINOPRIL 5 MG PO TABS
5.0000 mg | ORAL_TABLET | Freq: Every day | ORAL | Status: DC
  Administered 2017-09-14 – 2017-09-18 (×3): 5 mg via ORAL
  Filled 2017-09-14 (×2): qty 1
  Filled 2017-09-14: qty 2

## 2017-09-14 MED ORDER — ORAL CARE MOUTH RINSE
15.0000 mL | Freq: Two times a day (BID) | OROMUCOSAL | Status: DC
  Administered 2017-09-15 – 2017-09-18 (×3): 15 mL via OROMUCOSAL

## 2017-09-14 MED ORDER — DIPHENHYDRAMINE HCL 50 MG/ML IJ SOLN: 25.0000 mg | Freq: Once | INTRAMUSCULAR | Status: DC

## 2017-09-14 MED ORDER — METOCLOPRAMIDE HCL 5 MG/ML IJ SOLN: 10.0000 mg | Freq: Once | INTRAMUSCULAR | Status: DC

## 2017-09-14 NOTE — ED Provider Notes (Signed)
[facility] EMERGENCY DEPARTMENT Provider Note   CSN: [id number] Arrival date & time: 09/14/17  1937     History   Chief Complaint Chief Complaint  Patient presents with  . Shortness of Breath    HPI Gregory [name] is a 84 y.o. male.  Patient is an 84 year old male with a history of coronary artery disease status post CABG and stenting, chronic A. fib on Eliquis, CHF, ischemic cardiomyopathy, pulmonary hypertension who is presenting today with sudden onset of shortness of breath.  Patient states over the last week and a half he has had a mild cough that he has been taking Mucinex for but this morning when he woke up he could not catch his breath.  Walking made it worse but even with rest it would not go away.  He has had not had worsening cough today, chest pain, nausea, vomiting or abdominal pain.  He has not noticed new swelling in his legs.  Patient is still taking all of his medications as prescribed and has not had any recent medication changes.  He denies any history of lung problems he is not a smoker and he does not wear oxygen at home.  When EMS arrived patient's oxygen saturation were 88% on room air.  He has noted improvement with oxygen but is still complaining of shortness of breath.  The history is provided by the patient.  Shortness of Breath     Past Medical History:  Diagnosis Date  . Appendicitis with abscess 03/31/2011  . Arthritis   . CAD (coronary artery disease),CABG 1993-LIMA to the LAD, SVG to Om and SVG to PDA/PLA, patent on cath 2008. 2003   a. s/p CABG 2003. b. last cath 2008 with patent grafts.  . Carotid artery disease ([place])    a. Severe - pt refused intervention in the past.  . Chronic atrial fibrillation ([place])    permanent  . Chronic systolic CHF (congestive heart failure) ([place])    a. Prior low EF, later normalized.  . Essential hypertension   . GERD (gastroesophageal reflux disease)   . Heart attack ([place]) 1980's  . Heart murmur    . Hyperlipidemia   . Ischemic cardiomyopathy   . Kidney stones    "passed all but one time when he had to have lithotripsy" (01/21/2013)  . Pulmonary hypertension ([place])   . 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 07/2015 - after holding Xarelto x 3 days for spinal procedure.  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 SSS (sick sinus syndrome) ([place])   . Stroke [facility]) 2007   "slightly drags left foot since; recovered qthing else" (01/21/2013)  . Syncope 07/2015   a. felt vasovagal in setting of pain from renal infarct.    Patient Active Problem List   Diagnosis Date Noted  . Fatigue 08/06/2017  . Symptomatic anemia   . Chronic anemia 01/13/2016  . Chronic renal failure, stage 3 (moderate) ([place]) 08/06/2015  . Vasovagal syncope   . Coronary artery disease involving native coronary artery   . Renal infarction ([place]) 07/31/2015  . Syncope 07/30/2015  . Esophageal stricture   . Carotid stenosis 07/13/2013  . Anticoagulated on Eliquis 03/21/2013  . PVD - 90% LICA, 24% RICA by doppler 5/14 01/24/2013  . 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 CAD (coronary artery disease),CABG 1993-LIMA to the LAD, SVG to Om and SVG to PDA/PLA, patent on cath 2014. 03/31/2011  . Chronic a-fib (  [place]) 03/31/2011  . Cardiomyopathy, ischemic, improved 03/31/2011  . CVA (cerebral vascular accident) ([place]) 03/31/2011  . GERD (gastroesophageal reflux disease) 03/31/2011  . Renal calculi 03/31/2011  . History of tobacco use, continues with chewing tobacco 03/31/2011    Past Surgical History:  Procedure Laterality Date  . BALLOON DILATION N/A 05/22/2015   Procedure: BALLOON DILATION;  Surgeon: Gatha Mayer, MD;  Location: [facility] ENDOSCOPY;  Service: Endoscopy;  Laterality: N/A;  . CARDIAC CATHETERIZATION  02/24/2002   reduced LV function, 60-70% prox RCA stenosis, 70% PLA ostial stenosis, 80% secondary branch of PLA stenosis - subsequent CABG (Dr. Jackie Plum)  . Carotid Doppler  06/2012     50-69% right bulb/prox ICA diameter reduction; 70-99% left bulb/prox ICA diameter reduction  . CATARACT EXTRACTION W/ INTRAOCULAR LENS IMPLANT Bilateral 2013  . COLONOSCOPY N/A 05/18/2016   Procedure: COLONOSCOPY;  Surgeon: Manus Gunning, MD;  Location: [facility] ENDOSCOPY;  Service: Gastroenterology;  Laterality: N/A;  . CORONARY ANGIOPLASTY  07/05/2006   3 vessel CAD, patent LIMA to LAD, patentVG to OM, patent SVG to PDA & PLA, mild MR, severe LV systolic dysfunction (Dr. Gerrie Nordmann)  . CORONARY ARTERY BYPASS GRAFT  03/01/2002   LIMA to LAD, reverse SVG to OM, reverse SVG to PDA of RCA, reverse SVG to PLA of RCA, ligation of LA appendage (Dr. Servando Snare)  . ENTEROSCOPY N/A 05/20/2016   Procedure: ENTEROSCOPY;  Surgeon: Manus Gunning, MD;  Location: [place] [facility];  Service: Gastroenterology;  Laterality: N/A;  . ESOPHAGOGASTRODUODENOSCOPY  02/01/2012   Procedure: ESOPHAGOGASTRODUODENOSCOPY (EGD);  Surgeon: Beryle Beams, MD;  Location: [facility];  Service: Endoscopy;  Laterality: N/A;  . ESOPHAGOGASTRODUODENOSCOPY N/A 05/22/2015   Procedure: ESOPHAGOGASTRODUODENOSCOPY (EGD);  Surgeon: Gatha Mayer, MD;  Location: [facility];  Service: Endoscopy;  Laterality: N/A;  . ESOPHAGOGASTRODUODENOSCOPY N/A 05/17/2016   Procedure: ESOPHAGOGASTRODUODENOSCOPY (EGD);  Surgeon: Juanita Craver, MD;  Location: [facility] ENDOSCOPY;  Service: Endoscopy;  Laterality: N/A;  . GIVENS CAPSULE STUDY N/A 05/18/2016   Procedure: GIVENS CAPSULE STUDY;  Surgeon: Manus Gunning, MD;  Location: [facility];  Service: Gastroenterology;  Laterality: N/A;  . LAPAROSCOPIC APPENDECTOMY  03/30/2011   Procedure: APPENDECTOMY LAPAROSCOPIC;  Surgeon: Joyice Faster. Cornett, MD;  Location: [facility];  Service: General;  Laterality: N/A;  . LEFT HEART CATHETERIZATION WITH CORONARY ANGIOGRAM N/A 01/24/2013   Procedure: LEFT HEART CATHETERIZATION WITH CORONARY ANGIOGRAM;  Surgeon: [name], MD;  Location: [facility] CATH LAB;  Service:  Cardiovascular;  Laterality: N/A;  Right heart with grafts  . LITHOTRIPSY     "once" (01/21/2013)  . NM MYOCAR PERF WALL MOTION  05/2009   persantine myoview - fixed moderate perfusion defect in inferior wall & lateral segment of apex (poor non-transmural infarction), minimal anterolateral periinfarct reversible ischemia seen, abnormal study, defects similar to 2006 study  . SPLENECTOMY, TOTAL  01/2013  . TEE WITHOUT CARDIOVERSION N/A 01/23/2013   Procedure: TRANSESOPHAGEAL ECHOCARDIOGRAM (TEE);  Surgeon: Sanda Klein, MD;  Location: [facility] ENDOSCOPY;  Service: Cardiovascular;  Laterality: N/A;  . TRANSTHORACIC ECHOCARDIOGRAM  06/23/2012   EF 40-45%, mild LVH; mild AV regurg; mild MV regurg; LV mod-severely dilated; RV mildly dilated; systolic pressure borderline increased; RA mod dilated        Home Medications    Prior to Admission medications   Medication Sig Start Date End Date Taking? Authorizing Provider  apixaban (ELIQUIS) 2.5 MG TABS tablet Take 1 tablet (2.5 mg total) by mouth 2 (two) times daily. FOLLOW UP WITH CARDIOLOGY PRIOR TO  NEXT REFILL AUTHORIZATION 08/03/17   Troy Sine, MD  carvedilol (COREG) 3.125 MG tablet TAKE 1 TABLET(3.125 MG) BY MOUTH TWICE DAILY WITH A MEAL 08/25/17   Almyra Deforest, PA  furosemide (LASIX) 40 MG tablet TAKE 1/2 TABLET(20 MG) BY MOUTH DAILY 08/25/17   Almyra Deforest, PA  ipratropium (ATROVENT) 0.03 % nasal spray Place 2 sprays into the nose 4 (four) times daily. 06/09/17   [name], [name], MD  lisinopril (PRINIVIL,ZESTRIL) 5 MG tablet Take 1 tablet (5 mg total) by mouth daily. 08/25/17   Almyra Deforest, PA  pantoprazole (PROTONIX) 40 MG tablet Take 1 tablet (40 mg total) by mouth daily. 08/25/17   Almyra Deforest, PA  rOPINIRole (REQUIP) 0.25 MG tablet TAKE 1 TABLET(0.25 MG) BY MOUTH TWICE DAILY 08/25/17   Almyra Deforest, PA  rosuvastatin (CRESTOR) 40 MG tablet TAKE 1 TABLET(40 MG) BY MOUTH DAILY 08/25/17   Almyra Deforest, PA  traMADol (ULTRAM) 50 MG tablet TAKE ONE-HALF TABLET BY  MOUTH EVERY 8 HOURS AS NEEDED FOR PAIN 04/28/17   [name], [name], MD    Family History Family History  Problem Relation Age of Onset  . Heart disease Father        rheumatic fever  . Heart attack Brother 36  . Stroke Mother   . Stroke Brother 26    Social History Social History   Tobacco Use  . Smoking status: Former Smoker    Packs/day: 2.00    Years: 20.00    Pack years: 40.00    Types: Cigarettes    Last attempt to quit: 04/08/1986    Years since quitting: 31.4  . Smokeless tobacco: Current User    Types: Chew  Substance Use Topics  . Alcohol use: No    Alcohol/week: 0.0 oz  . Drug use: No     Allergies   Percocet [oxycodone-acetaminophen]   Review of Systems Review of Systems  Respiratory: Positive for shortness of breath.   All other systems reviewed and are negative.    Physical Exam Updated Vital Signs BP (!) 150/78 (BP Location: Left Arm)   Pulse 98   Temp 97.6 F (36.4 C) (Oral)   Resp (!) 22   SpO2 96%   Physical Exam  Constitutional: He is oriented to person, place, and time. He appears well-developed and well-nourished. No distress.  HENT:  Head: Normocephalic and atraumatic.  Mouth/Throat: Oropharynx is clear and moist.  Eyes: Pupils are equal, round, and reactive to light. Conjunctivae and EOM are normal.  Neck: Normal range of motion. Neck supple.  Cardiovascular: Normal rate and intact distal pulses. An irregularly irregular rhythm present.  Occasional extrasystoles are present.  No murmur heard. Well healed sternotomy scar  Pulmonary/Chest: Effort normal. No accessory muscle usage. Tachypnea noted. No respiratory distress. He has decreased breath sounds in the right lower field and the left lower field. He has no wheezes. He has no rales.  Abdominal: Soft. He exhibits no distension. There is no tenderness. There is no rebound and no guarding.  Musculoskeletal: Normal range of motion. He exhibits no edema or tenderness.    Neurological: He is alert and oriented to person, place, and time.  Skin: Skin is warm and dry. No rash noted. No erythema.  Psychiatric: He has a normal mood and affect. His behavior is normal.  Nursing note and vitals reviewed.    ED Treatments / Results  Labs (all labs ordered are listed, but only abnormal results are displayed) Labs Reviewed  CBC WITH DIFFERENTIAL/PLATELET -  Abnormal; Notable for the following components:      Result Value   RBC 3.62 (*)    Hemoglobin 11.5 (*)    HCT 36.3 (*)    MCV 100.3 (*)    RDW 15.9 (*)    All other components within normal limits  COMPREHENSIVE METABOLIC PANEL - Abnormal; Notable for the following components:   Glucose, Bld 124 (*)    Creatinine, Ser 1.58 (*)    Calcium 8.6 (*)    Albumin 3.3 (*)    GFR calc non Af Amer 39 (*)    GFR calc Af Amer 45 (*)    All other components within normal limits  BRAIN NATRIURETIC PEPTIDE - Abnormal; Notable for the following components:   B Natriuretic Peptide 413.7 (*)    All other components within normal limits  I-STAT TROPONIN, ED  I-STAT VENOUS BLOOD GAS, ED    EKG EKG Interpretation  Date/Time:  Tuesday September 14 2017 09:14:39 EDT Ventricular Rate:  101 PR Interval:    QRS Duration: 91 QT Interval:  370 QTC Calculation: 438 R Axis:   153 Text Interpretation:  Atrial fibrillation new Paired ventricular premature complexes Anterior infarct, old Confirmed by Blanchie Dessert [phone]) on 09/14/2017 9:21:54 AM   Radiology Dg Chest Port 1 View  Result Date: 09/14/2017 CLINICAL DATA:  Shortness of breath. EXAM: PORTABLE CHEST 1 VIEW COMPARISON:  Radiographs of May 15, 2016. FINDINGS: Stable cardiomegaly. Atherosclerosis of thoracic aorta is noted. Status post coronary artery bypass graft. No pneumothorax or pleural effusion is noted. Increased right perihilar and basilar lung opacities are noted concerning for edema or possibly inflammation. Bony thorax is unremarkable. IMPRESSION:  Increased right perihilar and basilar lung opacities are noted concerning for pulmonary edema or possibly pneumonia. Electronically Signed   By: Marijo Conception, M.D.   On: 09/14/2017 09:55    Procedures Procedures (including critical care time)  Medications Ordered in ED Medications - No data to display   Initial Impression / Assessment and Plan / ED Course  I have reviewed the triage vital signs and the nursing notes.  Pertinent labs & imaging results that were available during my care of the patient were reviewed by me and considered in my medical decision making (see chart for details).    Elderly male presenting today with sudden onset of shortness of breath.  Patient is anticoagulated but has an extensive heart history.  Lower suspicion today for PE as he is anticoagulated however concern for possible ACS versus flash pulmonary edema versus pneumothorax versus PNA vs tamponade.  Patient's EKG shows frequent PVCs as well as atrial fibrillation with low voltage.  Patient is able to speak in complete sentences but does appear short of breath.  Oxygen saturation is between 96 to 100%.  Patient has no prior long history such as asthma or COPD.  He is not a smoker. Chest x-ray, CBC, CMP, troponin, BNP, VBG pending.  If patient's work of breathing does not improve will attempt BiPAP.  10:15 AM Pt's CBC without acute findings, CMP with stable creatinine.  Patient's chest x-ray with findings concerning for pulmonary edema.  On repeat evaluation patient is still feeling winded and is still tachypneic.  When he talks his respiratory rate increases to the 30s.  We will start BiPAP.  Blood gas with compensated respiratory acidosis.  11:08 AM BNP is elevated to almost 500 and troponin is within normal limits.  Patient was given IV Lasix.  Patient is more comfortable on BiPAP.  Will admit for further care.  Patient's last echo was in 2017 and at that time he had an EF of 55 to 60%.  CRITICAL  CARE Performed by: Sabrina Keough Total critical care time: 30 minutes Critical care time was exclusive of separately billable procedures and treating other patients. Critical care was necessary to treat or prevent imminent or life-threatening deterioration. Critical care was time spent personally by me on the following activities: development of treatment plan with patient and/or surrogate as well as nursing, discussions with consultants, evaluation of patient's response to treatment, examination of patient, obtaining history from patient or surrogate, ordering and performing treatments and interventions, ordering and review of laboratory studies, ordering and review of radiographic studies, pulse oximetry and re-evaluation of patient's condition.   Final Clinical Impressions(s) / ED Diagnoses   Final diagnoses:  Acute congestive heart failure, unspecified heart failure type [facility])    ED Discharge Orders    None       Blanchie Dessert, MD 09/14/17 1109

## 2017-09-14 NOTE — ED Triage Notes (Signed)
Pt arrived via [facility]; per EMS pt from [facility] w/ c/o SOB; pt stated that he had been up for about an hr and couldn't catch breathe; stated that pain worse upon moving but denies c/o pn n/v/d; 88% on RA; placed on 4L (doesn't wear O2 at hm); pt stated that he felt better with O2; Hx of multiple MI, AFiB, CHF, had CABG (2003) ; 40-90 with PVCs and bigemany; 156/82; 40-90, 96% on 4L

## 2017-09-14 NOTE — Plan of Care (Signed)
  Problem: Health Behavior/Discharge Planning: Goal: Ability to manage health-related needs will improve Outcome: Progressing   Problem: Clinical Measurements: Goal: Respiratory complications will improve Outcome: Progressing   Problem: Activity: Goal: Risk for activity intolerance will decrease Outcome: Progressing   Problem: Pain Managment: Goal: General experience of comfort will improve Outcome: Progressing   Problem: Safety: Goal: Ability to remain free from injury will improve Outcome: Progressing   

## 2017-09-14 NOTE — ED Notes (Signed)
Patient transported to Ultrasound for Echocardiogram.

## 2017-09-14 NOTE — ED Notes (Signed)
Pt not tolerating Bipap, placed on 4L per NP. Sats 100%, nonlabored at this time. Blinds opened for better visual from nurses' desk.

## 2017-09-14 NOTE — ED Notes (Signed)
ED Provider at bedside. 

## 2017-09-14 NOTE — Progress Notes (Signed)
2D Echocardiogram has been performed.  09/14/2017, 3:12 PM

## 2017-09-14 NOTE — H&P (Signed)
History and Physical    Gregory [name] WVP:[id number] DOB: [birth year] DOA: 09/14/2017  PCP: Darreld Mclean, MD Patient coming from: home  Chief Complaint: sob  HPI: Gregory [name] is a very pleasant 84 y.o. 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 Initial evaluation reveals respiratory failure likely related to acute on chronic systolic heart failure. [facility] were asked to admit  Information is obtained from the patient and his wife who is at the bedside. Patient states he was in his usual state of health until this morning he awakened with sudden onset shortness of breath. He states yesterday he spent most of the day outdoors working in his yard wife believes he became overheated. This morning he awakened with worsening shortness of breath and EMS was called. Reportedly his oxygen saturation level was 88% on room air. He was provided oxygen supplementation and oxygen saturation level improved. He also reports that over the last week he's had a mild nonproductive cough. He took Mucinex with little relief. He states this morning activity and walking makes his shortness of breath worse. He denies chest pain palpitations lower extremity edema. He denies headache dizziness syncope or near-syncope. Denies abdominal pain nausea vomiting diarrhea constipation melena bright red blood per rectum. He denies any dysuria hematuria frequency or urgency. He reports compliance with all his medications and does note that his Lasix dose was decreased approximately a year ago. He does not weigh every day.    ED Course: in the emergency department he is on 4 L nasal cannula with an oxygen  saturation level greater than 90% but remained quite tachypnea with increased work of breathing. He is placed on BiPAP became more comfortable. He is provided with 40 mg of Lasix IV. At the time of admission he is hemodynamically stable and has an oxygen saturation level greater than 90% on BiPAP. Has voided 600 mL  Review of Systems: As per HPI otherwise all other systems reviewed and are negative.   Ambulatory Status: relates independently is independent with ADLs  Past Medical History:  Diagnosis Date  . Acute respiratory failure ([place])   . Appendicitis with abscess 03/31/2011  . Arthritis   . CAD (coronary artery disease),CABG 1993-LIMA to the LAD, SVG to Om and SVG to PDA/PLA, patent on cath 2008. 2003   a. s/p CABG 2003. b. last cath 2008 with patent grafts.  . Chronic atrial fibrillation (HCC)    permanent  . Chronic systolic CHF (congestive heart failure) ([place])    a. Prior low EF, later normalized.  . Essential hypertension   . GERD (gastroesophageal reflux disease)   . Heart murmur   . Hyperlipidemia   . Ischemic cardiomyopathy   . Kidney stones    "passed all but one time when he had to have lithotripsy" (01/21/2013)  . Pulmonary hypertension ([place])   . 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 07/2015 - after holding Xarelto x 3 days for spinal procedure.  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 SSS (sick sinus syndrome) ([place])   . Stroke [facility]) 2007   "slightly drags left foot since; recovered qthing else" (01/21/2013)  . Syncope 07/2015   a. felt vasovagal in setting of pain from renal infarct.    Past Surgical History:  Procedure Laterality Date  . BALLOON DILATION N/A 05/22/2015   Procedure: BALLOON DILATION;  Surgeon: Gatha Mayer, MD;  Location: [facility] ENDOSCOPY;  Service: Endoscopy;  Laterality: N/A;  . CARDIAC CATHETERIZATION  02/24/2002   reduced LV function, 60-70% prox RCA stenosis, 70% PLA ostial stenosis, 80% secondary branch of PLA stenosis - subsequent CABG (Dr. Jackie Plum)  . Carotid  Doppler  06/2012   50-69% right bulb/prox ICA diameter reduction; 70-99% left bulb/prox ICA diameter reduction  . CATARACT EXTRACTION W/ INTRAOCULAR LENS IMPLANT Bilateral 2013  . COLONOSCOPY N/A 05/18/2016   Procedure: COLONOSCOPY;  Surgeon: Manus Gunning, MD;  Location: [facility] ENDOSCOPY;  Service: Gastroenterology;  Laterality: N/A;  . CORONARY ANGIOPLASTY  07/05/2006   3 vessel CAD, patent LIMA to LAD, patentVG to OM, patent SVG to PDA & PLA, mild MR, severe LV systolic dysfunction (Dr. Gerrie Nordmann)  . CORONARY ARTERY BYPASS GRAFT  03/01/2002   LIMA to LAD, reverse SVG to OM, reverse SVG to PDA of RCA, reverse SVG to PLA of RCA, ligation of LA appendage (Dr. Servando Snare)  . ENTEROSCOPY N/A 05/20/2016   Procedure: ENTEROSCOPY;  Surgeon: Manus Gunning, MD;  Location: [place];  Service: Gastroenterology;  Laterality: N/A;  . ESOPHAGOGASTRODUODENOSCOPY  02/01/2012   Procedure: ESOPHAGOGASTRODUODENOSCOPY (EGD);  Surgeon: Beryle Beams, MD;  Location: [facility];  Service: Endoscopy;  Laterality: N/A;  . ESOPHAGOGASTRODUODENOSCOPY N/A 05/22/2015   Procedure: ESOPHAGOGASTRODUODENOSCOPY (EGD);  Surgeon: Gatha Mayer, MD;  Location: [facility] ENDOSCOPY;  Service: Endoscopy;  Laterality: N/A;  . ESOPHAGOGASTRODUODENOSCOPY N/A 05/17/2016   Procedure: ESOPHAGOGASTRODUODENOSCOPY (EGD);  Surgeon: Juanita Craver, MD;  Location: [facility] ENDOSCOPY;  Service: Endoscopy;  Laterality: N/A;  . GIVENS CAPSULE STUDY N/A 05/18/2016   Procedure: GIVENS CAPSULE STUDY;  Surgeon: Manus Gunning, MD;  Location: [place];  Service: Gastroenterology;  Laterality: N/A;  . LAPAROSCOPIC APPENDECTOMY  03/30/2011   Procedure: APPENDECTOMY LAPAROSCOPIC;  Surgeon: Joyice Faster. Cornett, MD;  Location: [place];  Service: General;  Laterality: N/A;  . LEFT HEART CATHETERIZATION WITH CORONARY ANGIOGRAM N/A 01/24/2013   Procedure: LEFT HEART CATHETERIZATION WITH CORONARY ANGIOGRAM;  Surgeon: [name], MD;  Location: [facility] CATH  LAB;  Service: Cardiovascular;  Laterality: N/A;  Right heart with grafts  . LITHOTRIPSY     "once" (01/21/2013)  . NM MYOCAR PERF WALL MOTION  05/2009   persantine myoview - fixed moderate perfusion defect in inferior wall & lateral segment of apex (poor non-transmural infarction), minimal anterolateral periinfarct reversible ischemia seen, abnormal study, defects similar to 2006 study  . SPLENECTOMY, TOTAL  01/2013  . TEE WITHOUT CARDIOVERSION N/A 01/23/2013   Procedure: TRANSESOPHAGEAL ECHOCARDIOGRAM (TEE);  Surgeon: Sanda Klein, MD;  Location: [facility] ENDOSCOPY;  Service: Cardiovascular;  Laterality: N/A;  . TRANSTHORACIC ECHOCARDIOGRAM  06/23/2012   EF 40-45%, mild LVH; mild AV regurg; mild MV regurg; LV mod-severely dilated; RV mildly dilated; systolic pressure borderline increased; RA mod dilated    Social History   Socioeconomic History  . Marital status: Married    Spouse name: Not on file  . Number of children: 9  . Years of education: 8  . Highest education level: Not on file  Occupational History  . Occupation: retired  Scientific laboratory technician  . Financial resource strain: Not on file  . Food insecurity:    Worry: Not on file    Inability: Not on file  . Transportation needs:    Medical: Not on file    Non-medical: Not on file  Tobacco Use  .  Smoking status: Former Smoker    Packs/day: 2.00    Years: 20.00    Pack years: 40.00    Types: Cigarettes    Last attempt to quit: 04/08/1986    Years since quitting: 31.4  . Smokeless tobacco: Current User    Types: Chew  Substance and Sexual Activity  . Alcohol use: No    Alcohol/week: 0.0 oz  . Drug use: No  . Sexual activity: Not on file  Lifestyle  . Physical activity:    Days per week: Not on file    Minutes per session: Not on file  . Stress: Not on file  Relationships  . Social connections:    Talks on phone: Not on file    Gets together: Not on file    Attends religious service: Not on file    Active member of club or  organization: Not on file    Attends meetings of clubs or organizations: Not on file    Relationship status: Not on file  . Intimate partner violence:    Fear of current or ex partner: Not on file    Emotionally abused: Not on file    Physically abused: Not on file    Forced sexual activity: Not on file  Other Topics Concern  . Not on file  Social History Narrative   A she is married, 5 sons 4 daughters. He is retired Horticulturist, commercial. 2-3 caffeinated beverages a day no alcohol    Allergies  Allergen Reactions  . Percocet [Oxycodone-Acetaminophen] Itching and Nausea Only    Family History  Problem Relation Age of Onset  . Heart disease Father        rheumatic fever  . Heart attack Brother 73  . Stroke Mother   . Stroke Brother 45    Prior to Admission medications   Medication Sig Start Date End Date Taking? Authorizing Provider  apixaban (ELIQUIS) 2.5 MG TABS tablet Take 1 tablet (2.5 mg total) by mouth 2 (two) times daily. FOLLOW UP WITH CARDIOLOGY PRIOR TO NEXT REFILL AUTHORIZATION 08/03/17  Yes Troy Sine, MD  carvedilol (COREG) 3.125 MG tablet TAKE 1 TABLET(3.125 MG) BY MOUTH TWICE DAILY WITH A MEAL 08/25/17  Yes Almyra Deforest, PA  fluticasone (FLONASE) 50 MCG/ACT nasal spray Place 2 sprays into both nostrils daily.   Yes [provider]  furosemide (LASIX) 40 MG tablet TAKE 1/2 TABLET(20 MG) BY MOUTH DAILY 08/25/17  Yes Almyra Deforest, PA  guaiFENesin (MUCINEX) 600 MG 12 hr tablet Take 600 mg by mouth 2 (two) times daily as needed for cough or to loosen phlegm.   Yes [provider]  ipratropium (ATROVENT) 0.03 % nasal spray Place 2 sprays into the nose 4 (four) times daily. 06/09/17  Yes [name], [name], MD  lisinopril (PRINIVIL,ZESTRIL) 5 MG tablet Take 1 tablet (5 mg total) by mouth daily. 08/25/17  Yes Almyra Deforest, PA  pantoprazole (PROTONIX) 40 MG tablet Take 1 tablet (40 mg total) by mouth daily. 08/25/17  Yes Meng, Isaac Laud, PA  rOPINIRole (REQUIP) 0.25 MG tablet TAKE  1 TABLET(0.25 MG) BY MOUTH TWICE DAILY 08/25/17  Yes Almyra Deforest, PA  rosuvastatin (CRESTOR) 40 MG tablet TAKE 1 TABLET(40 MG) BY MOUTH DAILY 08/25/17  Yes Almyra Deforest, PA  traMADol (ULTRAM) 50 MG tablet TAKE ONE-HALF TABLET BY MOUTH EVERY 8 HOURS AS NEEDED FOR PAIN 04/28/17  Yes [name], [name], MD    Physical Exam: Vitals:   09/14/17 1022 09/14/17 1030 09/14/17 1100 09/14/17 1130  BP:  139/63 133/60 132/88  Pulse: 86 87 80 90  Resp: 18 (!) 21 (!) 22 (!) 23  Temp:      TempSrc:      SpO2: 100% 99% 100% 99%     General:  Appears calm and comfortable sitting on side of bed Eyes:  PERRL, EOMI, normal lids, iris ENT:  grossly normal hearing, lips & tongue, mucous membranes of his mouth are moist and pink Neck:  no LAD, masses or thyromegaly Cardiovascular:  DOA no m/r/g. No LE edema.  Respiratory:  On BiPAP sitting on side of bed breath sounds are quite diminished bilateral bases I hear no wheezes or crackles Abdomen:  soft, ntnd, +BS no guarding or rebounding Skin:  no rash or induration seen on limited exam Musculoskeletal:  grossly normal tone BUE/BLE, good ROM, no bony abnormality Psychiatric:  grossly normal mood and affect, speech fluent and appropriate, AOx3 Neurologic:  CN 2-12 grossly intact, moves all extremities in coordinated fashion, sensation intact speech clear facial symmetry  Labs on Admission: I have personally reviewed following labs and imaging studies  CBC: Recent Labs  Lab 09/14/17 0931  WBC 8.1  NEUTROABS 5.3  HGB 11.5*  HCT 36.3*  MCV 100.3*  PLT 269   Basic Metabolic Panel: Recent Labs  Lab 09/14/17 0931  NA 141  K 4.4  CL 110  CO2 25  GLUCOSE 124*  BUN 19  CREATININE 1.58*  CALCIUM 8.6*   GFR: CrCl cannot be calculated (Unknown ideal weight.). Liver Function Tests: Recent Labs  Lab 09/14/17 0931  AST 26  ALT 15  ALKPHOS 83  BILITOT 1.2  PROT 7.1  ALBUMIN 3.3*   No results for input(s): LIPASE, AMYLASE in the last 168 hours. No  results for input(s): AMMONIA in the last 168 hours. Coagulation Profile: No results for input(s): INR, PROTIME in the last 168 hours. Cardiac Enzymes: No results for input(s): CKTOTAL, CKMB, CKMBINDEX, TROPONINI in the last 168 hours. BNP (last 3 results) No results for input(s): PROBNP in the last 8760 hours. HbA1C: No results for input(s): HGBA1C in the last 72 hours. CBG: No results for input(s): GLUCAP in the last 168 hours. Lipid Profile: No results for input(s): CHOL, HDL, LDLCALC, TRIG, CHOLHDL, LDLDIRECT in the last 72 hours. Thyroid Function Tests: No results for input(s): TSH, T4TOTAL, FREET4, T3FREE, THYROIDAB in the last 72 hours. Anemia Panel: No results for input(s): VITAMINB12, FOLATE, FERRITIN, TIBC, IRON, RETICCTPCT in the last 72 hours. Urine analysis:    Component Value Date/Time   COLORURINE YELLOW 05/15/2016 [place] 05/15/2016 1631   LABSPEC 1.013 05/15/2016 1631   PHURINE 5.5 05/15/2016 1631   GLUCOSEU NEGATIVE 05/15/2016 1631   HGBUR 1+ (A) 05/15/2016 1631   BILIRUBINUR NEGATIVE 05/15/2016 1631   BILIRUBINUR negative 01/20/2013 1623   KETONESUR NEGATIVE 05/15/2016 1631   PROTEINUR 1+ (A) 05/15/2016 1631   UROBILINOGEN 0.2 01/20/2013 1623   UROBILINOGEN 1.0 03/30/2011 2224   NITRITE NEGATIVE 05/15/2016 1631   LEUKOCYTESUR NEGATIVE 05/15/2016 1631    Creatinine Clearance: CrCl cannot be calculated (Unknown ideal weight.).  Sepsis Labs: @LABRCNTIP (procalcitonin:4,lacticidven:4) )No results found for this or any previous visit (from the past 240 hour(s)).   Radiological Exams on Admission: Dg Chest Port 1 View  Result Date: 09/14/2017 CLINICAL DATA:  Shortness of breath. EXAM: PORTABLE CHEST 1 VIEW COMPARISON:  Radiographs of May 15, 2016. FINDINGS: Stable cardiomegaly. Atherosclerosis of thoracic aorta is noted. Status post coronary artery bypass graft. No pneumothorax or  pleural effusion is noted. Increased right perihilar and  basilar lung opacities are noted concerning for edema or possibly inflammation. Bony thorax is unremarkable. IMPRESSION: Increased right perihilar and basilar lung opacities are noted concerning for pulmonary edema or possibly pneumonia. Electronically Signed   By: Marijo Conception, M.D.   On: 09/14/2017 09:55    EKG: Independently reviewed. Atrial fibrillation new Paired ventricular premature complexes Anterior infarct, old  Assessment/Plan Principal Problem:   Acute respiratory failure (HCC) Active Problems:   CAD (coronary artery disease),CABG 1993-LIMA to the LAD, SVG to Om and SVG to PDA/PLA, patent on cath 2014.   Acute on chronic systolic heart failure (HCC)   Chronic a-fib (HCC)   Anticoagulated on Eliquis   Chronic renal failure, stage 3 (moderate) ([place])   #1. Acute respiratory failure likely secondary to acute on chronic systolic heart failure related to increased activity. Chest x-ray reveals increased right perihilar and basilar lung opacities concerning for pulmonary edema or possible pneumonia. Initial troponin negative BNP 500. He is afebrile with no leukoctyosis EKG reveals afib with new paired PVC. Since saturation level greater than 90% on BiPAP in the emergency department. -Admit to step down -Continue BiPAP -IV Lasix 40 mg IV twice a day -Monitor intake and output -Obtain daily weights -Wean BiPAP as able -cycle troponin  #2. Acute on chronic systolic heart failure. Chart review indicates last cardiac cath in 2008 showed an EF of 20-25%. At that time he declined ICD. His EF improved to 35-40% by 2012 on medical therapy. Additionally in 2014 had a splenic infarct failed due to embolic etiology. Underwent TEE which revealed an ejection fraction around 15-25% with diffuse hypokinesis and severe hypokinesis to akinesis. At that time there was a large solid fixed thrombus occupying the entire atrial appendage with moderate continued spontaneous echo contrast in the left atrial  cavity. At that time patient again refused LifeVest. He was placed on Xarelto. Repeat echo in April 2015 shows significant improvement in LV function to 50-55%. Echocardiogram in 2017 shows an EF of 55-60% with mild aortic insufficiency. Compliant with his medications. Home medications include Lasix and lisinopril and Coreg. Wife reports patient's Lasix dose was decreased approximately a year ago he started well. -Continue Lasix 40 mg IV twice a day -Continue Coreg -Obtain a 2-D echo -Monitor daily weights  #3. Atrial fibrillation. [place] score 5. Rate controlled. Home medications include Eliquis. EKG as noted above. -continue coreg -monitor -continue Eliquis  4.CAD status post CABG, patient denies any chest pain. EKG as noted above. Initial troponin negative. No aspirin as is on request. -continue home meds -trend troponin x1 -supportive therapy  5. Chronic kidney disease stage IV. Creatinine 1.5 on admission. Appears to be a little below his baseline. -Monitor closely given need for IV Lasix -Monitor urine output -hold nephrotoxins as able  #6. Hypertension. Controlled. Home medications include lisinopril Coreg and Lasix as noted above -continue home meds -monitor  7. Bilateral carotid artery disease. Chart review indicates most recent carotid Doppler was in November 2018 which showed significant disease but patient refuses surgical intervention    DVT prophylaxis: eliquis  Code Status: full  Family Communication: wife at bedside  Disposition Plan: home  Consults called: none  Admission status: inpatient    Radene Gunning MD [facility]  If 7PM-7AM, please contact night-coverage [email or web address] Password [facility]  09/14/2017, 12:23 PM

## 2017-09-15 DIAGNOSIS — N184 Chronic kidney disease, stage 4 (severe): Secondary | ICD-10-CM

## 2017-09-15 LAB — BASIC METABOLIC PANEL
ANION GAP: 5 (ref 5–15)
BUN: 23 mg/dL (ref 8–23)
CALCIUM: 8.1 mg/dL — AB (ref 8.9–10.3)
CO2: 29 mmol/L (ref 22–32)
Chloride: 103 mmol/L (ref 98–111)
Creatinine, Ser: 1.67 mg/dL — ABNORMAL HIGH (ref 0.61–1.24)
GFR calc Af Amer: 42 mL/min — ABNORMAL LOW (ref >60)
GFR, EST NON AFRICAN AMERICAN: 36 mL/min — AB (ref >60)
GLUCOSE: 129 mg/dL — AB (ref 70–99)
Potassium: 3.8 mmol/L (ref 3.5–5.1)
Sodium: 137 mmol/L (ref 135–145)

## 2017-09-15 LAB — CBC
HEMATOCRIT: 28.9 % — AB (ref 39.0–52.0)
HEMOGLOBIN: 9.2 g/dL — AB (ref 13.0–17.0)
MCH: 31.4 pg (ref 26.0–34.0)
MCHC: 31.8 g/dL (ref 30.0–36.0)
MCV: 98.6 fL (ref 78.0–100.0)
Platelets: 178 10*3/uL (ref 150–400)
RBC: 2.93 MIL/uL — AB (ref 4.22–5.81)
RDW: 15.7 % — ABNORMAL HIGH (ref 11.5–15.5)
WBC: 6.6 10*3/uL (ref 4.0–10.5)

## 2017-09-15 MED ORDER — ROPINIROLE HCL 0.25 MG PO TABS
0.2500 mg | ORAL_TABLET | Freq: Two times a day (BID) | ORAL | Status: DC
  Administered 2017-09-15 – 2017-09-18 (×6): 0.25 mg via ORAL
  Filled 2017-09-15 (×7): qty 1

## 2017-09-15 MED ORDER — FUROSEMIDE 40 MG PO TABS
40.0000 mg | ORAL_TABLET | Freq: Every day | ORAL | Status: DC
  Administered 2017-09-16: 40 mg via ORAL
  Filled 2017-09-15: qty 1

## 2017-09-15 NOTE — Evaluation (Signed)
Physical Therapy Evaluation Patient Details Name: Gregory [name] MRN: [id number] DOB: 04/21/1932 Today's Date: 09/15/2017   History of Present Illness  Pt is a 84 y.o. M with significant PMH of CAD s/p CABG 2003, chronic atrial fibrillation, carotid artery disease, hypertension, ischemic cardiomyopathy, hyperlipidemia, history of renal infarct, CVA, who presents with sudden onset shortness of breath.   Clinical Impression  Pt admitted with above diagnosis. Pt currently with functional limitations due to the deficits listed below (see PT Problem List). Patient is independent and very active at baseline. Currently ambulating 30 feet with no assistive device with mild balance deficits noted. At rest, HR 56 bpm and increased to 70's with mobility. Decrease in SpO2 from 93% to 89% on RA. Overall, patient seems close to baseline with mobility. Pt will benefit from skilled PT to increase their independence and safety with mobility to allow discharge to the venue listed below.       Follow Up Recommendations No PT follow up;Supervision for mobility/OOB    Equipment Recommendations  None recommended by PT    Recommendations for Other Services       Precautions / Restrictions Precautions Precautions: Fall Precaution Comments: watch HR Restrictions Weight Bearing Restrictions: No      Mobility  Bed Mobility Overal bed mobility: Modified Independent                Transfers Overall transfer level: Modified independent                  Ambulation/Gait Ambulation/Gait assistance: Min guard Gait Distance (Feet): 30 Feet Assistive device: None Gait Pattern/deviations: Shuffle;Trunk flexed;Narrow base of support;Decreased stride length Gait velocity: decreased   General Gait Details: patient with decreased bilateral foot clearance and forward head posture throughout gait but overall no overt LOB. Decrease from 93% to 89% SpO2 on RA; 1L O2 via [place] placed back on at end of  session. HR in 70's for mobility  Stairs            Wheelchair Mobility    Modified Rankin (Stroke Patients Only)       Balance Overall balance assessment: Mild deficits observed, not formally tested                                           Pertinent Vitals/Pain Pain Assessment: No/denies pain    Home Living Family/patient expects to be discharged to:: Private residence Living Arrangements: Spouse/significant other Available Help at Discharge: Family;Available 24 hours/day Type of Home: House Home Access: Stairs to enter Entrance Stairs-Rails: Can reach both Entrance Stairs-Number of Steps: 4 Home Layout: One level Home Equipment: Walker - 2 wheels;Cane - single point      Prior Function Level of Independence: Independent         Comments: 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 Patient daughter states patient was "pouring concrete," just last week.     Hand Dominance        Extremity/Trunk Assessment   Upper Extremity Assessment Upper Extremity Assessment: Overall WFL for tasks assessed    Lower Extremity Assessment Lower Extremity Assessment: Overall WFL for tasks assessed    Cervical / Trunk Assessment Cervical / Trunk Assessment: Kyphotic  Communication   Communication: HOH  Cognition Arousal/Alertness: Awake/alert Behavior During Therapy: WFL for tasks assessed/performed Overall Cognitive Status: Within Functional Limits for tasks assessed  General Comments      Exercises     Assessment/Plan    PT Assessment Patient needs continued PT services  PT Problem List Decreased activity tolerance;Decreased balance;Decreased mobility;Cardiopulmonary status limiting activity       PT Treatment Interventions Gait training;Stair training;Functional mobility training;Therapeutic activities;Therapeutic exercise;Balance training;Patient/family education    PT  Goals (Current goals can be found in the Care Plan section)  Acute Rehab PT Goals Patient Stated Goal: go home PT Goal Formulation: With patient Time For Goal Achievement: 09/29/17 Potential to Achieve Goals: Good    Frequency Min 3X/week   Barriers to discharge        Co-evaluation               AM-PAC PT "6 Clicks" Daily Activity  Outcome Measure Difficulty turning over in bed (including adjusting bedclothes, sheets and blankets)?: None Difficulty moving from lying on back to sitting on the side of the bed? : None Difficulty sitting down on and standing up from a chair with arms (e.g., wheelchair, bedside commode, etc,.)?: None Help needed moving to and from a bed to chair (including a wheelchair)?: A Little Help needed walking in hospital room?: A Little Help needed climbing 3-5 steps with a railing? : A Little 6 Click Score: 21    End of Session Equipment Utilized During Treatment: Gait belt Activity Tolerance: Patient tolerated treatment well Patient left: Other (comment);with call bell/phone within reach;with family/visitor present(sitting EOB) Nurse Communication: Mobility status PT Visit Diagnosis: Unsteadiness on feet (R26.81);Difficulty in walking, not elsewhere classified (R26.2)    Time: 8832-5498 PT Time Calculation (min) (ACUTE ONLY): 18 min   Charges:   PT Evaluation $PT Eval Moderate Complexity: 1 Mod     PT G Codes:        Ellamae Sia, PT, DPT Acute Rehabilitation Services  Pager: [phone]   Willy Eddy 09/15/2017, 5:11 PM

## 2017-09-15 NOTE — Progress Notes (Signed)
Patient and family asking about medication that patient takes at home, NP notified, awaiting orders.

## 2017-09-15 NOTE — Progress Notes (Addendum)
PROGRESS NOTE    Gregory [name]  LGX:[id number] DOB: Jan 05, 1933 DOA: 09/14/2017 PCP: Darreld Mclean, MD    Brief Narrative:  84 year old male who presented with dyspnea.  He does have significant past medical history for coronary artery disease status post CABG, 2003, chronic atrial fibrillation, carotid artery disease, hypertension, ischemic cardiomyopathy, dyslipidemia, and history of CVA.  Patient developed acute dyspnea, severe intensity, oxygen saturation was found to 88% on room air.  Patient was noted to be in respiratory distress, and he was placed on noninvasive mechanical ventilation.  Physical examination blood pressure 139/63, heart rate 87, respirate 23, oxygen saturation 99%, moist mucous membranes, lungs with decreased breath sounds bilaterally, heart is S1-S2 present and rhythmic, tachycardic, abdomen soft nontender, no lower extremity edema.  Sodium 141, potassium 4.4, chloride 110, bicarb 25, glucose 124, BUN 19, creatinine 1.58, BNP 413, troponin 0.08, white cells 8.1, hemoglobin 9.5, hematocrit 36.3, platelets 211, chest x-ray with cardiomegaly, increased interstitial markings bilaterally, peribronchial cuffing.  EKG with right axis deviation, multiple PVCs, atrial fibrillation, low voltage.  Patient was admitted to the hospital with the working diagnosis of acute decompensated heart failure.  Assessment & Plan:   Principal Problem:   Acute respiratory failure (HCC) Active Problems:   CAD (coronary artery disease),CABG 1993-LIMA to the LAD, SVG to Om and SVG to PDA/PLA, patent on cath 2014.   Chronic a-fib (HCC)   Anticoagulated on Eliquis   Chronic renal failure, stage 3 (moderate) (HCC)   Acute on chronic systolic heart failure ([place])   1. Acute on chronic systolic heart failure. Patient has responded well to furosemide, with improvement in his symptoms, will change to po furosemide and continue to follow up response. Continue carvedilol and lisinopril, both agents  were held this am due to hypotension and bradycardia.   2. Chronic atrial fibrillation. Continue rate control with carvedilol and anticoagulation with apixaban.   3. Coronary artery disease. Patient is chest pain free.   4. CKD stage IV. Will continue diuresis with po furosemide in am, patient clinically euvolemic today, renal function with serum cr at 1.6 with K at 3,8.   5. HTN. Will hold on lisinopril for today due to episodic hypotension.   DVT prophylaxis: apixaban   Code Status: full Family Communication: I spoke with patient's wife at the bedside and all questions were addressed.  Disposition Plan: home in am if renal function and blood pressure stable    Consultants:     Procedures:     Antimicrobials:       Subjective: Patient is feeling better, out of bed to the chair. Patient had episode of hypotension and bradycardia, not symptomatic, no nausea or vomiting, no chest pain.   Objective: Vitals:   09/14/17 2348 09/15/17 0538 09/15/17 0831 09/15/17 0854  BP: 106/73 (!) 105/46 (!) 106/40 (!) 109/51  Pulse: (!) 59 60 (!) 56   Resp: 20 20 18    Temp: 98 F (36.7 C) 98.2 F (36.8 C) 98.4 F (36.9 C)   TempSrc: Oral Oral Oral   SpO2: 91% 98% 98%   Weight:  80.5 kg (177 lb 8 oz)    Height:        Intake/Output Summary (Last 24 hours) at 09/15/2017 1043 Last data filed at 09/15/2017 0547 Gross per 24 hour  Intake 1280 ml  Output 1800 ml  Net -520 ml   Filed Weights   09/14/17 1553 09/15/17 0538  Weight: 81.4 kg (179 lb 6.4 oz) 80.5 kg (177 lb 8  oz)    Examination:   General: Not in pain or dyspnea  Neurology: Awake and alert, non focal  E ENT: mild pallor, no icterus, oral mucosa moist Cardiovascular: No JVD. S1-S2 present, rhythmic, no gallops, rubs, or murmurs. +/++  lower extremity edema. Pulmonary: decreased breath sounds bilaterally at bases, adequate air movement, no wheezing, rhonchi or rales. Gastrointestinal. Abdomen with no organomegaly, non  tender, no rebound or guarding Skin. No rashes Musculoskeletal: no joint deformities     Data Reviewed: I have personally reviewed following labs and imaging studies  CBC: Recent Labs  Lab 09/14/17 0931 09/15/17 0641  WBC 8.1 6.6  NEUTROABS 5.3  --   HGB 11.5* 9.2*  HCT 36.3* 28.9*  MCV 100.3* 98.6  PLT 211 291   Basic Metabolic Panel: Recent Labs  Lab 09/14/17 0931 09/15/17 0641  NA 141 137  K 4.4 3.8  CL 110 103  CO2 25 29  GLUCOSE 124* 129*  BUN 19 23  CREATININE 1.58* 1.67*  CALCIUM 8.6* 8.1*   GFR: Estimated Creatinine Clearance: 34.1 mL/min (A) (by C-G formula based on SCr of 1.67 mg/dL (H)). Liver Function Tests: Recent Labs  Lab 09/14/17 0931  AST 26  ALT 15  ALKPHOS 83  BILITOT 1.2  PROT 7.1  ALBUMIN 3.3*   No results for input(s): LIPASE, AMYLASE in the last 168 hours. No results for input(s): AMMONIA in the last 168 hours. Coagulation Profile: No results for input(s): INR, PROTIME in the last 168 hours. Cardiac Enzymes: Recent Labs  Lab 09/14/17 1354  TROPONINI 0.08*   BNP (last 3 results) No results for input(s): PROBNP in the last 8760 hours. HbA1C: No results for input(s): HGBA1C in the last 72 hours. CBG: No results for input(s): GLUCAP in the last 168 hours. Lipid Profile: No results for input(s): CHOL, HDL, LDLCALC, TRIG, CHOLHDL, LDLDIRECT in the last 72 hours. Thyroid Function Tests: No results for input(s): TSH, T4TOTAL, FREET4, T3FREE, THYROIDAB in the last 72 hours. Anemia Panel: No results for input(s): VITAMINB12, FOLATE, FERRITIN, TIBC, IRON, RETICCTPCT in the last 72 hours.    Radiology Studies: I have reviewed all of the imaging during this hospital visit personally     Scheduled Meds: . apixaban  2.5 mg Oral BID  . carvedilol  3.125 mg Oral BID WC  . furosemide  40 mg Intravenous BID  . lisinopril  5 mg Oral Daily  . mouth rinse  15 mL Mouth Rinse BID  . pantoprazole  40 mg Oral Daily  . sodium chloride  flush  3 mL Intravenous Q12H   Continuous Infusions: . sodium chloride       LOS: 1 day        Divante Kotch Gerome Apley, MD [facility] Pager [phone]

## 2017-09-15 NOTE — Progress Notes (Signed)
Dr Cathlean Sauer on the floor, informed of increased frequency of brady periods and agreeable to holding betablocker. Pt also presents hypotensive upon administering morning medications, so lisinopril was held as well. Dr Cathlean Sauer aware. Family educated/informed.

## 2017-09-15 NOTE — Discharge Instructions (Signed)

## 2017-09-15 NOTE — Progress Notes (Signed)
Patient resting comfortably during shift report. Denies complaints.  

## 2017-09-16 DIAGNOSIS — I4891 Unspecified atrial fibrillation: Secondary | ICD-10-CM

## 2017-09-16 DIAGNOSIS — I2510 Atherosclerotic heart disease of native coronary artery without angina pectoris: Secondary | ICD-10-CM

## 2017-09-16 DIAGNOSIS — D649 Anemia, unspecified: Secondary | ICD-10-CM

## 2017-09-16 DIAGNOSIS — I5022 Chronic systolic (congestive) heart failure: Secondary | ICD-10-CM

## 2017-09-16 DIAGNOSIS — I5023 Acute on chronic systolic (congestive) heart failure: Secondary | ICD-10-CM

## 2017-09-16 LAB — TROPONIN I

## 2017-09-16 LAB — BASIC METABOLIC PANEL
ANION GAP: 7 (ref 5–15)
BUN: 25 mg/dL — ABNORMAL HIGH (ref 8–23)
CHLORIDE: 104 mmol/L (ref 98–111)
CO2: 31 mmol/L (ref 22–32)
Calcium: 8.3 mg/dL — ABNORMAL LOW (ref 8.9–10.3)
Creatinine, Ser: 1.62 mg/dL — ABNORMAL HIGH (ref 0.61–1.24)
GFR calc non Af Amer: 38 mL/min — ABNORMAL LOW (ref >60)
GFR, EST AFRICAN AMERICAN: 44 mL/min — AB (ref >60)
Glucose, Bld: 101 mg/dL — ABNORMAL HIGH (ref 70–99)
POTASSIUM: 3.7 mmol/L (ref 3.5–5.1)
SODIUM: 142 mmol/L (ref 135–145)

## 2017-09-16 LAB — HEPARIN LEVEL (UNFRACTIONATED): Heparin Unfractionated: >2.2 IU/mL — ABNORMAL HIGH (ref 0.30–0.70)

## 2017-09-16 LAB — GLUCOSE, CAPILLARY: Glucose-Capillary: 168 mg/dL — ABNORMAL HIGH (ref 70–99)

## 2017-09-16 LAB — PROTIME-INR
INR: 1.04
PROTHROMBIN TIME: 13.5 s (ref 11.4–15.2)

## 2017-09-16 LAB — APTT: APTT: 35 s (ref 24–36)

## 2017-09-16 MED ORDER — SODIUM CHLORIDE 0.9% FLUSH
3.0000 mL | Freq: Two times a day (BID) | INTRAVENOUS | Status: DC
  Administered 2017-09-16: 3 mL via INTRAVENOUS

## 2017-09-16 MED ORDER — GLUCAGON HCL RDNA (DIAGNOSTIC) 1 MG IJ SOLR
1.0000 mg | Freq: Once | INTRAMUSCULAR | Status: AC
  Administered 2017-09-16: 1 mg via INTRAVENOUS
  Filled 2017-09-16: qty 1

## 2017-09-16 MED ORDER — SODIUM CHLORIDE 0.9 % IV SOLN
INTRAVENOUS | Status: DC
  Administered 2017-09-16: 22:00:00 via INTRAVENOUS

## 2017-09-16 MED ORDER — ROSUVASTATIN CALCIUM 40 MG PO TABS
40.0000 mg | ORAL_TABLET | Freq: Every day | ORAL | Status: DC
  Administered 2017-09-17: 40 mg via ORAL
  Filled 2017-09-16 (×2): qty 1

## 2017-09-16 MED ORDER — SODIUM CHLORIDE 0.9 % IV SOLN: 250.0000 mL | INTRAVENOUS | Status: DC | PRN

## 2017-09-16 MED ORDER — HEPARIN (PORCINE) IN NACL 100-0.45 UNIT/ML-% IJ SOLN
1000.0000 [IU]/h | INTRAMUSCULAR | Status: DC
  Administered 2017-09-16: 1000 [IU]/h via INTRAVENOUS
  Filled 2017-09-16: qty 250

## 2017-09-16 MED ORDER — SIMETHICONE 80 MG PO CHEW
80.0000 mg | CHEWABLE_TABLET | Freq: Once | ORAL | Status: AC
  Administered 2017-09-16: 80 mg via ORAL
  Filled 2017-09-16: qty 1

## 2017-09-16 MED ORDER — ASPIRIN EC 81 MG PO TBEC
81.0000 mg | DELAYED_RELEASE_TABLET | Freq: Every day | ORAL | Status: DC
  Administered 2017-09-16: 81 mg via ORAL
  Filled 2017-09-16: qty 1

## 2017-09-16 MED ORDER — SODIUM CHLORIDE 0.9% FLUSH: 3.0000 mL | INTRAVENOUS | Status: DC | PRN

## 2017-09-16 MED ORDER — ASPIRIN 81 MG PO CHEW: 81.0000 mg | CHEWABLE_TABLET | ORAL | Status: DC

## 2017-09-16 NOTE — Progress Notes (Signed)
Talked to family- she has called her sister and sister called the on-call at his MD's office- states Dr. Ardis Hughs is here and has been notified, but needs a consult. RN will notify NP to consult Dr. Ardis Hughs.  Patient has calmed down, but family says it's because he's tired.

## 2017-09-16 NOTE — H&P (View-Only) (Signed)
Cardiology Consultation:   Patient ID: Gregory [name]; [id number]; 08-Apr-1932   Admit date: 09/14/2017 Date of Consult: 09/16/2017  Primary Care Provider: Darreld Mclean, MD Primary Cardiologist: Larene Beach Electrophysiologist:  na   Patient Profile:   Gregory [name] is a 84 y.o. male with a hx of CAD, ICM  who is being seen today for the evaluation of SOB at the request of Dr Cathlean Sauer.  History of Present Illness:   Gregory [name] 84 yo male history of CAD with prior CABG (LIMA to LAD, SVG to OM, sequential SVG to PDA and PLA of RCA by Dr. 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 Regarding his ICM LVEF as low as 20-25% in 2008, improved over time. From notes he previously refused ICD.   Presents with onset of SOB on Tuesday. States recent viral illness he was recovering from, however his SOB got markedly worst on Tuesday morning when he woke up. Denies any chest pain. No LE edema. Has been compliant with meds.    WBC 8.1 Hgb 11.5 Plt 211 K 4.4 Cr 1.6/GFR 39, BNP 413  Trop neg -->0.08--> CXR: pulm edema vs pneumonia Echo LVEF 25-30%, diffuse hypokinesis, akinesis mid apicalanteroseptal, mild to mod MR EKG afib new lateral ST depressions  Past Medical History:  Diagnosis Date  . Acute respiratory failure ([place])   . Appendicitis with abscess 03/31/2011  . Arthritis   . CAD (coronary artery disease),CABG 1993-LIMA to the LAD, SVG to Om and SVG to PDA/PLA, patent on cath 2008. 2003   a. s/p CABG 2003. b. last cath 2008 with patent grafts.  . Chronic atrial fibrillation (HCC)    permanent  . Chronic systolic CHF (congestive heart failure) ([place])    a. Prior low EF, later normalized.  . Essential hypertension   . GERD (gastroesophageal reflux disease)   . Heart murmur   . Hyperlipidemia   . Ischemic cardiomyopathy     . Kidney stones    "passed all but one time when he had to have lithotripsy" (01/21/2013)  . Pulmonary hypertension ([place])   . Renal infarct ([place])    a. 07/2015 - after holding Xarelto x 3 days for spinal procedure.  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 SSS (sick sinus syndrome) ([place])   . Stroke [facility]) 2007   "slightly drags left foot since; recovered qthing else" (01/21/2013)  . Syncope 07/2015   a. felt vasovagal in setting of pain from renal infarct.    Past Surgical History:  Procedure Laterality Date  . BALLOON DILATION N/A 05/22/2015   Procedure: BALLOON DILATION;  Surgeon: Gatha Mayer, MD;  Location: [facility] ENDOSCOPY;  Service: Endoscopy;  Laterality: N/A;  . CARDIAC CATHETERIZATION  02/24/2002   reduced LV function, 60-70% prox RCA stenosis, 70% PLA ostial stenosis, 80% secondary Barnie Sopko of PLA stenosis - subsequent CABG (Dr. Jackie Plum)  . Carotid Doppler  06/2012   50-69% right bulb/prox ICA diameter reduction; 70-99% left bulb/prox ICA diameter reduction  . CATARACT EXTRACTION W/ INTRAOCULAR LENS IMPLANT Bilateral 2013  . COLONOSCOPY N/A 05/18/2016   Procedure: COLONOSCOPY;  Surgeon: Manus Gunning, MD;  Location: [facility] ENDOSCOPY;  Service: Gastroenterology;  Laterality: N/A;  . CORONARY ANGIOPLASTY  07/05/2006   3 vessel CAD, patent LIMA to LAD, patentVG to OM, patent SVG to PDA & PLA, mild MR, severe LV systolic dysfunction (Dr. Alfonso Patten.  [place])  . CORONARY ARTERY BYPASS GRAFT  03/01/2002   LIMA to LAD, reverse SVG to OM, reverse SVG to PDA of RCA, reverse SVG to PLA of RCA, ligation of LA appendage (Dr. Servando Snare)  . ENTEROSCOPY N/A 05/20/2016   Procedure: ENTEROSCOPY;  Surgeon: Manus Gunning, MD;  Location: [place];  Service: Gastroenterology;  Laterality: N/A;  . ESOPHAGOGASTRODUODENOSCOPY  02/01/2012   Procedure: ESOPHAGOGASTRODUODENOSCOPY (EGD);  Surgeon: Beryle Beams, MD;  Location: [facility];  Service: Endoscopy;  Laterality: N/A;  . ESOPHAGOGASTRODUODENOSCOPY N/A  05/22/2015   Procedure: ESOPHAGOGASTRODUODENOSCOPY (EGD);  Surgeon: Gatha Mayer, MD;  Location: [facility];  Service: Endoscopy;  Laterality: N/A;  . ESOPHAGOGASTRODUODENOSCOPY N/A 05/17/2016   Procedure: ESOPHAGOGASTRODUODENOSCOPY (EGD);  Surgeon: Juanita Craver, MD;  Location: [facility]COPY;  Service: Endoscopy;  Laterality: N/A;  . GIVENS CAPSULE STUDY N/A 05/18/2016   Procedure: GIVENS CAPSULE STUDY;  Surgeon: Manus Gunning, MD;  Location: [place];  Service: Gastroenterology;  Laterality: N/A;  . LAPAROSCOPIC APPENDECTOMY  03/30/2011   Procedure: APPENDECTOMY LAPAROSCOPIC;  Surgeon: Joyice Faster. Cornett, MD;  Location: [place];  Service: General;  Laterality: N/A;  . LEFT HEART CATHETERIZATION WITH CORONARY ANGIOGRAM N/A 01/24/2013   Procedure: LEFT HEART CATHETERIZATION WITH CORONARY ANGIOGRAM;  Surgeon: [name], MD;  Location: [facility] CATH LAB;  Service: Cardiovascular;  Laterality: N/A;  Right heart with grafts  . LITHOTRIPSY     "once" (01/21/2013)  . NM MYOCAR PERF WALL MOTION  05/2009   persantine myoview - fixed moderate perfusion defect in inferior wall & lateral segment of apex (poor non-transmural infarction), minimal anterolateral periinfarct reversible ischemia seen, abnormal study, defects similar to 2006 study  . SPLENECTOMY, TOTAL  01/2013  . TEE WITHOUT CARDIOVERSION N/A 01/23/2013   Procedure: TRANSESOPHAGEAL ECHOCARDIOGRAM (TEE);  Surgeon: Sanda Klein, MD;  Location: [facility] ENDOSCOPY;  Service: Cardiovascular;  Laterality: N/A;  . TRANSTHORACIC ECHOCARDIOGRAM  06/23/2012   EF 40-45%, mild LVH; mild AV regurg; mild MV regurg; LV mod-severely dilated; RV mildly dilated; systolic pressure borderline increased; RA mod dilated      Inpatient Medications: Scheduled Meds: . apixaban  2.5 mg Oral BID  . carvedilol  3.125 mg Oral BID WC  . furosemide  40 mg Oral Daily  . lisinopril  5 mg Oral Daily  . mouth rinse  15 mL Mouth Rinse BID  . pantoprazole  40 mg Oral Daily    . rOPINIRole  0.25 mg Oral BID  . rosuvastatin  40 mg Oral q1800  . sodium chloride flush  3 mL Intravenous Q12H   Continuous Infusions: . sodium chloride     PRN Meds: sodium chloride, acetaminophen, ondansetron (ZOFRAN) IV, sodium chloride flush, traMADol  Allergies:    Allergies  Allergen Reactions  . Percocet [Oxycodone-Acetaminophen] Itching and Nausea Only    Social History:   Social History   Socioeconomic History  . Marital status: Married    Spouse name: Not on file  . Number of children: 9  . Years of education: 8  . Highest education level: Not on file  Occupational History  . Occupation: retired  Scientific laboratory technician  . Financial resource strain: Not on file  . Food insecurity:    Worry: Not on file    Inability: Not on file  . Transportation needs:    Medical: Not on file    Non-medical: Not on file  Tobacco Use  . Smoking status: Former Smoker    Packs/day: 2.00    Years: 20.00  Pack years: 40.00    Types: Cigarettes    Last attempt to quit: 04/08/1986    Years since quitting: 31.4  . Smokeless tobacco: Current User    Types: Chew  Substance and Sexual Activity  . Alcohol use: No    Alcohol/week: 0.0 oz  . Drug use: No  . Sexual activity: Not on file  Lifestyle  . Physical activity:    Days per week: Not on file    Minutes per session: Not on file  . Stress: Not on file  Relationships  . Social connections:    Talks on phone: Not on file    Gets together: Not on file    Attends religious service: Not on file    Active member of club or organization: Not on file    Attends meetings of clubs or organizations: Not on file    Relationship status: Not on file  . Intimate partner violence:    Fear of current or ex partner: Not on file    Emotionally abused: Not on file    Physically abused: Not on file    Forced sexual activity: Not on file  Other Topics Concern  . Not on file  Social History Narrative   A she is married, 5 sons 4 daughters.  He is retired Horticulturist, commercial. 2-3 caffeinated beverages a day no alcohol    Family History:    Family History  Problem Relation Age of Onset  . Heart disease Father        rheumatic fever  . Heart attack Brother 21  . Stroke Mother   . Stroke Brother 48     ROS:  Please see the history of present illness.  All other ROS reviewed and negative.     Physical Exam/Data:   Vitals:   09/16/17 0517 09/16/17 0517 09/16/17 0742 09/16/17 1105  BP: (!) 113/50  (!) 117/59 (!) 113/53  Pulse: 63  67 63  Resp: 16  18 18   Temp:  98.4 F (36.9 C) 98.6 F (37 C) 98.6 F (37 C)  TempSrc:  Oral Oral Oral  SpO2: 98%  97% 99%  Weight:      Height:        Intake/Output Summary (Last 24 hours) at 09/16/2017 1232 Last data filed at 09/16/2017 0923 Gross per 24 hour  Intake 483 ml  Output 825 ml  Net -342 ml   Filed Weights   09/14/17 1553 09/15/17 0538 09/16/17 0300  Weight: 179 lb 6.4 oz (81.4 kg) 177 lb 8 oz (80.5 kg) 179 lb 8 oz (81.4 kg)   Body mass index is 28.11 kg/m.  General:  Well nourished, well developed, in no acute distress HEENT: normal Lymph: no adenopathy Neck: elevated JVD Endocrine:  No thryomegaly  Cardiac:  normal S1, S2; RRR; no murmur  Lungs:  clear to auscultation bilaterally, no wheezing, rhonchi or rales  Abd: soft, nontender, no hepatomegaly  Ext: no edema Musculoskeletal:  No deformities, BUE and BLE strength normal and equal Skin: warm and dry  Neuro:  CNs 2-12 intact, no focal abnormalities noted Psych:  Normal affect     Laboratory Data:  Chemistry Recent Labs  Lab 09/14/17 0931 09/15/17 0641 09/16/17 0616  NA 141 137 142  K 4.4 3.8 3.7  CL 110 103 104  CO2 25 29 31   GLUCOSE 124* 129* 101*  BUN 19 23 25*  CREATININE 1.58* 1.67* 1.62*  CALCIUM 8.6* 8.1* 8.3*  GFRNONAA 39* 36* 38*  GFRAA  45* 42* 44*  ANIONGAP 6 5 7     Recent Labs  Lab 09/14/17 0931  PROT 7.1  ALBUMIN 3.3*  AST 26  ALT 15  ALKPHOS 83  BILITOT 1.2    Hematology Recent Labs  Lab 09/14/17 0931 09/15/17 0641  WBC 8.1 6.6  RBC 3.62* 2.93*  HGB 11.5* 9.2*  HCT 36.3* 28.9*  MCV 100.3* 98.6  MCH 31.8 31.4  MCHC 31.7 31.8  RDW 15.9* 15.7*  PLT 211 178   Cardiac Enzymes Recent Labs  Lab 09/14/17 1354  TROPONINI 0.08*    Recent Labs  Lab 09/14/17 0938  TROPIPOC 0.01    BNP Recent Labs  Lab 09/14/17 0931  BNP 413.7*    DDimer No results for input(s): DDIMER in the last 168 hours.  Radiology/Studies:  Dg Chest Port 1 View  Result Date: 09/14/2017 CLINICAL DATA:  Shortness of breath. EXAM: PORTABLE CHEST 1 VIEW COMPARISON:  Radiographs of May 15, 2016. FINDINGS: Stable cardiomegaly. Atherosclerosis of thoracic aorta is noted. Status post coronary artery bypass graft. No pneumothorax or pleural effusion is noted. Increased right perihilar and basilar lung opacities are noted concerning for edema or possibly inflammation. Bony thorax is unremarkable. IMPRESSION: Increased right perihilar and basilar lung opacities are noted concerning for pulmonary edema or possibly pneumonia. Electronically Signed   By: Marijo Conception, M.D.   On: 09/14/2017 09:55    Assessment and Plan:   1. Acute on chronic sysotlic HF - history of ICM, severe LV dysfunction that had recovered as was normal as recent as 2017 - drop in LVEF from normal to 25-30% with new WMA in patient with known CAD this admission - clininc notes lisinopril lowered to 5mg  due to hypotension. Beta blocker limited due to bradycardia  - concern for recurrent LV dysfunction due to progression of CAD - we discussed a LHC/RHC in detail. He is willing to proceed, he understands the increased risk of contrast nephropathy given his renal function. - hold eliquis, start ASA and hep gtt.  - appears volume overloaded, with borderline renal function and cath tomorrow hold on aggressive diuretics today. F/u filling pressures tomorrow.   - will arrange LHC/RHC. Follow Cr and Hgb  tomorrow AM prior to procedure.    2. History of arterial emboli Multiple prior events, including when just holding xarelto for procedure - would need bridging for any interruption of anticoag - currently on eliquis  -must be bridged off anticoag, will stop eliquis and start hep gtt. Last eliquis dose 9AM 09/16/17.    3. CAD - known history of disease, prior CABG - acute drop in LVEF, new deep lateral TWIs. Mild trop thus far. No chest pain - plan for LHC/RHC   4. Permanent afib - rate control with coreg, has had prior issues with bradycardia. Notes indicate history of sick sinus. Brady on tele at times, no symptoms. In setting of systolic dysfunction and CAD with possible ACS continue beta blocker at this time.  - on eliquis, hold for cath. Bridge with hep due to prior arterial emboli.   5. Stage IV CKD - baseline Cr 1.6-2   6. Anemia - Hgb from 11.5 on admit to 9.2 - repeat cbc tomorrow   I have reviewed the risks, indications, and alternatives to cardiac catheterization, possible angioplasty, and stenting with the patient and his family today. Risks include but are not limited to bleeding, infection, vascular injury, stroke, myocardial infection, arrhythmia, kidney injury, radiation-related injury in the case of prolonged fluoroscopy  use, emergency cardiac surgery, and death. The patient understands the risks of serious complication is 1-2 in 5093 with diagnostic cardiac cath and 1-2% or less with angioplasty/stenting.    For questions or updates, please contact [place] Please consult [email or web address] for contact info under Cardiology/STEMI.   Merrily Pew, MD  09/16/2017 12:32 PM

## 2017-09-16 NOTE — H&P (View-Only) (Signed)
[facility] Referring Provider: [facility] hospitalist Primary Care Physician:  Darreld Mclean, MD Primary Gastroenterologist:  Dr.Gessner Reason for Consultation:  Food impaction in esophagus   HPI:  Gregory [name] is a 84 y.o. male with h/o esophageal stricture that has required dilation in the past, has had overt food impaction in the past. Ate some steak today around 1pm and feels some is still stuck in his esophagus after glucagon, simethecone trials, vomiting some of it out.  He has no chest pain but his throat is a bit sore from the vomiting.  He cannot swallow his spit, has a trash can at the bedside that he has been spitting into.  His daughter called me and then I was called by [facility] hospitalist and came to evaluate him on 3E   Past Medical History:  Diagnosis Date  . Acute respiratory failure ([place])   . Appendicitis with abscess 03/31/2011  . Arthritis   . CAD (coronary artery disease),CABG 1993-LIMA to the LAD, SVG to Om and SVG to PDA/PLA, patent on cath 2008. 2003   a. s/p CABG 2003. b. last cath 2008 with patent grafts.  . Chronic atrial fibrillation (H[facility])    permanent  . Chronic systolic CHF (congestive heart failure) ([place])    a. Prior low EF, later normalized.  . Essential hypertension   . GERD (gastroesophageal reflux disease)   . Heart murmur   . Hyperlipidemia   . Ischemic cardiomyopathy   . Kidney stones    "passed all but one time when he had to have lithotripsy" (01/21/2013)  . Pulmonary hypertension ([place])   . 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 07/2015 - after holding Xarelto x 3 days for spinal procedure.  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 SSS (sick sinus syndrome) ([place])   . Stroke [facility]) 2007   "slightly drags left foot since; recovered qthing else" (01/21/2013)  . Syncope 07/2015   a. felt vasovagal in setting of pain from renal infarct.    Past Surgical History:  Procedure Laterality Date  . BALLOON DILATION N/A 05/22/2015   Procedure: BALLOON  DILATION;  Surgeon: Gatha Mayer, MD;  Location: [facility] ENDOSCOPY;  Service: Endoscopy;  Laterality: N/A;  . CARDIAC CATHETERIZATION  02/24/2002   reduced LV function, 60-70% prox RCA stenosis, 70% PLA ostial stenosis, 80% secondary branch of PLA stenosis - subsequent CABG (Dr. Jackie Plum)  . Carotid Doppler  06/2012   50-69% right bulb/prox ICA diameter reduction; 70-99% left bulb/prox ICA diameter reduction  . CATARACT EXTRACTION W/ INTRAOCULAR LENS IMPLANT Bilateral 2013  . COLONOSCOPY N/A 05/18/2016   Procedure: COLONOSCOPY;  Surgeon: Manus Gunning, MD;  Location: [facility] ENDOSCOPY;  Service: Gastroenterology;  Laterality: N/A;  . CORONARY ANGIOPLASTY  07/05/2006   3 vessel CAD, patent LIMA to LAD, patentVG to OM, patent SVG to PDA & PLA, mild MR, severe LV systolic dysfunction (Dr. Gerrie Nordmann)  . CORONARY ARTERY BYPASS GRAFT  03/01/2002   LIMA to LAD, reverse SVG to OM, reverse SVG to PDA of RCA, reverse SVG to PLA of RCA, ligation of LA appendage (Dr. Servando Snare)  . ENTEROSCOPY N/A 05/20/2016   Procedure: ENTEROSCOPY;  Surgeon: Manus Gunning, MD;  Location: [facility];  Service: Gastroenterology;  Laterality: N/A;  . ESOPHAGOGASTRODUODENOSCOPY  02/01/2012   Procedure: ESOPHAGOGASTRODUODENOSCOPY (EGD);  Surgeon: Beryle Beams, MD;  Location: [facility];  Service: Endoscopy;  Laterality: N/A;  . ESOPHAGOGASTRODUODENOSCOPY N/A 05/22/2015   Procedure: ESOPHAGOGASTRODUODENOSCOPY (EGD);  Surgeon: Gatha Mayer, MD;  Location: [facility] ENDOSCOPY;  Service: Endoscopy;  Laterality: N/A;  . ESOPHAGOGASTRODUODENOSCOPY N/A 05/17/2016   Procedure: ESOPHAGOGASTRODUODENOSCOPY (EGD);  Surgeon: Juanita Craver, MD;  Location: [facility] ENDOSCOPY;  Service: Endoscopy;  Laterality: N/A;  . GIVENS CAPSULE STUDY N/A 05/18/2016   Procedure: GIVENS CAPSULE STUDY;  Surgeon: Manus Gunning, MD;  Location: [place];  Service: Gastroenterology;  Laterality: N/A;  . LAPAROSCOPIC APPENDECTOMY  03/30/2011   Procedure:  APPENDECTOMY LAPAROSCOPIC;  Surgeon: Joyice Faster. Cornett, MD;  Location: [place];  Service: General;  Laterality: N/A;  . LEFT HEART CATHETERIZATION WITH CORONARY ANGIOGRAM N/A 01/24/2013   Procedure: LEFT HEART CATHETERIZATION WITH CORONARY ANGIOGRAM;  Surgeon: [name], MD;  Location: [facility] CATH LAB;  Service: Cardiovascular;  Laterality: N/A;  Right heart with grafts  . LITHOTRIPSY     "once" (01/21/2013)  . NM MYOCAR PERF WALL MOTION  05/2009   persantine myoview - fixed moderate perfusion defect in inferior wall & lateral segment of apex (poor non-transmural infarction), minimal anterolateral periinfarct reversible ischemia seen, abnormal study, defects similar to 2006 study  . SPLENECTOMY, TOTAL  01/2013  . TEE WITHOUT CARDIOVERSION N/A 01/23/2013   Procedure: TRANSESOPHAGEAL ECHOCARDIOGRAM (TEE);  Surgeon: Sanda Klein, MD;  Location: [facility] ENDOSCOPY;  Service: Cardiovascular;  Laterality: N/A;  . TRANSTHORACIC ECHOCARDIOGRAM  06/23/2012   EF 40-45%, mild LVH; mild AV regurg; mild MV regurg; LV mod-severely dilated; RV mildly dilated; systolic pressure borderline increased; RA mod dilated    Prior to Admission medications   Medication Sig Start Date End Date Taking? Authorizing Provider  apixaban (ELIQUIS) 2.5 MG TABS tablet Take 1 tablet (2.5 mg total) by mouth 2 (two) times daily. FOLLOW UP WITH CARDIOLOGY PRIOR TO NEXT REFILL AUTHORIZATION 08/03/17  Yes Troy Sine, MD  carvedilol (COREG) 3.125 MG tablet TAKE 1 TABLET(3.125 MG) BY MOUTH TWICE DAILY WITH A MEAL 08/25/17  Yes Almyra Deforest, PA  fluticasone (FLONASE) 50 MCG/ACT nasal spray Place 2 sprays into both nostrils daily.   Yes [provider]  furosemide (LASIX) 40 MG tablet TAKE 1/2 TABLET(20 MG) BY MOUTH DAILY 08/25/17  Yes Almyra Deforest, PA  guaiFENesin (MUCINEX) 600 MG 12 hr tablet Take 600 mg by mouth 2 (two) times daily as needed for cough or to loosen phlegm.   Yes [provider]  ipratropium (ATROVENT) 0.03 %  nasal spray Place 2 sprays into the nose 4 (four) times daily. 06/09/17  Yes [name], [name], MD  lisinopril (PRINIVIL,ZESTRIL) 5 MG tablet Take 1 tablet (5 mg total) by mouth daily. 08/25/17  Yes Almyra Deforest, PA  pantoprazole (PROTONIX) 40 MG tablet Take 1 tablet (40 mg total) by mouth daily. 08/25/17  Yes Meng, Isaac Laud, PA  rOPINIRole (REQUIP) 0.25 MG tablet TAKE 1 TABLET(0.25 MG) BY MOUTH TWICE DAILY 08/25/17  Yes Eulas Post, Merrill, PA  rosuvastatin (CRESTOR) 40 MG tablet TAKE 1 TABLET(40 MG) BY MOUTH DAILY 08/25/17  Yes Almyra Deforest, PA  traMADol (ULTRAM) 50 MG tablet TAKE ONE-HALF TABLET BY MOUTH EVERY 8 HOURS AS NEEDED FOR PAIN 04/28/17  Yes [name], [name], MD    Current Facility-Administered Medications  Medication Dose Route Frequency Provider Last Rate Last Dose  . 0.9 %  sodium chloride infusion  250 mL Intravenous PRN Black, Karen M, NP      . 0.9 %  sodium chloride infusion  250 mL Intravenous PRN Arnoldo Lenis, MD      . Derrill Memo ON 09/17/2017] 0.9 %  sodium chloride infusion   Intravenous Continuous Carlyle Dolly  F, MD 10 mL/hr at 09/16/17 2140    . acetaminophen (TYLENOL) tablet 650 mg  650 mg Oral Q4H PRN Radene Gunning, NP      . Derrill Memo ON 09/17/2017] aspirin chewable tablet 81 mg  81 mg Oral Karenann Cai, MD      . aspirin EC tablet 81 mg  81 mg Oral Daily Arnoldo Lenis, MD   81 mg at 09/16/17 1409  . carvedilol (COREG) tablet 3.125 mg  3.125 mg Oral BID WC Radene Gunning, NP   3.125 mg at 09/16/17 0916  . heparin ADULT infusion 100 units/mL (25000 units/226mL sodium chloride 0.45%)  1,000 Units/hr Intravenous Continuous Corinda Gubler, RPH 10 mL/hr at 09/16/17 2141 1,000 Units/hr at 09/16/17 2141  . lisinopril (PRINIVIL,ZESTRIL) tablet 5 mg  5 mg Oral Daily Radene Gunning, NP   5 mg at 09/16/17 3382  . MEDLINE mouth rinse  15 mL Mouth Rinse BID Karmen Bongo, MD   15 mL at 09/15/17 1103  . ondansetron (ZOFRAN) injection 4 mg  4 mg Intravenous Q6H PRN Black, Karen  M, NP      . pantoprazole (PROTONIX) EC tablet 40 mg  40 mg Oral Daily Radene Gunning, NP   40 mg at 09/16/17 0919  . rOPINIRole (REQUIP) tablet 0.25 mg  0.25 mg Oral BID Gardiner Barefoot, NP   0.25 mg at 09/16/17 0916  . rosuvastatin (CRESTOR) tablet 40 mg  40 mg Oral q1800 Arrien, Jimmy Picket, MD      . sodium chloride flush (NS) 0.9 % injection 3 mL  3 mL Intravenous Q12H Radene Gunning, NP   3 mL at 09/16/17 0923  . sodium chloride flush (NS) 0.9 % injection 3 mL  3 mL Intravenous PRN Black, Lezlie Octave, NP      . sodium chloride flush (NS) 0.9 % injection 3 mL  3 mL Intravenous Q12H Arnoldo Lenis, MD   3 mL at 09/16/17 2134  . sodium chloride flush (NS) 0.9 % injection 3 mL  3 mL Intravenous PRN Arnoldo Lenis, MD      . traMADol Veatrice Bourbon) tablet 25 mg  25 mg Oral Q12H PRN Radene Gunning, NP        Allergies as of 09/14/2017 - Review Complete 09/14/2017  Allergen Reaction Noted  . Percocet [oxycodone-acetaminophen] Itching and Nausea Only 04/09/2011    Family History  Problem Relation Age of Onset  . Heart disease Father        rheumatic fever  . Heart attack Brother 64  . Stroke Mother   . Stroke Brother 33    Social History   Socioeconomic History  . Marital status: Married    Spouse name: Not on file  . Number of children: 9  . Years of education: 8  . Highest education level: Not on file  Occupational History  . Occupation: retired  Scientific laboratory technician  . Financial resource strain: Not on file  . Food insecurity:    Worry: Not on file    Inability: Not on file  . Transportation needs:    Medical: Not on file    Non-medical: Not on file  Tobacco Use  . Smoking status: Former Smoker    Packs/day: 2.00    Years: 20.00    Pack years: 40.00    Types: Cigarettes    Last attempt to quit: 04/08/1986    Years since quitting: 31.4  . Smokeless tobacco: Current User  Types: Chew  Substance and Sexual Activity  . Alcohol use: No    Alcohol/week: 0.0 oz  .  Drug use: No  . Sexual activity: Not on file  Lifestyle  . Physical activity:    Days per week: Not on file    Minutes per session: Not on file  . Stress: Not on file  Relationships  . Social connections:    Talks on phone: Not on file    Gets together: Not on file    Attends religious service: Not on file    Active member of club or organization: Not on file    Attends meetings of clubs or organizations: Not on file    Relationship status: Not on file  . Intimate partner violence:    Fear of current or ex partner: Not on file    Emotionally abused: Not on file    Physically abused: Not on file    Forced sexual activity: Not on file  Other Topics Concern  . Not on file  Social History Narrative   A she is married, 5 sons 4 daughters. He is retired Horticulturist, commercial. 2-3 caffeinated beverages a day no alcohol     Review of Systems: Pertinent positive and negative review of systems were noted in the above HPI section. Complete review of systems was performed and was otherwise normal.   Physical Exam: Vital signs in last 24 hours: Temp:  [97.3 F (36.3 C)-98.6 F (37 C)] 98.6 F (37 C) (07/04 2037) Pulse Rate:  [60-67] 67 (07/04 2037) Resp:  [16-18] 18 (07/04 2037) BP: (113-145)/(50-66) 145/66 (07/04 2037) SpO2:  [95 %-100 %] 95 % (07/04 2037) Weight:  [179 lb 8 oz (81.4 kg)] 179 lb 8 oz (81.4 kg) (07/04 0300) Last BM Date: 09/16/17 Constitutional: generally well-appearing Psychiatric: alert and oriented x3 Eyes: extraocular movements intact Mouth: oral pharynx moist, no lesions Neck: supple no lymphadenopathy Cardiovascular: heart regular rate and rhythm Lungs: clear to auscultation bilaterally Abdomen: soft, nontender, nondistended, no obvious ascites, no peritoneal signs, normal bowel sounds Extremities: no lower extremity edema bilaterally Skin: no lesions on visible extremities  Lab Results: Recent Labs    09/14/17 0931 09/15/17 0641  WBC 8.1 6.6  HGB 11.5*  9.2*  HCT 36.3* 28.9*  PLT 211 178  MCV 100.3* 98.6   BMET Recent Labs    09/14/17 0931 09/15/17 0641 09/16/17 0616  NA 141 137 142  K 4.4 3.8 3.7  CL 110 103 104  CO2 25 29 31   GLUCOSE 124* 129* 101*  BUN 19 23 25*  CREATININE 1.58* 1.67* 1.62*  CALCIUM 8.6* 8.1* 8.3*   LFT Recent Labs    09/14/17 0931  BILITOT 1.2  AST 26  ALT 15  ALKPHOS 83  PROT 7.1  ALBUMIN 3.3*   PT/INR Recent Labs    09/16/17 1719  LABPROT 13.5  INR 1.04     Impression/Plan: 84 y.o. male esophageal food impaction, cannot handle his secretions  I asked that his heparin be turned off when I spoke with [facility], around 11:20. I do not think the food impaction will clear without endoscopy. I explained to him and a daughter in the room that there are risks to the procedure especially with his CHF.  We are planning on EGD, foreign body removal now.    Milus Banister, MD  09/16/2017, 11:26 PM [facility] Pager [phone]

## 2017-09-16 NOTE — Progress Notes (Signed)
MD alerted again about patients swallowing condition, says his throat is hurting and patients family afraid that his throat is swelling. Patient then vomited and says a good amount come up, but there's still a small piece in his throat, but he feels better. Patient not in distress- Just trying to spit up food bits.  Patient states over the past 20 years he's had to get food removed via by physicians 3 times. Also over members in family have had this condition.

## 2017-09-16 NOTE — Plan of Care (Signed)
  Problem: Clinical Measurements: Goal: Respiratory complications will improve Outcome: Progressing   

## 2017-09-16 NOTE — Care Management Note (Signed)
Case Management Note  Patient Details  Name: Gregory [name] MRN: [id number] Date of Birth: 11/04/1932  Subjective/Objective:   Acute Resp Failure                Action/Plan: Lives at home with spouse; PCP: [name], [name], MD; has private insurance with [facility] with prescription drug coverage, pharmacy of choice is [facility]; CM will continue to follow for progression of care.  Expected Discharge Date:    possibly 09/18/2017              Expected Discharge Plan:  Home/Self Care  Discharge planning Services  CM Consult  Status of Service:  In process, will continue to follow  [name] [phone] 09/16/2017, 11:29 AM

## 2017-09-16 NOTE — Progress Notes (Signed)
Pt reports " I got something /food stuck in my throat" reports had throat opened last year and no problems, family reports he has had to have food removed before, pt is no distress or sob, pt says he has tried to get it up, per midlevel will get ENT to come see pt but may be in th am , pt to remain NPO with no pills until seen or food coughed up or swallowed. jequietta night rn advised of conversation

## 2017-09-16 NOTE — Progress Notes (Signed)
Physical Therapy Treatment Patient Details Name: Gregory [name] MRN: [id number] DOB: Jan 22, 1933 Today's Date: 09/16/2017    History of Present Illness Pt is a 84 y.o. M with significant PMH of CAD s/p CABG 2003, chronic atrial fibrillation, carotid artery disease, hypertension, ischemic cardiomyopathy, hyperlipidemia, history of renal infarct, CVA, who presents with sudden onset shortness of breath.     PT Comments    Patient seen for activity progression. Ambulated on room air, saturations >95% throughout activity. HR mid (60s) stable. Modest DOE upon fatigue. No overt LOB noted. Anticipate patient will be safe for d/c home from mobility standpoint.   Follow Up Recommendations  No PT follow up;Supervision for mobility/OOB     Equipment Recommendations  None recommended by PT    Recommendations for Other Services       Precautions / Restrictions Precautions Precautions: Fall Precaution Comments: watch HR Restrictions Weight Bearing Restrictions: No    Mobility  Bed Mobility Overal bed mobility: Modified Independent                Transfers Overall transfer level: Modified independent                  Ambulation/Gait Ambulation/Gait assistance: Supervision Gait Distance (Feet): 260 Feet Assistive device: None Gait Pattern/deviations: Shuffle;Trunk flexed;Narrow base of support;Decreased stride length Gait velocity: decreased   General Gait Details: ambulated on room air, saturations >95% throughout activity. HR stable. Modest DOE upon fatigue. No overt LOB noted   Stairs             Wheelchair Mobility    Modified Rankin (Stroke Patients Only)       Balance Overall balance assessment: Mild deficits observed, not formally tested                                          Cognition Arousal/Alertness: Awake/alert Behavior During Therapy: WFL for tasks assessed/performed Overall Cognitive Status: Within Functional Limits  for tasks assessed                                        Exercises      General Comments        Pertinent Vitals/Pain Pain Assessment: No/denies pain    Home Living                      Prior Function            PT Goals (current goals can now be found in the care plan section) Acute Rehab PT Goals Patient Stated Goal: go home PT Goal Formulation: With patient Time For Goal Achievement: 09/29/17 Potential to Achieve Goals: Good Progress towards PT goals: Progressing toward goals    Frequency    Min 3X/week      PT Plan Current plan remains appropriate    Co-evaluation              AM-PAC PT "6 Clicks" Daily Activity  Outcome Measure  Difficulty turning over in bed (including adjusting bedclothes, sheets and blankets)?: None Difficulty moving from lying on back to sitting on the side of the bed? : None Difficulty sitting down on and standing up from a chair with arms (e.g., wheelchair, bedside commode, etc,.)?: None Help needed moving to and from a bed to  chair (including a wheelchair)?: A Little Help needed walking in hospital room?: A Little Help needed climbing 3-5 steps with a railing? : A Little 6 Click Score: 21    End of Session Equipment Utilized During Treatment: Gait belt Activity Tolerance: Patient tolerated treatment well Patient left: Other (comment);with call bell/phone within reach;with family/visitor present(sitting EOB) Nurse Communication: Mobility status PT Visit Diagnosis: Unsteadiness on feet (R26.81);Difficulty in walking, not elsewhere classified (R26.2)     Time: 5806-3868 PT Time Calculation (min) (ACUTE ONLY): 11 min  Charges:  $Gait Training: 8-22 mins                    G Codes:       Alben Deeds, PT DPT  Board Certified Neurologic Specialist [place] 09/16/2017, 1:39 PM

## 2017-09-16 NOTE — Progress Notes (Addendum)
Cardiology Consultation:   Patient ID: Gregory [name]; [id number]; 1932-04-16   Admit date: 09/14/2017 Date of Consult: 09/16/2017  Primary Care Provider: Darreld Mclean, MD Primary Cardiologist: Larene Beach Electrophysiologist:  na   Patient Profile:   Gregory [name] is a 84 y.o. male with a hx of CAD, ICM  who is being seen today for the evaluation of SOB at the request of Dr Cathlean Sauer.  History of Present Illness:   Gregory [name] 84 yo male history of CAD with prior CABG (LIMA to LAD, SVG to OM, sequential SVG to PDA and PLA of RCA by Dr. 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 Regarding his ICM LVEF as low as 20-25% in 2008, improved over time. From notes he previously refused ICD.   Presents with onset of SOB on Tuesday. States recent viral illness he was recovering from, however his SOB got markedly worst on Tuesday morning when he woke up. Denies any chest pain. No LE edema. Has been compliant with meds.    WBC 8.1 Hgb 11.5 Plt 211 K 4.4 Cr 1.6/GFR 39, BNP 413  Trop neg -->0.08--> CXR: pulm edema vs pneumonia Echo LVEF 25-30%, diffuse hypokinesis, akinesis mid apicalanteroseptal, mild to mod MR EKG afib new lateral ST depressions  Past Medical History:  Diagnosis Date  . Acute respiratory failure ([place])   . Appendicitis with abscess 03/31/2011  . Arthritis   . CAD (coronary artery disease),CABG 1993-LIMA to the LAD, SVG to Om and SVG to PDA/PLA, patent on cath 2008. 2003   a. s/p CABG 2003. b. last cath 2008 with patent grafts.  . Chronic atrial fibrillation ([facility])    permanent  . Chronic systolic CHF (congestive heart failure) ([place])    a. Prior low EF, later normalized.  . Essential hypertension   . GERD (gastroesophageal reflux disease)   . Heart murmur   . Hyperlipidemia   . Ischemic cardiomyopathy     . Kidney stones    "passed all but one time when he had to have lithotripsy" (01/21/2013)  . Pulmonary hypertension ([place])   . Renal infarct ([place])    a. 07/2015 - after holding Xarelto x 3 days for spinal procedure.  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 SSS (sick sinus syndrome) ([place])   . Stroke [facility]) 2007   "slightly drags left foot since; recovered qthing else" (01/21/2013)  . Syncope 07/2015   a. felt vasovagal in setting of pain from renal infarct.    Past Surgical History:  Procedure Laterality Date  . BALLOON DILATION N/A 05/22/2015   Procedure: BALLOON DILATION;  Surgeon: Gatha Mayer, MD;  Location: [facility] ENDOSCOPY;  Service: Endoscopy;  Laterality: N/A;  . CARDIAC CATHETERIZATION  02/24/2002   reduced LV function, 60-70% prox RCA stenosis, 70% PLA ostial stenosis, 80% secondary branch of PLA stenosis - subsequent CABG (Dr. Jackie Plum)  . Carotid Doppler  06/2012   50-69% right bulb/prox ICA diameter reduction; 70-99% left bulb/prox ICA diameter reduction  . CATARACT EXTRACTION W/ INTRAOCULAR LENS IMPLANT Bilateral 2013  . COLONOSCOPY N/A 05/18/2016   Procedure: COLONOSCOPY;  Surgeon: Manus Gunning, MD;  Location: [facility] ENDOSCOPY;  Service: Gastroenterology;  Laterality: N/A;  . CORONARY ANGIOPLASTY  07/05/2006   3 vessel CAD, patent LIMA to LAD, patentVG to OM, patent SVG to PDA & PLA, mild MR, severe LV systolic dysfunction (Dr. Alfonso Patten.  [place])  . CORONARY ARTERY BYPASS GRAFT  03/01/2002   LIMA to LAD, reverse SVG to OM, reverse SVG to PDA of RCA, reverse SVG to PLA of RCA, ligation of LA appendage (Dr. Servando Snare)  . ENTEROSCOPY N/A 05/20/2016   Procedure: ENTEROSCOPY;  Surgeon: Manus Gunning, MD;  Location: [place];  Service: Gastroenterology;  Laterality: N/A;  . ESOPHAGOGASTRODUODENOSCOPY  02/01/2012   Procedure: ESOPHAGOGASTRODUODENOSCOPY (EGD);  Surgeon: Beryle Beams, MD;  Location: [facility];  Service: Endoscopy;  Laterality: N/A;  . ESOPHAGOGASTRODUODENOSCOPY N/A  05/22/2015   Procedure: ESOPHAGOGASTRODUODENOSCOPY (EGD);  Surgeon: Gatha Mayer, MD;  Location: [facility] ENDOS[facility];  Service: Endoscopy;  Laterality: N/A;  . ESOPHAGOGASTRODUODENOSCOPY N/A 05/17/2016   Procedure: ESOPHAGOGASTRODUODENOSCOPY (EGD);  Surgeon: Juanita Craver, MD;  Location: [facility] ENDOSCOPY;  Service: Endoscopy;  Laterality: N/A;  . GIVENS CAPSULE STUDY N/A 05/18/2016   Procedure: GIVENS CAPSULE STUDY;  Surgeon: Manus Gunning, MD;  Location: [place];  Service: Gastroenterology;  Laterality: N/A;  . LAPAROSCOPIC APPENDECTOMY  03/30/2011   Procedure: APPENDECTOMY LAPAROSCOPIC;  Surgeon: Joyice Faster. Cornett, MD;  Location: [place];  Service: General;  Laterality: N/A;  . LEFT HEART CATHETERIZATION WITH CORONARY ANGIOGRAM N/A 01/24/2013   Procedure: LEFT HEART CATHETERIZATION WITH CORONARY ANGIOGRAM;  Surgeon: [name], MD;  Location: [facility] CATH LAB;  Service: Cardiovascular;  Laterality: N/A;  Right heart with grafts  . LITHOTRIPSY     "once" (01/21/2013)  . NM MYOCAR PERF WALL MOTION  05/2009   persantine myoview - fixed moderate perfusion defect in inferior wall & lateral segment of apex (poor non-transmural infarction), minimal anterolateral periinfarct reversible ischemia seen, abnormal study, defects similar to 2006 study  . SPLENECTOMY, TOTAL  01/2013  . TEE WITHOUT CARDIOVERSION N/A 01/23/2013   Procedure: TRANSESOPHAGEAL ECHOCARDIOGRAM (TEE);  Surgeon: Sanda Klein, MD;  Location: [facility] ENDOSCOPY;  Service: Cardiovascular;  Laterality: N/A;  . TRANSTHORACIC ECHOCARDIOGRAM  06/23/2012   EF 40-45%, mild LVH; mild AV regurg; mild MV regurg; LV mod-severely dilated; RV mildly dilated; systolic pressure borderline increased; RA mod dilated      Inpatient Medications: Scheduled Meds: . apixaban  2.5 mg Oral BID  . carvedilol  3.125 mg Oral BID WC  . furosemide  40 mg Oral Daily  . lisinopril  5 mg Oral Daily  . mouth rinse  15 mL Mouth Rinse BID  . pantoprazole  40 mg Oral Daily    . rOPINIRole  0.25 mg Oral BID  . rosuvastatin  40 mg Oral q1800  . sodium chloride flush  3 mL Intravenous Q12H   Continuous Infusions: . sodium chloride     PRN Meds: sodium chloride, acetaminophen, ondansetron (ZOFRAN) IV, sodium chloride flush, traMADol  Allergies:    Allergies  Allergen Reactions  . Percocet [Oxycodone-Acetaminophen] Itching and Nausea Only    Social History:   Social History   Socioeconomic History  . Marital status: Married    Spouse name: Not on file  . Number of children: 9  . Years of education: 8  . Highest education level: Not on file  Occupational History  . Occupation: retired  Scientific laboratory technician  . Financial resource strain: Not on file  . Food insecurity:    Worry: Not on file    Inability: Not on file  . Transportation needs:    Medical: Not on file    Non-medical: Not on file  Tobacco Use  . Smoking status: Former Smoker    Packs/day: 2.00    Years: 20.00  Pack years: 40.00    Types: Cigarettes    Last attempt to quit: 04/08/1986    Years since quitting: 31.4  . Smokeless tobacco: Current User    Types: Chew  Substance and Sexual Activity  . Alcohol use: No    Alcohol/week: 0.0 oz  . Drug use: No  . Sexual activity: Not on file  Lifestyle  . Physical activity:    Days per week: Not on file    Minutes per session: Not on file  . Stress: Not on file  Relationships  . Social connections:    Talks on phone: Not on file    Gets together: Not on file    Attends religious service: Not on file    Active member of club or organization: Not on file    Attends meetings of clubs or organizations: Not on file    Relationship status: Not on file  . Intimate partner violence:    Fear of current or ex partner: Not on file    Emotionally abused: Not on file    Physically abused: Not on file    Forced sexual activity: Not on file  Other Topics Concern  . Not on file  Social History Narrative   A she is married, 5 sons 4 daughters.  He is retired Horticulturist, commercial. 2-3 caffeinated beverages a day no alcohol    Family History:    Family History  Problem Relation Age of Onset  . Heart disease Father        rheumatic fever  . Heart attack Brother 51  . Stroke Mother   . Stroke Brother 74     ROS:  Please see the history of present illness.  All other ROS reviewed and negative.     Physical Exam/Data:   Vitals:   09/16/17 0517 09/16/17 0517 09/16/17 0742 09/16/17 1105  BP: (!) 113/50  (!) 117/59 (!) 113/53  Pulse: 63  67 63  Resp: 16  18 18   Temp:  98.4 F (36.9 C) 98.6 F (37 C) 98.6 F (37 C)  TempSrc:  Oral Oral Oral  SpO2: 98%  97% 99%  Weight:      Height:        Intake/Output Summary (Last 24 hours) at 09/16/2017 1232 Last data filed at 09/16/2017 0923 Gross per 24 hour  Intake 483 ml  Output 825 ml  Net -342 ml   Filed Weights   09/14/17 1553 09/15/17 0538 09/16/17 0300  Weight: 179 lb 6.4 oz (81.4 kg) 177 lb 8 oz (80.5 kg) 179 lb 8 oz (81.4 kg)   Body mass index is 28.11 kg/m.  General:  Well nourished, well developed, in no acute distress HEENT: normal Lymph: no adenopathy Neck: elevated JVD Endocrine:  No thryomegaly  Cardiac:  normal S1, S2; RRR; no murmur  Lungs:  clear to auscultation bilaterally, no wheezing, rhonchi or rales  Abd: soft, nontender, no hepatomegaly  Ext: no edema Musculoskeletal:  No deformities, BUE and BLE strength normal and equal Skin: warm and dry  Neuro:  CNs 2-12 intact, no focal abnormalities noted Psych:  Normal affect     Laboratory Data:  Chemistry Recent Labs  Lab 09/14/17 0931 09/15/17 0641 09/16/17 0616  NA 141 137 142  K 4.4 3.8 3.7  CL 110 103 104  CO2 25 29 31   GLUCOSE 124* 129* 101*  BUN 19 23 25*  CREATININE 1.58* 1.67* 1.62*  CALCIUM 8.6* 8.1* 8.3*  GFRNONAA 39* 36* 38*  GFRAA  45* 42* 44*  ANIONGAP 6 5 7     Recent Labs  Lab 09/14/17 0931  PROT 7.1  ALBUMIN 3.3*  AST 26  ALT 15  ALKPHOS 83  BILITOT 1.2    Hematology Recent Labs  Lab 09/14/17 0931 09/15/17 0641  WBC 8.1 6.6  RBC 3.62* 2.93*  HGB 11.5* 9.2*  HCT 36.3* 28.9*  MCV 100.3* 98.6  MCH 31.8 31.4  MCHC 31.7 31.8  RDW 15.9* 15.7*  PLT 211 178   Cardiac Enzymes Recent Labs  Lab 09/14/17 1354  TROPONINI 0.08*    Recent Labs  Lab 09/14/17 0938  TROPIPOC 0.01    BNP Recent Labs  Lab 09/14/17 0931  BNP 413.7*    DDimer No results for input(s): DDIMER in the last 168 hours.  Radiology/Studies:  Dg Chest Port 1 View  Result Date: 09/14/2017 CLINICAL DATA:  Shortness of breath. EXAM: PORTABLE CHEST 1 VIEW COMPARISON:  Radiographs of May 15, 2016. FINDINGS: Stable cardiomegaly. Atherosclerosis of thoracic aorta is noted. Status post coronary artery bypass graft. No pneumothorax or pleural effusion is noted. Increased right perihilar and basilar lung opacities are noted concerning for edema or possibly inflammation. Bony thorax is unremarkable. IMPRESSION: Increased right perihilar and basilar lung opacities are noted concerning for pulmonary edema or possibly pneumonia. Electronically Signed   By: Marijo Conception, M.D.   On: 09/14/2017 09:55    Assessment and Plan:   1. Acute on chronic sysotlic HF - history of ICM, severe LV dysfunction that had recovered as was normal as recent as 2017 - drop in LVEF from normal to 25-30% with new WMA in patient with known CAD this admission - clininc notes lisinopril lowered to 5mg  due to hypotension. Beta blocker limited due to bradycardia  - concern for recurrent LV dysfunction due to progression of CAD - we discussed a LHC/RHC in detail. He is willing to proceed, he understands the increased risk of contrast nephropathy given his renal function. - hold eliquis, start ASA and hep gtt.  - appears volume overloaded, with borderline renal function and cath tomorrow hold on aggressive diuretics today. F/u filling pressures tomorrow.   - will arrange LHC/RHC. Follow Cr and Hgb  tomorrow AM prior to procedure.    2. History of arterial emboli Multiple prior events, including when just holding xarelto for procedure - would need bridging for any interruption of anticoag - currently on eliquis  -must be bridged off anticoag, will stop eliquis and start hep gtt. Last eliquis dose 9AM 09/16/17.    3. CAD - known history of disease, prior CABG - acute drop in LVEF, new deep lateral TWIs. Mild trop thus far. No chest pain - plan for LHC/RHC   4. Permanent afib - rate control with coreg, has had prior issues with bradycardia. Notes indicate history of sick sinus. Brady on tele at times, no symptoms. In setting of systolic dysfunction and CAD with possible ACS continue beta blocker at this time.  - on eliquis, hold for cath. Bridge with hep due to prior arterial emboli.   5. Stage IV CKD - baseline Cr 1.6-2   6. Anemia - Hgb from 11.5 on admit to 9.2 - repeat cbc tomorrow   I have reviewed the risks, indications, and alternatives to cardiac catheterization, possible angioplasty, and stenting with the patient and his family today. Risks include but are not limited to bleeding, infection, vascular injury, stroke, myocardial infection, arrhythmia, kidney injury, radiation-related injury in the case of prolonged fluoroscopy  use, emergency cardiac surgery, and death. The patient understands the risks of serious complication is 1-2 in [id number] with diagnostic cardiac cath and 1-2% or less with angioplasty/stenting.    For questions or updates, please contact [place] Please consult [email or web address] for contact info under Cardiology/STEMI.   Merrily Pew, MD  09/16/2017 12:32 PM

## 2017-09-16 NOTE — Progress Notes (Signed)
Endoscopy called, MD came to see patient. Patient will go down for scope.

## 2017-09-16 NOTE — Progress Notes (Signed)
Talked to Dr. Ardis Hughs and triad on call, orders to hold heparin and Dr. Ardis Hughs will be to see patient in a few hours.

## 2017-09-16 NOTE — Progress Notes (Signed)
Blood sugar checked after glucagon given, will check again and evaluate medication.

## 2017-09-16 NOTE — Consult Note (Signed)
[facility] Referring Provider: [facility] hospitalist Primary Care Physician:  Darreld Mclean, MD Primary Gastroenterologist:  Dr.Gessner Reason for Consultation:  Food impaction in esophagus   HPI:  Gregory [name] is a 84 y.o. male with h/o esophageal stricture that has required dilation in the past, has had overt food impaction in the past. Ate some steak today around 1pm and feels some is still stuck in his esophagus after glucagon, simethecone trials, vomiting some of it out.  He has no chest pain but his throat is a bit sore from the vomiting.  He cannot swallow his spit, has a trash can at the bedside that he has been spitting into.  His daughter called me and then I was called by [facility] hospitalist and came to evaluate him on 3E   Past Medical History:  Diagnosis Date  . Acute respiratory failure ([place])   . Appendicitis with abscess 03/31/2011  . Arthritis   . CAD (coronary artery disease),CABG 1993-LIMA to the LAD, SVG to Om and SVG to PDA/PLA, patent on cath 2008. 2003   a. s/p CABG 2003. b. last cath 2008 with patent grafts.  . Chronic atrial fibrillation (HCC)    permanent  . Chronic systolic CHF (congestive heart failure) ([place])    a. Prior low EF, later normalized.  . Essential hypertension   . GERD (gastroesophageal reflux disease)   . Heart murmur   . Hyperlipidemia   . Ischemic cardiomyopathy   . Kidney stones    "passed all but one time when he had to have lithotripsy" (01/21/2013)  . Pulmonary hypertension ([place])   . Renal infarct ([place])    a. 07/2015 - after holding Xarelto x 3 days for spinal procedure.  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 SSS (sick sinus syndrome) ([place])   . Stroke [facility]) 2007   "slightly drags left foot since; recovered qthing else" (01/21/2013)  . Syncope 07/2015   a. felt vasovagal in setting of pain from renal infarct.    Past Surgical History:  Procedure Laterality Date  . BALLOON DILATION N/A 05/22/2015   Procedure: BALLOON  DILATION;  Surgeon: Gatha Mayer, MD;  Location: [facility] ENDOSCOPY;  Service: Endoscopy;  Laterality: N/A;  . CARDIAC CATHETERIZATION  02/24/2002   reduced LV function, 60-70% prox RCA stenosis, 70% PLA ostial stenosis, 80% secondary branch of PLA stenosis - subsequent CABG (Dr. Jackie Plum)  . Carotid Doppler  06/2012   50-69% right bulb/prox ICA diameter reduction; 70-99% left bulb/prox ICA diameter reduction  . CATARACT EXTRACTION W/ INTRAOCULAR LENS IMPLANT Bilateral 2013  . COLONOSCOPY N/A 05/18/2016   Procedure: COLONOSCOPY;  Surgeon: Manus Gunning, MD;  Location: [facility] ENDOSCOPY;  Service: Gastroenterology;  Laterality: N/A;  . CORONARY ANGIOPLASTY  07/05/2006   3 vessel CAD, patent LIMA to LAD, patentVG to OM, patent SVG to PDA & PLA, mild MR, severe LV systolic dysfunction (Dr. Gerrie Nordmann)  . CORONARY ARTERY BYPASS GRAFT  03/01/2002   LIMA to LAD, reverse SVG to OM, reverse SVG to PDA of RCA, reverse SVG to PLA of RCA, ligation of LA appendage (Dr. Servando Snare)  . ENTEROSCOPY N/A 05/20/2016   Procedure: ENTEROSCOPY;  Surgeon: Manus Gunning, MD;  Location: [facility];  Service: Gastroenterology;  Laterality: N/A;  . ESOPHAGOGASTRODUODENOSCOPY  02/01/2012   Procedure: ESOPHAGOGASTRODUODENOSCOPY (EGD);  Surgeon: Beryle Beams, MD;  Location: [facility];  Service: Endoscopy;  Laterality: N/A;  . ESOPHAGOGASTRODUODENOSCOPY N/A 05/22/2015   Procedure: ESOPHAGOGASTRODUODENOSCOPY (EGD);  Surgeon: Gatha Mayer, MD;  Location: [facility];  Service: Endoscopy;  Laterality: N/A;  . ESOPHAGOGASTRODUODENOSCOPY N/A 05/17/2016   Procedure: ESOPHAGOGASTRODUODENOSCOPY (EGD);  Surgeon: Juanita Craver, MD;  Location: [facility] ENDOSCOPY;  Service: Endoscopy;  Laterality: N/A;  . GIVENS CAPSULE STUDY N/A 05/18/2016   Procedure: GIVENS CAPSULE STUDY;  Surgeon: Manus Gunning, MD;  Location: [facility];  Service: Gastroenterology;  Laterality: N/A;  . LAPAROSCOPIC APPENDECTOMY  03/30/2011   Procedure:  APPENDECTOMY LAPAROSCOPIC;  Surgeon: Joyice Faster. Cornett, MD;  Location: [facility];  Service: General;  Laterality: N/A;  . LEFT HEART CATHETERIZATION WITH CORONARY ANGIOGRAM N/A 01/24/2013   Procedure: LEFT HEART CATHETERIZATION WITH CORONARY ANGIOGRAM;  Surgeon: [name], MD;  Location: [facility] CATH LAB;  Service: Cardiovascular;  Laterality: N/A;  Right heart with grafts  . LITHOTRIPSY     "once" (01/21/2013)  . NM MYOCAR PERF WALL MOTION  05/2009   persantine myoview - fixed moderate perfusion defect in inferior wall & lateral segment of apex (poor non-transmural infarction), minimal anterolateral periinfarct reversible ischemia seen, abnormal study, defects similar to 2006 study  . SPLENECTOMY, TOTAL  01/2013  . TEE WITHOUT CARDIOVERSION N/A 01/23/2013   Procedure: TRANSESOPHAGEAL ECHOCARDIOGRAM (TEE);  Surgeon: Sanda Klein, MD;  Location: [facility] ENDOSCOPY;  Service: Cardiovascular;  Laterality: N/A;  . TRANSTHORACIC ECHOCARDIOGRAM  06/23/2012   EF 40-45%, mild LVH; mild AV regurg; mild MV regurg; LV mod-severely dilated; RV mildly dilated; systolic pressure borderline increased; RA mod dilated    Prior to Admission medications   Medication Sig Start Date End Date Taking? Authorizing Provider  apixaban (ELIQUIS) 2.5 MG TABS tablet Take 1 tablet (2.5 mg total) by mouth 2 (two) times daily. FOLLOW UP WITH CARDIOLOGY PRIOR TO NEXT REFILL AUTHORIZATION 08/03/17  Yes Troy Sine, MD  carvedilol (COREG) 3.125 MG tablet TAKE 1 TABLET(3.125 MG) BY MOUTH TWICE DAILY WITH A MEAL 08/25/17  Yes Almyra Deforest, PA  fluticasone (FLONASE) 50 MCG/ACT nasal spray Place 2 sprays into both nostrils daily.   Yes [provider]  furosemide (LASIX) 40 MG tablet TAKE 1/2 TABLET(20 MG) BY MOUTH DAILY 08/25/17  Yes Almyra Deforest, PA  guaiFENesin (MUCINEX) 600 MG 12 hr tablet Take 600 mg by mouth 2 (two) times daily as needed for cough or to loosen phlegm.   Yes [provider]  ipratropium (ATROVENT) 0.03 %  nasal spray Place 2 sprays into the nose 4 (four) times daily. 06/09/17  Yes [name], [name], MD  lisinopril (PRINIVIL,ZESTRIL) 5 MG tablet Take 1 tablet (5 mg total) by mouth daily. 08/25/17  Yes Almyra Deforest, PA  pantoprazole (PROTONIX) 40 MG tablet Take 1 tablet (40 mg total) by mouth daily. 08/25/17  Yes Meng, Isaac Laud, PA  rOPINIRole (REQUIP) 0.25 MG tablet TAKE 1 TABLET(0.25 MG) BY MOUTH TWICE DAILY 08/25/17  Yes Eulas Post, [place], PA  rosuvastatin (CRESTOR) 40 MG tablet TAKE 1 TABLET(40 MG) BY MOUTH DAILY 08/25/17  Yes Almyra Deforest, PA  traMADol (ULTRAM) 50 MG tablet TAKE ONE-HALF TABLET BY MOUTH EVERY 8 HOURS AS NEEDED FOR PAIN 04/28/17  Yes [name], [name], MD    Current Facility-Administered Medications  Medication Dose Route Frequency Provider Last Rate Last Dose  . 0.9 %  sodium chloride infusion  250 mL Intravenous PRN Black, Karen M, NP      . 0.9 %  sodium chloride infusion  250 mL Intravenous PRN Arnoldo Lenis, MD      . Derrill Memo ON 09/17/2017] 0.9 %  sodium chloride infusion   Intravenous Continuous Carlyle Dolly  F, MD 10 mL/hr at 09/16/17 2140    . acetaminophen (TYLENOL) tablet 650 mg  650 mg Oral Q4H PRN Radene Gunning, NP      . Derrill Memo ON 09/17/2017] aspirin chewable tablet 81 mg  81 mg Oral Karenann Cai, MD      . aspirin EC tablet 81 mg  81 mg Oral Daily Arnoldo Lenis, MD   81 mg at 09/16/17 1409  . carvedilol (COREG) tablet 3.125 mg  3.125 mg Oral BID WC Radene Gunning, NP   3.125 mg at 09/16/17 0916  . heparin ADULT infusion 100 units/mL (25000 units/242mL sodium chloride 0.45%)  1,000 Units/hr Intravenous Continuous Corinda Gubler, RPH 10 mL/hr at 09/16/17 2141 1,000 Units/hr at 09/16/17 2141  . lisinopril (PRINIVIL,ZESTRIL) tablet 5 mg  5 mg Oral Daily Radene Gunning, NP   5 mg at 09/16/17 3614  . MEDLINE mouth rinse  15 mL Mouth Rinse BID Karmen Bongo, MD   15 mL at 09/15/17 1103  . ondansetron (ZOFRAN) injection 4 mg  4 mg Intravenous Q6H PRN Black, Karen  M, NP      . pantoprazole (PROTONIX) EC tablet 40 mg  40 mg Oral Daily Radene Gunning, NP   40 mg at 09/16/17 0919  . rOPINIRole (REQUIP) tablet 0.25 mg  0.25 mg Oral BID Gardiner Barefoot, NP   0.25 mg at 09/16/17 0916  . rosuvastatin (CRESTOR) tablet 40 mg  40 mg Oral q1800 Arrien, Jimmy Picket, MD      . sodium chloride flush (NS) 0.9 % injection 3 mL  3 mL Intravenous Q12H Radene Gunning, NP   3 mL at 09/16/17 0923  . sodium chloride flush (NS) 0.9 % injection 3 mL  3 mL Intravenous PRN Black, Lezlie Octave, NP      . sodium chloride flush (NS) 0.9 % injection 3 mL  3 mL Intravenous Q12H Arnoldo Lenis, MD   3 mL at 09/16/17 2134  . sodium chloride flush (NS) 0.9 % injection 3 mL  3 mL Intravenous PRN Arnoldo Lenis, MD      . traMADol Veatrice Bourbon) tablet 25 mg  25 mg Oral Q12H PRN Radene Gunning, NP        Allergies as of 09/14/2017 - Review Complete 09/14/2017  Allergen Reaction Noted  . Percocet [oxycodone-acetaminophen] Itching and Nausea Only 04/09/2011    Family History  Problem Relation Age of Onset  . Heart disease Father        rheumatic fever  . Heart attack Brother 74  . Stroke Mother   . Stroke Brother 61    Social History   Socioeconomic History  . Marital status: Married    Spouse name: Not on file  . Number of children: 9  . Years of education: 8  . Highest education level: Not on file  Occupational History  . Occupation: retired  Scientific laboratory technician  . Financial resource strain: Not on file  . Food insecurity:    Worry: Not on file    Inability: Not on file  . Transportation needs:    Medical: Not on file    Non-medical: Not on file  Tobacco Use  . Smoking status: Former Smoker    Packs/day: 2.00    Years: 20.00    Pack years: 40.00    Types: Cigarettes    Last attempt to quit: 04/08/1986    Years since quitting: 31.4  . Smokeless tobacco: Current User  Types: Chew  Substance and Sexual Activity  . Alcohol use: No    Alcohol/week: 0.0 oz  .  Drug use: No  . Sexual activity: Not on file  Lifestyle  . Physical activity:    Days per week: Not on file    Minutes per session: Not on file  . Stress: Not on file  Relationships  . Social connections:    Talks on phone: Not on file    Gets together: Not on file    Attends religious service: Not on file    Active member of club or organization: Not on file    Attends meetings of clubs or organizations: Not on file    Relationship status: Not on file  . Intimate partner violence:    Fear of current or ex partner: Not on file    Emotionally abused: Not on file    Physically abused: Not on file    Forced sexual activity: Not on file  Other Topics Concern  . Not on file  Social History Narrative   A she is married, 5 sons 4 daughters. He is retired Horticulturist, commercial. 2-3 caffeinated beverages a day no alcohol     Review of Systems: Pertinent positive and negative review of systems were noted in the above HPI section. Complete review of systems was performed and was otherwise normal.   Physical Exam: Vital signs in last 24 hours: Temp:  [97.3 F (36.3 C)-98.6 F (37 C)] 98.6 F (37 C) (07/04 2037) Pulse Rate:  [60-67] 67 (07/04 2037) Resp:  [16-18] 18 (07/04 2037) BP: (113-145)/(50-66) 145/66 (07/04 2037) SpO2:  [95 %-100 %] 95 % (07/04 2037) Weight:  [179 lb 8 oz (81.4 kg)] 179 lb 8 oz (81.4 kg) (07/04 0300) Last BM Date: 09/16/17 Constitutional: generally well-appearing Psychiatric: alert and oriented x3 Eyes: extraocular movements intact Mouth: oral pharynx moist, no lesions Neck: supple no lymphadenopathy Cardiovascular: heart regular rate and rhythm Lungs: clear to auscultation bilaterally Abdomen: soft, nontender, nondistended, no obvious ascites, no peritoneal signs, normal bowel sounds Extremities: no lower extremity edema bilaterally Skin: no lesions on visible extremities  Lab Results: Recent Labs    09/14/17 0931 09/15/17 0641  WBC 8.1 6.6  HGB 11.5*  9.2*  HCT 36.3* 28.9*  PLT 211 178  MCV 100.3* 98.6   BMET Recent Labs    09/14/17 0931 09/15/17 0641 09/16/17 0616  NA 141 137 142  K 4.4 3.8 3.7  CL 110 103 104  CO2 25 29 31   GLUCOSE 124* 129* 101*  BUN 19 23 25*  CREATININE 1.58* 1.67* 1.62*  CALCIUM 8.6* 8.1* 8.3*   LFT Recent Labs    09/14/17 0931  BILITOT 1.2  AST 26  ALT 15  ALKPHOS 83  PROT 7.1  ALBUMIN 3.3*   PT/INR Recent Labs    09/16/17 1719  LABPROT 13.5  INR 1.04     Impression/Plan: 84 y.o. male esophageal food impaction, cannot handle his secretions  I asked that his heparin be turned off when I spoke with [facility], around 11:20. I do not think the food impaction will clear without endoscopy. I explained to him and a daughter in the room that there are risks to the procedure especially with his CHF.  We are planning on EGD, foreign body removal now.    Milus Banister, MD  09/16/2017, 11:26 PM [facility] Pager [phone]

## 2017-09-16 NOTE — Progress Notes (Signed)
Pt had trouble swallowing meats around 1300 yesterday and apparently felt he had a piece of steak lodged in his throat. No resp distress noted but was also struggling to swallow pills. Pt was made NPO.  Pt had an episode of emesis and according to the bedside RN was feeling better with no further complaints. Apparently the pt was still having small bouts of emesis and coughing/discomfort which I was unaware of at the time.  Family called GI for consult with [facility] after pt still c/o throat soreness and discomfort. Spoke to [facility] Dr. Ardis Hughs regarding this issue and updated him of the situation. Dr. Ardis Hughs stated to turn off the heparin and they would come in tonight for a consult/possible scope.  Arby Barrette AGPCNP-BC, AGNP-C [facility] Pager- [phone]

## 2017-09-16 NOTE — Progress Notes (Signed)
ANTICOAGULATION CONSULT NOTE - Initial Consult  Pharmacy Consult for Heparin Indication: chest pain/ACS with AMI  Allergies  Allergen Reactions  . Percocet [Oxycodone-Acetaminophen] Itching and Nausea Only    Patient Measurements: Height: 5\' 7"  (170.2 cm) Weight: 179 lb 8 oz (81.4 kg)(scale c) IBW/kg (Calculated) : 66.1 Vital Signs: Temp: 98.6 F (37 C) (07/04 1105) Temp Source: Oral (07/04 1105) BP: 113/53 (07/04 1105) Pulse Rate: 63 (07/04 1105)  Labs: Recent Labs    09/14/17 0931 09/14/17 1354 09/15/17 0641 09/16/17 0616 09/16/17 1133  HGB 11.5*  --  9.2*  --   --   HCT 36.3*  --  28.9*  --   --   PLT 211  --  178  --   --   CREATININE 1.58*  --  1.67* 1.62*  --   TROPONINI  --  0.08*  --   --  <0.03    Estimated Creatinine Clearance: 35.3 mL/min (A) (by C-G formula based on SCr of 1.62 mg/dL (H)).   Medical History: Past Medical History:  Diagnosis Date  . Acute respiratory failure ([place])   . Appendicitis with abscess 03/31/2011  . Arthritis   . CAD (coronary artery disease),CABG 1993-LIMA to the LAD, SVG to Om and SVG to PDA/PLA, patent on cath 2008. 2003   a. s/p CABG 2003. b. last cath 2008 with patent grafts.  . Chronic atrial fibrillation (HCC)    permanent  . Chronic systolic CHF (congestive heart failure) ([place])    a. Prior low EF, later normalized.  . Essential hypertension   . GERD (gastroesophageal reflux disease)   . Heart murmur   . Hyperlipidemia   . Ischemic cardiomyopathy   . Kidney stones    "passed all but one time when he had to have lithotripsy" (01/21/2013)  . Pulmonary hypertension ([place])   . Renal infarct ([place])    a. 07/2015 - after holding Xarelto x 3 days for spinal procedure.  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 SSS (sick sinus syndrome) ([place])   . Stroke [facility]) 2007   "slightly drags left foot since; recovered qthing else" (01/21/2013)  . Syncope 07/2015   a. felt vasovagal in setting of pain from renal infarct.    Assessment: 84 yo  male with LV dysfunction and progression of his CAD, history of arterial emboli and atrial fibrillation.  On apixaban PTA, being transitioned to heparin drip.  Goal of Therapy:  Heparin level 0.3-0.7 units/ml aPTT 66 - 102 seconds Monitor platelets by anticoagulation protocol: Yes   Plan:  Initially will check both aPTTs and HLs until apixiban is out of the system. 12 hours after the last Apixaban dose; Start heparin infusion at 1000 units/hr Check anti-Xa level in 8 hours and daily while on heparin Continue to monitor H&H and platelets   Corinda Gubler, PharmD, [place] 09/16/2017,1:26 PM

## 2017-09-16 NOTE — Progress Notes (Signed)
PT just had labs drawn via right ac, pt needs to have 20g placed in right ac for cath in am, will wait to allow right ac to clot since venipuncture just drawn

## 2017-09-16 NOTE — Progress Notes (Signed)
PROGRESS NOTE    Gregory [name]  FWY:[id number] DOB: 1932-09-16 DOA: 09/14/2017 PCP: Darreld Mclean, MD    Brief Narrative:  84 year old male who presented with dyspnea. He does have the significant past medical history for coronary arterydisease status post CABG, 2003, chronic atrial fibrillation, carotid artery disease, hypertension, ischemic cardiomyopathy, dyslipidemia, and history of CVA. Patient developed acute dyspnea, severe in intensity, oxygen saturation was found down to 88% on room air. Patient wasnoted to be in respiratory distress, and he was placed on noninvasive mechanical ventilation. Physical examination blood pressure 139/63, heart rate 87, respirate 23, oxygen saturation 99% (on Bipap), moist mucous membranes, lungs with decreased breath sounds bilaterally, heart is S1-S2 present and rhythmic, tachycardic, abdomen soft nontender, no lower extremity edema. Sodium 141, potassium 4.4, chloride 110, bicarb 25, glucose 124, BUN 19, creatinine 1.58, BNP 413, troponin 0.08,white cells 8.1, hemoglobin 9.5, hematocrit 36.3, platelets 211,chest x-ray with cardiomegaly, increased interstitial markings bilaterally,peribronchial cuffing.EKG with right axis deviation, multiple PVCs, atrial fibrillation, low voltage.  Patient was admitted to the hospital withtheworking diagnosis of acute decompensated systolic heart failure.   Assessment & Plan:   Principal Problem:   Acute respiratory failure (HCC) Active Problems:   CAD (coronary artery disease),CABG 1993-LIMA to the LAD, SVG to Om and SVG to PDA/PLA, patent on cath 2014.   Chronic a-fib (HCC)   Anticoagulated on Eliquis   Chronic renal failure, stage 3 (moderate) (HCC)   Acute on chronic systolic heart failure ([place])   1.  Acute systolic heart failure decompensation /ischemic cardiomyopathy left ventricle ejection fraction 25 to 30%.  Patient was admitted to the medical ward, he was placed on a remote telemetry  monitor, received aggressive diuresis with IV furosemide.  Negative fluid balance was achieved with significant improvement of his symptoms.  Further work-up with echocardiography showed a reduction in systolic LV function to 25 to 30% with diffuse hypokinesis, akinesis of the mid-apical anterior septal and apical myocardium. Patient will continue lisinopril for ACE inhibitor and carvedilol for beta blockade.  His discharge weight is 81.4 kg / 179 pounds, instructed to increase furosemide to twice daily for 3 days if weight gain of 3 pounds in 24 hours or 5 pounds and 7 days.  Old records were personally reviewed, cardiology visit in June 2019, noted that he had a low ejection fraction in the past, 20 to 25% in 2008 and down to 15 to 25% in 2014.  Diffuse hypokinesis and akinesis anteroseptal and apical myocardium.  He had a large solid fixed thrombus occupying the entire left atrial appendage.  His last echocardiography before admission was in 2017 with recovered LV systolic function 27-74%.   Not on aspirin due increased risk of bleeding on apixaban, continue Crestor 40 mg daily.  2.  Coronary artery disease involving the coronary bypass graft of native heart.  Patient remained chest pain-free, his troponin was 0.08, his EKG had precordial T wave inversions that seem to be new, will repeat tropoin and ekg today. Patient denies any angina symptoms at home, his renal function is impaired with serum cr at 1,62. Patient likely will need further ischemic workup, to consider pharmacologic imaging stress test since elevated creatinine and risk of worsening renal function. Will consult cardiology.   3  Chronic atrial fibrillation.  Rate remained well controlled, patient will continue anticoagulation with apixaban.  4.  Stage IV chronic kidney disease.  Patient received aggressive diuresis with furosemide, discharge creatinine 1.62, potassium 3.7, serum bicarbonate 31.  Continue furosemide  40 mg daily, close  follow-up as an outpatient kidney function and electrolytes.  5.  Hypertension.  Patient had episodes of hypotension, asymptomatic, continue lisinopril and carvedilol at discharge.   DVT prophylaxis: apixaban   Code Status:  full Family Communication: I spoke with patient's family at the bedside and all questions were addressed.  Disposition Plan:  Pending cardiology recommendations   Consultants:   Cardiology   Procedures:     Antimicrobials:       Subjective: Patient is feeling better, dyspnea, has improved, no nausea or vomiting, no chest pain. Edema has improved.   Objective: Vitals:   09/16/17 0517 09/16/17 0517 09/16/17 0742 09/16/17 1105  BP: (!) 113/50  (!) 117/59 (!) 113/53  Pulse: 63  67 63  Resp: 16  18 18   Temp:  98.4 F (36.9 C) 98.6 F (37 C) 98.6 F (37 C)  TempSrc:  Oral Oral Oral  SpO2: 98%  97% 99%  Weight:      Height:        Intake/Output Summary (Last 24 hours) at 09/16/2017 1158 Last data filed at 09/16/2017 0923 Gross per 24 hour  Intake 483 ml  Output 825 ml  Net -342 ml   Filed Weights   09/14/17 1553 09/15/17 0538 09/16/17 0300  Weight: 81.4 kg (179 lb 6.4 oz) 80.5 kg (177 lb 8 oz) 81.4 kg (179 lb 8 oz)    Examination:   General: Not in pain or dyspnea.  Neurology: Awake and alert, non focal  E ENT: no pallor, no icterus, oral mucosa moist Cardiovascular: No JVD. S1-S2 present, rhythmic, no gallops, rubs, or murmurs. No lower extremity edema. Pulmonary: positive breath sounds bilaterally, adequate air movement, no wheezing, or rhonchi, scattered rales. Gastrointestinal. Abdomen with no organomegaly, non tender, no rebound or guarding Skin. No rashes Musculoskeletal: no joint deformities     Data Reviewed: I have personally reviewed following labs and imaging studies  CBC: Recent Labs  Lab 09/14/17 0931 09/15/17 0641  WBC 8.1 6.6  NEUTROABS 5.3  --   HGB 11.5* 9.2*  HCT 36.3* 28.9*  MCV 100.3* 98.6  PLT 211 836    Basic Metabolic Panel: Recent Labs  Lab 09/14/17 0931 09/15/17 0641 09/16/17 0616  NA 141 137 142  K 4.4 3.8 3.7  CL 110 103 104  CO2 25 29 31   GLUCOSE 124* 129* 101*  BUN 19 23 25*  CREATININE 1.58* 1.67* 1.62*  CALCIUM 8.6* 8.1* 8.3*   GFR: Estimated Creatinine Clearance: 35.3 mL/min (A) (by C-G formula based on SCr of 1.62 mg/dL (H)). Liver Function Tests: Recent Labs  Lab 09/14/17 0931  AST 26  ALT 15  ALKPHOS 83  BILITOT 1.2  PROT 7.1  ALBUMIN 3.3*   No results for input(s): LIPASE, AMYLASE in the last 168 hours. No results for input(s): AMMONIA in the last 168 hours. Coagulation Profile: No results for input(s): INR, PROTIME in the last 168 hours. Cardiac Enzymes: Recent Labs  Lab 09/14/17 1354  TROPONINI 0.08*   BNP (last 3 results) No results for input(s): PROBNP in the last 8760 hours. HbA1C: No results for input(s): HGBA1C in the last 72 hours. CBG: No results for input(s): GLUCAP in the last 168 hours. Lipid Profile: No results for input(s): CHOL, HDL, LDLCALC, TRIG, CHOLHDL, LDLDIRECT in the last 72 hours. Thyroid Function Tests: No results for input(s): TSH, T4TOTAL, FREET4, T3FREE, THYROIDAB in the last 72 hours. Anemia Panel: No results for input(s): VITAMINB12, FOLATE, FERRITIN, TIBC, IRON, RETICCTPCT  in the last 72 hours.    Radiology Studies: I have reviewed all of the imaging during this hospital visit personally     Scheduled Meds: . apixaban  2.5 mg Oral BID  . carvedilol  3.125 mg Oral BID WC  . furosemide  40 mg Oral Daily  . lisinopril  5 mg Oral Daily  . mouth rinse  15 mL Mouth Rinse BID  . pantoprazole  40 mg Oral Daily  . rOPINIRole  0.25 mg Oral BID  . rosuvastatin  40 mg Oral q1800  . sodium chloride flush  3 mL Intravenous Q12H   Continuous Infusions: . sodium chloride       LOS: 2 days        Kindra Bickham Gerome Apley, MD [facility] Pager [phone]

## 2017-09-16 NOTE — Progress Notes (Signed)
Talked to Rapid response to alert of patients condition, also ED and ENT was notified per NP.

## 2017-09-16 NOTE — Progress Notes (Signed)
Patient states he's feeling better after vomiting, then he'll state he feels better. Believe his throat is irritated from coughing/dry heaving- trying to get food up.  Family upset and saying that someone should be up here to remove what's left of stuck food particle. Patient still not in any distress, just aggravated with the whole situation.  Patient states the other times food has to be removed the food was stuck for about 3 days then he went to hid doctors office, the other 2 times a procedure was done in the ED to remove particle (one of those times "a lady had to be called from [facility] to come get it out").  RN will continue to monitor and will call NP with updates.

## 2017-09-16 NOTE — Progress Notes (Signed)
Messaged pharmacy about requip or equivalent because patients legs are bothering him. No IV equivalent available, patient not able to safely swallow.

## 2017-09-17 ENCOUNTER — Encounter (HOSPITAL_COMMUNITY): Payer: Self-pay | Admitting: *Deleted

## 2017-09-17 ENCOUNTER — Encounter (HOSPITAL_COMMUNITY)
Admission: EM | Disposition: A | Discharge status: Home / Self Care | Payer: Self-pay | Source: Home / Self Care | Attending: Internal Medicine

## 2017-09-17 ENCOUNTER — Telehealth: Payer: Self-pay

## 2017-09-17 DIAGNOSIS — N183 Chronic kidney disease, stage 3 (moderate): Secondary | ICD-10-CM

## 2017-09-17 DIAGNOSIS — I482 Chronic atrial fibrillation: Secondary | ICD-10-CM

## 2017-09-17 DIAGNOSIS — T18128A Food in esophagus causing other injury, initial encounter: Secondary | ICD-10-CM

## 2017-09-17 DIAGNOSIS — I509 Heart failure, unspecified: Secondary | ICD-10-CM

## 2017-09-17 DIAGNOSIS — Z7901 Long term (current) use of anticoagulants: Secondary | ICD-10-CM

## 2017-09-17 DIAGNOSIS — K222 Esophageal obstruction: Secondary | ICD-10-CM

## 2017-09-17 DIAGNOSIS — I25810 Atherosclerosis of coronary artery bypass graft(s) without angina pectoris: Secondary | ICD-10-CM

## 2017-09-17 DIAGNOSIS — J9601 Acute respiratory failure with hypoxia: Secondary | ICD-10-CM

## 2017-09-17 HISTORY — PX: ESOPHAGOGASTRODUODENOSCOPY: SHX5428

## 2017-09-17 HISTORY — PX: FOREIGN BODY REMOVAL: SHX962

## 2017-09-17 HISTORY — PX: RIGHT/LEFT HEART CATH AND CORONARY ANGIOGRAPHY: CATH118266

## 2017-09-17 LAB — POCT I-STAT 3, VENOUS BLOOD GAS (G3P V)
ACID-BASE EXCESS: 2 mmol/L (ref 0.0–2.0)
BICARBONATE: 27.5 mmol/L (ref 20.0–28.0)
O2 SAT: 60 %
TCO2: 29 mmol/L (ref 22–32)
pCO2, Ven: 48.1 mmHg (ref 44.0–60.0)
pH, Ven: 7.365 (ref 7.250–7.430)
pO2, Ven: 33 mmHg (ref 32.0–45.0)

## 2017-09-17 LAB — POCT I-STAT 3, ART BLOOD GAS (G3+)
ACID-BASE EXCESS: 1 mmol/L (ref 0.0–2.0)
BICARBONATE: 26 mmol/L (ref 20.0–28.0)
O2 Saturation: 97 %
PO2 ART: 86 mmHg (ref 83.0–108.0)
TCO2: 27 mmol/L (ref 22–32)
pCO2 arterial: 41 mmHg (ref 32.0–48.0)
pH, Arterial: 7.411 (ref 7.350–7.450)

## 2017-09-17 LAB — CBC
HCT: 30.2 % — ABNORMAL LOW (ref 39.0–52.0)
HEMOGLOBIN: 9.7 g/dL — AB (ref 13.0–17.0)
MCH: 31.6 pg (ref 26.0–34.0)
MCHC: 32.1 g/dL (ref 30.0–36.0)
MCV: 98.4 fL (ref 78.0–100.0)
Platelets: 190 10*3/uL (ref 150–400)
RBC: 3.07 MIL/uL — ABNORMAL LOW (ref 4.22–5.81)
RDW: 15.4 % (ref 11.5–15.5)
WBC: 6.9 10*3/uL (ref 4.0–10.5)

## 2017-09-17 LAB — BASIC METABOLIC PANEL
Anion gap: 7 (ref 5–15)
BUN: 24 mg/dL — ABNORMAL HIGH (ref 8–23)
CALCIUM: 8.3 mg/dL — AB (ref 8.9–10.3)
CO2: 29 mmol/L (ref 22–32)
Chloride: 105 mmol/L (ref 98–111)
Creatinine, Ser: 1.5 mg/dL — ABNORMAL HIGH (ref 0.61–1.24)
GFR calc Af Amer: 48 mL/min — ABNORMAL LOW (ref >60)
GFR, EST NON AFRICAN AMERICAN: 41 mL/min — AB (ref >60)
GLUCOSE: 94 mg/dL (ref 70–99)
Potassium: 3.4 mmol/L — ABNORMAL LOW (ref 3.5–5.1)
Sodium: 141 mmol/L (ref 135–145)

## 2017-09-17 LAB — GLUCOSE, CAPILLARY: Glucose-Capillary: 104 mg/dL — ABNORMAL HIGH (ref 70–99)

## 2017-09-17 SURGERY — EGD (ESOPHAGOGASTRODUODENOSCOPY)
Anesthesia: Moderate Sedation

## 2017-09-17 SURGERY — RIGHT/LEFT HEART CATH AND CORONARY ANGIOGRAPHY
Anesthesia: LOCAL

## 2017-09-17 MED ORDER — SODIUM CHLORIDE 0.9% FLUSH: 3.0000 mL | Freq: Two times a day (BID) | INTRAVENOUS | Status: DC

## 2017-09-17 MED ORDER — MIDAZOLAM HCL 2 MG/2ML IJ SOLN
INTRAMUSCULAR | Status: AC
  Filled 2017-09-17: qty 2

## 2017-09-17 MED ORDER — MIDAZOLAM HCL 5 MG/ML IJ SOLN
INTRAMUSCULAR | Status: AC
  Filled 2017-09-17: qty 2

## 2017-09-17 MED ORDER — ASPIRIN 81 MG PO CHEW
81.0000 mg | CHEWABLE_TABLET | ORAL | Status: AC
  Administered 2017-09-17: 81 mg via ORAL
  Filled 2017-09-17: qty 1

## 2017-09-17 MED ORDER — MIDAZOLAM HCL 10 MG/2ML IJ SOLN
INTRAMUSCULAR | Status: DC | PRN
  Administered 2017-09-17 (×2): 1 mg via INTRAVENOUS
  Administered 2017-09-17: 2 mg via INTRAVENOUS

## 2017-09-17 MED ORDER — VERAPAMIL HCL 2.5 MG/ML IV SOLN
INTRAVENOUS | Status: DC | PRN
  Administered 2017-09-17: 10 mL via INTRA_ARTERIAL

## 2017-09-17 MED ORDER — SODIUM CHLORIDE 0.9% FLUSH: 3.0000 mL | INTRAVENOUS | Status: DC | PRN

## 2017-09-17 MED ORDER — POTASSIUM CHLORIDE CRYS ER 20 MEQ PO TBCR
40.0000 meq | EXTENDED_RELEASE_TABLET | Freq: Once | ORAL | Status: AC
  Administered 2017-09-17: 40 meq via ORAL
  Filled 2017-09-17: qty 2

## 2017-09-17 MED ORDER — HEPARIN (PORCINE) IN NACL 100-0.45 UNIT/ML-% IJ SOLN: 1000.0000 [IU]/h | INTRAMUSCULAR | Status: DC

## 2017-09-17 MED ORDER — IOPAMIDOL (ISOVUE-370) INJECTION 76%
INTRAVENOUS | Status: DC | PRN
  Administered 2017-09-17: 85 mL via INTRA_ARTERIAL

## 2017-09-17 MED ORDER — HEPARIN (PORCINE) IN NACL 1000-0.9 UT/500ML-% IV SOLN
INTRAVENOUS | Status: DC | PRN
  Administered 2017-09-17: 1000 mL

## 2017-09-17 MED ORDER — DIPHENHYDRAMINE HCL 50 MG/ML IJ SOLN
INTRAMUSCULAR | Status: AC
  Filled 2017-09-17: qty 1

## 2017-09-17 MED ORDER — FENTANYL CITRATE (PF) 100 MCG/2ML IJ SOLN
INTRAMUSCULAR | Status: DC | PRN
  Administered 2017-09-17: 12.5 ug via INTRAVENOUS
  Administered 2017-09-17: 25 ug via INTRAVENOUS

## 2017-09-17 MED ORDER — PANTOPRAZOLE SODIUM 40 MG PO TBEC
40.0000 mg | DELAYED_RELEASE_TABLET | Freq: Two times a day (BID) | ORAL | Status: DC
  Administered 2017-09-17 – 2017-09-18 (×3): 40 mg via ORAL
  Filled 2017-09-17 (×3): qty 1

## 2017-09-17 MED ORDER — VERAPAMIL HCL 2.5 MG/ML IV SOLN
INTRAVENOUS | Status: AC
  Filled 2017-09-17: qty 2

## 2017-09-17 MED ORDER — SODIUM CHLORIDE 0.9 % IV SOLN: 250.0000 mL | INTRAVENOUS | Status: DC | PRN

## 2017-09-17 MED ORDER — ASPIRIN EC 81 MG PO TBEC: 81.0000 mg | DELAYED_RELEASE_TABLET | Freq: Every day | ORAL | Status: DC

## 2017-09-17 MED ORDER — HEPARIN SODIUM (PORCINE) 1000 UNIT/ML IJ SOLN
INTRAMUSCULAR | Status: DC | PRN
  Administered 2017-09-17: 4000 [IU] via INTRAVENOUS

## 2017-09-17 MED ORDER — IOPAMIDOL (ISOVUE-370) INJECTION 76%
INTRAVENOUS | Status: AC
  Filled 2017-09-17: qty 125

## 2017-09-17 MED ORDER — HEPARIN SODIUM (PORCINE) 1000 UNIT/ML IJ SOLN
INTRAMUSCULAR | Status: AC
  Filled 2017-09-17: qty 1

## 2017-09-17 MED ORDER — SODIUM CHLORIDE 0.9 % WEIGHT BASED INFUSION
1.0000 mL/kg/h | INTRAVENOUS | Status: AC
  Administered 2017-09-17: 1 mL/kg/h via INTRAVENOUS

## 2017-09-17 MED ORDER — FENTANYL CITRATE (PF) 100 MCG/2ML IJ SOLN
INTRAMUSCULAR | Status: AC
  Filled 2017-09-17: qty 2

## 2017-09-17 MED ORDER — LIDOCAINE HCL (PF) 1 % IJ SOLN
INTRAMUSCULAR | Status: DC | PRN
  Administered 2017-09-17: 5 mL

## 2017-09-17 MED ORDER — LIDOCAINE HCL (PF) 1 % IJ SOLN
INTRAMUSCULAR | Status: AC
  Filled 2017-09-17: qty 30

## 2017-09-17 MED ORDER — APIXABAN 2.5 MG PO TABS
2.5000 mg | ORAL_TABLET | Freq: Two times a day (BID) | ORAL | Status: DC
  Administered 2017-09-17 – 2017-09-18 (×2): 2.5 mg via ORAL
  Filled 2017-09-17 (×2): qty 1

## 2017-09-17 MED ORDER — SODIUM CHLORIDE 0.9 % IV SOLN
INTRAVENOUS | Status: DC
  Administered 2017-09-18: 04:00:00 via INTRAVENOUS

## 2017-09-17 MED ORDER — FENTANYL CITRATE (PF) 100 MCG/2ML IJ SOLN
INTRAMUSCULAR | Status: DC | PRN
  Administered 2017-09-17: 25 ug via INTRAVENOUS

## 2017-09-17 MED ORDER — HEPARIN (PORCINE) IN NACL 1000-0.9 UT/500ML-% IV SOLN
INTRAVENOUS | Status: AC
  Filled 2017-09-17: qty 1000

## 2017-09-17 MED ORDER — MIDAZOLAM HCL 2 MG/2ML IJ SOLN
INTRAMUSCULAR | Status: DC | PRN
  Administered 2017-09-17: 1 mg via INTRAVENOUS

## 2017-09-17 SURGICAL SUPPLY — 13 items
CATH BALLN WEDGE 5F 110CM (CATHETERS) ×1 IMPLANT
CATH INFINITI 5 FR IM (CATHETERS) ×1 IMPLANT
CATH INFINITI 5 FR LCB (CATHETERS) ×1 IMPLANT
CATH INFINITI 5FR MULTPACK ANG (CATHETERS) ×1 IMPLANT
DEVICE RAD COMP TR BAND LRG (VASCULAR PRODUCTS) ×1 IMPLANT
GLIDESHEATH SLEND SS 6F .021 (SHEATH) ×1 IMPLANT
GUIDEWIRE INQWIRE 1.5J.035X260 (WIRE) IMPLANT
INQWIRE 1.5J .035X260CM (WIRE) ×2
KIT HEART LEFT (KITS) ×2 IMPLANT
PACK CARDIAC CATHETERIZATION (CUSTOM PROCEDURE TRAY) ×2 IMPLANT
SHEATH GLIDE SLENDER 4/5FR (SHEATH) ×1 IMPLANT
TRANSDUCER W/STOPCOCK (MISCELLANEOUS) ×2 IMPLANT
TUBING CIL FLEX 10 FLL-RA (TUBING) ×2 IMPLANT

## 2017-09-17 NOTE — Progress Notes (Signed)
RN called regarding patient c/o food stuck in throat.  VSS.  Pt in no distress per RN.  Advised to notify provider and call if further assistance needed.

## 2017-09-17 NOTE — Interval H&P Note (Signed)
History and Physical Interval Note:  09/17/2017 12:23 AM  Gregory [name]  has presented today for surgery, with the diagnosis of food impaction  The various methods of treatment have been discussed with the patient and family. After consideration of risks, benefits and other options for treatment, the patient has consented to  Procedure(s): ESOPHAGOGASTRODUODENOSCOPY (EGD) (N/A) as a surgical intervention .  The patient's history has been reviewed, patient examined, no change in status, stable for surgery.  I have reviewed the patient's chart and labs.  Questions were answered to the patient's satisfaction.     Gregory [name]

## 2017-09-17 NOTE — Interval H&P Note (Signed)
History and Physical Interval Note:  09/17/2017 9:58 AM  Gregory [name]  has presented today for surgery, with the diagnosis of unstable angina  The various methods of treatment have been discussed with the patient and family. After consideration of risks, benefits and other options for treatment, the patient has consented to  Procedure(s): RIGHT/LEFT HEART CATH AND CORONARY ANGIOGRAPHY (N/A) as a surgical intervention .  The patient's history has been reviewed, patient examined, no change in status, stable for surgery.  I have reviewed the patient's chart and labs.  Questions were answered to the patient's satisfaction.     Collier [facility]

## 2017-09-17 NOTE — Progress Notes (Signed)
EGD done, RN received report and patient will be back up to unit soon- told to restart heparin drip.

## 2017-09-17 NOTE — Progress Notes (Signed)
Patient stable, VSS.   Patient should have mechanical soft diet/chopped during hospital stay (after NPO).

## 2017-09-17 NOTE — Telephone Encounter (Signed)
He is currently hospitalized.

## 2017-09-17 NOTE — Op Note (Addendum)
[facility] Patient Name: Gregory [name] Procedure Date : 09/17/2017 MRN: [id number] Attending MD: Milus Banister , MD Date of Birth: [birth year] CSN: [id number] Age: 84 Admit Type: Inpatient Procedure:                Upper GI endoscopy Indications:              Dysphagia Providers:                Milus Banister, MD, Elmer Ramp. Tilden Dome, RN,                            William Dalton, Technician Referring MD:              Medicines:                Fentanyl 37.5 micrograms IV, Midazolam 4 mg IV Complications:            No immediate complications. Estimated blood loss:                            None. Estimated Blood Loss:     Estimated blood loss: none. Procedure:                Pre-Anesthesia Assessment:                           - Prior to the procedure, a History and Physical                            was performed, and patient medications and                            allergies were reviewed. The patient's tolerance of                            previous anesthesia was also reviewed. The risks                            and benefits of the procedure and the sedation                            options and risks were discussed with the patient.                            All questions were answered, and informed consent                            was obtained. Prior Anticoagulants: The patient has                            taken heparin, last dose was day of procedure. ASA                            Grade Assessment: IV - A patient with severe  systemic disease that is a constant threat to life.                            After reviewing the risks and benefits, the patient                            was deemed in satisfactory condition to undergo the                            procedure.                           After obtaining informed consent, the endoscope was                            passed under direct vision. Throughout the                    procedure, the patient's blood pressure, pulse, and                            oxygen saturations were monitored continuously. The                            [id number]-[id number] ([id number]) scope was introduced through the                            mouth, and advanced to the body of the stomach. The                            upper GI endoscopy was accomplished without                            difficulty. The patient tolerated the procedure                            well. Findings:      A large bolus of food (meat and other) was found in the mid to distal       esophagus. I removed several pieces with a [employer] and [employer] grasper       and then was able to push the remaining bolus into his stomach gently.      There was a focal GE junction stricture that was quite inflammed, not at       all neoplastic appearing. The stricture was not dilated.      The examination was otherwise without abnormality. Impression:               - Esophageal food impaction, successfully treated.                           - Benign, focal appearing, inflammed stricture at                            the [place] that was not dilated during this  procedure. Moderate Sedation:      Moderate (conscious) sedation was administered by the endoscopy nurse       and supervised by the endoscopist. The patient's oxygen saturation,       heart rate, blood pressure and response to care were monitored. Total       physician intraservice time was 22 minutes. Recommendation:           - Return patient to hospital ward for ongoing care.                           - He should be on twice daily oral PPI for now. He                            needs to chew his food well, eat slowly and take                            small bites.                           - OK to continue with plans for cardiac testing                            later today.                           - Can restart his IV heparin  now.                           - [facility] will arrange follow up officie                            appointment in next few weeks, will schedule repeat                            EGD at that point. Procedure Code(s):        --- Professional ---                           [phone], 52, Esophagogastroduodenoscopy, flexible,                            transoral; with removal of foreign body(s)                           G0500, Moderate sedation services provided by the                            same physician or other qualified health care                            professional performing a gastrointestinal                            endoscopic service that sedation supports,  requiring the presence of an independent trained                            observer to assist in the monitoring of the                            patient's level of consciousness and physiological                            status; initial 15 minutes of intra-service time;                            patient age 74 years or older (additional time may                            be reported with [phone], as appropriate) Diagnosis Code(s):        --- Professional ---                           [phone], Food in esophagus causing other injury,                            initial encounter                           R13.10, Dysphagia, unspecified CPT copyright 2017 American Medical Association. All rights reserved. The codes documented in this report are preliminary and upon coder review may  be revised to meet current compliance requirements. Milus Banister, MD 09/17/2017 1:04:24 AM This report has been signed electronically. Number of Addenda: 0

## 2017-09-17 NOTE — Progress Notes (Signed)
Patient family ordering lunch on a soft diet.

## 2017-09-17 NOTE — Progress Notes (Addendum)
PROGRESS NOTE    Gregory [name]  WNI:[id number] DOB: [birth year] DOA: 09/14/2017 PCP: Darreld Mclean, MD    Brief Narrative:  84 year old male who presented with dyspnea. He does havethesignificant past medical history for coronary arterydisease status post CABG, 2003, chronic atrial fibrillation, carotid artery disease, hypertension, ischemic cardiomyopathy, dyslipidemia, and history of CVA. Patient developed acute dyspnea, severeinintensity, oxygen saturation was founddownto 88% on room air. Patient wasnoted to be in respiratory distress, and he was placed on noninvasive mechanical ventilation. Physical examination blood pressure 139/63, heart rate 87, respirate 23, oxygen saturation 99%(on Bipap), moist mucous membranes, lungs with decreased breath sounds bilaterally, heart is S1-S2 present and rhythmic, tachycardic, abdomen soft nontender, no lower extremity edema. Sodium 141, potassium 4.4, chloride 110, bicarb 25, glucose 124, BUN 19, creatinine 1.58, BNP 413, troponin 0.08,white cells 8.1, hemoglobin 9.5, hematocrit 36.3, platelets 211,chest x-ray with cardiomegaly, increased interstitial markings bilaterally,peribronchial cuffing.EKG with right axis deviation, multiple PVCs, atrial fibrillation, low voltage.  Patient was admitted to the hospital withtheworking diagnosis of acute decompensatedsystolicheart failure.  Assessment & Plan:   Principal Problem:   Acute respiratory failure (HCC) Active Problems:   CAD (coronary artery disease),CABG 1993-LIMA to the LAD, SVG to Om and SVG to PDA/PLA, patent on cath 2014.   Chronic a-fib (HCC)   Anticoagulated on Eliquis   Chronic renal failure, stage 3 (moderate) (HCC)   Acute on chronic systolic heart failure (HCC)   Esophageal obstruction due to food impaction  1. Acute on chronic systolic heart failure/ ischemic cardiomyopathy. Patient has been successfully diuresed with IV furosemide, continue to be euvelemic,  will continue furosemide 40 mg po daily at discharge, continue heart failure management with carvedilol and lisinopril. Ejection fraction on the left ventricle 25 to 30%.   2. Ischemic cardiomyopathy with worsening LV systolic function. New precordial T wave inversions, no active chest pain. Further work up with cardiac catheterization with coronary angiography showed 3 vessel occlusive CAD graft dependent, with patent grafts. Continue apixaban for anticoagulation and current medical therapy with asa and statin.   3. Chronic atrial fibrillation. Rate control with carvedilol and anticoagulation with apixaban.   4. CKD stage IV. Renal function with serum cr at 1,5, will follow on renal panel in am, has isotonic saline per protocol peri contrast exposure. K at 3,4 and serum bicarbonate at 29.    5. HTN. Continue blood pressure control with metoprolol and lisinopril.   6. New esophageal impaction. Sp urgent endoscopy, will follow on speech and swallow evaluation.   DVT prophylaxis: apixaban   Code Status: full Family Communication: I spoke with patient's wife at the bedside and all questions were addressed.  Disposition Plan: home in am.     Consultants:     Procedures:     Antimicrobials:        Subjective: Patient last night had episode of food impaction, that required upper endoscopy, it has happen in the past. Will have speech evaluation on this admission. Dyspnea continue to improve, no chest pain or edema. For cardiac catheterization this am.   Objective: Vitals:   09/17/17 0127 09/17/17 0418 09/17/17 0820 09/17/17 0912  BP: (!) 140/54 (!) 148/67 127/69   Pulse: 61 69 61   Resp: 18 20 (!) 22   Temp: 98.4 F (36.9 C) 97.7 F (36.5 C) 98.1 F (36.7 C)   TempSrc: Oral Oral Oral   SpO2: 93% 100% 94% 95%  Weight:  79.7 kg (175 lb 11.2 oz)    Height:  Intake/Output Summary (Last 24 hours) at 09/17/2017 0936 Last data filed at 09/17/2017 0848 Gross per 24  hour  Intake 518.43 ml  Output 990 ml  Net -471.57 ml   Filed Weights   09/15/17 0538 09/16/17 0300 09/17/17 0418  Weight: 80.5 kg (177 lb 8 oz) 81.4 kg (179 lb 8 oz) 79.7 kg (175 lb 11.2 oz)    Examination:   General: deconditioned, not in pain or dyspnea.  Neurology: Awake and alert, non focal  E ENT: mild pallor, no icterus, oral mucosa moist Cardiovascular: No JVD. S1-S2 present, rhythmic, no gallops, rubs, or murmurs. Trace lower extremity edema. Pulmonary: positive breath sounds bilaterally, adequate air movement, no wheezing, rhonchi or rales. Gastrointestinal. Abdomen with no organomegaly, non tender, no rebound or guarding Skin. No rashes Musculoskeletal: no joint deformities     Data Reviewed: I have personally reviewed following labs and imaging studies  CBC: Recent Labs  Lab 09/14/17 0931 09/15/17 0641 09/17/17 0522  WBC 8.1 6.6 6.9  NEUTROABS 5.3  --   --   HGB 11.5* 9.2* 9.7*  HCT 36.3* 28.9* 30.2*  MCV 100.3* 98.6 98.4  PLT 211 178 440   Basic Metabolic Panel: Recent Labs  Lab 09/14/17 0931 09/15/17 0641 09/16/17 0616 09/17/17 0522  NA 141 137 142 141  K 4.4 3.8 3.7 3.4*  CL 110 103 104 105  CO2 25 29 31 29   GLUCOSE 124* 129* 101* 94  BUN 19 23 25* 24*  CREATININE 1.58* 1.67* 1.62* 1.50*  CALCIUM 8.6* 8.1* 8.3* 8.3*   GFR: Estimated Creatinine Clearance: 37.7 mL/min (A) (by C-G formula based on SCr of 1.5 mg/dL (H)). Liver Function Tests: Recent Labs  Lab 09/14/17 0931  AST 26  ALT 15  ALKPHOS 83  BILITOT 1.2  PROT 7.1  ALBUMIN 3.3*   No results for input(s): LIPASE, AMYLASE in the last 168 hours. No results for input(s): AMMONIA in the last 168 hours. Coagulation Profile: Recent Labs  Lab 09/16/17 1719  INR 1.04   Cardiac Enzymes: Recent Labs  Lab 09/14/17 1354 09/16/17 1133  TROPONINI 0.08* <0.03   BNP (last 3 results) No results for input(s): PROBNP in the last 8760 hours. HbA1C: No results for input(s): HGBA1C in  the last 72 hours. CBG: Recent Labs  Lab 09/16/17 2224 09/17/17 0128  GLUCAP 168* 104*   Lipid Profile: No results for input(s): CHOL, HDL, LDLCALC, TRIG, CHOLHDL, LDLDIRECT in the last 72 hours. Thyroid Function Tests: No results for input(s): TSH, T4TOTAL, FREET4, T3FREE, THYROIDAB in the last 72 hours. Anemia Panel: No results for input(s): VITAMINB12, FOLATE, FERRITIN, TIBC, IRON, RETICCTPCT in the last 72 hours.    Radiology Studies: I have reviewed all of the imaging during this hospital visit personally     Scheduled Meds: . [MAR Hold] aspirin EC  81 mg Oral Daily  . [MAR Hold] carvedilol  3.125 mg Oral BID WC  . [MAR Hold] lisinopril  5 mg Oral Daily  . [MAR Hold] mouth rinse  15 mL Mouth Rinse BID  . [MAR Hold] pantoprazole  40 mg Oral BID AC  . [MAR Hold] rOPINIRole  0.25 mg Oral BID  . [MAR Hold] rosuvastatin  40 mg Oral q1800  . [MAR Hold] sodium chloride flush  3 mL Intravenous Q12H   Continuous Infusions: . [MAR Hold] sodium chloride 10 mL/hr at 09/17/17 0156  . sodium chloride 50 mL/hr at 09/17/17 0543  . heparin 1,000 Units/hr (09/17/17 0624)     LOS: 3  days        Tawni Millers, MD [facility] Pager [phone]

## 2017-09-17 NOTE — Plan of Care (Signed)
  Problem: Nutrition: Goal: Adequate nutrition will be maintained Outcome: Progressing   Problem: Coping: Goal: Level of anxiety will decrease Outcome: Progressing   Problem: Activity: Goal: Capacity to carry out activities will improve Outcome: Progressing   Problem: Cardiac: Goal: Ability to achieve and maintain adequate cardiopulmonary perfusion will improve Outcome: Progressing

## 2017-09-17 NOTE — Telephone Encounter (Signed)
-----   Message from Milus Banister, MD sent at 09/17/2017  1:06 AM EDT ----- He needs rov with GEssner or extender in 3-4 weeks after esophageal food impaction, while admitted for CHF exacerbation.  thanks

## 2017-09-17 NOTE — Progress Notes (Signed)
Progress Note  Patient Name: Gregory [name] Date of Encounter: 09/17/2017  Primary Cardiologist: Shelva Majestic, MD   Subjective   Seen post cath, reviewed results with him (below). He denies any chest pain and states that his breathing is much improved. He tolerated radial cath well, and he understands plan to start back on apixaban this evening. He is anxious to eat and ambulate and is looking forward to going home in the near future.  Inpatient Medications    Scheduled Meds: . apixaban  2.5 mg Oral BID  . [START ON 09/18/2017] aspirin EC  81 mg Oral Daily  . carvedilol  3.125 mg Oral BID WC  . lisinopril  5 mg Oral Daily  . mouth rinse  15 mL Mouth Rinse BID  . pantoprazole  40 mg Oral BID AC  . rOPINIRole  0.25 mg Oral BID  . rosuvastatin  40 mg Oral q1800  . sodium chloride flush  3 mL Intravenous Q12H  . sodium chloride flush  3 mL Intravenous Q12H   Continuous Infusions: . sodium chloride 10 mL/hr at 09/17/17 0156  . sodium chloride 50 mL/hr at 09/17/17 0543  . sodium chloride    . sodium chloride 1 mL/kg/hr (09/17/17 1135)   PRN Meds: sodium chloride, sodium chloride, acetaminophen, ondansetron (ZOFRAN) IV, sodium chloride flush, sodium chloride flush, traMADol   Vital Signs    Vitals:   09/17/17 0957 09/17/17 0957 09/17/17 1002 09/17/17 1024  BP:  132/75 133/61 (!) 131/48  Pulse: 74 (!) 111 63 68  Resp: (!) 21 16 19    Temp:    98.4 F (36.9 C)  TempSrc:    Oral  SpO2: 100% 98% 99% 99%  Weight:      Height:        Intake/Output Summary (Last 24 hours) at 09/17/2017 1202 Last data filed at 09/17/2017 0848 Gross per 24 hour  Intake 518.43 ml  Output 990 ml  Net -471.57 ml   Filed Weights   09/15/17 0538 09/16/17 0300 09/17/17 0418  Weight: 177 lb 8 oz (80.5 kg) 179 lb 8 oz (81.4 kg) 175 lb 11.2 oz (79.7 kg)    Telemetry    Atrial fibrillation - Personally Reviewed  ECG    No new since yesterday  Physical Exam   GEN: No acute distress.   Neck:  supple, JVD just above clavicle at 30 degrees Cardiac: regular S1 and S2, no murmurs, rubs, or gallops.  Respiratory: Clear to auscultation bilaterally. GI: Soft, nontender, non-distended. Bowel sounds normal MS: No edema; No deformity. Left radial cath site with TR band in place Neuro:  Nonfocal, moves all limbs independently Psych: Normal affect   Labs    Chemistry Recent Labs  Lab 09/14/17 0931 09/15/17 0641 09/16/17 0616 09/17/17 0522  NA 141 137 142 141  K 4.4 3.8 3.7 3.4*  CL 110 103 104 105  CO2 25 29 31 29   GLUCOSE 124* 129* 101* 94  BUN 19 23 25* 24*  CREATININE 1.58* 1.67* 1.62* 1.50*  CALCIUM 8.6* 8.1* 8.3* 8.3*  PROT 7.1  --   --   --   ALBUMIN 3.3*  --   --   --   AST 26  --   --   --   ALT 15  --   --   --   ALKPHOS 83  --   --   --   BILITOT 1.2  --   --   --   GFRNONAA 39*  36* 38* 41*  GFRAA 45* 42* 44* 48*  ANIONGAP 6 5 7 7      Hematology Recent Labs  Lab 09/14/17 0931 09/15/17 0641 09/17/17 0522  WBC 8.1 6.6 6.9  RBC 3.62* 2.93* 3.07*  HGB 11.5* 9.2* 9.7*  HCT 36.3* 28.9* 30.2*  MCV 100.3* 98.6 98.4  MCH 31.8 31.4 31.6  MCHC 31.7 31.8 32.1  RDW 15.9* 15.7* 15.4  PLT 211 178 190    Cardiac Enzymes Recent Labs  Lab 09/14/17 1354 09/16/17 1133  TROPONINI 0.08* <0.03    Recent Labs  Lab 09/14/17 0938  TROPIPOC 0.01     BNP Recent Labs  Lab 09/14/17 0931  BNP 413.7*     DDimer No results for input(s): DDIMER in the last 168 hours.   Radiology    No results found.  Cardiac Studies   Cath today:  Prox RCA to Mid RCA lesion is 100% stenosed.  1st RPLB lesion is 100% stenosed.  Prox Cx to Mid Cx lesion is 100% stenosed.  Prox LAD lesion is 100% stenosed.  LIMA graft was visualized by angiography and is normal in caliber.  The graft exhibits no disease.  SVG graft was visualized by angiography and is normal in caliber.  Prox Graft lesion is 45% stenosed.  Seq SVG- PDA and PLOM graft was visualized by  angiography and is normal in caliber.  LV end diastolic pressure is normal.   1. 3 vessel occlusive CAD- graft dependent. 2. Patent LIMA to the LAD 3. Patent SVG to OM2 4. Patent sequential SVG to PDA/PLOM 5. Normal LV filling pressures 6. Normal right heart pressures and cardiac output.  Plan: continue medical therapy. Resume anticoagulation. Will resume Eliquis 2.5 mg bid this evening.   No indication for antiplatelet therapy at this time.  Patient Profile     84 y.o. male with a PMH of CAD s/p CABG 2003 (LIMA-LAD, SVG-PM, sequential SVG to PDA and PLA of RCA), ICM, carotid stenosis, atrial fibrillation anticoagulated on apixaban, multiple arterial emboli (spleen and kidney), CVA who was seen for evaluation of shortness of breath at the request of Dr. Cathlean Sauer.  Assessment & Plan    1. Acute on chronic sysotlic HF - history of ICM, severe LV dysfunction that had recovered as was normal as recent as 2017 - drop in LVEF from normal to 25-30% with new WMA on 09/14/17 echo - clininc notes lisinopril lowered to 5mg  due to hypotension. Beta blocker limited due to bradycardia - s/p R/LHC as above. Normal filling pressures, normal cardiac output. Occlusive native CAD with patent CABGs - restart apixaban at 2.5 mg BID starting this evening.  -stopped aspirin given no new acute lesions, unclear reason for drop in Hgb and need for chronic anticoagulation due to prior arterial emboli -follow up Cr, Hgb per primary team   2. History of arterial emboli Multiple prior events, including when just holding xarelto for procedure - bridged on heparin to cath, restart apixaban this evening.  3. CAD - known history of disease, prior CABG - acute drop in LVEF, new deep lateral TWIs. Mild trop thus far. No chest pain - LHC with patent grafts and native occlusive disease as above -continue rosuvastatin -no aspirin given need for chronic anticoagulation, unclear reason for drop in Hgb since  admission  4. Permanent afib - rate control with coreg, has had prior issues with bradycardia. Notes indicate history of sick sinus. Brady on tele at times, no symptoms. In setting of systolic dysfunction and  CAD will continue beta blocker -anticoagulation as above  5. Stage IV CKD - baseline Cr 1.6-2  6. Anemia - Hgb from 11.5 on admit to 9.2, unclear etiology - follow Hgb post cath   Time Spent Directly with Patient: I have spent a total of >35 minutes with the patient reviewing hospital notes, telemetry, EKGs, labs and examining the patient as well as establishing an assessment and plan that was discussed personally with the patient.  > 50% of time was spent in direct patient care.  Length of Stay:  LOS: 3 days   Buford Dresser, MD, PhD [facility]  [facility]   09/17/2017, 12:02 PM  [facility] will sign off in anticipation of discharge in the next 24 hours.   Medication Recommendations:  Resume home meds as above Other recommendations (labs, testing, etc):  Repeat CBC, BMET prior to discharge Follow up as an outpatient:  Dr. Claiborne Billings or his associate within 2-4 weeks post discharge  For questions or updates, please contact [facility] Please consult [email or web address] for contact info under Cardiology/STEMI.

## 2017-09-18 DIAGNOSIS — N179 Acute kidney failure, unspecified: Secondary | ICD-10-CM

## 2017-09-18 LAB — BASIC METABOLIC PANEL
Anion gap: 6 (ref 5–15)
BUN: 20 mg/dL (ref 8–23)
CHLORIDE: 107 mmol/L (ref 98–111)
CO2: 26 mmol/L (ref 22–32)
Calcium: 8.5 mg/dL — ABNORMAL LOW (ref 8.9–10.3)
Creatinine, Ser: 1.29 mg/dL — ABNORMAL HIGH (ref 0.61–1.24)
GFR calc non Af Amer: 50 mL/min — ABNORMAL LOW (ref >60)
GFR, EST AFRICAN AMERICAN: 57 mL/min — AB (ref >60)
Glucose, Bld: 122 mg/dL — ABNORMAL HIGH (ref 70–99)
POTASSIUM: 4.2 mmol/L (ref 3.5–5.1)
SODIUM: 139 mmol/L (ref 135–145)

## 2017-09-18 LAB — CBC
HEMATOCRIT: 33 % — AB (ref 39.0–52.0)
HEMOGLOBIN: 10.5 g/dL — AB (ref 13.0–17.0)
MCH: 31.7 pg (ref 26.0–34.0)
MCHC: 31.8 g/dL (ref 30.0–36.0)
MCV: 99.7 fL (ref 78.0–100.0)
Platelets: 196 10*3/uL (ref 150–400)
RBC: 3.31 MIL/uL — AB (ref 4.22–5.81)
RDW: 15.5 % (ref 11.5–15.5)
WBC: 7.1 10*3/uL (ref 4.0–10.5)

## 2017-09-18 MED ORDER — FUROSEMIDE 40 MG PO TABS: 40.0000 mg | ORAL_TABLET | Freq: Every day | ORAL | 0 refills | Status: DC

## 2017-09-18 MED ORDER — PANTOPRAZOLE SODIUM 40 MG PO TBEC: 40.0000 mg | DELAYED_RELEASE_TABLET | Freq: Two times a day (BID) | ORAL | 0 refills | Status: DC

## 2017-09-18 NOTE — Evaluation (Signed)
Clinical/Bedside Swallow Evaluation Patient Details  Name: Gregory [name] MRN: [id number] Date of Birth: September 21, 1932  Today's Date: 09/18/2017 Time: SLP Start Time (ACUTE ONLY): 1055 SLP Stop Time (ACUTE ONLY): 1103 SLP Time Calculation (min) (ACUTE ONLY): 8 min  Past Medical History:  Past Medical History:  Diagnosis Date  . Acute respiratory failure ([place])   . Appendicitis with abscess 03/31/2011  . Arthritis   . CAD (coronary artery disease),CABG 1993-LIMA to the LAD, SVG to Om and SVG to PDA/PLA, patent on cath 2008. 2003   a. s/p CABG 2003. b. last cath 2008 with patent grafts.  . Chronic atrial fibrillation (HCC)    permanent  . Chronic systolic CHF (congestive heart failure) ([place])    a. Prior low EF, later normalized.  . Essential hypertension   . GERD (gastroesophageal reflux disease)   . Heart murmur   . Hyperlipidemia   . Ischemic cardiomyopathy   . Kidney stones    "passed all but one time when he had to have lithotripsy" (01/21/2013)  . Pulmonary hypertension ([place])   . 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 07/2015 - after holding Xarelto x 3 days for spinal procedure.  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 SSS (sick sinus syndrome) ([place])   . Stroke [facility]) 2007   "slightly drags left foot since; recovered qthing else" (01/21/2013)  . Syncope 07/2015   a. felt vasovagal in setting of pain from renal infarct.   Past Surgical History:  Past Surgical History:  Procedure Laterality Date  . BALLOON DILATION N/A 05/22/2015   Procedure: BALLOON DILATION;  Surgeon: Gatha Mayer, MD;  Location: [facility] ENDOSCOPY;  Service: Endoscopy;  Laterality: N/A;  . CARDIAC CATHETERIZATION  02/24/2002   reduced LV function, 60-70% prox RCA stenosis, 70% PLA ostial stenosis, 80% secondary branch of PLA stenosis - subsequent CABG (Dr. Jackie Plum)  . Carotid Doppler  06/2012   50-69% right bulb/prox ICA diameter reduction; 70-99% left bulb/prox ICA diameter reduction  . CATARACT EXTRACTION W/ INTRAOCULAR LENS IMPLANT  Bilateral 2013  . COLONOSCOPY N/A 05/18/2016   Procedure: COLONOSCOPY;  Surgeon: Manus Gunning, MD;  Location: [facility] ENDOSCOPY;  Service: Gastroenterology;  Laterality: N/A;  . CORONARY ANGIOPLASTY  07/05/2006   3 vessel CAD, patent LIMA to LAD, patentVG to OM, patent SVG to PDA & PLA, mild MR, severe LV systolic dysfunction (Dr. Gerrie Nordmann)  . CORONARY ARTERY BYPASS GRAFT  03/01/2002   LIMA to LAD, reverse SVG to OM, reverse SVG to PDA of RCA, reverse SVG to PLA of RCA, ligation of LA appendage (Dr. Servando Snare)  . ENTEROSCOPY N/A 05/20/2016   Procedure: ENTEROSCOPY;  Surgeon: Manus Gunning, MD;  Location: [place];  Service: Gastroenterology;  Laterality: N/A;  . ESOPHAGOGASTRODUODENOSCOPY  02/01/2012   Procedure: ESOPHAGOGASTRODUODENOSCOPY (EGD);  Surgeon: Beryle Beams, MD;  Location: [facility] ENDOS[facility];  Service: Endoscopy;  Laterality: N/A;  . ESOPHAGOGASTRODUODENOSCOPY N/A 05/22/2015   Procedure: ESOPHAGOGASTRODUODENOSCOPY (EGD);  Surgeon: Gatha Mayer, MD;  Location: [facility] ENDOSCOPY;  Service: Endoscopy;  Laterality: N/A;  . ESOPHAGOGASTRODUODENOSCOPY N/A 05/17/2016   Procedure: ESOPHAGOGASTRODUODENOSCOPY (EGD);  Surgeon: Juanita Craver, MD;  Location: [facility] ENDOSCOPY;  Service: Endoscopy;  Laterality: N/A;  . ESOPHAGOGASTRODUODENOSCOPY N/A 09/17/2017   Procedure: ESOPHAGOGASTRODUODENOSCOPY (EGD);  Surgeon: Milus Banister, MD;  Location: [facility] ENDOSCOPY;  Service: Endoscopy;  Laterality: N/A;  . FOREIGN BODY REMOVAL  09/17/2017   Procedure: FOREIGN BODY REMOVAL;  Surgeon: Milus Banister, MD;  Location: [facility] ENDOSCOPY;  Service: Endoscopy;;  . Freda Munro  CAPSULE STUDY N/A 05/18/2016   Procedure: GIVENS CAPSULE STUDY;  Surgeon: Manus Gunning, MD;  Location: [place];  Service: Gastroenterology;  Laterality: N/A;  . LAPAROSCOPIC APPENDECTOMY  03/30/2011   Procedure: APPENDECTOMY LAPAROSCOPIC;  Surgeon: Joyice Faster. Cornett, MD;  Location: [place];  Service: General;  Laterality: N/A;  . LEFT HEART  CATHETERIZATION WITH CORONARY ANGIOGRAM N/A 01/24/2013   Procedure: LEFT HEART CATHETERIZATION WITH CORONARY ANGIOGRAM;  Surgeon: [name], MD;  Location: [facility] CATH LAB;  Service: Cardiovascular;  Laterality: N/A;  Right heart with grafts  . LITHOTRIPSY     "once" (01/21/2013)  . NM MYOCAR PERF WALL MOTION  05/2009   persantine myoview - fixed moderate perfusion defect in inferior wall & lateral segment of apex (poor non-transmural infarction), minimal anterolateral periinfarct reversible ischemia seen, abnormal study, defects similar to 2006 study  . RIGHT/LEFT HEART CATH AND CORONARY ANGIOGRAPHY N/A 09/17/2017   Procedure: RIGHT/LEFT HEART CATH AND CORONARY ANGIOGRAPHY;  Surgeon: Martinique, Peter M, MD;  Location: [place] CV LAB;  Service: Cardiovascular;  Laterality: N/A;  . SPLENECTOMY, TOTAL  01/2013  . TEE WITHOUT CARDIOVERSION N/A 01/23/2013   Procedure: TRANSESOPHAGEAL ECHOCARDIOGRAM (TEE);  Surgeon: Sanda Klein, MD;  Location: [facility] ENDOSCOPY;  Service: Cardiovascular;  Laterality: N/A;  . TRANSTHORACIC ECHOCARDIOGRAM  06/23/2012   EF 40-45%, mild LVH; mild AV regurg; mild MV regurg; LV mod-severely dilated; RV mildly dilated; systolic pressure borderline increased; RA mod dilated   HPI:  84 year old male who presented with dyspnea.  Past medical history significant for coronary artery disease status post CABG, 2003, chronic atrial fibrillation, carotid artery disease, hypertension, ischemic cardiomyopathy, dyslipidemia, and history of CVA. Patient had acute esophageal impaction, required urgent endoscopy, a large bolus of food was removed from the mid to distal esophagus.  Focal inflamed stricture at the GE junction that was not dilated. Pt had esohageal dilation in 05/2015.   Assessment / Plan / Recommendation Clinical Impression   Patient's oropharyngeal swallow appears to be grossly within functional limits with adequate airway protection, though he remains at mild risk for  aspiration due to esophageal dysphagia. Pt did have immediate cough x1 with consecutive sips of thin liquids via straw, however he reports that he prefers not to use straws. There are no overt signs of aspiration with single or consecutive cup sips of thin liquids. Oral preparation, control and transit appear adequate and swallow initiation timely with thin liquids, purees and regular solids. SLP educated pt re: strategies to mitigate esophageal symptoms, including thorough mastication, small bites and sips, alternating solids with liquids. Pt reports he already avoids cold liquids in preference of room temperature liquids. Recommend regular diet with thin liquids, meds whole with liquid. Advised re: esophageal precautions including upright posture during and for 30 minutes after meals. Education completed and no further skilled ST follow-up recommended at this time. SLP will s/o.     SLP Visit Diagnosis: Dysphagia, unspecified (R13.10)    Aspiration Risk  Mild aspiration risk    Diet Recommendation Regular;Thin liquid   Liquid Administration via: Cup Medication Administration: Whole meds with liquid Supervision: Patient able to self feed Compensations: Slow rate;Small sips/bites;Follow solids with liquid Postural Changes: Seated upright at 90 degrees;Remain upright for at least 30 minutes after po intake    Other  Recommendations Oral Care Recommendations: Oral care BID   Follow up Recommendations None      Frequency and Duration            Prognosis Prognosis for Safe  Diet Advancement: Good      Swallow Study   General Date of Onset: 09/14/17 HPI: 84 year old male who presented with dyspnea.  Past medical history significant for coronary artery disease status post CABG, 2003, chronic atrial fibrillation, carotid artery disease, hypertension, ischemic cardiomyopathy, dyslipidemia, and history of CVA. Patient had acute esophageal impaction, required urgent endoscopy, a large bolus of  food was removed from the mid to distal esophagus.  Focal inflamed stricture at the GE junction that was not dilated. Pt had esohageal dilation in 05/2015. Type of Study: Bedside Swallow Evaluation Previous Swallow Assessment: see HPI Diet Prior to this Study: Regular;Thin liquids Temperature Spikes Noted: No Respiratory Status: Room air History of Recent Intubation: No Behavior/Cognition: Alert;Cooperative;Pleasant mood Oral Cavity Assessment: Within Functional Limits Oral Care Completed by SLP: No Oral Cavity - Dentition: Dentures, top;Dentures, bottom Vision: Functional for self-feeding Self-Feeding Abilities: Able to feed self Patient Positioning: Upright in bed Baseline Vocal Quality: Normal Volitional Cough: Strong Volitional Swallow: Able to elicit    Oral/Motor/Sensory Function Overall Oral Motor/Sensory Function: Within functional limits   Ice Chips Ice chips: Within functional limits   Thin Liquid Thin Liquid: Impaired Presentation: Cup;Straw Pharyngeal  Phase Impairments: Cough - Immediate    Nectar Thick Nectar Thick Liquid: Not tested   Honey Thick Honey Thick Liquid: Not tested   Puree Puree: Within functional limits Presentation: Self Fed;Spoon   Solid   GO   Solid: Within functional limits Presentation: [place], [place], [place] Speech-Language Pathologist [phone]  Aliene Altes 09/18/2017,11:10 AM

## 2017-09-18 NOTE — Progress Notes (Signed)
Discharge instructions given to patient, his daughter and wife at bedside. All questions answered.

## 2017-09-18 NOTE — Discharge Summary (Addendum)
Physician Discharge Summary  SYNCERE EBLE [id number]:[id number] DOB: 11-15-1932 DOA: 09/14/2017  PCP: Darreld Mclean, MD  Admit date: 09/14/2017 Discharge date: 09/18/2017  Admitted From: Home  Disposition:  Home   Recommendations for Outpatient Follow-up and new medication changes:  1. Follow up with Dr. Lorelei Pont in 7 days 2. Furosemide has been increased to 40 mg daily.  3. Instructed to increase furosemide to 40 mg po bid for 3 days in case of weight gain 3 lbs in 24 hours or 5 lbs in 7 days.  4. Placed on pantoprazole 40 mg po bid.  5. Instructed to chew his food well, eat slowly and take small bites.   Home Health: no   Equipment/Devices: no    Discharge Condition: stable  CODE STATUS: full  Diet recommendation: Heart healthy and diabetic prudent   Brief/Interim Summary: 84 year old male who presented with dyspnea. He does havethesignificant past medical history for coronary arterydisease status post CABG, 2003, chronic atrial fibrillation, carotid artery disease, hypertension, ischemic cardiomyopathy, dyslipidemia, and history of CVA. Patient developed acute dyspnea, severeinintensity, oxygen saturation was founddownto 88% on room air. Patient wasnoted to be in respiratory distress, and he was placed on noninvasive mechanical ventilation. Physical examination blood pressure 139/63, heart rate 87, respiratory rate 23, oxygen saturation 99%(on Bipap), moist mucous membranes, lungs with decreased breath sounds bilaterally, heart is S1-S2 present and rhythmic, tachycardic, abdomen soft nontender, no lower extremity edema. Sodium 141, potassium 4.4, chloride 110, bicarb 25, glucose 124, BUN 19, creatinine 1.58, BNP 413, troponin 0.08,white cells 8.1, hemoglobin 9.5, hematocrit 36.3, platelets 211,chest x-ray with cardiomegaly, increased interstitial markings bilaterally,peribronchial cuffing.EKG with right axis deviation, multiple PVCs, atrial fibrillation, low  voltage.  Patient was admitted to the hospital withtheworking diagnosis of acute decompensatedsystolicheart failure complicated with acute hypoxic respiratory failure.   1.Acute systolic heart failure decompensation /ischemic cardiomyopathy left ventricle ejection fraction 25 to 30%. Patient was admitted to the medical ward, he was placed on a remote telemetry monitor, received aggressive diuresis with IV furosemide. Negative fluid balance was achieved with significant improvement of his symptoms. Further work-up with echocardiography showed a reduction in systolic LV function to 25 to 30%with diffuse hypokinesis, akinesis of the mid-apical anterior septal and apical myocardium. Decreased form 55-60% back in 2017.   Patient will continue lisinopril for ACE inhibitor and carvedilol for beta blockade. His discharge weight is 80.9 kg / 178 pounds, he was instructed to increase furosemide to twice daily for 3 days if weight gain of 3 pounds in 24 hours or 5 pounds and 7 days.  Not on aspirin due increased risk of bleeding on apixaban, continue Rosuvastatin 40 mg daily  2.  Coronary artery disease, status post bypass grafting in 2003.  Patient remained chest pain-free, peak troponin I 0.08, he had serial electrocardiograms which showed new septal and lateral T wave inversions, with chronic Q waves in V2 V3 and V4.  Considering worsening LV systolic function along with electrocardiographic changes patient underwent further ischemic work-up with coronary angiography, which showed three-vessel occlusive CAD, graft dependent.  Patent LIMA to LAD, patent SVG to OM2, patent sequential SVG to PDA/ PLOM, normal LV filling pressures.  Recommendations to continue medical therapy.  No aspirin due to increased risk of bleeding.  Continue rosuvastatin.  3.  Chronic atrial fibrillation.  Remain well controlled with carvedilol, anticoagulation with apixaban.  4.  AKI on chronic kidney disease 3.  Patient  received aggressive diuresis with IV furosemide, he had an improvement of his kidney  function, discharge creatinine 1.29, potassium 4.2, serum bicarbonate 26.  Patient will be discharged with increased dose of furosemide from 20 mg to 40 mg daily.  5.  Hypertension.  Continue low-dose lisinopril and carvedilol.  Discharge blood pressure 155 mmHg.   6.  Acute esophageal food impaction due to inflamed gastroesophageal junction.  Patient had acute esophageal impaction, required urgent endoscopy, a large bolus of food was removed from the mid to distal esophagus.  Focal inflamed stricture at the GE junction that was not dilated.  Recommendations to continue twice daily pantoprazole, instruction to chew his food well, eat slowly and take small bites.    Discharge Diagnoses:  Principal Problem:   Acute respiratory failure ([place]) Active Problems:   CAD (coronary artery disease),CABG 1993-LIMA to the LAD, SVG to Om and SVG to PDA/PLA, patent on cath 2014.   Chronic a-fib (HCC)   Anticoagulated on Eliquis   Chronic renal failure, stage 3 (moderate) (HCC)   Acute on chronic systolic heart failure (HCC)   Esophageal obstruction due to food impaction   Acute congestive heart failure [facility])    Discharge Instructions   Allergies as of 09/18/2017      Reactions   Percocet [oxycodone-acetaminophen] Itching, Nausea Only      Medication List    STOP taking these medications   traMADol 50 MG tablet Commonly known as:  ULTRAM     TAKE these medications   apixaban 2.5 MG Tabs tablet Commonly known as:  ELIQUIS Take 1 tablet (2.5 mg total) by mouth 2 (two) times daily. FOLLOW UP WITH CARDIOLOGY PRIOR TO NEXT REFILL AUTHORIZATION   carvedilol 3.125 MG tablet Commonly known as:  COREG TAKE 1 TABLET(3.125 MG) BY MOUTH TWICE DAILY WITH A MEAL   fluticasone 50 MCG/ACT nasal spray Commonly known as:  FLONASE Place 2 sprays into both nostrils daily.   furosemide 40 MG tablet Commonly known as:   LASIX Take 1 tablet (40 mg total) by mouth daily. What changed:    how much to take  how to take this  when to take this  additional instructions   guaiFENesin 600 MG 12 hr tablet Commonly known as:  MUCINEX Take 600 mg by mouth 2 (two) times daily as needed for cough or to loosen phlegm.   ipratropium 0.03 % nasal spray Commonly known as:  ATROVENT Place 2 sprays into the nose 4 (four) times daily.   lisinopril 5 MG tablet Commonly known as:  PRINIVIL,ZESTRIL Take 1 tablet (5 mg total) by mouth daily.   pantoprazole 40 MG tablet Commonly known as:  PROTONIX Take 1 tablet (40 mg total) by mouth 2 (two) times daily before a meal. What changed:  when to take this   rOPINIRole 0.25 MG tablet Commonly known as:  REQUIP TAKE 1 TABLET(0.25 MG) BY MOUTH TWICE DAILY   rosuvastatin 40 MG tablet Commonly known as:  CRESTOR TAKE 1 TABLET(40 MG) BY MOUTH DAILY       Allergies  Allergen Reactions  . Percocet [Oxycodone-Acetaminophen] Itching and Nausea Only    Consultations:  Cardiology   Gastroenterology    Procedures/Studies: Dg Chest Port 1 View  Result Date: 09/14/2017 CLINICAL DATA:  Shortness of breath. EXAM: PORTABLE CHEST 1 VIEW COMPARISON:  Radiographs of May 15, 2016. FINDINGS: Stable cardiomegaly. Atherosclerosis of thoracic aorta is noted. Status post coronary artery bypass graft. No pneumothorax or pleural effusion is noted. Increased right perihilar and basilar lung opacities are noted concerning for edema or possibly inflammation. Bony thorax  is unremarkable. IMPRESSION: Increased right perihilar and basilar lung opacities are noted concerning for pulmonary edema or possibly pneumonia. Electronically Signed   By: Marijo Conception, M.D.   On: 09/14/2017 09:55       Subjective: Patient is feeling better, back to his baseline, no nausea or vomiting, no chest pain or dyspnea, no pnd or orthopnea.   Discharge Exam: Vitals:   09/18/17 0515 09/18/17 0820   BP: (!) 126/48 (!) 155/74  Pulse: 65 69  Resp: 18   Temp: 98.4 F (36.9 C)   SpO2: 95%    Vitals:   09/17/17 1930 09/18/17 0403 09/18/17 0515 09/18/17 0820  BP: (!) 124/51  (!) 126/48 (!) 155/74  Pulse: (!) 51  65 69  Resp: 18  18   Temp: 97.7 F (36.5 C)  98.4 F (36.9 C)   TempSrc: Oral  Oral   SpO2: 96%  95%   Weight:  80.9 kg (178 lb 6.4 oz)    Height:        General: Not in pain or dyspnea Neurology: Awake and alert, non focal  E ENT: no pallor, no icterus, oral mucosa moist Cardiovascular: No JVD. S1-S2 present, rhythmic, no gallops, rubs, or murmurs. No lower extremity edema. Pulmonary: Positive breath sounds bilaterally, adequate air movement, no wheezing, rhonchi or rales. Gastrointestinal. Abdomen with no organomegaly, non tender, no rebound or guarding Skin. No rashes Musculoskeletal: no joint deformities   The results of significant diagnostics from this hospitalization (including imaging, microbiology, ancillary and laboratory) are listed below for reference.     Microbiology: No results found for this or any previous visit (from the past 240 hour(s)).   Labs: BNP (last 3 results) Recent Labs    09/14/17 0931  BNP 295.6*   Basic Metabolic Panel: Recent Labs  Lab 09/14/17 0931 09/15/17 0641 09/16/17 0616 09/17/17 0522 09/18/17 0533  NA 141 137 142 141 139  K 4.4 3.8 3.7 3.4* 4.2  CL 110 103 104 105 107  CO2 25 29 31 29 26   GLUCOSE 124* 129* 101* 94 122*  BUN 19 23 25* 24* 20  CREATININE 1.58* 1.67* 1.62* 1.50* 1.29*  CALCIUM 8.6* 8.1* 8.3* 8.3* 8.5*   Liver Function Tests: Recent Labs  Lab 09/14/17 0931  AST 26  ALT 15  ALKPHOS 83  BILITOT 1.2  PROT 7.1  ALBUMIN 3.3*   No results for input(s): LIPASE, AMYLASE in the last 168 hours. No results for input(s): AMMONIA in the last 168 hours. CBC: Recent Labs  Lab 09/14/17 0931 09/15/17 0641 09/17/17 0522 09/18/17 0533  WBC 8.1 6.6 6.9 7.1  NEUTROABS 5.3  --   --   --   HGB  11.5* 9.2* 9.7* 10.5*  HCT 36.3* 28.9* 30.2* 33.0*  MCV 100.3* 98.6 98.4 99.7  PLT 211 178 190 196   Cardiac Enzymes: Recent Labs  Lab 09/14/17 1354 09/16/17 1133  TROPONINI 0.08* <0.03   BNP: Invalid input(s): POCBNP CBG: Recent Labs  Lab 09/16/17 2224 09/17/17 0128  GLUCAP 168* 104*   D-Dimer No results for input(s): DDIMER in the last 72 hours. Hgb A1c No results for input(s): HGBA1C in the last 72 hours. Lipid Profile No results for input(s): CHOL, HDL, LDLCALC, TRIG, CHOLHDL, LDLDIRECT in the last 72 hours. Thyroid function studies No results for input(s): TSH, T4TOTAL, T3FREE, THYROIDAB in the last 72 hours.  Invalid input(s): FREET3 Anemia work up No results for input(s): VITAMINB12, FOLATE, FERRITIN, TIBC, IRON, RETICCTPCT in the last 43  hours. Urinalysis    Component Value Date/Time   COLORURINE YELLOW 05/15/2016 [place] 05/15/2016 1631   LABSPEC 1.013 05/15/2016 1631   PHURINE 5.5 05/15/2016 1631   GLUCOSEU NEGATIVE 05/15/2016 1631   HGBUR 1+ (A) 05/15/2016 1631   BILIRUBINUR NEGATIVE 05/15/2016 1631   BILIRUBINUR negative 01/20/2013 1623   KETONESUR NEGATIVE 05/15/2016 1631   PROTEINUR 1+ (A) 05/15/2016 1631   UROBILINOGEN 0.2 01/20/2013 1623   UROBILINOGEN 1.0 03/30/2011 2224   NITRITE NEGATIVE 05/15/2016 1631   LEUKOCYTESUR NEGATIVE 05/15/2016 1631   Sepsis Labs Invalid input(s): PROCALCITONIN,  WBC,  LACTICIDVEN Microbiology No results found for this or any previous visit (from the past 240 hour(s)).   Time coordinating discharge: 45 minutes  SIGNED:   Tawni Millers, MD  [facility] 09/18/2017, 9:19 AM Pager [phone]  If 7PM-7AM, please contact night-coverage [email or web address] Password [email or web address]

## 2017-09-20 ENCOUNTER — Telehealth: Payer: Self-pay

## 2017-09-20 NOTE — Telephone Encounter (Signed)
09/20/17  Transition Care Management Follow-up Telephone Call  ADMISSION DATE: 09/14/17 DISCHARGE DATE: 09/18/17   How have you been since you were released from the hospital? Feeling fine per patient. Do you understand why you were in the hospital? Yes   Do you understand the discharge instrcutions? Yes    Items Reviewed:  Medications reviewed: Yes    Allergies reviewed: Yes   Dietary changes reviewed:  Referrals reviewed: Appointment scheduled with Dr. Lorelei Pont.  Functional Questionnaire:   Activities of Daily Living (ADLs): Patient can perform all ADL'S independently.  Any patient concerns? Runny nose that he would like adressed.   Confirmed importance and date/time of follow-up visits scheduled:Yes    Confirmed with patient if condition begins to worsen call PCP or go to the ER. Yes     Patient was given the office number and encouragred to call back with questions or concerns. Yes

## 2017-09-25 NOTE — Progress Notes (Signed)
[place] [facility] [address], [place], [place] [address] [phone]  Date:  09/29/2017   Name:  Gregory [name]   DOB:  12-Sep-1932   MRN:  [id number]  PCP:  Darreld Mclean, MD    Chief Complaint: Hospitalization Follow-up (CHF, 09/13/17)   History of Present Illness:  Gregory [name] is a 84 y.o. very pleasant male patient who presents with the following:  Hospital follow-up today from recent admission as follows:  Admit date: 09/14/2017 Discharge date: 09/18/2017 Recommendations for Outpatient Follow-up and new medication changes:  1. Follow up with Dr. Lorelei Pont in 7 days 2. Furosemide has been increased to 40 mg daily.  3. Instructed to increase furosemide to 40 mg po bid for 3 days in case of weight gain 3 lbs in 24 hours or 5 lbs in 7 days.  4. Placed on pantoprazole 40 mg po bid.  5. Instructed to chew his food well, eat slowly and take small bites.   Brief/Interim Summary: 84 year old male who presented with dyspnea. He does havethesignificant past medical history for coronary arterydisease status post CABG, 2003, chronic atrial fibrillation, carotid artery disease, hypertension, ischemic cardiomyopathy, dyslipidemia, and history of CVA. Patient developed acute dyspnea, severeinintensity, oxygen saturation was founddownto 88% on room air. Patient wasnoted to be in respiratory distress, and he was placed on noninvasive mechanical ventilation. Physical examination blood pressure 139/63, heart rate 87, respiratory rate 23, oxygen saturation 99%(on Bipap), moist mucous membranes, lungs with decreased breath sounds bilaterally, heart is S1-S2 present and rhythmic, tachycardic, abdomen soft nontender, no lower extremity edema. Sodium 141, potassium 4.4, chloride 110, bicarb 25, glucose 124, BUN 19, creatinine 1.58, BNP 413, troponin 0.08,white cells 8.1, hemoglobin 9.5, hematocrit 36.3, platelets 211,chest x-ray  with cardiomegaly, increased interstitial markings bilaterally,peribronchial cuffing.EKG with right axis deviation, multiple PVCs, atrial fibrillation, low voltage. Patient was admitted to the hospital withtheworking diagnosis of acute decompensatedsystolicheart failure complicated with acute hypoxic respiratory failure.   1.Acute systolic heart failuredecompensation/ischemic cardiomyopathy left ventricle ejection fraction 25 to 30%. Patient was admitted to the medical ward, he was placed on a remote telemetry monitor, received aggressive diuresis with IV furosemide. Negative fluid balance was achieved with significant improvement of his symptoms. Further work-up with echocardiography showed a reduction in systolic LV function to 25 to 30%with diffuse hypokinesis, akinesis of the mid-apical anterior septal and apical myocardium. Decreased form 55-60% back in 2017.  Patient will continue lisinopril for ACE inhibitor and carvedilol for beta blockade. His discharge weight is 80.9 kg / 178 pounds, he was instructed to increase furosemide to twice daily for 3 days if weight gain of 3 pounds in 24 hours or 5 pounds and 7 days. Not on aspirin due increased risk of bleeding on apixaban, continue Rosuvastatin 40 mg daily 2.  Coronary artery disease, status post bypass grafting in 2003.  Patient remained chest pain-free, peak troponin I 0.08, he had serial electrocardiograms which showed new septal and lateral T wave inversions, with chronic Q waves in V2 V3 and V4.  Considering worsening LV systolic function along with electrocardiographic changes patient underwent further ischemic work-up with coronary angiography, which showed three-vessel occlusive CAD, graft dependent.  Patent LIMA to LAD, patent SVG to OM2, patent sequential SVG to PDA/ PLOM, normal LV filling pressures.  Recommendations to continue medical therapy.  No aspirin due to increased risk of bleeding.  Continue rosuvastatin. 3.   Chronic atrial fibrillation.  Remain well controlled  with carvedilol, anticoagulation with apixaban. 4.  AKI on chronic kidney disease 3.  Patient received aggressive diuresis with IV furosemide, he had an improvement of his kidney function, discharge creatinine 1.29, potassium 4.2, serum bicarbonate 26.  Patient will be discharged with increased dose of furosemide from 20 mg to 40 mg daily 5.  Hypertension.  Continue low-dose lisinopril and carvedilol.  Discharge blood pressure 155 mmHg.  6.  Acute esophageal food impaction due to inflamed gastroesophageal junction.  Patient had acute esophageal impaction, required urgent endoscopy, a large bolus of food was removed from the mid to distal esophagus.  Focal inflamed stricture at the GE junction that was not dilated.  Recommendations to continue twice daily pantoprazole, instruction to chew his food well, eat slowly and take small bites.   Discharge Diagnoses:  Principal Problem:   Acute respiratory failure ([place]) Active Problems:   CAD (coronary artery disease),CABG 1993-LIMA to the LAD, SVG to Om and SVG to PDA/PLA, patent on cath 2014.   Chronic a-fib (HCC)   Anticoagulated on Eliquis   Chronic renal failure, stage 3 (moderate) (HCC)   Acute on chronic systolic heart failure (HCC)   Esophageal obstruction due to food impaction   Acute congestive heart failure ([place])  He notes that since discharge to home his weight is holding "exactly the same, I weight every morning 175 lbs" Wt Readings from Last 3 Encounters:  09/29/17 179 lb (81.2 kg)  09/18/17 178 lb 6.4 oz (80.9 kg)  08/25/17 183 lb 9.6 oz (83.3 kg)   He needs an appt with cardiology for follow-up; he does not have an appt as of yet  He notes no further food getting stuck but he is going to see GI - he is a Financial controller pt, Carlean Purl is his primary GI doc. Has follow-up in August  He had a cath while inpt which was ok  No swelling of his legs He was out cutting bushes in his yard  yesterday His strength and breathing are back to baseline He uses one pillow No orthopnea He is sleeping well   He is taking some medication for sinuses- mucinex  He does not have glaucoma or urinary retention - he has atrovent nasal but we are not sure if he is taking this - recommended that he try it for his complaint of nasal drainage and running   Labs by the time of discharge showed mild renal insuf and hg of about 10   Patient Active Problem List   Diagnosis Date Noted  . Esophageal obstruction due to food impaction   . Acute congestive heart failure ([place])   . Acute respiratory failure ([place]) 09/14/2017  . Acute on chronic systolic heart failure ([place])   . Fatigue 08/06/2017  . Symptomatic anemia   . Chronic anemia 01/13/2016  . Chronic renal failure, stage 3 (moderate) ([place]) 08/06/2015  . Vasovagal syncope   . Coronary artery disease involving native coronary artery   . 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 Syncope 07/30/2015  . Esophageal stricture   . Carotid stenosis 07/13/2013  . Anticoagulated on Eliquis 03/21/2013  . PVD - 21% LICA, 19% RICA by doppler 5/14 01/24/2013  . 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 CAD (coronary artery disease),CABG 1993-LIMA to the LAD, SVG to Om and SVG to PDA/PLA, patent on cath 2014. 03/31/2011  . Chronic a-fib ([place]) 03/31/2011  . Cardiomyopathy, ischemic, improved 03/31/2011  . CVA (cerebral vascular accident) ([place])  03/31/2011  . GERD (gastroesophageal reflux disease) 03/31/2011  . Renal calculi 03/31/2011  . History of tobacco use, continues with chewing tobacco 03/31/2011    Past Medical History:  Diagnosis Date  . Acute respiratory failure ([place])   . Appendicitis with abscess 03/31/2011  . Arthritis   . CAD (coronary artery disease),CABG 1993-LIMA to the LAD, SVG to Om and SVG to PDA/PLA, patent on cath 2008. 2003   a. s/p CABG 2003. b. last cath 2008 with patent grafts.   . Chronic atrial fibrillation (HCC)    permanent  . Chronic systolic CHF (congestive heart failure) ([place])    a. Prior low EF, later normalized.  . Essential hypertension   . GERD (gastroesophageal reflux disease)   . Heart murmur   . Hyperlipidemia   . Ischemic cardiomyopathy   . Kidney stones    "passed all but one time when he had to have lithotripsy" (01/21/2013)  . Pulmonary hypertension ([place])   . 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 07/2015 - after holding Xarelto x 3 days for spinal procedure.  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 SSS (sick sinus syndrome) ([place])   . Stroke [facility]) 2007   "slightly drags left foot since; recovered qthing else" (01/21/2013)  . Syncope 07/2015   a. felt vasovagal in setting of pain from renal infarct.    Past Surgical History:  Procedure Laterality Date  . BALLOON DILATION N/A 05/22/2015   Procedure: BALLOON DILATION;  Surgeon: Gatha Mayer, MD;  Location: [facility] ENDOSCOPY;  Service: Endoscopy;  Laterality: N/A;  . CARDIAC CATHETERIZATION  02/24/2002   reduced LV function, 60-70% prox RCA stenosis, 70% PLA ostial stenosis, 80% secondary branch of PLA stenosis - subsequent CABG (Dr. Jackie Plum)  . Carotid Doppler  06/2012   50-69% right bulb/prox ICA diameter reduction; 70-99% left bulb/prox ICA diameter reduction  . CATARACT EXTRACTION W/ INTRAOCULAR LENS IMPLANT Bilateral 2013  . COLONOSCOPY N/A 05/18/2016   Procedure: COLONOSCOPY;  Surgeon: Manus Gunning, MD;  Location: [facility] ENDOSCOPY;  Service: Gastroenterology;  Laterality: N/A;  . CORONARY ANGIOPLASTY  07/05/2006   3 vessel CAD, patent LIMA to LAD, patentVG to OM, patent SVG to PDA & PLA, mild MR, severe LV systolic dysfunction (Dr. Gerrie Nordmann)  . CORONARY ARTERY BYPASS GRAFT  03/01/2002   LIMA to LAD, reverse SVG to OM, reverse SVG to PDA of RCA, reverse SVG to PLA of RCA, ligation of LA appendage (Dr. Servando Snare)  . ENTEROSCOPY N/A 05/20/2016   Procedure: ENTEROSCOPY;  Surgeon: Manus Gunning, MD;   Location: [place];  Service: Gastroenterology;  Laterality: N/A;  . ESOPHAGOGASTRODUODENOSCOPY  02/01/2012   Procedure: ESOPHAGOGASTRODUODENOSCOPY (EGD);  Surgeon: Beryle Beams, MD;  Location: [facility];  Service: Endoscopy;  Laterality: N/A;  . ESOPHAGOGASTRODUODENOSCOPY N/A 05/22/2015   Procedure: ESOPHAGOGASTRODUODENOSCOPY (EGD);  Surgeon: Gatha Mayer, MD;  Location: [facility]OSCOPY;  Service: Endoscopy;  Laterality: N/A;  . ESOPHAGOGASTRODUODENOSCOPY N/A 05/17/2016   Procedure: ESOPHAGOGASTRODUODENOSCOPY (EGD);  Surgeon: Juanita Craver, MD;  Location: [facility] ENDOSCOPY;  Service: Endoscopy;  Laterality: N/A;  . ESOPHAGOGASTRODUODENOSCOPY N/A 09/17/2017   Procedure: ESOPHAGOGASTRODUODENOSCOPY (EGD);  Surgeon: Milus Banister, MD;  Location: [facility] ENDOSCOPY;  Service: Endoscopy;  Laterality: N/A;  . FOREIGN BODY REMOVAL  09/17/2017   Procedure: FOREIGN BODY REMOVAL;  Surgeon: Milus Banister, MD;  Location: [place];  Service: Endoscopy;;  . GIVENS CAPSULE STUDY N/A 05/18/2016   Procedure: GIVENS CAPSULE STUDY;  Surgeon: Manus Gunning, MD;  Location: [facility] ENDOSCOPY;  Service: Gastroenterology;  Laterality: N/A;  . LAPAROSCOPIC APPENDECTOMY  03/30/2011   Procedure: APPENDECTOMY LAPAROSCOPIC;  Surgeon: Joyice Faster. Cornett, MD;  Location: [place];  Service: General;  Laterality: N/A;  . LEFT HEART CATHETERIZATION WITH CORONARY ANGIOGRAM N/A 01/24/2013   Procedure: LEFT HEART CATHETERIZATION WITH CORONARY ANGIOGRAM;  Surgeon: [name], MD;  Location: [facility] CATH LAB;  Service: Cardiovascular;  Laterality: N/A;  Right heart with grafts  . LITHOTRIPSY     "once" (01/21/2013)  . NM MYOCAR PERF WALL MOTION  05/2009   persantine myoview - fixed moderate perfusion defect in inferior wall & lateral segment of apex (poor non-transmural infarction), minimal anterolateral periinfarct reversible ischemia seen, abnormal study, defects similar to 2006 study  . RIGHT/LEFT HEART CATH AND CORONARY ANGIOGRAPHY N/A  09/17/2017   Procedure: RIGHT/LEFT HEART CATH AND CORONARY ANGIOGRAPHY;  Surgeon: Martinique, Peter M, MD;  Location: [place] CV LAB;  Service: Cardiovascular;  Laterality: N/A;  . SPLENECTOMY, TOTAL  01/2013  . TEE WITHOUT CARDIOVERSION N/A 01/23/2013   Procedure: TRANSESOPHAGEAL ECHOCARDIOGRAM (TEE);  Surgeon: Sanda Klein, MD;  Location: [facility] ENDOSCOPY;  Service: Cardiovascular;  Laterality: N/A;  . TRANSTHORACIC ECHOCARDIOGRAM  06/23/2012   EF 40-45%, mild LVH; mild AV regurg; mild MV regurg; LV mod-severely dilated; RV mildly dilated; systolic pressure borderline increased; RA mod dilated    Social History   Tobacco Use  . Smoking status: Former Smoker    Packs/day: 2.00    Years: 20.00    Pack years: 40.00    Types: Cigarettes    Last attempt to quit: 04/08/1986    Years since quitting: 31.4  . Smokeless tobacco: Current User    Types: Chew  Substance Use Topics  . Alcohol use: No    Alcohol/week: 0.0 oz  . Drug use: No    Family History  Problem Relation Age of Onset  . Heart disease Father        rheumatic fever  . Heart attack Brother 39  . Stroke Mother   . Stroke Brother 6    Allergies  Allergen Reactions  . Percocet [Oxycodone-Acetaminophen] Itching and Nausea Only    Medication list has been reviewed and updated.  Current Outpatient Medications on File Prior to Visit  Medication Sig Dispense Refill  . apixaban (ELIQUIS) 2.5 MG TABS tablet Take 1 tablet (2.5 mg total) by mouth 2 (two) times daily. FOLLOW UP WITH CARDIOLOGY PRIOR TO NEXT REFILL AUTHORIZATION 60 tablet 0  . carvedilol (COREG) 3.125 MG tablet TAKE 1 TABLET(3.125 MG) BY MOUTH TWICE DAILY WITH A MEAL 180 tablet 3  . fluticasone (FLONASE) 50 MCG/ACT nasal spray Place 2 sprays into both nostrils daily.    . furosemide (LASIX) 40 MG tablet Take 1 tablet (40 mg total) by mouth daily. 30 tablet 0  . guaiFENesin (MUCINEX) 600 MG 12 hr tablet Take 600 mg by mouth 2 (two) times daily as needed for cough  or to loosen phlegm.    Marland Kitchen ipratropium (ATROVENT) 0.03 % nasal spray Place 2 sprays into the nose 4 (four) times daily. 30 mL 6  . lisinopril (PRINIVIL,ZESTRIL) 5 MG tablet Take 1 tablet (5 mg total) by mouth daily. 90 tablet 3  . pantoprazole (PROTONIX) 40 MG tablet Take 1 tablet (40 mg total) by mouth 2 (two) times daily before a meal. 60 tablet 0  . rOPINIRole (REQUIP) 0.25 MG tablet TAKE 1 TABLET(0.25 MG) BY MOUTH TWICE DAILY 180 tablet 3  . rosuvastatin (CRESTOR) 40 MG tablet TAKE 1 TABLET(40  MG) BY MOUTH DAILY 90 tablet 3   No current facility-administered medications on file prior to visit.     Review of Systems:  As per HPI- otherwise negative. No fever or chills No longer SOB   Physical Examination: Vitals:   09/29/17 1138  BP: 130/86  Pulse: 71  Resp: 18  SpO2: 97%   Vitals:   09/29/17 1138  Weight: 179 lb (81.2 kg)  Height: 5\' 7"  (1.702 m)   Body mass index is 28.04 kg/m. Ideal Body Weight: Weight in (lb) to have BMI = 25: 159.3  GEN: WDWN, NAD, Non-toxic, A & O x 3, elderly man who looks well  HEENT: Atraumatic, Normocephalic. Neck supple. No masses, No LAD. Ears and Nose: No external deformity. CV: RRR, No M/G/R. No JVD. No thrill. No extra heart sounds. PULM: CTA B, no wheezes, crackles, rhonchi. No retractions. No resp. distress. No accessory muscle use. EXTR: No c/c/e- his ankles look great today  NEURO Normal gait for pt- slow but he does not use a cane or walker  PSYCH: Normally interactive. Conversant. Not depressed or anxious appearing.  Calm demeanor.    Assessment and Plan: Hospital discharge follow-up  Acute congestive heart failure, unspecified heart failure type ([place])  Acute respiratory failure with hypoxia (HCC)  Esophageal obstruction due to food impaction  Following up from hospital admission today He is doing much better and feels back to normal really  He is taking lasix 40 daily and weight is stable, he is monitoring daily He has  GI follow-up scheduled  Mild anemia noted- will need to repeat CBC at next visit, asked him to come back in 2 months  Reinforced daily weights and limiting sodium intake Signed Lamar Blinks, MD

## 2017-09-29 ENCOUNTER — Ambulatory Visit (INDEPENDENT_AMBULATORY_CARE_PROVIDER_SITE_OTHER): Payer: Medicare Other | Admitting: Family Medicine

## 2017-09-29 ENCOUNTER — Encounter: Payer: Self-pay | Admitting: Family Medicine

## 2017-09-29 VITALS — BP 130/86 | HR 71 | Resp 18 | Ht 67.0 in | Wt 179.0 lb

## 2017-09-29 DIAGNOSIS — Z09 Encounter for follow-up examination after completed treatment for conditions other than malignant neoplasm: Secondary | ICD-10-CM | POA: Diagnosis not present

## 2017-09-29 DIAGNOSIS — I509 Heart failure, unspecified: Secondary | ICD-10-CM

## 2017-09-29 DIAGNOSIS — J9601 Acute respiratory failure with hypoxia: Secondary | ICD-10-CM

## 2017-09-29 DIAGNOSIS — K222 Esophageal obstruction: Secondary | ICD-10-CM

## 2017-09-29 DIAGNOSIS — T18128A Food in esophagus causing other injury, initial encounter: Secondary | ICD-10-CM

## 2017-09-29 NOTE — Patient Instructions (Addendum)
It was good to see you today-  I am glad that you are feeling better!  Continue to weigh yourself every day-  If your weight goes up 3 lbs in one day or 5 lbs in a week increase your furosemide to twice a day and call me or your heart doctor I will ask your cardiologist to arrange a follow-up visit for you  Atrovent nasal spray- ipratropium nasal- is your best bet for drying out a runny drippy nose   Please see me in 2 months to check labs and see how you are doing- sooner if you need anything!

## 2017-10-18 ENCOUNTER — Encounter: Payer: Self-pay | Admitting: Cardiovascular Disease

## 2017-10-18 ENCOUNTER — Ambulatory Visit: Payer: Medicare Other | Admitting: Cardiovascular Disease

## 2017-10-18 VITALS — BP 110/60 | HR 57 | Ht 66.5 in | Wt 178.2 lb

## 2017-10-18 DIAGNOSIS — I25810 Atherosclerosis of coronary artery bypass graft(s) without angina pectoris: Secondary | ICD-10-CM

## 2017-10-18 DIAGNOSIS — I255 Ischemic cardiomyopathy: Secondary | ICD-10-CM | POA: Diagnosis not present

## 2017-10-18 DIAGNOSIS — I6523 Occlusion and stenosis of bilateral carotid arteries: Secondary | ICD-10-CM | POA: Diagnosis not present

## 2017-10-18 DIAGNOSIS — I482 Chronic atrial fibrillation, unspecified: Secondary | ICD-10-CM

## 2017-10-18 DIAGNOSIS — I4821 Permanent atrial fibrillation: Secondary | ICD-10-CM

## 2017-10-18 DIAGNOSIS — N289 Disorder of kidney and ureter, unspecified: Secondary | ICD-10-CM

## 2017-10-18 DIAGNOSIS — I10 Essential (primary) hypertension: Secondary | ICD-10-CM | POA: Diagnosis not present

## 2017-10-18 DIAGNOSIS — G2581 Restless legs syndrome: Secondary | ICD-10-CM

## 2017-10-18 DIAGNOSIS — Z7901 Long term (current) use of anticoagulants: Secondary | ICD-10-CM

## 2017-10-18 NOTE — Patient Instructions (Signed)
Medication Instructions:  Your physician recommends that you continue on your current medications as directed. Please refer to the Current Medication list given to you today.  Follow-Up: 2-3 months with Dr. Claiborne Billings  Any Other Special Instructions Will Be Listed Below (If Applicable).     If you need a refill on your cardiac medications before your next appointment, please call your pharmacy.

## 2017-10-18 NOTE — Progress Notes (Signed)
Patient ID: Gregory [name], male   DOB: 1932-03-29, 84 y.o.   MRN: [id number]     HPI: Gregory [name] is a 84 y.o. male returns for an one year followup cardiology evaluation and f/u of recent hospitalization.   Gregory [name] has a history of known CAD in 2003 underwent CABG revascularization surgery by Dr. Servando Snare  (LIMA  to  LAD, vein graft to the obtuse marginal, sequential vein graft to the PDA and PLA of RCA).  His last cardiac catheterization was in 2008 done by Dr. Tami Ribas. At that time, ejection fraction was 20-25%. He declined consideration for ICD. Ejection fraction improved to approximately 35-40% in September 2012 on medical therapy. In the past, he has refused Coumadin therapy and had been maintained on aspirin and Plavix. In 2013 he was hospitalized for appendicitis with abscess requiring laparoscopic appendectomy. He was hospitalized in November 2014 and was felt to have a splenic infarct of embolic etiology. He underwent a transesophageal echocardiogram on 01/23/2013 by Dr. Sallyanne Kuster which revealed an ejection fraction in the range of 15-25%. There was diffuse hypokinesis with severe hypokinesis to akinesis of the anteroseptal apical myocardium. He had increased left ventricular end-diastolic filling pressure and elevated left atrial filling pressure. There was evidence for large solid fixed thrombus occupying the entire atrial appendage with moderate continuous spontaneous echo contrast ("smoke") in the left atrial cavity. There was no evidence for right atrial thrombus. At that time, the patient refused life vest.  He has been on Xarelto for anticoagulation.He denies chest pain. He denies significant shortness of breath. He is unaware of tachdysrhythmias.   He has permanent atrial fibrillation, but his rate has been controlled.  He denies any presyncope or syncope.  He denies any chest pain.  A followup echo Doppler study on 06/14/2013 revealed significant improvement in LV function with an  ejection fraction estimated now at 50-55% without regional wall motion abnormalities.  He did have mild aortic root dilatation, mild aortic insufficiency, and mild mitral regurgitation with biatrial enlargement.   He has severe bilateral carotid artery disease.  He had been seen by Dr. Oneida Alar.  His prior carotid duplex scan was significantly abnormal with his left carotid velocities increased to 387 systolic and 564 diastolic with severe fibrous plaque and elevated velocities within all segments of his left internal carotid.  There also was 50-69% diameter reduction in the right bulb/proximal ICA. I referred him to Dr. Gwenlyn Found who subsequently referred him to Dr. Trula Slade.  He denies chest pain.  He denies palpitations.  He denies shortness of breath.  He continues to say he is asymptomatic with reference to his carotid disease and has refused intervention.  When I saw him  Last yehe denied any change in symptomatology.  A follow-up Doppler study on 08/23/2014 showed increased velocities in the left ar  492 over 148 at the MICA level and to 30/67, at the PICA level.  On the right, his PICA velocity was 292/78.  When compared to his prior study.  This has not increased and has remained fairly stable.  He denies chest pain or palpitations.  He denies paresthesias.  He denies visual symptoms.  When I saw him in follow-up,  he remained asymptomatic.  He has refused to consider any potential carotid intervention or surgery.  He was unaware of any fast heartbeats with his chronic atrial fibrillation.  He denies bleeding.  A carotid study earlier today continued to demonstrate severe stenosis with peak  velocity in the left  MICA at 500/122 and PICA 241/34. On the right,PICA was 293/106.  At his last office visit, I recommended discontinuing of simvastatin in attempt to be more aggressive with lipid lowering and started Crestor 40 mg daily.  He was hospitalized May 2017  after sustaining a renal infarct with acute  kidney injury after taking 3 days off Xarelto for pain management injection to his back.  He had developed mild anemia with a hemoglobin drop of 3 points leading to a subsequent emergency room evaluation.  A CT of his abdomen and pelvis did not show acute findings or signs of peritoneal bleeding.  He denied any episodes of chest pain,  paresthesias, presyncope or syncope. Or bleeding  An echo Doppler study in May 2017 showed an EF of 55-60%.  There was mild aortic insufficiency, mild aortic root dilatation, mitral annular calcification with mild MR.   When I later saw him in the office, had begun to consider the possibility of maybe in the future thinking about carotid surgery, but he was not ready yet to have this done.  His last carotid study in September 2017 showed left carotid peak systolic velocity of 161 and peak diastolic velocity 096, consistent with a least 80% stenosis.  On the right peak systolic velocity was 045 and diastolic velocity was 85.  He is followed by Dr. Posey Pronto for his chronic kidney disease.  When I last saw him in July 2018 he was feeling well and denied any episodes of chest pain or shortness of breath.  He denied any neurologic symptoms.  He denied paresthesias or dizziness.  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 At that time he was still against consideration for carotid surgery.  He was on aggressive lipid-lowering therapy with target LDL less than 70 and potential induce plaque regression.  Since I last saw him, he was seen by Mammie Russian in June 2019.  He was on Eliquis without bleeding he was hospitalized from July 2 through July 6 with acute decompensated heart failure complicated by acute hypoxic respiratory failure.  An echo Doppler study showed an EF of 25 to 30% with diffuse hypokinesis which had significantly decreased from 50 to 60% in 2017.  He underwent repeat catheterization which showed occlusion of his native vessels with a patent LIMA to the  LAD, a patent vein graft was on 2, and subchondral vein graft to the PDA/PLA.  He was seen by Charise Carwin in follow-up on September 29, 2017.  At that time he was feeling well and denied significant shortness of breath.  Presently he is on lisinopril 5 mg, furosemide 40 mg daily in addition to carvedilol 3.125 mg twice a day.  He is anticoagulated on Eliquis 2.5 mg twice a day.  He is on rosuvastatin 40 mg.  For restless legs he also has been on Requip.  He presents for reevaluation.   Past Medical History:  Diagnosis Date  . Acute respiratory failure ([place])   . Appendicitis with abscess 03/31/2011  . Arthritis   . CAD (coronary artery disease),CABG 1993-LIMA to the LAD, SVG to Om and SVG to PDA/PLA, patent on cath 2008. 2003   a. s/p CABG 2003. b. last cath 2008 with patent grafts.  . Chronic atrial fibrillation ([facility])    permanent  . Chronic systolic CHF (congestive heart failure) ([place])    a. Prior low EF, later normalized.  . Essential hypertension   . GERD (gastroesophageal reflux disease)   . Heart murmur   .  Hyperlipidemia   . Ischemic cardiomyopathy   . Kidney stones    "passed all but one time when he had to have lithotripsy" (01/21/2013)  . Pulmonary hypertension ([place])   . 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 07/2015 - after holding Xarelto x 3 days for spinal procedure.  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 SSS (sick sinus syndrome) ([place])   . Stroke [facility]) 2007   "slightly drags left foot since; recovered qthing else" (01/21/2013)  . Syncope 07/2015   a. felt vasovagal in setting of pain from renal infarct.    Past Surgical History:  Procedure Laterality Date  . BALLOON DILATION N/A 05/22/2015   Procedure: BALLOON DILATION;  Surgeon: Gatha Mayer, MD;  Location: [facility] ENDOSCOPY;  Service: Endoscopy;  Laterality: N/A;  . CARDIAC CATHETERIZATION  02/24/2002   reduced LV function, 60-70% prox RCA stenosis, 70% PLA ostial stenosis, 80% secondary branch of PLA stenosis - subsequent CABG (Dr. Jackie Plum)    . Carotid Doppler  06/2012   50-69% right bulb/prox ICA diameter reduction; 70-99% left bulb/prox ICA diameter reduction  . CATARACT EXTRACTION W/ INTRAOCULAR LENS IMPLANT Bilateral 2013  . COLONOSCOPY N/A 05/18/2016   Procedure: COLONOSCOPY;  Surgeon: Manus Gunning, MD;  Location: [facility] ENDOSCOPY;  Service: Gastroenterology;  Laterality: N/A;  . CORONARY ANGIOPLASTY  07/05/2006   3 vessel CAD, patent LIMA to LAD, patentVG to OM, patent SVG to PDA & PLA, mild MR, severe LV systolic dysfunction (Dr. Gerrie Nordmann)  . CORONARY ARTERY BYPASS GRAFT  03/01/2002   LIMA to LAD, reverse SVG to OM, reverse SVG to PDA of RCA, reverse SVG to PLA of RCA, ligation of LA appendage (Dr. Servando Snare)  . ENTEROSCOPY N/A 05/20/2016   Procedure: ENTEROSCOPY;  Surgeon: Manus Gunning, MD;  Location: [place];  Service: Gastroenterology;  Laterality: N/A;  . ESOPHAGOGASTRODUODENOSCOPY  02/01/2012   Procedure: ESOPHAGOGASTRODUODENOSCOPY (EGD);  Surgeon: Beryle Beams, MD;  Location: [facility];  Service: Endoscopy;  Laterality: N/A;  . ESOPHAGOGASTRODUODENOSCOPY N/A 05/22/2015   Procedure: ESOPHAGOGASTRODUODENOSCOPY (EGD);  Surgeon: Gatha Mayer, MD;  Location: [facility]OS[facility];  Service: Endoscopy;  Laterality: N/A;  . ESOPHAGOGASTRODUODENOSCOPY N/A 05/17/2016   Procedure: ESOPHAGOGASTRODUODENOSCOPY (EGD);  Surgeon: Juanita Craver, MD;  Location[facility] ENDOSCOPY;  Service: Endoscopy;  Laterality: N/A;  . ESOPHAGOGASTRODUODENOSCOPY N/A 09/17/2017   Procedure: ESOPHAGOGASTRODUODENOSCOPY (EGD);  Surgeon: Milus Banister, MD;  Location: [facility] ENDOSCOPY;  Service: Endoscopy;  Laterality: N/A;  . FOREIGN BODY REMOVAL  09/17/2017   Procedure: FOREIGN BODY REMOVAL;  Surgeon: Milus Banister, MD;  Location: [place];  Service: Endoscopy;;  . GIVENS CAPSULE STUDY N/A 05/18/2016   Procedure: GIVENS CAPSULE STUDY;  Surgeon: Manus Gunning, MD;  Location: [place];  Service: Gastroenterology;  Laterality: N/A;  .  LAPAROSCOPIC APPENDECTOMY  03/30/2011   Procedure: APPENDECTOMY LAPAROSCOPIC;  Surgeon: Joyice Faster. Cornett, MD;  Location: [place];  Service: General;  Laterality: N/A;  . LEFT HEART CATHETERIZATION WITH CORONARY ANGIOGRAM N/A 01/24/2013   Procedure: LEFT HEART CATHETERIZATION WITH CORONARY ANGIOGRAM;  Surgeon: [name], MD;  Location: [facility] CATH LAB;  Service: Cardiovascular;  Laterality: N/A;  Right heart with grafts  . LITHOTRIPSY     "once" (01/21/2013)  . NM MYOCAR PERF WALL MOTION  05/2009   persantine myoview - fixed moderate perfusion defect in inferior wall & lateral segment of apex (poor non-transmural infarction), minimal anterolateral periinfarct reversible ischemia seen, abnormal study, defects similar to 2006 study  . RIGHT/LEFT HEART CATH AND CORONARY  ANGIOGRAPHY N/A 09/17/2017   Procedure: RIGHT/LEFT HEART CATH AND CORONARY ANGIOGRAPHY;  Surgeon: Martinique, Peter M, MD;  Location: [place] CV LAB;  Service: Cardiovascular;  Laterality: N/A;  . SPLENECTOMY, TOTAL  01/2013  . TEE WITHOUT CARDIOVERSION N/A 01/23/2013   Procedure: TRANSESOPHAGEAL ECHOCARDIOGRAM (TEE);  Surgeon: Sanda Klein, MD;  Location: [facility] ENDOSCOPY;  Service: Cardiovascular;  Laterality: N/A;  . TRANSTHORACIC ECHOCARDIOGRAM  06/23/2012   EF 40-45%, mild LVH; mild AV regurg; mild MV regurg; LV mod-severely dilated; RV mildly dilated; systolic pressure borderline increased; RA mod dilated    Allergies  Allergen Reactions  . Percocet [Oxycodone-Acetaminophen] Itching and Nausea Only    Current Outpatient Medications  Medication Sig Dispense Refill  . apixaban (ELIQUIS) 2.5 MG TABS tablet Take 1 tablet (2.5 mg total) by mouth 2 (two) times daily. FOLLOW UP WITH CARDIOLOGY PRIOR TO NEXT REFILL AUTHORIZATION 60 tablet 0  . carvedilol (COREG) 3.125 MG tablet TAKE 1 TABLET(3.125 MG) BY MOUTH TWICE DAILY WITH A MEAL 180 tablet 3  . fluticasone (FLONASE) 50 MCG/ACT nasal spray Place 2 sprays into both nostrils daily.      . furosemide (LASIX) 40 MG tablet Take 1 tablet (40 mg total) by mouth daily. 30 tablet 0  . guaiFENesin (MUCINEX) 600 MG 12 hr tablet Take 600 mg by mouth 2 (two) times daily as needed for cough or to loosen phlegm.    Marland Kitchen ipratropium (ATROVENT) 0.03 % nasal spray Place 2 sprays into the nose 4 (four) times daily. (Patient taking differently: Place 2 sprays into the nose as needed. ) 30 mL 6  . lisinopril (PRINIVIL,ZESTRIL) 5 MG tablet Take 1 tablet (5 mg total) by mouth daily. 90 tablet 3  . pantoprazole (PROTONIX) 40 MG tablet Take 1 tablet (40 mg total) by mouth 2 (two) times daily before a meal. 60 tablet 0  . rOPINIRole (REQUIP) 0.25 MG tablet TAKE 1 TABLET(0.25 MG) BY MOUTH TWICE DAILY 180 tablet 3  . rosuvastatin (CRESTOR) 40 MG tablet TAKE 1 TABLET(40 MG) BY MOUTH DAILY 90 tablet 3   No current facility-administered medications for this visit.     Social History   Socioeconomic History  . Marital status: Married    Spouse name: Not on file  . Number of children: 9  . Years of education: 8  . Highest education level: Not on file  Occupational History  . Occupation: retired  Scientific laboratory technician  . Financial resource strain: Not on file  . Food insecurity:    Worry: Not on file    Inability: Not on file  . Transportation needs:    Medical: Not on file    Non-medical: Not on file  Tobacco Use  . Smoking status: Former Smoker    Packs/day: 2.00    Years: 20.00    Pack years: 40.00    Types: Cigarettes    Last attempt to quit: 04/08/1986    Years since quitting: 31.5  . Smokeless tobacco: Current User    Types: Chew  Substance and Sexual Activity  . Alcohol use: No    Alcohol/week: 0.0 standard drinks  . Drug use: No  . Sexual activity: Not on file  Lifestyle  . Physical activity:    Days per week: Not on file    Minutes per session: Not on file  . Stress: Not on file  Relationships  . Social connections:    Talks on phone: Not on file    Gets together: Not on file  Attends religious service: Not on file    Active member of club or organization: Not on file    Attends meetings of clubs or organizations: Not on file    Relationship status: Not on file  . Intimate partner violence:    Fear of current or ex partner: Not on file    Emotionally abused: Not on file    Physically abused: Not on file    Forced sexual activity: Not on file  Other Topics Concern  . Not on file  Social History Narrative   A she is married, 5 sons 4 daughters. He is retired Horticulturist, commercial. 2-3 caffeinated beverages a day no alcohol    Family History  Problem Relation Age of Onset  . Heart disease Father        rheumatic fever  . Heart attack Brother 66  . Stroke Mother   . Stroke Brother 68   ROS General: Negative; No fevers, chills, or night sweats;  HEENT: Negative; No changes in vision or hearing, sinus congestion, difficulty swallowing Pulmonary: Negative; No cough, wheezing, shortness of breath, hemoptysis Cardiovascular: See history of present illness GI: positive for GERD; No nausea, vomiting, diarrhea, or abdominal pain GU: Renal infarct Musculoskeletal: Negative; no myalgias, joint pain, or weakness Hematologic/Oncology: Negative; no easy bruising, bleeding Endocrine: Negative; no heat/cold intolerance; no diabetes Neuro: positive for remote CVA Skin: Negative; No rashes or skin lesions Psychiatric: Negative; No behavioral problems, depression Sleep: Positive for restless legs; no snoring, daytime sleepiness, hypersomnolence, bruxism, , hypnogognic hallucinations, no cataplexy Other comprehensive 14 point system review is negative.   PE BP 110/60 (BP Location: Left Arm, Patient Position: Sitting)   Pulse (!) 57   Ht 5' 6.5" (1.689 m)   Wt 178 lb 3.2 oz (80.8 kg)   BMI 28.33 kg/m    Repeat blood pressure by me was 114/64.  Wt Readings from Last 3 Encounters:  10/21/17 180 lb (81.6 kg)  10/18/17 178 lb 3.2 oz (80.8 kg)  09/29/17 179 lb (81.2 kg)    General: Alert, oriented, no distress.  Skin: normal turgor, no rashes, warm and dry HEENT: Normocephalic, atraumatic. Pupils equal round and reactive to light; sclera anicteric; extraocular muscles intact;  Nose without nasal septal hypertrophy Mouth/Parynx benign; Mallinpatti scale 3 Neck: No JVD, bilateral carotid bruits left greater than right. Lungs: clear to ausculatation and percussion; no wheezing or rales Chest wall: without tenderness to palpitation Heart: PMI not displaced, irregularly irregular consistent with his atrial fibrillation with controlled ventricular rate., s1 s2 normal, 1/6 systolic murmur, no diastolic murmur, no rubs, gallops, thrills, or heaves Abdomen: soft, nontender; no hepatosplenomehaly, BS+; abdominal aorta nontender and not dilated by palpation. Back: no CVA tenderness Pulses 2+ Musculoskeletal: full range of motion, normal strength, no joint deformities Extremities: no clubbing cyanosis or edema, Homan's sign negative  Neurologic: grossly nonfocal; Cranial nerves grossly wnl Psychologic: Normal mood and affect   ECG (independently read by me): AF vs junctional at 63; old anterolateral MI; QTc 497 msec  July 2018 ECG (independently read by me): Atrial fibrillation at 60 bpm.  Low voltage.  Probable old anterolateral MI with poor anterior R-wave progression.  Small inferior Q waves.  December 2017 ECG (independently read by me): Atrial fibrillation with a rate at 58 bpm.  QS complex anteriorly.  Low voltage.  September 2017 ECG (independently read by me): Atrial fibrillation with premature ventricular beats.  Poor R wave progression anteriorly.  Small nondiagnostic inferior Q waves.  December 2016 ECG (  independently read by me): atrial fibrillation with rate in the 60s with occasional PVC.  QTc interval 452 ms  August 2016 ECG (independently read by me): Atrial fibrillation at 67 bpm.  2 ventricular premature beats.  Poor R-wave progression  May 2016  ECG (independently read by me): Atrial fibrillation with occasional PVCs.  Heart rate 72.  ECG (independently read by me): Atrial fibrillation with controlled ventricular response in the 70s.  Poor anterior R-wave progression.  Prior April 2015 ECG (independently read by me): Atrial fibrillation at 56 beats per minute.  QTc interval 395 ms  Prior 02/23/2013 ECG: Atrial fibrillation at 80 beats per minute with isolated PVC.  LABS: BMP Latest Ref Rng & Units 09/18/2017 09/17/2017 09/16/2017  Glucose 70 - 99 mg/dL 122(H) 94 101(H)  BUN 8 - 23 mg/dL 20 24(H) 25(H)  Creatinine 0.61 - 1.24 mg/dL 1.29(H) 1.50(H) 1.62(H)  BUN/Creat Ratio 10 - 24 - - -  Sodium 135 - 145 mmol/L 139 141 142  Potassium 3.5 - 5.1 mmol/L 4.2 3.4(L) 3.7  Chloride 98 - 111 mmol/L 107 105 104  CO2 22 - 32 mmol/L '26 29 31  '$ Calcium 8.9 - 10.3 mg/dL 8.5(L) 8.3(L) 8.3(L)   Hepatic Function Latest Ref Rng & Units 09/14/2017 08/25/2017 08/06/2017  Total Protein 6.5 - 8.1 g/dL 7.1 6.5 6.1  Albumin 3.5 - 5.0 g/dL 3.3(L) 3.8 -  AST 15 - 41 U/L 26 35 22  ALT 0 - 44 U/L '15 28 13  '$ Alk Phosphatase 38 - 126 U/L 83 107 -  Total Bilirubin 0.3 - 1.2 mg/dL 1.2 0.4 0.7  Bilirubin, Direct 0.0 - 0.3 mg/dL - - -   CBC Latest Ref Rng & Units 09/18/2017 09/17/2017 09/15/2017  WBC 4.0 - 10.5 K/uL 7.1 6.9 6.6  Hemoglobin 13.0 - 17.0 g/dL 10.5(L) 9.7(L) 9.2(L)  Hematocrit 39.0 - 52.0 % 33.0(L) 30.2(L) 28.9(L)  Platelets 150 - 400 K/uL 196 190 178   Lab Results  Component Value Date   MCV 99.7 09/18/2017   MCV 98.4 09/17/2017   MCV 98.6 09/15/2017   Lab Results  Component Value Date   TSH 4.970 (H) 09/28/2016   Lab Results  Component Value Date   HGBA1C 6.5 04/22/2016   Lipid Panel     Component Value Date/Time   CHOL 131 08/25/2017 1137   TRIG 105 08/25/2017 1137   HDL 36 (L) 08/25/2017 1137   CHOLHDL 3.6 08/25/2017 1137   CHOLHDL 2.8 11/27/2015 0830   VLDL 19 11/27/2015 0830   LDLCALC 74 08/25/2017 1137     IMPRESSION:  1.  Permanent atrial fibrillation ([place])   2. Coronary artery disease involving coronary bypass graft of native heart without angina pectoris   3. Ischemic cardiomyopathy   4. Essential hypertension   5. Chronic a-fib ([place])   6. Bilateral carotid artery stenosis   7. RLS (restless legs syndrome)   8. Renal insufficiency   9. Anticoagulation adequate     ASSESSMENT AND PLAN: Gregory [name] is a 84 year old gentleman who is status post CABG revascularization surgery in 2003. He has a history of an  ischemic cardiomyopathy with an initial ejection fraction of 25% with subsequent normalization at 55-60%, however on his most recent echo Doppler study during his July 2019 hospitalization EF was again 25 to 30%.  There was mild AR, mild to moderate MR, biatrial enlargement and mild TR.  He had akinesis of the distal anteroseptal wall and apex.  He has a remote history of a  small right brain CVA in 2005  with subsequent recovery. 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  In the past he was documented to have prior thrombus in his left atrium and had been on Xarelto.  Most recently he is now on Eliquis for anticoagulation.  He has high-grade carotid disease and continues to be followed by Dr. Juanda Crumble feels.  He is scheduled for follow-up carotid duplex imaging in November with plans for office visit at that time.  Presently his blood pressure is controlled.  He is not having any bleeding on Eliquis.  I reviewed his recent hospitalization.  He is on rosuvastatin for hyperlipidemia and most recent LDL was 74.  If this continues to increase addition of Zetia will be recommended.  He has renal insufficiency.  However most recent laboratory showed some improvement in renal function.  If renal function stabilizes, in the future I would recommend transition from ACE inhibition to [facility].  I will see him in 2 to 3 months for reevaluation or sooner if  problems arise.  Time spent: 25 minutes  Troy Sine, MD, [facility]  10/23/2017 7:55 PM

## 2017-10-21 ENCOUNTER — Ambulatory Visit: Payer: Medicare Other | Admitting: Physician Assistant

## 2017-10-21 ENCOUNTER — Encounter: Payer: Self-pay | Admitting: Physician Assistant

## 2017-10-21 ENCOUNTER — Telehealth: Payer: Self-pay

## 2017-10-21 VITALS — BP 88/46 | HR 64 | Ht 60.5 in | Wt 180.0 lb

## 2017-10-21 DIAGNOSIS — T18128D Food in esophagus causing other injury, subsequent encounter: Secondary | ICD-10-CM

## 2017-10-21 DIAGNOSIS — K219 Gastro-esophageal reflux disease without esophagitis: Secondary | ICD-10-CM | POA: Diagnosis not present

## 2017-10-21 DIAGNOSIS — K222 Esophageal obstruction: Secondary | ICD-10-CM | POA: Diagnosis not present

## 2017-10-21 NOTE — Progress Notes (Addendum)
Subjective:    Patient ID: Gregory [name], male    DOB: [birth year], 84 y.o.   MRN: [id number]  HPI Gregory [name] is a pleasant 84 year old white male known to Dr. Carlean Purl.  He comes in today for post hospital follow-up.  He was admitted in July 2019 with acute congestive heart failure, and had an episode of solid food impaction during that admission.  He underwent EGD with Dr. Ardis Hughs on 09/17/2017 had a large meat bolus removed from his esophagus.  He was noted to have a focal GE junction stricture which was inflamed. She had undergone EGD in March 2018 per Dr. Mar Daring man for anemia, that exam was felt to be normal however in 2017 he had undergone EGD with dilation of stricture from 15 to 18 mm per Dr. Carlean Purl. Patient has multiple significant medical issues, he is maintained on chronic Eliquis.  He has atrial fibrillation, coronary artery disease status post CABG, he had small CVA in 2005, chronic kidney disease stage III, carotid stenosis, and has a cardiomyopathy.  He did have recent echo done on 09/14/2017 while hospitalized and EF was 25 to 30%. Patient says he has not had any problems with dysphasia since the episode in the hospital.  He says he has been eating and chewing more carefully.  He is maintained on Protonix 40 mg p.o. twice daily, has no complaint of heartburn or indigestion.  He admits to having some mild episodes of dysphasia since discharge from the hospital but if he stops eating 4-minute the food will go on down.  He has not had any episodes requiring regurgitation. Patient states that he does not want to have an endoscopy or esophageal dilation at this time.  He is still recuperating from the episode of acute congestive heart failure.  He also says he has not had any significant problems with dysphasia since he had the esophageal dilation in 2017 until this recent episode in July 2019.  Review of Systems Pertinent positive and negative review of systems were noted in the above HPI section.   All other review of systems was otherwise negative.  Outpatient Encounter Medications as of 10/21/2017  Medication Sig  . apixaban (ELIQUIS) 2.5 MG TABS tablet Take 1 tablet (2.5 mg total) by mouth 2 (two) times daily. FOLLOW UP WITH CARDIOLOGY PRIOR TO NEXT REFILL AUTHORIZATION  . carvedilol (COREG) 3.125 MG tablet TAKE 1 TABLET(3.125 MG) BY MOUTH TWICE DAILY WITH A MEAL  . fluticasone (FLONASE) 50 MCG/ACT nasal spray Place 2 sprays into both nostrils daily.  Marland Kitchen guaiFENesin (MUCINEX) 600 MG 12 hr tablet Take 600 mg by mouth 2 (two) times daily as needed for cough or to loosen phlegm.  Marland Kitchen ipratropium (ATROVENT) 0.03 % nasal spray Place 2 sprays into the nose 4 (four) times daily. (Patient taking differently: Place 2 sprays into the nose as needed. )  . lisinopril (PRINIVIL,ZESTRIL) 5 MG tablet Take 1 tablet (5 mg total) by mouth daily.  Marland Kitchen rOPINIRole (REQUIP) 0.25 MG tablet TAKE 1 TABLET(0.25 MG) BY MOUTH TWICE DAILY  . rosuvastatin (CRESTOR) 40 MG tablet TAKE 1 TABLET(40 MG) BY MOUTH DAILY  . furosemide (LASIX) 40 MG tablet Take 1 tablet (40 mg total) by mouth daily.  . pantoprazole (PROTONIX) 40 MG tablet Take 1 tablet (40 mg total) by mouth 2 (two) times daily before a meal.   No facility-administered encounter medications on file as of 10/21/2017.    Allergies  Allergen Reactions  . Percocet [Oxycodone-Acetaminophen] Itching and Nausea Only  Patient Active Problem List   Diagnosis Date Noted  . Esophageal obstruction due to food impaction   . Acute congestive heart failure ([place])   . Acute respiratory failure ([place]) 09/14/2017  . Acute on chronic systolic heart failure ([place])   . Fatigue 08/06/2017  . Symptomatic anemia   . Chronic anemia 01/13/2016  . Chronic renal failure, stage 3 (moderate) ([place]) 08/06/2015  . Vasovagal syncope   . Coronary artery disease involving native coronary artery   . Renal infarction ([place]) 07/31/2015  . Syncope 07/30/2015  . Esophageal stricture   . Carotid  stenosis 07/13/2013  . Anticoagulated on Eliquis 03/21/2013  . PVD - 16% LICA, 10% RICA by doppler 5/14 01/24/2013  . 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 CAD (coronary artery disease),CABG 1993-LIMA to the LAD, SVG to Om and SVG to PDA/PLA, patent on cath 2014. 03/31/2011  . Chronic a-fib ([place]) 03/31/2011  . Cardiomyopathy, ischemic, improved 03/31/2011  . CVA (cerebral vascular accident) ([place]) 03/31/2011  . GERD (gastroesophageal reflux disease) 03/31/2011  . Renal calculi 03/31/2011  . History of tobacco use, continues with chewing tobacco 03/31/2011   Social History   Socioeconomic History  . Marital status: Married    Spouse name: Not on file  . Number of children: 9  . Years of education: 8  . Highest education level: Not on file  Occupational History  . Occupation: retired  Scientific laboratory technician  . Financial resource strain: Not on file  . Food insecurity:    Worry: Not on file    Inability: Not on file  . Transportation needs:    Medical: Not on file    Non-medical: Not on file  Tobacco Use  . Smoking status: Former Smoker    Packs/day: 2.00    Years: 20.00    Pack years: 40.00    Types: Cigarettes    Last attempt to quit: 04/08/1986    Years since quitting: 31.5  . Smokeless tobacco: Current User    Types: Chew  Substance and Sexual Activity  . Alcohol use: No    Alcohol/week: 0.0 standard drinks  . Drug use: No  . Sexual activity: Not on file  Lifestyle  . Physical activity:    Days per week: Not on file    Minutes per session: Not on file  . Stress: Not on file  Relationships  . Social connections:    Talks on phone: Not on file    Gets together: Not on file    Attends religious service: Not on file    Active member of club or organization: Not on file    Attends meetings of clubs or organizations: Not on file    Relationship status: Not on file  . Intimate partner violence:    Fear of current or ex  partner: Not on file    Emotionally abused: Not on file    Physically abused: Not on file    Forced sexual activity: Not on file  Other Topics Concern  . Not on file  Social History Narrative   A she is married, 5 sons 4 daughters. He is retired Horticulturist, commercial. 2-3 caffeinated beverages a day no alcohol    Mr. Sommers's family history includes Heart attack (age of onset: 68) in his brother; Heart disease in his father; Stroke in his mother; Stroke (age of onset: 12) in his brother.      Objective:    Vitals:  10/21/17 1100  BP: (!) 88/46  Pulse: 64    Physical Exam; well-developed elderly white male no acute distress, accompanied by his wife.  Both pleasant.  Patient has significant cervical kyphosis.  Blood pressure 88/46 pulse 64 height 5 foot, weight 180, BMI 34.5.  HEENT nontraumatic normocephalic EOMI PERRLA sclera anicteric buccal mucosa moist, Cardiovascular irregular rate and rhythm with S1-S2, Pulmonary clear bilaterally, Abdomen soft nontender nondistended bowel sounds are active there is no palpable mass or hepatosplenomegaly.  Rectal exam not done, Extremities no clubbing cyanosis or edema skin warm dry, cap neuro psych alert and oriented, grossly nonfocal mood and affect appropriate       Assessment & Plan:   #63 84 year old white male with history of distal esophageal stricture requiring prior esophageal dilation 2017 who had a recent food impaction while hospitalized with congestive heart failure in July 2019. He underwent EGD with removal of the food impaction and was noted to have a focal GE junction stricture which was inflamed.  He denies any significant problems with dysphasia since.  #2 history of congestive heart failure #3.  Cardiomyopathy with most recent EF 25 to 30% #4.  Chronic kidney disease stage III #5.  Atrial fibrillation #6.  Chronic anticoagulation-on Eliquis-patient's wife said he had problems with clotting when he was taken off of Eliquis once in  the past.  He does have history of an atrial thrombus. #7.  Coronary artery disease status post CABG #8.  History of CVA 2005  Plan; Continue Protonix 40 mg p.o. twice daily Patient is cautioned to be very careful with his diet, avoid tougher meats, and cut all foods into very small pieces, chew carefully and slowly and follow every few sips with liquids. He is currently declining EGD with dilation He  will follow-up on an as-needed basis.  Gregory Cregg S Presley Summerlin PA-C 10/21/2017   Cc: [name], [name], MD  Agree with Ms. Genia Harold assessment and plan. Gatha Mayer, MD, [facility]

## 2017-10-21 NOTE — Telephone Encounter (Signed)
Copied from [place] [id number]. Topic: Medicare AWV >> Oct 21, 2017  1:27 PM Vernona Rieger wrote: Reason for CRM: Patient's daughter [name] ) and said that he has an appt with Dr Lorelei Pont on 9/8 at 3pm and wants to know could he do his medicare wellness on the same day?

## 2017-10-21 NOTE — Patient Instructions (Signed)
Continue Protonix ( Pantoprazole ) 40 mg twice daily.  Follow up with Dr. Carlean Purl as needed.  Cut food into very small pieces , sips of fluids between bites.   If you are age 82 or older, your body mass index should be between 23-30. Your Body mass index is 34.58 kg/m. If this is out of the aforementioned range listed, please consider follow up with your Primary Care Provider.

## 2017-10-21 NOTE — Telephone Encounter (Signed)
Is this something I should ask angel about?

## 2017-10-21 NOTE — Telephone Encounter (Signed)
Yes- will forward to [facility]

## 2017-10-22 NOTE — Telephone Encounter (Signed)
Called pt and LVM regarding the medicare wellness visit. Advised pt to call back and reschedule his visit with Dr. Lorelei Pont so that he can have both visits on the same day.

## 2017-10-23 ENCOUNTER — Encounter: Payer: Self-pay | Admitting: Cardiovascular Disease

## 2017-11-01 NOTE — Telephone Encounter (Signed)
° °  Pt is aware that he can;t have his AWV the same day as his appt with Dr Lorelei Pont. Hewould like a call back about another day

## 2017-11-13 ENCOUNTER — Other Ambulatory Visit: Payer: Self-pay | Admitting: Cardiovascular Disease

## 2017-11-30 NOTE — Progress Notes (Addendum)
[place] [facility] [address] [place], [place] [address] [phone]  Date:  12/01/2017   Name:  Gregory [name]   DOB:  1932-07-29   MRN:  [id number]  PCP:  Darreld Mclean, MD    Chief Complaint: Congestive Heart Failure (6 month follow up) and Immunizations (flu shot)   History of Present Illness:  Gregory [name] is a 84 y.o. very pleasant male patient who presents with the following:  Routine follow-up visit today.  I last saw him in July after he was admitted with acute exacerbation of CHF  Per most recent cardiology note, Dr. Claiborne Billings from last month:  1. Permanent atrial fibrillation ([place])   2. Coronary artery disease involving coronary bypass graft of native heart without angina pectoris   3. Ischemic cardiomyopathy   4. Essential hypertension   5. Chronic a-fib ([place])   6. Bilateral carotid artery stenosis   7. RLS (restless legs syndrome)   8. Renal insufficiency   9. Anticoagulation adequate     ASSESSMENT AND PLAN: Gregory [name] is a 84 year old gentleman who is status post CABG revascularization surgery in 2003. He has a history of an  ischemic cardiomyopathy with an initial ejection fraction of 25% with subsequent normalization at 55-60%, however on his most recent echo Doppler study during his July 2019 hospitalization EF was again 25 to 30%.  There was mild AR, mild to moderate MR, biatrial enlargement and mild TR.  He had akinesis of the distal anteroseptal wall and apex.  He has a remote history of a small right brain CVA in 2005  with subsequent recovery. 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  In the past he was documented to have prior thrombus in his left atrium and had been on Xarelto.  Most recently he is now on Eliquis for anticoagulation.  He has high-grade carotid disease and continues to be followed by Dr. Juanda Crumble feels.   He is scheduled for follow-up carotid duplex imaging in November with plans for office visit at that time.  Presently his blood pressure is controlled.  He is not having any bleeding on Eliquis.  I reviewed his recent hospitalization.  He is on rosuvastatin for hyperlipidemia and most recent LDL was 74.  If this continues to increase addition of Zetia will be recommended.  He has renal insufficiency.  However most recent laboratory showed some improvement in renal function.  If renal function stabilizes, in the future I would recommend transition from ACE inhibition to [facility].  I will see him in 2 to 3 months for reevaluation or sooner if problems arise.  He also saw GI last month, Gregory [name]: #22 84 year old white male with history of distal esophageal stricture requiring prior esophageal dilation 2017 who had a recent food impaction while hospitalized with congestive heart failure in July 2019. He underwent EGD with removal of the food impaction and was noted to have a focal GE junction stricture which was inflamed. He denies any significant problems with dysphasia since. #2 history of congestive heart failure #3.  Cardiomyopathy with most recent EF 25 to 30% #4.  Chronic kidney disease stage III #5.  Atrial fibrillation #6.  Chronic anticoagulation-on Eliquis-patient's wife said he had problems with clotting when he was taken off of Eliquis once in the past.  He does have history of an atrial thrombus. #7.  Coronary  artery disease status post CABG #8.  History of CVA 2005 Plan; Continue Protonix 40 mg p.o. twice daily Patient is cautioned to be very careful with his diet, avoid tougher meats, and cut all foods into very small pieces, chew carefully and slowly and follow every few sips with liquids. He is currently declining EGD with dilation He  will follow-up on an as-needed basis.  Flu shot due Pt states that his swallowing is normal again, but his daughter feels that he is not  swallowing that well. However at this time Gregory [name] is not interested in seeing GI for any intervention They do not have a GI appt- they will call and set this up when needed  BP Readings from Last 3 Encounters:  12/01/17 124/62  10/21/17 (!) 88/46  10/18/17 110/60   Wt Readings from Last 3 Encounters:  12/01/17 180 lb (81.6 kg)  10/21/17 180 lb (81.6 kg)  10/18/17 178 lb 3.2 oz (80.8 kg)   No SOB or CP  He feels like his energy level is good "except for being old" He is still able to do work outdoors, etc but needs more frequent breaks than when he was younger  Flu shot due - will give to him today  He is due for a tetanus shot today as well He has recently gotten a small cut on the dorsum of his right hand and needs a booster He is not having LE edema   He is not using tramadol right now for his back - he did not really feel like it helped him   His daughter Gregory [name] is with him today and contributes to the history  Patient Active Problem List   Diagnosis Date Noted  . Esophageal obstruction due to food impaction   . Acute congestive heart failure ([place])   . Acute respiratory failure ([place]) 09/14/2017  . Acute on chronic systolic heart failure ([place])   . Fatigue 08/06/2017  . Symptomatic anemia   . Chronic anemia 01/13/2016  . Chronic renal failure, stage 3 (moderate) ([place]) 08/06/2015  . Vasovagal syncope   . Coronary artery disease involving native coronary artery   . 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 Syncope 07/30/2015  . Esophageal stricture   . Carotid stenosis 07/13/2013  . Anticoagulated on Eliquis 03/21/2013  . PVD - 18% LICA, 29% RICA by doppler 5/14 01/24/2013  . 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 CAD (coronary artery disease),CABG 1993-LIMA to the LAD, SVG to Om and SVG to PDA/PLA, patent on cath 2014. 03/31/2011  . Chronic a-fib ([place]) 03/31/2011  . Cardiomyopathy, ischemic, improved 03/31/2011  . CVA  (cerebral vascular accident) ([place]) 03/31/2011  . GERD (gastroesophageal reflux disease) 03/31/2011  . Renal calculi 03/31/2011  . History of tobacco use, continues with chewing tobacco 03/31/2011    Past Medical History:  Diagnosis Date  . Acute respiratory failure ([place])   . Appendicitis with abscess 03/31/2011  . Arthritis   . CAD (coronary artery disease),CABG 1993-LIMA to the LAD, SVG to Om and SVG to PDA/PLA, patent on cath 2008. 2003   a. s/p CABG 2003. b. last cath 2008 with patent grafts.  . Chronic atrial fibrillation (HCC)    permanent  . Chronic systolic CHF (congestive heart failure) ([place])    a. Prior low EF, later normalized.  . Essential hypertension   . GERD (gastroesophageal reflux disease)   . Heart murmur   . Hyperlipidemia   . Ischemic  cardiomyopathy   . Kidney stones    "passed all but one time when he had to have lithotripsy" (01/21/2013)  . Pulmonary hypertension ([place])   . Renal infarct ([place])    a. 07/2015 - after holding Xarelto x 3 days for spinal procedure.  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 SSS (sick sinus syndrome) ([place])   . Stroke [facility]) 2007   "slightly drags left foot since; recovered qthing else" (01/21/2013)  . Syncope 07/2015   a. felt vasovagal in setting of pain from renal infarct.    Past Surgical History:  Procedure Laterality Date  . BALLOON DILATION N/A 05/22/2015   Procedure: BALLOON DILATION;  Surgeon: Gatha Mayer, MD;  Location: [facility] ENDOSCOPY;  Service: Endoscopy;  Laterality: N/A;  . CARDIAC CATHETERIZATION  02/24/2002   reduced LV function, 60-70% prox RCA stenosis, 70% PLA ostial stenosis, 80% secondary branch of PLA stenosis - subsequent CABG (Dr. Jackie Plum)  . Carotid Doppler  06/2012   50-69% right bulb/prox ICA diameter reduction; 70-99% left bulb/prox ICA diameter reduction  . CATARACT EXTRACTION W/ INTRAOCULAR LENS IMPLANT Bilateral 2013  . COLONOSCOPY N/A 05/18/2016   Procedure: COLONOSCOPY;  Surgeon: Manus Gunning, MD;   Location: [facility] ENDOSCOPY;  Service: Gastroenterology;  Laterality: N/A;  . CORONARY ANGIOPLASTY  07/05/2006   3 vessel CAD, patent LIMA to LAD, patentVG to OM, patent SVG to PDA & PLA, mild MR, severe LV systolic dysfunction (Dr. Gerrie Nordmann)  . CORONARY ARTERY BYPASS GRAFT  03/01/2002   LIMA to LAD, reverse SVG to OM, reverse SVG to PDA of RCA, reverse SVG to PLA of RCA, ligation of LA appendage (Dr. Servando Snare)  . ENTEROSCOPY N/A 05/20/2016   Procedure: ENTEROSCOPY;  Surgeon: Manus Gunning, MD;  Location: [facility];  Service: Gastroenterology;  Laterality: N/A;  . ESOPHAGOGASTRODUODENOSCOPY  02/01/2012   Procedure: ESOPHAGOGASTRODUODENOSCOPY (EGD);  Surgeon: Beryle Beams, MD;  Location: [facility];  Service: Endoscopy;  Laterality: N/A;  . ESOPHAGOGASTRODUODENOSCOPY N/A 05/22/2015   Procedure: ESOPHAGOGASTRODUODENOSCOPY (EGD);  Surgeon: Gatha Mayer, MD;  Location: [facility];  Service: Endoscopy;  Laterality: N/A;  . ESOPHAGOGASTRODUODENOSCOPY N/A 05/17/2016   Procedure: ESOPHAGOGASTRODUODENOSCOPY (EGD);  Surgeon: Juanita Craver, MD;  Location: [facility] ENDOSCOPY;  Service: Endoscopy;  Laterality: N/A;  . ESOPHAGOGASTRODUODENOSCOPY N/A 09/17/2017   Procedure: ESOPHAGOGASTRODUODENOSCOPY (EGD);  Surgeon: Milus Banister, MD;  Location: [facility] ENDOSCOPY;  Service: Endoscopy;  Laterality: N/A;  . FOREIGN BODY REMOVAL  09/17/2017   Procedure: FOREIGN BODY REMOVAL;  Surgeon: Milus Banister, MD;  Location: [place];  Service: Endoscopy;;  . GIVENS CAPSULE STUDY N/A 05/18/2016   Procedure: GIVENS CAPSULE STUDY;  Surgeon: Manus Gunning, MD;  Location: [facility];  Service: Gastroenterology;  Laterality: N/A;  . LAPAROSCOPIC APPENDECTOMY  03/30/2011   Procedure: APPENDECTOMY LAPAROSCOPIC;  Surgeon: Joyice Faster. Cornett, MD;  Location: [place];  Service: General;  Laterality: N/A;  . LEFT HEART CATHETERIZATION WITH CORONARY ANGIOGRAM N/A 01/24/2013   Procedure: LEFT HEART CATHETERIZATION WITH CORONARY  ANGIOGRAM;  Surgeon: [name], MD;  Location: [facility] CATH LAB;  Service: Cardiovascular;  Laterality: N/A;  Right heart with grafts  . LITHOTRIPSY     "once" (01/21/2013)  . NM MYOCAR PERF WALL MOTION  05/2009   persantine myoview - fixed moderate perfusion defect in inferior wall & lateral segment of apex (poor non-transmural infarction), minimal anterolateral periinfarct reversible ischemia seen, abnormal study, defects similar to 2006 study  . RIGHT/LEFT HEART CATH AND CORONARY ANGIOGRAPHY N/A 09/17/2017   Procedure:  RIGHT/LEFT HEART CATH AND CORONARY ANGIOGRAPHY;  Surgeon: Martinique, Peter M, MD;  Location: [place] CV LAB;  Service: Cardiovascular;  Laterality: N/A;  . SPLENECTOMY, TOTAL  01/2013  . TEE WITHOUT CARDIOVERSION N/A 01/23/2013   Procedure: TRANSESOPHAGEAL ECHOCARDIOGRAM (TEE);  Surgeon: Sanda Klein, MD;  Location: [facility] ENDOSCOPY;  Service: Cardiovascular;  Laterality: N/A;  . TRANSTHORACIC ECHOCARDIOGRAM  06/23/2012   EF 40-45%, mild LVH; mild AV regurg; mild MV regurg; LV mod-severely dilated; RV mildly dilated; systolic pressure borderline increased; RA mod dilated    Social History   Tobacco Use  . Smoking status: Former Smoker    Packs/day: 2.00    Years: 20.00    Pack years: 40.00    Types: Cigarettes    Last attempt to quit: 04/08/1986    Years since quitting: 31.6  . Smokeless tobacco: Current User    Types: Chew  Substance Use Topics  . Alcohol use: No    Alcohol/week: 0.0 standard drinks  . Drug use: No    Family History  Problem Relation Age of Onset  . Heart disease Father        rheumatic fever  . Heart attack Brother 50  . Stroke Mother   . Stroke Brother 62    Allergies  Allergen Reactions  . Percocet [Oxycodone-Acetaminophen] Itching and Nausea Only    Medication list has been reviewed and updated.  Current Outpatient Medications on File Prior to Visit  Medication Sig Dispense Refill  . apixaban (ELIQUIS) 2.5 MG TABS tablet Take 1  tablet (2.5 mg total) by mouth 2 (two) times daily. 180 tablet 0  . carvedilol (COREG) 3.125 MG tablet TAKE 1 TABLET(3.125 MG) BY MOUTH TWICE DAILY WITH A MEAL 180 tablet 3  . fluticasone (FLONASE) 50 MCG/ACT nasal spray Place 2 sprays into both nostrils daily.    Marland Kitchen guaiFENesin (MUCINEX) 600 MG 12 hr tablet Take 600 mg by mouth 2 (two) times daily as needed for cough or to loosen phlegm.    Marland Kitchen ipratropium (ATROVENT) 0.03 % nasal spray Place 2 sprays into the nose 4 (four) times daily. (Patient taking differently: Place 2 sprays into the nose as needed. ) 30 mL 6  . lisinopril (PRINIVIL,ZESTRIL) 5 MG tablet Take 1 tablet (5 mg total) by mouth daily. 90 tablet 3  . rOPINIRole (REQUIP) 0.25 MG tablet TAKE 1 TABLET(0.25 MG) BY MOUTH TWICE DAILY 180 tablet 3  . rosuvastatin (CRESTOR) 40 MG tablet TAKE 1 TABLET(40 MG) BY MOUTH DAILY 90 tablet 3  . furosemide (LASIX) 40 MG tablet Take 1 tablet (40 mg total) by mouth daily. 30 tablet 0  . pantoprazole (PROTONIX) 40 MG tablet Take 1 tablet (40 mg total) by mouth 2 (two) times daily before a meal. 60 tablet 0   No current facility-administered medications on file prior to visit.     Review of Systems: No fever or chills No fluid retention No swelling  As per HPI- otherwise negative.   Physical Examination: Vitals:   12/01/17 1502  BP: 124/62  Pulse: 68  Resp: 16  Temp: 98.4 F (36.9 C)  SpO2: 97%   Vitals:   12/01/17 1502  Weight: 180 lb (81.6 kg)  Height: 5' 0.5" (1.537 m)   Body mass index is 34.58 kg/m. Ideal Body Weight: Weight in (lb) to have BMI = 25: 129.9  GEN: WDWN, NAD, Non-toxic, A & O x 3, elderly man who looks well today  HEENT: Atraumatic, Normocephalic. Neck supple. No masses, No LAD.  Bilateral  TM wnl, oropharynx normal.  PEERL,EOMI.   Ears and Nose: No external deformity. CV: rate contolled afib, No M/G/R. No JVD. No thrill. No extra heart sounds. PULM: CTA B, no wheezes, crackles, rhonchi. No retractions. No resp.  distress. No accessory muscle use. EXT- no edema  NEURO Normal gait.  PSYCH: Normally interactive. Conversant. Not depressed or anxious appearing.  Calm demeanor.    Assessment and Plan: Persistent atrial fibrillation (HCC)  Chronic kidney disease, unspecified CKD stage - Plan: Comprehensive metabolic panel  Anticoagulated on Eliquis - Plan: CBC  Open wound of right hand without complication, initial encounter - Plan: Td vaccine greater than or equal to 7yo preservative free IM  Need for influenza vaccination - Plan: Flu vaccine HIGH DOSE PF (Fluzone High dose)  Flu and tetanus shots today A fib- rate controlled, eliquis for anticoagulation  He is not retaining fluid He is still have some issues with esophageal stricture but does not want to be treated for this at this time Asked them to continue to watch for any sign of CHF exacerbation Labs pending Follow-up in 6 months   Signed Lamar Blinks, MD  Received his labs 9/19  Results for orders placed or performed in visit on 12/01/17  CBC  Result Value Ref Range   WBC 7.7 4.0 - 10.5 K/uL   RBC 3.81 (L) 4.22 - 5.81 Mil/uL   Platelets 205.0 150.0 - 400.0 K/uL   Hemoglobin 12.1 (L) 13.0 - 17.0 g/dL   HCT 35.2 (L) 39.0 - 52.0 %   MCV 92.3 78.0 - 100.0 fl   MCHC 34.3 30.0 - 36.0 g/dL   RDW 13.7 11.5 - 15.5 %  Comprehensive metabolic panel  Result Value Ref Range   Sodium 140 135 - 145 mEq/L   Potassium 4.1 3.5 - 5.1 mEq/L   Chloride 101 96 - 112 mEq/L   CO2 30 19 - 32 mEq/L   Glucose, Bld 112 (H) 70 - 99 mg/dL   BUN 32 (H) 6 - 23 mg/dL   Creatinine, Ser 2.46 (H) 0.40 - 1.50 mg/dL   Total Bilirubin 0.5 0.2 - 1.2 mg/dL   Alkaline Phosphatase 80 39 - 117 U/L   AST 18 0 - 37 U/L   ALT 11 0 - 53 U/L   Total Protein 7.3 6.0 - 8.3 g/dL   Albumin 3.8 3.5 - 5.2 g/dL   Calcium 9.6 8.4 - 10.5 mg/dL   GFR 26.80 (L) >60.00 mL/min   Creat clearance is 26 Need to hold crestor until kidneys improve  Called 9/19 at 2:45 and  3:15- no answer.  Number for daughter Gregory [name] is not listed. Will keep trying   Called on 9/20- spoke with his nephrologist Dr. Posey Pronto and we decided to cut his lasix in half to 20 mg until he sees nephrology next week.  Relaxed this info to his daughter who will tell her dad the plan

## 2017-12-01 ENCOUNTER — Encounter: Payer: Self-pay | Admitting: Family Medicine

## 2017-12-01 ENCOUNTER — Ambulatory Visit (INDEPENDENT_AMBULATORY_CARE_PROVIDER_SITE_OTHER): Payer: Medicare Other | Admitting: Family Medicine

## 2017-12-01 VITALS — BP 124/62 | HR 68 | Temp 98.4°F | Resp 16 | Ht 60.5 in | Wt 180.0 lb

## 2017-12-01 DIAGNOSIS — Z23 Encounter for immunization: Secondary | ICD-10-CM | POA: Diagnosis not present

## 2017-12-01 DIAGNOSIS — I481 Persistent atrial fibrillation: Secondary | ICD-10-CM

## 2017-12-01 DIAGNOSIS — I4819 Other persistent atrial fibrillation: Secondary | ICD-10-CM

## 2017-12-01 DIAGNOSIS — Z7901 Long term (current) use of anticoagulants: Secondary | ICD-10-CM | POA: Diagnosis not present

## 2017-12-01 DIAGNOSIS — S61401A Unspecified open wound of right hand, initial encounter: Secondary | ICD-10-CM

## 2017-12-01 DIAGNOSIS — N189 Chronic kidney disease, unspecified: Secondary | ICD-10-CM | POA: Diagnosis not present

## 2017-12-01 NOTE — Patient Instructions (Signed)
Good to see you as always! You got a tetanus booster and flu shot today Basic labs as well- I will be in touch with your results asap Continue to watch for signs of fluid overload such as swelling, weight gain or shortness of breath Let's plan to visit in about 6 months

## 2017-12-02 LAB — COMPREHENSIVE METABOLIC PANEL
ALK PHOS: 80 U/L (ref 39–117)
ALT: 11 U/L (ref 0–53)
AST: 18 U/L (ref 0–37)
Albumin: 3.8 g/dL (ref 3.5–5.2)
BILIRUBIN TOTAL: 0.5 mg/dL (ref 0.2–1.2)
BUN: 32 mg/dL — ABNORMAL HIGH (ref 6–23)
CALCIUM: 9.6 mg/dL (ref 8.4–10.5)
CO2: 30 meq/L (ref 19–32)
CREATININE: 2.46 mg/dL — AB (ref 0.40–1.50)
Chloride: 101 mEq/L (ref 96–112)
GFR: 26.8 mL/min — ABNORMAL LOW (ref >60.00)
Glucose, Bld: 112 mg/dL — ABNORMAL HIGH (ref 70–99)
Potassium: 4.1 mEq/L (ref 3.5–5.1)
Sodium: 140 mEq/L (ref 135–145)
TOTAL PROTEIN: 7.3 g/dL (ref 6.0–8.3)

## 2017-12-02 LAB — CBC
HCT: 35.2 % — ABNORMAL LOW (ref 39.0–52.0)
HEMOGLOBIN: 12.1 g/dL — AB (ref 13.0–17.0)
MCHC: 34.3 g/dL (ref 30.0–36.0)
MCV: 92.3 fl (ref 78.0–100.0)
Platelets: 205 10*3/uL (ref 150.0–400.0)
RBC: 3.81 Mil/uL — AB (ref 4.22–5.81)
RDW: 13.7 % (ref 11.5–15.5)
WBC: 7.7 10*3/uL (ref 4.0–10.5)

## 2017-12-08 DIAGNOSIS — I129 Hypertensive chronic kidney disease with stage 1 through stage 4 chronic kidney disease, or unspecified chronic kidney disease: Secondary | ICD-10-CM | POA: Diagnosis not present

## 2017-12-08 DIAGNOSIS — N2581 Secondary hyperparathyroidism of renal origin: Secondary | ICD-10-CM | POA: Diagnosis not present

## 2017-12-08 DIAGNOSIS — N183 Chronic kidney disease, stage 3 (moderate): Secondary | ICD-10-CM | POA: Diagnosis not present

## 2017-12-08 DIAGNOSIS — D631 Anemia in chronic kidney disease: Secondary | ICD-10-CM | POA: Diagnosis not present

## 2017-12-08 DIAGNOSIS — N189 Chronic kidney disease, unspecified: Secondary | ICD-10-CM | POA: Diagnosis not present

## 2017-12-10 ENCOUNTER — Encounter: Payer: Self-pay | Admitting: Family Medicine

## 2018-01-13 ENCOUNTER — Ambulatory Visit: Payer: Medicare Other | Admitting: Cardiovascular Disease

## 2018-01-13 ENCOUNTER — Encounter: Payer: Self-pay | Admitting: Cardiovascular Disease

## 2018-01-13 VITALS — BP 115/63 | HR 65 | Ht 60.5 in | Wt 182.0 lb

## 2018-01-13 DIAGNOSIS — N289 Disorder of kidney and ureter, unspecified: Secondary | ICD-10-CM | POA: Diagnosis not present

## 2018-01-13 DIAGNOSIS — I255 Ischemic cardiomyopathy: Secondary | ICD-10-CM | POA: Diagnosis not present

## 2018-01-13 DIAGNOSIS — I25810 Atherosclerosis of coronary artery bypass graft(s) without angina pectoris: Secondary | ICD-10-CM

## 2018-01-13 DIAGNOSIS — Z951 Presence of aortocoronary bypass graft: Secondary | ICD-10-CM | POA: Diagnosis not present

## 2018-01-13 DIAGNOSIS — I482 Chronic atrial fibrillation, unspecified: Secondary | ICD-10-CM | POA: Diagnosis not present

## 2018-01-13 DIAGNOSIS — I6523 Occlusion and stenosis of bilateral carotid arteries: Secondary | ICD-10-CM | POA: Diagnosis not present

## 2018-01-13 DIAGNOSIS — E785 Hyperlipidemia, unspecified: Secondary | ICD-10-CM

## 2018-01-13 DIAGNOSIS — Z79899 Other long term (current) drug therapy: Secondary | ICD-10-CM

## 2018-01-13 MED ORDER — FUROSEMIDE 40 MG PO TABS: 40.0000 mg | ORAL_TABLET | Freq: Every day | ORAL | 3 refills | Status: DC

## 2018-01-13 MED ORDER — LISINOPRIL 5 MG PO TABS: 5.0000 mg | ORAL_TABLET | Freq: Every day | ORAL | 3 refills | Status: DC

## 2018-01-13 MED ORDER — CARVEDILOL 3.125 MG PO TABS
ORAL_TABLET | ORAL | 3 refills | Status: DC
Start: 2018-01-13 — End: 2018-05-18

## 2018-01-13 MED ORDER — ROSUVASTATIN CALCIUM 40 MG PO TABS: ORAL_TABLET | ORAL | 3 refills | Status: DC

## 2018-01-13 NOTE — Progress Notes (Signed)
Patient ID: Gregory [name], male   DOB: Mar 27, 1932, 84 y.o.   MRN: [id number]     HPI: Gregory [name] is a 84 y.o. male returns for an 3 month  followup cardiology evaluation.  [name] has a history of known CAD in 2003 underwent CABG revascularization surgery by Dr. Servando Snare  (LIMA  to  LAD, vein graft to the obtuse marginal, sequential vein graft to the PDA and PLA of RCA).  His last cardiac catheterization was in 2008 done by Dr. Tami Ribas. At that time, ejection fraction was 20-25%. He declined consideration for ICD. Ejection fraction improved to approximately 35-40% in September 2012 on medical therapy. In the past, he has refused Coumadin therapy and had been maintained on aspirin and Plavix. In 2013 he was hospitalized for appendicitis with abscess requiring laparoscopic appendectomy. He was hospitalized in November 2014 and was felt to have a splenic infarct of embolic etiology. He underwent a transesophageal echocardiogram on 01/23/2013 by Dr. Sallyanne Kuster which revealed an ejection fraction in the range of 15-25%. There was diffuse hypokinesis with severe hypokinesis to akinesis of the anteroseptal apical myocardium. He had increased left ventricular end-diastolic filling pressure and elevated left atrial filling pressure. There was evidence for large solid fixed thrombus occupying the entire atrial appendage with moderate continuous spontaneous echo contrast ("smoke") in the left atrial cavity. There was no evidence for right atrial thrombus. At that time, the patient refused life vest.  He has been on Xarelto for anticoagulation.He denies chest pain. He denies significant shortness of breath. He is unaware of tachdysrhythmias.   He has permanent atrial fibrillation, but his rate has been controlled.  He denies any presyncope or syncope.  He denies any chest pain.  A followup echo Doppler study on 06/14/2013 revealed significant improvement in LV function with an ejection fraction estimated now at  50-55% without regional wall motion abnormalities.  He did have mild aortic root dilatation, mild aortic insufficiency, and mild mitral regurgitation with biatrial enlargement.   He has severe bilateral carotid artery disease.  He had been seen by Dr. Oneida Alar.  His prior carotid duplex scan was significantly abnormal with his left carotid velocities increased to 169 systolic and 678 diastolic with severe fibrous plaque and elevated velocities within all segments of his left internal carotid.  There also was 50-69% diameter reduction in the right bulb/proximal ICA. I referred him to Dr. Gwenlyn Found who subsequently referred him to Dr. Trula Slade.  He denies chest pain.  He denies palpitations.  He denies shortness of breath.  He continues to say he is asymptomatic with reference to his carotid disease and has refused intervention.  When I saw him  Last yehe denied any change in symptomatology.  A follow-up Doppler study on 08/23/2014 showed increased velocities in the left ar  492 over 148 at the MICA level and to 30/67, at the PICA level.  On the right, his PICA velocity was 292/78.  When compared to his prior study.  This has not increased and has remained fairly stable.  He denies chest pain or palpitations.  He denies paresthesias.  He denies visual symptoms.  When I saw him in follow-up,  he remained asymptomatic.  He has refused to consider any potential carotid intervention or surgery.  He was unaware of any fast heartbeats with his chronic atrial fibrillation.  He denies bleeding.  A carotid study earlier today continued to demonstrate severe stenosis with peak  velocity in the left MICA at 500/122 and PICA  241/34. On the right,PICA was 293/106.  At his last office visit, I recommended discontinuing of simvastatin in attempt to be more aggressive with lipid lowering and started Crestor 40 mg daily.  He was hospitalized May 2017  after sustaining a renal infarct with acute kidney injury after taking 3 days off  Xarelto for pain management injection to his back.  He had developed mild anemia with a hemoglobin drop of 3 points leading to a subsequent emergency room evaluation.  A CT of his abdomen and pelvis did not show acute findings or signs of peritoneal bleeding.  He denied any episodes of chest pain,  paresthesias, presyncope or syncope. Or bleeding  An echo Doppler study in May 2017 showed an EF of 55-60%.  There was mild aortic insufficiency, mild aortic root dilatation, mitral annular calcification with mild MR.   When I later saw him in the office, had begun to consider the possibility of maybe in the future thinking about carotid surgery, but he was not ready yet to have this done.  His last carotid study in September 2017 showed left carotid peak systolic velocity of 706 and peak diastolic velocity 237, consistent with a least 80% stenosis.  On the right peak systolic velocity was 628 and diastolic velocity was 85.  He is followed by Dr. Posey Pronto for his chronic kidney disease.  When I last saw him in July 2018 he was feeling well and denied any episodes of chest pain or shortness of breath.  He denied any neurologic symptoms.  He denied paresthesias or dizziness.  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 At that time he was still against consideration for carotid surgery.  He was on aggressive lipid-lowering therapy with target LDL less than 70 and potential induce plaque regression.  He was seen by Mammie Russian in June 2019.  He was on Eliquis without bleeding he was hospitalized from July 2 through July 6 with acute decompensated heart failure complicated by acute hypoxic respiratory failure.  An echo Doppler study showed an EF of 25 to 30% with diffuse hypokinesis which had significantly decreased from 50 to 60% in 2017.  He underwent repeat catheterization which showed occlusion of his native vessels with a patent LIMA to the LAD, a patent vein graft was on 2, and subchondral vein graft  to the PDA/PLA.  He was seen by Lamar Blinks in follow-up on September 29, 2017.  At that time he was feeling well and denied significant shortness of breath and was on lisinopril 5 mg, furosemide 40 mg daily in addition to carvedilol 3.125 mg twice a day.  He is anticoagulated on Eliquis 2.5 mg twice a day.  He is on rosuvastatin 40 mg.  For restless legs he also has been on Requip.   I last saw him in August 2019.  At that time he was doing well and I discussed potential transition to [facility] depending upon improvement in his renal function.  He is followed by Dr. Posey Pronto for his kidney disease.  Creatinine on December 01, 2017 was 2.46.  He denies recurrent chest pain or bleeding.  He is followed by Dr. feels since continues to have no interest in pursuing carotid revascularization.  He presents for evaluation.   Past Medical History:  Diagnosis Date  . Acute respiratory failure ([place])   . Appendicitis with abscess 03/31/2011  . Arthritis   . CAD (coronary artery disease),CABG 1993-LIMA to the LAD, SVG to Om and SVG to  PDA/PLA, patent on cath 2008. 2003   a. s/p CABG 2003. b. last cath 2008 with patent grafts.  . Chronic atrial fibrillation    permanent  . Chronic systolic CHF (congestive heart failure) ([place])    a. Prior low EF, later normalized.  . Essential hypertension   . GERD (gastroesophageal reflux disease)   . Heart murmur   . Hyperlipidemia   . Ischemic cardiomyopathy   . Kidney stones    "passed all but one time when he had to have lithotripsy" (01/21/2013)  . Pulmonary hypertension ([place])   . 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 07/2015 - after holding Xarelto x 3 days for spinal procedure.  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 SSS (sick sinus syndrome) ([place])   . Stroke [facility]) 2007   "slightly drags left foot since; recovered qthing else" (01/21/2013)  . Syncope 07/2015   a. felt vasovagal in setting of pain from renal infarct.    Past Surgical History:  Procedure Laterality Date  . BALLOON  DILATION N/A 05/22/2015   Procedure: BALLOON DILATION;  Surgeon: Gatha Mayer, MD;  Location: [facility] ENDOSCOPY;  Service: Endoscopy;  Laterality: N/A;  . CARDIAC CATHETERIZATION  02/24/2002   reduced LV function, 60-70% prox RCA stenosis, 70% PLA ostial stenosis, 80% secondary branch of PLA stenosis - subsequent CABG (Dr. Jackie Plum)  . Carotid Doppler  06/2012   50-69% right bulb/prox ICA diameter reduction; 70-99% left bulb/prox ICA diameter reduction  . CATARACT EXTRACTION W/ INTRAOCULAR LENS IMPLANT Bilateral 2013  . COLONOSCOPY N/A 05/18/2016   Procedure: COLONOSCOPY;  Surgeon: Manus Gunning, MD;  Location: [facility] ENDOSCOPY;  Service: Gastroenterology;  Laterality: N/A;  . CORONARY ANGIOPLASTY  07/05/2006   3 vessel CAD, patent LIMA to LAD, patentVG to OM, patent SVG to PDA & PLA, mild MR, severe LV systolic dysfunction (Dr. Gerrie Nordmann)  . CORONARY ARTERY BYPASS GRAFT  03/01/2002   LIMA to LAD, reverse SVG to OM, reverse SVG to PDA of RCA, reverse SVG to PLA of RCA, ligation of LA appendage (Dr. Servando Snare)  . ENTEROSCOPY N/A 05/20/2016   Procedure: ENTEROSCOPY;  Surgeon: Manus Gunning, MD;  Location: [place];  Service: Gastroenterology;  Laterality: N/A;  . ESOPHAGOGASTRODUODENOSCOPY  02/01/2012   Procedure: ESOPHAGOGASTRODUODENOSCOPY (EGD);  Surgeon: Beryle Beams, MD;  Location: [facility];  Service: Endoscopy;  Laterality: N/A;  . ESOPHAGOGASTRODUODENOSCOPY N/A 05/22/2015   Procedure: ESOPHAGOGASTRODUODENOSCOPY (EGD);  Surgeon: Gatha Mayer, MD;  Location: [facility];  Service: Endoscopy;  Laterality: N/A;  . ESOPHAGOGASTRODUODENOSCOPY N/A 05/17/2016   Procedure: ESOPHAGOGASTRODUODENOSCOPY (EGD);  Surgeon: Juanita Craver, MD;  Location: [facility] ENDOSCOPY;  Service: Endoscopy;  Laterality: N/A;  . ESOPHAGOGASTRODUODENOSCOPY N/A 09/17/2017   Procedure: ESOPHAGOGASTRODUODENOSCOPY (EGD);  Surgeon: Milus Banister, MD;  Location: [facility] ENDOSCOPY;  Service: Endoscopy;  Laterality: N/A;  .  FOREIGN BODY REMOVAL  09/17/2017   Procedure: FOREIGN BODY REMOVAL;  Surgeon: Milus Banister, MD;  Location: [place];  Service: Endoscopy;;  . GIVENS CAPSULE STUDY N/A 05/18/2016   Procedure: GIVENS CAPSULE STUDY;  Surgeon: Manus Gunning, MD;  Location: [place];  Service: Gastroenterology;  Laterality: N/A;  . LAPAROSCOPIC APPENDECTOMY  03/30/2011   Procedure: APPENDECTOMY LAPAROSCOPIC;  Surgeon: Joyice Faster. Cornett, MD;  Location: [place];  Service: General;  Laterality: N/A;  . LEFT HEART CATHETERIZATION WITH CORONARY ANGIOGRAM N/A 01/24/2013   Procedure: LEFT HEART CATHETERIZATION WITH CORONARY ANGIOGRAM;  Surgeon: [name], MD;  Location: [facility] CATH LAB;  Service: Cardiovascular;  Laterality:  N/A;  Right heart with grafts  . LITHOTRIPSY     "once" (01/21/2013)  . NM MYOCAR PERF WALL MOTION  05/2009   persantine myoview - fixed moderate perfusion defect in inferior wall & lateral segment of apex (poor non-transmural infarction), minimal anterolateral periinfarct reversible ischemia seen, abnormal study, defects similar to 2006 study  . RIGHT/LEFT HEART CATH AND CORONARY ANGIOGRAPHY N/A 09/17/2017   Procedure: RIGHT/LEFT HEART CATH AND CORONARY ANGIOGRAPHY;  Surgeon: Martinique, Peter M, MD;  Location: [place] CV LAB;  Service: Cardiovascular;  Laterality: N/A;  . SPLENECTOMY, TOTAL  01/2013  . TEE WITHOUT CARDIOVERSION N/A 01/23/2013   Procedure: TRANSESOPHAGEAL ECHOCARDIOGRAM (TEE);  Surgeon: Sanda Klein, MD;  Location: [facility] ENDOSCOPY;  Service: Cardiovascular;  Laterality: N/A;  . TRANSTHORACIC ECHOCARDIOGRAM  06/23/2012   EF 40-45%, mild LVH; mild AV regurg; mild MV regurg; LV mod-severely dilated; RV mildly dilated; systolic pressure borderline increased; RA mod dilated    Allergies  Allergen Reactions  . Percocet [Oxycodone-Acetaminophen] Itching and Nausea Only    Current Outpatient Medications  Medication Sig Dispense Refill  . apixaban (ELIQUIS) 2.5 MG TABS tablet  Take 1 tablet (2.5 mg total) by mouth 2 (two) times daily. 180 tablet 0  . carvedilol (COREG) 3.125 MG tablet TAKE 1 TABLET(3.125 MG) BY MOUTH TWICE DAILY WITH A MEAL 180 tablet 3  . fluticasone (FLONASE) 50 MCG/ACT nasal spray Place 2 sprays into both nostrils daily.    Marland Kitchen guaiFENesin (MUCINEX) 600 MG 12 hr tablet Take 600 mg by mouth 2 (two) times daily as needed for cough or to loosen phlegm.    Marland Kitchen ipratropium (ATROVENT) 0.03 % nasal spray Place 2 sprays into the nose 4 (four) times daily. (Patient taking differently: Place 2 sprays into the nose as needed. ) 30 mL 6  . lisinopril (PRINIVIL,ZESTRIL) 5 MG tablet Take 1 tablet (5 mg total) by mouth daily. 90 tablet 3  . rOPINIRole (REQUIP) 0.25 MG tablet TAKE 1 TABLET(0.25 MG) BY MOUTH TWICE DAILY 180 tablet 3  . rosuvastatin (CRESTOR) 40 MG tablet TAKE 1 TABLET(40 MG) BY MOUTH DAILY 90 tablet 3  . furosemide (LASIX) 40 MG tablet Take 1 tablet (40 mg total) by mouth daily. 90 tablet 3  . pantoprazole (PROTONIX) 40 MG tablet Take 1 tablet (40 mg total) by mouth 2 (two) times daily before a meal. 60 tablet 0   No current facility-administered medications for this visit.     Social History   Socioeconomic History  . Marital status: Married    Spouse name: Not on file  . Number of children: 9  . Years of education: 8  . Highest education level: Not on file  Occupational History  . Occupation: retired  Scientific laboratory technician  . Financial resource strain: Not on file  . Food insecurity:    Worry: Not on file    Inability: Not on file  . Transportation needs:    Medical: Not on file    Non-medical: Not on file  Tobacco Use  . Smoking status: Former Smoker    Packs/day: 2.00    Years: 20.00    Pack years: 40.00    Types: Cigarettes    Last attempt to quit: 04/08/1986    Years since quitting: 31.7  . Smokeless tobacco: Current User    Types: Chew  Substance and Sexual Activity  . Alcohol use: No    Alcohol/week: 0.0 standard drinks  . Drug  use: No  . Sexual activity: Not on file  Lifestyle  .  Physical activity:    Days per week: Not on file    Minutes per session: Not on file  . Stress: Not on file  Relationships  . Social connections:    Talks on phone: Not on file    Gets together: Not on file    Attends religious service: Not on file    Active member of club or organization: Not on file    Attends meetings of clubs or organizations: Not on file    Relationship status: Not on file  . Intimate partner violence:    Fear of current or ex partner: Not on file    Emotionally abused: Not on file    Physically abused: Not on file    Forced sexual activity: Not on file  Other Topics Concern  . Not on file  Social History Narrative   A she is married, 5 sons 4 daughters. He is retired Horticulturist, commercial. 2-3 caffeinated beverages a day no alcohol    Family History  Problem Relation Age of Onset  . Heart disease Father        rheumatic fever  . Heart attack Brother 26  . Stroke Mother   . Stroke Brother 68   ROS General: Negative; No fevers, chills, or night sweats;  HEENT: Negative; No changes in vision or hearing, sinus congestion, difficulty swallowing Pulmonary: Negative; No cough, wheezing, shortness of breath, hemoptysis Cardiovascular: See history of present illness GI: positive for GERD; No nausea, vomiting, diarrhea, or abdominal pain GU: Renal infarct Musculoskeletal: Negative; no myalgias, joint pain, or weakness Hematologic/Oncology: Negative; no easy bruising, bleeding Endocrine: Negative; no heat/cold intolerance; no diabetes Neuro: positive for remote CVA Skin: Negative; No rashes or skin lesions Psychiatric: Negative; No behavioral problems, depression Sleep: Positive for restless legs; no snoring, daytime sleepiness, hypersomnolence, bruxism, , hypnogognic hallucinations, no cataplexy Other comprehensive 14 point system review is negative.   PE BP 115/63   Pulse 65   Ht 5' 0.5" (1.537 m)   Wt  182 lb (82.6 kg)   BMI 34.96 kg/m    Repeat blood pressure by me was 120/64  Wt Readings from Last 3 Encounters:  01/13/18 182 lb (82.6 kg)  12/01/17 180 lb (81.6 kg)  10/21/17 180 lb (81.6 kg)   General: Alert, oriented, no distress.  Skin: normal turgor, no rashes, warm and dry HEENT: Normocephalic, atraumatic. Pupils equal round and reactive to light; sclera anicteric; extraocular muscles intact;  Nose without nasal septal hypertrophy Mouth/Parynx benign; Mallinpatti scale 3 Neck: No JVD, bilateral carotid bruits; normal carotid upstroke Lungs: clear to ausculatation and percussion; no wheezing or rales Chest wall: without tenderness to palpitation Heart: PMI not displaced, RRR, s1 s2 normal, 1/6 systolic murmur, no diastolic murmur, no rubs, gallops, thrills, or heaves Abdomen: soft, nontender; no hepatosplenomehaly, BS+; abdominal aorta nontender and not dilated by palpation. Back: no CVA tenderness Pulses 2+ Musculoskeletal: full range of motion, normal strength, no joint deformities Extremities: no clubbing cyanosis or edema, Homan's sign negative  Neurologic: grossly nonfocal; Cranial nerves grossly wnl Psychologic: Normal mood and affect   ECG (independently read by me): Atrial fibrillation at 65 bpm.  Inferior Q waves.  Poor anterior R wave progression V1 through V4  August 2019 ECG (independently read by me): AF vs junctional at 17; old anterolateral MI; QTc 497 msec  July 2018 ECG (independently read by me): Atrial fibrillation at 60 bpm.  Low voltage.  Probable old anterolateral MI with poor anterior R-wave progression.  Small inferior  Q waves.  December 2017 ECG (independently read by me): Atrial fibrillation with a rate at 58 bpm.  QS complex anteriorly.  Low voltage.  September 2017 ECG (independently read by me): Atrial fibrillation with premature ventricular beats.  Poor R wave progression anteriorly.  Small nondiagnostic inferior Q waves.  December 2016 ECG  (independently read by me): atrial fibrillation with rate in the 60s with occasional PVC.  QTc interval 452 ms  August 2016 ECG (independently read by me): Atrial fibrillation at 67 bpm.  2 ventricular premature beats.  Poor R-wave progression  May 2016 ECG (independently read by me): Atrial fibrillation with occasional PVCs.  Heart rate 72.  ECG (independently read by me): Atrial fibrillation with controlled ventricular response in the 70s.  Poor anterior R-wave progression.  Prior April 2015 ECG (independently read by me): Atrial fibrillation at 56 beats per minute.  QTc interval 395 ms  Prior 02/23/2013 ECG: Atrial fibrillation at 80 beats per minute with isolated PVC.  LABS: BMP Latest Ref Rng & Units 01/13/2018 12/01/2017 09/18/2017  Glucose 65 - 99 mg/dL 138(H) 112(H) 122(H)  BUN 8 - 27 mg/dL 21 32(H) 20  Creatinine 0.76 - 1.27 mg/dL 1.75(H) 2.46(H) 1.29(H)  BUN/Creat Ratio 10 - 24 12 - -  Sodium 134 - 144 mmol/L 141 140 139  Potassium 3.5 - 5.2 mmol/L 4.5 4.1 4.2  Chloride 96 - 106 mmol/L 102 101 107  CO2 20 - 29 mmol/L '25 30 26  '$ Calcium 8.6 - 10.2 mg/dL 8.7 9.6 8.5(L)   Hepatic Function Latest Ref Rng & Units 12/01/2017 09/14/2017 08/25/2017  Total Protein 6.0 - 8.3 g/dL 7.3 7.1 6.5  Albumin 3.5 - 5.2 g/dL 3.8 3.3(L) 3.8  AST 0 - 37 U/L 18 26 35  ALT 0 - 53 U/L '11 15 28  '$ Alk Phosphatase 39 - 117 U/L 80 83 107  Total Bilirubin 0.2 - 1.2 mg/dL 0.5 1.2 0.4  Bilirubin, Direct 0.0 - 0.3 mg/dL - - -   CBC Latest Ref Rng & Units 12/01/2017 09/18/2017 09/17/2017  WBC 4.0 - 10.5 K/uL 7.7 7.1 6.9  Hemoglobin 13.0 - 17.0 g/dL 12.1(L) 10.5(L) 9.7(L)  Hematocrit 39.0 - 52.0 % 35.2(L) 33.0(L) 30.2(L)  Platelets 150.0 - 400.0 K/uL 205.0 196 190   Lab Results  Component Value Date   MCV 92.3 12/01/2017   MCV 99.7 09/18/2017   MCV 98.4 09/17/2017   Lab Results  Component Value Date   TSH 4.970 (H) 09/28/2016   Lab Results  Component Value Date   HGBA1C 6.5 04/22/2016   Lipid Panel      Component Value Date/Time   CHOL 131 08/25/2017 1137   TRIG 105 08/25/2017 1137   HDL 36 (L) 08/25/2017 1137   CHOLHDL 3.6 08/25/2017 1137   CHOLHDL 2.8 11/27/2015 0830   VLDL 19 11/27/2015 0830   LDLCALC 74 08/25/2017 1137     IMPRESSION:  1. Ischemic cardiomyopathy   2. Coronary artery disease involving coronary bypass graft of native heart without angina pectoris   3. Hx of CABG   4. Chronic atrial fibrillation   5. Bilateral carotid artery stenosis   6. Renal insufficiency   7. Hyperlipidemia with target LDL less than 70   8. Medication management     ASSESSMENT AND PLAN: Mr. Khaliel Morey is a 84 year old gentleman who is status post CABG revascularization surgery in 2003. He has a history of an  ischemic cardiomyopathy with an initial ejection fraction of 25% with subsequent normalization at 55-60%,  however on his most recent echo Doppler study during his July 2019 hospitalization EF was again 25 to 30%.  There was mild AR, mild to moderate MR, biatrial enlargement and mild TR.  He had akinesis of the distal anteroseptal wall and apex.  He has a remote history of a small right brain CVA in 2005  with subsequent recovery. 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  In the past he was documented to have prior thrombus in his left atrium and had been on Xarelto.  He is on Eliquis for anticoagulation.  He has high-grade carotid disease and continues to be followed by Dr. Ruta Hinds.  I again reviewed his cardiac catheterization from September 17, 2017 which revealed graft dependency and patent grafts.  He is without recurrent anginal symptomatology.  He denies shortness of breath PND orthopnea and continues to be on lisinopril 5 mg carvedilol 3.125 mg twice a day and furosemide 40 mg daily.  Most recent creatinine 1 month ago was 2.46.  With his kidney disease I will not transition him to [facility] presently will do so only if  significant improvement.  I have recommended a follow-up Bmet today.  He is followed by Dr. Posey Pronto who is aware that he is on ACE inhibition.  He continues to be on rosuvastatin 40 mg for hyperlipidemia.  Laboratory in June 2019 showed an LDL of 74.  He has permanent atrial fibrillation, ECG shows rate control in the 60s.  In December, I have recommended a follow-up echo Doppler study to reassess his LV function I will see him in the office for follow-up evaluation.   Time spent: 25 minutes  Troy Sine, MD, [facility]  01/14/2018 8:00 AM

## 2018-01-13 NOTE — Patient Instructions (Addendum)
Medication Instructions:  Your physician recommends that you continue on your current medications as directed. Please refer to the Current Medication list given to you today.  If you need a refill on your cardiac medications before your next appointment, please call your pharmacy.   Lab work: Today (BMET) If you have labs (blood work) drawn today and your tests are completely normal, you will receive your results only by: Marland Kitchen MyChart Message (if you have MyChart) OR . A paper copy in the mail If you have any lab test that is abnormal or we need to change your treatment, we will call you to review the results.  Testing/Procedures: Your physician has requested that you have an echocardiogram in December. Echocardiography is a painless test that uses sound waves to create images of your heart. It provides your doctor with information about the size and shape of your heart and how well your heart's chambers and valves are working. This procedure takes approximately one hour. There are no restrictions for this procedure.  This will be done at our [facility] location:  [place]: At [facility], you and your health needs are our priority.  As part of our continuing mission to provide you with exceptional heart care, we have created designated Provider Care Teams.  These Care Teams include your primary Cardiologist (physician) and Advanced Practice Providers (APPs -  Physician Assistants and Nurse Practitioners) who all work together to provide you with the care you need, when you need it. You will need a follow up appointment in 2 months (after echo).  Please call our office 2 months in advance to schedule this appointment.  You may see Shelva Majestic, MD or one of the following Advanced Practice Providers on your designated Care Team: [place], [place] . Fabian Sharp, PA-C

## 2018-01-14 ENCOUNTER — Encounter: Payer: Self-pay | Admitting: Cardiovascular Disease

## 2018-01-14 LAB — BASIC METABOLIC PANEL
BUN / CREAT RATIO: 12 (ref 10–24)
BUN: 21 mg/dL (ref 8–27)
CO2: 25 mmol/L (ref 20–29)
CREATININE: 1.75 mg/dL — AB (ref 0.76–1.27)
Calcium: 8.7 mg/dL (ref 8.6–10.2)
Chloride: 102 mmol/L (ref 96–106)
GFR calc non Af Amer: 35 mL/min/{1.73_m2} — ABNORMAL LOW (ref >59)
GFR, EST AFRICAN AMERICAN: 41 mL/min/{1.73_m2} — AB (ref >59)
GLUCOSE: 138 mg/dL — AB (ref 65–99)
Potassium: 4.5 mmol/L (ref 3.5–5.2)
SODIUM: 141 mmol/L (ref 134–144)

## 2018-01-14 NOTE — Addendum Note (Signed)
Addended by: Leanord Asal T on: 01/14/2018 03:07 PM   Modules accepted: Orders

## 2018-01-20 ENCOUNTER — Encounter (HOSPITAL_COMMUNITY): Payer: Medicare Other

## 2018-01-20 ENCOUNTER — Ambulatory Visit: Payer: Medicare Other | Admitting: Vascular Surgery

## 2018-02-14 ENCOUNTER — Encounter: Payer: Self-pay | Admitting: Family Medicine

## 2018-02-14 DIAGNOSIS — N183 Chronic kidney disease, stage 3 (moderate): Secondary | ICD-10-CM | POA: Diagnosis not present

## 2018-02-14 DIAGNOSIS — N189 Chronic kidney disease, unspecified: Secondary | ICD-10-CM | POA: Diagnosis not present

## 2018-02-14 DIAGNOSIS — N2581 Secondary hyperparathyroidism of renal origin: Secondary | ICD-10-CM | POA: Diagnosis not present

## 2018-02-23 DIAGNOSIS — N183 Chronic kidney disease, stage 3 (moderate): Secondary | ICD-10-CM | POA: Diagnosis not present

## 2018-02-23 DIAGNOSIS — D631 Anemia in chronic kidney disease: Secondary | ICD-10-CM | POA: Diagnosis not present

## 2018-02-23 DIAGNOSIS — I129 Hypertensive chronic kidney disease with stage 1 through stage 4 chronic kidney disease, or unspecified chronic kidney disease: Secondary | ICD-10-CM | POA: Diagnosis not present

## 2018-02-23 DIAGNOSIS — N2581 Secondary hyperparathyroidism of renal origin: Secondary | ICD-10-CM | POA: Diagnosis not present

## 2018-02-28 ENCOUNTER — Other Ambulatory Visit: Payer: Self-pay | Admitting: Cardiovascular Disease

## 2018-03-02 ENCOUNTER — Other Ambulatory Visit: Payer: Self-pay

## 2018-03-02 ENCOUNTER — Ambulatory Visit (HOSPITAL_COMMUNITY): Payer: Medicare Other | Attending: Cardiology

## 2018-03-02 ENCOUNTER — Encounter (INDEPENDENT_AMBULATORY_CARE_PROVIDER_SITE_OTHER): Payer: Self-pay

## 2018-03-02 DIAGNOSIS — I255 Ischemic cardiomyopathy: Secondary | ICD-10-CM

## 2018-03-21 ENCOUNTER — Other Ambulatory Visit: Payer: Self-pay | Admitting: Cardiovascular Disease

## 2018-03-22 ENCOUNTER — Other Ambulatory Visit: Payer: Self-pay | Admitting: Cardiovascular Disease

## 2018-03-24 ENCOUNTER — Telehealth: Payer: Self-pay | Admitting: Cardiovascular Disease

## 2018-03-24 MED ORDER — PANTOPRAZOLE SODIUM 40 MG PO TBEC: 40.0000 mg | DELAYED_RELEASE_TABLET | Freq: Two times a day (BID) | ORAL | 3 refills | Status: DC

## 2018-03-24 NOTE — Telephone Encounter (Signed)
Last office visit with Dr Claiborne Billings patient taking twice a day and no changes. Refill sent to pharmacy as requested.

## 2018-03-24 NOTE — Telephone Encounter (Signed)
New message    Pt c/o medication issue:  1. Name of Medication: pantoprazole (PROTONIX) 40 MG tablet  2. How are you currently taking this medication (dosage and times per day)? 2 times daily  3. Are you having a reaction (difficulty breathing--STAT)? No   4. What is your medication issue?Patient's daughter states that need a new prescription. Patient needs to take 2 daily.

## 2018-04-28 ENCOUNTER — Ambulatory Visit: Payer: Medicare Other | Admitting: Vascular Surgery

## 2018-04-28 ENCOUNTER — Inpatient Hospital Stay (HOSPITAL_COMMUNITY): Admission: RE | Admit: 2018-04-28 | Payer: Medicare Other | Source: Ambulatory Visit

## 2018-05-03 ENCOUNTER — Other Ambulatory Visit: Payer: Self-pay | Admitting: Cardiovascular Disease

## 2018-05-03 NOTE — Telephone Encounter (Signed)
°*  STAT* If patient is at the pharmacy, call can be transferred to refill team.   1. Which medications need to be refilled? (please list name of each medication and dose if known)  ELIQUIS 2.5 MG TABS tablet  2. Which pharmacy/location (including street and city if local pharmacy) is medication to be sent to? [id number]  3. Do they need a 30 day or 90 day supply? 90   Pt is Out of Medication. Called the Pharmacy and the Pharmacy recommended Pt's Daughter call the Dr's Office

## 2018-05-18 ENCOUNTER — Ambulatory Visit: Payer: Medicare Other | Admitting: Cardiovascular Disease

## 2018-05-18 ENCOUNTER — Encounter: Payer: Self-pay | Admitting: Cardiovascular Disease

## 2018-05-18 VITALS — BP 106/70 | HR 66 | Ht 66.5 in | Wt 188.2 lb

## 2018-05-18 DIAGNOSIS — E785 Hyperlipidemia, unspecified: Secondary | ICD-10-CM | POA: Diagnosis not present

## 2018-05-18 DIAGNOSIS — Z951 Presence of aortocoronary bypass graft: Secondary | ICD-10-CM | POA: Diagnosis not present

## 2018-05-18 DIAGNOSIS — Z7901 Long term (current) use of anticoagulants: Secondary | ICD-10-CM

## 2018-05-18 DIAGNOSIS — I6523 Occlusion and stenosis of bilateral carotid arteries: Secondary | ICD-10-CM | POA: Diagnosis not present

## 2018-05-18 DIAGNOSIS — N184 Chronic kidney disease, stage 4 (severe): Secondary | ICD-10-CM

## 2018-05-18 DIAGNOSIS — I25810 Atherosclerosis of coronary artery bypass graft(s) without angina pectoris: Secondary | ICD-10-CM

## 2018-05-18 DIAGNOSIS — I4821 Permanent atrial fibrillation: Secondary | ICD-10-CM | POA: Diagnosis not present

## 2018-05-18 DIAGNOSIS — Z79899 Other long term (current) drug therapy: Secondary | ICD-10-CM | POA: Diagnosis not present

## 2018-05-18 DIAGNOSIS — I10 Essential (primary) hypertension: Secondary | ICD-10-CM

## 2018-05-18 MED ORDER — ROSUVASTATIN CALCIUM 40 MG PO TABS: ORAL_TABLET | ORAL | 3 refills | Status: DC

## 2018-05-18 MED ORDER — APIXABAN 2.5 MG PO TABS: ORAL_TABLET | ORAL | 2 refills | Status: DC

## 2018-05-18 MED ORDER — LISINOPRIL 5 MG PO TABS: 5.0000 mg | ORAL_TABLET | Freq: Every day | ORAL | 3 refills | Status: DC

## 2018-05-18 MED ORDER — CARVEDILOL 3.125 MG PO TABS: ORAL_TABLET | ORAL | 3 refills | Status: DC

## 2018-05-18 NOTE — Patient Instructions (Addendum)
Medication Instructions:  The current medical regimen is effective;  continue present plan and medications.  If you need a refill on your cardiac medications before your next appointment, please call your pharmacy.   Lab work: Fasting blood work today (CBC, CMET, TSH, LIPID, A1C) If you have labs (blood work) drawn today and your tests are completely normal, you will receive your results only by: Marland Kitchen MyChart Message (if you have MyChart) OR . A paper copy in the mail If you have any lab test that is abnormal or we need to change your treatment, we will call you to review the results.   Follow-Up: At [facility], you and your health needs are our priority.  As part of our continuing mission to provide you with exceptional heart care, we have created designated Provider Care Teams.  These Care Teams include your primary Cardiologist (physician) and Advanced Practice Providers (APPs -  Physician Assistants and Nurse Practitioners) who all work together to provide you with the care you need, when you need it. You will need a follow up appointment in 6 months.  Please call our office 2 months in advance to schedule this appointment.  You may see Shelva Majestic, MD or one of the following Advanced Practice Providers on your designated Care Team: [place], [place] . Fabian Sharp, PA-C

## 2018-05-18 NOTE — Progress Notes (Signed)
Patient ID: Gregory [name], male   DOB: 15-May-1932, 85 y.o.   MRN: [id number]     HPI: Gregory [name] is a 85 y.o. male returns for an 3 month  followup cardiology evaluation.  Gregory [name] has a history of known CAD in 2003 underwent CABG revascularization surgery by Dr. Servando Snare  (LIMA  to  LAD, vein graft to the obtuse marginal, sequential vein graft to the PDA and PLA of RCA).  His last cardiac catheterization was in 2008 done by Dr. Tami Ribas. At that time, ejection fraction was 20-25%. He declined consideration for ICD. Ejection fraction improved to approximately 35-40% in September 2012 on medical therapy. In the past, he has refused Coumadin therapy and had been maintained on aspirin and Plavix. In 2013 he was hospitalized for appendicitis with abscess requiring laparoscopic appendectomy. He was hospitalized in November 2014 and was felt to have a splenic infarct of embolic etiology. He underwent a transesophageal echocardiogram on 01/23/2013 by Dr. Sallyanne Kuster which revealed an ejection fraction in the range of 15-25%. There was diffuse hypokinesis with severe hypokinesis to akinesis of the anteroseptal apical myocardium. He had increased left ventricular end-diastolic filling pressure and elevated left atrial filling pressure. There was evidence for large solid fixed thrombus occupying the entire atrial appendage with moderate continuous spontaneous echo contrast ("smoke") in the left atrial cavity. There was no evidence for right atrial thrombus. At that time, the patient refused life vest.  He has been on Xarelto for anticoagulation.He denies chest pain. He denies significant shortness of breath. He is unaware of tachdysrhythmias.   He has permanent atrial fibrillation, but his rate has been controlled.  He denies any presyncope or syncope.  He denies any chest pain.  A followup echo Doppler study on 06/14/2013 revealed significant improvement in LV function with an ejection fraction estimated now at  50-55% without regional wall motion abnormalities.  He did have mild aortic root dilatation, mild aortic insufficiency, and mild mitral regurgitation with biatrial enlargement.   He has severe bilateral carotid artery disease.  He had been seen by Dr. Oneida Alar.  His prior carotid duplex scan was significantly abnormal with his left carotid velocities increased to 443 systolic and 154 diastolic with severe fibrous plaque and elevated velocities within all segments of his left internal carotid.  There also was 50-69% diameter reduction in the right bulb/proximal ICA. I referred him to Dr. Gwenlyn Found who subsequently referred him to Dr. Trula Slade.  He denies chest pain.  He denies palpitations.  He denies shortness of breath.  He continues to say he is asymptomatic with reference to his carotid disease and has refused intervention.  When I saw him  Last yehe denied any change in symptomatology.  A follow-up Doppler study on 08/23/2014 showed increased velocities in the left ar  492 over 148 at the MICA level and to 30/67, at the PICA level.  On the right, his PICA velocity was 292/78.  When compared to his prior study.  This has not increased and has remained fairly stable.  He denies chest pain or palpitations.  He denies paresthesias.  He denies visual symptoms.  When I saw him in follow-up,  he remained asymptomatic.  He has refused to consider any potential carotid intervention or surgery.  He was unaware of any fast heartbeats with his chronic atrial fibrillation.  He denies bleeding.  A carotid study earlier today continued to demonstrate severe stenosis with peak  velocity in the left MICA at 500/122 and PICA  241/34. On the right,PICA was 293/106.  At his last office visit, I recommended discontinuing of simvastatin in attempt to be more aggressive with lipid lowering and started Crestor 40 mg daily.  He was hospitalized May 2017  after sustaining a renal infarct with acute kidney injury after taking 3 days off  Xarelto for pain management injection to his back.  He had developed mild anemia with a hemoglobin drop of 3 points leading to a subsequent emergency room evaluation.  A CT of his abdomen and pelvis did not show acute findings or signs of peritoneal bleeding.  He denied any episodes of chest pain,  paresthesias, presyncope or syncope. Or bleeding  An echo Doppler study in May 2017 showed an EF of 55-60%.  There was mild aortic insufficiency, mild aortic root dilatation, mitral annular calcification with mild MR.   When I later saw him in the office, had begun to consider the possibility of maybe in the future thinking about carotid surgery, but he was not ready yet to have this done.  His last carotid study in September 2017 showed left carotid peak systolic velocity of 196 and peak diastolic velocity 222, consistent with a least 80% stenosis.  On the right peak systolic velocity was 979 and diastolic velocity was 85.  He is followed by Dr. Posey Pronto for his chronic kidney disease.  When I last saw him in July 2018 he was feeling well and denied any episodes of chest pain or shortness of breath.  He denied any neurologic symptoms.  He denied paresthesias or dizziness.  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 At that time he was still against consideration for carotid surgery.  He was on aggressive lipid-lowering therapy with target LDL less than 70 and potential induce plaque regression.  He was seen by Mammie Russian in June 2019.  He was on Eliquis without bleeding he was hospitalized from July 2 through July 6 with acute decompensated heart failure complicated by acute hypoxic respiratory failure.  An echo Doppler study showed an EF of 25 to 30% with diffuse hypokinesis which had significantly decreased from 50 to 60% in 2017.  He underwent repeat catheterization which showed occlusion of his native vessels with a patent LIMA to the LAD, a patent vein graft was on 2, and subchondral vein graft  to the PDA/PLA.  He was seen by Lamar Blinks in follow-up on September 29, 2017.  At that time he was feeling well and denied significant shortness of breath and was on lisinopril 5 mg, furosemide 40 mg daily in addition to carvedilol 3.125 mg twice a day.  He is anticoagulated on Eliquis 2.5 mg twice a day.  He is on rosuvastatin 40 mg.  For restless legs he also has been on Requip.   I last saw him in August 2019.  At that time he was doing well and I discussed potential transition to [facility] depending upon improvement in his renal function.  He is followed by Dr. Posey Pronto for his kidney disease.  Creatinine on December 01, 2017 was 2.46.  He denies recurrent chest pain or bleeding.  He is followed by Dr. Oneida Alar since continues to have no interest in pursuing carotid revascularization.    Since I last saw him in October 2019 he has continued to do well.  He remains active.  He sees Dr. Posey Pronto who follows his chronic kidney disease.  He denies dizziness.  He denies chest pain.  He is unaware of  palpitations.  He denies bleeding on Eliquis.  He presents for reevaluation.  Past Medical History:  Diagnosis Date  . Acute respiratory failure ([place])   . Appendicitis with abscess 03/31/2011  . Arthritis   . CAD (coronary artery disease),CABG 1993-LIMA to the LAD, SVG to Om and SVG to PDA/PLA, patent on cath 2008. 2003   a. s/p CABG 2003. b. last cath 2008 with patent grafts.  . Chronic atrial fibrillation    permanent  . Chronic systolic CHF (congestive heart failure) ([place])    a. Prior low EF, later normalized.  . Essential hypertension   . GERD (gastroesophageal reflux disease)   . Heart murmur   . Hyperlipidemia   . Ischemic cardiomyopathy   . Kidney stones    "passed all but one time when he had to have lithotripsy" (01/21/2013)  . Pulmonary hypertension ([place])   . 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 07/2015 - after holding Xarelto x 3 days for spinal procedure.  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 SSS (sick sinus  syndrome) ([place])   . Stroke [facility]) 2007   "slightly drags left foot since; recovered qthing else" (01/21/2013)  . Syncope 07/2015   a. felt vasovagal in setting of pain from renal infarct.    Past Surgical History:  Procedure Laterality Date  . BALLOON DILATION N/A 05/22/2015   Procedure: BALLOON DILATION;  Surgeon: Gatha Mayer, MD;  Location: [facility] ENDOSCOPY;  Service: Endoscopy;  Laterality: N/A;  . CARDIAC CATHETERIZATION  02/24/2002   reduced LV function, 60-70% prox RCA stenosis, 70% PLA ostial stenosis, 80% secondary branch of PLA stenosis - subsequent CABG (Dr. Jackie Plum)  . Carotid Doppler  06/2012   50-69% right bulb/prox ICA diameter reduction; 70-99% left bulb/prox ICA diameter reduction  . CATARACT EXTRACTION W/ INTRAOCULAR LENS IMPLANT Bilateral 2013  . COLONOSCOPY N/A 05/18/2016   Procedure: COLONOSCOPY;  Surgeon: Manus Gunning, MD;  Location: [facility] ENDOSCOPY;  Service: Gastroenterology;  Laterality: N/A;  . CORONARY ANGIOPLASTY  07/05/2006   3 vessel CAD, patent LIMA to LAD, patentVG to OM, patent SVG to PDA & PLA, mild MR, severe LV systolic dysfunction (Dr. Gerrie Nordmann)  . CORONARY ARTERY BYPASS GRAFT  03/01/2002   LIMA to LAD, reverse SVG to OM, reverse SVG to PDA of RCA, reverse SVG to PLA of RCA, ligation of LA appendage (Dr. Servando Snare)  . ENTEROSCOPY N/A 05/20/2016   Procedure: ENTEROSCOPY;  Surgeon: Manus Gunning, MD;  Location: [place];  Service: Gastroenterology;  Laterality: N/A;  . ESOPHAGOGASTRODUODENOSCOPY  02/01/2012   Procedure: ESOPHAGOGASTRODUODENOSCOPY (EGD);  Surgeon: Beryle Beams, MD;  Location: [facility];  Service: Endoscopy;  Laterality: N/A;  . ESOPHAGOGASTRODUODENOSCOPY N/A 05/22/2015   Procedure: ESOPHAGOGASTRODUODENOSCOPY (EGD);  Surgeon: Gatha Mayer, MD;  Location: [facility] ENDOS[facility];  Service: Endoscopy;  Laterality: N/A;  . ESOPHAGOGASTRODUODENOSCOPY N/A 05/17/2016   Procedure: ESOPHAGOGASTRODUODENOSCOPY (EGD);  Surgeon: Juanita Craver, MD;   Location: [facility] ENDOSCOPY;  Service: Endoscopy;  Laterality: N/A;  . ESOPHAGOGASTRODUODENOSCOPY N/A 09/17/2017   Procedure: ESOPHAGOGASTRODUODENOSCOPY (EGD);  Surgeon: Milus Banister, MD;  Location: [facility] ENDOSCOPY;  Service: Endoscopy;  Laterality: N/A;  . FOREIGN BODY REMOVAL  09/17/2017   Procedure: FOREIGN BODY REMOVAL;  Surgeon: Milus Banister, MD;  Location: [place];  Service: Endoscopy;;  . GIVENS CAPSULE STUDY N/A 05/18/2016   Procedure: GIVENS CAPSULE STUDY;  Surgeon: Manus Gunning, MD;  Location: [place];  Service: Gastroenterology;  Laterality: N/A;  . LAPAROSCOPIC APPENDECTOMY  03/30/2011   Procedure: APPENDECTOMY  LAPAROSCOPIC;  Surgeon: Joyice Faster. Cornett, MD;  Location: [place];  Service: General;  Laterality: N/A;  . LEFT HEART CATHETERIZATION WITH CORONARY ANGIOGRAM N/A 01/24/2013   Procedure: LEFT HEART CATHETERIZATION WITH CORONARY ANGIOGRAM;  Surgeon: [name], MD;  Location: [facility] CATH LAB;  Service: Cardiovascular;  Laterality: N/A;  Right heart with grafts  . LITHOTRIPSY     "once" (01/21/2013)  . NM MYOCAR PERF WALL MOTION  05/2009   persantine myoview - fixed moderate perfusion defect in inferior wall & lateral segment of apex (poor non-transmural infarction), minimal anterolateral periinfarct reversible ischemia seen, abnormal study, defects similar to 2006 study  . RIGHT/LEFT HEART CATH AND CORONARY ANGIOGRAPHY N/A 09/17/2017   Procedure: RIGHT/LEFT HEART CATH AND CORONARY ANGIOGRAPHY;  Surgeon: Martinique, Peter M, MD;  Location: [place] CV LAB;  Service: Cardiovascular;  Laterality: N/A;  . SPLENECTOMY, TOTAL  01/2013  . TEE WITHOUT CARDIOVERSION N/A 01/23/2013   Procedure: TRANSESOPHAGEAL ECHOCARDIOGRAM (TEE);  Surgeon: Sanda Klein, MD;  Location: [facility] ENDOSCOPY;  Service: Cardiovascular;  Laterality: N/A;  . TRANSTHORACIC ECHOCARDIOGRAM  06/23/2012   EF 40-45%, mild LVH; mild AV regurg; mild MV regurg; LV mod-severely dilated; RV mildly dilated; systolic  pressure borderline increased; RA mod dilated    Allergies  Allergen Reactions  . Percocet [Oxycodone-Acetaminophen] Itching and Nausea Only    Current Outpatient Medications  Medication Sig Dispense Refill  . apixaban (ELIQUIS) 2.5 MG TABS tablet TAKE 1 TABLET(2.5 MG) BY MOUTH TWICE DAILY 180 tablet 2  . carvedilol (COREG) 3.125 MG tablet TAKE 1 TABLET(3.125 MG) BY MOUTH TWICE DAILY WITH A MEAL 180 tablet 3  . fluticasone (FLONASE) 50 MCG/ACT nasal spray Place 2 sprays into both nostrils daily.    Marland Kitchen guaiFENesin (MUCINEX) 600 MG 12 hr tablet Take 600 mg by mouth 2 (two) times daily as needed for cough or to loosen phlegm.    Marland Kitchen ipratropium (ATROVENT) 0.03 % nasal spray Place 2 sprays into the nose 4 (four) times daily. (Patient taking differently: Place 2 sprays into the nose as needed. ) 30 mL 6  . lisinopril (PRINIVIL,ZESTRIL) 5 MG tablet Take 1 tablet (5 mg total) by mouth daily. 90 tablet 3  . pantoprazole (PROTONIX) 40 MG tablet Take 1 tablet (40 mg total) by mouth 2 (two) times daily. 180 tablet 3  . rOPINIRole (REQUIP) 0.25 MG tablet TAKE 1 TABLET(0.25 MG) BY MOUTH TWICE DAILY 180 tablet 3  . rosuvastatin (CRESTOR) 40 MG tablet TAKE 1 TABLET(40 MG) BY MOUTH DAILY 90 tablet 3  . furosemide (LASIX) 40 MG tablet Take 1 tablet (40 mg total) by mouth daily. 90 tablet 3   No current facility-administered medications for this visit.     Social History   Socioeconomic History  . Marital status: Married    Spouse name: Not on file  . Number of children: 9  . Years of education: 8  . Highest education level: Not on file  Occupational History  . Occupation: retired  Scientific laboratory technician  . Financial resource strain: Not on file  . Food insecurity:    Worry: Not on file    Inability: Not on file  . Transportation needs:    Medical: Not on file    Non-medical: Not on file  Tobacco Use  . Smoking status: Former Smoker    Packs/day: 2.00    Years: 20.00    Pack years: 40.00    Types:  Cigarettes    Last attempt to quit: 04/08/1986    Years  since quitting: 32.1  . Smokeless tobacco: Current User    Types: Chew  Substance and Sexual Activity  . Alcohol use: No    Alcohol/week: 0.0 standard drinks  . Drug use: No  . Sexual activity: Not on file  Lifestyle  . Physical activity:    Days per week: Not on file    Minutes per session: Not on file  . Stress: Not on file  Relationships  . Social connections:    Talks on phone: Not on file    Gets together: Not on file    Attends religious service: Not on file    Active member of club or organization: Not on file    Attends meetings of clubs or organizations: Not on file    Relationship status: Not on file  . Intimate partner violence:    Fear of current or ex partner: Not on file    Emotionally abused: Not on file    Physically abused: Not on file    Forced sexual activity: Not on file  Other Topics Concern  . Not on file  Social History Narrative   A she is married, 5 sons 4 daughters. He is retired Horticulturist, commercial. 2-3 caffeinated beverages a day no alcohol    Family History  Problem Relation Age of Onset  . Heart disease Father        rheumatic fever  . Heart attack Brother 21  . Stroke Mother   . Stroke Brother 68   ROS General: Negative; No fevers, chills, or night sweats;  HEENT: Negative; No changes in vision or hearing, sinus congestion, difficulty swallowing Pulmonary: Negative; No cough, wheezing, shortness of breath, hemoptysis Cardiovascular: See history of present illness GI: positive for GERD; No nausea, vomiting, diarrhea, or abdominal pain GU: Renal infarct Musculoskeletal: Negative; no myalgias, joint pain, or weakness Hematologic/Oncology: Negative; no easy bruising, bleeding Endocrine: Negative; no heat/cold intolerance; no diabetes Neuro: positive for remote CVA Skin: Negative; No rashes or skin lesions Psychiatric: Negative; No behavioral problems, depression Sleep: Positive for  restless legs; no snoring, daytime sleepiness, hypersomnolence, bruxism, , hypnogognic hallucinations, no cataplexy Other comprehensive 14 point system review is negative.   PE BP 106/70   Pulse 66   Ht 5' 6.5" (1.689 m)   Wt 188 lb 3.2 oz (85.4 kg)   BMI 29.92 kg/m    Repeat blood pressure by me was 120/74  Wt Readings from Last 3 Encounters:  05/18/18 188 lb 3.2 oz (85.4 kg)  01/13/18 182 lb (82.6 kg)  12/01/17 180 lb (81.6 kg)   General: Alert, oriented, no distress.  Skin: normal turgor, no rashes, warm and dry HEENT: Normocephalic, atraumatic. Pupils equal round and reactive to light; sclera anicteric; extraocular muscles intact;  Nose without nasal septal hypertrophy Mouth/Parynx benign; Mallinpatti scale 3 Neck: Bilateral carotid bruit, left greater than right; no JVD,  normal carotid upstroke Lungs: clear to ausculatation and percussion; no wheezing or rales Chest wall: without tenderness to palpitation Heart: PMI not displaced, regular irregular rhythm with a rate in the 60s, s1 s2 normal, 1/6 systolic murmur, no diastolic murmur, no rubs, gallops, thrills, or heaves Abdomen: soft, nontender; no hepatosplenomehaly, BS+; abdominal aorta nontender and not dilated by palpation. Back: no CVA tenderness Pulses 2+ Musculoskeletal: full range of motion, normal strength, no joint deformities Extremities: no clubbing cyanosis or edema, Homan's sign negative  Neurologic: grossly nonfocal; Cranial nerves grossly wnl Psychologic: Normal mood and affect   ECG (independently read by me): Atrial fibrillation  at 66 bpm.  Right axis deviation.  Poor anterior R wave progression V1 through V5.  DC interval for 36 ms.  January 13, 2018 ECG (independently read by me): Atrial fibrillation at 65 bpm.  Inferior Q waves.  Poor anterior R wave progression V1 through V4  August 2019 ECG (independently read by me): AF vs junctional at 71; old anterolateral MI; QTc 497 msec  July 2018 ECG  (independently read by me): Atrial fibrillation at 60 bpm.  Low voltage.  Probable old anterolateral MI with poor anterior R-wave progression.  Small inferior Q waves.  December 2017 ECG (independently read by me): Atrial fibrillation with a rate at 58 bpm.  QS complex anteriorly.  Low voltage.  September 2017 ECG (independently read by me): Atrial fibrillation with premature ventricular beats.  Poor R wave progression anteriorly.  Small nondiagnostic inferior Q waves.  December 2016 ECG (independently read by me): atrial fibrillation with rate in the 60s with occasional PVC.  QTc interval 452 ms  August 2016 ECG (independently read by me): Atrial fibrillation at 67 bpm.  2 ventricular premature beats.  Poor R-wave progression  May 2016 ECG (independently read by me): Atrial fibrillation with occasional PVCs.  Heart rate 72.  ECG (independently read by me): Atrial fibrillation with controlled ventricular response in the 70s.  Poor anterior R-wave progression.  Prior April 2015 ECG (independently read by me): Atrial fibrillation at 56 beats per minute.  QTc interval 395 ms  Prior 02/23/2013 ECG: Atrial fibrillation at 80 beats per minute with isolated PVC.  LABS: BMP Latest Ref Rng & Units 01/13/2018 12/01/2017 09/18/2017  Glucose 65 - 99 mg/dL 138(H) 112(H) 122(H)  BUN 8 - 27 mg/dL 21 32(H) 20  Creatinine 0.76 - 1.27 mg/dL 1.75(H) 2.46(H) 1.29(H)  BUN/Creat Ratio 10 - 24 12 - -  Sodium 134 - 144 mmol/L 141 140 139  Potassium 3.5 - 5.2 mmol/L 4.5 4.1 4.2  Chloride 96 - 106 mmol/L 102 101 107  CO2 20 - 29 mmol/L '25 30 26  '$ Calcium 8.6 - 10.2 mg/dL 8.7 9.6 8.5(L)   Hepatic Function Latest Ref Rng & Units 12/01/2017 09/14/2017 08/25/2017  Total Protein 6.0 - 8.3 g/dL 7.3 7.1 6.5  Albumin 3.5 - 5.2 g/dL 3.8 3.3(L) 3.8  AST 0 - 37 U/L 18 26 35  ALT 0 - 53 U/L '11 15 28  '$ Alk Phosphatase 39 - 117 U/L 80 83 107  Total Bilirubin 0.2 - 1.2 mg/dL 0.5 1.2 0.4  Bilirubin, Direct 0.0 - 0.3 mg/dL - - -     CBC Latest Ref Rng & Units 12/01/2017 09/18/2017 09/17/2017  WBC 4.0 - 10.5 K/uL 7.7 7.1 6.9  Hemoglobin 13.0 - 17.0 g/dL 12.1(L) 10.5(L) 9.7(L)  Hematocrit 39.0 - 52.0 % 35.2(L) 33.0(L) 30.2(L)  Platelets 150.0 - 400.0 K/uL 205.0 196 190   Lab Results  Component Value Date   MCV 92.3 12/01/2017   MCV 99.7 09/18/2017   MCV 98.4 09/17/2017   Lab Results  Component Value Date   TSH 4.970 (H) 09/28/2016   Lab Results  Component Value Date   HGBA1C 6.5 04/22/2016   Lipid Panel     Component Value Date/Time   CHOL 131 08/25/2017 1137   TRIG 105 08/25/2017 1137   HDL 36 (L) 08/25/2017 1137   CHOLHDL 3.6 08/25/2017 1137   CHOLHDL 2.8 11/27/2015 0830   VLDL 19 11/27/2015 0830   LDLCALC 74 08/25/2017 1137     IMPRESSION:  1. Hyperlipidemia with target  LDL less than 70   2. Coronary artery disease involving coronary bypass graft of native heart without angina pectoris   3. Hx of CABG   4. Bilateral carotid artery stenosis   5. Permanent atrial fibrillation   6. Anticoagulation adequate   7. Medication management   8. Essential hypertension   9. CKD (chronic kidney disease), stage IV [facility])     ASSESSMENT AND PLAN: [name] is a 85 year old gentleman who is status post CABG revascularization surgery in 2003. He has a history of an  ischemic cardiomyopathy with an initial ejection fraction of 25% with subsequent normalization at 55-60%, however on his last echo Doppler study during his July 2019 hospitalization EF was again 25 to 30%.  There was mild AR, mild to moderate MR, biatrial enlargement and mild TR.  He had akinesis of the distal anteroseptal wall and apex.  He has a remote history of a small right brain CVA in 2005  with subsequent recovery. 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  In the past he was documented to have prior thrombus in his left atrium and had been on Xarelto.  He is  Now on Eliquis  for anticoagulation reduced dose secondary to his age and renal dysfunction..  He has high-grade carotid disease and continues to be followed by Dr. Ruta Hinds.  His cardiac catheterization from September 17, 2017 revealed graft dependency with occlusion of native vessels and and patent grafts.  He is without recurrent anginal symptomatology.  Since he has denied any surgical revascularization for his carotid disease I been trying to be very aggressive with reference to lipids to hopefully induce plaque regression.  He continues also to be on anticoagulation.  His blood pressure today is stable on furosemide 40 mg, lisinopril 5 mg in addition to carvedilol 3.125 mg twice a day.  His atrial fibrillation rate is well controlled.  He is not having bleeding on Eliquis.  He is now on rosuvastatin 40 mg.  His last lipid studies in June 2019 showed an LDL of 74.  He is fasting today.  I will check a complete set of fasting laboratory including chemistry, lipid studies, CBC, TSH and hemoglobin A1c.  Adjustments to his medical therapy will be made if necessary.  He has chronic kidney disease with his last creatinine at 2.14 in December 2019.  He still is being followed by Dr. Posey Pronto.  He will be seeing his [facility] physician Dr. Janett Billow [name] in several weeks and she will have the results of the laboratory performed today for her evaluation.  As long as he remains stable I will see him in 6 months for cardiology reassessment.  Time spent: 25 minutes  Troy Sine, MD, [facility]  05/18/2018 1:56 PM

## 2018-05-19 LAB — CBC
Hematocrit: 36.9 % — ABNORMAL LOW (ref 37.5–51.0)
Hemoglobin: 12.6 g/dL — ABNORMAL LOW (ref 13.0–17.7)
MCH: 30.8 pg (ref 26.6–33.0)
MCHC: 34.1 g/dL (ref 31.5–35.7)
MCV: 90 fL (ref 79–97)
PLATELETS: 186 10*3/uL (ref 150–450)
RBC: 4.09 x10E6/uL — AB (ref 4.14–5.80)
RDW: 13.4 % (ref 11.6–15.4)
WBC: 6.2 10*3/uL (ref 3.4–10.8)

## 2018-05-19 LAB — COMPREHENSIVE METABOLIC PANEL
ALBUMIN: 3.8 g/dL (ref 3.6–4.6)
ALK PHOS: 94 IU/L (ref 39–117)
ALT: 15 IU/L (ref 0–44)
AST: 23 IU/L (ref 0–40)
Albumin/Globulin Ratio: 1.2 (ref 1.2–2.2)
BUN / CREAT RATIO: 11 (ref 10–24)
BUN: 20 mg/dL (ref 8–27)
Bilirubin Total: 0.4 mg/dL (ref 0.0–1.2)
CHLORIDE: 103 mmol/L (ref 96–106)
CO2: 24 mmol/L (ref 20–29)
Calcium: 9.4 mg/dL (ref 8.6–10.2)
Creatinine, Ser: 1.74 mg/dL — ABNORMAL HIGH (ref 0.76–1.27)
GFR calc Af Amer: 41 mL/min/{1.73_m2} — ABNORMAL LOW (ref >59)
GFR calc non Af Amer: 35 mL/min/{1.73_m2} — ABNORMAL LOW (ref >59)
GLOBULIN, TOTAL: 3.2 g/dL (ref 1.5–4.5)
Glucose: 81 mg/dL (ref 65–99)
Potassium: 4.6 mmol/L (ref 3.5–5.2)
Sodium: 144 mmol/L (ref 134–144)
Total Protein: 7 g/dL (ref 6.0–8.5)

## 2018-05-19 LAB — LIPID PANEL
Chol/HDL Ratio: 2.4 ratio (ref 0.0–5.0)
Cholesterol, Total: 136 mg/dL (ref 100–199)
HDL: 56 mg/dL (ref >39)
LDL Calculated: 69 mg/dL (ref 0–99)
Triglycerides: 57 mg/dL (ref 0–149)
VLDL CHOLESTEROL CAL: 11 mg/dL (ref 5–40)

## 2018-05-19 LAB — TSH: TSH: 5.99 u[IU]/mL — ABNORMAL HIGH (ref 0.450–4.500)

## 2018-05-19 LAB — HEMOGLOBIN A1C
Est. average glucose Bld gHb Est-mCnc: 128 mg/dL
HEMOGLOBIN A1C: 6.1 % — AB (ref 4.8–5.6)

## 2018-05-31 NOTE — Progress Notes (Deleted)
[facility] [address], [place], [place] [address] [phone]  Date:  06/01/2018   Name:  Gregory [name]   DOB:  12-08-1932   MRN:  [id number]  PCP:  Darreld Mclean, MD    Chief Complaint: No chief complaint on file.   History of Present Illness:  Gregory [name] is a 85 y.o. very pleasant male patient who presents with the following:  Here today for a follow-up visit.  He was seen by cardiology earlier this month Dr. Claiborne Billings as always did a very thorough job going at this visit.  He has had complete labs, and health maintenance is up-to-date  Patient Active Problem List   Diagnosis Date Noted  . Esophageal obstruction due to food impaction   . Acute congestive heart failure ([place])   . Acute respiratory failure ([place]) 09/14/2017  . Acute on chronic systolic heart failure ([place])   . Fatigue 08/06/2017  . Symptomatic anemia   . Chronic anemia 01/13/2016  . Chronic renal failure, stage 3 (moderate) ([place]) 08/06/2015  . Vasovagal syncope   . Coronary artery disease involving native coronary artery   . 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 Syncope 07/30/2015  . Esophageal stricture   . Carotid stenosis 07/13/2013  . Anticoagulated on Eliquis 03/21/2013  . PVD - 96% LICA, 75% RICA by doppler 5/14 01/24/2013  . 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 CAD (coronary artery disease),CABG 1993-LIMA to the LAD, SVG to Om and SVG to PDA/PLA, patent on cath 2014. 03/31/2011  . Chronic a-fib 03/31/2011  . Cardiomyopathy, ischemic, improved 03/31/2011  . CVA (cerebral vascular accident) ([place]) 03/31/2011  . GERD (gastroesophageal reflux disease) 03/31/2011  . Renal calculi 03/31/2011  . History of tobacco use, continues with chewing tobacco 03/31/2011    Past Medical History:  Diagnosis Date  . Acute respiratory failure ([place])   . Appendicitis with abscess 03/31/2011   . Arthritis   . CAD (coronary artery disease),CABG 1993-LIMA to the LAD, SVG to Om and SVG to PDA/PLA, patent on cath 2008. 2003   a. s/p CABG 2003. b. last cath 2008 with patent grafts.  . Chronic atrial fibrillation    permanent  . Chronic systolic CHF (congestive heart failure) ([place])    a. Prior low EF, later normalized.  . Essential hypertension   . GERD (gastroesophageal reflux disease)   . Heart murmur   . Hyperlipidemia   . Ischemic cardiomyopathy   . Kidney stones    "passed all but one time when he had to have lithotripsy" (01/21/2013)  . Pulmonary hypertension ([place])   . 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 07/2015 - after holding Xarelto x 3 days for spinal procedure.  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 SSS (sick sinus syndrome) ([place])   . Stroke [facility]) 2007   "slightly drags left foot since; recovered qthing else" (01/21/2013)  . Syncope 07/2015   a. felt vasovagal in setting of pain from renal infarct.    Past Surgical History:  Procedure Laterality Date  . BALLOON DILATION N/A 05/22/2015   Procedure: BALLOON DILATION;  Surgeon: Gatha Mayer, MD;  Location: [facility] ENDOSCOPY;  Service: Endoscopy;  Laterality: N/A;  . CARDIAC CATHETERIZATION  02/24/2002   reduced LV function, 60-70% prox RCA stenosis, 70% PLA ostial stenosis, 80% secondary branch of PLA stenosis - subsequent CABG (Dr. Jackie Plum)  . Carotid  Doppler  06/2012   50-69% right bulb/prox ICA diameter reduction; 70-99% left bulb/prox ICA diameter reduction  . CATARACT EXTRACTION W/ INTRAOCULAR LENS IMPLANT Bilateral 2013  . COLONOSCOPY N/A 05/18/2016   Procedure: COLONOSCOPY;  Surgeon: Manus Gunning, MD;  Location[facility] ENDOSCOPY;  Service: Gastroenterology;  Laterality: N/A;  . CORONARY ANGIOPLASTY  07/05/2006   3 vessel CAD, patent LIMA to LAD, patentVG to OM, patent SVG to PDA & PLA, mild MR, severe LV systolic dysfunction (Dr. Gerrie Nordmann)  . CORONARY ARTERY BYPASS GRAFT  03/01/2002   LIMA to LAD, reverse SVG to OM, reverse  SVG to PDA of RCA, reverse SVG to PLA of RCA, ligation of LA appendage (Dr. Servando Snare)  . ENTEROSCOPY N/A 05/20/2016   Procedure: ENTEROSCOPY;  Surgeon: Manus Gunning, MD;  Location: [place];  Service: Gastroenterology;  Laterality: N/A;  . ESOPHAGOGASTRODUODENOSCOPY  02/01/2012   Procedure: ESOPHAGOGASTRODUODENOSCOPY (EGD);  Surgeon: Beryle Beams, MD;  Location: [facility];  Service: Endoscopy;  Laterality: N/A;  . ESOPHAGOGASTRODUODENOSCOPY N/A 05/22/2015   Procedure: ESOPHAGOGASTRODUODENOSCOPY (EGD);  Surgeon: Gatha Mayer, MD;  Location: [facility];  Service: Endoscopy;  Laterality: N/A;  . ESOPHAGOGASTRODUODENOSCOPY N/A 05/17/2016   Procedure: ESOPHAGOGASTRODUODENOSCOPY (EGD);  Surgeon: Juanita Craver, MD;  Location: [facility] ENDOSCOPY;  Service: Endoscopy;  Laterality: N/A;  . ESOPHAGOGASTRODUODENOSCOPY N/A 09/17/2017   Procedure: ESOPHAGOGASTRODUODENOSCOPY (EGD);  Surgeon: Milus Banister, MD;  Location: [facility] ENDOSCOPY;  Service: Endoscopy;  Laterality: N/A;  . FOREIGN BODY REMOVAL  09/17/2017   Procedure: FOREIGN BODY REMOVAL;  Surgeon: Milus Banister, MD;  Location: [facility];  Service: Endoscopy;;  . GIVENS CAPSULE STUDY N/A 05/18/2016   Procedure: GIVENS CAPSULE STUDY;  Surgeon: Manus Gunning, MD;  Location: [facility];  Service: Gastroenterology;  Laterality: N/A;  . LAPAROSCOPIC APPENDECTOMY  03/30/2011   Procedure: APPENDECTOMY LAPAROSCOPIC;  Surgeon: Joyice Faster. Cornett, MD;  Location: [place] [facility];  Service: General;  Laterality: N/A;  . LEFT HEART CATHETERIZATION WITH CORONARY ANGIOGRAM N/A 01/24/2013   Procedure: LEFT HEART CATHETERIZATION WITH CORONARY ANGIOGRAM;  Surgeon: [name], MD;  Location: [facility] CATH LAB;  Service: Cardiovascular;  Laterality: N/A;  Right heart with grafts  . LITHOTRIPSY     "once" (01/21/2013)  . NM MYOCAR PERF WALL MOTION  05/2009   persantine myoview - fixed moderate perfusion defect in inferior wall & lateral segment of apex (poor  non-transmural infarction), minimal anterolateral periinfarct reversible ischemia seen, abnormal study, defects similar to 2006 study  . RIGHT/LEFT HEART CATH AND CORONARY ANGIOGRAPHY N/A 09/17/2017   Procedure: RIGHT/LEFT HEART CATH AND CORONARY ANGIOGRAPHY;  Surgeon: Martinique, Peter M, MD;  Location: [facility] CV LAB;  Service: Cardiovascular;  Laterality: N/A;  . SPLENECTOMY, TOTAL  01/2013  . TEE WITHOUT CARDIOVERSION N/A 01/23/2013   Procedure: TRANSESOPHAGEAL ECHOCARDIOGRAM (TEE);  Surgeon: Sanda Klein, MD;  Location: [facility] ENDOSCOPY;  Service: Cardiovascular;  Laterality: N/A;  . TRANSTHORACIC ECHOCARDIOGRAM  06/23/2012   EF 40-45%, mild LVH; mild AV regurg; mild MV regurg; LV mod-severely dilated; RV mildly dilated; systolic pressure borderline increased; RA mod dilated    Social History   Tobacco Use  . Smoking status: Former Smoker    Packs/day: 2.00    Years: 20.00    Pack years: 40.00    Types: Cigarettes    Last attempt to quit: 04/08/1986    Years since quitting: 32.1  . Smokeless tobacco: Current User    Types: Chew  Substance Use Topics  . Alcohol use: No  Alcohol/week: 0.0 standard drinks  . Drug use: No    Family History  Problem Relation Age of Onset  . Heart disease Father        rheumatic fever  . Heart attack Brother 58  . Stroke Mother   . Stroke Brother 28    Allergies  Allergen Reactions  . Percocet [Oxycodone-Acetaminophen] Itching and Nausea Only    Medication list has been reviewed and updated.  Current Outpatient Medications on File Prior to Visit  Medication Sig Dispense Refill  . apixaban (ELIQUIS) 2.5 MG TABS tablet TAKE 1 TABLET(2.5 MG) BY MOUTH TWICE DAILY 180 tablet 2  . carvedilol (COREG) 3.125 MG tablet TAKE 1 TABLET(3.125 MG) BY MOUTH TWICE DAILY WITH A MEAL 180 tablet 3  . fluticasone (FLONASE) 50 MCG/ACT nasal spray Place 2 sprays into both nostrils daily.    . furosemide (LASIX) 40 MG tablet Take 1 tablet (40 mg total) by mouth  daily. 90 tablet 3  . guaiFENesin (MUCINEX) 600 MG 12 hr tablet Take 600 mg by mouth 2 (two) times daily as needed for cough or to loosen phlegm.    Marland Kitchen ipratropium (ATROVENT) 0.03 % nasal spray Place 2 sprays into the nose 4 (four) times daily. (Patient taking differently: Place 2 sprays into the nose as needed. ) 30 mL 6  . lisinopril (PRINIVIL,ZESTRIL) 5 MG tablet Take 1 tablet (5 mg total) by mouth daily. 90 tablet 3  . pantoprazole (PROTONIX) 40 MG tablet Take 1 tablet (40 mg total) by mouth 2 (two) times daily. 180 tablet 3  . rOPINIRole (REQUIP) 0.25 MG tablet TAKE 1 TABLET(0.25 MG) BY MOUTH TWICE DAILY 180 tablet 3  . rosuvastatin (CRESTOR) 40 MG tablet TAKE 1 TABLET(40 MG) BY MOUTH DAILY 90 tablet 3   No current facility-administered medications on file prior to visit.     Review of Systems:  ***  Physical Examination: There were no vitals filed for this visit. There were no vitals filed for this visit. There is no height or weight on file to calculate BMI. Ideal Body Weight:    ***  Assessment and Plan: ***  Signed Lamar Blinks, MD

## 2018-06-01 ENCOUNTER — Ambulatory Visit: Payer: Medicare Other | Admitting: Family Medicine

## 2018-06-21 DIAGNOSIS — S61102A Unspecified open wound of left thumb with damage to nail, initial encounter: Secondary | ICD-10-CM | POA: Diagnosis not present

## 2018-07-22 ENCOUNTER — Ambulatory Visit (INDEPENDENT_AMBULATORY_CARE_PROVIDER_SITE_OTHER): Payer: Medicare Other | Admitting: Medical

## 2018-07-22 ENCOUNTER — Encounter: Payer: Self-pay | Admitting: Medical

## 2018-07-22 ENCOUNTER — Other Ambulatory Visit: Payer: Self-pay

## 2018-07-22 VITALS — BP 137/62 | HR 73 | Temp 98.4°F | Wt 172.0 lb

## 2018-07-22 DIAGNOSIS — R197 Diarrhea, unspecified: Secondary | ICD-10-CM | POA: Diagnosis not present

## 2018-07-22 NOTE — Patient Instructions (Signed)
Patient is had recent 2 days of diarrhea about 5 loose stools a day.  Not reporting any fever.  No nausea or vomiting associated.  Remote history of some antibiotics about a month ago but onset of diarrhea did not coincide with antibiotics so doubt c dif.   Blood pressure and pulse looks stable multiple.  Await from looks stable.  This visit was done about 15 to 20 minutes before closing.  Unable to get CBC CMP or give patient gastro panel.  I thought in light of patient's age and recent history this would be beneficial.  We are approaching the weekend did explain to family that want him to stay well-hydrated.  Can use Gatorade 0 or propel over-the-counter.  They could do delayed this with 50% water as instructed by their kidney specialist.  Also continue give Sprite as he seemed to tolerate that well.  Also can give Ensure daily.  Would recommend bland foods  I explained to watch to make sure that his urine output is normal.  Check your blood pressure daily and weights daily.  Blood pressure drops, pulse increases and losing weight and these would be indicators to be seen in the emergency department this weekend.  If does well on the weekend still want patient to come in to get this CMP, CBC and pickup gastro panel.  If diarrhea still about 5 times a day then would consider giving 3-day course of antibiotics pending gastro panel results.  Going to ask staff to get patient scheduled at 9 AM approximately for the labs.  I sent lab message before 4 PM but no response.  They might of been at staff meeting.

## 2018-07-22 NOTE — Progress Notes (Signed)
Subjective:    Patient ID: Gregory [name], male    DOB: 08-13-1932, 85 y.o.   MRN: [id number]  HPI  Virtual Visit via Video Note  I connected with Gregory [name] on 07/22/18 at  3:40 PM EDT by a video enabled telemedicine application and verified that I am speaking with the correct person using two identifiers.  Location: Patient: home. Provider: home.     I discussed the limitations of evaluation and management by telemedicine and the availability of in person appointments. The patient expressed understanding and agreed to proceed.  History of Present Illness: Pt had some diarrhea since yesterday morning and some today as well. He ha 5-6 loose stools today and 5-6 loose stools today. Pt cut his thumb about 2-3 weeks ago. He was on antibiotic after he had thumb laceration repair. That antiobitic was wirtten on 06-21-2018. Ended antibiotic on 06-28-2018.   Wednesday night he had 3 pickled eggs before his diarrhea.  No family members sick.   He feels little weak. He did take sprite, pepto bismal and one ensure. He ate oatmeal and one peace of toast.   Stools are little watery.  Pt over last year had low bp. Also hx of low kidney function.  Pt has had good urine output last day.   Slight weak this morning but presently stating he feel pretty good.  No constipation that preceded diarrhea.      Observations/Objective General- no acute distress. Alert oriented Lungs- appears to be breathing normal/unlabored. Neuro- gross motor function looks intact. Skin- normal skin tone.  Assessment and Plan: Patient is had recent 2 days of diarrhea about 5 loose stools a day.  Not reporting any fever.  No nausea or vomiting associated.  Remote history of some antibiotics about a month ago but onset of diarrhea did not coincide with antibiotics so doubt c dif.   Blood pressure and pulse looks stable multiple.  Await from looks stable.  This visit was done about 15 to 20 minutes before  closing.  Unable to get CBC CMP or give patient gastro panel.  I thought in light of patient's age and recent history this would be beneficial.  We are approaching the weekend did explain to family that want him to stay well-hydrated.  Can use Gatorade 0 or propel over-the-counter.  They could do delayed this with 50% water as instructed by their kidney specialist.  Also continue give Sprite as he seemed to tolerate that well.  Also can give Ensure daily.  Would recommend bland foods  I explained to watch to make sure that his urine output is normal.  Check your blood pressure daily and weights daily.  Blood pressure drops, pulse increases and losing weight and these would be indicators to be seen in the emergency department this weekend.  If does well on the weekend still want patient to come in to get this CMP, CBC and pickup gastro panel.  If diarrhea still about 5 times a day then would consider giving 3-day course of antibiotics pending gastro panel results.  Going to ask staff to get patient scheduled at 9 AM approximately for the labs.  I sent lab message before 4 PM but no response.  They might of been at staff meeting.  Mackie Pai, PA-C  Follow Up Instructions:    I discussed the assessment and treatment plan with the patient. The patient was provided an opportunity to ask questions and all were answered. The patient agreed with the  plan and demonstrated an understanding of the instructions.   The patient was advised to call back or seek an in-person evaluation if the symptoms worsen or if the condition fails to improve as anticipated.     Mackie Pai, PA-C    Review of Systems     Objective:   Physical Exam        Assessment & Plan:

## 2018-07-25 ENCOUNTER — Encounter: Payer: Self-pay | Admitting: Family Medicine

## 2018-07-25 ENCOUNTER — Telehealth: Payer: Self-pay | Admitting: Medical

## 2018-07-25 ENCOUNTER — Telehealth: Payer: Self-pay

## 2018-07-25 ENCOUNTER — Ambulatory Visit (INDEPENDENT_AMBULATORY_CARE_PROVIDER_SITE_OTHER): Payer: Medicare Other | Admitting: Family Medicine

## 2018-07-25 ENCOUNTER — Other Ambulatory Visit: Payer: Self-pay

## 2018-07-25 DIAGNOSIS — R197 Diarrhea, unspecified: Secondary | ICD-10-CM | POA: Diagnosis not present

## 2018-07-25 NOTE — Telephone Encounter (Signed)
Talked with lab. Since he has diarrhea our lab can't see him/do blood test. So he has to be seen in ED.  The reason is diarrhea is one of many potential covid symptoms. I doubt he has but this management policy that we can't see pt with diarrhea.  Let pt daughter know to go to ED at [facility].  I can talk with daughter if necessary

## 2018-07-25 NOTE — Telephone Encounter (Addendum)
Patient notified. States he/daughter has WEB visit with J [name] today.

## 2018-07-25 NOTE — Telephone Encounter (Signed)
I have talked with lab and they are aware he is coming in. But before they come in would you call and get blood pressure reading this morning, pulse and ask if is lethargic or dizzy?

## 2018-07-25 NOTE — Telephone Encounter (Signed)
Called patient to schedule appointment for virtual visit. States his daughter talked with Dr. Lorelei Pont and he has appointment today via WB at 2pm. Informed E. Saguier,PA-C.

## 2018-07-25 NOTE — Progress Notes (Signed)
[place] [facility] [address] [place], [place] [address] [phone]  Date:  07/25/2018   Name:  Gregory [name]   DOB:  04-16-1932   MRN:  [id number]  PCP:  Darreld Mclean, MD    Chief Complaint: No chief complaint on file.   History of Present Illness:  Gregory [name] is a 85 y.o. very pleasant male patient who presents with the following:  Virtual visit today due to pandemic Patient location is home Prior location is office Patient identity confirmed with 2 identifiers.  His daughter Gregory [name] is also on the call.  They are okay with a first visit today  Gregory [name] has history of current A. fib, CAD status post CABG in 2003, cardiomyopathy, hypertension, carotid artery stenosis, restless legs, renal insufficiency  His cardiologist is Gregory [name] He is also a GI patient, history of esophageal stricture status post dilation in 2017 Nephrologist is Dr. Alphonse Guild is married to Gregory [name], his daughter Gregory [name] is also very involved in his care Visit with Gregory [name] in [place] that time he was stable, they planned for 33-monthfollow-up He consulted with my partner EPercell Millerlast week, with 2 days of diarrhea.  Virtual visit.  His visit was so late in the day today we are not able to get labs.   He notes that he still has diarrhea- now present for 5 days Today is Monday Thursday and Friday were bad- it is now improved. He went 4x today -he will have a stool following oral intake No fever No belly pain No blood in his stool, no black stool He is just having watery diarrhea No vomiting, no nausea   His weight, temp and BP have been ok at home He is not able to eat very much due to poor appetite-however this is also improving He is taking 2-3 ensure shakes daily Also drinking sprite, water He ate 2 meals yesterday So far today grits, toast, ensure, various beverages  He is overall not feeling too bad He is not dizzy when he  stands up  He did use abx for a wound in his finger- he was on keflex from 4/7- took for one week He has tried imodium and pepto-these helped somewhat  They also recall that he ate 3 pickled eggs shortly before onset of symptoms.  His daughter wonders if perhaps he has food poisoning   Patient Active Problem List   Diagnosis Date Noted  . Esophageal obstruction due to food impaction   . Acute congestive heart failure ([place]   . Acute respiratory failure ([place] 09/14/2017  . Acute on chronic systolic heart failure ([place]   . Fatigue 08/06/2017  . Symptomatic anemia   . Chronic anemia 01/13/2016  . Chronic renal failure, stage 3 (moderate) ([place] 08/06/2015  . Vasovagal syncope   . Coronary artery disease involving native coronary artery   . Renal infarction ([place] 07/31/2015  . Syncope 07/30/2015  . Esophageal stricture   . Carotid stenosis 07/13/2013  . Anticoagulated on Eliquis 03/21/2013  . PVD - 940%LICA, 781%RICA by doppler 5/14 01/24/2013  . 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 CAD (coronary artery disease),CABG 1993-LIMA to the LAD, SVG to Om and SVG to PDA/PLA, patent on cath 2014. 03/31/2011  . Chronic a-fib 03/31/2011  . Cardiomyopathy, ischemic, improved 03/31/2011  . CVA (cerebral vascular accident) ([place] 03/31/2011  .  GERD (gastroesophageal reflux disease) 03/31/2011  . Renal calculi 03/31/2011  . History of tobacco use, continues with chewing tobacco 03/31/2011    Past Medical History:  Diagnosis Date  . Acute respiratory failure ([place])   . Appendicitis with abscess 03/31/2011  . Arthritis   . CAD (coronary artery disease),CABG 1993-LIMA to the LAD, SVG to Om and SVG to PDA/PLA, patent on cath 2008. 2003   a. s/p CABG 2003. b. last cath 2008 with patent grafts.  . Chronic atrial fibrillation    permanent  . Chronic systolic CHF (congestive heart failure) ([place])    a. Prior low EF, later normalized.  .  Essential hypertension   . GERD (gastroesophageal reflux disease)   . Heart murmur   . Hyperlipidemia   . Ischemic cardiomyopathy   . Kidney stones    "passed all but one time when he had to have lithotripsy" (01/21/2013)  . Pulmonary hypertension ([place])   . 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 07/2015 - after holding Xarelto x 3 days for spinal procedure.  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 SSS (sick sinus syndrome) ([place])   . Stroke [facility]) 2007   "slightly drags left foot since; recovered qthing else" (01/21/2013)  . Syncope 07/2015   a. felt vasovagal in setting of pain from renal infarct.    Past Surgical History:  Procedure Laterality Date  . BALLOON DILATION N/A 05/22/2015   Procedure: BALLOON DILATION;  Surgeon: Gatha Mayer, MD;  Location: [facility] ENDOSCOPY;  Service: Endoscopy;  Laterality: N/A;  . CARDIAC CATHETERIZATION  02/24/2002   reduced LV function, 60-70% prox RCA stenosis, 70% PLA ostial stenosis, 80% secondary branch of PLA stenosis - subsequent CABG (Dr. Jackie Plum)  . Carotid Doppler  06/2012   50-69% right bulb/prox ICA diameter reduction; 70-99% left bulb/prox ICA diameter reduction  . CATARACT EXTRACTION W/ INTRAOCULAR LENS IMPLANT Bilateral 2013  . COLONOSCOPY N/A 05/18/2016   Procedure: COLONOSCOPY;  Surgeon: Manus Gunning, MD;  Location[facility] ENDOSCOPY;  Service: Gastroenterology;  Laterality: N/A;  . CORONARY ANGIOPLASTY  07/05/2006   3 vessel CAD, patent LIMA to LAD, patentVG to OM, patent SVG to PDA & PLA, mild MR, severe LV systolic dysfunction (Dr. Gerrie Nordmann)  . CORONARY ARTERY BYPASS GRAFT  03/01/2002   LIMA to LAD, reverse SVG to OM, reverse SVG to PDA of RCA, reverse SVG to PLA of RCA, ligation of LA appendage (Dr. Servando Snare)  . ENTEROSCOPY N/A 05/20/2016   Procedure: ENTEROSCOPY;  Surgeon: Manus Gunning, MD;  Location: [place];  Service: Gastroenterology;  Laterality: N/A;  . ESOPHAGOGASTRODUODENOSCOPY  02/01/2012   Procedure: ESOPHAGOGASTRODUODENOSCOPY  (EGD);  Surgeon: Beryle Beams, MD;  Location: [facility];  Service: Endoscopy;  Laterality: N/A;  . ESOPHAGOGASTRODUODENOSCOPY N/A 05/22/2015   Procedure: ESOPHAGOGASTRODUODENOSCOPY (EGD);  Surgeon: Gatha Mayer, MD;  Location: [facility];  Service: Endoscopy;  Laterality: N/A;  . ESOPHAGOGASTRODUODENOSCOPY N/A 05/17/2016   Procedure: ESOPHAGOGASTRODUODENOSCOPY (EGD);  Surgeon: Juanita Craver, MD;  Location: [facility] ENDOSCOPY;  Service: Endoscopy;  Laterality: N/A;  . ESOPHAGOGASTRODUODENOSCOPY N/A 09/17/2017   Procedure: ESOPHAGOGASTRODUODENOSCOPY (EGD);  Surgeon: Milus Banister, MD;  Location: [facility] ENDOSCOPY;  Service: Endoscopy;  Laterality: N/A;  . FOREIGN BODY REMOVAL  09/17/2017   Procedure: FOREIGN BODY REMOVAL;  Surgeon: Milus Banister, MD;  Location: [place];  Service: Endoscopy;;  . GIVENS CAPSULE STUDY N/A 05/18/2016   Procedure: GIVENS CAPSULE STUDY;  Surgeon: Manus Gunning, MD;  Location: [facility];  Service: Gastroenterology;  Laterality:  N/A;  . LAPAROSCOPIC APPENDECTOMY  03/30/2011   Procedure: APPENDECTOMY LAPAROSCOPIC;  Surgeon: Joyice Faster. Cornett, MD;  Location: [place];  Service: General;  Laterality: N/A;  . LEFT HEART CATHETERIZATION WITH CORONARY ANGIOGRAM N/A 01/24/2013   Procedure: LEFT HEART CATHETERIZATION WITH CORONARY ANGIOGRAM;  Surgeon: [name], MD;  Location: [facility] CATH LAB;  Service: Cardiovascular;  Laterality: N/A;  Right heart with grafts  . LITHOTRIPSY     "once" (01/21/2013)  . NM MYOCAR PERF WALL MOTION  05/2009   persantine myoview - fixed moderate perfusion defect in inferior wall & lateral segment of apex (poor non-transmural infarction), minimal anterolateral periinfarct reversible ischemia seen, abnormal study, defects similar to 2006 study  . RIGHT/LEFT HEART CATH AND CORONARY ANGIOGRAPHY N/A 09/17/2017   Procedure: RIGHT/LEFT HEART CATH AND CORONARY ANGIOGRAPHY;  Surgeon: Martinique, Peter M, MD;  Location: [facility] CV LAB;  Service:  Cardiovascular;  Laterality: N/A;  . SPLENECTOMY, TOTAL  01/2013  . TEE WITHOUT CARDIOVERSION N/A 01/23/2013   Procedure: TRANSESOPHAGEAL ECHOCARDIOGRAM (TEE);  Surgeon: Sanda Klein, MD;  Location: [facility] ENDOSCOPY;  Service: Cardiovascular;  Laterality: N/A;  . TRANSTHORACIC ECHOCARDIOGRAM  06/23/2012   EF 40-45%, mild LVH; mild AV regurg; mild MV regurg; LV mod-severely dilated; RV mildly dilated; systolic pressure borderline increased; RA mod dilated    Social History   Tobacco Use  . Smoking status: Former Smoker    Packs/day: 2.00    Years: 20.00    Pack years: 40.00    Types: Cigarettes    Last attempt to quit: 04/08/1986    Years since quitting: 32.3  . Smokeless tobacco: Current User    Types: Chew  Substance Use Topics  . Alcohol use: No    Alcohol/week: 0.0 standard drinks  . Drug use: No    Family History  Problem Relation Age of Onset  . Heart disease Father        rheumatic fever  . Heart attack Brother 16  . Stroke Mother   . Stroke Brother 38    Allergies  Allergen Reactions  . Percocet [Oxycodone-Acetaminophen] Itching and Nausea Only    Medication list has been reviewed and updated.  Current Outpatient Medications on File Prior to Visit  Medication Sig Dispense Refill  . apixaban (ELIQUIS) 2.5 MG TABS tablet TAKE 1 TABLET(2.5 MG) BY MOUTH TWICE DAILY 180 tablet 2  . carvedilol (COREG) 3.125 MG tablet TAKE 1 TABLET(3.125 MG) BY MOUTH TWICE DAILY WITH A MEAL 180 tablet 3  . cephALEXin (KEFLEX) 500 MG capsule TK ONE C PO QID    . fluticasone (FLONASE) 50 MCG/ACT nasal spray Place 2 sprays into both nostrils daily.    . furosemide (LASIX) 40 MG tablet Take 1 tablet (40 mg total) by mouth daily. 90 tablet 3  . guaiFENesin (MUCINEX) 600 MG 12 hr tablet Take 600 mg by mouth 2 (two) times daily as needed for cough or to loosen phlegm.    Marland Kitchen ipratropium (ATROVENT) 0.03 % nasal spray Place 2 sprays into the nose 4 (four) times daily. (Patient taking differently:  Place 2 sprays into the nose as needed. ) 30 mL 6  . lisinopril (PRINIVIL,ZESTRIL) 5 MG tablet Take 1 tablet (5 mg total) by mouth daily. 90 tablet 3  . pantoprazole (PROTONIX) 40 MG tablet Take 1 tablet (40 mg total) by mouth 2 (two) times daily. 180 tablet 3  . rOPINIRole (REQUIP) 0.25 MG tablet TAKE 1 TABLET(0.25 MG) BY MOUTH TWICE DAILY 180 tablet 3  . rosuvastatin (  CRESTOR) 40 MG tablet TAKE 1 TABLET(40 MG) BY MOUTH DAILY 90 tablet 3   No current facility-administered medications on file prior to visit.     Review of Systems:  As per HPI- otherwise negative. No fever  Physical Examination: There were no vitals filed for this visit. There were no vitals filed for this visit. There is no height or weight on file to calculate BMI. Ideal Body Weight:    BP today was 104/64, pulse 69, temp 97.1 wieght 173  Wt Readings from Last 3 Encounters:  07/22/18 172 lb (78 kg)  05/18/18 188 lb 3.2 oz (85.4 kg)  01/13/18 182 lb (82.6 kg)   BP Readings from Last 3 Encounters:  07/22/18 137/62  05/18/18 106/70  01/13/18 115/63   Pulse Readings from Last 3 Encounters:  07/22/18 73  05/18/18 66  01/13/18 65    Spoke with patient on the phone today.  He sounds well, no cough or wheezing.  We are not able to make video work today  Assessment and Plan: Diarrhea, unspecified type - Plan: C. difficile GDH and Toxin A/B, Stool culture, CANCELED: CBC, CANCELED: Comprehensive metabolic panel  Virtual visit today for 5 days of diarrhea.  Don did take antibiotics recently, and also ate a suspicious food.  His symptoms are improving, though he still has diarrhea Unfortunately due to diarrhea being a possible symptom of COVID, I cannot bring him into the office today for labs.  I suggested that he be seen in the ER, where he can have lab work and IV fluids if needed.  However Gregory [name] states that he absolutely does not want to go to the ER.  In that case, I ordered a C. difficile and stool culture for  him, his daughter will pick up a stool kit for him to collect at home.  This will at least help [place] ensure he does not have C. difficile, so we could consider a stronger antimotility agent.  For the time being I do think over-the-counter Imodium is reasonable.  And I encouraged him to drink plenty of fluids and eat as he is able  Patient and his daughter do understand that if he is getting worse, the only course of action is for him to visit the emergency department.  We hope that this would not be necessary  I spoke to this patient for approximately 13 minutes on the telephone  Signed Lamar Blinks, MD

## 2018-07-25 NOTE — Addendum Note (Signed)
Addended by: Anabel Halon on: 07/25/2018 09:15 AM   Modules accepted: Orders

## 2018-07-25 NOTE — Telephone Encounter (Signed)
Copied from [place] [id number]. Topic: General - Other >> Jul 25, 2018  8:24 AM Oneta Rack wrote: Caller name: [phone] Relation to pt: daughter  Call back number: [phone] Pharmacy:  Reason for call:  Patient virtual visit with Percell Miller and and diarrhea did not improve, daughter states Percell Miller advised lab work today between the hours of 9a thru 10am and states a shot would be given, please advise

## 2018-07-26 ENCOUNTER — Telehealth: Payer: Self-pay

## 2018-07-26 NOTE — Telephone Encounter (Signed)
Great news, thanks.

## 2018-07-26 NOTE — Telephone Encounter (Signed)
Opened in error

## 2018-07-26 NOTE — Telephone Encounter (Signed)
Copied from [place] [id number]. Topic: General - Other >> Jul 26, 2018  3:17 PM Rainey Pines A wrote: Patients daughter called back in to let Dr. Lorelei Pont know that the patients diarrhea has went away and the patient is doing much better.

## 2018-08-29 ENCOUNTER — Other Ambulatory Visit: Payer: Self-pay | Admitting: Physician Assistant

## 2018-08-29 DIAGNOSIS — G2581 Restless legs syndrome: Secondary | ICD-10-CM

## 2018-09-12 ENCOUNTER — Telehealth: Payer: Self-pay | Admitting: Family Medicine

## 2018-09-12 DIAGNOSIS — G2581 Restless legs syndrome: Secondary | ICD-10-CM

## 2018-09-12 MED ORDER — ROPINIROLE HCL 0.25 MG PO TABS: ORAL_TABLET | ORAL | 3 refills | Status: DC

## 2018-09-12 MED ORDER — TRAMADOL HCL 50 MG PO TABS: ORAL_TABLET | ORAL | 1 refills | Status: DC

## 2018-09-12 NOTE — Telephone Encounter (Signed)
Medication Refill - Medication: rOPINIRole (REQUIP) 0.25 MG tablet  Pt also states he has taken tramadol in the past for back pain and would like a refill for it if possible, please advise.   Has the patient contacted their pharmacy? Yes.     Preferred Pharmacy: [facility] [place], [place] [facility] [place] [phone] (Phone) [phone] (Fax)   Pt was advised that RX refills may take up to 3 business days. We ask that you follow-up with your pharmacy.

## 2018-09-12 NOTE — Telephone Encounter (Signed)
Please advise 

## 2018-09-12 NOTE — Telephone Encounter (Signed)
Ok done

## 2018-09-21 DIAGNOSIS — N183 Chronic kidney disease, stage 3 (moderate): Secondary | ICD-10-CM | POA: Diagnosis not present

## 2018-09-28 DIAGNOSIS — N183 Chronic kidney disease, stage 3 (moderate): Secondary | ICD-10-CM | POA: Diagnosis not present

## 2018-09-28 DIAGNOSIS — I129 Hypertensive chronic kidney disease with stage 1 through stage 4 chronic kidney disease, or unspecified chronic kidney disease: Secondary | ICD-10-CM | POA: Diagnosis not present

## 2018-09-28 DIAGNOSIS — N2581 Secondary hyperparathyroidism of renal origin: Secondary | ICD-10-CM | POA: Diagnosis not present

## 2018-09-28 DIAGNOSIS — D631 Anemia in chronic kidney disease: Secondary | ICD-10-CM | POA: Diagnosis not present

## 2018-09-28 DIAGNOSIS — N200 Calculus of kidney: Secondary | ICD-10-CM | POA: Diagnosis not present

## 2018-11-30 ENCOUNTER — Encounter: Payer: Self-pay | Admitting: Cardiovascular Disease

## 2018-11-30 ENCOUNTER — Ambulatory Visit (INDEPENDENT_AMBULATORY_CARE_PROVIDER_SITE_OTHER): Payer: Medicare Other | Admitting: Cardiovascular Disease

## 2018-11-30 ENCOUNTER — Other Ambulatory Visit: Payer: Self-pay

## 2018-11-30 VITALS — BP 119/60 | HR 54 | Temp 96.3°F | Ht 66.5 in | Wt 180.0 lb

## 2018-11-30 DIAGNOSIS — I255 Ischemic cardiomyopathy: Secondary | ICD-10-CM

## 2018-11-30 DIAGNOSIS — I6523 Occlusion and stenosis of bilateral carotid arteries: Secondary | ICD-10-CM

## 2018-11-30 DIAGNOSIS — I4821 Permanent atrial fibrillation: Secondary | ICD-10-CM | POA: Diagnosis not present

## 2018-11-30 DIAGNOSIS — I25810 Atherosclerosis of coronary artery bypass graft(s) without angina pectoris: Secondary | ICD-10-CM

## 2018-11-30 DIAGNOSIS — Z951 Presence of aortocoronary bypass graft: Secondary | ICD-10-CM | POA: Diagnosis not present

## 2018-11-30 DIAGNOSIS — N184 Chronic kidney disease, stage 4 (severe): Secondary | ICD-10-CM

## 2018-11-30 DIAGNOSIS — Z7901 Long term (current) use of anticoagulants: Secondary | ICD-10-CM | POA: Diagnosis not present

## 2018-11-30 DIAGNOSIS — E785 Hyperlipidemia, unspecified: Secondary | ICD-10-CM

## 2018-11-30 MED ORDER — ROSUVASTATIN CALCIUM 40 MG PO TABS: ORAL_TABLET | ORAL | 1 refills | Status: DC

## 2018-11-30 MED ORDER — LISINOPRIL 5 MG PO TABS: 5.0000 mg | ORAL_TABLET | Freq: Every day | ORAL | 1 refills | Status: DC

## 2018-11-30 MED ORDER — CARVEDILOL 3.125 MG PO TABS: ORAL_TABLET | ORAL | 1 refills | Status: DC

## 2018-11-30 MED ORDER — APIXABAN 2.5 MG PO TABS: ORAL_TABLET | ORAL | 1 refills | Status: DC

## 2018-11-30 NOTE — Progress Notes (Signed)
Patient ID: YSIDRO RAMSAY, male   DOB: 12-22-1932, 85 y.o.   MRN: [id number]     HPI: ORAZIO WELLER is a 85 y.o. male returns for a 6 followup cardiology evaluation.  Mr. Maland has a history of known CAD in 2003 underwent CABG revascularization surgery by Dr. Servando Snare  (LIMA  to  LAD, vein graft to the obtuse marginal, sequential vein graft to the PDA and PLA of RCA).  His last cardiac catheterization was in 2008 done by Dr. Tami Ribas. At that time, ejection fraction was 20-25%. He declined consideration for ICD. Ejection fraction improved to approximately 35-40% in September 2012 on medical therapy. In the past, he has refused Coumadin therapy and had been maintained on aspirin and Plavix. In 2013 he was hospitalized for appendicitis with abscess requiring laparoscopic appendectomy. He was hospitalized in November 2014 and was felt to have a splenic infarct of embolic etiology. He underwent a transesophageal echocardiogram on 01/23/2013 by Dr. Sallyanne Kuster which revealed an ejection fraction in the range of 15-25%. There was diffuse hypokinesis with severe hypokinesis to akinesis of the anteroseptal apical myocardium. He had increased left ventricular end-diastolic filling pressure and elevated left atrial filling pressure. There was evidence for large solid fixed thrombus occupying the entire atrial appendage with moderate continuous spontaneous echo contrast ("smoke") in the left atrial cavity. There was no evidence for right atrial thrombus. At that time, the patient refused life vest.  He has been on Xarelto for anticoagulation.He denies chest pain. He denies significant shortness of breath. He is unaware of tachdysrhythmias.   He has permanent atrial fibrillation, but his rate has been controlled.  He denies any presyncope or syncope.  He denies any chest pain.  A followup echo Doppler study on 06/14/2013 revealed significant improvement in LV function with an ejection fraction estimated now at 50-55%  without regional wall motion abnormalities.  He did have mild aortic root dilatation, mild aortic insufficiency, and mild mitral regurgitation with biatrial enlargement.   He has severe bilateral carotid artery disease.  He had been seen by Dr. Oneida Alar.  His prior carotid duplex scan was significantly abnormal with his left carotid velocities increased to 400 systolic and 867 diastolic with severe fibrous plaque and elevated velocities within all segments of his left internal carotid.  There also was 50-69% diameter reduction in the right bulb/proximal ICA. I referred him to Dr. Gwenlyn Found who subsequently referred him to Dr. Trula Slade.  He denies chest pain.  He denies palpitations.  He denies shortness of breath.  He continues to say he is asymptomatic with reference to his carotid disease and has refused intervention.   A follow-up Doppler study on 08/23/2014 showed increased velocities in the left ar  492 over 148 at the MICA level and to 30/67, at the PICA level.  On the right, his PICA velocity was 292/78.  When compared to his prior study.  This has not increased and has remained fairly stable.  He denies chest pain or palpitations.  He denies paresthesias.  He denies visual symptoms.  When I saw him in follow-up,  he remained asymptomatic.  He has refused to consider any potential carotid intervention or surgery.  He was unaware of any fast heartbeats with his chronic atrial fibrillation.  He denies bleeding.  A carotid study earlier today continued to demonstrate severe stenosis with peak  velocity in the left MICA at 500/122 and PICA 241/34. On the right,PICA was 293/106.  At his last office visit, I recommended  discontinuing of simvastatin in attempt to be more aggressive with lipid lowering and started Crestor 40 mg daily.  He was hospitalized May 2017  after sustaining a renal infarct with acute kidney injury after taking 3 days off Xarelto for pain management injection to his back.  He had developed  mild anemia with a hemoglobin drop of 3 points leading to a subsequent emergency room evaluation.  A CT of his abdomen and pelvis did not show acute findings or signs of peritoneal bleeding.  He denied any episodes of chest pain,  paresthesias, presyncope or syncope. Or bleeding  An echo Doppler study in May 2017 showed an EF of 55-60%.  There was mild aortic insufficiency, mild aortic root dilatation, mitral annular calcification with mild MR.   When I later saw him in the office, had begun to consider the possibility of maybe in the future thinking about carotid surgery, but he was not ready yet to have this done.  His last carotid study in September 2017 showed left carotid peak systolic velocity of 865 and peak diastolic velocity 784, consistent with a least 80% stenosis.  On the right peak systolic velocity was 696 and diastolic velocity was 85.  He is followed by Dr. Posey Pronto for his chronic kidney disease.  When I  saw him in July 2018 he was feeling well and denied any episodes of chest pain or shortness of breath.  He denied any neurologic symptoms.  He denied paresthesias or dizziness.  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 At that time he was still against consideration for carotid surgery.  He was on aggressive lipid-lowering therapy with target LDL less than 70 and potential induce plaque regression.  He was seen by Almyra Deforest, PAC in June 2019.  He was on Eliquis without bleeding he was hospitalized from July 2 through July 6 with acute decompensated heart failure complicated by acute hypoxic respiratory failure.  An echo Doppler study showed an EF of 25 to 30% with diffuse hypokinesis which had significantly decreased from 50 to 60% in 2017.  He underwent repeat catheterization which showed occlusion of his native vessels with a patent LIMA to the LAD, a patent vein graft was on 2, and subchondral vein graft to the PDA/PLA.  He was seen by Lamar Blinks in follow-up on September 29, 2017.  At that time he was feeling well and denied significant shortness of breath and was on lisinopril 5 mg, furosemide 40 mg daily in addition to carvedilol 3.125 mg twice a day.  He is anticoagulated on Eliquis 2.5 mg twice a day.  He is on rosuvastatin 40 mg.  For restless legs he also has been on Requip.   I saw him in August 2019.  At that time he was doing well and I discussed potential transition to [facility] depending upon improvement in his renal function.  He is followed by Dr. Posey Pronto for his kidney disease.  Creatinine on December 01, 2017 was 2.46.  He denies recurrent chest pain or bleeding.  He is followed by Dr. Oneida Alar since continues to have no interest in pursuing carotid revascularization.    I last saw him in March 2020 at which time he was continuing to do well and remains very active.    He sees Dr. Posey Pronto who follows his chronic kidney disease.    Since I saw him, he he denies any episodes of dizziness paresthesias continues to remain active.  In April he cut  his finger on a table saw.  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  He feels that he has not had any reduction in his energy level and denies any exertional shortness of breath or chest pain.  He presents for evaluation.  Past Medical History:  Diagnosis Date   Acute respiratory failure ([place])    Appendicitis with abscess 03/31/2011   Arthritis    CAD (coronary artery disease),CABG 1993-LIMA to the LAD, SVG to Om and SVG to PDA/PLA, patent on cath 2008. 2003   a. s/p CABG 2003. b. last cath 2008 with patent grafts.   Chronic atrial fibrillation    permanent   Chronic systolic CHF (congestive heart failure) ([place])    a. Prior low EF, later normalized.   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 07/2015 - after holding Xarelto x 3 days for spinal procedure.   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 felt vasovagal in setting of pain from renal infarct.    Past Surgical History:  Procedure Laterality Date   BALLOON DILATION N/A 05/22/2015   Procedure: BALLOON DILATION;  Surgeon: Gatha Mayer, MD;  Location: [facility] ENDOSCOPY;  Service: Endoscopy;  Laterality: N/A;   CARDIAC CATHETERIZATION  02/24/2002   reduced LV function, 60-70% prox RCA stenosis, 70% PLA ostial stenosis, 80% secondary branch of PLA stenosis - subsequent CABG (Dr. Jackie Plum)   Carotid Doppler  06/2012   50-69% right bulb/prox ICA diameter reduction; 70-99% left bulb/prox ICA diameter reduction   CATARACT EXTRACTION W/ INTRAOCULAR LENS IMPLANT Bilateral 2013   COLONOSCOPY N/A 05/18/2016   Procedure: COLONOSCOPY;  Surgeon: Manus Gunning, MD;  Location: [facility] ENDOSCOPY;  Service: Gastroenterology;  Laterality: N/A;   CORONARY ANGIOPLASTY  07/05/2006   3 vessel CAD, patent LIMA to LAD, patentVG to OM, patent SVG to PDA & PLA, mild MR, severe LV systolic dysfunction (Dr. Gerrie Nordmann)   CORONARY ARTERY BYPASS GRAFT  03/01/2002   LIMA to LAD, reverse SVG to OM, reverse SVG to PDA of RCA, reverse SVG to PLA of RCA, ligation of LA appendage (Dr. Servando Snare)   ENTEROSCOPY N/A 05/20/2016   Procedure: ENTEROSCOPY;  Surgeon: Manus Gunning, MD;  Location: [place];  Service: Gastroenterology;  Laterality: N/A;   ESOPHAGOGASTRODUODENOSCOPY  02/01/2012   Procedure: ESOPHAGOGASTRODUODENOSCOPY (EGD);  Surgeon: Beryle Beams, MD;  Location: [facility] END[facility];  Service: Endoscopy;  Laterality: N/A;   ESOPHAGOGASTRODUODENOSCOPY N/A 05/22/2015   Procedure: ESOPHAGOGASTRODUODENOSCOPY (EGD);  Surgeon: Gatha Mayer, MD;   Location: [facility] ENDOSCOPY;  Service: Endoscopy;  Laterality: N/A;   ESOPHAGOGASTRODUODENOSCOPY N/A 05/17/2016   Procedure: ESOPHAGOGASTRODUODENOSCOPY (EGD);  Surgeon: Juanita Craver, MD;  Location: [facility] ENDOSCOPY;  Service: Endoscopy;  Laterality: N/A;   ESOPHAGOGASTRODUODENOSCOPY N/A 09/17/2017   Procedure: ESOPHAGOGASTRODUODENOSCOPY (EGD);  Surgeon: Milus Banister, MD;  Location[facility] ENDOSCOPY;  Service: Endoscopy;  Laterality: N/A;   FOREIGN BODY REMOVAL  09/17/2017   Procedure: FOREIGN BODY REMOVAL;  Surgeon: Milus Banister, MD;  Location: [place];  Service: Endoscopy;;   GIVENS CAPSULE STUDY N/A 05/18/2016   Procedure: GIVENS CAPSULE STUDY;  Surgeon: Manus Gunning, MD;  Location: [place];  Service: Gastroenterology;  Laterality: N/A;   LAPAROSCOPIC APPENDECTOMY  03/30/2011   Procedure: APPENDECTOMY LAPAROSCOPIC;  Surgeon: Joyice Faster. Cornett, MD;  Location: [place];  Service: General;  Laterality: N/A;   LEFT HEART CATHETERIZATION WITH CORONARY ANGIOGRAM N/A 01/24/2013   Procedure: LEFT HEART CATHETERIZATION WITH CORONARY ANGIOGRAM;  Surgeon: [name], MD;  Location: [facility] CATH LAB;  Service: Cardiovascular;  Laterality: N/A;  Right heart with grafts   LITHOTRIPSY     "once" (01/21/2013)   [place]  05/2009   persantine myoview - fixed moderate perfusion defect in inferior wall & lateral segment of apex (poor non-transmural infarction), minimal anterolateral periinfarct reversible ischemia seen, abnormal study, defects similar to 2006 study   RIGHT/LEFT HEART CATH AND CORONARY ANGIOGRAPHY N/A 09/17/2017   Procedure: RIGHT/LEFT HEART CATH AND CORONARY ANGIOGRAPHY;  Surgeon: Martinique, Peter M, MD;  Location: [place] CV LAB;  Service: Cardiovascular;  Laterality: N/A;   SPLENECTOMY, TOTAL  01/2013   TEE WITHOUT CARDIOVERSION N/A 01/23/2013   Procedure: TRANSESOPHAGEAL ECHOCARDIOGRAM (TEE);  Surgeon: Sanda Klein, MD;  Location: [facility] ENDOSCOPY;  Service:  Cardiovascular;  Laterality: N/A;   TRANSTHORACIC ECHOCARDIOGRAM  06/23/2012   EF 40-45%, mild LVH; mild AV regurg; mild MV regurg; LV mod-severely dilated; RV mildly dilated; systolic pressure borderline increased; RA mod dilated    Allergies  Allergen Reactions   Percocet [Oxycodone-Acetaminophen] Itching and Nausea Only    Current Outpatient Medications  Medication Sig Dispense Refill   apixaban (ELIQUIS) 2.5 MG TABS tablet TAKE 1 TABLET(2.5 MG) BY MOUTH TWICE DAILY 180 tablet 1   carvedilol (COREG) 3.125 MG tablet TAKE 1 TABLET(3.125 MG) BY MOUTH TWICE DAILY WITH A MEAL 180 tablet 1   furosemide (LASIX) 40 MG tablet Take 1 tablet (40 mg total) by mouth daily. (Patient taking differently: Take 20 mg by mouth daily. ) 90 tablet 3   lisinopril (ZESTRIL) 5 MG tablet Take 1 tablet (5 mg total) by mouth daily. 90 tablet 1   pantoprazole (PROTONIX) 40 MG tablet Take 1 tablet (40 mg total) by mouth 2 (two) times daily. 180 tablet 3   rOPINIRole (REQUIP) 0.25 MG tablet TAKE 1 TABLET(0.25 MG) BY MOUTH TWICE DAILY 180 tablet 3   rosuvastatin (CRESTOR) 40 MG tablet TAKE 1 TABLET(40 MG) BY MOUTH DAILY 90 tablet 1   traMADol (ULTRAM) 50 MG tablet TAKE ONE-HALF TABLET BY MOUTH EVERY 8 HOURS AS NEEDED FOR PAIN 30 tablet 1   fluticasone (FLONASE) 50 MCG/ACT nasal spray Place 2 sprays into both nostrils daily.     guaiFENesin (MUCINEX) 600 MG 12 hr tablet Take 600 mg by mouth 2 (two) times daily as needed for cough or to loosen phlegm.     ipratropium (ATROVENT) 0.03 % nasal spray Place 2 sprays into the nose 4 (four) times daily. (Patient not taking: Reported on 11/30/2018) 30 mL 6   No current facility-administered medications for this visit.     Social History   Socioeconomic History   Marital status: Married    Spouse name: Not on file   Number of children: 9   Years of education: 8   Highest education level: Not on file  Occupational History   Occupation: retired  Child psychotherapist strain: Not on file   Food insecurity    Worry: Not on file  Inability: Not on file   Transportation needs    Medical: Not on file    Non-medical: Not on file  Tobacco Use   Smoking status: Former Smoker    Packs/day: 2.00    Years: 20.00    Pack years: 40.00    Types: Cigarettes    Quit date: 04/08/1986    Years since quitting: 32.6   Smokeless tobacco: Current User    Types: Chew  Substance and Sexual Activity   Alcohol use: No    Alcohol/week: 0.0 standard drinks   Drug use: No   Sexual activity: Not on file  Lifestyle   Physical activity    Days per week: Not on file    Minutes per session: Not on file   Stress: Not on file  Relationships   Social connections    Talks on phone: Not on file    Gets together: Not on file    Attends religious service: Not on file    Active member of club or organization: Not on file    Attends meetings of clubs or organizations: Not on file    Relationship status: Not on file   Intimate partner violence    Fear of current or ex partner: Not on file    Emotionally abused: Not on file    Physically abused: Not on file    Forced sexual activity: Not on file  Other Topics Concern   Not on file  Social History Narrative   A she is married, 5 sons 4 daughters. He is retired Horticulturist, commercial. 2-3 caffeinated beverages a day no alcohol    Family History  Problem Relation Age of Onset   Heart disease Father        rheumatic fever   Heart attack Brother 60   Stroke Mother    Stroke Brother 72   ROS General: Negative; No fevers, chills, or night sweats;  HEENT: Negative; No changes in vision or hearing, sinus congestion, difficulty swallowing Pulmonary: Negative; No cough, wheezing, shortness of breath, hemoptysis Cardiovascular: See history of present illness GI: positive for GERD; No nausea, vomiting, diarrhea, or abdominal pain GU: Renal infarct Musculoskeletal: Negative; no myalgias,  joint pain, or weakness Hematologic/Oncology: Negative; no easy bruising, bleeding Endocrine: Negative; no heat/cold intolerance; no diabetes Neuro: positive for remote CVA Skin: Negative; No rashes or skin lesions Psychiatric: Negative; No behavioral problems, depression Sleep: Positive for restless legs; no snoring, daytime sleepiness, hypersomnolence, bruxism, , hypnogognic hallucinations, no cataplexy Other comprehensive 14 point system review is negative.   PE BP 119/60    Pulse (!) 54    Temp (!) 96.3 F (35.7 C)    Ht 5' 6.5" (1.689 m)    Wt 180 lb (81.6 kg)    SpO2 99%    BMI 28.62 kg/m    Repeat blood pressure by me 120/68  Wt Readings from Last 3 Encounters:  11/30/18 180 lb (81.6 kg)  07/22/18 172 lb (78 kg)  05/18/18 188 lb 3.2 oz (85.4 kg)   General: Alert, oriented, no distress.  Skin: normal turgor, no rashes, warm and dry HEENT: Normocephalic, atraumatic. Pupils equal round and reactive to light; sclera anicteric; extraocular muscles intact;  Nose without nasal septal hypertrophy Mouth/Parynx benign; Mallinpatti scale 3 Neck: No JVD, no carotid bruits; normal carotid upstroke Lungs: clear to ausculatation and percussion; no wheezing or rales Chest wall: without tenderness to palpitation Heart: PMI not displaced, RRR, s1 s2 normal, 1/6 systolic murmur, no diastolic murmur, no rubs,  gallops, thrills, or heaves Abdomen: soft, nontender; no hepatosplenomehaly, BS+; abdominal aorta nontender and not dilated by palpation. Back: no CVA tenderness Pulses 2+ Musculoskeletal: full range of motion, normal strength, no joint deformities Extremities: no clubbing cyanosis or edema, Homan's sign negative  Neurologic: grossly nonfocal; Cranial nerves grossly wnl Psychologic: Normal mood and affect   ECG (independently read by me): Atrial fibrillation with ventricular rate at 50 bpm.  QS complex V1 through V4  March 2020 ECG (independently read by me): Atrial fibrillation at  66 bpm.  Right axis deviation.  Poor anterior R wave progression V1 through V5.  DC interval for 36 ms.  January 13, 2018 ECG (independently read by me): Atrial fibrillation at 65 bpm.  Inferior Q waves.  Poor anterior R wave progression V1 through V4  August 2019 ECG (independently read by me): AF vs junctional at 76; old anterolateral MI; QTc 497 msec  July 2018 ECG (independently read by me): Atrial fibrillation at 60 bpm.  Low voltage.  Probable old anterolateral MI with poor anterior R-wave progression.  Small inferior Q waves.  December 2017 ECG (independently read by me): Atrial fibrillation with a rate at 58 bpm.  QS complex anteriorly.  Low voltage.  September 2017 ECG (independently read by me): Atrial fibrillation with premature ventricular beats.  Poor R wave progression anteriorly.  Small nondiagnostic inferior Q waves.  December 2016 ECG (independently read by me): atrial fibrillation with rate in the 60s with occasional PVC.  QTc interval 452 ms  August 2016 ECG (independently read by me): Atrial fibrillation at 67 bpm.  2 ventricular premature beats.  Poor R-wave progression  May 2016 ECG (independently read by me): Atrial fibrillation with occasional PVCs.  Heart rate 72.  ECG (independently read by me): Atrial fibrillation with controlled ventricular response in the 70s.  Poor anterior R-wave progression.  Prior April 2015 ECG (independently read by me): Atrial fibrillation at 56 beats per minute.  QTc interval 395 ms  Prior 02/23/2013 ECG: Atrial fibrillation at 80 beats per minute with isolated PVC.  LABS: BMP Latest Ref Rng & Units 05/18/2018 01/13/2018 12/01/2017  Glucose 65 - 99 mg/dL 81 138(H) 112(H)  BUN 8 - 27 mg/dL 20 21 32(H)  Creatinine 0.76 - 1.27 mg/dL 1.74(H) 1.75(H) 2.46(H)  BUN/Creat Ratio 10 - '24 11 12 '$ -  Sodium 134 - 144 mmol/L 144 141 140  Potassium 3.5 - 5.2 mmol/L 4.6 4.5 4.1  Chloride 96 - 106 mmol/L 103 102 101  CO2 20 - 29 mmol/L '24 25 30   '$ Calcium 8.6 - 10.2 mg/dL 9.4 8.7 9.6   Hepatic Function Latest Ref Rng & Units 05/18/2018 12/01/2017 09/14/2017  Total Protein 6.0 - 8.5 g/dL 7.0 7.3 7.1  Albumin 3.6 - 4.6 g/dL 3.8 3.8 3.3(L)  AST 0 - 40 IU/L '23 18 26  '$ ALT 0 - 44 IU/L '15 11 15  '$ Alk Phosphatase 39 - 117 IU/L 94 80 83  Total Bilirubin 0.0 - 1.2 mg/dL 0.4 0.5 1.2  Bilirubin, Direct 0.0 - 0.3 mg/dL - - -   CBC Latest Ref Rng & Units 05/18/2018 12/01/2017 09/18/2017  WBC 3.4 - 10.8 x10E3/uL 6.2 7.7 7.1  Hemoglobin 13.0 - 17.7 g/dL 12.6(L) 12.1(L) 10.5(L)  Hematocrit 37.5 - 51.0 % 36.9(L) 35.2(L) 33.0(L)  Platelets 150 - 450 x10E3/uL 186 205.0 196   Lab Results  Component Value Date   MCV 90 05/18/2018   MCV 92.3 12/01/2017   MCV 99.7 09/18/2017   Lab Results  Component Value Date   TSH 5.990 (H) 05/18/2018   Lab Results  Component Value Date   HGBA1C 6.1 (H) 05/18/2018   Lipid Panel     Component Value Date/Time   CHOL 136 05/18/2018 1355   TRIG 57 05/18/2018 1355   HDL 56 05/18/2018 1355   CHOLHDL 2.4 05/18/2018 1355   CHOLHDL 2.8 11/27/2015 0830   VLDL 19 11/27/2015 0830   LDLCALC 69 05/18/2018 1355     IMPRESSION:  1. Coronary artery disease involving coronary bypass graft of native heart without angina pectoris   2. Hx of CABG   3. Permanent atrial fibrillation   4. Anticoagulation adequate   5. Bilateral carotid artery occlusion   6. Ischemic cardiomyopathy   7. Hyperlipidemia with target LDL less than 70   8. CKD (chronic kidney disease), stage IV [facility])     ASSESSMENT AND PLAN: Mr. Jediah Horger is an 85 year old gentleman who is status post CABG revascularization surgery in 2003. He has a history of an  ischemic cardiomyopathy with an initial ejection fraction of 25% with subsequent normalization at 55-60%; however, on his last echo Doppler study during his July 2019 hospitalization EF was again 25 to 30%.  There was mild AR, mild to moderate MR, biatrial enlargement and mild TR.  He had akinesis  of the distal anteroseptal wall and apex.  He has a remote history of a small right brain CVA in 2005  with subsequent recovery. 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  In the past he was documented to have prior thrombus in his left atrium and had been on Xarelto.  Presently he is on a reduced dose of Eliquis in light of his age and renal dysfunction.  He has documented high-grade carotid disease for which he sees Dr. feels and has no interest in pursuing surgery or stenting.  He continues to be asymptomatic.  His blood pressure today is stable on lisinopril 5 mg, carvedilol 3.125 mg twice a day and furosemide 40 mg.  He is now on rosuvastatin 40 mg and an aggressive attempt to induce plaque regression with target LDL less than 70.  There is no bleeding on low-dose Eliquis.  I reviewed his most recent laboratory from 6 months ago.  At that time hemoglobin A1c was 6.1.  Creatinine had improved to 1.74.  Hemoglobin and hematocrit were stable.  Lipid studies were excellent with LDL cholesterol at 69 on his current regimen.  As long as he remains stable he will continue his current regimen and I will see him in 6 months for reevaluation.  Time spent: 25 minutes  Troy Sine, MD, [facility]  12/02/2018 7:22 PM

## 2018-11-30 NOTE — Patient Instructions (Addendum)
Follow-Up: You will need a follow up appointment in 6 months.  Please call our office 2 months in advance,DECEMBER 2020 to schedule this, MARCH 2021 appointment.  You may see Shelva Majestic, MD or one of the following Advanced Practice Providers on your designated Care Team:  Almyra Deforest, PA-C  Fabian Sharp, [place]       Medication Instructions:  The current medical regimen is effective;  continue present plan and medications as directed. Please refer to the Current Medication list given to you today.  The current medical regimen is effective;  continue present plan and medications as directed. Please refer to the Current Medication list given to you today.  If you need a refill on your cardiac medications before your next appointment, please call your pharmacy. Labwork: When you have labs (blood work) and your tests are completely normal, you will receive your results ONLY by [place] (if you have MyChart) -OR- A paper copy in the mail.  At [facility], you and your health needs are our priority.  As part of our continuing mission to provide you with exceptional heart care, we have created designated Provider Care Teams.  These Care Teams include your primary Cardiologist (physician) and Advanced Practice Providers (APPs -  Physician Assistants and Nurse Practitioners) who all work together to provide you with the care you need, when you need it.  Thank you for choosing [facility] at [facility]!!

## 2018-12-02 ENCOUNTER — Encounter: Payer: Self-pay | Admitting: Cardiovascular Disease

## 2018-12-04 DIAGNOSIS — R7303 Prediabetes: Secondary | ICD-10-CM | POA: Insufficient documentation

## 2018-12-04 NOTE — Progress Notes (Addendum)
[facility] [address], [place], [place] [address] [phone] [id number]  Date:  12/07/2018   Name:  Gregory [name]   DOB:  [birth year]   MRN:  [id number]  PCP:  Gregory Mclean, MD    Chief Complaint: Atrial Fibrillation (6 month follow up)   History of Present Illness:  Gregory [name] is a 85 y.o. very pleasant male patient who presents with the following:  Medically complex elderly gentleman here today for follow-up visit Last seen by myself in May of this year for virtual visit Gregory [name] is married to Gregory [name], daughter Gregory [name] is quite involved in his care and very helpful  History of A. fib, CAD status post CABG in 2003, cardiomyopathy, hypertension, carotid artery stenosis, restless leg syndrome, renal insufficiency, CVA 2005 His nephrologist is Dr. Marlin [name] recent visit in July.  Reviewed his note from that visit.  Pt had a renal infarct in 2017 after he held his xarelto for a spine procedure.  He now has CKD III with associated HTN, chronic anemia, secondary hyperparathyroidism  Cardiologist is Dr. Claiborne [name]  He saw Dr. Claiborne [name] last week:  1. Coronary artery disease involving coronary bypass graft of native heart without angina pectoris   2. Hx of CABG   3. Permanent atrial fibrillation   4. Anticoagulation adequate   5. Bilateral carotid artery occlusion   6. Ischemic cardiomyopathy   7. Hyperlipidemia with target LDL less than 70   8. CKD (chronic kidney disease), stage IV Gregory [name])     ASSESSMENT AND PLAN: Gregory [name] is an 85 year old gentleman who is status post CABG revascularization surgery in 2003. He has a history of an  ischemic cardiomyopathy with an initial ejection fraction of 25% with subsequent normalization at 55-60%; however, on his last echo Doppler study during his July 2019 hospitalization EF was again 25 to 30%.  There was mild AR, mild to moderate MR, biatrial enlargement and mild TR.  He had  akinesis of the distal anteroseptal wall and apex.  He has a remote history of a small right brain CVA in 2005  with subsequent recovery. 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  In the past he was documented to have prior thrombus in his left atrium and had been on Xarelto.  Presently he is on a reduced dose of Eliquis in light of his age and renal dysfunction.  He has documented high-grade carotid disease for which he sees Dr. feels and has no interest in pursuing surgery or stenting.  He continues to be asymptomatic.  His blood pressure today is stable on lisinopril 5 mg, carvedilol 3.125 mg twice a day and furosemide 40 mg.  He is now on rosuvastatin 40 mg and an aggressive attempt to induce plaque regression with target LDL less than 70.  There is no bleeding on low-dose Eliquis.  I reviewed his most recent laboratory from 6 months ago.  At that time hemoglobin A1c was 6.1.  Creatinine had improved to 1.74.  Hemoglobin and hematocrit were stable.  Lipid studies were excellent with LDL cholesterol at 69 on his current regimen.  As long as he remains stable he will continue his current regimen and I will see him in 6 months for reevaluation.   Needs flu shot- will give today  Can suggest Shingrix Pneumonia up-to-date Routine labs drawn in March- will send to Dr Gregory [name]  when results come in He is fasting today except for an orange  Patient Active Problem List   Diagnosis Date Noted  . Prediabetes 12/04/2018  . Esophageal obstruction due to food impaction   . Acute congestive heart failure ([place])   . Acute respiratory failure ([place]) 09/14/2017  . Acute on chronic systolic heart failure ([place])   . Fatigue 08/06/2017  . Symptomatic anemia   . Chronic anemia 01/13/2016  . Chronic renal failure, stage 3 (moderate) ([place]) 08/06/2015  . Vasovagal syncope   . Coronary artery disease involving native coronary artery   . Renal infarction ([place])  07/31/2015  . Syncope 07/30/2015  . Esophageal stricture   . Carotid stenosis 07/13/2013  . Anticoagulated on Eliquis 03/21/2013  . PVD - 16% LICA, 38% RICA by doppler 5/14 01/24/2013  . 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 CAD (coronary artery disease),CABG 1993-LIMA to the LAD, SVG to Om and SVG to PDA/PLA, patent on cath 2014. 03/31/2011  . Chronic a-fib 03/31/2011  . Cardiomyopathy, ischemic, improved 03/31/2011  . CVA (cerebral vascular accident) ([place]) 03/31/2011  . GERD (gastroesophageal reflux disease) 03/31/2011  . Renal calculi 03/31/2011  . History of tobacco use, continues with chewing tobacco 03/31/2011    Past Medical History:  Diagnosis Date  . Acute respiratory failure ([place])   . Appendicitis with abscess 03/31/2011  . Arthritis   . CAD (coronary artery disease),CABG 1993-LIMA to the LAD, SVG to Om and SVG to PDA/PLA, patent on cath 2008. 2003   a. s/p CABG 2003. b. last cath 2008 with patent grafts.  . Chronic atrial fibrillation    permanent  . Chronic systolic CHF (congestive heart failure) ([place])    a. Prior low EF, later normalized.  . Essential hypertension   . GERD (gastroesophageal reflux disease)   . Heart murmur   . Hyperlipidemia   . Ischemic cardiomyopathy   . Kidney stones    "passed all but one time when he had to have lithotripsy" (01/21/2013)  . Pulmonary hypertension ([place])   . Renal infarct ([place])    a. 07/2015 - after holding Xarelto x 3 days for spinal procedure.  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 SSS (sick sinus syndrome) ([place])   . Stroke [facility]) 2007   "slightly drags left foot since; recovered qthing else" (01/21/2013)  . Syncope 07/2015   a. felt vasovagal in setting of pain from renal infarct.    Past Surgical History:  Procedure Laterality Date  . BALLOON DILATION N/A 05/22/2015   Procedure: BALLOON DILATION;  Surgeon: Gatha Mayer, MD;  Location: [facility] ENDOSCOPY;  Service: Endoscopy;   Laterality: N/A;  . CARDIAC CATHETERIZATION  02/24/2002   reduced LV function, 60-70% prox RCA stenosis, 70% PLA ostial stenosis, 80% secondary branch of PLA stenosis - subsequent CABG (Dr. Jackie Plum)  . Carotid Doppler  06/2012   50-69% right bulb/prox ICA diameter reduction; 70-99% left bulb/prox ICA diameter reduction  . CATARACT EXTRACTION W/ INTRAOCULAR LENS IMPLANT Bilateral 2013  . COLONOSCOPY N/A 05/18/2016   Procedure: COLONOSCOPY;  Surgeon: Manus Gunning, MD;  Location: [name];  Service: Gastroenterology;  Laterality: N/A;  . CORONARY ANGIOPLASTY  07/05/2006   3 vessel CAD, patent LIMA to LAD, patentVG to OM, patent SVG to PDA & PLA, mild MR, severe LV systolic dysfunction (Dr. Gerrie Nordmann)  . CORONARY ARTERY BYPASS GRAFT  03/01/2002   LIMA to LAD, reverse SVG to OM, reverse SVG to PDA  of RCA, reverse SVG to PLA of RCA, ligation of LA appendage (Dr. Servando Snare)  . ENTEROSCOPY N/A 05/20/2016   Procedure: ENTEROSCOPY;  Surgeon: Manus Gunning, MD;  Location: [place];  Service: Gastroenterology;  Laterality: N/A;  . ESOPHAGOGASTRODUODENOSCOPY  02/01/2012   Procedure: ESOPHAGOGASTRODUODENOSCOPY (EGD);  Surgeon: Beryle Beams, MD;  Location: [facility];  Service: Endoscopy;  Laterality: N/A;  . ESOPHAGOGASTRODUODENOSCOPY N/A 05/22/2015   Procedure: ESOPHAGOGASTRODUODENOSCOPY (EGD);  Surgeon: Gatha Mayer, MD;  Location: [facility];  Service: Endoscopy;  Laterality: N/A;  . ESOPHAGOGASTRODUODENOSCOPY N/A 05/17/2016   Procedure: ESOPHAGOGASTRODUODENOSCOPY (EGD);  Surgeon: Juanita Craver, MD;  Location: [name];  Service: Endoscopy;  Laterality: N/A;  . ESOPHAGOGASTRODUODENOSCOPY N/A 09/17/2017   Procedure: ESOPHAGOGASTRODUODENOSCOPY (EGD);  Surgeon: Milus Banister, MD;  Location: [facility]OSCOPY;  Service: Endoscopy;  Laterality: N/A;  . FOREIGN BODY REMOVAL  09/17/2017   Procedure: FOREIGN BODY REMOVAL;  Surgeon: Milus Banister, MD;  Location: [facility];  Service:  Endoscopy;;  . GIVENS CAPSULE STUDY N/A 05/18/2016   Procedure: GIVENS CAPSULE STUDY;  Surgeon: Manus Gunning, MD;  Location: [place];  Service: Gastroenterology;  Laterality: N/A;  . LAPAROSCOPIC APPENDECTOMY  03/30/2011   Procedure: APPENDECTOMY LAPAROSCOPIC;  Surgeon: Joyice Faster. Cornett, MD;  Location: [place];  Service: General;  Laterality: N/A;  . LEFT HEART CATHETERIZATION WITH CORONARY ANGIOGRAM N/A 01/24/2013   Procedure: LEFT HEART CATHETERIZATION WITH CORONARY ANGIOGRAM;  Surgeon: [name], MD;  Location: [name];  Service: Cardiovascular;  Laterality: N/A;  Right heart with grafts  . LITHOTRIPSY     "once" (01/21/2013)  . NM MYOCAR PERF WALL MOTION  05/2009   persantine myoview - fixed moderate perfusion defect in inferior wall & lateral segment of apex (poor non-transmural infarction), minimal anterolateral periinfarct reversible ischemia seen, abnormal study, defects similar to 2006 study  . RIGHT/LEFT HEART CATH AND CORONARY ANGIOGRAPHY N/A 09/17/2017   Procedure: RIGHT/LEFT HEART CATH AND CORONARY ANGIOGRAPHY;  Surgeon: Martinique, Peter M, MD;  Location: [facility] CV LAB;  Service: Cardiovascular;  Laterality: N/A;  . SPLENECTOMY, TOTAL  01/2013  . TEE WITHOUT CARDIOVERSION N/A 01/23/2013   Procedure: TRANSESOPHAGEAL ECHOCARDIOGRAM (TEE);  Surgeon: Sanda Klein, MD;  Location: [facility] ENDOSCOPY;  Service: Cardiovascular;  Laterality: N/A;  . TRANSTHORACIC ECHOCARDIOGRAM  06/23/2012   EF 40-45%, mild LVH; mild AV regurg; mild MV regurg; LV mod-severely dilated; RV mildly dilated; systolic pressure borderline increased; RA mod dilated    Social History   Tobacco Use  . Smoking status: Former Smoker    Packs/day: 2.00    Years: 20.00    Pack years: 40.00    Types: Cigarettes    Quit date: 04/08/1986    Years since quitting: 32.6  . Smokeless tobacco: Current User    Types: Chew  Substance Use Topics  . Alcohol use: No    Alcohol/week: 0.0 standard drinks  .  Drug use: No    Family History  Problem Relation Age of Onset  . Heart disease Father        rheumatic fever  . Heart attack Brother 7  . Stroke Mother   . Stroke Brother 74    Allergies  Allergen Reactions  . Percocet [Oxycodone-Acetaminophen] Itching and Nausea Only    Medication list has been reviewed and updated.  Current Outpatient Medications on File Prior to Visit  Medication Sig Dispense Refill  . apixaban (ELIQUIS) 2.5 MG TABS tablet TAKE 1 TABLET(2.5 MG) BY MOUTH TWICE DAILY 180 tablet 1  .  carvedilol (COREG) 3.125 MG tablet TAKE 1 TABLET(3.125 MG) BY MOUTH TWICE DAILY WITH A MEAL 180 tablet 1  . lisinopril (ZESTRIL) 5 MG tablet Take 1 tablet (5 mg total) by mouth daily. 90 tablet 1  . pantoprazole (PROTONIX) 40 MG tablet Take 1 tablet (40 mg total) by mouth 2 (two) times daily. 180 tablet 3  . rOPINIRole (REQUIP) 0.25 MG tablet TAKE 1 TABLET(0.25 MG) BY MOUTH TWICE DAILY 180 tablet 3  . rosuvastatin (CRESTOR) 40 MG tablet TAKE 1 TABLET(40 MG) BY MOUTH DAILY 90 tablet 1  . traMADol (ULTRAM) 50 MG tablet TAKE ONE-HALF TABLET BY MOUTH EVERY 8 HOURS AS NEEDED FOR PAIN 30 tablet 1   No current facility-administered medications on file prior to visit.     Review of Systems:  As per HPI- otherwise negative. They do follow his home BP- 116-120/ about 70  Wt Readings from Last 3 Encounters:  12/07/18 183 lb (83 kg)  11/30/18 180 lb (81.6 kg)  07/22/18 172 lb (78 kg)     Physical Examination: Vitals:   12/07/18 1313  BP: 128/80  Pulse: 75  Resp: 16  Temp: (!) 97.4 F (36.3 C)  SpO2: 96%   Vitals:   12/07/18 1313  Weight: 183 lb (83 kg)  Height: 5' 6.5" (1.689 m)   Body mass index is 29.09 kg/m. Ideal Body Weight: Weight in (lb) to have BMI = 25: 156.9  GEN: WDWN, NAD, Non-toxic, A & O x 3, overweight, looks well  HEENT: Atraumatic, Normocephalic. Neck supple. No masses, No LAD. Ears and Nose: No external deformity. CV: Rate controlled A. fib, No  M/G/R. No JVD. No thrill. No extra heart sounds. PULM: CTA B, no wheezes, crackles, rhonchi. No retractions. No resp. distress. No accessory muscle use. ABD: S, NT, ND, +BS. No rebound. No HSM. EXTR: No c/c/e NEURO Normal gait.  PSYCH: Normally interactive. Conversant. Not depressed or anxious appearing.  Calm demeanor.    Assessment and Plan: Persistent atrial fibrillation - Plan: CBC, Basic metabolic panel  Anticoagulated on Eliquis  Chronic kidney disease, unspecified CKD stage  Need for influenza vaccination  Coronary artery disease involving coronary bypass graft of native heart without angina pectoris  Abnormal TSH - Plan: TSH  Prediabetes - Plan: Hemoglobin A1c   Gregory [name] is here for follow-up visit today, accompanied by his daughter who contributes to history He continues to be in A. fib, takes Eliquis for anticoagulation He is under the care of cardiology and nephrology Flu shot given today We will recheck his TSH and A1c today He would like a copy of his labs sent to Dr. Claiborne [name] when they come in Encouraged shingles vaccine, which can be given at the pharmacy  Signed Lamar Blinks, MD  Received his labs 9/28- called as we need to treat hypothyroidism  Mild anemia stable Renal function stable  a1c ok   Call but had to leave message.  I will send a letter as well.  Advised that I am going to send in medication to treat underactive thyroid gland to his drugstore if you would please start on this at his convenience  Results for orders placed or performed in visit on 12/07/18  CBC  Result Value Ref Range   WBC 7.1 4.0 - 10.5 K/uL   RBC 3.70 (L) 4.22 - 5.81 Mil/uL   Platelets 180.0 150.0 - 400.0 K/uL   Hemoglobin 12.0 (L) 13.0 - 17.0 g/dL   HCT 35.7 (L) 39.0 - 52.0 %   MCV  96.5 78.0 - 100.0 fl   MCHC 33.6 30.0 - 36.0 g/dL   RDW 15.0 11.5 - 53.2 %  Basic metabolic panel  Result Value Ref Range   Sodium 140 135 - 145 mEq/L   Potassium 4.2 3.5 - 5.1 mEq/L    Chloride 106 96 - 112 mEq/L   CO2 26 19 - 32 mEq/L   Glucose, Bld 90 70 - 99 mg/dL   BUN 28 (H) 6 - 23 mg/dL   Creatinine, Ser 1.82 (H) 0.40 - 1.50 mg/dL   Calcium 8.8 8.4 - 10.5 mg/dL   GFR 35.61 (L) >60.00 mL/min  Hemoglobin A1c  Result Value Ref Range   Hgb A1c MFr Bld 6.5 4.6 - 6.5 %  TSH  Result Value Ref Range   TSH 7.85 (H) 0.35 - 4.50 uIU/mL

## 2018-12-07 ENCOUNTER — Encounter: Payer: Self-pay | Admitting: Family Medicine

## 2018-12-07 ENCOUNTER — Other Ambulatory Visit: Payer: Self-pay

## 2018-12-07 ENCOUNTER — Ambulatory Visit (INDEPENDENT_AMBULATORY_CARE_PROVIDER_SITE_OTHER): Payer: Medicare Other | Admitting: Family Medicine

## 2018-12-07 VITALS — BP 128/80 | HR 75 | Temp 97.4°F | Resp 16 | Ht 66.5 in | Wt 183.0 lb

## 2018-12-07 DIAGNOSIS — Z7901 Long term (current) use of anticoagulants: Secondary | ICD-10-CM

## 2018-12-07 DIAGNOSIS — R946 Abnormal results of thyroid function studies: Secondary | ICD-10-CM

## 2018-12-07 DIAGNOSIS — N189 Chronic kidney disease, unspecified: Secondary | ICD-10-CM | POA: Diagnosis not present

## 2018-12-07 DIAGNOSIS — I25810 Atherosclerosis of coronary artery bypass graft(s) without angina pectoris: Secondary | ICD-10-CM | POA: Diagnosis not present

## 2018-12-07 DIAGNOSIS — R7989 Other specified abnormal findings of blood chemistry: Secondary | ICD-10-CM | POA: Diagnosis not present

## 2018-12-07 DIAGNOSIS — Z23 Encounter for immunization: Secondary | ICD-10-CM | POA: Diagnosis not present

## 2018-12-07 DIAGNOSIS — R7303 Prediabetes: Secondary | ICD-10-CM

## 2018-12-07 DIAGNOSIS — I4819 Other persistent atrial fibrillation: Secondary | ICD-10-CM

## 2018-12-07 DIAGNOSIS — E039 Hypothyroidism, unspecified: Secondary | ICD-10-CM

## 2018-12-07 NOTE — Patient Instructions (Addendum)
Great to see you again today! I will be in touch with your labs and will send a copy to Dr Claiborne Billings as well You can get the shingles vaccine at your drug store if you like- this is a 2 shot series that you can do at your convenience  I will be in touch with your labs Let me know when you need more tramadol Recheck in 6 months

## 2018-12-08 LAB — CBC
HCT: 35.7 % — ABNORMAL LOW (ref 39.0–52.0)
Hemoglobin: 12 g/dL — ABNORMAL LOW (ref 13.0–17.0)
MCHC: 33.6 g/dL (ref 30.0–36.0)
MCV: 96.5 fl (ref 78.0–100.0)
Platelets: 180 10*3/uL (ref 150.0–400.0)
RBC: 3.7 Mil/uL — ABNORMAL LOW (ref 4.22–5.81)
RDW: 15 % (ref 11.5–15.5)
WBC: 7.1 10*3/uL (ref 4.0–10.5)

## 2018-12-08 LAB — BASIC METABOLIC PANEL
BUN: 28 mg/dL — ABNORMAL HIGH (ref 6–23)
CO2: 26 mEq/L (ref 19–32)
Calcium: 8.8 mg/dL (ref 8.4–10.5)
Chloride: 106 mEq/L (ref 96–112)
Creatinine, Ser: 1.82 mg/dL — ABNORMAL HIGH (ref 0.40–1.50)
GFR: 35.61 mL/min — ABNORMAL LOW (ref >60.00)
Glucose, Bld: 90 mg/dL (ref 70–99)
Potassium: 4.2 mEq/L (ref 3.5–5.1)
Sodium: 140 mEq/L (ref 135–145)

## 2018-12-08 LAB — TSH: TSH: 7.85 u[IU]/mL — ABNORMAL HIGH (ref 0.35–4.50)

## 2018-12-08 LAB — HEMOGLOBIN A1C: Hgb A1c MFr Bld: 6.5 % (ref 4.6–6.5)

## 2018-12-12 ENCOUNTER — Other Ambulatory Visit: Payer: Self-pay | Admitting: Family Medicine

## 2018-12-12 DIAGNOSIS — E039 Hypothyroidism, unspecified: Secondary | ICD-10-CM | POA: Insufficient documentation

## 2018-12-12 MED ORDER — LEVOTHYROXINE SODIUM 25 MCG PO TABS: 25.0000 ug | ORAL_TABLET | Freq: Every day | ORAL | 6 refills | Status: DC

## 2018-12-12 NOTE — Addendum Note (Signed)
Addended by: Lamar Blinks C on: 12/12/2018 11:52 AM   Modules accepted: Orders

## 2018-12-14 ENCOUNTER — Telehealth: Payer: Self-pay

## 2018-12-14 NOTE — Telephone Encounter (Signed)
Left message for daughter to call back so I can give further information-did leave detailed message however ill be happy to go over it again!

## 2018-12-14 NOTE — Telephone Encounter (Signed)
Copied from [place] [id number]. Topic: General - Inquiry >> Dec 13, 2018  3:07 PM Berneta Levins wrote: Reason for CRM:  Pt's daughter, [name] calling.  States that pt was told that he had a thyroid problem and that medication was going to be called in for him.  Pt wants to know what type of a problem he has and exactly what the medication is going to do before he starts it.

## 2018-12-15 ENCOUNTER — Emergency Department (HOSPITAL_COMMUNITY)
Admission: EM | Admit: 2018-12-15 | Discharge: 2018-12-15 | Disposition: A | Discharge status: Home / Self Care | Payer: Medicare Other | Attending: Emergency Medicine | Admitting: Emergency Medicine

## 2018-12-15 ENCOUNTER — Encounter (HOSPITAL_COMMUNITY): Payer: Self-pay | Admitting: Anesthesiology

## 2018-12-15 ENCOUNTER — Encounter (HOSPITAL_COMMUNITY): Payer: Self-pay | Admitting: Emergency Medicine

## 2018-12-15 ENCOUNTER — Other Ambulatory Visit: Payer: Self-pay

## 2018-12-15 ENCOUNTER — Encounter (HOSPITAL_COMMUNITY): Payer: Self-pay

## 2018-12-15 ENCOUNTER — Telehealth: Payer: Self-pay | Admitting: Gastroenterology

## 2018-12-15 ENCOUNTER — Ambulatory Visit (HOSPITAL_COMMUNITY): Admit: 2018-12-15 | Payer: Medicare Other | Admitting: Internal Medicine

## 2018-12-15 ENCOUNTER — Encounter (HOSPITAL_COMMUNITY)
Admission: EM | Disposition: A | Discharge status: Home / Self Care | Payer: Self-pay | Source: Home / Self Care | Attending: Emergency Medicine

## 2018-12-15 DIAGNOSIS — Z03818 Encounter for observation for suspected exposure to other biological agents ruled out: Secondary | ICD-10-CM | POA: Diagnosis not present

## 2018-12-15 DIAGNOSIS — I255 Ischemic cardiomyopathy: Secondary | ICD-10-CM | POA: Insufficient documentation

## 2018-12-15 DIAGNOSIS — Z951 Presence of aortocoronary bypass graft: Secondary | ICD-10-CM | POA: Insufficient documentation

## 2018-12-15 DIAGNOSIS — I5022 Chronic systolic (congestive) heart failure: Secondary | ICD-10-CM | POA: Insufficient documentation

## 2018-12-15 DIAGNOSIS — I2510 Atherosclerotic heart disease of native coronary artery without angina pectoris: Secondary | ICD-10-CM | POA: Insufficient documentation

## 2018-12-15 DIAGNOSIS — Z79899 Other long term (current) drug therapy: Secondary | ICD-10-CM | POA: Diagnosis not present

## 2018-12-15 DIAGNOSIS — N183 Chronic kidney disease, stage 3 unspecified: Secondary | ICD-10-CM | POA: Diagnosis not present

## 2018-12-15 DIAGNOSIS — T18120A Food in esophagus causing compression of trachea, initial encounter: Secondary | ICD-10-CM | POA: Diagnosis not present

## 2018-12-15 DIAGNOSIS — R7303 Prediabetes: Secondary | ICD-10-CM | POA: Diagnosis not present

## 2018-12-15 DIAGNOSIS — T18128A Food in esophagus causing other injury, initial encounter: Secondary | ICD-10-CM | POA: Insufficient documentation

## 2018-12-15 DIAGNOSIS — Z8249 Family history of ischemic heart disease and other diseases of the circulatory system: Secondary | ICD-10-CM | POA: Diagnosis not present

## 2018-12-15 DIAGNOSIS — K219 Gastro-esophageal reflux disease without esophagitis: Secondary | ICD-10-CM | POA: Diagnosis not present

## 2018-12-15 DIAGNOSIS — Z87442 Personal history of urinary calculi: Secondary | ICD-10-CM | POA: Diagnosis not present

## 2018-12-15 DIAGNOSIS — F17210 Nicotine dependence, cigarettes, uncomplicated: Secondary | ICD-10-CM | POA: Insufficient documentation

## 2018-12-15 DIAGNOSIS — Z885 Allergy status to narcotic agent status: Secondary | ICD-10-CM | POA: Insufficient documentation

## 2018-12-15 DIAGNOSIS — E785 Hyperlipidemia, unspecified: Secondary | ICD-10-CM | POA: Diagnosis not present

## 2018-12-15 DIAGNOSIS — Z7901 Long term (current) use of anticoagulants: Secondary | ICD-10-CM | POA: Insufficient documentation

## 2018-12-15 DIAGNOSIS — K222 Esophageal obstruction: Secondary | ICD-10-CM

## 2018-12-15 DIAGNOSIS — Z8673 Personal history of transient ischemic attack (TIA), and cerebral infarction without residual deficits: Secondary | ICD-10-CM | POA: Diagnosis not present

## 2018-12-15 DIAGNOSIS — X58XXXA Exposure to other specified factors, initial encounter: Secondary | ICD-10-CM | POA: Diagnosis not present

## 2018-12-15 DIAGNOSIS — Z20828 Contact with and (suspected) exposure to other viral communicable diseases: Secondary | ICD-10-CM | POA: Insufficient documentation

## 2018-12-15 DIAGNOSIS — I130 Hypertensive heart and chronic kidney disease with heart failure and stage 1 through stage 4 chronic kidney disease, or unspecified chronic kidney disease: Secondary | ICD-10-CM | POA: Insufficient documentation

## 2018-12-15 DIAGNOSIS — E039 Hypothyroidism, unspecified: Secondary | ICD-10-CM | POA: Insufficient documentation

## 2018-12-15 DIAGNOSIS — I482 Chronic atrial fibrillation, unspecified: Secondary | ICD-10-CM | POA: Diagnosis not present

## 2018-12-15 HISTORY — PX: FOREIGN BODY REMOVAL: SHX962

## 2018-12-15 HISTORY — PX: BALLOON DILATION: SHX5330

## 2018-12-15 HISTORY — PX: ESOPHAGOGASTRODUODENOSCOPY (EGD) WITH PROPOFOL: SHX5813

## 2018-12-15 LAB — SARS CORONAVIRUS 2 BY RT PCR (HOSPITAL ORDER, PERFORMED IN ~~LOC~~ HOSPITAL LAB): SARS Coronavirus 2: NEGATIVE

## 2018-12-15 SURGERY — ESOPHAGOGASTRODUODENOSCOPY (EGD) WITH PROPOFOL
Anesthesia: Monitor Anesthesia Care

## 2018-12-15 SURGERY — EGD (ESOPHAGOGASTRODUODENOSCOPY)
Anesthesia: Moderate Sedation

## 2018-12-15 MED ORDER — MIDAZOLAM HCL (PF) 5 MG/ML IJ SOLN
INTRAMUSCULAR | Status: AC
  Filled 2018-12-15: qty 2

## 2018-12-15 MED ORDER — FENTANYL CITRATE (PF) 100 MCG/2ML IJ SOLN
INTRAMUSCULAR | Status: AC
  Filled 2018-12-15: qty 2

## 2018-12-15 MED ORDER — FENTANYL CITRATE (PF) 100 MCG/2ML IJ SOLN
INTRAMUSCULAR | Status: DC | PRN
  Administered 2018-12-15 (×2): 25 ug via INTRAVENOUS

## 2018-12-15 MED ORDER — SODIUM CHLORIDE 0.9 % IV SOLN: INTRAVENOUS | Status: DC

## 2018-12-15 MED ORDER — MIDAZOLAM HCL (PF) 10 MG/2ML IJ SOLN
INTRAMUSCULAR | Status: DC | PRN
  Administered 2018-12-15: 1 mg via INTRAVENOUS
  Administered 2018-12-15: 2 mg via INTRAVENOUS
  Administered 2018-12-15: 1 mg via INTRAVENOUS

## 2018-12-15 MED ORDER — BUTAMBEN-TETRACAINE-BENZOCAINE 2-2-14 % EX AERO: INHALATION_SPRAY | CUTANEOUS | Status: DC | PRN

## 2018-12-15 MED ORDER — LACTATED RINGERS IV SOLN
INTRAVENOUS | Status: AC | PRN
  Administered 2018-12-15: 1000 mL via INTRAVENOUS

## 2018-12-15 SURGICAL SUPPLY — 15 items

## 2018-12-15 NOTE — H&P (Signed)
[facility] History and Physical   Primary Care Physician:  Darreld Mclean, MD   Reason for Procedure:   remove impacted food  Plan:    Upper endoscopy and removal of food impaction     HPI: Gregory [name] is a 85 y.o. male with hx GERD and stricture who ahas had country ham impacted in esophagus by hx since last night. Refused to come to ED yesterday - here this AM. Unable to swallow. Some neck pressure.   Past Medical History:  Diagnosis Date  . Acute respiratory failure ([place])   . Appendicitis with abscess 03/31/2011  . Arthritis   . CAD (coronary artery disease),CABG 1993-LIMA to the LAD, SVG to Om and SVG to PDA/PLA, patent on cath 2008. 2003   a. s/p CABG 2003. b. last cath 2008 with patent grafts.  . Chronic atrial fibrillation (HCC)    permanent  . Chronic systolic CHF (congestive heart failure) ([place])    a. Prior low EF, later normalized.  . Essential hypertension   . GERD (gastroesophageal reflux disease)   . Heart murmur   . Hyperlipidemia   . Ischemic cardiomyopathy   . Kidney stones    "passed all but one time when he had to have lithotripsy" (01/21/2013)  . Pulmonary hypertension ([place])   . 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 07/2015 - after holding Xarelto x 3 days for spinal procedure.  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 SSS (sick sinus syndrome) ([place])   . Stroke [facility]) 2007   "slightly drags left foot since; recovered qthing else" (01/21/2013)  . Syncope 07/2015   a. felt vasovagal in setting of pain from renal infarct.    Past Surgical History:  Procedure Laterality Date  . BALLOON DILATION N/A 05/22/2015   Procedure: BALLOON DILATION;  Surgeon: Gatha Mayer, MD;  [facility]: [facility] ENDOSCOPY;  Service: Endoscopy;  Laterality: N/A;  . CARDIAC CATHETERIZATION  02/24/2002   reduced LV function, 60-70% prox RCA stenosis, 70% PLA ostial stenosis, 80% secondary branch of PLA stenosis - subsequent CABG (Dr. Jackie Plum)  . Carotid Doppler  06/2012   50-69% right  bulb/prox ICA diameter reduction; 70-99% left bulb/prox ICA diameter reduction  . CATARACT EXTRACTION W/ INTRAOCULAR LENS IMPLANT Bilateral 2013  . COLONOSCOPY N/A 05/18/2016   Procedure: COLONOSCOPY;  Surgeon: Manus Gunning, MD;  Location: [facility] ENDOSCOPY;  Service: Gastroenterology;  Laterality: N/A;  . CORONARY ANGIOPLASTY  07/05/2006   3 vessel CAD, patent LIMA to LAD, patentVG to OM, patent SVG to PDA & PLA, mild MR, severe LV systolic dysfunction (Dr. Gerrie Nordmann)  . CORONARY ARTERY BYPASS GRAFT  03/01/2002   LIMA to LAD, reverse SVG to OM, reverse SVG to PDA of RCA, reverse SVG to PLA of RCA, ligation of LA appendage (Dr. Servando Snare)  . ENTEROSCOPY N/A 05/20/2016   Procedure: ENTEROSCOPY;  Surgeon: Manus Gunning, MD;  Location: [place];  Service: Gastroenterology;  Laterality: N/A;  . ESOPHAGOGASTRODUODENOSCOPY  02/01/2012   Procedure: ESOPHAGOGASTRODUODENOSCOPY (EGD);  Surgeon: Beryle Beams, MD;  Location: [facility];  Service: Endoscopy;  Laterality: N/A;  . ESOPHAGOGASTRODUODENOSCOPY N/A 05/22/2015   Procedure: ESOPHAGOGASTRODUODENOSCOPY (EGD);  Surgeon: Gatha Mayer, MD;  Location: [facility];  Service: Endoscopy;  Laterality: N/A;  . ESOPHAGOGASTRODUODENOSCOPY N/A 05/17/2016   Procedure: ESOPHAGOGASTRODUODENOSCOPY (EGD);  Surgeon: Juanita Craver, MD;  Location: [facility] ENDOSCOPY;  Service: Endoscopy;  Laterality: N/A;  . ESOPHAGOGASTRODUODENOSCOPY N/A 09/17/2017   Procedure: ESOPHAGOGASTRODUODENOSCOPY (EGD);  Surgeon: Milus Banister, MD;  Location:  [place] ENDOSCOPY;  Service: Endoscopy;  Laterality: N/A;  . FOREIGN BODY REMOVAL  09/17/2017   Procedure: FOREIGN BODY REMOVAL;  Surgeon: Milus Banister, MD;  Location: [place];  Service: Endoscopy;;  . GIVENS CAPSULE STUDY N/A 05/18/2016   Procedure: GIVENS CAPSULE STUDY;  Surgeon: Manus Gunning, MD;  Location: [place];  Service: Gastroenterology;  Laterality: N/A;  . LAPAROSCOPIC APPENDECTOMY  03/30/2011    Procedure: APPENDECTOMY LAPAROSCOPIC;  Surgeon: Joyice Faster. Cornett, MD;  Location: [place];  Service: General;  Laterality: N/A;  . LEFT HEART CATHETERIZATION WITH CORONARY ANGIOGRAM N/A 01/24/2013   Procedure: LEFT HEART CATHETERIZATION WITH CORONARY ANGIOGRAM;  Surgeon: [name], MD;  Location: [facility] CATH LAB;  Service: Cardiovascular;  Laterality: N/A;  Right heart with grafts  . LITHOTRIPSY     "once" (01/21/2013)  . NM MYOCAR PERF WALL MOTION  05/2009   persantine myoview - fixed moderate perfusion defect in inferior wall & lateral segment of apex (poor non-transmural infarction), minimal anterolateral periinfarct reversible ischemia seen, abnormal study, defects similar to 2006 study  . RIGHT/LEFT HEART CATH AND CORONARY ANGIOGRAPHY N/A 09/17/2017   Procedure: RIGHT/LEFT HEART CATH AND CORONARY ANGIOGRAPHY;  Surgeon: Martinique, Peter M, MD;  Location: [place] CV LAB;  Service: Cardiovascular;  Laterality: N/A;  . SPLENECTOMY, TOTAL  01/2013  . TEE WITHOUT CARDIOVERSION N/A 01/23/2013   Procedure: TRANSESOPHAGEAL ECHOCARDIOGRAM (TEE);  Surgeon: Sanda Klein, MD;  Location: [facility] ENDOSCOPY;  Service: Cardiovascular;  Laterality: N/A;  . TRANSTHORACIC ECHOCARDIOGRAM  06/23/2012   EF 40-45%, mild LVH; mild AV regurg; mild MV regurg; LV mod-severely dilated; RV mildly dilated; systolic pressure borderline increased; RA mod dilated    Prior to Admission medications   Medication Sig Start Date End Date Taking? Authorizing Provider  apixaban (ELIQUIS) 2.5 MG TABS tablet TAKE 1 TABLET(2.5 MG) BY MOUTH TWICE DAILY 11/30/18   Troy Sine, MD  carvedilol (COREG) 3.125 MG tablet TAKE 1 TABLET(3.125 MG) BY MOUTH TWICE DAILY WITH A MEAL 11/30/18   Troy Sine, MD  levothyroxine (SYNTHROID) 25 MCG tablet Take 1 tablet (25 mcg total) by mouth daily before breakfast. 12/12/18   [name], [name], MD  lisinopril (ZESTRIL) 5 MG tablet Take 1 tablet (5 mg total) by mouth daily. 11/30/18   Troy Sine, MD   pantoprazole (PROTONIX) 40 MG tablet Take 1 tablet (40 mg total) by mouth 2 (two) times daily. 03/24/18   Troy Sine, MD  rOPINIRole (REQUIP) 0.25 MG tablet TAKE 1 TABLET(0.25 MG) BY MOUTH TWICE DAILY 09/12/18   [name], [name], MD  rosuvastatin (CRESTOR) 40 MG tablet TAKE 1 TABLET(40 MG) BY MOUTH DAILY 11/30/18   Troy Sine, MD  traMADol (ULTRAM) 50 MG tablet TAKE ONE-HALF TABLET BY MOUTH EVERY 8 HOURS AS NEEDED FOR PAIN 09/12/18   [name], [name], MD    No current facility-administered medications for this encounter.    Current Outpatient Medications  Medication Sig Dispense Refill  . apixaban (ELIQUIS) 2.5 MG TABS tablet TAKE 1 TABLET(2.5 MG) BY MOUTH TWICE DAILY 180 tablet 1  . carvedilol (COREG) 3.125 MG tablet TAKE 1 TABLET(3.125 MG) BY MOUTH TWICE DAILY WITH A MEAL 180 tablet 1  . levothyroxine (SYNTHROID) 25 MCG tablet Take 1 tablet (25 mcg total) by mouth daily before breakfast. 30 tablet 6  . lisinopril (ZESTRIL) 5 MG tablet Take 1 tablet (5 mg total) by mouth daily. 90 tablet 1  . pantoprazole (PROTONIX) 40 MG tablet Take 1 tablet (40 mg  total) by mouth 2 (two) times daily. 180 tablet 3  . rOPINIRole (REQUIP) 0.25 MG tablet TAKE 1 TABLET(0.25 MG) BY MOUTH TWICE DAILY 180 tablet 3  . rosuvastatin (CRESTOR) 40 MG tablet TAKE 1 TABLET(40 MG) BY MOUTH DAILY 90 tablet 1  . traMADol (ULTRAM) 50 MG tablet TAKE ONE-HALF TABLET BY MOUTH EVERY 8 HOURS AS NEEDED FOR PAIN 30 tablet 1    Allergies as of 12/15/2018 - Review Complete 12/15/2018  Allergen Reaction Noted  . Percocet [oxycodone-acetaminophen] Itching and Nausea Only 04/09/2011    Family History  Problem Relation Age of Onset  . Heart disease Father        rheumatic fever  . Heart attack Brother 20  . Stroke Mother   . Stroke Brother 72    Social History   Socioeconomic History  . Marital status: Married    Spouse name: Not on file  . Number of children: 9  . Years of education: 8  . Highest education  level: Not on file  Occupational History  . Occupation: retired  Scientific laboratory technician  . Financial resource strain: Not on file  . Food insecurity    Worry: Not on file    Inability: Not on file  . Transportation needs    Medical: Not on file    Non-medical: Not on file  Tobacco Use  . Smoking status: Former Smoker    Packs/day: 2.00    Years: 20.00    Pack years: 40.00    Types: Cigarettes    Quit date: 04/08/1986    Years since quitting: 32.7  . Smokeless tobacco: Current User    Types: Chew  Substance and Sexual Activity  . Alcohol use: No    Alcohol/week: 0.0 standard drinks  . Drug use: No  . Sexual activity: Not on file  Lifestyle  . Physical activity    Days per week: Not on file    Minutes per session: Not on file  . Stress: Not on file  Relationships  . Social Herbalist on phone: Not on file    Gets together: Not on file    Attends religious service: Not on file    Active member of club or organization: Not on file    Attends meetings of clubs or organizations: Not on file    Relationship status: Not on file  . Intimate partner violence    Fear of current or ex partner: Not on file    Emotionally abused: Not on file    Physically abused: Not on file    Forced sexual activity: Not on file  Other Topics Concern  . Not on file  Social History Narrative   A she is married, 5 sons 4 daughters. He is retired Horticulturist, commercial. 2-3 caffeinated beverages a day no alcohol    Review of Systems:  All other review of systems negative except as mentioned in the HPI.  Physical Exam: Vital signs in last 24 hours: Temp:  [97.6 F (36.4 C)] 97.6 F (36.4 C) (10/01 0835) Pulse Rate:  [76] 76 (10/01 0835) Resp:  [17] 17 (10/01 0835) BP: (120)/(97) 120/97 (10/01 0835) SpO2:  [98 %] 98 % (10/01 0835) Weight:  [80.9 kg] 80.9 kg (10/01 0836)   General:   Alert,  Well-developed, well-nourished, pleasant and cooperative in NAD Lungs:  Clear throughout to auscultation.    Heart:  Regular rate and rhythm; no murmurs, clicks, rubs,  or gallops. Abdomen:  Soft, nontender and nondistended. Normal  bowel sounds.   Neuro/Psych:  Alert and cooperative. Normal mood and affect. A and O x 3   @Donja Tipping  Simonne Maffucci, MD, [facility] [phone] (pager) 12/15/2018 10:54 AM@

## 2018-12-15 NOTE — ED Provider Notes (Signed)
[facility] DEPT Provider Note   CSN: [id number] Arrival date & time: 12/15/18  9528     History   Chief Complaint Chief Complaint  Patient presents with   food stuck in throat    HPI DERRIK MCEACHERN is a 85 y.o. male.     The history is provided by the patient and medical records. No language interpreter was used.   RYOMA NOFZIGER is a 85 y.o. male who presents to the Emergency Department complaining of food stuck in throat.  He presents to the ED with the sensation that there is food stuck at his sternal notch.  This started around 8 am yesterday with breakfast.  He believes it is county ham.  He feels like some of it came up last night.  He is unable to swallow any liquid.  He has missed all his medications Tuesday night.  Denies any fevers, CP, SOB, covid 19 exposures.  He sees [facility].  Sxs are severe and constant in nature.   Past Medical History:  Diagnosis Date   Acute respiratory failure ([place])    Appendicitis with abscess 03/31/2011   Arthritis    CAD (coronary artery disease),CABG 1993-LIMA to the LAD, SVG to Om and SVG to PDA/PLA, patent on cath 2008. 2003   a. s/p CABG 2003. b. last cath 2008 with patent grafts.   Chronic atrial fibrillation (HCC)    permanent   Chronic systolic CHF (congestive heart failure) ([place])    a. Prior low EF, later normalized.   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 07/2015 - after holding Xarelto x 3 days for spinal procedure.   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 felt vasovagal in setting of pain from renal  infarct.    Patient Active Problem List   Diagnosis Date Noted   Hypothyroid 12/12/2018   Prediabetes 12/04/2018   Esophageal obstruction due to food impaction    Fatigue 08/06/2017   Symptomatic anemia    Chronic anemia 01/13/2016   Chronic renal failure, stage 3 (moderate) (HCC) 08/06/2015   Vasovagal syncope    Coronary artery disease involving native coronary artery    Renal infarction ([place]) 07/31/2015   Syncope 07/30/2015   Esophageal stricture    Carotid stenosis 07/13/2013   Anticoagulated on Eliquis 41/32/4401   PVD - 02% LICA, 72% RICA by doppler 5/14 01/24/2013   Thrombus of left atrial appendage on TEE 01/23/13 01/24/2013   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 03/31/2011   Chronic a-fib ([place]) 03/31/2011   Cardiomyopathy, ischemic, improved 03/31/2011   CVA (cerebral vascular accident) ([place]) 03/31/2011   GERD (gastroesophageal reflux disease) 03/31/2011   Renal calculi 03/31/2011   History of tobacco use, continues with chewing tobacco 03/31/2011    Past Surgical History:  Procedure Laterality Date   BALLOON DILATION N/A 05/22/2015   Procedure: BALLOON DILATION;  Surgeon: Gatha Mayer, MD;  [facility] ENDOSCOPY;  Service: Endoscopy;  Laterality: N/A;   CARDIAC CATHETERIZATION  02/24/2002   reduced LV function, 60-70% prox RCA stenosis, 70% PLA ostial stenosis, 80% secondary branch of PLA stenosis - subsequent CABG (Dr. Jackie Plum)   Carotid Doppler  06/2012   50-69% right bulb/prox ICA diameter reduction; 70-99% left bulb/prox ICA diameter reduction   CATARACT EXTRACTION W/ INTRAOCULAR LENS IMPLANT Bilateral 2013   COLONOSCOPY N/A 05/18/2016   Procedure: COLONOSCOPY;  Surgeon: Manus Gunning, MD;  [facility] ENDOSCOPY;  Service: Gastroenterology;  Laterality: N/A;   CORONARY ANGIOPLASTY  07/05/2006   3 vessel CAD, patent LIMA to  LAD, patentVG to OM, patent SVG to PDA & PLA, mild MR, severe LV systolic dysfunction (Dr. Gerrie Nordmann)   CORONARY ARTERY BYPASS GRAFT  03/01/2002   LIMA to LAD, reverse SVG to OM, reverse SVG to PDA of RCA, reverse SVG to PLA of RCA, ligation of LA appendage (Dr. Servando Snare)   ENTEROSCOPY N/A 05/20/2016   Procedure: ENTEROSCOPY;  Surgeon: Manus Gunning, MD;  Location: [place];  Service: Gastroenterology;  Laterality: N/A;   ESOPHAGOGASTRODUODENOSCOPY  02/01/2012   Procedure: ESOPHAGOGASTRODUODENOSCOPY (EGD);  Surgeon: Beryle Beams, MD;  Location[facility];  Service: Endoscopy;  Laterality: N/A;   ESOPHAGOGASTRODUODENOSCOPY N/A 05/22/2015   Procedure: ESOPHAGOGASTRODUODENOSCOPY (EGD);  Surgeon: Gatha Mayer, MD;  Location: [facility];  Service: Endoscopy;  Laterality: N/A;   ESOPHAGOGASTRODUODENOSCOPY N/A 05/17/2016   Procedure: ESOPHAGOGASTRODUODENOSCOPY (EGD);  Surgeon: Juanita Craver, MD;  Location: [facility] ENDOSCOPY;  Service: Endoscopy;  Laterality: N/A;   ESOPHAGOGASTRODUODENOSCOPY N/A 09/17/2017   Procedure: ESOPHAGOGASTRODUODENOSCOPY (EGD);  Surgeon: Milus Banister, MD;  Location: [facility] ENDOSCOPY;  Service: Endoscopy;  Laterality: N/A;   FOREIGN BODY REMOVAL  09/17/2017   Procedure: FOREIGN BODY REMOVAL;  Surgeon: Milus Banister, MD;  Location: [place];  Service: Endoscopy;;   GIVENS CAPSULE STUDY N/A 05/18/2016   Procedure: GIVENS CAPSULE STUDY;  Surgeon: Manus Gunning, MD;  Location: [place];  Service: Gastroenterology;  Laterality: N/A;   LAPAROSCOPIC APPENDECTOMY  03/30/2011   Procedure: APPENDECTOMY LAPAROSCOPIC;  Surgeon: Joyice Faster. Cornett, MD;  Location: [place];  Service: General;  Laterality: N/A;   LEFT HEART CATHETERIZATION WITH CORONARY ANGIOGRAM N/A 01/24/2013   Procedure: LEFT HEART CATHETERIZATION WITH CORONARY ANGIOGRAM;  Surgeon: [name], MD;  Location: [facility] CATH LAB;  Service: Cardiovascular;  Laterality: N/A;  Right heart with grafts     LITHOTRIPSY     "once" (01/21/2013)   [place]  05/2009   persantine myoview - fixed moderate perfusion defect in inferior wall & lateral segment of apex (poor non-transmural infarction), minimal anterolateral periinfarct reversible ischemia seen, abnormal study, defects similar to 2006 study   RIGHT/LEFT HEART CATH AND CORONARY ANGIOGRAPHY N/A 09/17/2017   Procedure: RIGHT/LEFT HEART CATH AND CORONARY ANGIOGRAPHY;  Surgeon: Martinique, Peter M, MD;  Location: [place] CV LAB;  Service: Cardiovascular;  Laterality: N/A;   SPLENECTOMY, TOTAL  01/2013   TEE WITHOUT CARDIOVERSION N/A 01/23/2013   Procedure: TRANSESOPHAGEAL ECHOCARDIOGRAM (TEE);  Surgeon: Sanda Klein, MD;  Location: [facility] ENDOSCOPY;  Service: Cardiovascular;  Laterality: N/A;   TRANSTHORACIC ECHOCARDIOGRAM  06/23/2012   EF 40-45%, mild LVH; mild AV regurg; mild MV regurg; LV mod-severely dilated; RV mildly dilated; systolic pressure borderline increased; RA mod dilated        Home Medications    Prior to Admission medications   Medication Sig Start Date End Date Taking? Authorizing Provider  apixaban (ELIQUIS) 2.5 MG TABS tablet  TAKE 1 TABLET(2.5 MG) BY MOUTH TWICE DAILY Patient taking differently: Take 2.5 mg by mouth 2 (two) times daily. TAKE 1 TABLET(2.5 MG) BY MOUTH TWICE DAILY 11/30/18  Yes Troy Sine, MD  carvedilol (COREG) 3.125 MG tablet TAKE 1 TABLET(3.125 MG) BY MOUTH TWICE DAILY WITH A MEAL Patient taking differently: Take 3.125 mg by mouth 2 (two) times daily with a meal. TAKE 1 TABLET(3.125 MG) BY MOUTH TWICE DAILY WITH A MEAL 11/30/18  Yes Troy Sine, MD  levothyroxine (SYNTHROID) 25 MCG tablet Take 1 tablet (25 mcg total) by mouth daily before breakfast. 12/12/18  Yes [name], [name], MD  lisinopril (ZESTRIL) 5 MG tablet Take 1 tablet (5 mg total) by mouth daily. 11/30/18  Yes Troy Sine, MD  pantoprazole (PROTONIX) 40 MG tablet Take 1 tablet (40 mg total) by mouth 2 (two) times  daily. 03/24/18  Yes Troy Sine, MD  rOPINIRole (REQUIP) 0.25 MG tablet TAKE 1 TABLET(0.25 MG) BY MOUTH TWICE DAILY Patient taking differently: Take 0.25 mg by mouth 2 (two) times daily. TAKE 1 TABLET(0.25 MG) BY MOUTH TWICE DAILY 09/12/18  Yes [name], [name], MD  rosuvastatin (CRESTOR) 40 MG tablet TAKE 1 TABLET(40 MG) BY MOUTH DAILY Patient taking differently: Take 40 mg by mouth daily. TAKE 1 TABLET(40 MG) BY MOUTH DAILY 11/30/18  Yes Troy Sine, MD  traMADol (ULTRAM) 50 MG tablet TAKE ONE-HALF TABLET BY MOUTH EVERY 8 HOURS AS NEEDED FOR PAIN Patient taking differently: Take 25 mg by mouth every 8 (eight) hours as needed for moderate pain. TAKE ONE-HALF TABLET BY MOUTH EVERY 8 HOURS AS NEEDED FOR PAIN 09/12/18  Yes [name], [name], MD    Family History Family History  Problem Relation Age of Onset   Heart disease Father        rheumatic fever   Heart attack Brother 68   Stroke Mother    Stroke Brother 36    Social History Social History   Tobacco Use   Smoking status: Former Smoker    Packs/day: 2.00    Years: 20.00    Pack years: 40.00    Types: Cigarettes    Quit date: 04/08/1986    Years since quitting: 32.7   Smokeless tobacco: Current User    Types: Chew  Substance Use Topics   Alcohol use: No    Alcohol/week: 0.0 standard drinks   Drug use: No     Allergies   Percocet [oxycodone-acetaminophen]   Review of Systems Review of Systems  All other systems reviewed and are negative.    Physical Exam Updated Vital Signs BP (!) 177/68 (BP Location: Right Arm)    Pulse 79    Temp (!) 97.3 F (36.3 C) (Temporal)    Resp (!) 22    Ht 5\' 6"  (1.676 m)    Wt 78.9 kg    SpO2 98%    BMI 28.08 kg/m   Physical Exam Vitals signs and nursing note reviewed.  Constitutional:      Appearance: He is well-developed.  HENT:     Head: Normocephalic and atraumatic.  Cardiovascular:     Rate and Rhythm: Normal rate and regular rhythm.     Heart sounds: No  murmur.  Pulmonary:     Effort: Pulmonary effort is normal. No respiratory distress.     Breath sounds: Normal breath sounds.  Abdominal:     Palpations: Abdomen is soft.     Tenderness: There is no abdominal tenderness. There is no  guarding or rebound.  Musculoskeletal:        General: No tenderness.  Skin:    General: Skin is warm and dry.  Neurological:     Mental Status: He is alert and oriented to person, place, and time.  Psychiatric:        Behavior: Behavior normal.      ED Treatments / Results  Labs (all labs ordered are listed, but only abnormal results are displayed) Labs Reviewed  SARS CORONAVIRUS 2 (HOSPITAL ORDER, [place] LAB)    EKG None  Radiology No results found.  Procedures Procedures (including critical care time)  Medications Ordered in ED Medications  0.9 %  sodium chloride infusion (has no administration in time range)  lactated ringers infusion (  Stopped 12/15/18 1410)  fentaNYL (SUBLIMAZE) 100 MCG/2ML injection (has no administration in time range)  midazolam PF (VERSED) 5 MG/ML injection (has no administration in time range)     Initial Impression / Assessment and Plan / ED Course  I have reviewed the triage vital signs and the nursing notes.  Pertinent labs & imaging results that were available during my care of the patient were reviewed by me and considered in my medical decision making (see chart for details).       Patient here for evaluation of esophageal food impaction, history of recurrent episodes similar in the past. He is in no acute distress on ED evaluation. Discussed with on-call provider for G.I. Plan to admit for endoscopy. Final Clinical Impressions(s) / ED Diagnoses   Final diagnoses:  None    ED Discharge Orders    None       Quintella Reichert, MD 12/15/18 1444

## 2018-12-15 NOTE — ED Notes (Signed)
Endo arrived to take pt.

## 2018-12-15 NOTE — ED Triage Notes (Signed)
Pt reports since yesterday at breakfast had piece of country ham stuck in his throat. Reports intermittent pains with it.

## 2018-12-15 NOTE — Op Note (Signed)
[facility] Patient Name: Torris House Procedure Date: 12/15/2018 MRN: [id number] Attending MD: Gatha Mayer , MD Date of Birth: 20-Jan-1933 CSN: [id number] Age: 85 Admit Type: Outpatient Procedure:                Upper GI endoscopy Indications:              Foreign body in the esophagus Providers:                Gatha Mayer, MD, Carlyn Reichert, RN, Josie Dixon, RN, William Dalton, Technician Referring MD:              Medicines:                Midazolam 4 mg IV, Fentanyl 50 micrograms IV Complications:            No immediate complications. Estimated Blood Loss:     Estimated blood loss was minimal. Procedure:                Pre-Anesthesia Assessment:                           - Prior to the procedure, a History and Physical                            was performed, and patient medications and                            allergies were reviewed. The patient's tolerance of                            previous anesthesia was also reviewed. The risks                            and benefits of the procedure and the sedation                            options and risks were discussed with the patient.                            All questions were answered, and informed consent                            was obtained. Prior Anticoagulants: The patient                            last took Eliquis (apixaban) 2 days prior to the                            procedure. ASA Grade Assessment: III - A patient                            with severe systemic disease. After reviewing the  risks and benefits, the patient was deemed in                            satisfactory condition to undergo the procedure.                           After obtaining informed consent, the endoscope was                            passed under direct vision. Throughout the                            procedure, the patient's blood pressure, pulse,  and                            oxygen saturations were monitored continuously. The                            GIF-H190 ([id number]) Olympus gastroscope was                            introduced through the mouth, and advanced to the                            antrum of the stomach. The upper GI endoscopy was                            accomplished without difficulty. The patient                            tolerated the procedure well. Scope In: Scope Out: Findings:      Food was found in the distal esophagus. Removal of food was       accomplished. Used ligation cap and several passes Estimated blood loss:       none. Mild edema and inflammatory change.      One benign-appearing, intrinsic moderate stenosis was found at the       gastroesophageal junction. The stenosis was traversed. A TTS dilator was       passed through the scope. Dilation with a 15-16.5-18 mm balloon dilator       was performed to 15 mm. The dilation site was examined and showed mild       mucosal disruption. Estimated blood loss was minimal.      A 2 cm hiatal hernia was present.      The cardia and gastric fundus were normal on retroflexion.      The exam was otherwise without abnormality. Impression:               - Food in the distal esophagus. Removal was                            successful.                           - Benign-appearing esophageal stenosis. Dilated. 15  mm max                           - 2 cm hiatal hernia.                           - The examination was otherwise normal. exam to                            antrum Moderate Sedation:      Moderate (conscious) sedation was administered by the endoscopy nurse       and supervised by the endoscopist. The following parameters were       monitored: oxygen saturation, heart rate, blood pressure, and response       to care. Total physician intraservice time was 15 minutes. Recommendation:           - Patient has a contact  number available for                            emergencies. The signs and symptoms of potential                            delayed complications were discussed with the                            patient. Return to normal activities tomorrow.                            Written discharge instructions were provided to the                            patient.                           - Clear liquids x 1 hour then soft foods rest of                            day. Start prior diet tomorrow.                           - Continue present medications.                           - SEE DR. Carlean Purl NOV 10 AT 210 PM MAY NEED                            ADDITIONAL DILATION                           CUT FOOD SMALL AND CHEW VERY WELL - DRINK LIQUIDS                            AFTER SWALLOWING - SIT UPRIGHT - DO NOT TALK WHEN  EATING                           - Resume Eliquis (apixaban) at prior dose tomorrow. Procedure Code(s):        --- Professional ---                           [phone], Esophagoscopy, flexible, transoral; with                            removal of foreign body(s)                           43220, Esophagoscopy, flexible, transoral; with                            transendoscopic balloon dilation (less than 30 mm                            diameter) Diagnosis Code(s):        --- Professional ---                           B63.845X, Food in esophagus causing other injury,                            initial encounter                           K22.2, Esophageal obstruction                           T18.108A, Unspecified foreign body in esophagus                            causing other injury, initial encounter CPT copyright 2019 [employer] Association. All rights reserved. The codes documented in this report are preliminary and upon coder review may  be revised to meet current compliance requirements. Gatha Mayer, MD 12/15/2018 1:54:53 PM This report has  been signed electronically. Number of Addenda: 0

## 2018-12-15 NOTE — ED Notes (Signed)
Recevied call from [place]. Pt arriving with food impaction. Requested call to on call dr upon placement in room.

## 2018-12-15 NOTE — Discharge Instructions (Addendum)
° °  I removed food lodged in the esophagus and dilated the esophagus some - to 15 mm. We usually try to get it to 18 mm but 15 mm was as much as I thought ok for today.  RESTART ELIQUIS TOMORROW  YOU HAD AN ENDOSCOPIC PROCEDURE TODAY: Refer to the procedure report and other information in the discharge instructions given to you for any specific questions about what was found during the examination. If this information does not answer your questions, please call Dr. Celesta Aver office at [phone] to clarify.   YOU SHOULD EXPECT: Some feelings of bloating in the abdomen. Passage of more gas than usual. Walking can help get rid of the air that was put into your GI tract during the procedure and reduce the bloating. If you had a lower endoscopy (such as a colonoscopy or flexible sigmoidoscopy) you may notice spotting of blood in your stool or on the toilet paper. Some abdominal soreness may be present for a day or two, also.  DIET:   CLEAR LIQUIDS ONLY UNTIL 3 PM  SOFT FOODS AFTER THAT  TRY NORMAL TYPE FOODS TOMORROW BUT CUT FOOD SMALL AND CHEW VERY WELL - DRINK LIQUIDS AFTER SWALLOWING - SIT UPRIGHT - DO NOT TALK WHEN EATING   ACTIVITY: Your care partner should take you home directly after the procedure. You should plan to take it easy, moving slowly for the rest of the day. You can resume normal activity the day after the procedure however YOU SHOULD NOT DRIVE, use power tools, machinery or perform tasks that involve climbing or major physical exertion for 24 hours (because of the sedation medicines used during the test).   SYMPTOMS TO REPORT IMMEDIATELY: A gastroenterologist can be reached at any hour. Please call [phone]  for any of the following symptoms:   Following upper endoscopy (EGD, EUS, ERCP, esophageal dilation) Vomiting of blood or coffee ground material  New, significant abdominal pain  New, significant chest pain or pain under the shoulder blades  Painful or  persistently difficult swallowing  New shortness of breath  Black, tarry-looking or red, bloody stools

## 2018-12-15 NOTE — Telephone Encounter (Signed)
Call from patient's daughter this morning. Mr. Gramm has been unable to swallow liquids and is unable to tolerate his secretions since eating country ham yesterday morning around 9am. Refused to come to ED yesterday. On Eliquis with the last dose 12/13/18.  Spoke to the [employer] Nurse Jinny Blossom) and asked her to call the [facility] On-Call MD on patient arrival to the ED.

## 2018-12-16 ENCOUNTER — Encounter (HOSPITAL_COMMUNITY): Payer: Self-pay | Admitting: Internal Medicine

## 2018-12-22 DIAGNOSIS — N189 Chronic kidney disease, unspecified: Secondary | ICD-10-CM | POA: Diagnosis not present

## 2018-12-22 DIAGNOSIS — N2581 Secondary hyperparathyroidism of renal origin: Secondary | ICD-10-CM | POA: Diagnosis not present

## 2018-12-22 DIAGNOSIS — N183 Chronic kidney disease, stage 3 unspecified: Secondary | ICD-10-CM | POA: Diagnosis not present

## 2018-12-28 DIAGNOSIS — N2581 Secondary hyperparathyroidism of renal origin: Secondary | ICD-10-CM | POA: Diagnosis not present

## 2018-12-28 DIAGNOSIS — I129 Hypertensive chronic kidney disease with stage 1 through stage 4 chronic kidney disease, or unspecified chronic kidney disease: Secondary | ICD-10-CM | POA: Diagnosis not present

## 2018-12-28 DIAGNOSIS — D631 Anemia in chronic kidney disease: Secondary | ICD-10-CM | POA: Diagnosis not present

## 2018-12-28 DIAGNOSIS — N183 Chronic kidney disease, stage 3 unspecified: Secondary | ICD-10-CM | POA: Diagnosis not present

## 2019-01-18 ENCOUNTER — Encounter: Payer: Self-pay | Admitting: Family Medicine

## 2019-01-18 ENCOUNTER — Other Ambulatory Visit: Payer: Self-pay

## 2019-01-18 ENCOUNTER — Other Ambulatory Visit (INDEPENDENT_AMBULATORY_CARE_PROVIDER_SITE_OTHER): Payer: Medicare Other

## 2019-01-18 ENCOUNTER — Other Ambulatory Visit: Payer: Medicare Other

## 2019-01-18 DIAGNOSIS — E039 Hypothyroidism, unspecified: Secondary | ICD-10-CM

## 2019-01-18 LAB — TSH: TSH: 4.06 u[IU]/mL (ref 0.35–4.50)

## 2019-01-24 ENCOUNTER — Ambulatory Visit: Payer: Medicare Other | Admitting: Internal Medicine

## 2019-01-26 ENCOUNTER — Encounter: Payer: Self-pay | Admitting: Internal Medicine

## 2019-01-26 ENCOUNTER — Ambulatory Visit: Payer: Medicare Other | Admitting: Internal Medicine

## 2019-01-26 VITALS — BP 120/70 | HR 68 | Temp 98.0°F | Ht 66.0 in | Wt 185.4 lb

## 2019-01-26 DIAGNOSIS — K219 Gastro-esophageal reflux disease without esophagitis: Secondary | ICD-10-CM | POA: Insufficient documentation

## 2019-01-26 DIAGNOSIS — K222 Esophageal obstruction: Secondary | ICD-10-CM | POA: Diagnosis not present

## 2019-01-26 NOTE — Patient Instructions (Signed)
Glad you are doing well.  Stay on current medications and continue to cut food small, chew well and take your time with eating.  I appreciate the opportunity to care for you. Gatha Mayer, MD, [facility]

## 2019-01-26 NOTE — Progress Notes (Signed)
Gregory [name] 85 y.o. July 13, 1932 [id number]  Assessment & Plan:  GERD with stricture Doing well on PPI and after dilation of esophageal stricture to 15 mm.  Follow-up as needed.  Continue careful chewing cutting food etc.    CC: [name], [name], MD   Subjective:   Chief Complaint: Follow-up of esophageal stricture  HPI Patient is an 85 year old white man with GERD and esophageal stricture, he had a food impaction on October 1 which was removed and then I dilated a GE junction stricture to 15 mm.  He has had no dysphagia since.  He is maintained on a PPI.  He is cutting food smaller and chewing more carefully as well.   Wt Readings from Last 3 Encounters:  01/26/19 185 lb 6.4 oz (84.1 kg)  12/15/18 174 lb (78.9 kg)  12/07/18 183 lb (83 kg)    Allergies  Allergen Reactions  . Percocet [Oxycodone-Acetaminophen] Itching and Nausea Only   Current Meds  Medication Sig  . apixaban (ELIQUIS) 2.5 MG TABS tablet TAKE 1 TABLET(2.5 MG) BY MOUTH TWICE DAILY (Patient taking differently: Take 2.5 mg by mouth 2 (two) times daily. TAKE 1 TABLET(2.5 MG) BY MOUTH TWICE DAILY)  . carvedilol (COREG) 3.125 MG tablet TAKE 1 TABLET(3.125 MG) BY MOUTH TWICE DAILY WITH A MEAL (Patient taking differently: Take 3.125 mg by mouth 2 (two) times daily with a meal. TAKE 1 TABLET(3.125 MG) BY MOUTH TWICE DAILY WITH A MEAL)  . levothyroxine (SYNTHROID) 25 MCG tablet Take 1 tablet (25 mcg total) by mouth daily before breakfast.  . lisinopril (ZESTRIL) 5 MG tablet Take 1 tablet (5 mg total) by mouth daily.  . pantoprazole (PROTONIX) 40 MG tablet Take 1 tablet (40 mg total) by mouth 2 (two) times daily.  Marland Kitchen rOPINIRole (REQUIP) 0.25 MG tablet TAKE 1 TABLET(0.25 MG) BY MOUTH TWICE DAILY (Patient taking differently: Take 0.25 mg by mouth 2 (two) times daily. TAKE 1 TABLET(0.25 MG) BY MOUTH TWICE DAILY)  . rosuvastatin (CRESTOR) 40 MG tablet TAKE 1 TABLET(40 MG) BY MOUTH DAILY (Patient taking differently:  Take 40 mg by mouth daily. TAKE 1 TABLET(40 MG) BY MOUTH DAILY)  . traMADol (ULTRAM) 50 MG tablet TAKE ONE-HALF TABLET BY MOUTH EVERY 8 HOURS AS NEEDED FOR PAIN (Patient taking differently: Take 25 mg by mouth every 8 (eight) hours as needed for moderate pain. TAKE ONE-HALF TABLET BY MOUTH EVERY 8 HOURS AS NEEDED FOR PAIN)   Past Medical History:  Diagnosis Date  . Acute respiratory failure ([place])   . Appendicitis with abscess 03/31/2011  . Arthritis   . CAD (coronary artery disease),CABG 1993-LIMA to the LAD, SVG to Om and SVG to PDA/PLA, patent on cath 2008. 2003   a. s/p CABG 2003. b. last cath 2008 with patent grafts.  . Chronic atrial fibrillation ([facility])    permanent  . Chronic systolic CHF (congestive heart failure) ([place])    a. Prior low EF, later normalized.  . Essential hypertension   . GERD (gastroesophageal reflux disease)   . Heart murmur   . Hyperlipidemia   . Ischemic cardiomyopathy   . Kidney stones    "passed all but one time when he had to have lithotripsy" (01/21/2013)  . Pulmonary hypertension ([place])   . 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 07/2015 - after holding Xarelto x 3 days for spinal procedure.  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 SSS (sick sinus syndrome) ([place])   . Stroke [facility]) 2007   "slightly drags left foot  since; recovered qthing else" (01/21/2013)  . Syncope 07/2015   a. felt vasovagal in setting of pain from renal infarct.   Past Surgical History:  Procedure Laterality Date  . BALLOON DILATION N/A 05/22/2015   Procedure: BALLOON DILATION;  Surgeon: Gatha Mayer, MD;  Location: [facility] ENDOSCOPY;  Service: Endoscopy;  Laterality: N/A;  . BALLOON DILATION N/A 12/15/2018   Procedure: BALLOON DILATION;  Surgeon: Gatha Mayer, MD;  Location: [facility] ENDOSCOPY;  Service: Endoscopy;  Laterality: N/A;  . CARDIAC CATHETERIZATION  02/24/2002   reduced LV function, 60-70% prox RCA stenosis, 70% PLA ostial stenosis, 80% secondary branch of PLA stenosis - subsequent CABG (Dr. Jackie Plum)  .  Carotid Doppler  06/2012   50-69% right bulb/prox ICA diameter reduction; 70-99% left bulb/prox ICA diameter reduction  . CATARACT EXTRACTION W/ INTRAOCULAR LENS IMPLANT Bilateral 2013  . COLONOSCOPY N/A 05/18/2016   Procedure: COLONOSCOPY;  Surgeon: Manus Gunning, MD;  Location[facility] ENDOSCOPY;  Service: Gastroenterology;  Laterality: N/A;  . CORONARY ANGIOPLASTY  07/05/2006   3 vessel CAD, patent LIMA to LAD, patentVG to OM, patent SVG to PDA & PLA, mild MR, severe LV systolic dysfunction (Dr. Gerrie Nordmann)  . CORONARY ARTERY BYPASS GRAFT  03/01/2002   LIMA to LAD, reverse SVG to OM, reverse SVG to PDA of RCA, reverse SVG to PLA of RCA, ligation of LA appendage (Dr. Servando Snare)  . ENTEROSCOPY N/A 05/20/2016   Procedure: ENTEROSCOPY;  Surgeon: Manus Gunning, MD;  Location: [place];  Service: Gastroenterology;  Laterality: N/A;  . ESOPHAGOGASTRODUODENOSCOPY  02/01/2012   Procedure: ESOPHAGOGASTRODUODENOSCOPY (EGD);  Surgeon: Beryle Beams, MD;  Location: [facility];  Service: Endoscopy;  Laterality: N/A;  . ESOPHAGOGASTRODUODENOSCOPY N/A 05/22/2015   Procedure: ESOPHAGOGASTRODUODENOSCOPY (EGD);  Surgeon: Gatha Mayer, MD;  Location: [facility];  Service: Endoscopy;  Laterality: N/A;  . ESOPHAGOGASTRODUODENOSCOPY N/A 05/17/2016   Procedure: ESOPHAGOGASTRODUODENOSCOPY (EGD);  Surgeon: Juanita Craver, MD;  Location: [facility] ENDOSCOPY;  Service: Endoscopy;  Laterality: N/A;  . ESOPHAGOGASTRODUODENOSCOPY N/A 09/17/2017   Procedure: ESOPHAGOGASTRODUODENOSCOPY (EGD);  Surgeon: Milus Banister, MD;  Location: [facility] ENDOSCOPY;  Service: Endoscopy;  Laterality: N/A;  . ESOPHAGOGASTRODUODENOSCOPY (EGD) WITH PROPOFOL N/A 12/15/2018   Procedure: ESOPHAGOGASTRODUODENOSCOPY (EGD) WITH PROPOFOL;  Surgeon: Gatha Mayer, MD;  Location: [facility] ENDOSCOPY;  Service: Endoscopy;  Laterality: N/A;  . FOREIGN BODY REMOVAL  09/17/2017   Procedure: FOREIGN BODY REMOVAL;  Surgeon: Milus Banister, MD;  Location: [facility]  ENDOSCOPY;  Service: Endoscopy;;  . FOREIGN BODY REMOVAL  12/15/2018   Procedure: FOREIGN BODY REMOVAL;  Surgeon: Gatha Mayer, MD;  Location: [facility] ENDOSCOPY;  Service: Endoscopy;;  . GIVENS CAPSULE STUDY N/A 05/18/2016   Procedure: GIVENS CAPSULE STUDY;  Surgeon: Manus Gunning, MD;  Location: [place];  Service: Gastroenterology;  Laterality: N/A;  . LAPAROSCOPIC APPENDECTOMY  03/30/2011   Procedure: APPENDECTOMY LAPAROSCOPIC;  Surgeon: Joyice Faster. Cornett, MD;  Location: [place];  Service: General;  Laterality: N/A;  . LEFT HEART CATHETERIZATION WITH CORONARY ANGIOGRAM N/A 01/24/2013   Procedure: LEFT HEART CATHETERIZATION WITH CORONARY ANGIOGRAM;  Surgeon: [name], MD;  Location: [facility] CATH LAB;  Service: Cardiovascular;  Laterality: N/A;  Right heart with grafts  . LITHOTRIPSY     "once" (01/21/2013)  . NM MYOCAR PERF WALL MOTION  05/2009   persantine myoview - fixed moderate perfusion defect in inferior wall & lateral segment of apex (poor non-transmural infarction), minimal anterolateral periinfarct reversible ischemia seen, abnormal study, defects similar to 2006 study  .  RIGHT/LEFT HEART CATH AND CORONARY ANGIOGRAPHY N/A 09/17/2017   Procedure: RIGHT/LEFT HEART CATH AND CORONARY ANGIOGRAPHY;  Surgeon: Martinique, Peter M, MD;  Location: [place] CV LAB;  Service: Cardiovascular;  Laterality: N/A;  . SPLENECTOMY, TOTAL  01/2013  . TEE WITHOUT CARDIOVERSION N/A 01/23/2013   Procedure: TRANSESOPHAGEAL ECHOCARDIOGRAM (TEE);  Surgeon: Sanda Klein, MD;  Location: [facility] ENDOSCOPY;  Service: Cardiovascular;  Laterality: N/A;  . TRANSTHORACIC ECHOCARDIOGRAM  06/23/2012   EF 40-45%, mild LVH; mild AV regurg; mild MV regurg; LV mod-severely dilated; RV mildly dilated; systolic pressure borderline increased; RA mod dilated   Social History   Social History Narrative   A she is married, 5 sons 4 daughters. He is retired Horticulturist, commercial. 2-3 caffeinated beverages a day no alcohol   family  history includes Heart attack (age of onset: 52) in his brother; Heart disease in his father; Stroke in his mother; Stroke (age of onset: 28) in his brother.   Review of Systems As per HPI  Objective:   Physical Exam BP 120/70   Pulse 68   Temp 98 F (36.7 C)   Ht 5\' 6"  (1.676 m)   Wt 185 lb 6.4 oz (84.1 kg)   BMI 29.92 kg/m  NAD elderly wm

## 2019-01-26 NOTE — Assessment & Plan Note (Signed)
Doing well on PPI and after dilation of esophageal stricture to 15 mm.  Follow-up as needed.  Continue careful chewing cutting food etc.

## 2019-02-22 ENCOUNTER — Telehealth: Payer: Self-pay | Admitting: Cardiovascular Disease

## 2019-02-22 NOTE — Telephone Encounter (Signed)
Spoke with pt daughter. Informed that PCP 9/23 note included info about recheck of labs 6 months from the last. Pt has appt 05/17/2019 with PCP and 05/31/2019 with Dr. Claiborne Billings. Pt daughter states PCP usually orders labs and if dr. Claiborne Billings ever orders pt labs he will have him come in a week prior to his f/u appt. Advised pt daughter to f/u with PCP for lab orders. She states she will do this so pt can have labs drawn during 3/3 appt with PCP

## 2019-02-22 NOTE — Telephone Encounter (Signed)
Patient's daughter called and says she was told the patient needed lab work done before his appointment on 05/31/19, but there were no orders.

## 2019-02-25 ENCOUNTER — Other Ambulatory Visit: Payer: Self-pay | Admitting: Cardiovascular Disease

## 2019-03-26 ENCOUNTER — Other Ambulatory Visit: Payer: Self-pay | Admitting: Cardiovascular Disease

## 2019-03-28 ENCOUNTER — Other Ambulatory Visit: Payer: Self-pay

## 2019-03-28 ENCOUNTER — Other Ambulatory Visit: Payer: Self-pay | Admitting: Cardiovascular Disease

## 2019-03-28 MED ORDER — PANTOPRAZOLE SODIUM 40 MG PO TBEC: 40.0000 mg | DELAYED_RELEASE_TABLET | Freq: Two times a day (BID) | ORAL | 2 refills | Status: DC

## 2019-05-15 NOTE — Progress Notes (Addendum)
[facility] [employer] [facility] [address], [place], [place] [address] [phone]  Date:  05/17/2019   Name:  Gregory [name]   DOB:  1933/01/12   MRN:  [id number]  PCP:  Darreld Mclean, MD    Chief Complaint: Atrial Fibrillation (lab work)   History of Present Illness:  Gregory [name] is a 86 y.o. very pleasant male patient who presents with the following:  Here today for periodic in office follow-up Last seen by myself in September-at that point he was stable, I started medication for hypothyroidism Recheck TSH in November in range  Patient with history of CAD status post CABG 2003, A. 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 Andrena Mews visit in October, note is reviewed Cardiologist is Dr. Claiborne Billings  He was seen in the ER in October with an esophageal obstruction Dr. Arelia Longest took him to the OR the same day and removed food from the distal esophagus, dilated esophageal stenosis.  He was seen for follow-up in November at which point he was doing okay He has been swallowing ok since that time, no more episodes of food getting stuck  He is seeing Dr. Claiborne Billings in about 2 weeks, will update labs today for that appointment  Flu vaccine- done in September Needs DEXA scan  He is otherwise doing well at this time, no other real concerns.  He is having some itchy eyes due to allergies  Patient Active Problem List   Diagnosis Date Noted  . GERD with stricture 01/26/2019  . Hypothyroid 12/12/2018  . Prediabetes 12/04/2018  . Esophageal obstruction due to food impaction   . Chronic anemia 01/13/2016  . Chronic renal failure, stage 3  (moderate) ([place]) 08/06/2015  . Vasovagal syncope   . Renal infarction ([place]) 07/31/2015  . Syncope 07/30/2015  . Esophageal stricture   . Carotid stenosis 07/13/2013  . Anticoagulated on Eliquis 03/21/2013  . PVD - 77% LICA, 82% RICA by doppler 5/14 01/24/2013  . 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 CAD (coronary artery disease),CABG 1993-LIMA to the LAD, SVG to Om and SVG to PDA/PLA, patent on cath 2014. 03/31/2011  . Chronic a-fib ([place]) 03/31/2011  . Cardiomyopathy, ischemic, improved 03/31/2011  . CVA (cerebral vascular accident) ([place]) 03/31/2011  . Renal calculi 03/31/2011  . History of tobacco use, continues with chewing tobacco 03/31/2011    Past Medical History:  Diagnosis Date  . Acute respiratory failure ([place])   . Appendicitis with abscess 03/31/2011  . Arthritis   . CAD (coronary artery disease),CABG 1993-LIMA to the LAD, SVG to Om and SVG to PDA/PLA, patent on cath 2008. 2003   a. s/p CABG 2003. b. last cath 2008 with patent grafts.  . Chronic atrial fibrillation (HCC)    permanent  . Chronic systolic CHF (congestive heart failure) ([place])    a. Prior low EF, later normalized.  . Essential hypertension   . GERD (gastroesophageal reflux disease)   . Heart murmur   . Hyperlipidemia   . Ischemic cardiomyopathy   . Kidney stones    "passed all but one time when he had to have lithotripsy" (01/21/2013)  . Pulmonary hypertension ([place])   .  Renal infarct ([place])    a. 07/2015 - after holding Xarelto x 3 days for spinal procedure.  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 SSS (sick sinus syndrome) ([place])   . Stroke [facility]) 2007   "slightly drags left foot since; recovered qthing else" (01/21/2013)  . Syncope 07/2015   a. felt vasovagal in setting of pain from renal infarct.    Past Surgical History:  Procedure Laterality Date  . BALLOON DILATION N/A 05/22/2015   Procedure: BALLOON DILATION;  Surgeon: Gatha Mayer, MD;  Location: [facility]  ENDOSCOPY;  Service: Endoscopy;  Laterality: N/A;  . BALLOON DILATION N/A 12/15/2018   Procedure: BALLOON DILATION;  Surgeon: Gatha Mayer, MD;  Location: [facility] ENDOSCOPY;  Service: Endoscopy;  Laterality: N/A;  . CARDIAC CATHETERIZATION  02/24/2002   reduced LV function, 60-70% prox RCA stenosis, 70% PLA ostial stenosis, 80% secondary branch of PLA stenosis - subsequent CABG (Dr. Jackie Plum)  . Carotid Doppler  06/2012   50-69% right bulb/prox ICA diameter reduction; 70-99% left bulb/prox ICA diameter reduction  . CATARACT EXTRACTION W/ INTRAOCULAR LENS IMPLANT Bilateral 2013  . COLONOSCOPY N/A 05/18/2016   Procedure: COLONOSCOPY;  Surgeon: Manus Gunning, MD;  Location[facility] ENDOSCOPY;  Service: Gastroenterology;  Laterality: N/A;  . CORONARY ANGIOPLASTY  07/05/2006   3 vessel CAD, patent LIMA to LAD, patentVG to OM, patent SVG to PDA & PLA, mild MR, severe LV systolic dysfunction (Dr. Gerrie Nordmann)  . CORONARY ARTERY BYPASS GRAFT  03/01/2002   LIMA to LAD, reverse SVG to OM, reverse SVG to PDA of RCA, reverse SVG to PLA of RCA, ligation of LA appendage (Dr. Servando Snare)  . ENTEROSCOPY N/A 05/20/2016   Procedure: ENTEROSCOPY;  Surgeon: Manus Gunning, MD;  Location: [facility];  Service: Gastroenterology;  Laterality: N/A;  . ESOPHAGOGASTRODUODENOSCOPY  02/01/2012   Procedure: ESOPHAGOGASTRODUODENOSCOPY (EGD);  Surgeon: Beryle Beams, MD;  Location: [facility] ENDOS[facility];  Service: Endoscopy;  Laterality: N/A;  . ESOPHAGOGASTRODUODENOSCOPY N/A 05/22/2015   Procedure: ESOPHAGOGASTRODUODENOSCOPY (EGD);  Surgeon: Gatha Mayer, MD;  Location: [facility] ENDOSCOPY;  Service: Endoscopy;  Laterality: N/A;  . ESOPHAGOGASTRODUODENOSCOPY N/A 05/17/2016   Procedure: ESOPHAGOGASTRODUODENOSCOPY (EGD);  Surgeon: Juanita Craver, MD;  Location: [facility] ENDOSCOPY;  Service: Endoscopy;  Laterality: N/A;  . ESOPHAGOGASTRODUODENOSCOPY N/A 09/17/2017   Procedure: ESOPHAGOGASTRODUODENOSCOPY (EGD);  Surgeon: Milus Banister, MD;   Location: [facility] ENDOSCOPY;  Service: Endoscopy;  Laterality: N/A;  . ESOPHAGOGASTRODUODENOSCOPY (EGD) WITH PROPOFOL N/A 12/15/2018   Procedure: ESOPHAGOGASTRODUODENOSCOPY (EGD) WITH PROPOFOL;  Surgeon: Gatha Mayer, MD;  Location: [facility] ENDOSCOPY;  Service: Endoscopy;  Laterality: N/A;  . FOREIGN BODY REMOVAL  09/17/2017   Procedure: FOREIGN BODY REMOVAL;  Surgeon: Milus Banister, MD;  Location: [facility] ENDOSCOPY;  Service: Endoscopy;;  . FOREIGN BODY REMOVAL  12/15/2018   Procedure: FOREIGN BODY REMOVAL;  Surgeon: Gatha Mayer, MD;  Location: [facility] ENDOSCOPY;  Service: Endoscopy;;  . GIVENS CAPSULE STUDY N/A 05/18/2016   Procedure: GIVENS CAPSULE STUDY;  Surgeon: Manus Gunning, MD;  Location: [facility];  Service: Gastroenterology;  Laterality: N/A;  . LAPAROSCOPIC APPENDECTOMY  03/30/2011   Procedure: APPENDECTOMY LAPAROSCOPIC;  Surgeon: Joyice Faster. Cornett, MD;  Location: [facility];  Service: General;  Laterality: N/A;  . LEFT HEART CATHETERIZATION WITH CORONARY ANGIOGRAM N/A 01/24/2013   Procedure: LEFT HEART CATHETERIZATION WITH CORONARY ANGIOGRAM;  Surgeon: [name], MD;  Location: [facility] CATH LAB;  Service: Cardiovascular;  Laterality: N/A;  Right heart with grafts  . LITHOTRIPSY     "once" (01/21/2013)  .  NM MYOCAR PERF WALL MOTION  05/2009   persantine myoview - fixed moderate perfusion defect in inferior wall & lateral segment of apex (poor non-transmural infarction), minimal anterolateral periinfarct reversible ischemia seen, abnormal study, defects similar to 2006 study  . RIGHT/LEFT HEART CATH AND CORONARY ANGIOGRAPHY N/A 09/17/2017   Procedure: RIGHT/LEFT HEART CATH AND CORONARY ANGIOGRAPHY;  Surgeon: Martinique, Peter M, MD;  Location: [facility] CV LAB;  Service: Cardiovascular;  Laterality: N/A;  . SPLENECTOMY, TOTAL  01/2013  . TEE WITHOUT CARDIOVERSION N/A 01/23/2013   Procedure: TRANSESOPHAGEAL ECHOCARDIOGRAM (TEE);  Surgeon: Sanda Klein, MD;  Location: [facility] ENDOSCOPY;  Service:  Cardiovascular;  Laterality: N/A;  . TRANSTHORACIC ECHOCARDIOGRAM  06/23/2012   EF 40-45%, mild LVH; mild AV regurg; mild MV regurg; LV mod-severely dilated; RV mildly dilated; systolic pressure borderline increased; RA mod dilated    Social History   Tobacco Use  . Smoking status: Former Smoker    Packs/day: 2.00    Years: 20.00    Pack years: 40.00    Types: Cigarettes    Quit date: 04/08/1986    Years since quitting: 33.1  . Smokeless tobacco: Current User    Types: Chew  Substance Use Topics  . Alcohol use: No    Alcohol/week: 0.0 standard drinks  . Drug use: No    Family History  Problem Relation Age of Onset  . Heart disease Father        rheumatic fever  . Heart attack Brother 75  . Stroke Mother   . Stroke Brother 73    Allergies  Allergen Reactions  . Percocet [Oxycodone-Acetaminophen] Itching and Nausea Only    Medication list has been reviewed and updated.  Current Outpatient Medications on File Prior to Visit  Medication Sig Dispense Refill  . carvedilol (COREG) 3.125 MG tablet TAKE 1 TABLET(3.125 MG) BY MOUTH TWICE DAILY WITH A MEAL (Patient taking differently: Take 3.125 mg by mouth 2 (two) times daily with a meal. TAKE 1 TABLET(3.125 MG) BY MOUTH TWICE DAILY WITH A MEAL) 180 tablet 1  . ELIQUIS 2.5 MG TABS tablet TAKE 1 TABLET(2.5 MG) BY MOUTH TWICE DAILY 180 tablet 1  . levothyroxine (SYNTHROID) 25 MCG tablet Take 1 tablet (25 mcg total) by mouth daily before breakfast. 30 tablet 6  . lisinopril (ZESTRIL) 5 MG tablet Take 1 tablet (5 mg total) by mouth daily. 90 tablet 1  . pantoprazole (PROTONIX) 40 MG tablet Take 1 tablet (40 mg total) by mouth 2 (two) times daily. 180 tablet 2  . rOPINIRole (REQUIP) 0.25 MG tablet TAKE 1 TABLET(0.25 MG) BY MOUTH TWICE DAILY (Patient taking differently: Take 0.25 mg by mouth 2 (two) times daily. TAKE 1 TABLET(0.25 MG) BY MOUTH TWICE DAILY) 180 tablet 3  . rosuvastatin (CRESTOR) 40 MG tablet TAKE 1 TABLET(40 MG) BY MOUTH  DAILY (Patient taking differently: Take 40 mg by mouth daily. TAKE 1 TABLET(40 MG) BY MOUTH DAILY) 90 tablet 1  . traMADol (ULTRAM) 50 MG tablet TAKE ONE-HALF TABLET BY MOUTH EVERY 8 HOURS AS NEEDED FOR PAIN (Patient taking differently: Take 25 mg by mouth every 8 (eight) hours as needed for moderate pain. TAKE ONE-HALF TABLET BY MOUTH EVERY 8 HOURS AS NEEDED FOR PAIN) 30 tablet 1   No current facility-administered medications on file prior to visit.    Review of Systems:  As per HPI- otherwise negative. No fever chills  Physical Examination: Vitals:   05/17/19 1258  BP: 126/72  Pulse: 62  Resp: 17  Temp: (!)  96.5 F (35.8 C)  SpO2: 98%   Vitals:   05/17/19 1258  Weight: 190 lb (86.2 kg)  Height: 5\' 6"  (1.676 m)   Body mass index is 30.67 kg/m. Ideal Body Weight: Weight in (lb) to have BMI = 25: 154.6  GEN: no acute distress.  Appears to be his normal self HEENT: Atraumatic, Normocephalic.  Ears and Nose: No external deformity. CV: RRR-seems to be in sinus rhythm today, No M/G/R. No JVD. No thrill. No extra heart sounds. PULM: CTA B, no wheezes, crackles, rhonchi. No retractions. No resp. distress. No accessory muscle use. ABD: S, NT, ND, +BS. No rebound. No HSM. EXTR: No c/c/e PSYCH: Normally interactive. Conversant.    Assessment and Plan: Acquired hypothyroidism - Plan: TSH  Persistent atrial fibrillation ([place]) - Plan: CBC  Anticoagulated on Eliquis - Plan: CBC  Chronic kidney disease, unspecified CKD stage - Plan: Comprehensive metabolic panel  Abnormal TSH  Prediabetes - Plan: Hemoglobin A1c  Coronary artery disease involving coronary bypass graft of native heart without angina pectoris - Plan: Lipid panel  Cardiomyopathy, ischemic, improved  Deficiency anemia  RLS (restless legs syndrome)  Here today for follow-up visit. He is overall feeling well, no particular side effects from Eliquis such as bleeding.  I did refill his tramadol which he uses for  chronic back and leg pain He had previously undergone spinal injections for his back pain, but this is when he suffered a renal infarct (due to holding Eliquis) and so he no longer does these injections  Ordered labs as above for follow-up of thyroid, lipids, kidney disease, prediabetes  Will remind patient about DEXA scan with upcoming lab report  Asked him to follow-up in 6 months  Moderate medical decision making today This visit occurred during the SARS-CoV-2 public health emergency.  Safety protocols were in place, including screening questions prior to the visit, additional usage of staff PPE, and extensive cleaning of exam room while observing appropriate contact time as indicated for disinfecting solutions.    Signed Lamar Blinks, MD  Received his labs as below, letter to patient  Results for orders placed or performed in visit on 05/17/19  CBC  Result Value Ref Range   WBC 5.7 4.0 - 10.5 K/uL   RBC 4.00 (L) 4.22 - 5.81 Mil/uL   Platelets 183.0 150.0 - 400.0 K/uL   Hemoglobin 12.7 (L) 13.0 - 17.0 g/dL   HCT 37.5 (L) 39.0 - 52.0 %   MCV 93.8 78.0 - 100.0 fl   MCHC 33.8 30.0 - 36.0 g/dL   RDW 14.6 11.5 - 15.5 %  Comprehensive metabolic panel  Result Value Ref Range   Sodium 139 135 - 145 mEq/L   Potassium 4.9 3.5 - 5.1 mEq/L   Chloride 103 96 - 112 mEq/L   CO2 29 19 - 32 mEq/L   Glucose, Bld 90 70 - 99 mg/dL   BUN 29 (H) 6 - 23 mg/dL   Creatinine, Ser 1.82 (H) 0.40 - 1.50 mg/dL   Total Bilirubin 0.4 0.2 - 1.2 mg/dL   Alkaline Phosphatase 85 39 - 117 U/L   AST 18 0 - 37 U/L   ALT 11 0 - 53 U/L   Total Protein 7.1 6.0 - 8.3 g/dL   Albumin 3.7 3.5 - 5.2 g/dL   GFR 35.58 (L) >60.00 mL/min   Calcium 9.3 8.4 - 10.5 mg/dL  Hemoglobin A1c  Result Value Ref Range   Hgb A1c MFr Bld 6.1 4.6 - 6.5 %  Lipid  panel  Result Value Ref Range   Cholesterol 150 0 - 200 mg/dL   Triglycerides 102.0 0.0 - 149.0 mg/dL   HDL 46.10 >39.00 mg/dL   VLDL 20.4 0.0 - 40.0 mg/dL   LDL  Cholesterol 83 0 - 99 mg/dL   Total CHOL/HDL Ratio 3    NonHDL 103.56   TSH  Result Value Ref Range   TSH 4.39 0.35 - 4.50 uIU/mL

## 2019-05-17 ENCOUNTER — Other Ambulatory Visit: Payer: Self-pay

## 2019-05-17 ENCOUNTER — Encounter: Payer: Self-pay | Admitting: Family Medicine

## 2019-05-17 ENCOUNTER — Ambulatory Visit (INDEPENDENT_AMBULATORY_CARE_PROVIDER_SITE_OTHER): Payer: Medicare Other | Admitting: Family Medicine

## 2019-05-17 VITALS — BP 126/72 | HR 62 | Temp 96.5°F | Resp 17 | Ht 66.0 in | Wt 190.0 lb

## 2019-05-17 DIAGNOSIS — E039 Hypothyroidism, unspecified: Secondary | ICD-10-CM

## 2019-05-17 DIAGNOSIS — I4819 Other persistent atrial fibrillation: Secondary | ICD-10-CM | POA: Diagnosis not present

## 2019-05-17 DIAGNOSIS — I255 Ischemic cardiomyopathy: Secondary | ICD-10-CM

## 2019-05-17 DIAGNOSIS — I25810 Atherosclerosis of coronary artery bypass graft(s) without angina pectoris: Secondary | ICD-10-CM

## 2019-05-17 DIAGNOSIS — R7303 Prediabetes: Secondary | ICD-10-CM | POA: Diagnosis not present

## 2019-05-17 DIAGNOSIS — Z7901 Long term (current) use of anticoagulants: Secondary | ICD-10-CM | POA: Diagnosis not present

## 2019-05-17 DIAGNOSIS — D539 Nutritional anemia, unspecified: Secondary | ICD-10-CM

## 2019-05-17 DIAGNOSIS — N189 Chronic kidney disease, unspecified: Secondary | ICD-10-CM

## 2019-05-17 DIAGNOSIS — R7989 Other specified abnormal findings of blood chemistry: Secondary | ICD-10-CM

## 2019-05-17 DIAGNOSIS — G2581 Restless legs syndrome: Secondary | ICD-10-CM

## 2019-05-17 LAB — COMPREHENSIVE METABOLIC PANEL
ALT: 11 U/L (ref 0–53)
AST: 18 U/L (ref 0–37)
Albumin: 3.7 g/dL (ref 3.5–5.2)
Alkaline Phosphatase: 85 U/L (ref 39–117)
BUN: 29 mg/dL — ABNORMAL HIGH (ref 6–23)
CO2: 29 mEq/L (ref 19–32)
Calcium: 9.3 mg/dL (ref 8.4–10.5)
Chloride: 103 mEq/L (ref 96–112)
Creatinine, Ser: 1.82 mg/dL — ABNORMAL HIGH (ref 0.40–1.50)
GFR: 35.58 mL/min — ABNORMAL LOW (ref >60.00)
Glucose, Bld: 90 mg/dL (ref 70–99)
Potassium: 4.9 mEq/L (ref 3.5–5.1)
Sodium: 139 mEq/L (ref 135–145)
Total Bilirubin: 0.4 mg/dL (ref 0.2–1.2)
Total Protein: 7.1 g/dL (ref 6.0–8.3)

## 2019-05-17 LAB — LIPID PANEL
Cholesterol: 150 mg/dL (ref 0–200)
HDL: 46.1 mg/dL (ref >39.00)
LDL Cholesterol: 83 mg/dL (ref 0–99)
NonHDL: 103.56
Total CHOL/HDL Ratio: 3
Triglycerides: 102 mg/dL (ref 0.0–149.0)
VLDL: 20.4 mg/dL (ref 0.0–40.0)

## 2019-05-17 LAB — CBC
HCT: 37.5 % — ABNORMAL LOW (ref 39.0–52.0)
Hemoglobin: 12.7 g/dL — ABNORMAL LOW (ref 13.0–17.0)
MCHC: 33.8 g/dL (ref 30.0–36.0)
MCV: 93.8 fl (ref 78.0–100.0)
Platelets: 183 10*3/uL (ref 150.0–400.0)
RBC: 4 Mil/uL — ABNORMAL LOW (ref 4.22–5.81)
RDW: 14.6 % (ref 11.5–15.5)
WBC: 5.7 10*3/uL (ref 4.0–10.5)

## 2019-05-17 LAB — TSH: TSH: 4.39 u[IU]/mL (ref 0.35–4.50)

## 2019-05-17 LAB — HEMOGLOBIN A1C: Hgb A1c MFr Bld: 6.1 % (ref 4.6–6.5)

## 2019-05-17 MED ORDER — TRAMADOL HCL 50 MG PO TABS: ORAL_TABLET | ORAL | 1 refills | Status: DC

## 2019-05-17 NOTE — Patient Instructions (Signed)
Good to see you today- I will be in touch with your labs Try to get your covid series asap!    Please see me in 6 months

## 2019-05-26 ENCOUNTER — Telehealth: Payer: Self-pay | Admitting: Cardiovascular Disease

## 2019-05-26 NOTE — Telephone Encounter (Signed)
°  Pt's daughter called requesting to be with pt on his appt on 05/31/19. She said she is pt's care advocate and goes to all of his appt.  Please advise

## 2019-05-26 NOTE — Telephone Encounter (Signed)
Pts daughter [name] (on [place]) is calling to request to accompany the pt to his appt with Dr. Claiborne Billings on 3/17, for he has memory issues, cannot ambulate without assistance, and she is his pt care advocate and manages his medical care and medications.  Informed the pts daughter that she can assist the pt to his appt with Dr. Claiborne Billings and they both need to wear their facial mask to the appt and during the entire duration of the appt. [name] verbalized understanding and agrees with this plan. Updated this in appt notes.  Will send this message to Dr. Evette Georges RN as a general FYI.

## 2019-05-31 ENCOUNTER — Ambulatory Visit: Payer: Medicare Other | Admitting: Cardiovascular Disease

## 2019-05-31 ENCOUNTER — Other Ambulatory Visit: Payer: Self-pay

## 2019-05-31 DIAGNOSIS — I6523 Occlusion and stenosis of bilateral carotid arteries: Secondary | ICD-10-CM | POA: Diagnosis not present

## 2019-05-31 DIAGNOSIS — Z7901 Long term (current) use of anticoagulants: Secondary | ICD-10-CM

## 2019-05-31 DIAGNOSIS — Z79899 Other long term (current) drug therapy: Secondary | ICD-10-CM | POA: Diagnosis not present

## 2019-05-31 DIAGNOSIS — I25810 Atherosclerosis of coronary artery bypass graft(s) without angina pectoris: Secondary | ICD-10-CM | POA: Diagnosis not present

## 2019-05-31 DIAGNOSIS — N184 Chronic kidney disease, stage 4 (severe): Secondary | ICD-10-CM

## 2019-05-31 DIAGNOSIS — I4821 Permanent atrial fibrillation: Secondary | ICD-10-CM | POA: Diagnosis not present

## 2019-05-31 DIAGNOSIS — E785 Hyperlipidemia, unspecified: Secondary | ICD-10-CM

## 2019-05-31 DIAGNOSIS — Z951 Presence of aortocoronary bypass graft: Secondary | ICD-10-CM

## 2019-05-31 MED ORDER — CARVEDILOL 3.125 MG PO TABS: 3.1250 mg | ORAL_TABLET | Freq: Two times a day (BID) | ORAL | 3 refills | Status: DC

## 2019-05-31 MED ORDER — ROSUVASTATIN CALCIUM 40 MG PO TABS: 40.0000 mg | ORAL_TABLET | Freq: Every day | ORAL | 3 refills | Status: DC

## 2019-05-31 MED ORDER — APIXABAN 2.5 MG PO TABS: ORAL_TABLET | ORAL | 1 refills | Status: DC

## 2019-05-31 MED ORDER — LISINOPRIL 5 MG PO TABS: 5.0000 mg | ORAL_TABLET | Freq: Every day | ORAL | 1 refills | Status: DC

## 2019-05-31 MED ORDER — EZETIMIBE 10 MG PO TABS: 10.0000 mg | ORAL_TABLET | Freq: Every day | ORAL | 3 refills | Status: DC

## 2019-05-31 MED ORDER — PANTOPRAZOLE SODIUM 40 MG PO TBEC: 40.0000 mg | DELAYED_RELEASE_TABLET | Freq: Two times a day (BID) | ORAL | 2 refills | Status: DC

## 2019-05-31 NOTE — Patient Instructions (Signed)
Medication Instructions:  BEGIN TAKING ZETIA 10MG  DAILY (1 TABLET DAILY)  *If you need a refill on your cardiac medications before your next appointment, please call your pharmacy*   Lab Work: IN 6 MONTHS FASTING LABS [facility] LIPID  If you have labs (blood work) drawn today and your tests are completely normal, you will receive your results only by: Marland Kitchen MyChart Message (if you have MyChart) OR . A paper copy in the mail If you have any lab test that is abnormal or we need to change your treatment, we will call you to review the results.    Follow-Up: At [facility], you and your health needs are our priority.  As part of our continuing mission to provide you with exceptional heart care, we have created designated Provider Care Teams.  These Care Teams include your primary Cardiologist (physician) and Advanced Practice Providers (APPs -  Physician Assistants and Nurse Practitioners) who all work together to provide you with the care you need, when you need it.  We recommend signing up for the patient portal called "MyChart".  Sign up information is provided on this After Visit Summary.  MyChart is used to connect with patients for Virtual Visits (Telemedicine).  Patients are able to view lab/test results, encounter notes, upcoming appointments, etc.  Non-urgent messages can be sent to your provider as well.   To learn more about what you can do with MyChart, go to [email or web address].    Your next appointment:   6 month(s)  The format for your next appointment:   In Person  Provider:   Shelva Majestic, MD

## 2019-05-31 NOTE — Progress Notes (Signed)
Patient ID: [id number] ARTS, male   DOB: 1932-07-15, 86 y.o.   MRN: [id number]     HPI: Gregory [name] is a 86 y.o. male returns for a 6 month follow-up cardiology evaluation.  Gregory [name] has a history of known CAD in 2003 underwent CABG revascularization surgery by Dr. Servando Snare  (LIMA  to  LAD, vein graft to the obtuse marginal, sequential vein graft to the PDA and PLA of RCA).  His last cardiac catheterization was in 2008 done by Dr. Tami Ribas. At that time, ejection fraction was 20-25%. He declined consideration for ICD. Ejection fraction improved to approximately 35-40% in September 2012 on medical therapy. In the past, he has refused Coumadin therapy and had been maintained on aspirin and Plavix. In 2013 he was hospitalized for appendicitis with abscess requiring laparoscopic appendectomy. He was hospitalized in November 2014 and was felt to have a splenic infarct of embolic etiology. He underwent a transesophageal echocardiogram on 01/23/2013 by Dr. Sallyanne Kuster which revealed an ejection fraction in the range of 15-25%. There was diffuse hypokinesis with severe hypokinesis to akinesis of the anteroseptal apical myocardium. He had increased left ventricular end-diastolic filling pressure and elevated left atrial filling pressure. There was evidence for large solid fixed thrombus occupying the entire atrial appendage with moderate continuous spontaneous echo contrast ("smoke") in the left atrial cavity. There was no evidence for right atrial thrombus. At that time, the patient refused life vest.  He has been on Xarelto for anticoagulation.He denies chest pain. He denies significant shortness of breath. He is unaware of tachdysrhythmias.   He has permanent atrial fibrillation, but his rate has been controlled.  He denies any presyncope or syncope.  He denies any chest pain.  A followup echo Doppler study on 06/14/2013 revealed significant improvement in LV function with an ejection fraction estimated now at  50-55% without regional wall motion abnormalities.  He did have mild aortic root dilatation, mild aortic insufficiency, and mild mitral regurgitation with biatrial enlargement.   He has severe bilateral carotid artery disease.  He had been seen by Dr. Oneida Alar.  His prior carotid duplex scan was significantly abnormal with his left carotid velocities increased to 859 systolic and 292 diastolic with severe fibrous plaque and elevated velocities within all segments of his left internal carotid.  There also was 50-69% diameter reduction in the right bulb/proximal ICA. I referred him to Dr. Gwenlyn Found who subsequently referred him to Dr. Trula Slade.  He denies chest pain.  He denies palpitations.  He denies shortness of breath.  He continues to say he is asymptomatic with reference to his carotid disease and has refused intervention.   A follow-up Doppler study on 08/23/2014 showed increased velocities in the left ar  492 over 148 at the MICA level and to 30/67, at the PICA level.  On the right, his PICA velocity was 292/78.  When compared to his prior study.  This has not increased and has remained fairly stable.  He denies chest pain or palpitations.  He denies paresthesias.  He denies visual symptoms.  When I saw him in follow-up,  he remained asymptomatic.  He has refused to consider any potential carotid intervention or surgery.  He was unaware of any fast heartbeats with his chronic atrial fibrillation.  He denies bleeding.  A carotid study earlier today continued to demonstrate severe stenosis with peak  velocity in the left MICA at 500/122 and PICA 241/34. On the right,PICA was 293/106.  At his last office visit, I  recommended discontinuing of simvastatin in attempt to be more aggressive with lipid lowering and started Crestor 40 mg daily.  He was hospitalized May 2017  after sustaining a renal infarct with acute kidney injury after taking 3 days off Xarelto for pain management injection to his back.  He had  developed mild anemia with a hemoglobin drop of 3 points leading to a subsequent emergency room evaluation.  A CT of his abdomen and pelvis did not show acute findings or signs of peritoneal bleeding.  He denied any episodes of chest pain,  paresthesias, presyncope or syncope. Or bleeding  An echo Doppler study in May 2017 showed an EF of 55-60%.  There was mild aortic insufficiency, mild aortic root dilatation, mitral annular calcification with mild MR.   On a subsequent office visit he began to consider the possibility of maybe in the future thinking about carotid surgery, but he was not ready yet to have this done.  His last carotid study in September 2017 showed left carotid peak systolic velocity of 301 and peak diastolic velocity 601, consistent with a least 80% stenosis.  On the right peak systolic velocity was 093 and diastolic velocity was 85.  He is followed by Dr. Posey Pronto for his chronic kidney disease.  When I saw him in July 2018 he was feeling well and denied any episodes of chest pain or shortness of breath.  He denied any neurologic symptoms.  He denied paresthesias or dizziness.  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 At that time he was still against consideration for carotid surgery.  He was on aggressive lipid-lowering therapy with target LDL less than 70 and potential induce plaque regression.  He was seen by Almyra Deforest, PAC in June 2019.  He was on Eliquis without bleeding he was hospitalized from July 2 through July 6 with acute decompensated heart failure complicated by acute hypoxic respiratory failure.  An echo Doppler study showed an EF of 25 to 30% with diffuse hypokinesis which had significantly decreased from 50 to 60% in 2017.  He underwent repeat catheterization which showed occlusion of his native vessels with a patent LIMA to the LAD, a patent vein graft was on 2, and subchondral vein graft to the PDA/PLA.  He was seen by Lamar Blinks in follow-up on September 29, 2017.  At that time he was feeling well and denied significant shortness of breath and was on lisinopril 5 mg, furosemide 40 mg daily in addition to carvedilol 3.125 mg twice a day.  He is anticoagulated on Eliquis 2.5 mg twice a day.  He is on rosuvastatin 40 mg.  For restless legs he also has been on Requip.   I saw him in August 2019.  At that time he was doing well and I discussed potential transition to [facility] depending upon improvement in his renal function.  He is followed by Dr. Posey Pronto for his kidney disease.  Creatinine on December 01, 2017 was 2.46.  He denies recurrent chest pain or bleeding.  He is followed by Dr. Oneida Alar since continues to have no interest in pursuing carotid revascularization.    I last saw him in March 2020 at which time he was continuing to do well and remains very active.    He sees Dr. Posey Pronto who follows his chronic kidney disease.    I last saw him in September 2020 at which time he denied any episodes of dizziness, paresthesias and continued to remain active.  In  April he cut his finger on a table saw.  He works every day in the garden and the week prior to his office visit he built a fence in his yard.  He feels that he has not had any reduction in his energy level and denies any exertional shortness of breath or chest pain.  During that evaluation his blood pressure was stable on lisinopril 5 mg, carvedilol 3.125 mg twice a day, and furosemide 40 mg.  He continues to be on rosuvastatin 40 mg for aggressive lipid management and attempt to induce plaque regression.  There was no bleeding on Eliquis.  Over the past 6 months, Gregory [name] continues to be asymptomatic.  He sees Dr. Posey Pronto for insufficiency and creatinine on May 17, 2019 was 1.82.  Lipid studies revealed an LDL cholesterol of 83.  He denies chest pain PND orthopnea presyncope or syncope.  He presents for evaluation.  Past Medical History:  Diagnosis Date  . Acute respiratory failure ([place])   .  Appendicitis with abscess 03/31/2011  . Arthritis   . CAD (coronary artery disease),CABG 1993-LIMA to the LAD, SVG to Om and SVG to PDA/PLA, patent on cath 2008. 2003   a. s/p CABG 2003. b. last cath 2008 with patent grafts.  . Chronic atrial fibrillation ([facility])    permanent  . Chronic systolic CHF (congestive heart failure) ([place])    a. Prior low EF, later normalized.  . Essential hypertension   . GERD (gastroesophageal reflux disease)   . Heart murmur   . Hyperlipidemia   . Ischemic cardiomyopathy   . Kidney stones    "passed all but one time when he had to have lithotripsy" (01/21/2013)  . Pulmonary hypertension ([place])   . 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 07/2015 - after holding Xarelto x 3 days for spinal procedure.  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 SSS (sick sinus syndrome) ([place])   . Stroke [facility]) 2007   "slightly drags left foot since; recovered qthing else" (01/21/2013)  . Syncope 07/2015   a. felt vasovagal in setting of pain from renal infarct.    Past Surgical History:  Procedure Laterality Date  . BALLOON DILATION N/A 05/22/2015   Procedure: BALLOON DILATION;  Surgeon: Gatha Mayer, MD;  Location: [facility] ENDOSCOPY;  Service: Endoscopy;  Laterality: N/A;  . BALLOON DILATION N/A 12/15/2018   Procedure: BALLOON DILATION;  Surgeon: Gatha Mayer, MD;  Location: [facility] ENDOSCOPY;  Service: Endoscopy;  Laterality: N/A;  . CARDIAC CATHETERIZATION  02/24/2002   reduced LV function, 60-70% prox RCA stenosis, 70% PLA ostial stenosis, 80% secondary branch of PLA stenosis - subsequent CABG (Dr. Jackie Plum)  . Carotid Doppler  06/2012   50-69% right bulb/prox ICA diameter reduction; 70-99% left bulb/prox ICA diameter reduction  . CATARACT EXTRACTION W/ INTRAOCULAR LENS IMPLANT Bilateral 2013  . COLONOSCOPY N/A 05/18/2016   Procedure: COLONOSCOPY;  Surgeon: Manus Gunning, MD;  Location: [facility] ENDOSCOPY;  Service: Gastroenterology;  Laterality: N/A;  . CORONARY ANGIOPLASTY  07/05/2006   3 vessel CAD,  patent LIMA to LAD, patentVG to OM, patent SVG to PDA & PLA, mild MR, severe LV systolic dysfunction (Dr. Gerrie Nordmann)  . CORONARY ARTERY BYPASS GRAFT  03/01/2002   LIMA to LAD, reverse SVG to OM, reverse SVG to PDA of RCA, reverse SVG to PLA of RCA, ligation of LA appendage (Dr. Servando Snare)  . ENTEROSCOPY N/A 05/20/2016   Procedure: ENTEROSCOPY;  Surgeon: Manus Gunning, MD;  Location: [place];  Service:  Gastroenterology;  Laterality: N/A;  . ESOPHAGOGASTRODUODENOSCOPY  02/01/2012   Procedure: ESOPHAGOGASTRODUODENOSCOPY (EGD);  Surgeon: Beryle Beams, MD;  Location: [facility];  Service: Endoscopy;  Laterality: N/A;  . ESOPHAGOGASTRODUODENOSCOPY N/A 05/22/2015   Procedure: ESOPHAGOGASTRODUODENOSCOPY (EGD);  Surgeon: Gatha Mayer, MD;  Location: [facility];  Service: Endoscopy;  Laterality: N/A;  . ESOPHAGOGASTRODUODENOSCOPY N/A 05/17/2016   Procedure: ESOPHAGOGASTRODUODENOSCOPY (EGD);  Surgeon: Juanita Craver, MD;  Location[facility] END[facility]COPY;  Service: Endoscopy;  Laterality: N/A;  . ESOPHAGOGASTRODUODENOSCOPY N/A 09/17/2017   Procedure: ESOPHAGOGASTRODUODENOSCOPY (EGD);  Surgeon: Milus Banister, MD;  Location: [facility] ENDOSCOPY;  Service: Endoscopy;  Laterality: N/A;  . ESOPHAGOGASTRODUODENOSCOPY (EGD) WITH PROPOFOL N/A 12/15/2018   Procedure: ESOPHAGOGASTRODUODENOSCOPY (EGD) WITH PROPOFOL;  Surgeon: Gatha Mayer, MD;  Location: [facility] ENDOSCOPY;  Service: Endoscopy;  Laterality: N/A;  . FOREIGN BODY REMOVAL  09/17/2017   Procedure: FOREIGN BODY REMOVAL;  Surgeon: Milus Banister, MD;  Location: [facility] ENDOSCOPY;  Service: Endoscopy;;  . FOREIGN BODY REMOVAL  12/15/2018   Procedure: FOREIGN BODY REMOVAL;  Surgeon: Gatha Mayer, MD;  Location: [facility] ENDOSCOPY;  Service: Endoscopy;;  . GIVENS CAPSULE STUDY N/A 05/18/2016   Procedure: GIVENS CAPSULE STUDY;  Surgeon: Manus Gunning, MD;  Location: [place];  Service: Gastroenterology;  Laterality: N/A;  . LAPAROSCOPIC APPENDECTOMY  03/30/2011    Procedure: APPENDECTOMY LAPAROSCOPIC;  Surgeon: Joyice Faster. Cornett, MD;  Location: [place];  Service: General;  Laterality: N/A;  . LEFT HEART CATHETERIZATION WITH CORONARY ANGIOGRAM N/A 01/24/2013   Procedure: LEFT HEART CATHETERIZATION WITH CORONARY ANGIOGRAM;  Surgeon: [name], MD;  Location: [facility] CATH LAB;  Service: Cardiovascular;  Laterality: N/A;  Right heart with grafts  . LITHOTRIPSY     "once" (01/21/2013)  . NM MYOCAR PERF WALL MOTION  05/2009   persantine myoview - fixed moderate perfusion defect in inferior wall & lateral segment of apex (poor non-transmural infarction), minimal anterolateral periinfarct reversible ischemia seen, abnormal study, defects similar to 2006 study  . RIGHT/LEFT HEART CATH AND CORONARY ANGIOGRAPHY N/A 09/17/2017   Procedure: RIGHT/LEFT HEART CATH AND CORONARY ANGIOGRAPHY;  Surgeon: Martinique, Peter M, MD;  Location: [facility] CV LAB;  Service: Cardiovascular;  Laterality: N/A;  . SPLENECTOMY, TOTAL  01/2013  . TEE WITHOUT CARDIOVERSION N/A 01/23/2013   Procedure: TRANSESOPHAGEAL ECHOCARDIOGRAM (TEE);  Surgeon: Sanda Klein, MD;  Location: [facility] ENDOSCOPY;  Service: Cardiovascular;  Laterality: N/A;  . TRANSTHORACIC ECHOCARDIOGRAM  06/23/2012   EF 40-45%, mild LVH; mild AV regurg; mild MV regurg; LV mod-severely dilated; RV mildly dilated; systolic pressure borderline increased; RA mod dilated    Allergies  Allergen Reactions  . Percocet [Oxycodone-Acetaminophen] Itching and Nausea Only    Current Outpatient Medications  Medication Sig Dispense Refill  . apixaban (ELIQUIS) 2.5 MG TABS tablet TAKE 1 TABLET(2.5 MG) BY MOUTH TWICE DAILY 180 tablet 1  . carvedilol (COREG) 3.125 MG tablet Take 1 tablet (3.125 mg total) by mouth 2 (two) times daily with a meal. TAKE 1 TABLET(3.125 MG) BY MOUTH TWICE DAILY WITH A MEAL 180 tablet 3  . levothyroxine (SYNTHROID) 25 MCG tablet Take 1 tablet (25 mcg total) by mouth daily before breakfast. 30 tablet 6  . lisinopril  (ZESTRIL) 5 MG tablet Take 1 tablet (5 mg total) by mouth daily. 90 tablet 1  . pantoprazole (PROTONIX) 40 MG tablet Take 1 tablet (40 mg total) by mouth 2 (two) times daily. 180 tablet 2  . rOPINIRole (REQUIP) 0.25 MG tablet TAKE 1 TABLET(0.25 MG) BY MOUTH TWICE DAILY (  Patient taking differently: Take 0.25 mg by mouth 2 (two) times daily. TAKE 1 TABLET(0.25 MG) BY MOUTH TWICE DAILY) 180 tablet 3  . rosuvastatin (CRESTOR) 40 MG tablet Take 1 tablet (40 mg total) by mouth daily. TAKE 1 TABLET(40 MG) BY MOUTH DAILY 90 tablet 3  . traMADol (ULTRAM) 50 MG tablet TAKE ONE-HALF TABLET BY MOUTH EVERY 8 HOURS AS NEEDED FOR PAIN 30 tablet 1  . ezetimibe (ZETIA) 10 MG tablet Take 1 tablet (10 mg total) by mouth daily. 90 tablet 3   No current facility-administered medications for this visit.    Social History   Socioeconomic History  . Marital status: Married    Spouse name: Not on file  . Number of children: 9  . Years of education: 8  . Highest education level: Not on file  Occupational History  . Occupation: retired  Tobacco Use  . Smoking status: Former Smoker    Packs/day: 2.00    Years: 20.00    Pack years: 40.00    Types: Cigarettes    Quit date: 04/08/1986    Years since quitting: 33.1  . Smokeless tobacco: Current User    Types: Chew  Substance and Sexual Activity  . Alcohol use: No    Alcohol/week: 0.0 standard drinks  . Drug use: No  . Sexual activity: Not on file  Other Topics Concern  . Not on file  Social History Narrative    married, 5 sons 4 daughters. He is retired Horticulturist, commercial. 2-3 caffeinated beverages a day no alcohol   Social Determinants of Health   Financial Resource Strain:   . Difficulty of Paying Living Expenses:   Food Insecurity:   . Worried About Charity fundraiser in the Last Year:   . Arboriculturist in the Last Year:   Transportation Needs:   . Film/video editor (Medical):   Marland Kitchen Lack of Transportation (Non-Medical):   Physical Activity:   .  Days of Exercise per Week:   . Minutes of Exercise per Session:   Stress:   . Feeling of Stress :   Social Connections:   . Frequency of Communication with Friends and Family:   . Frequency of Social Gatherings with Friends and Family:   . Attends Religious Services:   . Active Member of Clubs or Organizations:   . Attends Archivist Meetings:   Marland Kitchen Marital Status:   Intimate Partner Violence:   . Fear of Current or Ex-Partner:   . Emotionally Abused:   Marland Kitchen Physically Abused:   . Sexually Abused:     Family History  Problem Relation Age of Onset  . Heart disease Father        rheumatic fever  . Heart attack Brother 90  . Stroke Mother   . Stroke Brother 68   ROS General: Negative; No fevers, chills, or night sweats;  HEENT: Negative; No changes in vision or hearing, sinus congestion, difficulty swallowing Pulmonary: Negative; No cough, wheezing, shortness of breath, hemoptysis Cardiovascular: See history of present illness GI: positive for GERD; No nausea, vomiting, diarrhea, or abdominal pain GU: Renal infarct Musculoskeletal: Negative; no myalgias, joint pain, or weakness Hematologic/Oncology: Negative; no easy bruising, bleeding Endocrine: Negative; no heat/cold intolerance; no diabetes Neuro: positive for remote CVA Skin: Negative; No rashes or skin lesions Psychiatric: Negative; No behavioral problems, depression Sleep: Positive for restless legs; no snoring, daytime sleepiness, hypersomnolence, bruxism, , hypnogognic hallucinations, no cataplexy Other comprehensive 14 point system review is negative.  PE BP 118/60 (BP Location: Right Arm, Patient Position: Sitting, Cuff Size: Normal)   Pulse 72   Temp (!) 97.2 F (36.2 C)   Ht 5' 6.5" (1.689 m)   Wt 193 lb (87.5 kg)   BMI 30.68 kg/m    Repeat blood pressure by me was 118/66  Wt Readings from Last 3 Encounters:  05/31/19 193 lb (87.5 kg)  05/17/19 190 lb (86.2 kg)  01/26/19 185 lb 6.4 oz (84.1 kg)    General: Alert, oriented, no distress.  Skin: normal turgor, no rashes, warm and dry HEENT: Normocephalic, atraumatic. Pupils equal round and reactive to light; sclera anicteric; extraocular muscles intact;  Nose without nasal septal hypertrophy Mouth/Parynx benign; Mallinpatti scale 3 Neck: No JVD,  carotid bruits; normal carotid upstroke Lungs: clear to ausculatation and percussion; no wheezing or rales Chest wall: without tenderness to palpitation Heart: PMI not displaced, irregularly irregular rhythm in the 70s, s1 s2 normal, 1/6 systolic murmur, no diastolic murmur, no rubs, gallops, thrills, or heaves Abdomen: soft, nontender; no hepatosplenomehaly, BS+; abdominal aorta nontender and not dilated by palpation. Back: no CVA tenderness Pulses 2+ Musculoskeletal: full range of motion, normal strength, no joint deformities Extremities: no clubbing cyanosis or edema, Homan's sign negative  Neurologic: grossly nonfocal; Cranial nerves grossly wnl Psychologic: Normal mood and affect  ECG (independently read by me): Atrial fibrillation at 72 bpm, isolated PVC.  QRS voltage.  Inferior Q waves.  QS complex V1 through V6.  September 2020 ECG (independently read by me): Atrial fibrillation with ventricular rate at 50 bpm.  QS complex V1 through V4  March 2020 ECG (independently read by me): Atrial fibrillation at 66 bpm.  Right axis deviation.  Poor anterior R wave progression V1 through V5.  DC interval for 36 ms.  January 13, 2018 ECG (independently read by me): Atrial fibrillation at 65 bpm.  Inferior Q waves.  Poor anterior R wave progression V1 through V4  August 2019 ECG (independently read by me): AF vs junctional at 11; old anterolateral MI; QTc 497 msec  July 2018 ECG (independently read by me): Atrial fibrillation at 60 bpm.  Low voltage.  Probable old anterolateral MI with poor anterior R-wave progression.  Small inferior Q waves.  December 2017 ECG (independently read by me):  Atrial fibrillation with a rate at 58 bpm.  QS complex anteriorly.  Low voltage.  September 2017 ECG (independently read by me): Atrial fibrillation with premature ventricular beats.  Poor R wave progression anteriorly.  Small nondiagnostic inferior Q waves.  December 2016 ECG (independently read by me): atrial fibrillation with rate in the 60s with occasional PVC.  QTc interval 452 ms  August 2016 ECG (independently read by me): Atrial fibrillation at 67 bpm.  2 ventricular premature beats.  Poor R-wave progression  May 2016 ECG (independently read by me): Atrial fibrillation with occasional PVCs.  Heart rate 72.  ECG (independently read by me): Atrial fibrillation with controlled ventricular response in the 70s.  Poor anterior R-wave progression.  Prior April 2015 ECG (independently read by me): Atrial fibrillation at 56 beats per minute.  QTc interval 395 ms  Prior 02/23/2013 ECG: Atrial fibrillation at 80 beats per minute with isolated PVC.  LABS: BMP Latest Ref Rng & Units 05/17/2019 12/07/2018 05/18/2018  Glucose 70 - 99 mg/dL 90 90 81  BUN 6 - 23 mg/dL 29(H) 28(H) 20  Creatinine 0.40 - 1.50 mg/dL 1.82(H) 1.82(H) 1.74(H)  BUN/Creat Ratio 10 - 24 - - 11  Sodium 135 -  145 mEq/L 139 140 144  Potassium 3.5 - 5.1 mEq/L 4.9 4.2 4.6  Chloride 96 - 112 mEq/L 103 106 103  CO2 19 - 32 mEq/L '29 26 24  '$ Calcium 8.4 - 10.5 mg/dL 9.3 8.8 9.4   Hepatic Function Latest Ref Rng & Units 05/17/2019 05/18/2018 12/01/2017  Total Protein 6.0 - 8.3 g/dL 7.1 7.0 7.3  Albumin 3.5 - 5.2 g/dL 3.7 3.8 3.8  AST 0 - 37 U/L '18 23 18  '$ ALT 0 - 53 U/L '11 15 11  '$ Alk Phosphatase 39 - 117 U/L 85 94 80  Total Bilirubin 0.2 - 1.2 mg/dL 0.4 0.4 0.5  Bilirubin, Direct 0.0 - 0.3 mg/dL - - -   CBC Latest Ref Rng & Units 05/17/2019 12/07/2018 05/18/2018  WBC 4.0 - 10.5 K/uL 5.7 7.1 6.2  Hemoglobin 13.0 - 17.0 g/dL 12.7(L) 12.0(L) 12.6(L)  Hematocrit 39.0 - 52.0 % 37.5(L) 35.7(L) 36.9(L)  Platelets 150.0 - 400.0 K/uL 183.0 180.0  186   Lab Results  Component Value Date   MCV 93.8 05/17/2019   MCV 96.5 12/07/2018   MCV 90 05/18/2018   Lab Results  Component Value Date   TSH 4.39 05/17/2019   Lab Results  Component Value Date   HGBA1C 6.1 05/17/2019   Lipid Panel     Component Value Date/Time   CHOL 150 05/17/2019 1319   CHOL 136 05/18/2018 1355   TRIG 102.0 05/17/2019 1319   HDL 46.10 05/17/2019 1319   HDL 56 05/18/2018 1355   CHOLHDL 3 05/17/2019 1319   VLDL 20.4 05/17/2019 1319   LDLCALC 83 05/17/2019 1319   LDLCALC 69 05/18/2018 1355     IMPRESSION:  1. Coronary artery disease involving coronary bypass graft of native heart without angina pectoris   2. Hx of CABG   3. Permanent atrial fibrillation ([place])   4. Medication management   5. Bilateral carotid artery stenosis   6. Anticoagulation adequate   7. Hyperlipidemia with target LDL less than 70   8. CKD (chronic kidney disease), stage 3b/4 [facility])     ASSESSMENT AND PLAN: Gregory [name] is an 86 year old gentleman who is status post CABG revascularization surgery in 2003. He has a history of an  ischemic cardiomyopathy with an initial ejection fraction of 25% with subsequent normalization at 55-60%; however, on his last echo Doppler study during his July 2019 hospitalization EF was again 25 to 30%.  There was mild AR, mild to moderate MR, biatrial enlargement and mild TR.  He had akinesis of the distal anteroseptal wall and apex.  He has a remote history of a small right brain CVA in 2005  with subsequent recovery. 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  In the past he was documented to have prior thrombus in his left atrium and had been on Xarelto.  Presently he is on a reduced dose of Eliquis in light of his age and renal dysfunction.  He has documented high-grade carotid disease for which he sees Dr. Oneida Alar and has no interest in pursuing surgery or stenting.  He continues  to be extremely active and does significant yard work.  He remains asymptomatic.  His blood pressure today is stable and on repeat by me was 118/66 on his regimen consisting of carvedilol 3.125 mg twice a day and lisinopril 5 mg daily.  I reviewed recent laboratory.  He has stage IIIb chronic kidney disease with estimated GFR of 35.6.  Lipid studies have shown an LDL cholesterol of 83 despite taking rosuvastatin 40 mg.  I am adding Zetia 10 mg for more aggressive lipid management in attempt to further reduce plaque regression.  He will be following up with Dr. Posey Pronto for nephrology reassessment.  He sees Dr. Janett Billow [name] for primary care.  6 months I have recommended a complete set of fasting laboratory and I will see her back in the office for reevaluation.    He continues to be asymptomatic.  His blood pressure today is stable on lisinopril 5 mg, carvedilol 3.125 mg twice a day and furosemide 40 mg.  He is now on rosuvastatin 40 mg and an aggressive attempt to induce plaque regression with target LDL less than 70.  There is no bleeding on low-dose Eliquis.  I reviewed his most recent laboratory from 6 months ago.  At that time hemoglobin A1c was 6.1.  Creatinine had improved to 1.74.  Hemoglobin and hematocrit were stable.  Lipid studies were excellent with LDL cholesterol at 69 on his current regimen.  As long as he remains stable he will continue his current regimen and I will see him in 6 months for reevaluation.  Time spent: 25 minutes  Troy Sine, MD, [facility]  06/02/2019 5:32 PM

## 2019-06-02 ENCOUNTER — Encounter: Payer: Self-pay | Admitting: Cardiovascular Disease

## 2019-07-05 ENCOUNTER — Other Ambulatory Visit: Payer: Self-pay | Admitting: Cardiovascular Disease

## 2019-07-05 MED ORDER — ROSUVASTATIN CALCIUM 40 MG PO TABS: 40.0000 mg | ORAL_TABLET | Freq: Every day | ORAL | 1 refills | Status: DC

## 2019-07-05 NOTE — Telephone Encounter (Signed)
*  STAT* If patient is at the pharmacy, call can be transferred to refill team.   1. Which medications need to be refilled? (please list name of each medication and dose if known)  rosuvastatin (CRESTOR) 40 MG tablet  2. Which pharmacy/location (including street and city if local pharmacy) is medication to be sent to?   [facility], [place] - [address] AT [place]  3. Do they need a 30 day or 90 day supply? 90 with refills  Pt is out of medication   The pt was in to see Dr. Claiborne Billings in March and this was the only one that did not get refilled.

## 2019-07-06 ENCOUNTER — Other Ambulatory Visit: Payer: Self-pay

## 2019-07-06 DIAGNOSIS — E039 Hypothyroidism, unspecified: Secondary | ICD-10-CM

## 2019-07-06 MED ORDER — LEVOTHYROXINE SODIUM 25 MCG PO TABS: 25.0000 ug | ORAL_TABLET | Freq: Every day | ORAL | 1 refills | Status: DC

## 2019-07-08 ENCOUNTER — Other Ambulatory Visit: Payer: Self-pay | Admitting: Cardiovascular Disease

## 2019-08-01 ENCOUNTER — Telehealth: Payer: Self-pay | Admitting: Family Medicine

## 2019-08-01 NOTE — Progress Notes (Signed)
  Chronic Care Management   Outreach Note  08/01/2019 Name: CHRISTPOHER SIEVERS MRN: [id number] DOB: [birth year]  Referred by: Darreld Mclean, MD Reason for referral : No chief complaint on file.   An unsuccessful telephone outreach was attempted today. The patient was referred to the pharmacist for assistance with care management and care coordination.   This note is not being shared with the patient for the following reason: To respect privacy (The patient or proxy has requested that the information not be shared).  Follow Up Plan:   Earney [place] Upstream Scheduler

## 2019-08-02 DIAGNOSIS — N183 Chronic kidney disease, stage 3 unspecified: Secondary | ICD-10-CM | POA: Diagnosis not present

## 2019-08-09 ENCOUNTER — Telehealth: Payer: Self-pay

## 2019-08-09 DIAGNOSIS — I129 Hypertensive chronic kidney disease with stage 1 through stage 4 chronic kidney disease, or unspecified chronic kidney disease: Secondary | ICD-10-CM | POA: Diagnosis not present

## 2019-08-09 DIAGNOSIS — E039 Hypothyroidism, unspecified: Secondary | ICD-10-CM

## 2019-08-09 DIAGNOSIS — N183 Chronic kidney disease, stage 3 unspecified: Secondary | ICD-10-CM | POA: Diagnosis not present

## 2019-08-09 DIAGNOSIS — N2581 Secondary hyperparathyroidism of renal origin: Secondary | ICD-10-CM | POA: Diagnosis not present

## 2019-08-09 DIAGNOSIS — D631 Anemia in chronic kidney disease: Secondary | ICD-10-CM | POA: Diagnosis not present

## 2019-08-09 MED ORDER — LEVOTHYROXINE SODIUM 25 MCG PO TABS: 25.0000 ug | ORAL_TABLET | Freq: Every day | ORAL | 1 refills | Status: DC

## 2019-08-09 NOTE — Telephone Encounter (Signed)
Medication sent in. 

## 2019-08-09 NOTE — Telephone Encounter (Signed)
Pt's Daughter, [name], called requesting a refill for pt on his Synthroid.  [facility] did not fill last script for 90 days  that Dr. Lorelei Pont sent in last month.  Pt is currently out of this medication.  Please send into [facility] at [facility]   levothyroxine (SYNTHROID) 25 MCG tablet 90 tablet 1 07/06/2019    Sig - Route: Take 1 tablet (25 mcg total) by mouth daily before breakfast. - Oral   Sent to pharmacy as: levothyroxine (SYNTHROID) 25 MCG tablet   E-Prescribing Status: Receipt confirmed by pharmacy (07/06/2019 9:54 AM EDT)   Associated Diagnoses  Acquired hypothyroidism     Pharmacy  [facility], [place] - [address] AT [place]

## 2019-08-21 DIAGNOSIS — M170 Bilateral primary osteoarthritis of knee: Secondary | ICD-10-CM | POA: Diagnosis not present

## 2019-09-04 ENCOUNTER — Other Ambulatory Visit: Payer: Self-pay

## 2019-09-04 DIAGNOSIS — G2581 Restless legs syndrome: Secondary | ICD-10-CM

## 2019-09-04 NOTE — Telephone Encounter (Signed)
Patient called in to get a prescription refill for rOPINIRole (REQUIP) 0.25 MG tablet [[id number]]    Please send it to [facility], [address] AT [address]  [place], [place] [address]  Phone:  [phone] Fax:  [phone]  DEA #:  [id number]

## 2019-09-05 MED ORDER — ROPINIROLE HCL 0.25 MG PO TABS: ORAL_TABLET | ORAL | 3 refills | Status: DC

## 2019-09-05 NOTE — Telephone Encounter (Signed)
Rx sent 

## 2019-09-25 DIAGNOSIS — M545 Low back pain: Secondary | ICD-10-CM | POA: Diagnosis not present

## 2019-09-25 DIAGNOSIS — M47817 Spondylosis without myelopathy or radiculopathy, lumbosacral region: Secondary | ICD-10-CM | POA: Diagnosis not present

## 2019-09-25 DIAGNOSIS — M48061 Spinal stenosis, lumbar region without neurogenic claudication: Secondary | ICD-10-CM | POA: Diagnosis not present

## 2019-09-28 DIAGNOSIS — M47817 Spondylosis without myelopathy or radiculopathy, lumbosacral region: Secondary | ICD-10-CM | POA: Diagnosis not present

## 2019-09-28 DIAGNOSIS — M48061 Spinal stenosis, lumbar region without neurogenic claudication: Secondary | ICD-10-CM | POA: Diagnosis not present

## 2019-09-28 DIAGNOSIS — M545 Low back pain: Secondary | ICD-10-CM | POA: Diagnosis not present

## 2019-10-19 DIAGNOSIS — M545 Low back pain: Secondary | ICD-10-CM | POA: Diagnosis not present

## 2019-10-19 DIAGNOSIS — M47817 Spondylosis without myelopathy or radiculopathy, lumbosacral region: Secondary | ICD-10-CM | POA: Diagnosis not present

## 2019-10-19 DIAGNOSIS — M48061 Spinal stenosis, lumbar region without neurogenic claudication: Secondary | ICD-10-CM | POA: Diagnosis not present

## 2019-11-01 ENCOUNTER — Other Ambulatory Visit: Payer: Self-pay | Admitting: Family Medicine

## 2019-11-01 NOTE — Telephone Encounter (Signed)
Medication:  traMADol (ULTRAM) 50 MG tablet [[id number]]      Has the patient contacted their pharmacy?  (If no, request that the patient contact the pharmacy for the refill.) (If yes, when and what did the pharmacy advise?)     Preferred Pharmacy (with phone number or street name):  [facility], [place] - [address] AT [address]  [place], [place] [address]  Phone:  [phone] Fax:  [phone]     Agent: Please be advised that RX refills may take up to 3 business days. We ask that you follow-up with your pharmacy.

## 2019-11-02 MED ORDER — TRAMADOL HCL 50 MG PO TABS: ORAL_TABLET | ORAL | 1 refills | Status: DC

## 2019-11-02 NOTE — Telephone Encounter (Signed)
Requesting: Tramadol 50 mg tablet Contract: none, needs csc [id number], needs uds Last Visit: 05/17/2019 Next Visit:11/22/2019 Last Refill: 05/17/2019 with one additional refill  Please Advise

## 2019-11-09 DIAGNOSIS — M545 Low back pain: Secondary | ICD-10-CM | POA: Diagnosis not present

## 2019-11-09 DIAGNOSIS — M48061 Spinal stenosis, lumbar region without neurogenic claudication: Secondary | ICD-10-CM | POA: Diagnosis not present

## 2019-11-21 NOTE — Progress Notes (Addendum)
[facility] at [facility] [place], [place], [place] [address] [phone]  Date:  11/22/2019   Name:  Gregory [name]   DOB:  [birth year]   MRN:  [id number]  PCP:  Darreld Mclean, MD    Chief Complaint: Hypothyroidism (6 month follow up) and Coronary Artery Disease   History of Present Illness:  Gregory [name] is a 86 y.o. very pleasant male patient who presents with the following:  Patient with history of CAD status post CABG 2003, A. fib, cardiomyopathy, CVA 2005, PVD, carotid stenosis, renal infarct 2017 during temporary Xarelto hold, esophageal stricture, hypothyroidism, chronic renal insufficiency, prediabetes, chronic anemia, secondary hyperparathyroidism-here today for 81-month follow-up visit  Married to [place], his daughter [name] is also very involved Last seen by myself in March of this year  Nephrologist is Dr. Arthur Holms looks like most recent visit was in October- annual visit  Cardiologist Dr. Claiborne Billings; most recent visit in March.  At that time he was stable on antihypertensives and Crestor 40, low-dose Eliquis He is currently being seen by [facility] for some pain in his buttock and posterior leg They are doing some steroid injections- these are helping him some He is no longer using crutches/ cane- most recent shot 2 weeks ago, does seem to be helping He is also on gabapentin at bedtime  COVID series- not done yet as he is getting these epidural steroid injections. They will do asap  Flu shot- we don't have senior dose, will get done at drug store Colonoscopy up-to-date CMP, CBC, lipids, A1c, TSH in March Patient Active Problem List   Diagnosis Date Noted  . GERD with stricture 01/26/2019  . Hypothyroid 12/12/2018  . Prediabetes 12/04/2018  . Esophageal obstruction due to food impaction   . Chronic anemia 01/13/2016  . Chronic renal failure, stage 3 (moderate) ([place]) 08/06/2015  . Vasovagal  syncope   . 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 Syncope 07/30/2015  . Esophageal stricture   . Carotid stenosis 07/13/2013  . Anticoagulated on Eliquis 03/21/2013  . PVD - 02% LICA, 63% RICA by doppler 5/14 01/24/2013  . 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 CAD (coronary artery disease),CABG 1993-LIMA to the LAD, SVG to Om and SVG to PDA/PLA, patent on cath 2014. 03/31/2011  . Chronic a-fib ([place]) 03/31/2011  . Cardiomyopathy, ischemic, improved 03/31/2011  . CVA (cerebral vascular accident) ([place]) 03/31/2011  . Renal calculi 03/31/2011  . History of tobacco use, continues with chewing tobacco 03/31/2011    Past Medical History:  Diagnosis Date  . Acute respiratory failure ([place])   . Appendicitis with abscess 03/31/2011  . Arthritis   . CAD (coronary artery disease),CABG 1993-LIMA to the LAD, SVG to Om and SVG to PDA/PLA, patent on cath 2008. 2003   a. s/p CABG 2003. b. last cath 2008 with patent grafts.  . Chronic atrial fibrillation (HCC)    permanent  . Chronic systolic CHF (congestive heart failure) ([place])    a. Prior low EF, later normalized.  . Essential hypertension   . GERD (gastroesophageal reflux disease)   . Heart murmur   . Hyperlipidemia   . Ischemic cardiomyopathy   . Kidney stones    "passed all but one time when he had to have lithotripsy" (01/21/2013)  . Pulmonary hypertension ([place])   . Renal infarct ([place])    a. 07/2015 - after holding Xarelto  x 3 days for spinal procedure.  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 SSS (sick sinus syndrome) ([place])   . Stroke [facility]) 2007   "slightly drags left foot since; recovered qthing else" (01/21/2013)  . Syncope 07/2015   a. felt vasovagal in setting of pain from renal infarct.    Past Surgical History:  Procedure Laterality Date  . BALLOON DILATION N/A 05/22/2015   Procedure: BALLOON DILATION;  Surgeon: Gatha Mayer, MD;  Location: [facility] ENDOSCOPY;  Service: Endoscopy;   Laterality: N/A;  . BALLOON DILATION N/A 12/15/2018   Procedure: BALLOON DILATION;  Surgeon: Gatha Mayer, MD;  Location: [facility] ENDOSCOPY;  Service: Endoscopy;  Laterality: N/A;  . CARDIAC CATHETERIZATION  02/24/2002   reduced LV function, 60-70% prox RCA stenosis, 70% PLA ostial stenosis, 80% secondary branch of PLA stenosis - subsequent CABG (Dr. Jackie Plum)  . Carotid Doppler  06/2012   50-69% right bulb/prox ICA diameter reduction; 70-99% left bulb/prox ICA diameter reduction  . CATARACT EXTRACTION W/ INTRAOCULAR LENS IMPLANT Bilateral 2013  . COLONOSCOPY N/A 05/18/2016   Procedure: COLONOSCOPY;  Surgeon: Manus Gunning, MD;  Location: [facility] ENDOSCOPY;  Service: Gastroenterology;  Laterality: N/A;  . CORONARY ANGIOPLASTY  07/05/2006   3 vessel CAD, patent LIMA to LAD, patentVG to OM, patent SVG to PDA & PLA, mild MR, severe LV systolic dysfunction (Dr. Gerrie Nordmann)  . CORONARY ARTERY BYPASS GRAFT  03/01/2002   LIMA to LAD, reverse SVG to OM, reverse SVG to PDA of RCA, reverse SVG to PLA of RCA, ligation of LA appendage (Dr. Servando Snare)  . ENTEROSCOPY N/A 05/20/2016   Procedure: ENTEROSCOPY;  Surgeon: Manus Gunning, MD;  Location: [facility];  Service: Gastroenterology;  Laterality: N/A;  . ESOPHAGOGASTRODUODENOSCOPY  02/01/2012   Procedure: ESOPHAGOGASTRODUODENOSCOPY (EGD);  Surgeon: Beryle Beams, MD;  Location: [facility];  Service: Endoscopy;  Laterality: N/A;  . ESOPHAGOGASTRODUODENOSCOPY N/A 05/22/2015   Procedure: ESOPHAGOGASTRODUODENOSCOPY (EGD);  Surgeon: Gatha Mayer, MD;  Location: [facility];  Service: Endoscopy;  Laterality: N/A;  . ESOPHAGOGASTRODUODENOSCOPY N/A 05/17/2016   Procedure: ESOPHAGOGASTRODUODENOSCOPY (EGD);  Surgeon: Juanita Craver, MD;  Location: [facility] ENDOSCOPY;  Service: Endoscopy;  Laterality: N/A;  . ESOPHAGOGASTRODUODENOSCOPY N/A 09/17/2017   Procedure: ESOPHAGOGASTRODUODENOSCOPY (EGD);  Surgeon: Milus Banister, MD;  Location[facility]COPY;  Service:  Endoscopy;  Laterality: N/A;  . ESOPHAGOGASTRODUODENOSCOPY (EGD) WITH PROPOFOL N/A 12/15/2018   Procedure: ESOPHAGOGASTRODUODENOSCOPY (EGD) WITH PROPOFOL;  Surgeon: Gatha Mayer, MD;  Location: [facility] ENDOS[facility];  Service: Endoscopy;  Laterality: N/A;  . FOREIGN BODY REMOVAL  09/17/2017   Procedure: FOREIGN BODY REMOVAL;  Surgeon: Milus Banister, MD;  Location[facility] ENDOSCOPY;  Service: Endoscopy;;  . FOREIGN BODY REMOVAL  12/15/2018   Procedure: FOREIGN BODY REMOVAL;  Surgeon: Gatha Mayer, MD;  Location: [facility] ENDOSCOPY;  Service: Endoscopy;;  . GIVENS CAPSULE STUDY N/A 05/18/2016   Procedure: GIVENS CAPSULE STUDY;  Surgeon: Manus Gunning, MD;  Location: [facility];  Service: Gastroenterology;  Laterality: N/A;  . LAPAROSCOPIC APPENDECTOMY  03/30/2011   Procedure: APPENDECTOMY LAPAROSCOPIC;  Surgeon: Joyice Faster. Cornett, MD;  Location: [facility];  Service: General;  Laterality: N/A;  . LEFT HEART CATHETERIZATION WITH CORONARY ANGIOGRAM N/A 01/24/2013   Procedure: LEFT HEART CATHETERIZATION WITH CORONARY ANGIOGRAM;  Surgeon: [name], MD;  Location: [facility] CATH LAB;  Service: Cardiovascular;  Laterality: N/A;  Right heart with grafts  . LITHOTRIPSY     "once" (01/21/2013)  . NM MYOCAR PERF WALL MOTION  05/2009   persantine  myoview - fixed moderate perfusion defect in inferior wall & lateral segment of apex (poor non-transmural infarction), minimal anterolateral periinfarct reversible ischemia seen, abnormal study, defects similar to 2006 study  . RIGHT/LEFT HEART CATH AND CORONARY ANGIOGRAPHY N/A 09/17/2017   Procedure: RIGHT/LEFT HEART CATH AND CORONARY ANGIOGRAPHY;  Surgeon: Martinique, Peter M, MD;  Location: [facility] CV LAB;  Service: Cardiovascular;  Laterality: N/A;  . SPLENECTOMY, TOTAL  01/2013  . TEE WITHOUT CARDIOVERSION N/A 01/23/2013   Procedure: TRANSESOPHAGEAL ECHOCARDIOGRAM (TEE);  Surgeon: Sanda Klein, MD;  Location: [facility] ENDOSCOPY;  Service: Cardiovascular;  Laterality: N/A;  .  TRANSTHORACIC ECHOCARDIOGRAM  06/23/2012   EF 40-45%, mild LVH; mild AV regurg; mild MV regurg; LV mod-severely dilated; RV mildly dilated; systolic pressure borderline increased; RA mod dilated    Social History   Tobacco Use  . Smoking status: Former Smoker    Packs/day: 2.00    Years: 20.00    Pack years: 40.00    Types: Cigarettes    Quit date: 04/08/1986    Years since quitting: 33.6  . Smokeless tobacco: Current User    Types: Chew  Substance Use Topics  . Alcohol use: No    Alcohol/week: 0.0 standard drinks  . Drug use: No    Family History  Problem Relation Age of Onset  . Heart disease Father        rheumatic fever  . Heart attack Brother 16  . Stroke Mother   . Stroke Brother 7    Allergies  Allergen Reactions  . Percocet [Oxycodone-Acetaminophen] Itching and Nausea Only    Medication list has been reviewed and updated.  Current Outpatient Medications on File Prior to Visit  Medication Sig Dispense Refill  . apixaban (ELIQUIS) 2.5 MG TABS tablet TAKE 1 TABLET(2.5 MG) BY MOUTH TWICE DAILY 180 tablet 1  . carvedilol (COREG) 3.125 MG tablet Take 1 tablet (3.125 mg total) by mouth 2 (two) times daily with a meal. TAKE 1 TABLET(3.125 MG) BY MOUTH TWICE DAILY WITH A MEAL 180 tablet 3  . ezetimibe (ZETIA) 10 MG tablet Take 1 tablet (10 mg total) by mouth daily. 90 tablet 3  . furosemide (LASIX) 40 MG tablet Take 20 mg by mouth.    . gabapentin (NEURONTIN) 300 MG capsule Take 300 mg by mouth at bedtime.    Marland Kitchen levothyroxine (SYNTHROID) 25 MCG tablet Take 1 tablet (25 mcg total) by mouth daily before breakfast. 90 tablet 1  . lisinopril (ZESTRIL) 5 MG tablet Take 1 tablet (5 mg total) by mouth daily. 90 tablet 1  . pantoprazole (PROTONIX) 40 MG tablet Take 1 tablet (40 mg total) by mouth 2 (two) times daily. 180 tablet 2  . rOPINIRole (REQUIP) 0.25 MG tablet TAKE 1 TABLET(0.25 MG) BY MOUTH TWICE DAILY 180 tablet 3  . rosuvastatin (CRESTOR) 40 MG tablet Take 1 tablet (40  mg total) by mouth daily. 90 tablet 1  . tiZANidine (ZANAFLEX) 4 MG tablet Take by mouth.    . traMADol (ULTRAM) 50 MG tablet TAKE ONE-HALF TABLET BY MOUTH EVERY 8 HOURS AS NEEDED FOR PAIN 30 tablet 1   No current facility-administered medications on file prior to visit.    Review of Systems:  As per HPI- otherwise negative.   Physical Examination: Vitals:   11/22/19 1304  BP: 128/80  Pulse: 68  Resp: 15  SpO2: 97%   Vitals:   11/22/19 1304  Weight: 188 lb (85.3 kg)  Height: 5' 6.5" (1.689 m)   Body mass index  is 29.89 kg/m. Ideal Body Weight: Weight in (lb) to have BMI = 25: 156.9  GEN: no acute distress.  Overweight but normal for age, looks well and his normal self  HEENT: Atraumatic, Normocephalic.  Ears and Nose: No external deformity. CV: RRR, No M/G/R. No JVD. No thrill. No extra heart sounds. PULM: CTA B, no wheezes, crackles, rhonchi. No retractions. No resp. distress. No accessory muscle use. EXTR: No c/c/e PSYCH: Normally interactive. Conversant.    Assessment and Plan: Acquired hypothyroidism - Plan: TSH  Persistent atrial fibrillation (HCC)  Anticoagulated on Eliquis - Plan: CBC, apixaban (ELIQUIS) 2.5 MG TABS tablet  Chronic kidney disease, unspecified CKD stage - Plan: Basic metabolic panel  Coronary artery disease involving coronary bypass graft of native heart without angina pectoris - Plan: Basic metabolic panel  Prediabetes - Plan: Hemoglobin A1c  Deficiency anemia - Plan: CBC  Cardiomyopathy, ischemic, improved - Plan: Lipid panel, lisinopril (ZESTRIL) 5 MG tablet, carvedilol (COREG) 3.125 MG tablet  Dyslipidemia - Plan: rosuvastatin (CRESTOR) 40 MG tablet  Following up today Routine labs pending as above Did refills He has not yet gotten his covid vaccines as he is getting epidural steroid injections- they plan to do this asap Seeing cardiology and nephrology as directed Will plan further follow- up pending labs.  This visit  occurred during the SARS-CoV-2 public health emergency.  Safety protocols were in place, including screening questions prior to the visit, additional usage of staff PPE, and extensive cleaning of exam room while observing appropriate contact time as indicated for disinfecting solutions.    Signed Lamar Blinks, MD  Addendum 9/9, received his labs as below-letter to patient  Results for orders placed or performed in visit on 11/22/19  CBC  Result Value Ref Range   WBC 6.6 3.8 - 10.8 Thousand/uL   RBC 3.81 (L) 4.20 - 5.80 Million/uL   Hemoglobin 12.5 (L) 13.2 - 17.1 g/dL   HCT 35.8 (L) 38 - 50 %   MCV 94.0 80.0 - 100.0 fL   MCH 32.8 27.0 - 33.0 pg   MCHC 34.9 32.0 - 36.0 g/dL   RDW 13.6 11.0 - 15.0 %   Platelets 211 140 - 400 Thousand/uL   MPV 10.4 7.5 - 12.5 fL  Basic metabolic panel  Result Value Ref Range   Glucose, Bld 103 (H) 65 - 99 mg/dL   BUN 21 7 - 25 mg/dL   Creat 1.52 (H) 0.70 - 1.11 mg/dL   BUN/Creatinine Ratio 14 6 - 22 (calc)   Sodium 139 135 - 146 mmol/L   Potassium 5.3 3.5 - 5.3 mmol/L   Chloride 103 98 - 110 mmol/L   CO2 30 20 - 32 mmol/L   Calcium 9.1 8.6 - 10.3 mg/dL  Hemoglobin A1c  Result Value Ref Range   Hgb A1c MFr Bld 6.1 (H) <5.7 % of total Hgb   Mean Plasma Glucose 128 (calc)   eAG (mmol/L) 7.1 (calc)  TSH  Result Value Ref Range   TSH 4.39 0.40 - 4.50 mIU/L  Lipid panel  Result Value Ref Range   Cholesterol 140 <200 mg/dL   HDL 55 > OR = 40 mg/dL   Triglycerides 92 <150 mg/dL   LDL Cholesterol (Calc) 67 mg/dL (calc)   Total CHOL/HDL Ratio 2.5 <5.0 (calc)   Non-HDL Cholesterol (Calc) 85 <130 mg/dL (calc)    Anemia, renal function stable He has undergone a recent upper and lower GI scope

## 2019-11-21 NOTE — Patient Instructions (Addendum)
It was great to see you again today, I will be in touch your labs soon as possible.  Please see me in about 6 months assuming all is well Take care, good luck with your leg  Please do get your covid 19 series as soon as you are able

## 2019-11-22 ENCOUNTER — Other Ambulatory Visit: Payer: Self-pay

## 2019-11-22 ENCOUNTER — Ambulatory Visit (INDEPENDENT_AMBULATORY_CARE_PROVIDER_SITE_OTHER): Payer: Medicare Other | Admitting: Family Medicine

## 2019-11-22 ENCOUNTER — Encounter: Payer: Self-pay | Admitting: Family Medicine

## 2019-11-22 VITALS — BP 128/80 | HR 68 | Resp 15 | Ht 66.5 in | Wt 188.0 lb

## 2019-11-22 DIAGNOSIS — N189 Chronic kidney disease, unspecified: Secondary | ICD-10-CM

## 2019-11-22 DIAGNOSIS — I4819 Other persistent atrial fibrillation: Secondary | ICD-10-CM | POA: Diagnosis not present

## 2019-11-22 DIAGNOSIS — E039 Hypothyroidism, unspecified: Secondary | ICD-10-CM | POA: Diagnosis not present

## 2019-11-22 DIAGNOSIS — Z7901 Long term (current) use of anticoagulants: Secondary | ICD-10-CM

## 2019-11-22 DIAGNOSIS — I255 Ischemic cardiomyopathy: Secondary | ICD-10-CM

## 2019-11-22 DIAGNOSIS — R7303 Prediabetes: Secondary | ICD-10-CM

## 2019-11-22 DIAGNOSIS — D539 Nutritional anemia, unspecified: Secondary | ICD-10-CM | POA: Diagnosis not present

## 2019-11-22 DIAGNOSIS — E785 Hyperlipidemia, unspecified: Secondary | ICD-10-CM

## 2019-11-22 DIAGNOSIS — I25810 Atherosclerosis of coronary artery bypass graft(s) without angina pectoris: Secondary | ICD-10-CM | POA: Diagnosis not present

## 2019-11-22 MED ORDER — APIXABAN 2.5 MG PO TABS: ORAL_TABLET | ORAL | 1 refills | Status: DC

## 2019-11-22 MED ORDER — LISINOPRIL 5 MG PO TABS: 5.0000 mg | ORAL_TABLET | Freq: Every day | ORAL | 3 refills | Status: DC

## 2019-11-22 MED ORDER — CARVEDILOL 3.125 MG PO TABS: 3.1250 mg | ORAL_TABLET | Freq: Two times a day (BID) | ORAL | 3 refills | Status: DC

## 2019-11-22 MED ORDER — ROSUVASTATIN CALCIUM 40 MG PO TABS: 40.0000 mg | ORAL_TABLET | Freq: Every day | ORAL | 3 refills | Status: DC

## 2019-11-23 LAB — CBC
HCT: 35.8 % — ABNORMAL LOW (ref 38.5–50.0)
Hemoglobin: 12.5 g/dL — ABNORMAL LOW (ref 13.2–17.1)
MCH: 32.8 pg (ref 27.0–33.0)
MCHC: 34.9 g/dL (ref 32.0–36.0)
MCV: 94 fL (ref 80.0–100.0)
MPV: 10.4 fL (ref 7.5–12.5)
Platelets: 211 10*3/uL (ref 140–400)
RBC: 3.81 10*6/uL — ABNORMAL LOW (ref 4.20–5.80)
RDW: 13.6 % (ref 11.0–15.0)
WBC: 6.6 10*3/uL (ref 3.8–10.8)

## 2019-11-23 LAB — TSH: TSH: 4.39 mIU/L (ref 0.40–4.50)

## 2019-11-23 LAB — LIPID PANEL
Cholesterol: 140 mg/dL (ref <200)
HDL: 55 mg/dL (ref >=40)
LDL Cholesterol (Calc): 67 mg/dL (calc)
Non-HDL Cholesterol (Calc): 85 mg/dL (calc) (ref <130)
Total CHOL/HDL Ratio: 2.5 (calc) (ref <5.0)
Triglycerides: 92 mg/dL (ref <150)

## 2019-11-23 LAB — BASIC METABOLIC PANEL
BUN/Creatinine Ratio: 14 (calc) (ref 6–22)
BUN: 21 mg/dL (ref 7–25)
CO2: 30 mmol/L (ref 20–32)
Calcium: 9.1 mg/dL (ref 8.6–10.3)
Chloride: 103 mmol/L (ref 98–110)
Creat: 1.52 mg/dL — ABNORMAL HIGH (ref 0.70–1.11)
Glucose, Bld: 103 mg/dL — ABNORMAL HIGH (ref 65–99)
Potassium: 5.3 mmol/L (ref 3.5–5.3)
Sodium: 139 mmol/L (ref 135–146)

## 2019-11-23 LAB — HEMOGLOBIN A1C
Hgb A1c MFr Bld: 6.1 % of total Hgb — ABNORMAL HIGH (ref <5.7)
Mean Plasma Glucose: 128 (calc)
eAG (mmol/L): 7.1 (calc)

## 2020-01-01 ENCOUNTER — Ambulatory Visit: Payer: Medicare Other | Admitting: Cardiovascular Disease

## 2020-01-08 NOTE — Progress Notes (Signed)
Cardiology Office Note:    Date:  01/09/2020   ID:  Gregory [name], DOB Aug 01, 1932, MRN [id number]  PCP:  Darreld Mclean, MD  Cardiologist:  Shelva Majestic, MD   Referring MD: Darreld Mclean, MD   Chief Complaint  Patient presents with  . Follow-up    CAD, ICM, AFib    History of Present Illness:    Gregory [name] is a 86 y.o. male with a hx of CAD s/p CABG (LIMA-LAD, SVG-OM, SVG-PDA-PLA) in 2003, heart cath 2008 with EF 20-25%. Pt declined ICD, EF improved on echo 2012 to 35-40% on medical therapy. He has permanent Afib and refused coumadin therapy in the past. He had a splenic infarct of embolic etiology [id number]/8182 with TEE 01/23/13 with EF 15-25% and large solid fixed thrombus in the atrial appendage. Pt refused lifevest. He was maintained on xarelto. Echo 06/14/13 with EF 50-55% and no RWMA. He has severe bilateral carotid artery disease followed by [facility]. He suffered a renal infarct with AKI when he stopped xarelto for 3 days for a back injection. He has 80-99% stenosis in the left ICA and 60-79% right ICA (2018) but he previously has not wanted to consider carotid artery surgery. He has been asymptomatic. CKD followed by Dr. Posey Pronto.   He was transitioned to eliquis. Echo 2019 during a hospitalization for CHF exacerbation showed EF 25-30%. Repeat heart cath with patent 3-3 grafts patent. Repeat echo 02/2018 with improved EF to 40-45%.  He was last seen by [facility] 05/31/19 and was doing well at that time.   He presents today for routine follow up. He is maintained on low dose eliquis for age and renal function, low dose coreg, 20 mg lasix, 5 mg lisinopril, zetia and crestor. He is doing well, activity limited by back pain. He denies angina and dyspnea. He appears euvolemic. No dizziness or syncope. He continues to decline intervention in his ICA.   Past Medical History:  Diagnosis Date  . Acute respiratory failure ([place])   . Appendicitis with abscess 03/31/2011  . Arthritis   . CAD  (coronary artery disease),CABG 1993-LIMA to the LAD, SVG to Om and SVG to PDA/PLA, patent on cath 2008. 2003   a. s/p CABG 2003. b. last cath 2008 with patent grafts.  . Chronic atrial fibrillation (H[facility])    permanent  . Chronic systolic CHF (congestive heart failure) ([place])    a. Prior low EF, later normalized.  . Essential hypertension   . GERD (gastroesophageal reflux disease)   . Heart murmur   . Hyperlipidemia   . Ischemic cardiomyopathy   . Kidney stones    "passed all but one time when he had to have lithotripsy" (01/21/2013)  . Pulmonary hypertension ([place])   . 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 07/2015 - after holding Xarelto x 3 days for spinal procedure.  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 SSS (sick sinus syndrome) ([place])   . Stroke [facility]) 2007   "slightly drags left foot since; recovered qthing else" (01/21/2013)  . Syncope 07/2015   a. felt vasovagal in setting of pain from renal infarct.    Past Surgical History:  Procedure Laterality Date  . BALLOON DILATION N/A 05/22/2015   Procedure: BALLOON DILATION;  Surgeon: Gatha Mayer, MD;  Location: [facility] ENDOSCOPY;  Service: Endoscopy;  Laterality: N/A;  . BALLOON DILATION N/A 12/15/2018   Procedure: BALLOON DILATION;  Surgeon: Gatha Mayer, MD;  Location: [facility] ENDOSCOPY;  Service: Endoscopy;  Laterality: N/A;  .  CARDIAC CATHETERIZATION  02/24/2002   reduced LV function, 60-70% prox RCA stenosis, 70% PLA ostial stenosis, 80% secondary branch of PLA stenosis - subsequent CABG (Dr. Jackie Plum)  . Carotid Doppler  06/2012   50-69% right bulb/prox ICA diameter reduction; 70-99% left bulb/prox ICA diameter reduction  . CATARACT EXTRACTION W/ INTRAOCULAR LENS IMPLANT Bilateral 2013  . COLONOSCOPY N/A 05/18/2016   Procedure: COLONOSCOPY;  Surgeon: Manus Gunning, MD;  Location[facility] ENDOSCOPY;  Service: Gastroenterology;  Laterality: N/A;  . CORONARY ANGIOPLASTY  07/05/2006   3 vessel CAD, patent LIMA to LAD, patentVG to OM, patent SVG to PDA & PLA,  mild MR, severe LV systolic dysfunction (Dr. Gerrie Nordmann)  . CORONARY ARTERY BYPASS GRAFT  03/01/2002   LIMA to LAD, reverse SVG to OM, reverse SVG to PDA of RCA, reverse SVG to PLA of RCA, ligation of LA appendage (Dr. Servando Snare)  . ENTEROSCOPY N/A 05/20/2016   Procedure: ENTEROSCOPY;  Surgeon: Manus Gunning, MD;  Location: [place];  Service: Gastroenterology;  Laterality: N/A;  . ESOPHAGOGASTRODUODENOSCOPY  02/01/2012   Procedure: ESOPHAGOGASTRODUODENOSCOPY (EGD);  Surgeon: Beryle Beams, MD;  Location: [facility] END[facility];  Service: Endoscopy;  Laterality: N/A;  . ESOPHAGOGASTRODUODENOSCOPY N/A 05/22/2015   Procedure: ESOPHAGOGASTRODUODENOSCOPY (EGD);  Surgeon: Gatha Mayer, MD;  Location: [facility] ENDOSCOPY;  Service: Endoscopy;  Laterality: N/A;  . ESOPHAGOGASTRODUODENOSCOPY N/A 05/17/2016   Procedure: ESOPHAGOGASTRODUODENOSCOPY (EGD);  Surgeon: Juanita Craver, MD;  Location: [facility] ENDOSCOPY;  Service: Endoscopy;  Laterality: N/A;  . ESOPHAGOGASTRODUODENOSCOPY N/A 09/17/2017   Procedure: ESOPHAGOGASTRODUODENOSCOPY (EGD);  Surgeon: Milus Banister, MD;  Location: [facility] ENDOSCOPY;  Service: Endoscopy;  Laterality: N/A;  . ESOPHAGOGASTRODUODENOSCOPY (EGD) WITH PROPOFOL N/A 12/15/2018   Procedure: ESOPHAGOGASTRODUODENOSCOPY (EGD) WITH PROPOFOL;  Surgeon: Gatha Mayer, MD;  Location: [facility] ENDOSCOPY;  Service: Endoscopy;  Laterality: N/A;  . FOREIGN BODY REMOVAL  09/17/2017   Procedure: FOREIGN BODY REMOVAL;  Surgeon: Milus Banister, MD;  Location: [facility] ENDOSCOPY;  Service: Endoscopy;;  . FOREIGN BODY REMOVAL  12/15/2018   Procedure: FOREIGN BODY REMOVAL;  Surgeon: Gatha Mayer, MD;  Location: [facility] ENDOSCOPY;  Service: Endoscopy;;  . GIVENS CAPSULE STUDY N/A 05/18/2016   Procedure: GIVENS CAPSULE STUDY;  Surgeon: Manus Gunning, MD;  Location: [place];  Service: Gastroenterology;  Laterality: N/A;  . LAPAROSCOPIC APPENDECTOMY  03/30/2011   Procedure: APPENDECTOMY LAPAROSCOPIC;  Surgeon: Joyice Faster.  Cornett, MD;  Location: [place];  Service: General;  Laterality: N/A;  . LEFT HEART CATHETERIZATION WITH CORONARY ANGIOGRAM N/A 01/24/2013   Procedure: LEFT HEART CATHETERIZATION WITH CORONARY ANGIOGRAM;  Surgeon: [name], MD;  Location: [facility] CATH LAB;  Service: Cardiovascular;  Laterality: N/A;  Right heart with grafts  . LITHOTRIPSY     "once" (01/21/2013)  . NM MYOCAR PERF WALL MOTION  05/2009   persantine myoview - fixed moderate perfusion defect in inferior wall & lateral segment of apex (poor non-transmural infarction), minimal anterolateral periinfarct reversible ischemia seen, abnormal study, defects similar to 2006 study  . RIGHT/LEFT HEART CATH AND CORONARY ANGIOGRAPHY N/A 09/17/2017   Procedure: RIGHT/LEFT HEART CATH AND CORONARY ANGIOGRAPHY;  Surgeon: Martinique, Peter M, MD;  Location: [place] CV LAB;  Service: Cardiovascular;  Laterality: N/A;  . SPLENECTOMY, TOTAL  01/2013  . TEE WITHOUT CARDIOVERSION N/A 01/23/2013   Procedure: TRANSESOPHAGEAL ECHOCARDIOGRAM (TEE);  Surgeon: Sanda Klein, MD;  Location: [facility] ENDOSCOPY;  Service: Cardiovascular;  Laterality: N/A;  . TRANSTHORACIC ECHOCARDIOGRAM  06/23/2012   EF 40-45%, mild LVH; mild AV regurg; mild  MV regurg; LV mod-severely dilated; RV mildly dilated; systolic pressure borderline increased; RA mod dilated    Current Medications: Current Meds  Medication Sig  . apixaban (ELIQUIS) 2.5 MG TABS tablet TAKE 1 TABLET(2.5 MG) BY MOUTH TWICE DAILY  . carvedilol (COREG) 3.125 MG tablet Take 1 tablet (3.125 mg total) by mouth 2 (two) times daily with a meal. TAKE 1 TABLET(3.125 MG) BY MOUTH TWICE DAILY WITH A MEAL  . ezetimibe (ZETIA) 10 MG tablet Take 1 tablet (10 mg total) by mouth daily.  . furosemide (LASIX) 40 MG tablet Take 20 mg by mouth.  . gabapentin (NEURONTIN) 300 MG capsule Take 300 mg by mouth at bedtime.  Marland Kitchen levothyroxine (SYNTHROID) 25 MCG tablet Take 1 tablet (25 mcg total) by mouth daily before breakfast.  . lisinopril  (ZESTRIL) 5 MG tablet Take 1 tablet (5 mg total) by mouth daily.  . pantoprazole (PROTONIX) 40 MG tablet Take 1 tablet (40 mg total) by mouth 2 (two) times daily.  Marland Kitchen rOPINIRole (REQUIP) 0.25 MG tablet TAKE 1 TABLET(0.25 MG) BY MOUTH TWICE DAILY  . rosuvastatin (CRESTOR) 40 MG tablet Take 1 tablet (40 mg total) by mouth daily.  Marland Kitchen tiZANidine (ZANAFLEX) 4 MG tablet Take by mouth.  . traMADol (ULTRAM) 50 MG tablet TAKE ONE-HALF TABLET BY MOUTH EVERY 8 HOURS AS NEEDED FOR PAIN     Allergies:   Percocet [oxycodone-acetaminophen]   Social History   Socioeconomic History  . Marital status: Married    Spouse name: Not on file  . Number of children: 9  . Years of education: 8  . Highest education level: Not on file  Occupational History  . Occupation: retired  Tobacco Use  . Smoking status: Former Smoker    Packs/day: 2.00    Years: 20.00    Pack years: 40.00    Types: Cigarettes    Quit date: 04/08/1986    Years since quitting: 33.7  . Smokeless tobacco: Current User    Types: Chew  Substance and Sexual Activity  . Alcohol use: No    Alcohol/week: 0.0 standard drinks  . Drug use: No  . Sexual activity: Not on file  Other Topics Concern  . Not on file  Social History Narrative    married, 5 sons 4 daughters. He is retired Horticulturist, commercial. 2-3 caffeinated beverages a day no alcohol   Social Determinants of Health   Financial Resource Strain:   . Difficulty of Paying Living Expenses: Not on file  Food Insecurity:   . Worried About Charity fundraiser in the Last Year: Not on file  . Ran Out of Food in the Last Year: Not on file  Transportation Needs:   . Lack of Transportation (Medical): Not on file  . Lack of Transportation (Non-Medical): Not on file  Physical Activity:   . Days of Exercise per Week: Not on file  . Minutes of Exercise per Session: Not on file  Stress:   . Feeling of Stress : Not on file  Social Connections:   . Frequency of Communication with Friends and  Family: Not on file  . Frequency of Social Gatherings with Friends and Family: Not on file  . Attends Religious Services: Not on file  . Active Member of Clubs or Organizations: Not on file  . Attends Archivist Meetings: Not on file  . Marital Status: Not on file     Family History: The patient's family history includes Heart attack (age of onset: 81) in his  brother; Heart disease in his father; Stroke in his mother; Stroke (age of onset: 48) in his brother.  ROS:   Please see the history of present illness.     All other systems reviewed and are negative.  EKGs/Labs/Other Studies Reviewed:    The following studies were reviewed today:  Echo  2019 Study Conclusions   - Left ventricle: The cavity size was normal. Wall thickness was  increased in a pattern of mild LVH. Basal to mid anterolateral  and inferolateral hypokinesis. Systolic function was mildly to  moderately reduced. The estimated ejection fraction was in the  range of 40% to 45%. The study was not technically sufficient to  allow evaluation of LV diastolic dysfunction due to atrial  fibrillation.  - Aortic valve: Trileaflet; moderately calcified leaflets. There  was no stenosis. There was mild regurgitation.  - Mitral valve: There was mild regurgitation.  - Left atrium: The atrium was severely dilated.  - Right ventricle: The cavity size was mildly to moderately  dilated. Systolic function was mildly to moderately reduced.  - Right atrium: The atrium was moderately dilated.  - Tricuspid valve: Peak RV-RA gradient (S): 32 mm Hg.  - Pulmonary arteries: PA peak pressure: 35 mm Hg (S).  - Inferior vena cava: The vessel was normal in size. The  respirophasic diameter changes were in the normal range (>= 50%),  consistent with normal central venous pressure.   Impressions:   - The patient was in atrial fibrillation. Normal LV size with mild  LV hypertrophy. EF 40-45%, basal to mid  inferolateral and  anterolateral hypokinesis. Mild to moderate RV dilation with mild  to moderately decreased RV systolic function. Mild aortic  insufficiency and mild mitral regurgitation. Biatrial  enlargement.   EKG:  EKG is  ordered today.  The ekg ordered today demonstrates Afib with PVC, ventricular rate 67, anterolateral infarct  Recent Labs: 05/17/2019: ALT 11 11/22/2019: BUN 21; Creat 1.52; Hemoglobin 12.5; Platelets 211; Potassium 5.3; Sodium 139; TSH 4.39  Recent Lipid Panel    Component Value Date/Time   CHOL 140 11/22/2019 1320   CHOL 136 05/18/2018 1355   TRIG 92 11/22/2019 1320   HDL 55 11/22/2019 1320   HDL 56 05/18/2018 1355   CHOLHDL 2.5 11/22/2019 1320   VLDL 20.4 05/17/2019 1319   LDLCALC 67 11/22/2019 1320    Physical Exam:    VS:  BP 134/82   Pulse 67   Ht 5' 6.5" (1.689 m)   Wt 193 lb 12.8 oz (87.9 kg)   SpO2 95%   BMI 30.81 kg/m     Wt Readings from Last 3 Encounters:  01/09/20 193 lb 12.8 oz (87.9 kg)  11/22/19 188 lb (85.3 kg)  05/31/19 193 lb (87.5 kg)     GEN: elderly male in no acute distress HEENT: Normal NECK: bruits difficult to hear LYMPHATICS: No lymphadenopathy CARDIAC: irregular rhythm, regular rhythm RESPIRATORY:  Clear to auscultation without rales, wheezing or rhonchi  ABDOMEN: Soft, non-tender, non-distended MUSCULOSKELETAL:  No edema; No deformity  SKIN: Warm and dry NEUROLOGIC:  Alert and oriented x 3 PSYCHIATRIC:  Normal affect   ASSESSMENT:    1. Coronary artery disease involving coronary bypass graft of native heart without angina pectoris   2. Hx of CABG   3. Ischemic cardiomyopathy   4. Bilateral carotid artery stenosis   5. Hyperlipidemia with target LDL less than 70   6. Permanent atrial fibrillation ([place])   7. Anticoagulation adequate    PLAN:  In order of problems listed above:  CAD s/p CABG - last heart cath with patent grafts - no ASA in the setting of eliquis - no angina   Ischemic  cardiomyopathy - EF has fluctuated from 20-45% over the years - renal function has precluded use of entresto - he is maintained on BB and ACEI   Carotid artery stenosis  - followed by [facility] - no dizziness or syncope - he does not wish to proceed with intervention    Hyperlipidemia with LDL goal < 70 05/17/2019: VLDL 20.4 11/22/2019: Cholesterol 140; HDL 55; LDL Cholesterol (Calc) 67; Triglycerides 92 - 40 mg crestor and 10 mg zetia   Permanent Afib - 2.5 mg eliquis for age and renal function - no palpitations, no bleeding  In the donut hole - will attempt to provide samples of eliquis.   Medication Adjustments/Labs and Tests Ordered: Current medicines are reviewed at length with the patient today.  Concerns regarding medicines are outlined above.  Orders Placed This Encounter  Procedures  . EKG 12-Lead   No orders of the defined types were placed in this encounter.   Signed, Ledora Bottcher, PA  01/09/2020 4:24 PM    [facility]

## 2020-01-09 ENCOUNTER — Other Ambulatory Visit: Payer: Self-pay

## 2020-01-09 ENCOUNTER — Ambulatory Visit (INDEPENDENT_AMBULATORY_CARE_PROVIDER_SITE_OTHER): Payer: Medicare Other | Admitting: Physician Assistant

## 2020-01-09 ENCOUNTER — Encounter: Payer: Self-pay | Admitting: Physician Assistant

## 2020-01-09 VITALS — BP 134/82 | HR 67 | Ht 66.5 in | Wt 193.8 lb

## 2020-01-09 DIAGNOSIS — Z951 Presence of aortocoronary bypass graft: Secondary | ICD-10-CM

## 2020-01-09 DIAGNOSIS — I255 Ischemic cardiomyopathy: Secondary | ICD-10-CM

## 2020-01-09 DIAGNOSIS — I4821 Permanent atrial fibrillation: Secondary | ICD-10-CM

## 2020-01-09 DIAGNOSIS — I6523 Occlusion and stenosis of bilateral carotid arteries: Secondary | ICD-10-CM

## 2020-01-09 DIAGNOSIS — E785 Hyperlipidemia, unspecified: Secondary | ICD-10-CM | POA: Diagnosis not present

## 2020-01-09 DIAGNOSIS — Z7901 Long term (current) use of anticoagulants: Secondary | ICD-10-CM

## 2020-01-09 DIAGNOSIS — I25810 Atherosclerosis of coronary artery bypass graft(s) without angina pectoris: Secondary | ICD-10-CM | POA: Diagnosis not present

## 2020-01-09 NOTE — Patient Instructions (Addendum)
Medication Instructions:  No Changes *If you need a refill on your cardiac medications before your next appointment, please call your pharmacy*   Lab Work: No labs If you have labs (blood work) drawn today and your tests are completely normal, you will receive your results only by: Marland Kitchen MyChart Message (if you have MyChart) OR . A paper copy in the mail If you have any lab test that is abnormal or we need to change your treatment, we will call you to review the results.   Testing/Procedures: No Testing   Follow-Up: At [facility], you and your health needs are our priority.  As part of our continuing mission to provide you with exceptional heart care, we have created designated Provider Care Teams.  These Care Teams include your primary Cardiologist (physician) and Advanced Practice Providers (APPs -  Physician Assistants and Nurse Practitioners) who all work together to provide you with the care you need, when you need it.     Your next appointment:   6 month(s)  The format for your next appointment:   In Person  Provider:   Shelva Majestic, MD

## 2020-02-09 ENCOUNTER — Other Ambulatory Visit: Payer: Self-pay

## 2020-02-09 ENCOUNTER — Emergency Department (HOSPITAL_COMMUNITY): Payer: Medicare Other | Admitting: Certified Registered"

## 2020-02-09 ENCOUNTER — Encounter (HOSPITAL_COMMUNITY): Payer: Self-pay | Admitting: Emergency Medicine

## 2020-02-09 ENCOUNTER — Ambulatory Visit (HOSPITAL_COMMUNITY)
Admission: EM | Admit: 2020-02-09 | Discharge: 2020-02-09 | Disposition: A | Discharge status: Home / Self Care | Payer: Medicare Other | Attending: Gastroenterology | Admitting: Gastroenterology

## 2020-02-09 ENCOUNTER — Encounter (HOSPITAL_COMMUNITY): Admission: EM | Disposition: A | Discharge status: Home / Self Care | Payer: Self-pay | Source: Home / Self Care

## 2020-02-09 DIAGNOSIS — R1319 Other dysphagia: Secondary | ICD-10-CM

## 2020-02-09 DIAGNOSIS — I110 Hypertensive heart disease with heart failure: Secondary | ICD-10-CM | POA: Diagnosis not present

## 2020-02-09 DIAGNOSIS — E785 Hyperlipidemia, unspecified: Secondary | ICD-10-CM | POA: Insufficient documentation

## 2020-02-09 DIAGNOSIS — K222 Esophageal obstruction: Secondary | ICD-10-CM | POA: Diagnosis not present

## 2020-02-09 DIAGNOSIS — Z87891 Personal history of nicotine dependence: Secondary | ICD-10-CM | POA: Diagnosis not present

## 2020-02-09 DIAGNOSIS — I2510 Atherosclerotic heart disease of native coronary artery without angina pectoris: Secondary | ICD-10-CM | POA: Insufficient documentation

## 2020-02-09 DIAGNOSIS — I495 Sick sinus syndrome: Secondary | ICD-10-CM | POA: Insufficient documentation

## 2020-02-09 DIAGNOSIS — I129 Hypertensive chronic kidney disease with stage 1 through stage 4 chronic kidney disease, or unspecified chronic kidney disease: Secondary | ICD-10-CM | POA: Diagnosis not present

## 2020-02-09 DIAGNOSIS — Z885 Allergy status to narcotic agent status: Secondary | ICD-10-CM | POA: Insufficient documentation

## 2020-02-09 DIAGNOSIS — K219 Gastro-esophageal reflux disease without esophagitis: Secondary | ICD-10-CM | POA: Insufficient documentation

## 2020-02-09 DIAGNOSIS — I272 Pulmonary hypertension, unspecified: Secondary | ICD-10-CM | POA: Insufficient documentation

## 2020-02-09 DIAGNOSIS — I5022 Chronic systolic (congestive) heart failure: Secondary | ICD-10-CM | POA: Insufficient documentation

## 2020-02-09 DIAGNOSIS — Z7901 Long term (current) use of anticoagulants: Secondary | ICD-10-CM | POA: Diagnosis not present

## 2020-02-09 DIAGNOSIS — T18128A Food in esophagus causing other injury, initial encounter: Secondary | ICD-10-CM | POA: Insufficient documentation

## 2020-02-09 DIAGNOSIS — X58XXXA Exposure to other specified factors, initial encounter: Secondary | ICD-10-CM | POA: Diagnosis not present

## 2020-02-09 DIAGNOSIS — Z8673 Personal history of transient ischemic attack (TIA), and cerebral infarction without residual deficits: Secondary | ICD-10-CM | POA: Diagnosis not present

## 2020-02-09 DIAGNOSIS — Z8249 Family history of ischemic heart disease and other diseases of the circulatory system: Secondary | ICD-10-CM | POA: Diagnosis not present

## 2020-02-09 DIAGNOSIS — I255 Ischemic cardiomyopathy: Secondary | ICD-10-CM | POA: Insufficient documentation

## 2020-02-09 DIAGNOSIS — Z951 Presence of aortocoronary bypass graft: Secondary | ICD-10-CM | POA: Diagnosis not present

## 2020-02-09 DIAGNOSIS — Z20822 Contact with and (suspected) exposure to covid-19: Secondary | ICD-10-CM | POA: Diagnosis not present

## 2020-02-09 DIAGNOSIS — Z823 Family history of stroke: Secondary | ICD-10-CM | POA: Insufficient documentation

## 2020-02-09 DIAGNOSIS — Z87448 Personal history of other diseases of urinary system: Secondary | ICD-10-CM | POA: Insufficient documentation

## 2020-02-09 DIAGNOSIS — N183 Chronic kidney disease, stage 3 unspecified: Secondary | ICD-10-CM | POA: Diagnosis not present

## 2020-02-09 DIAGNOSIS — I482 Chronic atrial fibrillation, unspecified: Secondary | ICD-10-CM | POA: Diagnosis not present

## 2020-02-09 DIAGNOSIS — Z79899 Other long term (current) drug therapy: Secondary | ICD-10-CM | POA: Diagnosis not present

## 2020-02-09 HISTORY — PX: FOREIGN BODY REMOVAL: SHX962

## 2020-02-09 HISTORY — PX: ESOPHAGOGASTRODUODENOSCOPY: SHX5428

## 2020-02-09 LAB — POC SARS CORONAVIRUS 2 AG -  ED: SARS Coronavirus 2 Ag: NEGATIVE

## 2020-02-09 SURGERY — EGD (ESOPHAGOGASTRODUODENOSCOPY)
Anesthesia: Monitor Anesthesia Care

## 2020-02-09 SURGERY — EGD (ESOPHAGOGASTRODUODENOSCOPY)
Anesthesia: Monitor Anesthesia Care | Laterality: Left

## 2020-02-09 MED ORDER — EPHEDRINE SULFATE-NACL 50-0.9 MG/10ML-% IV SOSY
PREFILLED_SYRINGE | INTRAVENOUS | Status: DC | PRN
  Administered 2020-02-09: 5 mg via INTRAVENOUS

## 2020-02-09 MED ORDER — ONDANSETRON HCL 4 MG/2ML IJ SOLN
INTRAMUSCULAR | Status: DC | PRN
  Administered 2020-02-09: 4 mg via INTRAVENOUS

## 2020-02-09 MED ORDER — FENTANYL CITRATE (PF) 100 MCG/2ML IJ SOLN
INTRAMUSCULAR | Status: AC
  Filled 2020-02-09: qty 2

## 2020-02-09 MED ORDER — LACTATED RINGERS IV SOLN: INTRAVENOUS | Status: DC | PRN

## 2020-02-09 MED ORDER — SUCCINYLCHOLINE CHLORIDE 200 MG/10ML IV SOSY
PREFILLED_SYRINGE | INTRAVENOUS | Status: DC | PRN
  Administered 2020-02-09: 160 mg via INTRAVENOUS

## 2020-02-09 MED ORDER — PHENYLEPHRINE 40 MCG/ML (10ML) SYRINGE FOR IV PUSH (FOR BLOOD PRESSURE SUPPORT)
PREFILLED_SYRINGE | INTRAVENOUS | Status: DC | PRN
  Administered 2020-02-09: 80 ug via INTRAVENOUS

## 2020-02-09 MED ORDER — PROPOFOL 10 MG/ML IV BOLUS
INTRAVENOUS | Status: DC | PRN
  Administered 2020-02-09: 50 mg via INTRAVENOUS
  Administered 2020-02-09: 150 mg via INTRAVENOUS

## 2020-02-09 MED ORDER — LIDOCAINE 2% (20 MG/ML) 5 ML SYRINGE
INTRAMUSCULAR | Status: DC | PRN
  Administered 2020-02-09: 100 mg via INTRAVENOUS

## 2020-02-09 MED ORDER — EPHEDRINE SULFATE-NACL 50-0.9 MG/10ML-% IV SOSY
PREFILLED_SYRINGE | INTRAVENOUS | Status: DC | PRN
  Administered 2020-02-09 (×2): 5 mg via INTRAVENOUS

## 2020-02-09 MED ORDER — SODIUM CHLORIDE 0.9 % IV SOLN: INTRAVENOUS | Status: DC

## 2020-02-09 MED ORDER — FENTANYL CITRATE (PF) 250 MCG/5ML IJ SOLN
INTRAMUSCULAR | Status: DC | PRN
  Administered 2020-02-09: 100 ug via INTRAVENOUS

## 2020-02-09 MED ORDER — PHENYLEPHRINE HCL-NACL 10-0.9 MG/250ML-% IV SOLN
INTRAVENOUS | Status: DC | PRN
  Administered 2020-02-09: 25 ug/min via INTRAVENOUS

## 2020-02-09 MED ORDER — SUCRALFATE 1 GM/10ML PO SUSP: 1.0000 g | Freq: Two times a day (BID) | ORAL | 1 refills | Status: DC

## 2020-02-09 NOTE — ED Triage Notes (Signed)
Pt coming from home. Pt states he was eating [place] yesterday and feels like there is a piece stuck in his throat. Pt endorses inability to swallow food or liquid since. VSS. NAD. Pt with hx of same.

## 2020-02-09 NOTE — Discharge Instructions (Signed)
YOU HAD AN ENDOSCOPIC PROCEDURE TODAY: Refer to the procedure report and other information in the discharge instructions given to you for any specific questions about what was found during the examination. If this information does not answer your questions, please call [place] office at [phone] to clarify.  ° °YOU SHOULD EXPECT: Some feelings of bloating in the abdomen. Passage of more gas than usual. Walking can help get rid of the air that was put into your GI tract during the procedure and reduce the bloating. If you had a lower endoscopy (such as a colonoscopy or flexible sigmoidoscopy) you may notice spotting of blood in your stool or on the toilet paper. Some abdominal soreness may be present for a day or two, also. ° °DIET: Your first meal following the procedure should be a light meal and then it is ok to progress to your normal diet. A half-sandwich or bowl of soup is an example of a good first meal. Heavy or fried foods are harder to digest and may make you feel nauseous or bloated. Drink plenty of fluids but you should avoid alcoholic beverages for 24 hours. If you had a esophageal dilation, please see attached instructions for diet.   ° °ACTIVITY: Your care partner should take you home directly after the procedure. You should plan to take it easy, moving slowly for the rest of the day. You can resume normal activity the day after the procedure however YOU SHOULD NOT DRIVE, use power tools, machinery or perform tasks that involve climbing or major physical exertion for 24 hours (because of the sedation medicines used during the test).  ° °SYMPTOMS TO REPORT IMMEDIATELY: °A gastroenterologist can be reached at any hour. Please call [phone]  for any of the following symptoms:  °Following lower endoscopy (colonoscopy, flexible sigmoidoscopy) °Excessive amounts of blood in the stool  °Significant tenderness, worsening of abdominal pains  °Swelling of the abdomen that is new, acute  °Fever of 100° or  higher  °Following upper endoscopy (EGD, EUS, ERCP, esophageal dilation) °Vomiting of blood or coffee ground material  °New, significant abdominal pain  °New, significant chest pain or pain under the shoulder blades  °Painful or persistently difficult swallowing  °New shortness of breath  °Black, tarry-looking or red, bloody stools ° °FOLLOW UP:  °If any biopsies were taken you will be contacted by phone or by letter within the next 1-3 weeks. Call [phone]  if you have not heard about the biopsies in 3 weeks.  °Please also call with any specific questions about appointments or follow up tests. ° °

## 2020-02-09 NOTE — Transfer of Care (Signed)
Immediate Anesthesia Transfer of Care Note  Patient: Gregory [name]  Procedure(s) Performed: ESOPHAGOGASTRODUODENOSCOPY (EGD) (Left ) FOREIGN BODY REMOVAL  Patient Location: Endoscopy Unit  Anesthesia Type:General  Level of Consciousness: awake, alert  and oriented  Airway & Oxygen Therapy: Patient Spontanous Breathing and Patient connected to nasal cannula oxygen  Post-op Assessment: Report given to RN and Post -op Vital signs reviewed and stable  Post vital signs: Reviewed and stable  Last Vitals:  Vitals Value Taken Time  BP    Temp    Pulse    Resp    SpO2      Last Pain:  Vitals:   02/09/20 1435  TempSrc: Oral  PainSc: 5          Complications: No complications documented.

## 2020-02-09 NOTE — Op Note (Signed)
[facility] Patient Name: Gregory [name] Procedure Date : 02/09/2020 MRN: [id number] Attending MD: Gerrit Heck , MD Date of Birth: 05/15/1932 CSN: [id number] Age: 86 Admit Type: Outpatient Procedure:                Upper GI endoscopy Indications:              Dysphagia, Foreign body in the esophagus (acute                            food impaction) Providers:                Gerrit Heck, MD, Doristine Johns, RN, Cletis Athens, Technician, Lesia Sago, Technician,                            Silas Flood, CRNA Referring MD:              Medicines:                General Anesthesia Complications:            No immediate complications. Estimated Blood Loss:     Estimated blood loss was minimal. Procedure:                Pre-Anesthesia Assessment:                           - Prior to the procedure, a History and Physical                            was performed, and patient medications and                            allergies were reviewed. The patient's tolerance of                            previous anesthesia was also reviewed. The risks                            and benefits of the procedure and the sedation                            options and risks were discussed with the patient.                            All questions were answered, and informed consent                            was obtained. Prior Anticoagulants: The patient has                            taken Eliquis (apixaban), last dose was 1 day prior  to procedure. ASA Grade Assessment: III - A patient                            with severe systemic disease. After reviewing the                            risks and benefits, the patient was deemed in                            satisfactory condition to undergo the procedure.                           After obtaining informed consent, the endoscope was                            passed under  direct vision. Throughout the                            procedure, the patient's blood pressure, pulse, and                            oxygen saturations were monitored continuously. The                            GIF-H190 ([id number]) Olympus gastroscope was                            introduced through the mouth, and advanced to the                            second part of duodenum. The upper GI endoscopy was                            accomplished without difficulty. The patient                            tolerated the procedure well. Scope In: Scope Out: Findings:      Food was found in the middle third of the esophagus and in the lower       third of the esophagus. Removal of food was accomplished using Roth net.       Estimated blood loss: none.      One benign-appearing, intrinsic moderate stenosis was found in the lower       third of the esophagus. The stenosis was traversed. There was associated       mucosal erythema secondary to food impaction. There was a small mucosal       rent in the upper esophagus due to food removal.      The entire examined stomach was normal.      The examined duodenum was normal. Impression:               - Food in the middle third of the esophagus and in  the lower third of the esophagus. Removal was                            successful.                           - Benign-appearing esophageal stenosis.                           - Normal stomach.                           - Normal examined duodenum. Recommendation:           - Discharge patient to home (with daughter) when he                            meets post anesthesia discharge criteria.                           - Full liquid diet today. Can slowly advance to                            soft foods tomorrow. Do NOT advance beyond small                            foods until you are seen in follow-up in the GI                            Clinic.                            - Continue present medications.                           - Return to GI clinic in 3-4 weeks.                           - Repeat upper endoscopy in 4-6 weeks for                            evaluation of mucosal healing and plan for repeta                            esophageal dilation.                           - Can discharge with carafate 1 gm PO BID x4 weeks                            to aid in mucosal healing. Procedure Code(s):        --- Professional ---                           [phone], Esophagogastroduodenoscopy, flexible,  transoral; with removal of foreign body(s) Diagnosis Code(s):        --- Professional ---                           J57.017B, Food in esophagus causing other injury,                            initial encounter                           K22.2, Esophageal obstruction                           R13.10, Dysphagia, unspecified                           T18.108A, Unspecified foreign body in esophagus                            causing other injury, initial encounter CPT copyright 2019 [employer]. All rights reserved. The codes documented in this report are preliminary and upon coder review may  be revised to meet current compliance requirements. Gerrit Heck, MD 02/09/2020 3:40:38 PM Number of Addenda: 0

## 2020-02-09 NOTE — Interval H&P Note (Signed)
History and Physical Interval Note:  02/09/2020 2:45 PM  Gregory [name]  has presented today for surgery, with the diagnosis of food impaction.  The various methods of treatment have been discussed with the patient and family. After consideration of risks, benefits and other options for treatment, the patient has consented to  Procedure(s): ESOPHAGOGASTRODUODENOSCOPY (EGD) (Left) as a surgical intervention.  The patient's history has been reviewed, patient examined, no change in status, stable for surgery.  I have reviewed the patient's chart and labs.  Questions were answered to the patient's satisfaction.     Gregory [name]

## 2020-02-09 NOTE — Anesthesia Procedure Notes (Signed)
Procedure Name: Intubation Date/Time: 02/09/2020 3:00 PM Performed by: Dorthea Cove, CRNA Pre-anesthesia Checklist: Patient identified, Emergency Drugs available, Suction available and Patient being monitored Patient Re-evaluated:Patient Re-evaluated prior to induction Oxygen Delivery Method: Circle system utilized Preoxygenation: Pre-oxygenation with 100% oxygen Induction Type: IV induction, Rapid sequence and Cricoid Pressure applied Laryngoscope Size: Mac and 4 Grade View: Grade I Tube type: Oral Number of attempts: 1 Airway Equipment and Method: Stylet and Oral airway Placement Confirmation: ETT inserted through vocal cords under direct vision,  positive ETCO2 and breath sounds checked- equal and bilateral Secured at: 21 cm Tube secured with: Tape Dental Injury: Teeth and Oropharynx as per pre-operative assessment

## 2020-02-09 NOTE — Anesthesia Preprocedure Evaluation (Addendum)
Anesthesia Evaluation  Patient identified by MRN, date of birth, ID band Patient awake    Reviewed: Allergy & Precautions, NPO status , Patient's Chart, lab work & pertinent test results  Airway Mallampati: I  TM Distance: >3 FB Neck ROM: Full    Dental   Pulmonary former smoker,    Pulmonary exam normal        Cardiovascular hypertension, Pt. on medications + CAD and + CABG  Normal cardiovascular exam     Neuro/Psych CVA    GI/Hepatic GERD  Medicated and Controlled,  Endo/Other    Renal/GU Renal InsufficiencyRenal disease     Musculoskeletal   Abdominal   Peds  Hematology   Anesthesia Other Findings   Reproductive/Obstetrics                            Anesthesia Physical Anesthesia Plan  ASA: III  Anesthesia Plan: General   Post-op Pain Management:    Induction: Intravenous  PONV Risk Score and Plan: 1 and Treatment may vary due to age or medical condition  Airway Management Planned: Oral ETT  Additional Equipment:   Intra-op Plan:   Post-operative Plan: Extubation in OR  Informed Consent: I have reviewed the patients History and Physical, chart, labs and discussed the procedure including the risks, benefits and alternatives for the proposed anesthesia with the patient or authorized representative who has indicated his/her understanding and acceptance.       Plan Discussed with: CRNA and Surgeon  Anesthesia Plan Comments:        Anesthesia Quick Evaluation

## 2020-02-09 NOTE — Consult Note (Signed)
Consultation  Referring Provider:     [facility] Primary Care Physician:  Darreld Mclean, MD Primary Gastroenterologist:        Silvano Rusk, MD Reason for Consultation:     Dysphagia, acute food impaction         HPI:   Gregory [name] is a 86 y.o. male with complex medical history to include CAD s/p CABG, atrial fibrillation (on Eliquis; last dose yesterday morning), CHF, HTN, HLD, nephrolithiasis, pulmonary hypertension, history of renal infarct, history of splenic infarct, SSS, history of CVA, GERD, presenting to the ER with acute food impaction.  Has a known history of GERD and esophageal stricture, along with prior history of food impactions.  Last EGD was 12/15/2018 (for acute food impaction) notable for peptic stricture in lower esophagus which was dilated with 15 mm TTS balloon.  Prior to that, EGD in 09/2017 by Dr. Ardis Hughs, also for food impaction.  Benign esophageal stricture also noted at that time.  Was last seen in the GI clinic on 01/26/2019.  At that time, he reported his dysphagia had resolved with the previous EGD with balloon dilation.  Today, he presents with recurrence of acute food impaction after eating [place] yesterday just prior to lunchtime.  Since then, has been unable to tolerate any p.o. intake, to include fluids and unable to tolerate secretions.  Symptoms similar to his multiple previous food impactions.  Covid test negative in the ER.  For his GERD, takes Protonix 40 mg p.o. twice daily.  No breakthrough reflux symptoms recently.   Past Medical History:  Diagnosis Date  . Acute respiratory failure ([place])   . Appendicitis with abscess 03/31/2011  . Arthritis   . CAD (coronary artery disease),CABG 1993-LIMA to the LAD, SVG to Om and SVG to PDA/PLA, patent on cath 2008. 2003   a. s/p CABG 2003. b. last cath 2008 with patent grafts.  . Chronic atrial fibrillation ([facility])    permanent  . Chronic systolic CHF (congestive heart failure) ([place])    a. Prior  low EF, later normalized.  . Essential hypertension   . GERD (gastroesophageal reflux disease)   . Heart murmur   . Hyperlipidemia   . Ischemic cardiomyopathy   . Kidney stones    "passed all but one time when he had to have lithotripsy" (01/21/2013)  . Pulmonary hypertension ([place])   . Renal infarct ([place])    a. 07/2015 - after holding Xarelto x 3 days for spinal procedure.  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 SSS (sick sinus syndrome) ([place])   . Stroke [facility]) 2007   "slightly drags left foot since; recovered qthing else" (01/21/2013)  . Syncope 07/2015   a. felt vasovagal in setting of pain from renal infarct.    Past Surgical History:  Procedure Laterality Date  . BALLOON DILATION N/A 05/22/2015   Procedure: BALLOON DILATION;  Surgeon: Gatha Mayer, MD;  Location: [facility] ENDOSCOPY;  Service: Endoscopy;  Laterality: N/A;  . BALLOON DILATION N/A 12/15/2018   Procedure: BALLOON DILATION;  Surgeon: Gatha Mayer, MD;  Location: [facility] ENDOSCOPY;  Service: Endoscopy;  Laterality: N/A;  . CARDIAC CATHETERIZATION  02/24/2002   reduced LV function, 60-70% prox RCA stenosis, 70% PLA ostial stenosis, 80% secondary branch of PLA stenosis - subsequent CABG (Dr. Jackie Plum)  . Carotid Doppler  06/2012   50-69% right bulb/prox ICA diameter reduction; 70-99% left bulb/prox ICA diameter reduction  . CATARACT EXTRACTION W/ INTRAOCULAR LENS IMPLANT Bilateral 2013  .  COLONOSCOPY N/A 05/18/2016   Procedure: COLONOSCOPY;  Surgeon: Manus Gunning, MD;  Location: [facility] ENDOSCOPY;  Service: Gastroenterology;  Laterality: N/A;  . CORONARY ANGIOPLASTY  07/05/2006   3 vessel CAD, patent LIMA to LAD, patentVG to OM, patent SVG to PDA & PLA, mild MR, severe LV systolic dysfunction (Dr. Gerrie Nordmann)  . CORONARY ARTERY BYPASS GRAFT  03/01/2002   LIMA to LAD, reverse SVG to OM, reverse SVG to PDA of RCA, reverse SVG to PLA of RCA, ligation of LA appendage (Dr. Servando Snare)  . ENTEROSCOPY N/A 05/20/2016   Procedure: ENTEROSCOPY;   Surgeon: Manus Gunning, MD;  Location: [place];  Service: Gastroenterology;  Laterality: N/A;  . ESOPHAGOGASTRODUODENOSCOPY  02/01/2012   Procedure: ESOPHAGOGASTRODUODENOSCOPY (EGD);  Surgeon: Beryle Beams, MD;  Location: [facility];  Service: Endoscopy;  Laterality: N/A;  . ESOPHAGOGASTRODUODENOSCOPY N/A 05/22/2015   Procedure: ESOPHAGOGASTRODUODENOSCOPY (EGD);  Surgeon: Gatha Mayer, MD;  Location: [facility];  Service: Endoscopy;  Laterality: N/A;  . ESOPHAGOGASTRODUODENOSCOPY N/A 05/17/2016   Procedure: ESOPHAGOGASTRODUODENOSCOPY (EGD);  Surgeon: Juanita Craver, MD;  Location[facility] ENDOSCOPY;  Service: Endoscopy;  Laterality: N/A;  . ESOPHAGOGASTRODUODENOSCOPY N/A 09/17/2017   Procedure: ESOPHAGOGASTRODUODENOSCOPY (EGD);  Surgeon: Milus Banister, MD;  Location: [facility] ENDOSCOPY;  Service: Endoscopy;  Laterality: N/A;  . ESOPHAGOGASTRODUODENOSCOPY (EGD) WITH PROPOFOL N/A 12/15/2018   Procedure: ESOPHAGOGASTRODUODENOSCOPY (EGD) WITH PROPOFOL;  Surgeon: Gatha Mayer, MD;  Location: [facility] ENDOSCOPY;  Service: Endoscopy;  Laterality: N/A;  . FOREIGN BODY REMOVAL  09/17/2017   Procedure: FOREIGN BODY REMOVAL;  Surgeon: Milus Banister, MD;  Location: [facility] ENDOSCOPY;  Service: Endoscopy;;  . FOREIGN BODY REMOVAL  12/15/2018   Procedure: FOREIGN BODY REMOVAL;  Surgeon: Gatha Mayer, MD;  Location: [facility] ENDOSCOPY;  Service: Endoscopy;;  . GIVENS CAPSULE STUDY N/A 05/18/2016   Procedure: GIVENS CAPSULE STUDY;  Surgeon: Manus Gunning, MD;  Location: [place];  Service: Gastroenterology;  Laterality: N/A;  . LAPAROSCOPIC APPENDECTOMY  03/30/2011   Procedure: APPENDECTOMY LAPAROSCOPIC;  Surgeon: Joyice Faster. Cornett, MD;  Location: [place];  Service: General;  Laterality: N/A;  . LEFT HEART CATHETERIZATION WITH CORONARY ANGIOGRAM N/A 01/24/2013   Procedure: LEFT HEART CATHETERIZATION WITH CORONARY ANGIOGRAM;  Surgeon: [name], MD;  Location: [facility] CATH LAB;  Service: Cardiovascular;   Laterality: N/A;  Right heart with grafts  . LITHOTRIPSY     "once" (01/21/2013)  . NM MYOCAR PERF WALL MOTION  05/2009   persantine myoview - fixed moderate perfusion defect in inferior wall & lateral segment of apex (poor non-transmural infarction), minimal anterolateral periinfarct reversible ischemia seen, abnormal study, defects similar to 2006 study  . RIGHT/LEFT HEART CATH AND CORONARY ANGIOGRAPHY N/A 09/17/2017   Procedure: RIGHT/LEFT HEART CATH AND CORONARY ANGIOGRAPHY;  Surgeon: Martinique, Peter M, MD;  Location: [place] CV LAB;  Service: Cardiovascular;  Laterality: N/A;  . SPLENECTOMY, TOTAL  01/2013  . TEE WITHOUT CARDIOVERSION N/A 01/23/2013   Procedure: TRANSESOPHAGEAL ECHOCARDIOGRAM (TEE);  Surgeon: Sanda Klein, MD;  Location: [facility] ENDOSCOPY;  Service: Cardiovascular;  Laterality: N/A;  . TRANSTHORACIC ECHOCARDIOGRAM  06/23/2012   EF 40-45%, mild LVH; mild AV regurg; mild MV regurg; LV mod-severely dilated; RV mildly dilated; systolic pressure borderline increased; RA mod dilated    Family History  Problem Relation Age of Onset  . Heart disease Father        rheumatic fever  . Heart attack Brother 9  . Stroke Mother   . Stroke Brother 62    Social History  Tobacco Use  . Smoking status: Former Smoker    Packs/day: 2.00    Years: 20.00    Pack years: 40.00    Types: Cigarettes    Quit date: 04/08/1986    Years since quitting: 33.8  . Smokeless tobacco: Current User    Types: Chew  Substance Use Topics  . Alcohol use: No    Alcohol/week: 0.0 standard drinks  . Drug use: No    Prior to Admission medications   Medication Sig Start Date End Date Taking? Authorizing Provider  apixaban (ELIQUIS) 2.5 MG TABS tablet TAKE 1 TABLET(2.5 MG) BY MOUTH TWICE DAILY 11/22/19   [name], [name], MD  carvedilol (COREG) 3.125 MG tablet Take 1 tablet (3.125 mg total) by mouth 2 (two) times daily with a meal. TAKE 1 TABLET(3.125 MG) BY MOUTH TWICE DAILY WITH A MEAL 11/22/19    [name], [name], MD  ezetimibe (ZETIA) 10 MG tablet Take 1 tablet (10 mg total) by mouth daily. 05/31/19   Troy Sine, MD  furosemide (LASIX) 40 MG tablet Take 20 mg by mouth.    [provider]  gabapentin (NEURONTIN) 300 MG capsule Take 300 mg by mouth at bedtime. 10/24/19   [provider]  levothyroxine (SYNTHROID) 25 MCG tablet Take 1 tablet (25 mcg total) by mouth daily before breakfast. 08/09/19   [name], [name], MD  lisinopril (ZESTRIL) 5 MG tablet Take 1 tablet (5 mg total) by mouth daily. 11/22/19   [name], [name], MD  pantoprazole (PROTONIX) 40 MG tablet Take 1 tablet (40 mg total) by mouth 2 (two) times daily. 05/31/19   Troy Sine, MD  rOPINIRole (REQUIP) 0.25 MG tablet TAKE 1 TABLET(0.25 MG) BY MOUTH TWICE DAILY 09/05/19   [name], [name], MD  rosuvastatin (CRESTOR) 40 MG tablet Take 1 tablet (40 mg total) by mouth daily. 11/22/19   [name], [name], MD  tiZANidine (ZANAFLEX) 4 MG tablet Take by mouth. 10/19/19   [provider]  traMADol (ULTRAM) 50 MG tablet TAKE ONE-HALF TABLET BY MOUTH EVERY 8 HOURS AS NEEDED FOR PAIN 11/02/19   [name], [name], MD    No current facility-administered medications for this encounter.   Current Outpatient Medications  Medication Sig Dispense Refill  . apixaban (ELIQUIS) 2.5 MG TABS tablet TAKE 1 TABLET(2.5 MG) BY MOUTH TWICE DAILY 180 tablet 1  . carvedilol (COREG) 3.125 MG tablet Take 1 tablet (3.125 mg total) by mouth 2 (two) times daily with a meal. TAKE 1 TABLET(3.125 MG) BY MOUTH TWICE DAILY WITH A MEAL 180 tablet 3  . ezetimibe (ZETIA) 10 MG tablet Take 1 tablet (10 mg total) by mouth daily. 90 tablet 3  . furosemide (LASIX) 40 MG tablet Take 20 mg by mouth.    . gabapentin (NEURONTIN) 300 MG capsule Take 300 mg by mouth at bedtime.    Marland Kitchen levothyroxine (SYNTHROID) 25 MCG tablet Take 1 tablet (25 mcg total) by mouth daily before breakfast. 90 tablet 1  . lisinopril (ZESTRIL) 5 MG tablet Take 1  tablet (5 mg total) by mouth daily. 90 tablet 3  . pantoprazole (PROTONIX) 40 MG tablet Take 1 tablet (40 mg total) by mouth 2 (two) times daily. 180 tablet 2  . rOPINIRole (REQUIP) 0.25 MG tablet TAKE 1 TABLET(0.25 MG) BY MOUTH TWICE DAILY 180 tablet 3  . rosuvastatin (CRESTOR) 40 MG tablet Take 1 tablet (40 mg total) by mouth daily. 90 tablet 3  . tiZANidine (ZANAFLEX) 4 MG tablet Take by mouth.    Marland Kitchen  traMADol (ULTRAM) 50 MG tablet TAKE ONE-HALF TABLET BY MOUTH EVERY 8 HOURS AS NEEDED FOR PAIN 30 tablet 1    Allergies as of 02/09/2020 - Review Complete 02/09/2020  Allergen Reaction Noted  . Percocet [oxycodone-acetaminophen] Itching and Nausea Only 04/09/2011     Review of Systems:    As per HPI, otherwise negative    Physical Exam:  Vital signs in last 24 hours: Temp:  [98.3 F (36.8 C)-98.6 F (37 C)] 98.6 F (37 C) (11/26 1340) Pulse Rate:  [81-84] 84 (11/26 1340) Resp:  [18] 18 (11/26 1340) BP: (157-172)/(82-89) 172/82 (11/26 1340) SpO2:  [95 %-96 %] 96 % (11/26 1340)   General:   Pleasant male in NAD Head:  Normocephalic and atraumatic. Eyes:   No icterus.   Conjunctiva pink. Ears:  Normal auditory acuity. Neck:  Supple Lungs:  Respirations even and unlabored. Lungs clear to auscultation bilaterally.   No wheezes, crackles, or rhonchi.  Heart:  Regular rate and rhythm; no MRG.  Midline incision from previous CABG Abdomen:  Soft, nondistended, nontender. Normal bowel sounds. No appreciable masses or hepatomegaly.  Rectal:  Not performed.  Msk:  Symmetrical without gross deformities.  Extremities:  Without edema. Neurologic:  Alert and  oriented x4;  grossly normal neurologically. Skin:  Intact without significant lesions or rashes. Psych:  Alert and cooperative. Normal affect.  LAB RESULTS: No results for input(s): WBC, HGB, HCT, PLT in the last 72 hours. BMET No results for input(s): NA, K, CL, CO2, GLUCOSE, BUN, CREATININE, CALCIUM in the last 72 hours. LFT No  results for input(s): PROT, ALBUMIN, AST, ALT, ALKPHOS, BILITOT, BILIDIR, IBILI in the last 72 hours. PT/INR No results for input(s): LABPROT, INR in the last 72 hours.  STUDIES: No results found.    Impression / Plan:   1) Acute esophageal food impaction 2) History of lower esophageal stricture 3) History of dysphagia  -Plan for emergent EGD for acute esophageal food impaction. -Additional recommendations pending endoscopic findings  The indications, risks, and benefits of EGD were explained to the patient and his daughter at bedside in detail. Risks include but are not limited to bleeding, perforation, adverse reaction to medications, and cardiopulmonary compromise. Sequelae include but are not limited to the possibility of surgery, hositalization, and mortality. The patient verbalized understanding and wished to proceed. All questions answered.  Gerrit Heck, DO, [facility]    LOS: 0 days   Lavena Bullion  02/09/2020, 2:14 PM

## 2020-02-09 NOTE — H&P (View-Only) (Signed)
Consultation  Referring Provider:     [facility] Primary Care Physician:  Darreld Mclean, MD Primary Gastroenterologist:        Silvano Rusk, MD Reason for Consultation:     Dysphagia, acute food impaction         HPI:   Gregory [name] is a 86 y.o. male with complex medical history to include CAD s/p CABG, atrial fibrillation (on Eliquis; last dose yesterday morning), CHF, HTN, HLD, nephrolithiasis, pulmonary hypertension, history of renal infarct, history of splenic infarct, SSS, history of CVA, GERD, presenting to the ER with acute food impaction.  Has a known history of GERD and esophageal stricture, along with prior history of food impactions.  Last EGD was 12/15/2018 (for acute food impaction) notable for peptic stricture in lower esophagus which was dilated with 15 mm TTS balloon.  Prior to that, EGD in 09/2017 by Dr. Ardis Hughs, also for food impaction.  Benign esophageal stricture also noted at that time.  Was last seen in the GI clinic on 01/26/2019.  At that time, he reported his dysphagia had resolved with the previous EGD with balloon dilation.  Today, he presents with recurrence of acute food impaction after eating [place] yesterday just prior to lunchtime.  Since then, has been unable to tolerate any p.o. intake, to include fluids and unable to tolerate secretions.  Symptoms similar to his multiple previous food impactions.  Covid test negative in the ER.  For his GERD, takes Protonix 40 mg p.o. twice daily.  No breakthrough reflux symptoms recently.   Past Medical History:  Diagnosis Date  . Acute respiratory failure ([place])   . Appendicitis with abscess 03/31/2011  . Arthritis   . CAD (coronary artery disease),CABG 1993-LIMA to the LAD, SVG to Om and SVG to PDA/PLA, patent on cath 2008. 2003   a. s/p CABG 2003. b. last cath 2008 with patent grafts.  . Chronic atrial fibrillation (H[facility])    permanent  . Chronic systolic CHF (congestive heart failure) ([place])    a. Prior  low EF, later normalized.  . Essential hypertension   . GERD (gastroesophageal reflux disease)   . Heart murmur   . Hyperlipidemia   . Ischemic cardiomyopathy   . Kidney stones    "passed all but one time when he had to have lithotripsy" (01/21/2013)  . Pulmonary hypertension ([place])   . 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 07/2015 - after holding Xarelto x 3 days for spinal procedure.  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 SSS (sick sinus syndrome) ([place])   . Stroke [facility]) 2007   "slightly drags left foot since; recovered qthing else" (01/21/2013)  . Syncope 07/2015   a. felt vasovagal in setting of pain from renal infarct.    Past Surgical History:  Procedure Laterality Date  . BALLOON DILATION N/A 05/22/2015   Procedure: BALLOON DILATION;  Surgeon: Gatha Mayer, MD;  Location: [facility] ENDOSCOPY;  Service: Endoscopy;  Laterality: N/A;  . BALLOON DILATION N/A 12/15/2018   Procedure: BALLOON DILATION;  Surgeon: Gatha Mayer, MD;  Location: [facility] ENDOSCOPY;  Service: Endoscopy;  Laterality: N/A;  . CARDIAC CATHETERIZATION  02/24/2002   reduced LV function, 60-70% prox RCA stenosis, 70% PLA ostial stenosis, 80% secondary branch of PLA stenosis - subsequent CABG (Dr. Jackie Plum)  . Carotid Doppler  06/2012   50-69% right bulb/prox ICA diameter reduction; 70-99% left bulb/prox ICA diameter reduction  . CATARACT EXTRACTION W/ INTRAOCULAR LENS IMPLANT Bilateral 2013  .  COLONOSCOPY N/A 05/18/2016   Procedure: COLONOSCOPY;  Surgeon: Manus Gunning, MD;  Location: [facility] ENDOSCOPY;  Service: Gastroenterology;  Laterality: N/A;  . CORONARY ANGIOPLASTY  07/05/2006   3 vessel CAD, patent LIMA to LAD, patentVG to OM, patent SVG to PDA & PLA, mild MR, severe LV systolic dysfunction (Dr. Gerrie Nordmann)  . CORONARY ARTERY BYPASS GRAFT  03/01/2002   LIMA to LAD, reverse SVG to OM, reverse SVG to PDA of RCA, reverse SVG to PLA of RCA, ligation of LA appendage (Dr. Servando Snare)  . ENTEROSCOPY N/A 05/20/2016   Procedure: ENTEROSCOPY;   Surgeon: Manus Gunning, MD;  Location: [place];  Service: Gastroenterology;  Laterality: N/A;  . ESOPHAGOGASTRODUODENOSCOPY  02/01/2012   Procedure: ESOPHAGOGASTRODUODENOSCOPY (EGD);  Surgeon: Beryle Beams, MD;  Location: [facility];  Service: Endoscopy;  Laterality: N/A;  . ESOPHAGOGASTRODUODENOSCOPY N/A 05/22/2015   Procedure: ESOPHAGOGASTRODUODENOSCOPY (EGD);  Surgeon: Gatha Mayer, MD;  Location: [facility];  Service: Endoscopy;  Laterality: N/A;  . ESOPHAGOGASTRODUODENOSCOPY N/A 05/17/2016   Procedure: ESOPHAGOGASTRODUODENOSCOPY (EGD);  Surgeon: Juanita Craver, MD;  Location[facility] ENDOSCOPY;  Service: Endoscopy;  Laterality: N/A;  . ESOPHAGOGASTRODUODENOSCOPY N/A 09/17/2017   Procedure: ESOPHAGOGASTRODUODENOSCOPY (EGD);  Surgeon: Milus Banister, MD;  Location: [facility] ENDOSCOPY;  Service: Endoscopy;  Laterality: N/A;  . ESOPHAGOGASTRODUODENOSCOPY (EGD) WITH PROPOFOL N/A 12/15/2018   Procedure: ESOPHAGOGASTRODUODENOSCOPY (EGD) WITH PROPOFOL;  Surgeon: Gatha Mayer, MD;  Location: [facility] ENDOS[facility];  Service: Endoscopy;  Laterality: N/A;  . FOREIGN BODY REMOVAL  09/17/2017   Procedure: FOREIGN BODY REMOVAL;  Surgeon: Milus Banister, MD;  Location: [facility] ENDOSCOPY;  Service: Endoscopy;;  . FOREIGN BODY REMOVAL  12/15/2018   Procedure: FOREIGN BODY REMOVAL;  Surgeon: Gatha Mayer, MD;  Location: [facility] ENDOSCOPY;  Service: Endoscopy;;  . GIVENS CAPSULE STUDY N/A 05/18/2016   Procedure: GIVENS CAPSULE STUDY;  Surgeon: Manus Gunning, MD;  Location: [place];  Service: Gastroenterology;  Laterality: N/A;  . LAPAROSCOPIC APPENDECTOMY  03/30/2011   Procedure: APPENDECTOMY LAPAROSCOPIC;  Surgeon: Joyice Faster. Cornett, MD;  Location: [place];  Service: General;  Laterality: N/A;  . LEFT HEART CATHETERIZATION WITH CORONARY ANGIOGRAM N/A 01/24/2013   Procedure: LEFT HEART CATHETERIZATION WITH CORONARY ANGIOGRAM;  Surgeon: [name], MD;  Location: [facility] CATH LAB;  Service: Cardiovascular;   Laterality: N/A;  Right heart with grafts  . LITHOTRIPSY     "once" (01/21/2013)  . NM MYOCAR PERF WALL MOTION  05/2009   persantine myoview - fixed moderate perfusion defect in inferior wall & lateral segment of apex (poor non-transmural infarction), minimal anterolateral periinfarct reversible ischemia seen, abnormal study, defects similar to 2006 study  . RIGHT/LEFT HEART CATH AND CORONARY ANGIOGRAPHY N/A 09/17/2017   Procedure: RIGHT/LEFT HEART CATH AND CORONARY ANGIOGRAPHY;  Surgeon: Martinique, Peter M, MD;  Location: [place] CV LAB;  Service: Cardiovascular;  Laterality: N/A;  . SPLENECTOMY, TOTAL  01/2013  . TEE WITHOUT CARDIOVERSION N/A 01/23/2013   Procedure: TRANSESOPHAGEAL ECHOCARDIOGRAM (TEE);  Surgeon: Sanda Klein, MD;  Location: [facility] ENDOSCOPY;  Service: Cardiovascular;  Laterality: N/A;  . TRANSTHORACIC ECHOCARDIOGRAM  06/23/2012   EF 40-45%, mild LVH; mild AV regurg; mild MV regurg; LV mod-severely dilated; RV mildly dilated; systolic pressure borderline increased; RA mod dilated    Family History  Problem Relation Age of Onset  . Heart disease Father        rheumatic fever  . Heart attack Brother 66  . Stroke Mother   . Stroke Brother 41    Social History  Tobacco Use  . Smoking status: Former Smoker    Packs/day: 2.00    Years: 20.00    Pack years: 40.00    Types: Cigarettes    Quit date: 04/08/1986    Years since quitting: 33.8  . Smokeless tobacco: Current User    Types: Chew  Substance Use Topics  . Alcohol use: No    Alcohol/week: 0.0 standard drinks  . Drug use: No    Prior to Admission medications   Medication Sig Start Date End Date Taking? Authorizing Provider  apixaban (ELIQUIS) 2.5 MG TABS tablet TAKE 1 TABLET(2.5 MG) BY MOUTH TWICE DAILY 11/22/19   [name], [name], MD  carvedilol (COREG) 3.125 MG tablet Take 1 tablet (3.125 mg total) by mouth 2 (two) times daily with a meal. TAKE 1 TABLET(3.125 MG) BY MOUTH TWICE DAILY WITH A MEAL 11/22/19    [name], [name], MD  ezetimibe (ZETIA) 10 MG tablet Take 1 tablet (10 mg total) by mouth daily. 05/31/19   Troy Sine, MD  furosemide (LASIX) 40 MG tablet Take 20 mg by mouth.    [provider]  gabapentin (NEURONTIN) 300 MG capsule Take 300 mg by mouth at bedtime. 10/24/19   [provider]  levothyroxine (SYNTHROID) 25 MCG tablet Take 1 tablet (25 mcg total) by mouth daily before breakfast. 08/09/19   [name], [name], MD  lisinopril (ZESTRIL) 5 MG tablet Take 1 tablet (5 mg total) by mouth daily. 11/22/19   [name], [name], MD  pantoprazole (PROTONIX) 40 MG tablet Take 1 tablet (40 mg total) by mouth 2 (two) times daily. 05/31/19   Troy Sine, MD  rOPINIRole (REQUIP) 0.25 MG tablet TAKE 1 TABLET(0.25 MG) BY MOUTH TWICE DAILY 09/05/19   [name], [name], MD  rosuvastatin (CRESTOR) 40 MG tablet Take 1 tablet (40 mg total) by mouth daily. 11/22/19   [name], [name], MD  tiZANidine (ZANAFLEX) 4 MG tablet Take by mouth. 10/19/19   [provider]  traMADol (ULTRAM) 50 MG tablet TAKE ONE-HALF TABLET BY MOUTH EVERY 8 HOURS AS NEEDED FOR PAIN 11/02/19   [name], [name], MD    No current facility-administered medications for this encounter.   Current Outpatient Medications  Medication Sig Dispense Refill  . apixaban (ELIQUIS) 2.5 MG TABS tablet TAKE 1 TABLET(2.5 MG) BY MOUTH TWICE DAILY 180 tablet 1  . carvedilol (COREG) 3.125 MG tablet Take 1 tablet (3.125 mg total) by mouth 2 (two) times daily with a meal. TAKE 1 TABLET(3.125 MG) BY MOUTH TWICE DAILY WITH A MEAL 180 tablet 3  . ezetimibe (ZETIA) 10 MG tablet Take 1 tablet (10 mg total) by mouth daily. 90 tablet 3  . furosemide (LASIX) 40 MG tablet Take 20 mg by mouth.    . gabapentin (NEURONTIN) 300 MG capsule Take 300 mg by mouth at bedtime.    Marland Kitchen levothyroxine (SYNTHROID) 25 MCG tablet Take 1 tablet (25 mcg total) by mouth daily before breakfast. 90 tablet 1  . lisinopril (ZESTRIL) 5 MG tablet Take 1  tablet (5 mg total) by mouth daily. 90 tablet 3  . pantoprazole (PROTONIX) 40 MG tablet Take 1 tablet (40 mg total) by mouth 2 (two) times daily. 180 tablet 2  . rOPINIRole (REQUIP) 0.25 MG tablet TAKE 1 TABLET(0.25 MG) BY MOUTH TWICE DAILY 180 tablet 3  . rosuvastatin (CRESTOR) 40 MG tablet Take 1 tablet (40 mg total) by mouth daily. 90 tablet 3  . tiZANidine (ZANAFLEX) 4 MG tablet Take by mouth.    Marland Kitchen  traMADol (ULTRAM) 50 MG tablet TAKE ONE-HALF TABLET BY MOUTH EVERY 8 HOURS AS NEEDED FOR PAIN 30 tablet 1    Allergies as of 02/09/2020 - Review Complete 02/09/2020  Allergen Reaction Noted  . Percocet [oxycodone-acetaminophen] Itching and Nausea Only 04/09/2011     Review of Systems:    As per HPI, otherwise negative    Physical Exam:  Vital signs in last 24 hours: Temp:  [98.3 F (36.8 C)-98.6 F (37 C)] 98.6 F (37 C) (11/26 1340) Pulse Rate:  [81-84] 84 (11/26 1340) Resp:  [18] 18 (11/26 1340) BP: (157-172)/(82-89) 172/82 (11/26 1340) SpO2:  [95 %-96 %] 96 % (11/26 1340)   General:   Pleasant male in NAD Head:  Normocephalic and atraumatic. Eyes:   No icterus.   Conjunctiva pink. Ears:  Normal auditory acuity. Neck:  Supple Lungs:  Respirations even and unlabored. Lungs clear to auscultation bilaterally.   No wheezes, crackles, or rhonchi.  Heart:  Regular rate and rhythm; no MRG.  Midline incision from previous CABG Abdomen:  Soft, nondistended, nontender. Normal bowel sounds. No appreciable masses or hepatomegaly.  Rectal:  Not performed.  Msk:  Symmetrical without gross deformities.  Extremities:  Without edema. Neurologic:  Alert and  oriented x4;  grossly normal neurologically. Skin:  Intact without significant lesions or rashes. Psych:  Alert and cooperative. Normal affect.  LAB RESULTS: No results for input(s): WBC, HGB, HCT, PLT in the last 72 hours. BMET No results for input(s): NA, K, CL, CO2, GLUCOSE, BUN, CREATININE, CALCIUM in the last 72 hours. LFT No  results for input(s): PROT, ALBUMIN, AST, ALT, ALKPHOS, BILITOT, BILIDIR, IBILI in the last 72 hours. PT/INR No results for input(s): LABPROT, INR in the last 72 hours.  STUDIES: No results found.    Impression / Plan:   1) Acute esophageal food impaction 2) History of lower esophageal stricture 3) History of dysphagia  -Plan for emergent EGD for acute esophageal food impaction. -Additional recommendations pending endoscopic findings  The indications, risks, and benefits of EGD were explained to the patient and his daughter at bedside in detail. Risks include but are not limited to bleeding, perforation, adverse reaction to medications, and cardiopulmonary compromise. Sequelae include but are not limited to the possibility of surgery, hositalization, and mortality. The patient verbalized understanding and wished to proceed. All questions answered.  Gerrit Heck, DO, [facility]    LOS: 0 days   Lavena Bullion  02/09/2020, 2:14 PM

## 2020-02-11 ENCOUNTER — Encounter (HOSPITAL_COMMUNITY): Payer: Self-pay | Admitting: Gastroenterology

## 2020-02-11 NOTE — Anesthesia Postprocedure Evaluation (Signed)
Anesthesia Post Note  Patient: Gregory [name]  Procedure(s) Performed: ESOPHAGOGASTRODUODENOSCOPY (EGD) (Left ) FOREIGN BODY REMOVAL     Patient location during evaluation: PACU Anesthesia Type: MAC Level of consciousness: awake and alert Pain management: pain level controlled Vital Signs Assessment: post-procedure vital signs reviewed and stable Respiratory status: spontaneous breathing, nonlabored ventilation, respiratory function stable and patient connected to nasal cannula oxygen Cardiovascular status: stable and blood pressure returned to baseline Postop Assessment: no apparent nausea or vomiting Anesthetic complications: no   No complications documented.  Last Vitals:  Vitals:   02/09/20 1555 02/09/20 1605  BP: (!) 159/53 (!) 175/84  Pulse: 82 77  Resp: 17 18  Temp:    SpO2: 100% 100%    Last Pain:  Vitals:   02/09/20 1605  TempSrc:   PainSc: 0-No pain                 Sumeya Yontz DAVID

## 2020-02-12 ENCOUNTER — Telehealth: Payer: Self-pay

## 2020-02-12 NOTE — Telephone Encounter (Signed)
-----   Message from [place], DO sent at 02/09/2020  3:43 PM EST ----- Please arrange for f/u with Dr. Carlean Purl or one of the APPs in the next 3-4 weeks or so along with repeat EGD with CG in 4-6 weeks for esophageal dilation. Should be discharged today with acute food impaction. Thanks!

## 2020-02-12 NOTE — Telephone Encounter (Signed)
I left a detailed message to call me back to set up appointments.

## 2020-02-13 NOTE — Telephone Encounter (Signed)
I spoke with his daughter [name], they have an appointment to see Dr Carlean Purl 02/22/2020. However when I tried to set up the EGD in the [place] she said in the past it has been done at the hospital due to his renal failure hx and his heart issues.. Tuesdays will not work for her as she has to take care of a "blind lady". Please advise Sir, thank you.

## 2020-02-14 NOTE — Telephone Encounter (Signed)
We will sort out scheduling of a hospital procedure off his anticoagulant at the December 9 visit

## 2020-02-14 NOTE — Telephone Encounter (Signed)
I left a detailed message for his daughter [name] with Dr Celesta Aver plan.

## 2020-02-22 ENCOUNTER — Ambulatory Visit (INDEPENDENT_AMBULATORY_CARE_PROVIDER_SITE_OTHER): Payer: Medicare Other | Admitting: Internal Medicine

## 2020-02-22 ENCOUNTER — Encounter: Payer: Self-pay | Admitting: Internal Medicine

## 2020-02-22 ENCOUNTER — Telehealth: Payer: Self-pay

## 2020-02-22 VITALS — BP 116/78 | HR 64 | Ht 65.0 in | Wt 190.2 lb

## 2020-02-22 DIAGNOSIS — Z8673 Personal history of transient ischemic attack (TIA), and cerebral infarction without residual deficits: Secondary | ICD-10-CM | POA: Diagnosis not present

## 2020-02-22 DIAGNOSIS — I482 Chronic atrial fibrillation, unspecified: Secondary | ICD-10-CM

## 2020-02-22 DIAGNOSIS — K222 Esophageal obstruction: Secondary | ICD-10-CM

## 2020-02-22 DIAGNOSIS — R1319 Other dysphagia: Secondary | ICD-10-CM

## 2020-02-22 DIAGNOSIS — K219 Gastro-esophageal reflux disease without esophagitis: Secondary | ICD-10-CM | POA: Diagnosis not present

## 2020-02-22 DIAGNOSIS — T18128S Food in esophagus causing other injury, sequela: Secondary | ICD-10-CM

## 2020-02-22 DIAGNOSIS — Z7901 Long term (current) use of anticoagulants: Secondary | ICD-10-CM

## 2020-02-22 NOTE — Progress Notes (Addendum)
Gregory [name] 87 y.o. 02/01/1933 [id number]  Assessment & Plan:   Encounter Diagnoses  Name Primary?  . GERD with stricture Yes  . Esophageal dysphagia   . Food impaction of esophagus, sequela   . Chronic atrial fibrillation ([place])   . Long term current use of anticoagulant therapy - apixaban   . History of stroke    The patient is having recurrent dysphagia in the setting of known GERD and stricture.  In the past he has been dilated to 15 mm and had a good response but it obviously did not last.  Situation is complicated by need for apixaban in the setting of A. fib and previous stroke and renal infarct.    I think the best plan of action is to schedule for EGD with dilation.  I have carefully reviewed his chart he has had no anesthesia problems, he has a decent EF and says he can walk up steps slowly without getting too short of breath.  So we will schedule this for the [name].  In addition to improving his dysphagia and reducing the risk of trauma and bleeding related to that, he has had to miss anticoagulant doses in the past due to food impaction so we can minimize that as well.  Hold apixaban 1 day prior to procedure rather than 2 given his history of stroke and splenic infarct when off anticoagulation before.  I think this is the best plan considering the overall situation.  We will clarify with Dr. Shelva [name] regarding that plan to hold apixaban.  The patient and his daughter are aware of the risk of stroke being off his anticoagulation therapy and also the risk of bleeding with the procedure and the usual risks of endoscopy have been explained including perforation reactions to medications.  Overall he will be at  higher risk of complications such as stroke and bleeding than typical I think given his past medical history but again given his problems with dysphagia I think this is the best plan.  I have provided a minced and moist foods dysphagia eating plan  until he can be dilated.  I appreciate the opportunity to care for this patient. CC: [name], [name], MD Gregory Majestic, MD   Subjective:   Chief Complaint: Dysphagia recurrent food impactions sounds great thanks FYI they did an alignment as well.  I do not know exactly what happened to the tire.  As tire rotation  HPI 86 year old white man with a history of GERD with stricture and prior food impactions and dilation, as well as chronic anticoagulation for atrial fibrillation, stroke, splenic infarct, coronary artery disease with history of coronary artery bypass grafting, chronic kidney disease, and sick sinus syndrome and prior stroke. He had a recurrent food impaction on February 09, 2020 with Dr. Bryan [name] remove the food revealing a persistent ringlike stricture he had a small rent in the upper esophagus from food removal. Carafate was added to his twice daily PPI. He had been dilated to 15 mm on December 15, 2018.  He did well for a while after that 2020 dilation and declined repeat dilation when I saw him in follow-up as he was eating well without difficulty.  At some point he developed recurrent frequent dysphagia again and has been waiting for hours in the food usually passes.  It sounds like in general he tries to modify his diet but perhaps not as much as he needs to.  Right now he admitted to having dysphagia  to up piece of banana the other day.  Does not seem to have heartburn symptoms on his PPI.   Daughter is here with him and participates and indicates he has had a stroke and a renal infarct when he is held his anticoagulation in the past.  He might have been on warfarin then as they were holding it for 3 days.  The most he is been off since his 36 hours and that has been when he has not been able to swallow due to food impaction.  That actually happened in 2020 and I performed his procedure at the hospital on was able to dilate because he had been off his Eliquis.   He is able to  walk up steps slowly he says without getting too short of breath he can sleep flat he is not on oxygen review of recent anesthesia note shows that he has not had a difficult intubation.  He is currently trying to quit chewing tobacco.  Formally a smoker then switched to oral tobacco now quitting that. Allergies  Allergen Reactions  . Percocet [Oxycodone-Acetaminophen] Itching and Nausea Only   Current Meds  Medication Sig  . apixaban (ELIQUIS) 2.5 MG TABS tablet TAKE 1 TABLET(2.5 MG) BY MOUTH TWICE DAILY  . carvedilol (COREG) 3.125 MG tablet Take 1 tablet (3.125 mg total) by mouth 2 (two) times daily with a meal. TAKE 1 TABLET(3.125 MG) BY MOUTH TWICE DAILY WITH A MEAL  . ezetimibe (ZETIA) 10 MG tablet Take 1 tablet (10 mg total) by mouth daily.  . furosemide (LASIX) 40 MG tablet Take 20 mg by mouth.  . gabapentin (NEURONTIN) 300 MG capsule Take 300 mg by mouth at bedtime.  Marland Kitchen levothyroxine (SYNTHROID) 25 MCG tablet Take 1 tablet (25 mcg total) by mouth daily before breakfast.  . lisinopril (ZESTRIL) 5 MG tablet Take 1 tablet (5 mg total) by mouth daily.  . pantoprazole (PROTONIX) 40 MG tablet Take 1 tablet (40 mg total) by mouth 2 (two) times daily.  Marland Kitchen rOPINIRole (REQUIP) 0.25 MG tablet TAKE 1 TABLET(0.25 MG) BY MOUTH TWICE DAILY  . rosuvastatin (CRESTOR) 40 MG tablet Take 1 tablet (40 mg total) by mouth daily.  . sucralfate (CARAFATE) 1 GM/10ML suspension Take 10 mLs (1 g total) by mouth 2 (two) times daily.  Marland Kitchen tiZANidine (ZANAFLEX) 4 MG tablet Take by mouth.  . traMADol (ULTRAM) 50 MG tablet TAKE ONE-HALF TABLET BY MOUTH EVERY 8 HOURS AS NEEDED FOR PAIN   Past Medical History:  Diagnosis Date  . Acute respiratory failure ([place])   . Appendicitis with abscess 03/31/2011  . Arthritis   . CAD (coronary artery disease),CABG 1993-LIMA to the LAD, SVG to Om and SVG to PDA/PLA, patent on cath 2008. 2003   a. s/p CABG 2003. b. last cath 2008 with patent grafts.  . Chronic atrial fibrillation  ([facility])    permanent  . Chronic systolic CHF (congestive heart failure) ([place])    a. Prior low EF, later normalized.  . Essential hypertension   . Food impaction of esophagus   . GERD (gastroesophageal reflux disease)   . Heart murmur   . Hyperlipidemia   . Ischemic cardiomyopathy   . Kidney stones    "passed all but one time when he had to have lithotripsy" (01/21/2013)  . Renal infarct ([place])    a. 07/2015 - after holding Xarelto x 3 days for spinal procedure.  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 SSS (sick sinus syndrome) ([place])   . Stroke [facility])  2007   "slightly drags left foot since; recovered qthing else" (01/21/2013)  . Syncope 07/2015   a. felt vasovagal in setting of pain from renal infarct.   Past Surgical History:  Procedure Laterality Date  . BALLOON DILATION N/A 05/22/2015   Procedure: BALLOON DILATION;  Surgeon: Gatha Mayer, MD;  Location: [facility];  Service: Endoscopy;  Laterality: N/A;  . BALLOON DILATION N/A 12/15/2018   Procedure: BALLOON DILATION;  Surgeon: Gatha Mayer, MD;  Location: [facility];  Service: Endoscopy;  Laterality: N/A;  . CARDIAC CATHETERIZATION  02/24/2002   reduced LV function, 60-70% prox RCA stenosis, 70% PLA ostial stenosis, 80% secondary branch of PLA stenosis - subsequent CABG (Dr. Jackie Plum)  . Carotid Doppler  06/2012   50-69% right bulb/prox ICA diameter reduction; 70-99% left bulb/prox ICA diameter reduction  . CATARACT EXTRACTION W/ INTRAOCULAR LENS IMPLANT Bilateral 2013  . COLONOSCOPY N/A 05/18/2016   Procedure: COLONOSCOPY;  Surgeon: Manus Gunning, MD;  Location: [facility] ENDOSCOPY;  Service: Gastroenterology;  Laterality: N/A;  . CORONARY ANGIOPLASTY  07/05/2006   3 vessel CAD, patent LIMA to LAD, patentVG to OM, patent SVG to PDA & PLA, mild MR, severe LV systolic dysfunction (Dr. Gerrie Nordmann)  . CORONARY ARTERY BYPASS GRAFT  03/01/2002   LIMA to LAD, reverse SVG to OM, reverse SVG to PDA of RCA, reverse SVG to PLA of RCA, ligation of LA  appendage (Dr. Servando Snare)  . ENTEROSCOPY N/A 05/20/2016   Procedure: ENTEROSCOPY;  Surgeon: Manus Gunning, MD;  Location: [place];  Service: Gastroenterology;  Laterality: N/A;  . ESOPHAGOGASTRODUODENOSCOPY  02/01/2012   Procedure: ESOPHAGOGASTRODUODENOSCOPY (EGD);  Surgeon: Beryle Beams, MD;  Location: [facility];  Service: Endoscopy;  Laterality: N/A;  . ESOPHAGOGASTRODUODENOSCOPY N/A 05/22/2015   Procedure: ESOPHAGOGASTRODUODENOSCOPY (EGD);  Surgeon: Gatha Mayer, MD;  Location: [facility];  Service: Endoscopy;  Laterality: N/A;  . ESOPHAGOGASTRODUODENOSCOPY N/A 05/17/2016   Procedure: ESOPHAGOGASTRODUODENOSCOPY (EGD);  Surgeon: Juanita Craver, MD;  Location[facility] ENDOSCOPY;  Service: Endoscopy;  Laterality: N/A;  . ESOPHAGOGASTRODUODENOSCOPY N/A 09/17/2017   Procedure: ESOPHAGOGASTRODUODENOSCOPY (EGD);  Surgeon: Milus Banister, MD;  Location: [facility] ENDOSCOPY;  Service: Endoscopy;  Laterality: N/A;  . ESOPHAGOGASTRODUODENOSCOPY Left 02/09/2020   Procedure: ESOPHAGOGASTRODUODENOSCOPY (EGD);  Surgeon: Lavena Bullion, DO;  Location: [facility] ENDOSCOPY;  Service: Gastroenterology;  Laterality: Left;  . ESOPHAGOGASTRODUODENOSCOPY (EGD) WITH PROPOFOL N/A 12/15/2018   Procedure: ESOPHAGOGASTRODUODENOSCOPY (EGD) WITH PROPOFOL;  Surgeon: Gatha Mayer, MD;  Location: [facility];  Service: Endoscopy;  Laterality: N/A;  . FOREIGN BODY REMOVAL  09/17/2017   Procedure: FOREIGN BODY REMOVAL;  Surgeon: Milus Banister, MD;  Location: [facility] ENDOSCOPY;  Service: Endoscopy;;  . FOREIGN BODY REMOVAL  12/15/2018   Procedure: FOREIGN BODY REMOVAL;  Surgeon: Gatha Mayer, MD;  Location: [facility];  Service: Endoscopy;;  . FOREIGN BODY REMOVAL  02/09/2020   Procedure: FOREIGN BODY REMOVAL;  Surgeon: Lavena Bullion, DO;  Location: [place];  Service: Gastroenterology;;  . Freda Munro CAPSULE STUDY N/A 05/18/2016   Procedure: GIVENS CAPSULE STUDY;  Surgeon: Manus Gunning, MD;  Location: [place];  Service: Gastroenterology;  Laterality: N/A;  . LAPAROSCOPIC APPENDECTOMY  03/30/2011   Procedure: APPENDECTOMY LAPAROSCOPIC;  Surgeon: Joyice Faster. Cornett, MD;  Location: [place];  Service: General;  Laterality: N/A;  . LEFT HEART CATHETERIZATION WITH CORONARY ANGIOGRAM N/A 01/24/2013   Procedure: LEFT HEART CATHETERIZATION WITH CORONARY ANGIOGRAM;  Surgeon: [name], MD;  Location: [facility] CATH LAB;  Service: Cardiovascular;  Laterality: N/A;  Right heart with grafts  . LITHOTRIPSY     "once" (01/21/2013)  . NM MYOCAR PERF WALL MOTION  05/2009   persantine myoview - fixed moderate perfusion defect in inferior wall & lateral segment of apex (poor non-transmural infarction), minimal anterolateral periinfarct reversible ischemia seen, abnormal study, defects similar to 2006 study  . RIGHT/LEFT HEART CATH AND CORONARY ANGIOGRAPHY N/A 09/17/2017   Procedure: RIGHT/LEFT HEART CATH AND CORONARY ANGIOGRAPHY;  Surgeon: Martinique, Peter M, MD;  Location: [place] CV LAB;  Service: Cardiovascular;  Laterality: N/A;  . SPLENECTOMY, TOTAL  01/2013  . TEE WITHOUT CARDIOVERSION N/A 01/23/2013   Procedure: TRANSESOPHAGEAL ECHOCARDIOGRAM (TEE);  Surgeon: Sanda Klein, MD;  Location: [facility] ENDOSCOPY;  Service: Cardiovascular;  Laterality: N/A;  . TRANSTHORACIC ECHOCARDIOGRAM  06/23/2012   EF 40-45%, mild LVH; mild AV regurg; mild MV regurg; LV mod-severely dilated; RV mildly dilated; systolic pressure borderline increased; RA mod dilated   Social History   Social History Narrative    married, 5 sons 4 daughters. He is retired Horticulturist, commercial. 2-3 caffeinated beverages a day no alcohol   family history includes Heart attack (age of onset: 58) in his brother; Heart disease in his father; Stroke in his mother; Stroke (age of onset: 73) in his brother.   Review of Systems As per HPI  Objective:   Physical Exam BP 116/78 (BP Location: Left Arm, Patient Position: Sitting, Cuff Size: Normal)   Pulse 64  Comment: irregular  Ht 5\' 5"  (1.651 m) Comment: height measured without shoes  Wt 190 lb 4 oz (86.3 kg)   BMI 31.66 kg/m  Elderly white man no acute distress Eyes anicteric Mouth shows well fitting dentures Lungs are clear with good air movement Heart normal S1-S2 perhaps slightly irregular rhythm no murmurs or gallops He is awake alert and oriented x3   Data reviewed as per HPI

## 2020-02-22 NOTE — Telephone Encounter (Signed)
Patient with diagnosis of afib on Eliquis for anticoagulation.    Procedure: endoscopy Date of procedure: 02/28/20  CHA2DS2-VASc Score = 7  This indicates a 11.2% annual risk of stroke. The patient's score is based upon: CHF History: Yes HTN History: Yes Diabetes History: No Stroke History: Yes Vascular Disease History: Yes Age Score: 2 Gender Score: 0  CrCl 94mL/min Platelet count 211K  Per office protocol, patient can hold Eliquis for 1 day prior to procedure. Resume as soon as safely possible due to elevated CV risk off of anticoagulation (previously suffered a renal infarct when he stopped DOAC for 3 days for prior procedure).

## 2020-02-22 NOTE — Telephone Encounter (Signed)
Patient's daughter Knute Neu # [phone] notified and she will tell her Dad. She verbalized understanding.

## 2020-02-22 NOTE — Telephone Encounter (Signed)
Pharmacy, can you please comment on how long Eliquis can be held for upcoming procedure?  Thank you! 

## 2020-02-22 NOTE — Patient Instructions (Addendum)
You have been scheduled for an endoscopy. Please follow written instructions given to you at your visit today. If you use inhalers (even only as needed), please bring them with you on the day of your procedure.  You will be contacted by our office prior to your procedure for directions on holding your Eliquis. If you do not hear from our office 1 week prior to your scheduled procedure, please call [phone] to discuss.  We are giving you information to read on dysphagia eating plans.  I appreciate the opportunity to care for you. Silvano Rusk, MD, [facility]

## 2020-02-22 NOTE — Telephone Encounter (Signed)
[facility] Pre-operative Risk Assessment     Request for surgical clearance:     Endoscopy Procedure  What type of surgery is being performed?     EGD  When is this surgery scheduled?     02/28/2020  What type of clearance is required ?   Pharmacy  Are there any medications that need to be held prior to surgery and how long? Eliquis, 1 day  Practice name and name of physician performing surgery?      [facility]  What is your office phone and fax number?      Phone- [phone]  [phone]  Anesthesia type (None, local, MAC, general) ?       MAC

## 2020-02-22 NOTE — Telephone Encounter (Signed)
   Primary Cardiologist: Shelva Majestic, MD  Chart reviewed as part of pre-operative protocol coverage. Patient has EGD scheduled for 02/28/2020 and we were asked to give our recommendation for holding Eliquis.   Per Pharmacy and office protocol: Patient can hold Eliquis for 1 day prior to procedure. Resume as soon as safely possible due to elevated CV risk off of anticoagulation (previously suffered a renal infarct when he stopped DOAC for 3 days for prior procedure).  I will route this recommendation to the requesting party via Epic fax function and remove from pre-op pool.  Please call with questions.  Darreld Mclean, PA-C 02/22/2020, 11:52 AM

## 2020-02-26 ENCOUNTER — Other Ambulatory Visit: Payer: Self-pay | Admitting: Internal Medicine

## 2020-02-26 DIAGNOSIS — Z1159 Encounter for screening for other viral diseases: Secondary | ICD-10-CM | POA: Diagnosis not present

## 2020-02-26 LAB — SARS CORONAVIRUS 2 (TAT 6-24 HRS): SARS Coronavirus 2: NEGATIVE

## 2020-02-28 ENCOUNTER — Other Ambulatory Visit: Payer: Self-pay

## 2020-02-28 ENCOUNTER — Ambulatory Visit (AMBULATORY_SURGERY_CENTER): Payer: Medicare Other | Admitting: Internal Medicine

## 2020-02-28 ENCOUNTER — Encounter: Payer: Self-pay | Admitting: Internal Medicine

## 2020-02-28 VITALS — BP 124/51 | HR 62 | Temp 97.8°F | Resp 14 | Ht 65.0 in | Wt 190.0 lb

## 2020-02-28 DIAGNOSIS — K222 Esophageal obstruction: Secondary | ICD-10-CM

## 2020-02-28 DIAGNOSIS — K2289 Other specified disease of esophagus: Secondary | ICD-10-CM | POA: Diagnosis not present

## 2020-02-28 DIAGNOSIS — K219 Gastro-esophageal reflux disease without esophagitis: Secondary | ICD-10-CM

## 2020-02-28 DIAGNOSIS — R1310 Dysphagia, unspecified: Secondary | ICD-10-CM | POA: Diagnosis not present

## 2020-02-28 DIAGNOSIS — K2210 Ulcer of esophagus without bleeding: Secondary | ICD-10-CM | POA: Diagnosis not present

## 2020-02-28 DIAGNOSIS — T18128S Food in esophagus causing other injury, sequela: Secondary | ICD-10-CM

## 2020-02-28 DIAGNOSIS — R1319 Other dysphagia: Secondary | ICD-10-CM | POA: Diagnosis not present

## 2020-02-28 MED ORDER — SODIUM CHLORIDE 0.9 % IV SOLN: 500.0000 mL | Freq: Once | INTRAVENOUS | Status: DC

## 2020-02-28 NOTE — Progress Notes (Signed)
Medical history reviewed with no changes noted. VS assessed by C.W 

## 2020-02-28 NOTE — Op Note (Signed)
[facility] Patient Name: Gregory [name] Procedure Date: 02/28/2020 3:54 PM MRN: [id number] Endoscopist: Gatha Mayer , MD Age: 86 Referring MD:  Date of Birth: 1932/06/13 Gender: Male Account #: [id number] Procedure:                Upper GI endoscopy Indications:              Dysphagia, Stricture of the esophagus, For therapy                            of esophageal stricture Medicines:                Propofol per Anesthesia, Monitored Anesthesia Care Procedure:                Pre-Anesthesia Assessment:                           - Prior to the procedure, a History and Physical                            was performed, and patient medications and                            allergies were reviewed. The patient's tolerance of                            previous anesthesia was also reviewed. The risks                            and benefits of the procedure and the sedation                            options and risks were discussed with the patient.                            All questions were answered, and informed consent                            was obtained. Prior Anticoagulants: The patient                            last took Eliquis (apixaban) 1 day prior to the                            procedure. ASA Grade Assessment: III - A patient                            with severe systemic disease. After reviewing the                            risks and benefits, the patient was deemed in                            satisfactory condition to undergo the procedure.  After obtaining informed consent, the endoscope was                            passed under direct vision. Throughout the                            procedure, the patient's blood pressure, pulse, and                            oxygen saturations were monitored continuously. The                            Endoscope was introduced through the mouth, and                             advanced to the second part of duodenum. The upper                            GI endoscopy was accomplished without difficulty.                            The patient tolerated the procedure well. Scope In: Scope Out: Findings:                 Two benign-appearing, intrinsic moderate stenoses                            were found in the distal esophagus. The narrowest                            stenosis measured 1.3 cm (inner diameter). The                            stenoses were traversed. A TTS dilator was passed                            through the scope. Dilation with a 13.5-14.5-15.5                            mm balloon and a 16-17-18 mm balloon dilator was                            performed to 17 mm. The dilation site was examined                            and showed moderate mucosal disruption and moderate                            improvement in luminal narrowing. Estimated blood                            loss was minimal.  One cratered esophageal ulcer with no stigmata of                            recent bleeding was found in the distal esophagus.                            The lesion was 5 mm in largest dimension. Biopsies                            were taken with a cold forceps for histology.                            Verification of patient identification for the                            specimen was done. Estimated blood loss was minimal.                           A 2 cm hiatal hernia was present.                           The exam was otherwise without abnormality.                           The cardia and gastric fundus were otherwise normal                            on retroflexion. Complications:            No immediate complications. Estimated Blood Loss:     Estimated blood loss was minimal. Impression:               - Benign-appearing esophageal stenoses. Two                            consecutive rings - Dilated.                            - Esophageal ulcer with no stigmata of recent                            bleeding. Biopsied.                           - 2 cm hiatal hernia.                           - The examination was otherwise normal. Recommendation:           - Patient has a contact number available for                            emergencies. The signs and symptoms of potential                            delayed complications were discussed with  the                            patient. Return to normal activities tomorrow.                            Written discharge instructions were provided to the                            patient.                           - Clear liquids x 1 hour then soft foods rest of                            day. Start prior diet tomorrow.                           - Continue present medications.                           - Resume Eliquis (apixaban) at prior dose tomorrow.                           - Await pathology results.                           - MAY NEED REPEAT DILATION NEXT MONTH - WILL                            CONTACT AFTER PATH RESULTS IN                           STAY OIN BID PPI Gatha Mayer, MD 02/28/2020 4:45:24 PM This report has been signed electronically.

## 2020-02-28 NOTE — Progress Notes (Signed)
A/ox3, pleased with MAC, report to RN 

## 2020-02-28 NOTE — Patient Instructions (Addendum)
I stretched the esophagus so you can swallow better.  I took some biopsies of the esophagus also.  Once I get that information we will call you/family and plan follow-up.  Might need another stretch in January.  Restart Eliquis tomorrow.  Stay on pantoprazole twice a day. You can lay off the sucralfate.  I appreciate the opportunity to care for you. Gatha Mayer, MD, [facility]  You may resume your eliquis tomorrow.   YOU HAD AN ENDOSCOPIC PROCEDURE TODAY AT [facility]:   Refer to the procedure report that was given to you for any specific questions about what was found during the examination.  If the procedure report does not answer your questions, please call your gastroenterologist to clarify.  If you requested that your care partner not be given the details of your procedure findings, then the procedure report has been included in a sealed envelope for you to review at your convenience later.  YOU SHOULD EXPECT: Some feelings of bloating in the abdomen. Passage of more gas than usual.  Walking can help get rid of the air that was put into your GI tract during the procedure and reduce the bloating.  Please Note:  You might notice some irritation and congestion in your nose or some drainage.  This is from the oxygen used during your procedure.  There is no need for concern and it should clear up in a day or so.  SYMPTOMS TO REPORT IMMEDIATELY:    Following upper endoscopy (EGD)  Vomiting of blood or coffee ground material  New chest pain or pain under the shoulder blades  Painful or persistently difficult swallowing  New shortness of breath  Fever of 100F or higher  Black, tarry-looking stools  For urgent or emergent issues, a gastroenterologist can be reached at any hour by calling [phone]. Do not use MyChart messaging for urgent concerns.    DIET:  We do recommend nothing by mouth until 5:30p.  From 5:50 to 6:30, clear liquids.  You may resume a  normal diet tomorrow. Drink plenty of fluids but you should avoid alcoholic beverages for 24 hours.  ACTIVITY:  You should plan to take it easy for the rest of today and you should NOT DRIVE or use heavy machinery until tomorrow (because of the sedation medicines used during the test).    FOLLOW UP: Our staff will call the number listed on your records 48-72 hours following your procedure to check on you and address any questions or concerns that you may have regarding the information given to you following your procedure. If we do not reach you, we will leave a message.  We will attempt to reach you two times.  During this call, we will ask if you have developed any symptoms of COVID 19. If you develop any symptoms (ie: fever, flu-like symptoms, shortness of breath, cough etc.) before then, please call [phone].  If you test positive for Covid 19 in the 2 weeks post procedure, please call and report this information to [place].    If any biopsies were taken you will be contacted by phone or by letter within the next 1-3 weeks.  Please call us at [phone] if you have not heard about the biopsies in 3 weeks.    SIGNATURES/CONFIDENTIALITY: You and/or your care partner have signed paperwork which will be entered into your electronic medical record.  These signatures attest to the fact that that the information above on your After Visit Summary  has been reviewed and is understood.  Full responsibility of the confidentiality of this discharge information lies with you and/or your care-partner. 

## 2020-03-01 ENCOUNTER — Telehealth: Payer: Self-pay

## 2020-03-01 NOTE — Telephone Encounter (Signed)
  Follow up Call-  Call back number 02/28/2020  Post procedure Call Back phone  # [phone]  Permission to leave phone message Yes  Some recent data might be hidden     Patient questions:  Do you have a fever, pain , or abdominal swelling? No. Pain Score  0 *  Have you tolerated food without any problems? Yes.    Have you been able to return to your normal activities? Yes.    Do you have any questions about your discharge instructions: Diet   No. Medications  No. Follow up visit  No.  Do you have questions or concerns about your Care? No.  Actions: * If pain score is 4 or above: No action needed, pain <4.   1. Have you developed a fever since your procedure? no  2.   Have you had an respiratory symptoms (SOB or cough) since your procedure? no  3.   Have you tested positive for COVID 19 since your procedure no  4.   Have you had any family members/close contacts diagnosed with the COVID 19 since your procedure?  no   If yes to any of these questions please route to Joylene John, RN and Joella Prince, RN

## 2020-03-22 ENCOUNTER — Telehealth: Payer: Self-pay | Admitting: Cardiovascular Disease

## 2020-03-22 NOTE — Telephone Encounter (Signed)
Patient calling the office for samples of medication:   1.  What medication and dosage are you requesting samples for? apixaban (ELIQUIS) 2.5 MG TABS tablet   2.  Are you currently out of this medication? No    

## 2020-03-22 NOTE — Telephone Encounter (Signed)
Patient aware samples are at the front desk for pick up  

## 2020-03-25 DIAGNOSIS — N183 Chronic kidney disease, stage 3 unspecified: Secondary | ICD-10-CM | POA: Diagnosis not present

## 2020-04-03 ENCOUNTER — Other Ambulatory Visit: Payer: Self-pay | Admitting: Cardiovascular Disease

## 2020-04-03 DIAGNOSIS — I129 Hypertensive chronic kidney disease with stage 1 through stage 4 chronic kidney disease, or unspecified chronic kidney disease: Secondary | ICD-10-CM | POA: Diagnosis not present

## 2020-04-03 DIAGNOSIS — N2581 Secondary hyperparathyroidism of renal origin: Secondary | ICD-10-CM | POA: Diagnosis not present

## 2020-04-03 DIAGNOSIS — Z7901 Long term (current) use of anticoagulants: Secondary | ICD-10-CM

## 2020-04-03 DIAGNOSIS — N183 Chronic kidney disease, stage 3 unspecified: Secondary | ICD-10-CM | POA: Diagnosis not present

## 2020-04-03 DIAGNOSIS — D631 Anemia in chronic kidney disease: Secondary | ICD-10-CM | POA: Diagnosis not present

## 2020-04-03 MED ORDER — APIXABAN 2.5 MG PO TABS: ORAL_TABLET | ORAL | 1 refills | Status: DC

## 2020-04-03 MED ORDER — PANTOPRAZOLE SODIUM 40 MG PO TBEC: 40.0000 mg | DELAYED_RELEASE_TABLET | Freq: Two times a day (BID) | ORAL | 2 refills | Status: DC

## 2020-04-03 NOTE — Telephone Encounter (Signed)
*  STAT* If patient is at the pharmacy, call can be transferred to refill team.   1. Which medications need to be refilled? (please list name of each medication and dose if known)  apixaban (ELIQUIS) 2.5 MG TABS tablet pantoprazole (PROTONIX) 40 MG tablet    2. Which pharmacy/location (including street and city if local pharmacy) is medication to be sent to? [facility] [facility], [place] - [address] AT [place]  3. Do they need a 30 day or 90 day supply?   apixaban (ELIQUIS) 2.5 MG TABS tablet 30 day supply  pantoprazole (PROTONIX) 40 MG tablet 90 day supply  Patient is out of Pantoprazole.

## 2020-04-05 ENCOUNTER — Other Ambulatory Visit: Payer: Self-pay | Admitting: Cardiovascular Disease

## 2020-05-19 NOTE — Progress Notes (Signed)
[place] [facility] [address], [place], [place] [address] [phone]  Date:  05/22/2020   Name:  Gregory [name]   DOB:  Nov 02, 1932   MRN:  [id number]  PCP:  Darreld Mclean, MD    Chief Complaint: Hypothyroidism (6 month follow up/)   History of Present Illness:  Gregory [name] is a 87 y.o. very pleasant male patient who presents with the following:  Pt here today for a periodic follow-up visit  Last visit with myself about 6 months ago  Accompanied today by his daughter Gregory [name].  He does not typically drive a car any longer, but he is still very active on their land doing numerous projects  Patient with history of CAD status post CABG 2003, A. 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 Carlean Purl did an endoscopy in December and performed an esophageal dilation- he is swallowing better now   covid series- not done yet, I did recommend that he do this  Flu vaccine - done for this year  Per most recent cardiology note, October CAD s/p CABG - last heart cath with patent grafts - no ASA in the setting of eliquis - no angina Ischemic cardiomyopathy - EF has fluctuated from 20-45% over the years - renal function has precluded use of entresto - he is maintained on BB and ACEI Carotid artery stenosis  - followed by [facility] - no dizziness or syncope - he does not wish to proceed with intervention Hyperlipidemia with LDL goal < 70 05/17/2019: VLDL 20.4 11/22/2019: Cholesterol 140; HDL 55; LDL Cholesterol (Calc) 67; Triglycerides 92 - 40 mg crestor and 10 mg zetia Permanent Afib - 2.5 mg eliquis for age and renal function - no palpitations, no bleeding In the donut hole - will attempt to  provide samples of eliquis.  Eliquis 2.5 twice daily Carvedilol zetia Crestor Lasix? Gabapentin? Levothyroxine 25 Lisinopril Protonix Requip   He uses tramadol on occasion for back pain.  He also was given Zanaflex recently to use for some cramping in his right leg.  This is thought to be related to his back.  He wonders if he can get a refill of Zanaflex today Patient Active Problem List   Diagnosis Date Noted  . GERD with stricture 01/26/2019  . Hypothyroid 12/12/2018  . Prediabetes 12/04/2018  . Esophageal obstruction due to food impaction   . Chronic anemia 01/13/2016  . Chronic renal failure, stage 3 (moderate) ([place]) 08/06/2015  . Vasovagal syncope   . 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 Syncope 07/30/2015  . Esophageal dysphagia   . Esophageal stricture   . Carotid stenosis 07/13/2013  . Anticoagulated on Eliquis 03/21/2013  . PVD - 25% LICA, 42% RICA by doppler 5/14 01/24/2013  . 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 CAD (coronary artery disease),CABG 1993-LIMA to the LAD, SVG to Om and SVG to PDA/PLA, patent on cath 2014. 03/31/2011  . Chronic a-fib ([place]) 03/31/2011  . Cardiomyopathy, ischemic, improved 03/31/2011  . CVA (cerebral vascular accident) ([place]) 03/31/2011  . Renal calculi 03/31/2011  . History of tobacco use, continues with chewing tobacco 03/31/2011    Past Medical History:  Diagnosis Date  . Acute respiratory failure ([place])   . Appendicitis with abscess 03/31/2011  .  Arthritis   . CAD (coronary artery disease),CABG 1993-LIMA to the LAD, SVG to Om and SVG to PDA/PLA, patent on cath 2008. 2003   a. s/p CABG 2003. b. last cath 2008 with patent grafts.  . Chronic atrial fibrillation (HCC)    permanent  . Chronic systolic CHF (congestive heart failure) ([place])    a. Prior low EF, later normalized.  . Essential hypertension   . Food impaction of esophagus   . GERD (gastroesophageal reflux  disease)   . Heart murmur   . Hyperlipidemia   . Ischemic cardiomyopathy   . Kidney stones    "passed all but one time when he had to have lithotripsy" (01/21/2013)  . Renal infarct ([place])    a. 07/2015 - after holding Xarelto x 3 days for spinal procedure.  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 SSS (sick sinus syndrome) ([place])   . Stroke [facility]) 2007   "slightly drags left foot since; recovered qthing else" (01/21/2013)  . Syncope 07/2015   a. felt vasovagal in setting of pain from renal infarct.    Past Surgical History:  Procedure Laterality Date  . BALLOON DILATION N/A 05/22/2015   Procedure: BALLOON DILATION;  Surgeon: Gatha Mayer, MD;  Location: [facility] ENDOSCOPY;  Service: Endoscopy;  Laterality: N/A;  . BALLOON DILATION N/A 12/15/2018   Procedure: BALLOON DILATION;  Surgeon: Gatha Mayer, MD;  Location: [facility] ENDOSCOPY;  Service: Endoscopy;  Laterality: N/A;  . CARDIAC CATHETERIZATION  02/24/2002   reduced LV function, 60-70% prox RCA stenosis, 70% PLA ostial stenosis, 80% secondary branch of PLA stenosis - subsequent CABG (Dr. Jackie Plum)  . Carotid Doppler  06/2012   50-69% right bulb/prox ICA diameter reduction; 70-99% left bulb/prox ICA diameter reduction  . CATARACT EXTRACTION W/ INTRAOCULAR LENS IMPLANT Bilateral 2013  . COLONOSCOPY N/A 05/18/2016   Procedure: COLONOSCOPY;  Surgeon: Manus Gunning, MD;  Location: [facility] ENDOSCOPY;  Service: Gastroenterology;  Laterality: N/A;  . CORONARY ANGIOPLASTY  07/05/2006   3 vessel CAD, patent LIMA to LAD, patentVG to OM, patent SVG to PDA & PLA, mild MR, severe LV systolic dysfunction (Dr. Gerrie Nordmann)  . CORONARY ARTERY BYPASS GRAFT  03/01/2002   LIMA to LAD, reverse SVG to OM, reverse SVG to PDA of RCA, reverse SVG to PLA of RCA, ligation of LA appendage (Dr. Servando Snare)  . ENTEROSCOPY N/A 05/20/2016   Procedure: ENTEROSCOPY;  Surgeon: Manus Gunning, MD;  Location: [place];  Service: Gastroenterology;  Laterality: N/A;  .  ESOPHAGOGASTRODUODENOSCOPY  02/01/2012   Procedure: ESOPHAGOGASTRODUODENOSCOPY (EGD);  Surgeon: Beryle Beams, MD;  Location: [facility];  Service: Endoscopy;  Laterality: N/A;  . ESOPHAGOGASTRODUODENOSCOPY N/A 05/22/2015   Procedure: ESOPHAGOGASTRODUODENOSCOPY (EGD);  Surgeon: Gatha Mayer, MD;  Location: [facility];  Service: Endoscopy;  Laterality: N/A;  . ESOPHAGOGASTRODUODENOSCOPY N/A 05/17/2016   Procedure: ESOPHAGOGASTRODUODENOSCOPY (EGD);  Surgeon: Juanita Craver, MD;  Location: [facility] ENDOSCOPY;  Service: Endoscopy;  Laterality: N/A;  . ESOPHAGOGASTRODUODENOSCOPY N/A 09/17/2017   Procedure: ESOPHAGOGASTRODUODENOSCOPY (EGD);  Surgeon: Milus Banister, MD;  Location: [facility] ENDOSCOPY;  Service: Endoscopy;  Laterality: N/A;  . ESOPHAGOGASTRODUODENOSCOPY Left 02/09/2020   Procedure: ESOPHAGOGASTRODUODENOSCOPY (EGD);  Surgeon: Lavena Bullion, DO;  Location[facility] END[facility]COPY;  Service: Gastroenterology;  Laterality: Left;  . ESOPHAGOGASTRODUODENOSCOPY (EGD) WITH PROPOFOL N/A 12/15/2018   Procedure: ESOPHAGOGASTRODUODENOSCOPY (EGD) WITH PROPOFOL;  Surgeon: Gatha Mayer, MD;  Location: [facility] ENDOS[facility];  Service: Endoscopy;  Laterality: N/A;  . FOREIGN BODY REMOVAL  09/17/2017   Procedure: FOREIGN BODY REMOVAL;  Surgeon: Milus Banister, MD;  Location[facility] ENDOSCOPY;  Service: Endoscopy;;  . FOREIGN BODY REMOVAL  12/15/2018   Procedure: FOREIGN BODY REMOVAL;  Surgeon: Gatha Mayer, MD;  Location: [facility] ENDOSCOPY;  Service: Endoscopy;;  . FOREIGN BODY REMOVAL  02/09/2020   Procedure: FOREIGN BODY REMOVAL;  Surgeon: Lavena Bullion, DO;  Location: [place];  Service: Gastroenterology;;  . Freda Munro CAPSULE STUDY N/A 05/18/2016   Procedure: GIVENS CAPSULE STUDY;  Surgeon: Manus Gunning, MD;  Location: [place];  Service: Gastroenterology;  Laterality: N/A;  . LAPAROSCOPIC APPENDECTOMY  03/30/2011   Procedure: APPENDECTOMY LAPAROSCOPIC;  Surgeon: Joyice Faster. Cornett, MD;  Location: [place];  Service:  General;  Laterality: N/A;  . LEFT HEART CATHETERIZATION WITH CORONARY ANGIOGRAM N/A 01/24/2013   Procedure: LEFT HEART CATHETERIZATION WITH CORONARY ANGIOGRAM;  Surgeon: [name], MD;  Location: [facility] CATH LAB;  Service: Cardiovascular;  Laterality: N/A;  Right heart with grafts  . LITHOTRIPSY     "once" (01/21/2013)  . NM MYOCAR PERF WALL MOTION  05/2009   persantine myoview - fixed moderate perfusion defect in inferior wall & lateral segment of apex (poor non-transmural infarction), minimal anterolateral periinfarct reversible ischemia seen, abnormal study, defects similar to 2006 study  . RIGHT/LEFT HEART CATH AND CORONARY ANGIOGRAPHY N/A 09/17/2017   Procedure: RIGHT/LEFT HEART CATH AND CORONARY ANGIOGRAPHY;  Surgeon: Martinique, Peter M, MD;  Location: [place] CV LAB;  Service: Cardiovascular;  Laterality: N/A;  . SPLENECTOMY, TOTAL  01/2013  . TEE WITHOUT CARDIOVERSION N/A 01/23/2013   Procedure: TRANSESOPHAGEAL ECHOCARDIOGRAM (TEE);  Surgeon: Sanda Klein, MD;  Location: [facility] ENDOSCOPY;  Service: Cardiovascular;  Laterality: N/A;  . TRANSTHORACIC ECHOCARDIOGRAM  06/23/2012   EF 40-45%, mild LVH; mild AV regurg; mild MV regurg; LV mod-severely dilated; RV mildly dilated; systolic pressure borderline increased; RA mod dilated    Social History   Tobacco Use  . Smoking status: Former Smoker    Packs/day: 2.00    Years: 20.00    Pack years: 40.00    Types: Cigarettes    Quit date: 04/08/1986    Years since quitting: 34.1  . Smokeless tobacco: Current User    Types: Chew  Substance Use Topics  . Alcohol use: No    Alcohol/week: 0.0 standard drinks  . Drug use: No    Family History  Problem Relation Age of Onset  . Heart disease Father        rheumatic fever  . Heart attack Brother 79  . Stroke Mother   . Stroke Brother 55  . Stomach cancer Neg Hx   . Rectal cancer Neg Hx   . Prostate cancer Neg Hx   . Pancreatic cancer Neg Hx   . Ovarian cancer Neg Hx     Allergies   Allergen Reactions  . Percocet [Oxycodone-Acetaminophen] Itching and Nausea Only    Medication list has been reviewed and updated.  Current Outpatient Medications on File Prior to Visit  Medication Sig Dispense Refill  . apixaban (ELIQUIS) 2.5 MG TABS tablet TAKE 1 TABLET(2.5 MG) BY MOUTH TWICE DAILY 180 tablet 1  . carvedilol (COREG) 3.125 MG tablet Take 1 tablet (3.125 mg total) by mouth 2 (two) times daily with a meal. TAKE 1 TABLET(3.125 MG) BY MOUTH TWICE DAILY WITH A MEAL 180 tablet 3  . ezetimibe (ZETIA) 10 MG tablet Take 1 tablet (10 mg total) by mouth daily. 90 tablet 3  . furosemide (LASIX) 40 MG tablet Take 20 mg by mouth.    Marland Kitchen  levothyroxine (SYNTHROID) 25 MCG tablet Take 1 tablet (25 mcg total) by mouth daily before breakfast. 90 tablet 1  . lisinopril (ZESTRIL) 5 MG tablet Take 1 tablet (5 mg total) by mouth daily. 90 tablet 3  . pantoprazole (PROTONIX) 40 MG tablet Take 1 tablet (40 mg total) by mouth 2 (two) times daily. 180 tablet 2  . rOPINIRole (REQUIP) 0.25 MG tablet TAKE 1 TABLET(0.25 MG) BY MOUTH TWICE DAILY 180 tablet 3  . rosuvastatin (CRESTOR) 40 MG tablet Take 1 tablet (40 mg total) by mouth daily. 90 tablet 3   No current facility-administered medications on file prior to visit.    Review of Systems:  As per HPI- otherwise negative.   Physical Examination: Vitals:   05/22/20 1306  BP: 128/82  Pulse: 77  Resp: 17  SpO2: 98%   Vitals:   05/22/20 1306  Weight: 193 lb (87.5 kg)  Height: 5\' 5"  (1.651 m)   Body mass index is 32.12 kg/m. Ideal Body Weight: Weight in (lb) to have BMI = 25: 149.9  GEN: no acute distress.  Looks well  HEENT: Atraumatic, Normocephalic.  Ears and Nose: No external deformity. CV: RRR, No M/G/R. No JVD. No thrill. No extra heart sounds. PULM: CTA B, no wheezes, crackles, rhonchi. No retractions. No resp. distress. No accessory muscle use. ABD: S, NT, ND, +BS. No rebound. No HSM. EXTR: No c/c/e PSYCH: Normally  interactive. Conversant.  Right calf is nontender, no swelling or cords.  Normal pulse and foot  Assessment and Plan: Acquired hypothyroidism - Plan: TSH  Persistent atrial fibrillation (HCC)  Anticoagulated - Plan: CBC  Chronic kidney disease, unspecified CKD stage - Plan: Comprehensive metabolic panel  Prediabetes - Plan: Hemoglobin A1c  Cardiomyopathy, ischemic - Plan: CBC  Dyslipidemia - Plan: Lipid panel  Chronic back pain, unspecified back location, unspecified back pain laterality - Plan: tiZANidine (ZANAFLEX) 4 MG tablet, traMADol (ULTRAM) 50 MG tablet  Older gentleman here today for periodic follow-up visit.  No major changes, he is generally feeling well Refilled tramadol and Zanaflex, cautioned not to use them both together Blood pressure is well controlled Routine labs are pending as above-check TSH to monitor thyroid, renal function Assuming all is well, plan to visit in 6 months Will plan further follow- up pending labs.  This visit occurred during the SARS-CoV-2 public health emergency.  Safety protocols were in place, including screening questions prior to the visit, additional usage of staff PPE, and extensive cleaning of exam room while observing appropriate contact time as indicated for disinfecting solutions.    Signed Lamar Blinks, MD   Received labs as below, letter to patient  Results for orders placed or performed in visit on 05/22/20  CBC  Result Value Ref Range   WBC 5.5 4.0 - 10.5 K/uL   RBC 3.67 (L) 4.22 - 5.81 Mil/uL   Platelets 195.0 150.0 - 400.0 K/uL   Hemoglobin 11.8 (L) 13.0 - 17.0 g/dL   HCT 34.6 (L) 39.0 - 52.0 %   MCV 94.2 78.0 - 100.0 fl   MCHC 34.2 30.0 - 36.0 g/dL   RDW 14.1 11.5 - 15.5 %  Hemoglobin A1c  Result Value Ref Range   Hgb A1c MFr Bld 6.2 4.6 - 6.5 %  Comprehensive metabolic panel  Result Value Ref Range   Sodium 142 135 - 145 mEq/L   Potassium 3.9 3.5 - 5.1 mEq/L   Chloride 107 96 - 112 mEq/L   CO2 27 19 -  32 mEq/L  Glucose, Bld 113 (H) 70 - 99 mg/dL   BUN 17 6 - 23 mg/dL   Creatinine, Ser 1.37 0.40 - 1.50 mg/dL   Total Bilirubin 0.5 0.2 - 1.2 mg/dL   Alkaline Phosphatase 79 39 - 117 U/L   AST 17 0 - 37 U/L   ALT 10 0 - 53 U/L   Total Protein 6.5 6.0 - 8.3 g/dL   Albumin 3.6 3.5 - 5.2 g/dL   GFR 46.84 (L) >60.00 mL/min   Calcium 8.7 8.4 - 10.5 mg/dL  TSH  Result Value Ref Range   TSH 3.00 0.35 - 4.50 uIU/mL  Lipid panel  Result Value Ref Range   Cholesterol 96 0 - 200 mg/dL   Triglycerides 130.0 0.0 - 149.0 mg/dL   HDL 37.10 (L) >39.00 mg/dL   VLDL 26.0 0.0 - 40.0 mg/dL   LDL Cholesterol 33 0 - 99 mg/dL   Total CHOL/HDL Ratio 3    NonHDL 59.03

## 2020-05-19 NOTE — Patient Instructions (Addendum)
Good to see you again- please see me in about 6 months assuming all is well  I refilled your tramadol to use as needed for pain You can use the tizanidine for cramping and pain in your calves as needed- do not combine with tramadol  Take care!

## 2020-05-22 ENCOUNTER — Ambulatory Visit (INDEPENDENT_AMBULATORY_CARE_PROVIDER_SITE_OTHER): Payer: Medicare Other | Admitting: Family Medicine

## 2020-05-22 ENCOUNTER — Other Ambulatory Visit: Payer: Self-pay

## 2020-05-22 ENCOUNTER — Encounter: Payer: Self-pay | Admitting: Family Medicine

## 2020-05-22 VITALS — BP 128/82 | HR 77 | Resp 17 | Ht 65.0 in | Wt 193.0 lb

## 2020-05-22 DIAGNOSIS — R7303 Prediabetes: Secondary | ICD-10-CM | POA: Diagnosis not present

## 2020-05-22 DIAGNOSIS — E785 Hyperlipidemia, unspecified: Secondary | ICD-10-CM | POA: Diagnosis not present

## 2020-05-22 DIAGNOSIS — I4819 Other persistent atrial fibrillation: Secondary | ICD-10-CM | POA: Diagnosis not present

## 2020-05-22 DIAGNOSIS — M549 Dorsalgia, unspecified: Secondary | ICD-10-CM

## 2020-05-22 DIAGNOSIS — D649 Anemia, unspecified: Secondary | ICD-10-CM | POA: Diagnosis not present

## 2020-05-22 DIAGNOSIS — N189 Chronic kidney disease, unspecified: Secondary | ICD-10-CM | POA: Diagnosis not present

## 2020-05-22 DIAGNOSIS — I255 Ischemic cardiomyopathy: Secondary | ICD-10-CM

## 2020-05-22 DIAGNOSIS — Z7901 Long term (current) use of anticoagulants: Secondary | ICD-10-CM | POA: Diagnosis not present

## 2020-05-22 DIAGNOSIS — G8929 Other chronic pain: Secondary | ICD-10-CM

## 2020-05-22 DIAGNOSIS — E039 Hypothyroidism, unspecified: Secondary | ICD-10-CM | POA: Diagnosis not present

## 2020-05-22 LAB — COMPREHENSIVE METABOLIC PANEL
ALT: 10 U/L (ref 0–53)
AST: 17 U/L (ref 0–37)
Albumin: 3.6 g/dL (ref 3.5–5.2)
Alkaline Phosphatase: 79 U/L (ref 39–117)
BUN: 17 mg/dL (ref 6–23)
CO2: 27 mEq/L (ref 19–32)
Calcium: 8.7 mg/dL (ref 8.4–10.5)
Chloride: 107 mEq/L (ref 96–112)
Creatinine, Ser: 1.37 mg/dL (ref 0.40–1.50)
GFR: 46.84 mL/min — ABNORMAL LOW (ref >60.00)
Glucose, Bld: 113 mg/dL — ABNORMAL HIGH (ref 70–99)
Potassium: 3.9 mEq/L (ref 3.5–5.1)
Sodium: 142 mEq/L (ref 135–145)
Total Bilirubin: 0.5 mg/dL (ref 0.2–1.2)
Total Protein: 6.5 g/dL (ref 6.0–8.3)

## 2020-05-22 LAB — LIPID PANEL
Cholesterol: 96 mg/dL (ref 0–200)
HDL: 37.1 mg/dL — ABNORMAL LOW (ref >39.00)
LDL Cholesterol: 33 mg/dL (ref 0–99)
NonHDL: 59.03
Total CHOL/HDL Ratio: 3
Triglycerides: 130 mg/dL (ref 0.0–149.0)
VLDL: 26 mg/dL (ref 0.0–40.0)

## 2020-05-22 LAB — CBC
HCT: 34.6 % — ABNORMAL LOW (ref 39.0–52.0)
Hemoglobin: 11.8 g/dL — ABNORMAL LOW (ref 13.0–17.0)
MCHC: 34.2 g/dL (ref 30.0–36.0)
MCV: 94.2 fl (ref 78.0–100.0)
Platelets: 195 10*3/uL (ref 150.0–400.0)
RBC: 3.67 Mil/uL — ABNORMAL LOW (ref 4.22–5.81)
RDW: 14.1 % (ref 11.5–15.5)
WBC: 5.5 10*3/uL (ref 4.0–10.5)

## 2020-05-22 LAB — TSH: TSH: 3 u[IU]/mL (ref 0.35–4.50)

## 2020-05-22 LAB — HEMOGLOBIN A1C: Hgb A1c MFr Bld: 6.2 % (ref 4.6–6.5)

## 2020-05-22 MED ORDER — TRAMADOL HCL 50 MG PO TABS: ORAL_TABLET | ORAL | 1 refills | Status: DC

## 2020-05-22 MED ORDER — TIZANIDINE HCL 4 MG PO TABS: 4.0000 mg | ORAL_TABLET | Freq: Three times a day (TID) | ORAL | 1 refills | Status: DC | PRN

## 2020-05-23 ENCOUNTER — Telehealth: Payer: Self-pay

## 2020-05-23 NOTE — Telephone Encounter (Signed)
Stool cards mailed to patient

## 2020-05-23 NOTE — Telephone Encounter (Signed)
-----   Message from Darreld Mclean, MD sent at 05/22/2020  6:58 PM EST ----- Please send him stool cards in the mail, I have already ordered

## 2020-06-07 DIAGNOSIS — H18413 Arcus senilis, bilateral: Secondary | ICD-10-CM | POA: Diagnosis not present

## 2020-06-07 DIAGNOSIS — H26492 Other secondary cataract, left eye: Secondary | ICD-10-CM | POA: Diagnosis not present

## 2020-06-07 DIAGNOSIS — H04123 Dry eye syndrome of bilateral lacrimal glands: Secondary | ICD-10-CM | POA: Diagnosis not present

## 2020-06-07 DIAGNOSIS — H26493 Other secondary cataract, bilateral: Secondary | ICD-10-CM | POA: Diagnosis not present

## 2020-06-07 DIAGNOSIS — Z961 Presence of intraocular lens: Secondary | ICD-10-CM | POA: Diagnosis not present

## 2020-06-07 DIAGNOSIS — H35372 Puckering of macula, left eye: Secondary | ICD-10-CM | POA: Diagnosis not present

## 2020-06-14 ENCOUNTER — Encounter: Payer: Self-pay | Admitting: Cardiovascular Disease

## 2020-06-15 ENCOUNTER — Other Ambulatory Visit: Payer: Self-pay | Admitting: Cardiovascular Disease

## 2020-06-18 DIAGNOSIS — H26491 Other secondary cataract, right eye: Secondary | ICD-10-CM | POA: Diagnosis not present

## 2020-06-19 ENCOUNTER — Ambulatory Visit: Payer: Medicare Other | Admitting: Cardiovascular Disease

## 2020-07-02 ENCOUNTER — Ambulatory Visit: Payer: Medicare Other | Admitting: Student

## 2020-07-04 DIAGNOSIS — M48061 Spinal stenosis, lumbar region without neurogenic claudication: Secondary | ICD-10-CM | POA: Diagnosis not present

## 2020-07-08 DIAGNOSIS — H353112 Nonexudative age-related macular degeneration, right eye, intermediate dry stage: Secondary | ICD-10-CM | POA: Diagnosis not present

## 2020-07-08 DIAGNOSIS — H35373 Puckering of macula, bilateral: Secondary | ICD-10-CM | POA: Diagnosis not present

## 2020-07-08 DIAGNOSIS — H348322 Tributary (branch) retinal vein occlusion, left eye, stable: Secondary | ICD-10-CM | POA: Diagnosis not present

## 2020-07-08 DIAGNOSIS — H353121 Nonexudative age-related macular degeneration, left eye, early dry stage: Secondary | ICD-10-CM | POA: Diagnosis not present

## 2020-08-17 ENCOUNTER — Other Ambulatory Visit: Payer: Self-pay | Admitting: Family Medicine

## 2020-08-17 ENCOUNTER — Other Ambulatory Visit: Payer: Self-pay | Admitting: Cardiovascular Disease

## 2020-08-17 DIAGNOSIS — E785 Hyperlipidemia, unspecified: Secondary | ICD-10-CM

## 2020-08-17 DIAGNOSIS — G2581 Restless legs syndrome: Secondary | ICD-10-CM

## 2020-08-28 ENCOUNTER — Ambulatory Visit: Payer: Medicare Other | Admitting: Cardiovascular Disease

## 2020-08-28 ENCOUNTER — Other Ambulatory Visit: Payer: Self-pay

## 2020-08-28 ENCOUNTER — Encounter: Payer: Self-pay | Admitting: Cardiovascular Disease

## 2020-08-28 DIAGNOSIS — Z7901 Long term (current) use of anticoagulants: Secondary | ICD-10-CM | POA: Diagnosis not present

## 2020-08-28 DIAGNOSIS — I25810 Atherosclerosis of coronary artery bypass graft(s) without angina pectoris: Secondary | ICD-10-CM | POA: Diagnosis not present

## 2020-08-28 DIAGNOSIS — Z951 Presence of aortocoronary bypass graft: Secondary | ICD-10-CM | POA: Diagnosis not present

## 2020-08-28 DIAGNOSIS — I4821 Permanent atrial fibrillation: Secondary | ICD-10-CM | POA: Diagnosis not present

## 2020-08-28 DIAGNOSIS — E785 Hyperlipidemia, unspecified: Secondary | ICD-10-CM

## 2020-08-28 DIAGNOSIS — I6523 Occlusion and stenosis of bilateral carotid arteries: Secondary | ICD-10-CM | POA: Diagnosis not present

## 2020-08-28 DIAGNOSIS — N289 Disorder of kidney and ureter, unspecified: Secondary | ICD-10-CM

## 2020-08-28 NOTE — Progress Notes (Signed)
Patient ID: Gregory [name], male   DOB: 16-Feb-1933, 87 y.o.   MRN: [id number]     HPI: Gregory [name] is a 87 y.o. male who presents for a 15 month follow-up cardiology evaluation.  [name] has a history of known CAD in 2003 underwent CABG revascularization surgery by Dr. Servando Snare  (LIMA  to  LAD, vein graft to the obtuse marginal, sequential vein graft to the PDA and PLA of RCA).  His last cardiac catheterization was in 2008 done by Dr. Tami Ribas. At that time, ejection fraction was 20-25%. He declined consideration for ICD. Ejection fraction improved to approximately 35-40% in September 2012 on medical therapy. In the past, he has refused Coumadin therapy and had been maintained on aspirin and Plavix. In 2013 he was hospitalized for appendicitis with abscess requiring laparoscopic appendectomy. He was hospitalized in November 2014 and was felt to have a splenic infarct of embolic etiology. He underwent a transesophageal echocardiogram on 01/23/2013 by Dr. Sallyanne Kuster which revealed an ejection fraction in the range of 15-25%. There was diffuse hypokinesis with severe hypokinesis to akinesis of the anteroseptal apical myocardium. He had increased left ventricular end-diastolic filling pressure and elevated left atrial filling pressure. There was evidence for large solid fixed thrombus occupying the entire atrial appendage with moderate continuous spontaneous echo contrast ("smoke") in the left atrial cavity. There was no evidence for right atrial thrombus. At that time, the patient refused life vest.  He has been on Xarelto for anticoagulation.He denies chest pain. He denies significant shortness of breath. He is unaware of tachdysrhythmias.   He has permanent atrial fibrillation, but his rate has been controlled.  He denies any presyncope or syncope.  He denies any chest pain.  A followup echo Doppler study on 06/14/2013 revealed significant improvement in LV function with an ejection fraction estimated now  at 50-55% without regional wall motion abnormalities.  He did have mild aortic root dilatation, mild aortic insufficiency, and mild mitral regurgitation with biatrial enlargement.   He has severe bilateral carotid artery disease.  He had been seen by Dr. Oneida Alar.  His prior carotid duplex scan was significantly abnormal with his left carotid velocities increased to 683 systolic and 419 diastolic with severe fibrous plaque and elevated velocities within all segments of his left internal carotid.  There also was 50-69% diameter reduction in the right bulb/proximal ICA. I referred him to Dr. Gwenlyn Found who subsequently referred him to Dr. Trula Slade.  He denies chest pain.  He denies palpitations.  He denies shortness of breath.  He continues to say he is asymptomatic with reference to his carotid disease and has refused intervention.   A follow-up Doppler study on 08/23/2014 showed increased velocities in the left ar  492 over 148 at the MICA level and to 30/67, at the PICA level.  On the right, his PICA velocity was 292/78.  When compared to his prior study.  This has not increased and has remained fairly stable.  He denies chest pain or palpitations.  He denies paresthesias.  He denies visual symptoms.  When I saw him in follow-up,  he remained asymptomatic.  He has refused to consider any potential carotid intervention or surgery.  He was unaware of any fast heartbeats with his chronic atrial fibrillation.  He denies bleeding.  A carotid study earlier today continued to demonstrate severe stenosis with peak  velocity in the left MICA at 500/122 and PICA 241/34. On the right,PICA was 293/106.  At his last office visit,  I recommended discontinuing of simvastatin in attempt to be more aggressive with lipid lowering and started Crestor 40 mg daily.  He was hospitalized May 2017  after sustaining a renal infarct with acute kidney injury after taking 3 days off Xarelto for pain management injection to his back.  He had  developed mild anemia with a hemoglobin drop of 3 points leading to a subsequent emergency room evaluation.  A CT of his abdomen and pelvis did not show acute findings or signs of peritoneal bleeding.  He denied any episodes of chest pain,  paresthesias, presyncope or syncope. Or bleeding  An echo Doppler study in May 2017 showed an EF of 55-60%.  There was mild aortic insufficiency, mild aortic root dilatation, mitral annular calcification with mild MR.   On a subsequent office visit he began to consider the possibility of maybe in the future thinking about carotid surgery, but he was not ready yet to have this done.  His last carotid study in September 2017 showed left carotid peak systolic velocity of 341 and peak diastolic velocity 937, consistent with a least 80% stenosis.  On the right peak systolic velocity was 902 and diastolic velocity was 85.  He is followed by Dr. Posey Pronto for his chronic kidney disease.  When I saw him in July 2018 he was feeling well and denied any episodes of chest pain or shortness of breath.  He denied any neurologic symptoms.  He denied paresthesias or dizziness.  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 At that time he was still against consideration for carotid surgery.  He was on aggressive lipid-lowering therapy with target LDL less than 70 and potential induce plaque regression.  He was seen by Almyra Deforest, PAC in June 2019.  He was on Eliquis without bleeding he was hospitalized from July 2 through July 6 with acute decompensated heart failure complicated by acute hypoxic respiratory failure.  An echo Doppler study showed an EF of 25 to 30% with diffuse hypokinesis which had significantly decreased from 50 to 60% in 2017.  He underwent repeat catheterization which showed occlusion of his native vessels with a patent LIMA to the LAD, a patent vein graft was on 2, and subchondral vein graft to the PDA/PLA.  He was seen by Lamar Blinks in follow-up on September 29, 2017.  At that time he was feeling well and denied significant shortness of breath and was on lisinopril 5 mg, furosemide 40 mg daily in addition to carvedilol 3.125 mg twice a day.  He is anticoagulated on Eliquis 2.5 mg twice a day.  He is on rosuvastatin 40 mg.  For restless legs he also has been on Requip.   I saw him in August 2019.  At that time he was doing well and I discussed potential transition to [facility] depending upon improvement in his renal function.  He is followed by Dr. Posey Pronto for his kidney disease.  Creatinine on December 01, 2017 was 2.46.  He denies recurrent chest pain or bleeding.  He is followed by Dr. Oneida Alar since continues to have no interest in pursuing carotid revascularization.    I last saw him in March 2020 at which time he was continuing to do well and remains very active.    He sees Dr. Posey Pronto who follows his chronic kidney disease.    I saw him in September 2020 at which time he denied any episodes of dizziness, paresthesias and continued to remain active.  In  April he cut his finger on a table saw.  He works every day in the garden and the week prior to his office visit he built a fence in his yard.  He feels that he has not had any reduction in his energy level and denies any exertional shortness of breath or chest pain.  During that evaluation his blood pressure was stable on lisinopril 5 mg, carvedilol 3.125 mg twice a day, and furosemide 40 mg.  He continues to be on rosuvastatin 40 mg for aggressive lipid management and attempt to induce plaque regression.  There was no bleeding on Eliquis.  I last saw him in March 2021 at which time he continued to be asymptomatic.  He was seeing Dr. Posey Pronto for renal insufficiency and creatinine on May 17, 2019 was 1.82.  Lipid studies revealed an LDL cholesterol of 83.  He denies chest pain PND orthopnea presyncope or syncope.   Since I last saw him, he was evaluated in October 2021 by Fabian Sharp, PA and remained  stable.  Presently, he continues to be extremely active.  He is cutting his grass, building porches index and driving a tractor.  He tells me when he last saw Dr. Posey Pronto his kidney function had improved.  He has been on Eliquis at reduced dose due to his age of greater than 30 and creatinine greater than 1.5.  Presently continues to be on Eliquis 2.5 mg twice a day, lisinopril 5 mg, furosemide 20 mg daily in addition to carvedilol 3.125 mg twice a day.  He denies any paresthesias or weakness.  He continues to be on Zetia 10 mg and rosuvastatin 40 mg for aggressive lipid management.  LDL cholesterol in March 2022 was excellent at 33.  Total cholesterol 96.  At that time creatinine was 1.37.  He presents for follow-up evaluation.    Past Medical History:  Diagnosis Date   Acute respiratory failure ([place])    Appendicitis with abscess 03/31/2011   Arthritis    CAD (coronary artery disease),CABG 1993-LIMA to the LAD, SVG to Om and SVG to PDA/PLA, patent on cath 2008. 2003   a. s/p CABG 2003. b. last cath 2008 with patent grafts.   Chronic atrial fibrillation (HCC)    permanent   Chronic systolic CHF (congestive heart failure) ([place])    a. Prior low EF, later normalized.   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 07/2015 - after holding Xarelto x 3 days for spinal procedure.   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 felt vasovagal in setting of pain from renal infarct.    Past Surgical History:  Procedure Laterality Date   BALLOON DILATION N/A 05/22/2015   Procedure: BALLOON DILATION;  Surgeon: Gatha Mayer, MD;  Location: [facility]  ENDOSCOPY;  Service: Endoscopy;  Laterality: N/A;   BALLOON DILATION N/A 12/15/2018   Procedure: BALLOON DILATION;  Surgeon: Gatha Mayer, MD;  Location: [facility] ENDOSCOPY;  Service: Endoscopy;  Laterality: N/A;   CARDIAC CATHETERIZATION  02/24/2002   reduced LV function, 60-70% prox RCA  stenosis, 70% PLA ostial stenosis, 80% secondary branch of PLA stenosis - subsequent CABG (Dr. Jackie Plum)   Carotid Doppler  06/2012   50-69% right bulb/prox ICA diameter reduction; 70-99% left bulb/prox ICA diameter reduction   CATARACT EXTRACTION W/ INTRAOCULAR LENS IMPLANT Bilateral 2013   COLONOSCOPY N/A 05/18/2016   Procedure: COLONOSCOPY;  Surgeon: Manus Gunning, MD;  Location: [facility] ENDOSCOPY;  Service: Gastroenterology;  Laterality: N/A;   CORONARY ANGIOPLASTY  07/05/2006   3 vessel CAD, patent LIMA to LAD, patentVG to OM, patent SVG to PDA & PLA, mild MR, severe LV systolic dysfunction (Dr. Gerrie Nordmann)   CORONARY ARTERY BYPASS GRAFT  03/01/2002   LIMA to LAD, reverse SVG to OM, reverse SVG to PDA of RCA, reverse SVG to PLA of RCA, ligation of LA appendage (Dr. Servando Snare)   ENTEROSCOPY N/A 05/20/2016   Procedure: ENTEROSCOPY;  Surgeon: Manus Gunning, MD;  Location: [place];  Service: Gastroenterology;  Laterality: N/A;   ESOPHAGOGASTRODUODENOSCOPY  02/01/2012   Procedure: ESOPHAGOGASTRODUODENOSCOPY (EGD);  Surgeon: Beryle Beams, MD;  Location: [facility] ENDOSCOPY;  Service: Endoscopy;  Laterality: N/A;   ESOPHAGOGASTRODUODENOSCOPY N/A 05/22/2015   Procedure: ESOPHAGOGASTRODUODENOSCOPY (EGD);  Surgeon: Gatha Mayer, MD;  Location: [facility] ENDOSCOPY;  Service: Endoscopy;  Laterality: N/A;   ESOPHAGOGASTRODUODENOSCOPY N/A 05/17/2016   Procedure: ESOPHAGOGASTRODUODENOSCOPY (EGD);  Surgeon: Juanita Craver, MD;  Location: [facility] ENDOSCOPY;  Service: Endoscopy;  Laterality: N/A;   ESOPHAGOGASTRODUODENOSCOPY N/A 09/17/2017   Procedure: ESOPHAGOGASTRODUODENOSCOPY (EGD);  Surgeon: Milus Banister, MD;  Location: [facility]  ENDOSCOPY;  Service: Endoscopy;  Laterality: N/A;   ESOPHAGOGASTRODUODENOSCOPY Left 02/09/2020   Procedure: ESOPHAGOGASTRODUODENOSCOPY (EGD);  Surgeon: Lavena Bullion, DO;  Location: [facility] ENDOSCOPY;  Service: Gastroenterology;  Laterality: Left;   ESOPHAGOGASTRODUODENOSCOPY (EGD) WITH PROPOFOL N/A 12/15/2018   Procedure: ESOPHAGOGASTRODUODENOSCOPY (EGD) WITH PROPOFOL;  Surgeon: Gatha Mayer, MD;  Location: [facility] ENDOSCOPY;  Service: Endoscopy;  Laterality: N/A;   FOREIGN BODY REMOVAL  09/17/2017   Procedure: FOREIGN BODY REMOVAL;  Surgeon: Milus Banister, MD;  Location: [facility] ENDOSCOPY;  Service: Endoscopy;;   FOREIGN BODY REMOVAL  12/15/2018   Procedure: FOREIGN BODY REMOVAL;  Surgeon: Gatha Mayer, MD;  Location: [facility] ENDOSCOPY;  Service: Endoscopy;;   FOREIGN BODY REMOVAL  02/09/2020   Procedure: FOREIGN BODY REMOVAL;  Surgeon: Lavena Bullion, DO;  Location: [place];  Service: Gastroenterology;;   GIVENS CAPSULE STUDY N/A 05/18/2016   Procedure: GIVENS CAPSULE STUDY;  Surgeon: Manus Gunning, MD;  Location: [place];  Service: Gastroenterology;  Laterality: N/A;   LAPAROSCOPIC APPENDECTOMY  03/30/2011   Procedure: APPENDECTOMY LAPAROSCOPIC;  Surgeon: Joyice Faster. Cornett, MD;  Location: [place];  Service: General;  Laterality: N/A;   LEFT HEART CATHETERIZATION WITH CORONARY ANGIOGRAM N/A 01/24/2013   Procedure: LEFT HEART CATHETERIZATION WITH CORONARY ANGIOGRAM;  Surgeon: [name], MD;  Location: [facility] CATH LAB;  Service: Cardiovascular;  Laterality: N/A;  Right heart with grafts   LITHOTRIPSY     "once" (01/21/2013)   [place]  05/2009   persantine myoview - fixed moderate perfusion defect in inferior wall & lateral segment of apex (poor non-transmural infarction), minimal anterolateral periinfarct reversible ischemia seen, abnormal study, defects similar to 2006 study   RIGHT/LEFT HEART CATH AND CORONARY ANGIOGRAPHY N/A 09/17/2017   Procedure: RIGHT/LEFT HEART  CATH AND CORONARY ANGIOGRAPHY;  Surgeon: Martinique, Peter M, MD;  Location: [facility] CV LAB;  Service: Cardiovascular;  Laterality: N/A;   SPLENECTOMY, TOTAL  01/2013   TEE WITHOUT  CARDIOVERSION N/A 01/23/2013   Procedure: TRANSESOPHAGEAL ECHOCARDIOGRAM (TEE);  Surgeon: Sanda Klein, MD;  Location: [facility] ENDOSCOPY;  Service: Cardiovascular;  Laterality: N/A;   TRANSTHORACIC ECHOCARDIOGRAM  06/23/2012   EF 40-45%, mild LVH; mild AV regurg; mild MV regurg; LV mod-severely dilated; RV mildly dilated; systolic pressure borderline increased; RA mod dilated    Allergies  Allergen Reactions   Percocet [Oxycodone-Acetaminophen] Itching and Nausea Only    Current Outpatient Medications  Medication Sig Dispense Refill   apixaban (ELIQUIS) 2.5 MG TABS tablet TAKE 1 TABLET(2.5 MG) BY MOUTH TWICE DAILY 180 tablet 1   carvedilol (COREG) 3.125 MG tablet Take 1 tablet (3.125 mg total) by mouth 2 (two) times daily with a meal. TAKE 1 TABLET(3.125 MG) BY MOUTH TWICE DAILY WITH A MEAL 180 tablet 3   ezetimibe (ZETIA) 10 MG tablet TAKE 1 TABLET(10 MG) BY MOUTH DAILY 90 tablet 0   furosemide (LASIX) 40 MG tablet Take 20 mg by mouth.     levothyroxine (SYNTHROID) 25 MCG tablet Take 1 tablet (25 mcg total) by mouth daily before breakfast. 90 tablet 1   lisinopril (ZESTRIL) 5 MG tablet Take 1 tablet (5 mg total) by mouth daily. 90 tablet 3   pantoprazole (PROTONIX) 40 MG tablet Take 1 tablet (40 mg total) by mouth 2 (two) times daily. 180 tablet 2   rOPINIRole (REQUIP) 0.25 MG tablet TAKE 1 TABLET(0.25 MG) BY MOUTH TWICE DAILY 180 tablet 1   rosuvastatin (CRESTOR) 40 MG tablet TAKE 1 TABLET(40 MG) BY MOUTH DAILY 90 tablet 1   tiZANidine (ZANAFLEX) 4 MG tablet Take 1 tablet (4 mg total) by mouth every 8 (eight) hours as needed for muscle spasms. Do not combine with tramadol 60 tablet 1   traMADol (ULTRAM) 50 MG tablet TAKE ONE-HALF TABLET BY MOUTH EVERY 8 HOURS AS NEEDED FOR PAIN 30 tablet 1   No current  facility-administered medications for this visit.    Social History   Socioeconomic History   Marital status: Married    Spouse name: Not on file   Number of children: 9   Years of education: 8   Highest education level: Not on file  Occupational History   Occupation: retired  Tobacco Use   Smoking status: Former    Packs/day: 2.00    Years: 20.00    Pack years: 40.00    Types: Cigarettes    Quit date: 04/08/1986    Years since quitting: 34.4   Smokeless tobacco: Current    Types: Chew  Substance and Sexual Activity   Alcohol use: No    Alcohol/week: 0.0 standard drinks   Drug use: No   Sexual activity: Not on file  Other Topics Concern   Not on file  Social History Narrative    married, 5 sons 4 daughters. He is retired Horticulturist, commercial. 2-3 caffeinated beverages a day no alcohol   Social Determinants of Radio broadcast assistant Strain: Not on file  Food Insecurity: Not on file  Transportation Needs: Not on file  Physical Activity: Not on file  Stress: Not on file  Social Connections: Not on file  Intimate Partner Violence: Not on file    Family History  Problem Relation Age of Onset   Heart disease Father        rheumatic fever   Heart attack Brother 57   Stroke Mother    Stroke Brother 53   Stomach cancer Neg Hx    Rectal cancer Neg Hx    Prostate cancer  Neg Hx    Pancreatic cancer Neg Hx    Ovarian cancer Neg Hx    ROS General: Negative; No fevers, chills, or night sweats;  HEENT: Negative; No changes in vision or hearing, sinus congestion, difficulty swallowing Pulmonary: Negative; No cough, wheezing, shortness of breath, hemoptysis Cardiovascular: See history of present illness GI: positive for GERD; No nausea, vomiting, diarrhea, or abdominal pain GU: Renal infarct Musculoskeletal: Negative; no myalgias, joint pain, or weakness Hematologic/Oncology: Negative; no easy bruising, bleeding Endocrine: Negative; no heat/cold intolerance; no  diabetes Neuro: positive for remote CVA Skin: Negative; No rashes or skin lesions Psychiatric: Negative; No behavioral problems, depression Sleep: Positive for restless legs; no snoring, daytime sleepiness, hypersomnolence, bruxism, , hypnogognic hallucinations, no cataplexy Other comprehensive 14 point system review is negative.   PE BP (!) 130/56 (BP Location: Right Arm)   Pulse 70   Ht 5' 6.5" (1.689 m)   Wt 180 lb 12.8 oz (82 kg)   BMI 28.74 kg/m    Repeat blood pressure by me was 118/66  Wt Readings from Last 3 Encounters:  08/28/20 180 lb 12.8 oz (82 kg)  05/22/20 193 lb (87.5 kg)  02/28/20 190 lb (86.2 kg)    General: Alert, oriented, no distress.  Bearded Skin: normal turgor, no rashes, warm and dry HEENT: Normocephalic, atraumatic. Pupils equal round and reactive to light; sclera anicteric; extraocular muscles intact;  Nose without nasal septal hypertrophy Mouth/Parynx benign; Mallinpatti scale 3 Neck: No JVD, no carotid bruits; normal carotid upstroke Lungs: clear to ausculatation and percussion; no wheezing or rales Chest wall: without tenderness to palpitation Heart: PMI not displaced, irregularly irregular with controlled ventricular rate in the 70s,  s1 s2 normal, 1/6 systolic murmur, no diastolic murmur, no rubs, gallops, thrills, or heaves Abdomen: soft, nontender; no hepatosplenomehaly, BS+; abdominal aorta nontender and not dilated by palpation. Back: no CVA tenderness Pulses 2+ Musculoskeletal: full range of motion, normal strength, no joint deformities Extremities: no clubbing cyanosis or edema, Homan's sign negative  Neurologic: grossly nonfocal; Cranial nerves grossly wnl Psychologic: Normal mood and affect    ECG (independently read by me):  Atrial fibrillation at 70; LAD, Inferior Q waves, QS V1-20 May 2019 ECG (independently read by me): Atrial fibrillation at 72 bpm, isolated PVC.  QRS voltage.  Inferior Q waves.  QS complex V1 through  V6.  September 2020 ECG (independently read by me): Atrial fibrillation with ventricular rate at 50 bpm.  QS complex V1 through V4  March 2020 ECG (independently read by me): Atrial fibrillation at 66 bpm.  Right axis deviation.  Poor anterior R wave progression V1 through V5.  DC interval for 36 ms.  January 13, 2018 ECG (independently read by me): Atrial fibrillation at 65 bpm.  Inferior Q waves.  Poor anterior R wave progression V1 through V4  August 2019 ECG (independently read by me): AF vs junctional at 61; old anterolateral MI; QTc 497 msec  July 2018 ECG (independently read by me): Atrial fibrillation at 60 bpm.  Low voltage.  Probable old anterolateral MI with poor anterior R-wave progression.  Small inferior Q waves.  December 2017 ECG (independently read by me): Atrial fibrillation with a rate at 58 bpm.  QS complex anteriorly.  Low voltage.  September 2017 ECG (independently read by me): Atrial fibrillation with premature ventricular beats.  Poor R wave progression anteriorly.  Small nondiagnostic inferior Q waves.  December 2016 ECG (independently read by me): atrial fibrillation with rate in the 60s with  occasional PVC.  QTc interval 452 ms  August 2016 ECG (independently read by me): Atrial fibrillation at 67 bpm.  2 ventricular premature beats.  Poor R-wave progression  May 2016 ECG (independently read by me): Atrial fibrillation with occasional PVCs.  Heart rate 72.  ECG (independently read by me): Atrial fibrillation with controlled ventricular response in the 70s.  Poor anterior R-wave progression.  Prior April 2015 ECG (independently read by me): Atrial fibrillation at 56 beats per minute.  QTc interval 395 ms  Prior 02/23/2013 ECG: Atrial fibrillation at 80 beats per minute with isolated PVC.  LABS: BMP Latest Ref Rng & Units 08/28/2020 05/22/2020 11/22/2019  Glucose 65 - 99 mg/dL 91 113(H) 103(H)  BUN 8 - 27 mg/dL $Remove'24 17 21  'NdncYOZ$ Creatinine 0.76 - 1.27 mg/dL 1.54(H) 1.37  1.52(H)  BUN/Creat Ratio 10 - 24 16 - 14  Sodium 134 - 144 mmol/L 143 142 139  Potassium 3.5 - 5.2 mmol/L 5.3(H) 3.9 5.3  Chloride 96 - 106 mmol/L 103 107 103  CO2 20 - 29 mmol/L $RemoveB'23 27 30  'VBmvdJpj$ Calcium 8.6 - 10.2 mg/dL 9.2 8.7 9.1   Hepatic Function Latest Ref Rng & Units 08/28/2020 05/22/2020 05/17/2019  Total Protein 6.0 - 8.5 g/dL 6.9 6.5 7.1  Albumin 3.6 - 4.6 g/dL 4.3 3.6 3.7  AST 0 - 40 IU/L $Remov'30 17 18  'BIuIVp$ ALT 0 - 44 IU/L $Remov'23 10 11  'vcEIGO$ Alk Phosphatase 44 - 121 IU/L 98 79 85  Total Bilirubin 0.0 - 1.2 mg/dL 0.4 0.5 0.4  Bilirubin, Direct 0.0 - 0.3 mg/dL - - -   CBC Latest Ref Rng & Units 08/28/2020 05/22/2020 11/22/2019  WBC 3.4 - 10.8 x10E3/uL 7.1 5.5 6.6  Hemoglobin 13.0 - 17.7 g/dL 12.9(L) 11.8(L) 12.5(L)  Hematocrit 37.5 - 51.0 % 38.9 34.6(L) 35.8(L)  Platelets 150 - 450 x10E3/uL 181 195.0 211   Lab Results  Component Value Date   MCV 94 08/28/2020   MCV 94.2 05/22/2020   MCV 94.0 11/22/2019   Lab Results  Component Value Date   TSH 3.00 05/22/2020   Lab Results  Component Value Date   HGBA1C 6.2 05/22/2020   Lipid Panel     Component Value Date/Time   CHOL 96 05/22/2020 1327   CHOL 136 05/18/2018 1355   TRIG 130.0 05/22/2020 1327   HDL 37.10 (L) 05/22/2020 1327   HDL 56 05/18/2018 1355   CHOLHDL 3 05/22/2020 1327   VLDL 26.0 05/22/2020 1327   LDLCALC 33 05/22/2020 1327   LDLCALC 67 11/22/2019 1320     IMPRESSION:  1. Coronary artery disease involving coronary bypass graft of native heart without angina pectoris   2. Hx of CABG   3. Permanent atrial fibrillation ([place])   4. Bilateral carotid artery stenosis   5. Chronic anticoagulation   6. Hyperlipidemia with target LDL less than 70   7. Renal insufficiency     ASSESSMENT AND PLAN: Mr. Stanislav Gervase is an 87 year old gentleman who is status post CABG revascularization surgery in 2003. He has a history of an  ischemic cardiomyopathy with an initial ejection fraction of 25% with subsequent normalization at 55-60%;  however, on his echo Doppler study during his July 2019 hospitalization EF was again 25 to 30%.  There was mild AR, mild to moderate MR, biatrial enlargement and mild TR.  He had akinesis of the distal anteroseptal wall and apex.  He has a remote history of a small right brain CVA in 2005  with subsequent recovery.  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  In the past he was documented to have prior thrombus in his left atrium and had been on Xarelto.  When I last saw him he was on a reduced dose of Eliquis in light of his age greater than 69 and renal dysfunction.  He has documented high-grade carotid disease and has been followed by Dr. Oneida Alar but had no interest in pursuing surgery or stenting.  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   He drives a tractor without difficulty.  Blood pressure today is controlled on his medical regimen consisting of carvedilol 3.125 mg twice a day and lisinopril 5 mg as well as Lasix 20 mg daily.  There is no edema.  There is no JVD.  He did not have hepatojugular reflux.  I am recommending we repeat laboratory to reassess his renal function.  If his creatinine clearance continues to be in the normal range and less than 1.5 he will need to titrate Eliquis to the standard dose of 5 mg twice a day.  He will be having a follow-up appointment with Dr. Posey Pronto his nephrologist.  I will contact him regarding his laboratory results.  I will see him in 6 months for reevaluation or sooner as necessary    Troy Sine, MD, [facility]  08/30/2020 3:39 PM

## 2020-08-28 NOTE — Patient Instructions (Addendum)
Medication Instructions:  Your physician recommends that you continue on your current medications as directed. Please refer to the Current Medication list given to you today.  *If you need a refill on your cardiac medications before your next appointment, please call your pharmacy*   Lab Work: CBC, Cmet today. If you have labs (blood work) drawn today and your tests are completely normal, you will receive your results only by: [place] (if you have MyChart) OR A paper copy in the mail If you have any lab test that is abnormal or we need to change your treatment, we will call you to review the results.   Testing/Procedures: None ordered.    Follow-Up: At [facility], you and your health needs are our priority.  As part of our continuing mission to provide you with exceptional heart care, we have created designated Provider Care Teams.  These Care Teams include your primary Cardiologist (physician) and Advanced Practice Providers (APPs -  Physician Assistants and Nurse Practitioners) who all work together to provide you with the care you need, when you need it.  We recommend signing up for the patient portal called "MyChart".  Sign up information is provided on this After Visit Summary.  MyChart is used to connect with patients for Virtual Visits (Telemedicine).  Patients are able to view lab/test results, encounter notes, upcoming appointments, etc.  Non-urgent messages can be sent to your provider as well.   To learn more about what you can do with MyChart, go to [email or web address].    Your next appointment:   6 month(s)  The format for your next appointment:   In Person  Provider:   Shelva Majestic, MD

## 2020-08-29 LAB — COMPREHENSIVE METABOLIC PANEL
ALT: 23 IU/L (ref 0–44)
AST: 30 IU/L (ref 0–40)
Albumin/Globulin Ratio: 1.7 (ref 1.2–2.2)
Albumin: 4.3 g/dL (ref 3.6–4.6)
Alkaline Phosphatase: 98 IU/L (ref 44–121)
BUN/Creatinine Ratio: 16 (ref 10–24)
BUN: 24 mg/dL (ref 8–27)
Bilirubin Total: 0.4 mg/dL (ref 0.0–1.2)
CO2: 23 mmol/L (ref 20–29)
Calcium: 9.2 mg/dL (ref 8.6–10.2)
Chloride: 103 mmol/L (ref 96–106)
Creatinine, Ser: 1.54 mg/dL — ABNORMAL HIGH (ref 0.76–1.27)
Globulin, Total: 2.6 g/dL (ref 1.5–4.5)
Glucose: 91 mg/dL (ref 65–99)
Potassium: 5.3 mmol/L — ABNORMAL HIGH (ref 3.5–5.2)
Sodium: 143 mmol/L (ref 134–144)
Total Protein: 6.9 g/dL (ref 6.0–8.5)
eGFR: 44 mL/min/{1.73_m2} — ABNORMAL LOW (ref >59)

## 2020-08-29 LAB — CBC
Hematocrit: 38.9 % (ref 37.5–51.0)
Hemoglobin: 12.9 g/dL — ABNORMAL LOW (ref 13.0–17.7)
MCH: 31.2 pg (ref 26.6–33.0)
MCHC: 33.2 g/dL (ref 31.5–35.7)
MCV: 94 fL (ref 79–97)
Platelets: 181 10*3/uL (ref 150–450)
RBC: 4.13 x10E6/uL — ABNORMAL LOW (ref 4.14–5.80)
RDW: 12.9 % (ref 11.6–15.4)
WBC: 7.1 10*3/uL (ref 3.4–10.8)

## 2020-08-30 ENCOUNTER — Encounter: Payer: Self-pay | Admitting: Cardiovascular Disease

## 2020-09-11 ENCOUNTER — Other Ambulatory Visit: Payer: Self-pay

## 2020-09-11 DIAGNOSIS — E875 Hyperkalemia: Secondary | ICD-10-CM

## 2020-09-18 ENCOUNTER — Other Ambulatory Visit: Payer: Self-pay | Admitting: Cardiovascular Disease

## 2020-09-20 ENCOUNTER — Telehealth: Payer: Self-pay | Admitting: Cardiovascular Disease

## 2020-09-20 DIAGNOSIS — E039 Hypothyroidism, unspecified: Secondary | ICD-10-CM

## 2020-09-20 MED ORDER — LEVOTHYROXINE SODIUM 25 MCG PO TABS: 25.0000 ug | ORAL_TABLET | Freq: Every day | ORAL | 1 refills | Status: DC

## 2020-09-20 NOTE — Telephone Encounter (Signed)
*  STAT* If patient is at the pharmacy, call can be transferred to refill team.   1. Which medications need to be refilled? (please list name of each medication and dose if known) levothyroxine (SYNTHROID) 25 MCG tablet  2. Which pharmacy/location (including street and city if local pharmacy) is medication to be sent to? [place], [place] - [address] AT [place]  3. Do they need a 30 day or 90 day supply? 90 ds

## 2020-09-20 NOTE — Telephone Encounter (Signed)
Refill sent.

## 2020-09-27 ENCOUNTER — Other Ambulatory Visit: Payer: Self-pay

## 2020-09-27 DIAGNOSIS — E875 Hyperkalemia: Secondary | ICD-10-CM | POA: Diagnosis not present

## 2020-09-27 LAB — BASIC METABOLIC PANEL
BUN/Creatinine Ratio: 16 (ref 10–24)
BUN: 33 mg/dL — ABNORMAL HIGH (ref 8–27)
CO2: 22 mmol/L (ref 20–29)
Calcium: 9.3 mg/dL (ref 8.6–10.2)
Chloride: 106 mmol/L (ref 96–106)
Creatinine, Ser: 2.08 mg/dL — ABNORMAL HIGH (ref 0.76–1.27)
Glucose: 95 mg/dL (ref 65–99)
Potassium: 5.1 mmol/L (ref 3.5–5.2)
Sodium: 141 mmol/L (ref 134–144)
eGFR: 30 mL/min/{1.73_m2} — ABNORMAL LOW (ref >59)

## 2020-10-07 ENCOUNTER — Telehealth: Payer: Self-pay | Admitting: Cardiovascular Disease

## 2020-10-07 DIAGNOSIS — I255 Ischemic cardiomyopathy: Secondary | ICD-10-CM

## 2020-10-07 MED ORDER — CARVEDILOL 3.125 MG PO TABS: 3.1250 mg | ORAL_TABLET | Freq: Two times a day (BID) | ORAL | 3 refills | Status: DC

## 2020-10-07 NOTE — Telephone Encounter (Signed)
Carvedilol refill sent 10/07/20

## 2020-10-07 NOTE — Telephone Encounter (Signed)
*  STAT* If patient is at the pharmacy, call can be transferred to refill team.   1. Which medications need to be refilled? (please list name of each medication and dose if known) carvedilol (COREG) 3.125 MG tablet  2. Which pharmacy/location (including street and city if local pharmacy) is medication to be sent to? [place], [place] - [address] AT [place]  3. Do they need a 30 day or 90 day supply? [place]

## 2020-10-10 ENCOUNTER — Other Ambulatory Visit: Payer: Self-pay | Admitting: Cardiovascular Disease

## 2020-10-10 DIAGNOSIS — I255 Ischemic cardiomyopathy: Secondary | ICD-10-CM

## 2020-11-15 ENCOUNTER — Other Ambulatory Visit: Payer: Self-pay | Admitting: Family Medicine

## 2020-11-15 DIAGNOSIS — I255 Ischemic cardiomyopathy: Secondary | ICD-10-CM

## 2020-11-26 ENCOUNTER — Other Ambulatory Visit: Payer: Self-pay | Admitting: Family Medicine

## 2020-11-26 DIAGNOSIS — G8929 Other chronic pain: Secondary | ICD-10-CM

## 2020-12-03 NOTE — Progress Notes (Addendum)
[place] at [facility] [address], [place], [place] [address] [phone]  Date:  12/05/2020   Name:  Gregory [name]   DOB:  [birth year]   MRN:  [id number]  PCP:  Darreld Mclean, MD    Chief Complaint: 6 month follow up (Concerns/ questions: none/Flu shot today: yes)   History of Present Illness:  Gregory [name] is a 87 y.o. very pleasant male patient who presents with the following:  Patient seen today for periodic follow-up Most recent visit with myself was in March  Accompanied today by his daughter [name].  He does not typically drive a car any longer, but he is still very active on their land doing numerous projects   Patient with history of CAD status post CABG 2003, A. 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 Carlean Purl did an endoscopy in December and performed an esophageal dilation- he is swallowing better now  He is continuing to swallow ok- no concerns, he avoids eating steak  Uses occasional tramadol for leg pain- he does not use that often He had a shot in his back which did help several months ago, they may repeat soon  He will get a calf cramp at times and tizantidine helps him- he also might use this at bedtime   Seen by his cardiologist, Dr. Claiborne Billings in [place] he was doing okay, continuing reduced dose of Eliquis.  Blood pressure under good control with no edema.  We continue to follow his creatinine, if it decreases will need to increase Eliquis dose  Most recent creatinine on chart from July 2.08  COVID-19 series- pt declines  Flu vaccine- give today  He continues to be quite physically active, doing activities such as pressure  washing and painting his shed   Patient Active Problem List   Diagnosis Date Noted   GERD with stricture 01/26/2019   Hypothyroid 12/12/2018   Prediabetes 12/04/2018   Esophageal obstruction due to food impaction    Chronic anemia 01/13/2016   Chronic renal failure, stage 3 (moderate) ([place]) 08/06/2015   Vasovagal syncope    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 03/31/2011   Chronic a-fib ([place]) 03/31/2011   Cardiomyopathy, ischemic, improved 03/31/2011   CVA (cerebral vascular accident) ([place]) 03/31/2011   Renal calculi 03/31/2011   History of tobacco use, continues with chewing tobacco 03/31/2011    Past Medical History:  Diagnosis Date   Acute respiratory failure ([place])    Appendicitis with abscess 03/31/2011   Arthritis    CAD (coronary artery disease),CABG 1993-LIMA to the LAD, SVG to Om and SVG to PDA/PLA, patent on cath 2008. 2003   a. s/p CABG 2003. b. last cath 2008 with patent grafts.   Chronic atrial fibrillation (HCC)    permanent   Chronic systolic CHF (congestive heart failure) ([place])    a.  Prior low EF, later normalized.   Essential hypertension    Food impaction of esophagus    GERD (gastroesophageal reflux disease)    Heart murmur    Hyperlipidemia    Ischemic cardiomyopathy    Kidney stones    "passed all but one time when he had to have lithotripsy" (01/21/2013)   Renal infarct ([place])    a. 07/2015 - after holding Xarelto x 3 days for spinal procedure.   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 felt vasovagal in setting of pain from renal infarct.    Past Surgical History:  Procedure Laterality Date   BALLOON DILATION N/A 05/22/2015   Procedure: BALLOON DILATION;  Surgeon: Gatha Mayer, MD;  Location: [facility] ENDOSCOPY;  Service: Endoscopy;  Laterality: N/A;   BALLOON DILATION N/A 12/15/2018   Procedure: BALLOON DILATION;  Surgeon: Gatha Mayer, MD;  Location: [facility] ENDOSCOPY;  Service: Endoscopy;  Laterality: N/A;   CARDIAC CATHETERIZATION  02/24/2002   reduced LV function, 60-70% prox RCA stenosis, 70% PLA ostial stenosis, 80% secondary branch of PLA stenosis - subsequent CABG (Dr. Jackie Plum)   Carotid Doppler  06/2012   50-69% right bulb/prox ICA diameter reduction; 70-99% left bulb/prox ICA diameter reduction   CATARACT EXTRACTION W/ INTRAOCULAR LENS IMPLANT Bilateral 2013   COLONOSCOPY N/A 05/18/2016   Procedure: COLONOSCOPY;  Surgeon: Manus Gunning, MD;  Location[facility] ENDOSCOPY;  Service: Gastroenterology;  Laterality: N/A;   CORONARY ANGIOPLASTY  07/05/2006   3 vessel CAD, patent LIMA to LAD, patentVG to OM, patent SVG to PDA & PLA, mild MR, severe LV systolic dysfunction (Dr. Gerrie Nordmann)   CORONARY ARTERY BYPASS GRAFT  03/01/2002   LIMA to LAD, reverse SVG to OM, reverse SVG to PDA of RCA, reverse SVG to PLA of RCA, ligation of LA appendage (Dr. Servando Snare)   ENTEROSCOPY N/A 05/20/2016   Procedure: ENTEROSCOPY;  Surgeon: Manus Gunning, MD;  Location: [place];  Service: Gastroenterology;  Laterality: N/A;   ESOPHAGOGASTRODUODENOSCOPY  02/01/2012   Procedure: ESOPHAGOGASTRODUODENOSCOPY (EGD);  Surgeon: Beryle Beams, MD;  Location: [facility] END[facility];  Service: Endoscopy;  Laterality: N/A;   ESOPHAGOGASTRODUODENOSCOPY N/A 05/22/2015   Procedure: ESOPHAGOGASTRODUODENOSCOPY (EGD);  Surgeon: Gatha Mayer, MD;  Location: [facility] ENDOSCOPY;  Service: Endoscopy;  Laterality: N/A;   ESOPHAGOGASTRODUODENOSCOPY N/A 05/17/2016    Procedure: ESOPHAGOGASTRODUODENOSCOPY (EGD);  Surgeon: Juanita Craver, MD;  Location: [facility] ENDOSCOPY;  Service: Endoscopy;  Laterality: N/A;   ESOPHAGOGASTRODUODENOSCOPY N/A 09/17/2017   Procedure: ESOPHAGOGASTRODUODENOSCOPY (EGD);  Surgeon: Milus Banister, MD;  Location: [facility] ENDOSCOPY;  Service: Endoscopy;  Laterality: N/A;   ESOPHAGOGASTRODUODENOSCOPY Left 02/09/2020   Procedure: ESOPHAGOGASTRODUODENOSCOPY (EGD);  Surgeon: Lavena Bullion, DO;  Location: [facility] ENDOSCOPY;  Service: Gastroenterology;  Laterality: Left;   ESOPHAGOGASTRODUODENOSCOPY (EGD) WITH PROPOFOL N/A 12/15/2018   Procedure: ESOPHAGOGASTRODUODENOSCOPY (EGD) WITH PROPOFOL;  Surgeon: Gatha Mayer, MD;  Location: [facility] ENDOS[facility];  Service: Endoscopy;  Laterality: N/A;   FOREIGN BODY REMOVAL  09/17/2017   Procedure: FOREIGN BODY REMOVAL;  Surgeon: Milus Banister, MD;  Location: [facility] ENDOSCOPY;  Service: Endoscopy;;   FOREIGN BODY REMOVAL  12/15/2018   Procedure: FOREIGN BODY REMOVAL;  Surgeon: Gatha Mayer, MD;  Location: [facility] ENDOSCOPY;  Service: Endoscopy;;   FOREIGN BODY REMOVAL  02/09/2020   Procedure: FOREIGN BODY REMOVAL;  Surgeon: Lavena Bullion, DO;  Location:  [facility] ENDOSCOPY;  Service: Gastroenterology;;   GIVENS CAPSULE STUDY N/A 05/18/2016   Procedure: GIVENS CAPSULE STUDY;  Surgeon: Manus Gunning, MD;  Location: [place];  Service: Gastroenterology;  Laterality: N/A;   LAPAROSCOPIC APPENDECTOMY  03/30/2011   Procedure: APPENDECTOMY LAPAROSCOPIC;  Surgeon: Joyice Faster. Cornett, MD;  Location: [place];  Service: General;  Laterality: N/A;   LEFT HEART CATHETERIZATION WITH CORONARY ANGIOGRAM N/A 01/24/2013   Procedure: LEFT HEART CATHETERIZATION WITH CORONARY ANGIOGRAM;  Surgeon: [name], MD;  Location: [facility] CATH LAB;  Service: Cardiovascular;  Laterality: N/A;  Right heart with grafts   LITHOTRIPSY     "once" (01/21/2013)   [place]  05/2009   persantine myoview - fixed moderate perfusion defect in  inferior wall & lateral segment of apex (poor non-transmural infarction), minimal anterolateral periinfarct reversible ischemia seen, abnormal study, defects similar to 2006 study   RIGHT/LEFT HEART CATH AND CORONARY ANGIOGRAPHY N/A 09/17/2017   Procedure: RIGHT/LEFT HEART CATH AND CORONARY ANGIOGRAPHY;  Surgeon: Martinique, Peter M, MD;  Location: [place] CV LAB;  Service: Cardiovascular;  Laterality: N/A;   SPLENECTOMY, TOTAL  01/2013   TEE WITHOUT CARDIOVERSION N/A 01/23/2013   Procedure: TRANSESOPHAGEAL ECHOCARDIOGRAM (TEE);  Surgeon: Sanda Klein, MD;  Location: [facility] ENDOSCOPY;  Service: Cardiovascular;  Laterality: N/A;   TRANSTHORACIC ECHOCARDIOGRAM  06/23/2012   EF 40-45%, mild LVH; mild AV regurg; mild MV regurg; LV mod-severely dilated; RV mildly dilated; systolic pressure borderline increased; RA mod dilated    Social History   Tobacco Use   Smoking status: Former    Packs/day: 2.00    Years: 20.00    Pack years: 40.00    Types: Cigarettes    Quit date: 04/08/1986    Years since quitting: 34.6   Smokeless tobacco: Current    Types: Chew  Substance Use Topics   Alcohol use: No    Alcohol/week: 0.0 standard drinks   Drug use: No    Family History  Problem Relation Age of Onset   Heart disease Father        rheumatic fever   Heart attack Brother 62   Stroke Mother    Stroke Brother 66   Stomach cancer Neg Hx    Rectal cancer Neg Hx    Prostate cancer Neg Hx    Pancreatic cancer Neg Hx    Ovarian cancer Neg Hx     Allergies  Allergen Reactions   Percocet [Oxycodone-Acetaminophen] Itching and Nausea Only    Medication list has been reviewed and updated.  Current Outpatient Medications on File Prior to Visit  Medication Sig Dispense Refill   apixaban (ELIQUIS) 2.5 MG TABS tablet TAKE 1 TABLET(2.5 MG) BY MOUTH TWICE DAILY 180 tablet 1   carvedilol (COREG) 3.125 MG tablet Take 1 tablet (3.125 mg total) by mouth 2 (two) times daily with a meal. TAKE 1 TABLET(3.125  MG) BY MOUTH TWICE DAILY WITH A MEAL 180 tablet 3   ezetimibe (ZETIA) 10 MG tablet TAKE 1 TABLET(10 MG) BY MOUTH DAILY 90 tablet 0   furosemide (LASIX) 40 MG tablet Take 20 mg by mouth.     levothyroxine (SYNTHROID) 25 MCG tablet Take 1 tablet (25 mcg total) by mouth daily before breakfast. 90 tablet 1   lisinopril (ZESTRIL) 5 MG tablet TAKE 1 TABLET(5 MG) BY MOUTH DAILY 90 tablet 3   pantoprazole (PROTONIX) 40 MG tablet Take 1 tablet (40 mg total) by mouth 2 (two) times daily. 180 tablet 2   rOPINIRole (  REQUIP) 0.25 MG tablet TAKE 1 TABLET(0.25 MG) BY MOUTH TWICE DAILY 180 tablet 1   rosuvastatin (CRESTOR) 40 MG tablet TAKE 1 TABLET(40 MG) BY MOUTH DAILY 90 tablet 1   tiZANidine (ZANAFLEX) 4 MG tablet TAKE 1 TABLET(4 MG) BY MOUTH EVERY 8 HOURS AS NEEDED FOR MUSCLE SPASMS. DO NOT COMBINE WITH TRAMADOL 60 tablet 1   traMADol (ULTRAM) 50 MG tablet TAKE 1/2 TABLET BY MOUTH EVERY 8 HOURS AS NEEDED FOR PAIN.  Time for office visit please ! 30 tablet 0   No current facility-administered medications on file prior to visit.    Review of Systems:  As per HPI- otherwise negative.   Physical Examination: Vitals:   12/05/20 1020  BP: 120/74  Pulse: 71  Resp: 18  Temp: 97.7 F (36.5 C)  SpO2: 97%   Vitals:   12/05/20 1020  Weight: 183 lb 6.4 oz (83.2 kg)  Height: 5\' 7"  (1.702 m)   Body mass index is 28.72 kg/m. Ideal Body Weight: Weight in (lb) to have BMI = 25: 159.3  GEN: no acute distress.  Looks well and his normal self  HEENT: Atraumatic, Normocephalic.  Ears and Nose: No external deformity. CV: RRR, No M/G/R. No JVD. No thrill. No extra heart sounds. PULM: CTA B, no wheezes, crackles, rhonchi. No retractions. No resp. distress. No accessory muscle use. EXTR: No c/c/e PSYCH: Normally interactive. Conversant.    Assessment and Plan: Acquired hypothyroidism - Plan: TSH  Persistent atrial fibrillation (HCC)  Anticoagulated  Chronic kidney disease, unspecified CKD stage -  Plan: Basic metabolic panel  Prediabetes - Plan: Hemoglobin A1c  Mild anemia - Plan: CBC  Need for influenza vaccination - Plan: Flu Vaccine QUAD High Dose(Fluad)  Following up as above today Continue to monitor his creat- may need to adjust Eliquis Flu vaccine given Plan for 6 month follow-up Ok to refill tramadol an muscle relaxer as needed   This visit occurred during the SARS-CoV-2 public health emergency.  Safety protocols were in place, including screening questions prior to the visit, additional usage of staff PPE, and extensive cleaning of exam room while observing appropriate contact time as indicated for disinfecting solutions.   Signed Lamar Blinks, MD  Addnd 9/23- received labs as below, letter to pt Minimal hyperkalemia not unusual for this patient.  He is not taking any potassium TSH is a bit higher but still in normal range Creatinine is still just higher than 1.5, continue reduced dose of Eliquis Results for orders placed or performed in visit on 12/05/20  TSH  Result Value Ref Range   TSH 5.45 0.35 - 5.50 uIU/mL  Basic metabolic panel  Result Value Ref Range   Sodium 141 135 - 145 mEq/L   Potassium 5.2 No hemolysis seen (H) 3.5 - 5.1 mEq/L   Chloride 108 96 - 112 mEq/L   CO2 27 19 - 32 mEq/L   Glucose, Bld 95 70 - 99 mg/dL   BUN 21 6 - 23 mg/dL   Creatinine, Ser 1.51 (H) 0.40 - 1.50 mg/dL   GFR 41.52 (L) >60.00 mL/min   Calcium 8.9 8.4 - 10.5 mg/dL  Hemoglobin A1c  Result Value Ref Range   Hgb A1c MFr Bld 6.3 4.6 - 6.5 %  CBC  Result Value Ref Range   WBC 5.6 4.0 - 10.5 K/uL   RBC 3.58 (L) 4.22 - 5.81 Mil/uL   Platelets 181.0 150.0 - 400.0 K/uL   Hemoglobin 11.4 (L) 13.0 - 17.0 g/dL   HCT 34.5 (  L) 39.0 - 52.0 %   MCV 96.2 78.0 - 100.0 fl   MCHC 33.2 30.0 - 36.0 g/dL   RDW 15.0 11.5 - 15.5 %   Patient Hemoccult positive in March 2018.  He underwent a colonoscopy also in March 2018, negative

## 2020-12-05 ENCOUNTER — Encounter: Payer: Self-pay | Admitting: Family Medicine

## 2020-12-05 ENCOUNTER — Ambulatory Visit (INDEPENDENT_AMBULATORY_CARE_PROVIDER_SITE_OTHER): Payer: Medicare Other | Admitting: Family Medicine

## 2020-12-05 ENCOUNTER — Other Ambulatory Visit: Payer: Self-pay

## 2020-12-05 VITALS — BP 120/74 | HR 71 | Temp 97.7°F | Resp 18 | Ht 67.0 in | Wt 183.4 lb

## 2020-12-05 DIAGNOSIS — I4819 Other persistent atrial fibrillation: Secondary | ICD-10-CM

## 2020-12-05 DIAGNOSIS — E039 Hypothyroidism, unspecified: Secondary | ICD-10-CM

## 2020-12-05 DIAGNOSIS — R7303 Prediabetes: Secondary | ICD-10-CM | POA: Diagnosis not present

## 2020-12-05 DIAGNOSIS — Z23 Encounter for immunization: Secondary | ICD-10-CM | POA: Diagnosis not present

## 2020-12-05 DIAGNOSIS — N189 Chronic kidney disease, unspecified: Secondary | ICD-10-CM

## 2020-12-05 DIAGNOSIS — D649 Anemia, unspecified: Secondary | ICD-10-CM

## 2020-12-05 DIAGNOSIS — Z7901 Long term (current) use of anticoagulants: Secondary | ICD-10-CM

## 2020-12-05 LAB — BASIC METABOLIC PANEL
BUN: 21 mg/dL (ref 6–23)
CO2: 27 mEq/L (ref 19–32)
Calcium: 8.9 mg/dL (ref 8.4–10.5)
Chloride: 108 mEq/L (ref 96–112)
Creatinine, Ser: 1.51 mg/dL — ABNORMAL HIGH (ref 0.40–1.50)
GFR: 41.52 mL/min — ABNORMAL LOW (ref >60.00)
Glucose, Bld: 95 mg/dL (ref 70–99)
Potassium: 5.2 mEq/L — ABNORMAL HIGH (ref 3.5–5.1)
Sodium: 141 mEq/L (ref 135–145)

## 2020-12-05 LAB — CBC
HCT: 34.5 % — ABNORMAL LOW (ref 39.0–52.0)
Hemoglobin: 11.4 g/dL — ABNORMAL LOW (ref 13.0–17.0)
MCHC: 33.2 g/dL (ref 30.0–36.0)
MCV: 96.2 fl (ref 78.0–100.0)
Platelets: 181 10*3/uL (ref 150.0–400.0)
RBC: 3.58 Mil/uL — ABNORMAL LOW (ref 4.22–5.81)
RDW: 15 % (ref 11.5–15.5)
WBC: 5.6 10*3/uL (ref 4.0–10.5)

## 2020-12-05 LAB — TSH: TSH: 5.45 u[IU]/mL (ref 0.35–5.50)

## 2020-12-05 LAB — HEMOGLOBIN A1C: Hgb A1c MFr Bld: 6.3 % (ref 4.6–6.5)

## 2020-12-05 NOTE — Patient Instructions (Signed)
Good to see you as always!  Take care and I will be in touch with your labs asap Flu shot given today Assuming all is well please see me in 6 months

## 2020-12-15 ENCOUNTER — Other Ambulatory Visit: Payer: Self-pay | Admitting: Family Medicine

## 2020-12-15 DIAGNOSIS — Z7901 Long term (current) use of anticoagulants: Secondary | ICD-10-CM

## 2020-12-23 ENCOUNTER — Other Ambulatory Visit: Payer: Self-pay | Admitting: Cardiovascular Disease

## 2020-12-27 DIAGNOSIS — M48061 Spinal stenosis, lumbar region without neurogenic claudication: Secondary | ICD-10-CM | POA: Diagnosis not present

## 2021-01-03 ENCOUNTER — Telehealth: Payer: Self-pay | Admitting: Cardiovascular Disease

## 2021-01-03 NOTE — Telephone Encounter (Signed)
Patient calling the office for samples of medication:   1.  What medication and dosage are you requesting samples for? apixaban (ELIQUIS) 2.5 MG TABS tablet  2.  Are you currently out of this medication? No  Patient is in the donut hole

## 2021-01-24 ENCOUNTER — Telehealth: Payer: Self-pay

## 2021-01-24 ENCOUNTER — Telehealth: Payer: Self-pay | Admitting: Cardiovascular Disease

## 2021-01-24 ENCOUNTER — Other Ambulatory Visit: Payer: Self-pay | Admitting: Family Medicine

## 2021-01-24 DIAGNOSIS — I255 Ischemic cardiomyopathy: Secondary | ICD-10-CM

## 2021-01-24 MED ORDER — CARVEDILOL 3.125 MG PO TABS: 3.1250 mg | ORAL_TABLET | Freq: Two times a day (BID) | ORAL | 1 refills | Status: DC

## 2021-01-24 NOTE — Telephone Encounter (Signed)
PA started: Ruta [place] Key: [id number] - PA Case ID: [id number] - Rx #: [id number]

## 2021-01-24 NOTE — Telephone Encounter (Signed)
Patient calling the office for samples of medication:   1.  What medication and dosage are you requesting samples for? apixaban (ELIQUIS) 2.5 MG TABS tablet  2.  Are you currently out of this medication? Patient is in the donut hole.

## 2021-01-24 NOTE — Telephone Encounter (Signed)
*  STAT* If patient is at the pharmacy, call can be transferred to refill team.   1. Which medications need to be refilled? (please list name of each medication and dose if known) carvedilol (COREG) 3.125 MG tablet  2. Which pharmacy/location (including street and city if local pharmacy) is medication to be sent to? [place], [place] - [address] AT [place]  3. Do they need a 30 day or 90 day supply? 90 day  Only has 1 pill left.

## 2021-01-27 ENCOUNTER — Other Ambulatory Visit: Payer: Self-pay | Admitting: Cardiovascular Disease

## 2021-01-27 NOTE — Telephone Encounter (Signed)
The patient qualifies for samples of the eliquis 2.5mg  bid. Will route back to nl refills for packaging and please include assistance form.

## 2021-01-28 NOTE — Telephone Encounter (Signed)
Called patient- advised of message below.  Patient assistance and samples placed up front.  Thanks!

## 2021-02-03 ENCOUNTER — Encounter: Payer: Self-pay | Admitting: Cardiovascular Disease

## 2021-02-03 NOTE — Telephone Encounter (Signed)
Patient daughter called stating they received a letter from his insurance stating they denied his eliquis because it states he is taking 3 tablets per day.  He only takes 2 tablets per day.  She is wondering if it was a error on our part when we submitted the request.

## 2021-02-03 NOTE — Telephone Encounter (Signed)
error 

## 2021-02-04 ENCOUNTER — Telehealth: Payer: Self-pay | Admitting: Cardiovascular Disease

## 2021-02-04 NOTE — Telephone Encounter (Signed)
Pt c/o medication issue:  1. Name of Medication: apixaban (ELIQUIS) 2.5 MG TABS tablet  2. How are you currently taking this medication (dosage and times per day)? TAKE 1 TABLET(2.5 MG) BY MOUTH TWICE DAILY  3. Are you having a reaction (difficulty breathing--STAT)? no  4. What is your medication issue? Daughter called in to say that patient insurance dying his request for Eliquis because they have him taking 3 and not 2. Please advise

## 2021-02-04 NOTE — Telephone Encounter (Signed)
Spoke to daughter-denied coverage d/t doctor reported taking 2.5 mg TID.     Advised PA was submitted via PCP office.  Advised would attempt to resubmit with correct frequency.    Daughter states he has samples currently but would like an update once this is completed.    Attempted to resubmit PA, following message received: Key: [id number] Case ID: [id number]  "We are unable to process your request for prior authorization for Eliquis tab 2.5 mg for the above member due to [facility] has a denied request on file for Eliquis tab 2.5 mg for this member.  Please refer to the appeals process outlined in the original denial or contact Prior Authorization Department at [phone] for further questions   Called daughter-made aware PCP needs to appeal.   Verbalized understanding.

## 2021-02-04 NOTE — Telephone Encounter (Signed)
Called to explain that I submitted a letter to insurance explaining that the PA was for 3 months not TID. Called to f/u- insurance is experiencing a system update, will call ing the AM. Daughter is aware. [place] will be out mid December. Will f/u with insurance in the AM.

## 2021-02-04 NOTE — Telephone Encounter (Signed)
Pt. Daughter called and stated that appeal for medication has to be done by Dr.[name]. Because the rx that was messed up was from Dr.C they are requesting that the appeal be sent in by her. The lady at the heart dr was going to file the appeal for her but she didn't have all of Dr.Coplands information. She said if you need clarification from Dr.Kelly's office she was talking to [facility]. She said he only takes 2tablets a day and not 3. They would like a call when this is done so they can know when to request the refill at the pharmacy.   [phone]

## 2021-02-05 NOTE — Telephone Encounter (Signed)
The rep says the PA remains open because the medication is due December 02/27/21.

## 2021-02-12 NOTE — Telephone Encounter (Signed)
Pt. Daughter called and stated she hasnt heard anything back regarding medication PA process. She stated a appeal was supposed to be submitted already and no one has updated her.   [phone]

## 2021-02-13 ENCOUNTER — Other Ambulatory Visit: Payer: Self-pay | Admitting: Family Medicine

## 2021-02-13 DIAGNOSIS — G2581 Restless legs syndrome: Secondary | ICD-10-CM

## 2021-02-13 NOTE — Telephone Encounter (Signed)
After being transferred over and over Insurance let me know that the only reason why the PA was sent was because the refill request was too soon and that the pt can get the Rx filled on 02/16/21. I called the pharmacy to verify and they did confirm. Pts daughter is aware.

## 2021-03-14 ENCOUNTER — Emergency Department (HOSPITAL_COMMUNITY): Payer: Medicare Other

## 2021-03-14 ENCOUNTER — Emergency Department (HOSPITAL_COMMUNITY)
Admission: EM | Admit: 2021-03-14 | Discharge: 2021-03-14 | Disposition: A | Discharge status: Home / Self Care | Payer: Medicare Other | Attending: Emergency Medicine | Admitting: Emergency Medicine

## 2021-03-14 ENCOUNTER — Encounter (HOSPITAL_COMMUNITY): Payer: Self-pay

## 2021-03-14 ENCOUNTER — Other Ambulatory Visit: Payer: Self-pay

## 2021-03-14 DIAGNOSIS — I482 Chronic atrial fibrillation, unspecified: Secondary | ICD-10-CM | POA: Diagnosis not present

## 2021-03-14 DIAGNOSIS — R509 Fever, unspecified: Secondary | ICD-10-CM | POA: Diagnosis not present

## 2021-03-14 DIAGNOSIS — Z87891 Personal history of nicotine dependence: Secondary | ICD-10-CM | POA: Insufficient documentation

## 2021-03-14 DIAGNOSIS — Z79899 Other long term (current) drug therapy: Secondary | ICD-10-CM | POA: Diagnosis not present

## 2021-03-14 DIAGNOSIS — I5022 Chronic systolic (congestive) heart failure: Secondary | ICD-10-CM | POA: Insufficient documentation

## 2021-03-14 DIAGNOSIS — M25551 Pain in right hip: Secondary | ICD-10-CM | POA: Insufficient documentation

## 2021-03-14 DIAGNOSIS — I2510 Atherosclerotic heart disease of native coronary artery without angina pectoris: Secondary | ICD-10-CM | POA: Insufficient documentation

## 2021-03-14 DIAGNOSIS — U071 COVID-19: Secondary | ICD-10-CM | POA: Insufficient documentation

## 2021-03-14 DIAGNOSIS — Z743 Need for continuous supervision: Secondary | ICD-10-CM | POA: Diagnosis not present

## 2021-03-14 DIAGNOSIS — I110 Hypertensive heart disease with heart failure: Secondary | ICD-10-CM | POA: Diagnosis not present

## 2021-03-14 DIAGNOSIS — E039 Hypothyroidism, unspecified: Secondary | ICD-10-CM | POA: Diagnosis not present

## 2021-03-14 DIAGNOSIS — Z7901 Long term (current) use of anticoagulants: Secondary | ICD-10-CM | POA: Insufficient documentation

## 2021-03-14 HISTORY — DX: COVID-19: U07.1

## 2021-03-14 LAB — CBC WITH DIFFERENTIAL/PLATELET
Abs Immature Granulocytes: 0.04 10*3/uL (ref 0.00–0.07)
Basophils Absolute: 0 10*3/uL (ref 0.0–0.1)
Basophils Relative: 0 %
Eosinophils Absolute: 0 10*3/uL (ref 0.0–0.5)
Eosinophils Relative: 0 %
HCT: 37.1 % — ABNORMAL LOW (ref 39.0–52.0)
Hemoglobin: 12.1 g/dL — ABNORMAL LOW (ref 13.0–17.0)
Immature Granulocytes: 1 %
Lymphocytes Relative: 25 %
Lymphs Abs: 1.7 10*3/uL (ref 0.7–4.0)
MCH: 31.5 pg (ref 26.0–34.0)
MCHC: 32.6 g/dL (ref 30.0–36.0)
MCV: 96.6 fL (ref 80.0–100.0)
Monocytes Absolute: 0.8 10*3/uL (ref 0.1–1.0)
Monocytes Relative: 12 %
Neutro Abs: 4.2 10*3/uL (ref 1.7–7.7)
Neutrophils Relative %: 62 %
Platelets: 192 10*3/uL (ref 150–400)
RBC: 3.84 MIL/uL — ABNORMAL LOW (ref 4.22–5.81)
RDW: 13 % (ref 11.5–15.5)
WBC: 6.7 10*3/uL (ref 4.0–10.5)
nRBC: 0 % (ref 0.0–0.2)

## 2021-03-14 LAB — URINALYSIS, ROUTINE W REFLEX MICROSCOPIC
Bacteria, UA: NONE SEEN
Bilirubin Urine: NEGATIVE
Glucose, UA: NEGATIVE mg/dL
Hgb urine dipstick: NEGATIVE
Ketones, ur: NEGATIVE mg/dL
Leukocytes,Ua: NEGATIVE
Nitrite: NEGATIVE
Protein, ur: 30 mg/dL — AB
Specific Gravity, Urine: 1.019 (ref 1.005–1.030)
pH: 6 (ref 5.0–8.0)

## 2021-03-14 LAB — BASIC METABOLIC PANEL
Anion gap: 6 (ref 5–15)
BUN: 19 mg/dL (ref 8–23)
CO2: 26 mmol/L (ref 22–32)
Calcium: 8 mg/dL — ABNORMAL LOW (ref 8.9–10.3)
Chloride: 100 mmol/L (ref 98–111)
Creatinine, Ser: 1.41 mg/dL — ABNORMAL HIGH (ref 0.61–1.24)
GFR, Estimated: 49 mL/min — ABNORMAL LOW (ref >60)
Glucose, Bld: 112 mg/dL — ABNORMAL HIGH (ref 70–99)
Potassium: 4 mmol/L (ref 3.5–5.1)
Sodium: 132 mmol/L — ABNORMAL LOW (ref 135–145)

## 2021-03-14 LAB — RESP PANEL BY RT-PCR (FLU A&B, COVID) ARPGX2
Influenza A by PCR: NEGATIVE
Influenza B by PCR: NEGATIVE
SARS Coronavirus 2 by RT PCR: POSITIVE — AB

## 2021-03-14 MED ORDER — ONDANSETRON HCL 4 MG PO TABS: 4.0000 mg | ORAL_TABLET | Freq: Four times a day (QID) | ORAL | 0 refills | Status: DC

## 2021-03-14 MED ORDER — ACETAMINOPHEN 325 MG PO TABS
650.0000 mg | ORAL_TABLET | Freq: Once | ORAL | Status: AC
  Administered 2021-03-14: 19:00:00 650 mg via ORAL
  Filled 2021-03-14: qty 2

## 2021-03-14 NOTE — ED Triage Notes (Addendum)
Per EMS- Patient c/o loss of appetite and malaise x 3 weeks. Family members have the flu.  Patient's daughter reports that the patient fell 3 weeks ago and has a large bruise to the right hip. Patient c/o pain in the right hip when he ambulates. Patient denies hitting his head or having LOC.

## 2021-03-14 NOTE — Discharge Instructions (Signed)
If you had any testing done today, the results will show up on your MyChart phone app in 24 hours.  Call your primary care doctor in the next 1-2 days to arrange video follow-up.   Use a finger pulse oximeter at home.  You may purchase one at [facility] or [facility] or online.  If the numbers drops and stays below 90%, return immediately back to the ER.  Otherwise increase your fluid intake, isolate at home for 5 days if symptoms have resolved, and inform recent close contacts of the need to test for Covid if they develop symptoms.

## 2021-03-14 NOTE — ED Provider Notes (Signed)
Emergency Medicine Provider Triage Evaluation Note  Gregory [name] , a 87 y.o. male  was evaluated in triage.  Pt complains of right hip pain.  Patient presents with daughter.  She states that the patient fell 3 weeks ago on his right hip.  She states that he did not tell anyone about this until this week.  Patient states that he was out in the yard picking up sticks when he bent over and lost his balance and fell onto a rock.  He states that he fell onto his right hip.  He denies hitting his head or loss of consciousness.  He denies shortness of breath, chest pain, lightheadedness prior to or after falling.  Initially family member reports that he has had loss of appetite and fatigue over the past few days particularly since Monday.  For members have recently had the flu.  He reports fever yesterday.  Denies abdominal pain, nausea, vomiting, diarrhea, cough.  Review of Systems  Positive: See above Negative:  Physical Exam  BP 123/60 (BP Location: Left Arm)    Pulse (!) 56    Temp 99.6 F (37.6 C) (Oral)    Resp 18    Ht 5' 6.5" (1.689 m)    Wt 81.6 kg    SpO2 99%    BMI 28.62 kg/m  Gen:   Awake, no distress   Resp:  Normal effort  MSK:   Moves extremities without difficulty  Other:  Tenderness to palpation of the right hip.  There is no overlying bruising at this time.  Medical Decision Making  Medically screening exam initiated at 12:07 PM.  Appropriate orders placed.  Gregory [name] was informed that the remainder of the evaluation will be completed by another provider, this initial triage assessment does not replace that evaluation, and the importance of remaining in the ED until their evaluation is complete.     [name], PA-C 03/14/21 1208    Valarie Merino, MD 03/14/21 [phone]

## 2021-03-14 NOTE — ED Provider Notes (Signed)
[facility] DEPT Provider Note   CSN: [id number] Arrival date & time: 03/14/21  1150     History Chief Complaint  Patient presents with   Anorexia   Fatigue   Fall   Hip Pain    FUTURE Gregory [name] is a 87 y.o. male.  Patient presents ER chief complaint of fevers, generalized malaise generalized weakness generalized body aches.  Symptoms have been ongoing for the past 2 to 3 weeks.  Family member states that multiple family was have not diagnosed positive for influenza A.  They are mostly recovered but this patient has been having persistent symptoms for about almost 3 weeks now.  He had a fall 3 weeks ago as well ground-level mechanical fall and he has been improving since then but has persistent mild tenderness to the right buttock right hip region.  Otherwise denies headache or neck pain.  Denies chest pain or abdominal pain.  Had some episodes of vomiting a few days ago but no recent vomiting no diarrhea reported.  Family is concerned that he has decreased appetite at home.  However he is tolerating oral intake and ate a corn dog a few days ago.      Past Medical History:  Diagnosis Date   Acute respiratory failure ([place])    Appendicitis with abscess 03/31/2011   Arthritis    CAD (coronary artery disease),CABG 1993-LIMA to the LAD, SVG to Om and SVG to PDA/PLA, patent on cath 2008. 2003   a. s/p CABG 2003. b. last cath 2008 with patent grafts.   Chronic atrial fibrillation (HCC)    permanent   Chronic systolic CHF (congestive heart failure) ([place])    a. Prior low EF, later normalized.   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 07/2015 - after holding Xarelto x 3 days for spinal procedure.   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 felt vasovagal in setting of pain from renal infarct.    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 03/31/2011   Chronic a-fib ([place]) 03/31/2011   Cardiomyopathy, ischemic, improved 03/31/2011   CVA (cerebral vascular accident) ([place]) 03/31/2011   Renal calculi 03/31/2011   History of tobacco use, continues with chewing tobacco 03/31/2011    Past Surgical History:  Procedure Laterality Date   BALLOON DILATION N/A  05/22/2015   Procedure: BALLOON DILATION;  Surgeon: Gatha Mayer, MD;  Location: [facility];  Service: Endoscopy;  Laterality: N/A;   BALLOON DILATION N/A 12/15/2018   Procedure: BALLOON DILATION;  Surgeon: Gatha Mayer, MD;  Location: [facility] ENDOSCOPY;  Service: Endoscopy;  Laterality: N/A;   CARDIAC CATHETERIZATION  02/24/2002   reduced LV function, 60-70% prox RCA stenosis, 70% PLA ostial stenosis, 80% secondary branch of PLA stenosis - subsequent CABG (Dr. Jackie Plum)   Carotid Doppler  06/2012   50-69% right bulb/prox ICA diameter reduction; 70-99% left bulb/prox ICA diameter reduction   CATARACT EXTRACTION W/ INTRAOCULAR LENS IMPLANT Bilateral 2013    COLONOSCOPY N/A 05/18/2016   Procedure: COLONOSCOPY;  Surgeon: Manus Gunning, MD;  Location: [facility] ENDOSCOPY;  Service: Gastroenterology;  Laterality: N/A;   CORONARY ANGIOPLASTY  07/05/2006   3 vessel CAD, patent LIMA to LAD, patentVG to OM, patent SVG to PDA & PLA, mild MR, severe LV systolic dysfunction (Dr. Gerrie Nordmann)   CORONARY ARTERY BYPASS GRAFT  03/01/2002   LIMA to LAD, reverse SVG to OM, reverse SVG to PDA of RCA, reverse SVG to PLA of RCA, ligation of LA appendage (Dr. Servando Snare)   ENTEROSCOPY N/A 05/20/2016   Procedure: ENTEROSCOPY;  Surgeon: Manus Gunning, MD;  Location: [place];  Service: Gastroenterology;  Laterality: N/A;   ESOPHAGOGASTRODUODENOSCOPY  02/01/2012   Procedure: ESOPHAGOGASTRODUODENOSCOPY (EGD);  Surgeon: Beryle Beams, MD;  Location: [facility];  Service: Endoscopy;  Laterality: N/A;   ESOPHAGOGASTRODUODENOSCOPY N/A 05/22/2015   Procedure: ESOPHAGOGASTRODUODENOSCOPY (EGD);  Surgeon: Gatha Mayer, MD;  Location: [facility];  Service: Endoscopy;  Laterality: N/A;   ESOPHAGOGASTRODUODENOSCOPY N/A 05/17/2016   Procedure: ESOPHAGOGASTRODUODENOSCOPY (EGD);  Surgeon: Juanita Craver, MD;  Location[facility] ENDOSCOPY;  Service: Endoscopy;  Laterality: N/A;   ESOPHAGOGASTRODUODENOSCOPY N/A 09/17/2017   Procedure: ESOPHAGOGASTRODUODENOSCOPY (EGD);  Surgeon: Milus Banister, MD;  Location[facility];  Service: Endoscopy;  Laterality: N/A;   ESOPHAGOGASTRODUODENOSCOPY Left 02/09/2020   Procedure: ESOPHAGOGASTRODUODENOSCOPY (EGD);  Surgeon: Lavena Bullion, DO;  Location: [facility] ENDOSCOPY;  Service: Gastroenterology;  Laterality: Left;   ESOPHAGOGASTRODUODENOSCOPY (EGD) WITH PROPOFOL N/A 12/15/2018   Procedure: ESOPHAGOGASTRODUODENOSCOPY (EGD) WITH PROPOFOL;  Surgeon: Gatha Mayer, MD;  Location: [facility] ENDOSCOPY;  Service: Endoscopy;  Laterality: N/A;   FOREIGN BODY REMOVAL  09/17/2017   Procedure: FOREIGN BODY REMOVAL;  Surgeon: Milus Banister, MD;  [facility]  ENDOSCOPY;  Service: Endoscopy;;   FOREIGN BODY REMOVAL  12/15/2018   Procedure: FOREIGN BODY REMOVAL;  Surgeon: Gatha Mayer, MD;  Location: [facility] ENDOSCOPY;  Service: Endoscopy;;   FOREIGN BODY REMOVAL  02/09/2020   Procedure: FOREIGN BODY REMOVAL;  Surgeon: Lavena Bullion, DO;  Location: [place];  Service: Gastroenterology;;   GIVENS CAPSULE STUDY N/A 05/18/2016   Procedure: GIVENS CAPSULE STUDY;  Surgeon: Manus Gunning, MD;  Location: [place];  Service: Gastroenterology;  Laterality: N/A;   LAPAROSCOPIC APPENDECTOMY  03/30/2011   Procedure: APPENDECTOMY LAPAROSCOPIC;  Surgeon: Joyice Faster. Cornett, MD;  Location: [place];  Service: General;  Laterality: N/A;   LEFT HEART CATHETERIZATION WITH CORONARY ANGIOGRAM N/A 01/24/2013   Procedure: LEFT HEART CATHETERIZATION WITH CORONARY ANGIOGRAM;  Surgeon: [name], MD;  Location: [facility] CATH LAB;  Service: Cardiovascular;  Laterality: N/A;  Right heart with grafts   LITHOTRIPSY     "once" (01/21/2013)   [place]  05/2009   persantine myoview - fixed moderate perfusion defect in inferior wall & lateral segment of apex (  poor non-transmural infarction), minimal anterolateral periinfarct reversible ischemia seen, abnormal study, defects similar to 2006 study   RIGHT/LEFT HEART CATH AND CORONARY ANGIOGRAPHY N/A 09/17/2017   Procedure: RIGHT/LEFT HEART CATH AND CORONARY ANGIOGRAPHY;  Surgeon: Martinique, Peter M, MD;  Location: [facility] CV LAB;  Service: Cardiovascular;  Laterality: N/A;   SPLENECTOMY, TOTAL  01/2013   TEE WITHOUT CARDIOVERSION N/A 01/23/2013   Procedure: TRANSESOPHAGEAL ECHOCARDIOGRAM (TEE);  Surgeon: Sanda Klein, MD;  Location: [facility] ENDOSCOPY;  Service: Cardiovascular;  Laterality: N/A;   TRANSTHORACIC ECHOCARDIOGRAM  06/23/2012   EF 40-45%, mild LVH; mild AV regurg; mild MV regurg; LV mod-severely dilated; RV mildly dilated; systolic pressure borderline increased; RA mod dilated       Family History   Problem Relation Age of Onset   Heart disease Father        rheumatic fever   Heart attack Brother 70   Stroke Mother    Stroke Brother 23   Stomach cancer Neg Hx    Rectal cancer Neg Hx    Prostate cancer Neg Hx    Pancreatic cancer Neg Hx    Ovarian cancer Neg Hx     Social History   Tobacco Use   Smoking status: Former    Packs/day: 2.00    Years: 20.00    Pack years: 40.00    Types: Cigarettes    Quit date: 04/08/1986    Years since quitting: 34.9   Smokeless tobacco: Current    Types: Chew  Vaping Use   Vaping Use: Never used  Substance Use Topics   Alcohol use: No    Alcohol/week: 0.0 standard drinks   Drug use: No    Home Medications Prior to Admission medications   Medication Sig Start Date End Date Taking? Authorizing Provider  ondansetron (ZOFRAN) 4 MG tablet Take 1 tablet (4 mg total) by mouth every 6 (six) hours. 03/14/21  Yes Tiffanee Mcnee, Greggory Brandy, MD  apixaban (ELIQUIS) 2.5 MG TABS tablet TAKE 1 TABLET(2.5 MG) BY MOUTH TWICE DAILY 12/16/20   [name], [name], MD  carvedilol (COREG) 3.125 MG tablet Take 1 tablet (3.125 mg total) by mouth 2 (two) times daily with a meal. TAKE 1 TABLET(3.125 MG) BY MOUTH TWICE DAILY WITH A MEAL 01/24/21   Troy Sine, MD  ezetimibe (ZETIA) 10 MG tablet TAKE 1 TABLET(10 MG) BY MOUTH DAILY 12/25/20   Troy Sine, MD  furosemide (LASIX) 40 MG tablet Take 20 mg by mouth.    [provider]  levothyroxine (SYNTHROID) 25 MCG tablet Take 1 tablet (25 mcg total) by mouth daily before breakfast. 09/20/20   Troy Sine, MD  lisinopril (ZESTRIL) 5 MG tablet TAKE 1 TABLET(5 MG) BY MOUTH DAILY 11/15/20   Mosie Lukes, MD  pantoprazole (PROTONIX) 40 MG tablet TAKE 1 TABLET(40 MG) BY MOUTH TWICE DAILY 01/28/21   Troy Sine, MD  rOPINIRole (REQUIP) 0.25 MG tablet TAKE 1 TABLET(0.25 MG) BY MOUTH TWICE DAILY 02/13/21   [name], [name], MD  rosuvastatin (CRESTOR) 40 MG tablet TAKE 1 TABLET(40 MG) BY MOUTH DAILY 08/19/20    Troy Sine, MD  tiZANidine (ZANAFLEX) 4 MG tablet TAKE 1 TABLET(4 MG) BY MOUTH EVERY 8 HOURS AS NEEDED FOR MUSCLE SPASMS. DO NOT COMBINE WITH TRAMADOL 11/27/20   [name], [name], MD  traMADol (ULTRAM) 50 MG tablet TAKE 1/2 TABLET BY MOUTH EVERY 8 HOURS AS NEEDED FOR PAIN.  Time for office visit please ! 11/27/20   [name], [name], MD  Allergies    Percocet [oxycodone-acetaminophen]  Review of Systems   Review of Systems  Constitutional:  Negative for fever.  HENT:  Negative for ear pain and sore throat.   Eyes:  Negative for pain.  Respiratory:  Negative for cough.   Cardiovascular:  Negative for chest pain.  Gastrointestinal:  Negative for abdominal pain.  Genitourinary:  Negative for flank pain.  Musculoskeletal:  Negative for back pain.  Skin:  Negative for color change and rash.  Neurological:  Negative for syncope.  All other systems reviewed and are negative.  Physical Exam Updated Vital Signs BP (!) 135/53 (BP Location: Right Arm)    Pulse 75    Temp (!) 102.2 F (39 C) (Oral)    Resp 18    Ht 5' 6.5" (1.689 m)    Wt 81.6 kg    SpO2 99%    BMI 28.62 kg/m   Physical Exam Constitutional:      Appearance: He is well-developed.  HENT:     Head: Normocephalic.     Nose: Nose normal.  Eyes:     Extraocular Movements: Extraocular movements intact.  Cardiovascular:     Rate and Rhythm: Normal rate.  Pulmonary:     Effort: Pulmonary effort is normal.  Abdominal:     Tenderness: There is no abdominal tenderness. There is no guarding or rebound.  Skin:    Coloration: Skin is not jaundiced.     Comments: No discoloration bruising or ecchymosis noted on the right hip or right flank region.  Neurological:     Mental Status: He is alert. Mental status is at baseline.    ED Results / Procedures / Treatments   Labs (all labs ordered are listed, but only abnormal results are displayed) Labs Reviewed  RESP PANEL BY RT-PCR (FLU A&B, COVID) ARPGX2 - Abnormal; Notable  for the following components:      Result Value   SARS Coronavirus 2 by RT PCR POSITIVE (*)    All other components within normal limits  BASIC METABOLIC PANEL - Abnormal; Notable for the following components:   Sodium 132 (*)    Glucose, Bld 112 (*)    Creatinine, Ser 1.41 (*)    Calcium 8.0 (*)    GFR, Estimated 49 (*)    All other components within normal limits  CBC WITH DIFFERENTIAL/PLATELET - Abnormal; Notable for the following components:   RBC 3.84 (*)    Hemoglobin 12.1 (*)    HCT 37.1 (*)    All other components within normal limits  URINALYSIS, ROUTINE W REFLEX MICROSCOPIC - Abnormal; Notable for the following components:   Protein, ur 30 (*)    All other components within normal limits  CBG MONITORING, ED    EKG None  Radiology DG Hip Unilat With Pelvis 2-3 Views Right  Result Date: 03/14/2021 CLINICAL DATA:  Right hip pain after fall 3 weeks ago. EXAM: DG HIP (WITH OR WITHOUT PELVIS) 2-3V RIGHT COMPARISON:  None. FINDINGS: There is no evidence of hip fracture or dislocation. There is no evidence of arthropathy or other focal bone abnormality. IMPRESSION: Negative. Electronically Signed   By: Marijo Conception M.D.   On: 03/14/2021 12:44    Procedures Procedures   Medications Ordered in ED Medications  acetaminophen (TYLENOL) tablet 650 mg (650 mg Oral Given 03/14/21 1834)    ED Course  I have reviewed the triage vital signs and the nursing notes.  Pertinent labs & imaging results that were available during my  care of the patient were reviewed by me and considered in my medical decision making (see chart for details).    MDM Rules/Calculators/A&P                         Patient's labs look unremarkable white count normal chemistry normal CBC normal creatinine near his baseline levels.  Patient is otherwise positive for COVID virus negative for influenza.  Given Tylenol here for his fevers.  Recommend continued home rest fluid intake and that you follow-up  with your doctor within 3 to 4 days.  Advising immediate return for worsening symptoms difficulty breathing or any additional concerns.     Final Clinical Impression(s) / ED Diagnoses Final diagnoses:  COVID-19 virus infection    Rx / DC Orders ED Discharge Orders          Ordered    ondansetron (ZOFRAN) 4 MG tablet  Every 6 hours        03/14/21 1836             Luna Fuse, MD 03/14/21 1836

## 2021-03-18 ENCOUNTER — Telehealth: Payer: Self-pay | Admitting: Internal Medicine

## 2021-03-18 ENCOUNTER — Telehealth: Payer: Self-pay

## 2021-03-18 NOTE — Telephone Encounter (Signed)
Inbound call from daughter, stated that patient went to [facility] ER on Friday due to not being able to swallow solid foods. Daughter states they seemed not too concerned, because he is able to swallow soft foods. Stated that he does not have a sore throat, can eat any type of soft food. Solid foods seem to be getting trapped. Daughter also advised that this has happened before, and food had been collecting in the throat for days. Seeking advice as to what they should do going forward. Please advise.

## 2021-03-18 NOTE — Telephone Encounter (Signed)
°  Nurse Assessment Nurse: Malena Peer, RN, Edwena Felty Date/Time (Eastern Time): 03/17/2021 10:04:16 AM Confirm and document reason for call. If symptomatic, describe symptoms. ---Caller states father tested positive for covid in the ED on 03/14/21. His current symptoms are fatigue and he is unable to eat. He is drinking some but caller states everything he takes orally, but he spits out some of it. He was able to get an ensure and some gatorade down. Every time he eats, he gets very nauseated and has vomits on solid foods; he has not had solid food for 2 weeks. He is taking nausea medication ondansetron 4mg . His most recent b/p was 93/50; earlier it was 98/45 pulse 84 O2. He is extremely weak and is now requiring a walker with assistance to get up. Denies any fever since 03/14/21. Does the patient have any new or worsening symptoms? ---Yes Will a triage be completed? ---Yes Related visit to physician within the last 2 weeks? ---Yes Does the PT have any chronic conditions? (i.e. diabetes, asthma, this includes High risk factors for pregnancy, etc.) ---Yes List chronic conditions. ---Stage IV renal failure, heart conditions Is this a behavioral health or substance abuse call? ---No PLEASE NOTE: All timestamps contained within this report are represented as [place] Standard Time. CONFIDENTIALTY NOTICE: This fax transmission is intended only for the addressee. It contains information that is legally privileged, confidential or otherwise protected from use or disclosure. If you are not the intended recipient, you are strictly prohibited from reviewing, disclosing, copying using or disseminating any of this information or taking any action in reliance on or regarding this information. If you have received this fax in error, please notify us immediately by telephone so that we can arrange for its return to [place]. Phone: [phone], Toll-Free: [phone], Fax: [phone] Page: 2 of 2 Call Id:  [id number] Guidelines Guideline Title Affirmed Question Affirmed Notes Nurse Date/Time Eilene Ghazi Time) COVID-19 - Diagnosed or Suspected Shock suspected (e.g., cold/pale/ clammy skin, too weak to stand, low BP, rapid pulse) Strick, RN, Edwena Felty 03/17/2021 10:11:53 AM Disp. Time Eilene Ghazi Time) Disposition Final User 03/17/2021 10:02:32 AM Send to Urgent Queue Fran Lowes 03/17/2021 10:24:03 AM 911 Outcome Documentation Strick, RN, Edwena Felty Reason: Per caller, EMS is en route to residence 03/17/2021 10:17:00 AM Call EMS 911 Now Yes Strick, RN, Osie Cheeks Disagree/Comply Comply Caller Understands Yes PreDisposition [place] Advice Given Per Guideline CALL EMS 911 NOW: * Immediate medical attention is needed. You need to hang up and call 911 (or an ambulance). * Triager Discretion: I'll call you back in a few minutes to be sure you were able to reach them. CARE ADVICE given per COVID-19 - DIAGNOSED OR SUSPECTED (Adult) guideline. TELL AMBULANCE MEDICS ABOUT YOUR COVID-19 DIAGNOSIS: * Tell the paramedic right away that you probably have COVID-19. * The paramedics should call ahead to the emergency department to let them know. TELL THE AMBULANCE DISPATCHER ABOUT COVID-19 DIAGNOSIS: * When you call 911, tell the dispatcher that you probably have COVID-19. Referrals GO TO FACILITY UNDECIDED

## 2021-03-18 NOTE — Telephone Encounter (Signed)
Should pt be seen or hospital?

## 2021-03-18 NOTE — Telephone Encounter (Signed)
Called and spoke with [name] on the phone Her dad was quite ill over Christmas and new years - he was seen in the ER on 12/30 and then EMS came out 2 days ago  She notes that he actually seems significantly improved the last 24 hours, oxygen saturations are now 90 to 95% blood pressure approximately 110/60.  She is concerned that her father sometimes gets food caught in some sort of outpouching in his throat.  This is happened before, she feels this could be happening again.  They have an appointment to see gastroenterology next week but would like to be seen sooner if possible  I made him an appointment for Thursday afternoon, we can potentially do a soft tissue CT scan of his neck at that time in addition to a chest x-ray. At this time did not feel emergency care is necessary and simply states her father is not willing to go back to the ER anyway.  He does really seem to be improving overall, is an eating more the last 24 hours

## 2021-03-18 NOTE — Telephone Encounter (Signed)
Pt daughter [name] states that pt has been having difficulty swallowing solids foods. Pt daughter state that pt was recently in the ED but they were not concerned. Pt scheduled for an OV for evaluation on 03/25/2021 at 3:00  with Carl Best NP. [place] aware. [name] verbalized understanding with all questions answered.

## 2021-03-19 NOTE — Progress Notes (Addendum)
[facility] [address] [place], [place] [address] [phone]  Date:  03/20/2021   Name:  Gregory [name]   DOB:  04/03/1932   MRN:  [id number]  PCP:  Darreld Mclean, MD    Chief Complaint: covid follow up (Pt was Dx with covid on 03/14/21. After hours call made on 03/18/20 in regards to residual sxs. He is very nauseous even with water. )   History of Present Illness:  Gregory [name] is a 88 y.o. very pleasant male patient who presents with the following:  Patient seen today in follow-up from recent COVID-30 illness I last saw him in September when he was in his normal state of health  Patient with history of CAD status post CABG 2003, A. 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  See phone note 1/3: Called and spoke with Gregory [name] on the phone Her dad was quite ill over Christmas and new years - he was seen in the ER on 12/30 and then EMS came out 2 days ago-since he notes they were at the house for quite some time She notes that he actually seems significantly improved the last 24 hours, oxygen saturations are now 90 to 95% blood pressure approximately 110/60. She is concerned that her father sometimes gets food caught in some sort of outpouching in his throat.  This is happened before, she feels this could be happening again.  They have an appointment to see gastroenterology next week but would like to be seen sooner if possible I made him an appointment for Thursday  afternoon, we can potentially do a soft tissue CT scan of his neck at that time in addition to a chest x-ray. At this time did not feel emergency care is necessary and simply states her father is not willing to go back to the ER anyway.  He does really seem to be improving overall, is an eating more the last 24 hours  Yesterday pt was doing pretty well- he was walking ok and ate well- soft foods This am however he seemed to be feeling worse again- he felt nauseated and has abd pain after he tried to eat breakfast  His daughters note that Gregory [name] took just a sip of water this morning, and then it was "like a switch flipped"-he had complained of abdominal pain, malaise, felt terrible with him  This am they felt like he had a mild fever although he did not check his temperature   Gregory [name] feels like his breathing is okay, but notes that he has generalized abdominal pain.  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 03/31/2011   Chronic a-fib ([place]) 03/31/2011   Cardiomyopathy, ischemic, improved 03/31/2011   CVA (cerebral vascular accident) ([place]) 03/31/2011   Renal calculi 03/31/2011   History of tobacco use, continues with  chewing tobacco 03/31/2011    Past Medical History:  Diagnosis Date   Acute respiratory failure ([place])    Appendicitis with abscess 03/31/2011   Arthritis    CAD (coronary artery disease),CABG 1993-LIMA to the LAD, SVG to Om and SVG to PDA/PLA, patent on cath 2008. 2003   a. s/p CABG 2003. b. last cath 2008 with patent grafts.   Chronic atrial fibrillation (HCC)    permanent   Chronic systolic CHF (congestive heart failure) ([place])    a. Prior low EF, later normalized.   Essential hypertension    Food impaction of esophagus    GERD (gastroesophageal reflux disease)    Heart murmur    Hyperlipidemia    Ischemic cardiomyopathy    Kidney stones    "passed all but one time when he had to have lithotripsy" (01/21/2013)   Renal infarct ([place])    a. 07/2015 - after holding Xarelto x 3 days for spinal procedure.   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 felt vasovagal in setting of pain from renal infarct.    Past Surgical History:  Procedure Laterality Date   BALLOON DILATION N/A 05/22/2015   Procedure: BALLOON DILATION;  Surgeon: Gatha Mayer, MD;  Location: [facility] ENDOSCOPY;  Service: Endoscopy;  Laterality: N/A;   BALLOON DILATION N/A 12/15/2018   Procedure: BALLOON DILATION;  Surgeon: Gatha Mayer, MD;  Location: [facility] ENDOSCOPY;  Service: Endoscopy;  Laterality: N/A;   CARDIAC CATHETERIZATION  02/24/2002   reduced LV function, 60-70% prox RCA stenosis, 70% PLA ostial stenosis, 80% secondary branch of PLA stenosis - subsequent CABG (Dr. Jackie Plum)   Carotid Doppler  06/2012   50-69% right bulb/prox ICA diameter reduction; 70-99% left bulb/prox ICA diameter reduction   CATARACT EXTRACTION W/ INTRAOCULAR LENS IMPLANT Bilateral 2013   COLONOSCOPY N/A 05/18/2016   Procedure: COLONOSCOPY;  Surgeon: Manus Gunning, MD;  Location: [facility] ENDOSCOPY;  Service:  Gastroenterology;  Laterality: N/A;   CORONARY ANGIOPLASTY  07/05/2006   3 vessel CAD, patent LIMA to LAD, patentVG to OM, patent SVG to PDA & PLA, mild MR, severe LV systolic dysfunction (Dr. Gerrie Nordmann)   CORONARY ARTERY BYPASS GRAFT  03/01/2002   LIMA to LAD, reverse SVG to OM, reverse SVG to PDA of RCA, reverse SVG to PLA of RCA, ligation of LA appendage (Dr. Servando Snare)   ENTEROSCOPY N/A 05/20/2016   Procedure: ENTEROSCOPY;  Surgeon: Manus Gunning, MD;  Location: [place];  Service: Gastroenterology;  Laterality: N/A;   ESOPHAGOGASTRODUODENOSCOPY  02/01/2012   Procedure: ESOPHAGOGASTRODUODENOSCOPY (EGD);  Surgeon: Beryle Beams, MD;  Location: [facility] ENDOSCOPY;  Service: Endoscopy;  Laterality: N/A;   ESOPHAGOGASTRODUODENOSCOPY N/A 05/22/2015   Procedure: ESOPHAGOGASTRODUODENOSCOPY (EGD);  Surgeon: Gatha Mayer, MD;  Location:  [facility] ENDOSCOPY;  Service: Endoscopy;  Laterality: N/A;   ESOPHAGOGASTRODUODENOSCOPY N/A 05/17/2016   Procedure: ESOPHAGOGASTRODUODENOSCOPY (EGD);  Surgeon: Juanita Craver, MD;  Location: [facility] ENDOSCOPY;  Service: Endoscopy;  Laterality: N/A;   ESOPHAGOGASTRODUODENOSCOPY N/A 09/17/2017   Procedure: ESOPHAGOGASTRODUODENOSCOPY (EGD);  Surgeon: Milus Banister, MD;  Location: [facility] ENDOSCOPY;  Service: Endoscopy;  Laterality: N/A;   ESOPHAGOGASTRODUODENOSCOPY Left 02/09/2020   Procedure: ESOPHAGOGASTRODUODENOSCOPY (EGD);  Surgeon: Lavena Bullion, DO;  Location: [facility] ENDOSCOPY;  Service: Gastroenterology;  Laterality: Left;   ESOPHAGOGASTRODUODENOSCOPY (EGD) WITH PROPOFOL N/A 12/15/2018   Procedure: ESOPHAGOGASTRODUODENOSCOPY (EGD) WITH PROPOFOL;  Surgeon: Gatha Mayer, MD;  Location: [facility] ENDOSCOPY;  Service: Endoscopy;  Laterality: N/A;   FOREIGN BODY REMOVAL  09/17/2017   Procedure: FOREIGN BODY REMOVAL;  Surgeon: Milus Banister, MD;  Location: [facility] ENDOSCOPY;  Service: Endoscopy;;   FOREIGN BODY REMOVAL  12/15/2018   Procedure: FOREIGN BODY REMOVAL;  Surgeon: Gatha Mayer,  MD;  Location: [facility] ENDOSCOPY;  Service: Endoscopy;;   FOREIGN BODY REMOVAL  02/09/2020   Procedure: FOREIGN BODY REMOVAL;  Surgeon: Lavena Bullion, DO;  Location: [place];  Service: Gastroenterology;;   GIVENS CAPSULE STUDY N/A 05/18/2016   Procedure: GIVENS CAPSULE STUDY;  Surgeon: Manus Gunning, MD;  Location: [place];  Service: Gastroenterology;  Laterality: N/A;   LAPAROSCOPIC APPENDECTOMY  03/30/2011   Procedure: APPENDECTOMY LAPAROSCOPIC;  Surgeon: Joyice Faster. Cornett, MD;  Location: [place];  Service: General;  Laterality: N/A;   LEFT HEART CATHETERIZATION WITH CORONARY ANGIOGRAM N/A 01/24/2013   Procedure: LEFT HEART CATHETERIZATION WITH CORONARY ANGIOGRAM;  Surgeon: [name], MD;  Location: [facility] CATH LAB;  Service: Cardiovascular;  Laterality: N/A;  Right heart with grafts   LITHOTRIPSY     "once" (01/21/2013)   [place]  05/2009   persantine myoview - fixed moderate perfusion defect in inferior wall & lateral segment of apex (poor non-transmural infarction), minimal anterolateral periinfarct reversible ischemia seen, abnormal study, defects similar to 2006 study   RIGHT/LEFT HEART CATH AND CORONARY ANGIOGRAPHY N/A 09/17/2017   Procedure: RIGHT/LEFT HEART CATH AND CORONARY ANGIOGRAPHY;  Surgeon: Martinique, Peter M, MD;  Location: [place] CV LAB;  Service: Cardiovascular;  Laterality: N/A;   SPLENECTOMY, TOTAL  01/2013   TEE WITHOUT CARDIOVERSION N/A 01/23/2013   Procedure: TRANSESOPHAGEAL ECHOCARDIOGRAM (TEE);  Surgeon: Sanda Klein, MD;  Location: [facility] ENDOSCOPY;  Service: Cardiovascular;  Laterality: N/A;   TRANSTHORACIC ECHOCARDIOGRAM  06/23/2012   EF 40-45%, mild LVH; mild AV regurg; mild MV regurg; LV mod-severely dilated; RV mildly dilated; systolic pressure borderline increased; RA mod dilated    Social History   Tobacco Use   Smoking status: Former    Packs/day: 2.00    Years: 20.00    Pack years: 40.00    Types: Cigarettes    Quit  date: 04/08/1986    Years since quitting: 34.9   Smokeless tobacco: Current    Types: Chew  Vaping Use   Vaping Use: Never used  Substance Use Topics   Alcohol use: No    Alcohol/week: 0.0 standard drinks   Drug use: No    Family History  Problem Relation Age of Onset   Heart disease Father        rheumatic fever   Heart attack Brother 86   Stroke Mother    Stroke Brother 54   Stomach cancer Neg Hx    Rectal cancer Neg Hx    Prostate cancer Neg Hx    Pancreatic cancer  Neg Hx    Ovarian cancer Neg Hx     Allergies  Allergen Reactions   Percocet [Oxycodone-Acetaminophen] Itching and Nausea Only    Medication list has been reviewed and updated.  Current Outpatient Medications on File Prior to Visit  Medication Sig Dispense Refill   apixaban (ELIQUIS) 2.5 MG TABS tablet TAKE 1 TABLET(2.5 MG) BY MOUTH TWICE DAILY 180 tablet 2   carvedilol (COREG) 3.125 MG tablet Take 1 tablet (3.125 mg total) by mouth 2 (two) times daily with a meal. TAKE 1 TABLET(3.125 MG) BY MOUTH TWICE DAILY WITH A MEAL 180 tablet 1   ezetimibe (ZETIA) 10 MG tablet TAKE 1 TABLET(10 MG) BY MOUTH DAILY 90 tablet 0   furosemide (LASIX) 40 MG tablet Take 20 mg by mouth.     levothyroxine (SYNTHROID) 25 MCG tablet Take 1 tablet (25 mcg total) by mouth daily before breakfast. 90 tablet 1   lisinopril (ZESTRIL) 5 MG tablet TAKE 1 TABLET(5 MG) BY MOUTH DAILY 90 tablet 3   ondansetron (ZOFRAN) 4 MG tablet Take 1 tablet (4 mg total) by mouth every 6 (six) hours. 12 tablet 0   pantoprazole (PROTONIX) 40 MG tablet TAKE 1 TABLET(40 MG) BY MOUTH TWICE DAILY 180 tablet 2   rOPINIRole (REQUIP) 0.25 MG tablet TAKE 1 TABLET(0.25 MG) BY MOUTH TWICE DAILY 180 tablet 1   rosuvastatin (CRESTOR) 40 MG tablet TAKE 1 TABLET(40 MG) BY MOUTH DAILY 90 tablet 1   tiZANidine (ZANAFLEX) 4 MG tablet TAKE 1 TABLET(4 MG) BY MOUTH EVERY 8 HOURS AS NEEDED FOR MUSCLE SPASMS. DO NOT COMBINE WITH TRAMADOL 60 tablet 1   traMADol (ULTRAM) 50 MG  tablet TAKE 1/2 TABLET BY MOUTH EVERY 8 HOURS AS NEEDED FOR PAIN.  Time for office visit please ! 30 tablet 0   No current facility-administered medications on file prior to visit.    Review of Systems:  As per HPI- otherwise negative.   Physical Examination: Vitals:   03/20/21 1301  BP: 108/62  Pulse: (!) 55  Resp: 18  SpO2: 98%   Vitals:   03/20/21 1301  Weight: 172 lb (78 kg)   Body mass index is 27.35 kg/m. Ideal Body Weight:    GEN: no acute distress.  Elderly man who appears to not feel well HEENT: Atraumatic, Normocephalic. Bilateral TM wnl, oropharynx erythematous but no exudate.  PEERL,EOMI.   Ears and Nose: No external deformity. CV: rate controlled a fib- No M/G/R. No JVD. No thrill. No extra heart sounds. PULM: CTA B, no wheezes, crackles, rhonchi. No retractions. No resp. distress. No accessory muscle use. ABD: S, ND, +BS. No rebound. No HSM.  Patient notes abdominal tenderness from the epigastrium to the umbilicus EXTR: No c/c/e PSYCH: Normally interactive. Conversant.  Using wheelchair today  Results for orders placed or performed in visit on 03/20/21  POCT Influenza A/B  Result Value Ref Range   Influenza A, POC Negative Negative   Influenza B, POC Negative Negative  POCT urinalysis dipstick  Result Value Ref Range   Color, UA yellow yellow   Clarity, UA cloudy (A) clear   Glucose, UA negative negative mg/dL   Bilirubin, UA negative negative   Ketones, POC UA negative negative mg/dL   Spec Grav, UA 1.020 1.010 - 1.025   Blood, UA negative negative   pH, UA 5.0 5.0 - 8.0   Protein Ur, POC =100 (A) negative mg/dL   Urobilinogen, UA 0.2 0.2 or 1.0 E.U./dL   Nitrite, UA Negative Negative  Leukocytes, UA Negative Negative    Assessment and Plan: Malaise - Plan: POCT Influenza A/B, Urine Culture  Nausea - Plan: POCT Influenza A/B, Urine Culture  Fatigue, unspecified type - Plan: POCT Influenza A/B, POCT urinalysis dipstick, Urine  Culture  COVID-19 - Plan: DG Chest 2 View  Patient seen today for follow-up of recent COVID-19 infection, malaise and nausea Patient is not definitely vomiting, but they note he will "spit up" if he tries to eat or drink anything since this morning.  His symptoms have waxed and waned over the last 2 weeks.  Yesterday he was better but as of this morning seems worse again.  Symptoms are somewhat vague, he simply states he does not feel well  Discussed in detail with patient and his daughters who are present  Patient risk is higher given age and comorbidities.  We can certainly try to work this up outpatient-however, he likely needs a CT abdomen pelvis, labs, and potentially IV hydration would also be helpful.  Given these factors we will have him seen in the emergency department  I will try to get ahead is much as possible but checking a flu, urine, and ordering chest film  Appreciate ER care of this patient  Signed Lamar Blinks, MD   Addnd 1/9 - received urine culture, positive for enterococcus sensitive to amox Called and spoke with daughter Gregory [name].  She reports her dad shows improvement, he is more active again, is eating and drinking normally.  I sent in a prescription for amoxicillin twice daily for 1 week We discussed decrease in renal function seen at the ER.  Suspect exacerbated by dehydration.  However they do plan to see his nephrologist within the next 1 to 2 weeks for follow-up.  Advised if this cannot be arranged I am also glad to order a BMP   Sample Source NOT GIVEN   STATUS: FINAL   ISOLATE 1: Enterococcus faecalis Abnormal    Comment: 10,000-49,000 CFU/mL of Enterococcus faecalis  Resulting Agency [facility]      Susceptibility   Enterococcus faecalis    URINE CULTURE POSITIVE 1    AMPICILLIN <=2  Sensitive    NITROFURANTOIN <=16  Sensitive 1    VANCOMYCIN 1  Sensitive

## 2021-03-20 ENCOUNTER — Emergency Department (HOSPITAL_BASED_OUTPATIENT_CLINIC_OR_DEPARTMENT_OTHER)
Admission: EM | Admit: 2021-03-20 | Discharge: 2021-03-20 | Disposition: A | Discharge status: Home / Self Care | Payer: Medicare Other | Attending: Emergency Medicine | Admitting: Emergency Medicine

## 2021-03-20 ENCOUNTER — Encounter: Payer: Self-pay | Admitting: Family Medicine

## 2021-03-20 ENCOUNTER — Ambulatory Visit (HOSPITAL_BASED_OUTPATIENT_CLINIC_OR_DEPARTMENT_OTHER)
Admission: RE | Admit: 2021-03-20 | Discharge: 2021-03-20 | Disposition: A | Discharge status: Home / Self Care | Payer: Medicare Other | Source: Ambulatory Visit | Attending: Family Medicine | Admitting: Family Medicine

## 2021-03-20 ENCOUNTER — Emergency Department (HOSPITAL_BASED_OUTPATIENT_CLINIC_OR_DEPARTMENT_OTHER): Payer: Medicare Other

## 2021-03-20 ENCOUNTER — Other Ambulatory Visit: Payer: Self-pay

## 2021-03-20 ENCOUNTER — Ambulatory Visit (INDEPENDENT_AMBULATORY_CARE_PROVIDER_SITE_OTHER): Payer: Medicare Other | Admitting: Family Medicine

## 2021-03-20 ENCOUNTER — Encounter (HOSPITAL_BASED_OUTPATIENT_CLINIC_OR_DEPARTMENT_OTHER): Payer: Self-pay | Admitting: *Deleted

## 2021-03-20 VITALS — BP 108/62 | HR 55 | Resp 18 | Wt 172.0 lb

## 2021-03-20 DIAGNOSIS — R5383 Other fatigue: Secondary | ICD-10-CM

## 2021-03-20 DIAGNOSIS — N390 Urinary tract infection, site not specified: Secondary | ICD-10-CM | POA: Diagnosis not present

## 2021-03-20 DIAGNOSIS — R5381 Other malaise: Secondary | ICD-10-CM | POA: Diagnosis not present

## 2021-03-20 DIAGNOSIS — R1084 Generalized abdominal pain: Secondary | ICD-10-CM | POA: Insufficient documentation

## 2021-03-20 DIAGNOSIS — Z28310 Unvaccinated for covid-19: Secondary | ICD-10-CM | POA: Diagnosis not present

## 2021-03-20 DIAGNOSIS — R110 Nausea: Secondary | ICD-10-CM | POA: Diagnosis not present

## 2021-03-20 DIAGNOSIS — E860 Dehydration: Secondary | ICD-10-CM | POA: Diagnosis not present

## 2021-03-20 DIAGNOSIS — U071 COVID-19: Secondary | ICD-10-CM

## 2021-03-20 DIAGNOSIS — R1310 Dysphagia, unspecified: Secondary | ICD-10-CM | POA: Insufficient documentation

## 2021-03-20 DIAGNOSIS — Z7901 Long term (current) use of anticoagulants: Secondary | ICD-10-CM | POA: Diagnosis not present

## 2021-03-20 DIAGNOSIS — K5730 Diverticulosis of large intestine without perforation or abscess without bleeding: Secondary | ICD-10-CM | POA: Diagnosis not present

## 2021-03-20 DIAGNOSIS — J9859 Other diseases of mediastinum, not elsewhere classified: Secondary | ICD-10-CM | POA: Diagnosis not present

## 2021-03-20 DIAGNOSIS — I517 Cardiomegaly: Secondary | ICD-10-CM | POA: Diagnosis not present

## 2021-03-20 LAB — COMPREHENSIVE METABOLIC PANEL
ALT: 65 U/L — ABNORMAL HIGH (ref 0–44)
AST: 112 U/L — ABNORMAL HIGH (ref 15–41)
Albumin: 2.6 g/dL — ABNORMAL LOW (ref 3.5–5.0)
Alkaline Phosphatase: 96 U/L (ref 38–126)
Anion gap: 11 (ref 5–15)
BUN: 34 mg/dL — ABNORMAL HIGH (ref 8–23)
CO2: 25 mmol/L (ref 22–32)
Calcium: 8.8 mg/dL — ABNORMAL LOW (ref 8.9–10.3)
Chloride: 97 mmol/L — ABNORMAL LOW (ref 98–111)
Creatinine, Ser: 2.14 mg/dL — ABNORMAL HIGH (ref 0.61–1.24)
GFR, Estimated: 29 mL/min — ABNORMAL LOW (ref >60)
Glucose, Bld: 101 mg/dL — ABNORMAL HIGH (ref 70–99)
Potassium: 4.4 mmol/L (ref 3.5–5.1)
Sodium: 133 mmol/L — ABNORMAL LOW (ref 135–145)
Total Bilirubin: 0.5 mg/dL (ref 0.3–1.2)
Total Protein: 7.1 g/dL (ref 6.5–8.1)

## 2021-03-20 LAB — CBC WITH DIFFERENTIAL/PLATELET
Abs Immature Granulocytes: 0.04 10*3/uL (ref 0.00–0.07)
Basophils Absolute: 0 10*3/uL (ref 0.0–0.1)
Basophils Relative: 0 %
Eosinophils Absolute: 0.1 10*3/uL (ref 0.0–0.5)
Eosinophils Relative: 2 %
HCT: 34.4 % — ABNORMAL LOW (ref 39.0–52.0)
Hemoglobin: 11.5 g/dL — ABNORMAL LOW (ref 13.0–17.0)
Immature Granulocytes: 1 %
Lymphocytes Relative: 24 %
Lymphs Abs: 1.6 10*3/uL (ref 0.7–4.0)
MCH: 31.6 pg (ref 26.0–34.0)
MCHC: 33.4 g/dL (ref 30.0–36.0)
MCV: 94.5 fL (ref 80.0–100.0)
Monocytes Absolute: 0.6 10*3/uL (ref 0.1–1.0)
Monocytes Relative: 9 %
Neutro Abs: 4.4 10*3/uL (ref 1.7–7.7)
Neutrophils Relative %: 64 %
Platelets: 257 10*3/uL (ref 150–400)
RBC: 3.64 MIL/uL — ABNORMAL LOW (ref 4.22–5.81)
RDW: 12.9 % (ref 11.5–15.5)
WBC: 6.8 10*3/uL (ref 4.0–10.5)
nRBC: 0 % (ref 0.0–0.2)

## 2021-03-20 LAB — POCT URINALYSIS DIP (MANUAL ENTRY)
Bilirubin, UA: NEGATIVE
Blood, UA: NEGATIVE
Glucose, UA: NEGATIVE mg/dL
Ketones, POC UA: NEGATIVE mg/dL
Leukocytes, UA: NEGATIVE
Nitrite, UA: NEGATIVE
Protein Ur, POC: 100 mg/dL — AB
Spec Grav, UA: 1.02 (ref 1.010–1.025)
Urobilinogen, UA: 0.2 E.U./dL
pH, UA: 5 (ref 5.0–8.0)

## 2021-03-20 LAB — POCT INFLUENZA A/B
Influenza A, POC: NEGATIVE
Influenza B, POC: NEGATIVE

## 2021-03-20 LAB — LIPASE, BLOOD: Lipase: 50 U/L (ref 11–51)

## 2021-03-20 LAB — MAGNESIUM: Magnesium: 1.9 mg/dL (ref 1.7–2.4)

## 2021-03-20 MED ORDER — PROCHLORPERAZINE MALEATE 10 MG PO TABS: 10.0000 mg | ORAL_TABLET | Freq: Two times a day (BID) | ORAL | 0 refills | Status: DC | PRN

## 2021-03-20 MED ORDER — LACTATED RINGERS IV BOLUS
1000.0000 mL | Freq: Once | INTRAVENOUS | Status: AC
Start: 2021-03-20 — End: 2021-03-20
  Administered 2021-03-20: 1000 mL via INTRAVENOUS

## 2021-03-20 MED ORDER — PROCHLORPERAZINE EDISYLATE 10 MG/2ML IJ SOLN
10.0000 mg | Freq: Once | INTRAMUSCULAR | Status: AC
  Administered 2021-03-20: 10 mg via INTRAVENOUS
  Filled 2021-03-20: qty 2

## 2021-03-20 NOTE — ED Triage Notes (Signed)
Pt was seen by  his MD this am for weakness, no appetite and nausea. He is Covid positive. His MD ordered an out pt cxr this am.

## 2021-03-20 NOTE — ED Provider Notes (Signed)
[facility] EMERGENCY DEPARTMENT Provider Note    CSN: [id number] Arrival date & time: 03/20/21 1453  History Chief Complaint  Patient presents with   Covid Positive    Gregory [name] is a 88 y.o. male with history of dysphagia s/p dilatation about a year ago has been sick for about 3 weeks with URI symptoms, multiple family members had influenza then. On 12/30 he was seen in the [facility] with nausea and difficulty swallowing solids, but able to keep down liquids and soft foods. He was found to have positive Covid, he is not vaccinated. He was initially doing better but daughter reports since last night he has had more difficulty swallowing/nausea, she reports everything he tries to swallow comes back up but also that he is able to keep some things down. HE has been complaining of epigastric abdominal pain with eating that is new today. He has not had any recent fevers. Was seen at PCP office today and told to come to the ED for evaluation. Daughter is mostly concerned about the swallowing difficulties he has had today.    Home Medications Prior to Admission medications   Medication Sig Start Date End Date Taking? Authorizing Provider  prochlorperazine (COMPAZINE) 10 MG tablet Take 1 tablet (10 mg total) by mouth 2 (two) times daily as needed for nausea or vomiting. 03/20/21  Yes Truddie Hidden, MD  apixaban (ELIQUIS) 2.5 MG TABS tablet TAKE 1 TABLET(2.5 MG) BY MOUTH TWICE DAILY 12/16/20   [name], [name], MD  carvedilol (COREG) 3.125 MG tablet Take 1 tablet (3.125 mg total) by mouth 2 (two) times daily with a meal. TAKE 1 TABLET(3.125 MG) BY MOUTH TWICE DAILY WITH A MEAL 01/24/21   Troy Sine, MD  ezetimibe (ZETIA) 10 MG tablet TAKE 1 TABLET(10 MG) BY MOUTH DAILY 12/25/20   Troy Sine, MD  furosemide (LASIX) 40 MG tablet Take 20 mg by mouth.    [provider]  levothyroxine (SYNTHROID) 25 MCG tablet Take 1 tablet (25 mcg total) by mouth daily before breakfast.  09/20/20   Troy Sine, MD  lisinopril (ZESTRIL) 5 MG tablet TAKE 1 TABLET(5 MG) BY MOUTH DAILY 11/15/20   Mosie Lukes, MD  ondansetron (ZOFRAN) 4 MG tablet Take 1 tablet (4 mg total) by mouth every 6 (six) hours. 03/14/21   Luna Fuse, MD  pantoprazole (PROTONIX) 40 MG tablet TAKE 1 TABLET(40 MG) BY MOUTH TWICE DAILY 01/28/21   Troy Sine, MD  rOPINIRole (REQUIP) 0.25 MG tablet TAKE 1 TABLET(0.25 MG) BY MOUTH TWICE DAILY 02/13/21   [name], [name], MD  rosuvastatin (CRESTOR) 40 MG tablet TAKE 1 TABLET(40 MG) BY MOUTH DAILY 08/19/20   Troy Sine, MD  tiZANidine (ZANAFLEX) 4 MG tablet TAKE 1 TABLET(4 MG) BY MOUTH EVERY 8 HOURS AS NEEDED FOR MUSCLE SPASMS. DO NOT COMBINE WITH TRAMADOL 11/27/20   [name], [name], MD  traMADol (ULTRAM) 50 MG tablet TAKE 1/2 TABLET BY MOUTH EVERY 8 HOURS AS NEEDED FOR PAIN.  Time for office visit please ! 11/27/20   [name], [name], MD     Allergies    Percocet [oxycodone-acetaminophen]   Review of Systems   Review of Systems Please see HPI for pertinent positives and negatives  Physical Exam BP 132/85    Pulse 71    Temp 98.1 F (36.7 C) (Oral)    Resp 17    Ht 5' 6.5" (1.689 m)    Wt 78 kg  SpO2 97%    BMI 27.34 kg/m   Physical Exam Vitals and nursing note reviewed.  Constitutional:      Appearance: Normal appearance.  HENT:     Head: Normocephalic and atraumatic.     Nose: Nose normal.     Mouth/Throat:     Mouth: Mucous membranes are moist.  Eyes:     Extraocular Movements: Extraocular movements intact.     Conjunctiva/sclera: Conjunctivae normal.  Cardiovascular:     Rate and Rhythm: Normal rate.  Pulmonary:     Effort: Pulmonary effort is normal.     Breath sounds: Normal breath sounds. No wheezing or rales.  Abdominal:     General: Abdomen is flat.     Palpations: Abdomen is soft.     Tenderness: There is abdominal tenderness (diffuse). There is no guarding.  Musculoskeletal:        General: No swelling. Normal  range of motion.     Cervical back: Neck supple.  Skin:    General: Skin is warm and dry.  Neurological:     General: No focal deficit present.     Mental Status: He is alert.  Psychiatric:        Mood and Affect: Mood normal.    ED Results / Procedures / Treatments   EKG None  Procedures Procedures  Medications Ordered in the ED Medications  lactated ringers bolus 1,000 mL (1,000 mLs Intravenous New Bag/Given 03/20/21 1624)  prochlorperazine (COMPAZINE) injection 10 mg (10 mg Intravenous Given 03/20/21 1701)    Initial Impression and Plan Patient able to swallow water without difficulty, no regurgitation to suggest esophageal obstruction. Will check his labs given his change in symptoms today, IVF for possible dehydration and send for CT to evaluation intrathoracic or intraabdominal process which would be causing his dysphagia to worsen suddenly.   ED Course   Clinical Course as of 03/20/21 1815  Thu Mar 20, 2021  1646 CBC with mild anemia at baseline, otherwise unremarkable.  [CS]  3474 CMP shows Cr has worsened from baseline, unable to get IV contrast per radiology protocol. Albumin is also low indicating some degree of protein malnutrition, no recent for comparison. His Lipase and Mg are normal.  [CS]  2595 Reviewed results with the patient and family at bedside. Given his declining function today and his worsening creatinine I recommend the patient be admitted for hydration and further evaluation of his dysphagia however he refuses admission. He is insistent he wants to go home. Will give Rx for Compazine to see if that helps him more than Zofran he has had. Encouraged to drink plenty of fluids and soft food as able. Follow up with GI as scheduled next week.  [CS]    Clinical Course User Index [CS] Truddie Hidden, MD     MDM Rules/Calculators/A&P Medical Decision Making Problems Addressed: COVID-19: acute illness or injury with systemic symptoms Dehydration: acute  illness or injury with systemic symptoms Dysphagia, unspecified type: chronic illness or injury with exacerbation, progression, or side effects of treatment Generalized abdominal pain: acute illness or injury  Amount and/or Complexity of Data Reviewed External Data Reviewed: radiology and notes.    Details: PCP Labs: ordered. Decision-making details documented in ED Course. Radiology: ordered and independent interpretation performed. Decision-making details documented in ED Course.  Risk Prescription drug management. Decision regarding hospitalization.    Final Clinical Impression(s) / ED Diagnoses Final diagnoses:  Dysphagia, unspecified type  Dehydration  COVID-19  Generalized abdominal pain  Rx / DC Orders ED Discharge Orders          Ordered    prochlorperazine (COMPAZINE) 10 MG tablet  2 times daily PRN        03/20/21 1814             Truddie Hidden, MD 03/20/21 1815

## 2021-03-20 NOTE — ED Triage Notes (Signed)
He was positive for Covid a week ago. Family states he is weak. No appetite. Nausea. C.o  abdominal pain.

## 2021-03-22 ENCOUNTER — Other Ambulatory Visit: Payer: Self-pay | Admitting: Cardiovascular Disease

## 2021-03-22 LAB — URINE CULTURE
MICRO NUMBER:: 12831936
SPECIMEN QUALITY:: ADEQUATE

## 2021-03-24 MED ORDER — AMOXICILLIN 500 MG PO CAPS: 500.0000 mg | ORAL_CAPSULE | Freq: Two times a day (BID) | ORAL | 0 refills | Status: DC

## 2021-03-24 NOTE — Addendum Note (Signed)
Addended by: Lamar Blinks C on: 03/24/2021 10:19 AM   Modules accepted: Orders

## 2021-03-25 ENCOUNTER — Telehealth: Payer: Self-pay | Admitting: Family Medicine

## 2021-03-25 ENCOUNTER — Emergency Department (HOSPITAL_COMMUNITY): Payer: Medicare Other

## 2021-03-25 ENCOUNTER — Emergency Department (HOSPITAL_COMMUNITY)
Admission: EM | Admit: 2021-03-25 | Discharge: 2021-03-25 | Disposition: A | Discharge status: Home / Self Care | Payer: Medicare Other | Attending: Emergency Medicine | Admitting: Emergency Medicine

## 2021-03-25 ENCOUNTER — Encounter (HOSPITAL_COMMUNITY): Payer: Self-pay | Admitting: Emergency Medicine

## 2021-03-25 ENCOUNTER — Ambulatory Visit: Payer: Medicare Other | Admitting: Nurse Practitioner

## 2021-03-25 DIAGNOSIS — M160 Bilateral primary osteoarthritis of hip: Secondary | ICD-10-CM | POA: Diagnosis not present

## 2021-03-25 DIAGNOSIS — M79605 Pain in left leg: Secondary | ICD-10-CM | POA: Diagnosis not present

## 2021-03-25 DIAGNOSIS — U071 COVID-19: Secondary | ICD-10-CM

## 2021-03-25 DIAGNOSIS — Z79899 Other long term (current) drug therapy: Secondary | ICD-10-CM | POA: Diagnosis not present

## 2021-03-25 DIAGNOSIS — I4891 Unspecified atrial fibrillation: Secondary | ICD-10-CM | POA: Diagnosis not present

## 2021-03-25 DIAGNOSIS — M5432 Sciatica, left side: Secondary | ICD-10-CM | POA: Diagnosis not present

## 2021-03-25 DIAGNOSIS — M545 Low back pain, unspecified: Secondary | ICD-10-CM | POA: Diagnosis not present

## 2021-03-25 DIAGNOSIS — M5441 Lumbago with sciatica, right side: Secondary | ICD-10-CM

## 2021-03-25 DIAGNOSIS — I509 Heart failure, unspecified: Secondary | ICD-10-CM | POA: Insufficient documentation

## 2021-03-25 DIAGNOSIS — I2510 Atherosclerotic heart disease of native coronary artery without angina pectoris: Secondary | ICD-10-CM | POA: Insufficient documentation

## 2021-03-25 DIAGNOSIS — I110 Hypertensive heart disease with heart failure: Secondary | ICD-10-CM | POA: Insufficient documentation

## 2021-03-25 DIAGNOSIS — M5431 Sciatica, right side: Secondary | ICD-10-CM | POA: Diagnosis not present

## 2021-03-25 DIAGNOSIS — M2578 Osteophyte, vertebrae: Secondary | ICD-10-CM | POA: Diagnosis not present

## 2021-03-25 DIAGNOSIS — M5442 Lumbago with sciatica, left side: Secondary | ICD-10-CM

## 2021-03-25 DIAGNOSIS — M4316 Spondylolisthesis, lumbar region: Secondary | ICD-10-CM | POA: Diagnosis not present

## 2021-03-25 DIAGNOSIS — M79604 Pain in right leg: Secondary | ICD-10-CM | POA: Diagnosis not present

## 2021-03-25 DIAGNOSIS — M4807 Spinal stenosis, lumbosacral region: Secondary | ICD-10-CM | POA: Diagnosis not present

## 2021-03-25 DIAGNOSIS — R531 Weakness: Secondary | ICD-10-CM

## 2021-03-25 DIAGNOSIS — M48061 Spinal stenosis, lumbar region without neurogenic claudication: Secondary | ICD-10-CM | POA: Diagnosis not present

## 2021-03-25 DIAGNOSIS — R509 Fever, unspecified: Secondary | ICD-10-CM | POA: Diagnosis not present

## 2021-03-25 DIAGNOSIS — M79606 Pain in leg, unspecified: Secondary | ICD-10-CM | POA: Diagnosis not present

## 2021-03-25 LAB — LACTIC ACID, PLASMA
Lactic Acid, Venous: 1.1 mmol/L (ref 0.5–1.9)
Lactic Acid, Venous: 1.1 mmol/L (ref 0.5–1.9)

## 2021-03-25 LAB — URINALYSIS, ROUTINE W REFLEX MICROSCOPIC
Bacteria, UA: NONE SEEN
Bilirubin Urine: NEGATIVE
Glucose, UA: NEGATIVE mg/dL
Hgb urine dipstick: NEGATIVE
Ketones, ur: NEGATIVE mg/dL
Leukocytes,Ua: NEGATIVE
Nitrite: NEGATIVE
Protein, ur: 30 mg/dL — AB
Specific Gravity, Urine: 1.011 (ref 1.005–1.030)
pH: 8 (ref 5.0–8.0)

## 2021-03-25 LAB — COMPREHENSIVE METABOLIC PANEL
ALT: 48 U/L — ABNORMAL HIGH (ref 0–44)
AST: 87 U/L — ABNORMAL HIGH (ref 15–41)
Albumin: 2.5 g/dL — ABNORMAL LOW (ref 3.5–5.0)
Alkaline Phosphatase: 96 U/L (ref 38–126)
Anion gap: 10 (ref 5–15)
BUN: 20 mg/dL (ref 8–23)
CO2: 29 mmol/L (ref 22–32)
Calcium: 8.4 mg/dL — ABNORMAL LOW (ref 8.9–10.3)
Chloride: 98 mmol/L (ref 98–111)
Creatinine, Ser: 1.56 mg/dL — ABNORMAL HIGH (ref 0.61–1.24)
GFR, Estimated: 43 mL/min — ABNORMAL LOW (ref >60)
Glucose, Bld: 99 mg/dL (ref 70–99)
Potassium: 4.4 mmol/L (ref 3.5–5.1)
Sodium: 137 mmol/L (ref 135–145)
Total Bilirubin: 0.7 mg/dL (ref 0.3–1.2)
Total Protein: 7.5 g/dL (ref 6.5–8.1)

## 2021-03-25 LAB — RESP PANEL BY RT-PCR (FLU A&B, COVID) ARPGX2
Influenza A by PCR: NEGATIVE
Influenza B by PCR: NEGATIVE
SARS Coronavirus 2 by RT PCR: POSITIVE — AB

## 2021-03-25 LAB — CBC WITH DIFFERENTIAL/PLATELET
Abs Immature Granulocytes: 0.06 10*3/uL (ref 0.00–0.07)
Basophils Absolute: 0 10*3/uL (ref 0.0–0.1)
Basophils Relative: 0 %
Eosinophils Absolute: 0 10*3/uL (ref 0.0–0.5)
Eosinophils Relative: 0 %
HCT: 36 % — ABNORMAL LOW (ref 39.0–52.0)
Hemoglobin: 11.6 g/dL — ABNORMAL LOW (ref 13.0–17.0)
Immature Granulocytes: 1 %
Lymphocytes Relative: 17 %
Lymphs Abs: 1.8 10*3/uL (ref 0.7–4.0)
MCH: 30.9 pg (ref 26.0–34.0)
MCHC: 32.2 g/dL (ref 30.0–36.0)
MCV: 96 fL (ref 80.0–100.0)
Monocytes Absolute: 1 10*3/uL (ref 0.1–1.0)
Monocytes Relative: 9 %
Neutro Abs: 7.4 10*3/uL (ref 1.7–7.7)
Neutrophils Relative %: 73 %
Platelets: 264 10*3/uL (ref 150–400)
RBC: 3.75 MIL/uL — ABNORMAL LOW (ref 4.22–5.81)
RDW: 13.1 % (ref 11.5–15.5)
WBC: 10.2 10*3/uL (ref 4.0–10.5)
nRBC: 0 % (ref 0.0–0.2)

## 2021-03-25 MED ORDER — HYDROCODONE-ACETAMINOPHEN 5-325 MG PO TABS
1.0000 | ORAL_TABLET | Freq: Once | ORAL | Status: DC
  Filled 2021-03-25: qty 1

## 2021-03-25 MED ORDER — TRAMADOL HCL 50 MG PO TABS: 50.0000 mg | ORAL_TABLET | Freq: Four times a day (QID) | ORAL | 0 refills | Status: DC | PRN

## 2021-03-25 MED ORDER — FENTANYL CITRATE PF 50 MCG/ML IJ SOSY
50.0000 ug | PREFILLED_SYRINGE | Freq: Once | INTRAMUSCULAR | Status: AC
  Administered 2021-03-25: 50 ug via INTRAVENOUS
  Filled 2021-03-25: qty 1

## 2021-03-25 MED ORDER — HYDROCODONE-ACETAMINOPHEN 5-325 MG PO TABS
1.0000 | ORAL_TABLET | Freq: Once | ORAL | Status: AC
  Administered 2021-03-25: 1 via ORAL
  Filled 2021-03-25: qty 1

## 2021-03-25 NOTE — ED Provider Triage Note (Signed)
Emergency Medicine Provider Triage Evaluation Note  Gregory [name] , a 88 y.o. male  was evaluated in triage.  Pt complains of pain to bilateral upper legs.  States the pain started this morning after waking.  Pain has been constant since then.  Patient denies any recent falls or injuries.  Per chart review patient tested positive for COVID-19 on 12/30.  Has been seen in the emergency department since then due to concerns of trouble swallowing and decreased p.o. intake.  Review of Systems  Positive: Bilateral upper leg pain Negative: Numbness, weakness, color change, rash, leg swelling  Physical Exam  BP (!) 156/65    Pulse 83    Temp 99.9 F (37.7 C) (Oral)    Resp 20    SpO2 91%  Gen:   Awake, no distress   Resp:  Normal effort  MSK:   Moves extremities without difficulty; patient has tenderness to bilateral anterior thighs.  No deformity or bony deformity noted.  +1 DP pulse bilaterally.  Full range of motion to bilateral knees and ankles. Other:    Medical Decision Making  Medically screening exam initiated at 12:59 PM.  Appropriate orders placed.  Gregory [name] was informed that the remainder of the evaluation will be completed by another provider, this initial triage assessment does not replace that evaluation, and the importance of remaining in the ED until their evaluation is complete.     Loni Beckwith, PA-C 03/25/21 1300

## 2021-03-25 NOTE — ED Notes (Signed)
Pt. Ambulated in room without difficulty. Pt. States that its not as painful as it was earlier, but still feels weak.

## 2021-03-25 NOTE — ED Provider Notes (Signed)
[facility] DEPT Provider Note   CSN: [id number] Arrival date & time: 03/25/21  1230     History  Chief Complaint  Patient presents with   Leg Pain    Gregory [name] is a 88 y.o. male.  Patient is a 88 year old male with a history of coronary artery disease, CHF, A. fib on Eliquis, hypertension, hyperlipidemia, prior stroke who presents with leg pain.  His daughter is at bedside and helps provide history.  He recently was diagnosed with COVID on December 30.  He had been feeling weak and had 24-hour care.  He had been doing better over the last couple days and last night was on his own for the first time.  He started complaining of some pain in his legs yesterday and it was worse today.  He says it starts in his back and radiates down his both of his legs.  Says it hurts in both of his thighs.  There is no pain in his lower legs.  He has been having a hard time walking because of the pain and says it hurts when he puts weight on his legs.  He has not reported any weakness to his legs.  No numbness to his legs.  No loss of bowel or bladder control.  No recent fevers.  His daughter reports he was recently diagnosed with a UTI and is currently on amoxicillin for this.  He has had some back issues before but never pain in his legs like this.  No known recent falls or trauma.      Home Medications Prior to Admission medications   Medication Sig Start Date End Date Taking? Authorizing Provider  acetaminophen (TYLENOL) 500 MG tablet Take 500 mg by mouth every 6 (six) hours as needed for moderate pain.   Yes [provider]  amoxicillin (AMOXIL) 500 MG capsule Take 1 capsule (500 mg total) by mouth 2 (two) times daily. 03/24/21  Yes [name], [name], MD  apixaban (ELIQUIS) 2.5 MG TABS tablet TAKE 1 TABLET(2.5 MG) BY MOUTH TWICE DAILY 12/16/20  Yes [name], [name], MD  carvedilol (COREG) 3.125 MG tablet Take 1 tablet (3.125 mg total) by mouth 2 (two) times  daily with a meal. TAKE 1 TABLET(3.125 MG) BY MOUTH TWICE DAILY WITH A MEAL 01/24/21  Yes Troy Sine, MD  ezetimibe (ZETIA) 10 MG tablet TAKE 1 TABLET(10 MG) BY MOUTH DAILY 03/24/21  Yes Troy Sine, MD  levothyroxine (SYNTHROID) 25 MCG tablet Take 1 tablet (25 mcg total) by mouth daily before breakfast. 09/20/20  Yes Troy Sine, MD  lisinopril (ZESTRIL) 5 MG tablet TAKE 1 TABLET(5 MG) BY MOUTH DAILY 11/15/20  Yes Mosie Lukes, MD  ondansetron (ZOFRAN) 4 MG tablet Take 1 tablet (4 mg total) by mouth every 6 (six) hours. 03/14/21  Yes Luna Fuse, MD  pantoprazole (PROTONIX) 40 MG tablet TAKE 1 TABLET(40 MG) BY MOUTH TWICE DAILY 01/28/21  Yes Troy Sine, MD  prochlorperazine (COMPAZINE) 10 MG tablet Take 1 tablet (10 mg total) by mouth 2 (two) times daily as needed for nausea or vomiting. 03/20/21  Yes Truddie Hidden, MD  rOPINIRole (REQUIP) 0.25 MG tablet TAKE 1 TABLET(0.25 MG) BY MOUTH TWICE DAILY 02/13/21  Yes [name], [name], MD  rosuvastatin (CRESTOR) 40 MG tablet TAKE 1 TABLET(40 MG) BY MOUTH DAILY 08/19/20  Yes Troy Sine, MD  tiZANidine (ZANAFLEX) 4 MG tablet TAKE 1 TABLET(4 MG) BY MOUTH EVERY 8 HOURS AS  NEEDED FOR MUSCLE SPASMS. DO NOT COMBINE WITH TRAMADOL 11/27/20  Yes [name], [name], MD  traMADol (ULTRAM) 50 MG tablet Take 1 tablet (50 mg total) by mouth every 6 (six) hours as needed. 03/25/21  Yes Malvin Johns, MD  furosemide (LASIX) 40 MG tablet Take 20 mg by mouth.    [provider]      Allergies    Percocet [oxycodone-acetaminophen]    Review of Systems   Review of Systems  Constitutional:  Negative for chills, diaphoresis, fatigue and fever.  HENT:  Negative for congestion, rhinorrhea and sneezing.   Eyes: Negative.   Respiratory:  Negative for cough, chest tightness and shortness of breath.   Cardiovascular:  Negative for chest pain and leg swelling.  Gastrointestinal:  Negative for abdominal pain, blood in stool, diarrhea, nausea and  vomiting.  Genitourinary:  Negative for difficulty urinating, flank pain, frequency and hematuria.  Musculoskeletal:  Positive for back pain and myalgias. Negative for arthralgias.  Skin:  Negative for rash.  Neurological:  Negative for dizziness, speech difficulty, weakness, numbness and headaches.   Physical Exam Updated Vital Signs BP (!) 104/44    Pulse 75    Temp 100.1 F (37.8 C) (Oral)    Resp 19    SpO2 95%  Physical Exam Constitutional:      Appearance: He is well-developed.  HENT:     Head: Normocephalic and atraumatic.  Eyes:     Pupils: Pupils are equal, round, and reactive to light.  Cardiovascular:     Rate and Rhythm: Normal rate and regular rhythm.     Heart sounds: Normal heart sounds.  Pulmonary:     Effort: Pulmonary effort is normal. No respiratory distress.     Breath sounds: Normal breath sounds. No wheezing or rales.  Chest:     Chest wall: No tenderness.  Abdominal:     General: Bowel sounds are normal.     Palpations: Abdomen is soft.     Tenderness: There is no abdominal tenderness. There is no guarding or rebound.  Musculoskeletal:        General: Normal range of motion.     Cervical back: Normal range of motion and neck supple.     Comments: Positive tenderness to his lower lumbar spine and across the lumbar region bilaterally.  Negative straight leg raise bilaterally.  Patellar reflexes symmetric bilaterally.  He has normal sensation to his lower extremities.  He has good flexion-extension of the feet bilaterally.  He is able to raise both legs off the bed bilaterally.  Pedal pulses are intact.  No swelling is noted.  No color change.  Lymphadenopathy:     Cervical: No cervical adenopathy.  Skin:    General: Skin is warm and dry.     Findings: No rash.  Neurological:     Mental Status: He is alert and oriented to person, place, and time.    ED Results / Procedures / Treatments   Labs (all labs ordered are listed, but only abnormal results are  displayed) Labs Reviewed  RESP PANEL BY RT-PCR (FLU A&B, COVID) ARPGX2 - Abnormal; Notable for the following components:      Result Value   SARS Coronavirus 2 by RT PCR POSITIVE (*)    All other components within normal limits  COMPREHENSIVE METABOLIC PANEL - Abnormal; Notable for the following components:   Creatinine, Ser 1.56 (*)    Calcium 8.4 (*)    Albumin 2.5 (*)    AST 87 (*)  ALT 48 (*)    GFR, Estimated 43 (*)    All other components within normal limits  CBC WITH DIFFERENTIAL/PLATELET - Abnormal; Notable for the following components:   RBC 3.75 (*)    Hemoglobin 11.6 (*)    HCT 36.0 (*)    All other components within normal limits  URINALYSIS, ROUTINE W REFLEX MICROSCOPIC - Abnormal; Notable for the following components:   Protein, ur 30 (*)    All other components within normal limits  CULTURE, BLOOD (ROUTINE X 2)  CULTURE, BLOOD (ROUTINE X 2)  LACTIC ACID, PLASMA  LACTIC ACID, PLASMA    EKG None  Radiology DG Lumbar Spine Complete  Result Date: 03/25/2021 CLINICAL DATA:  Back pain. EXAM: LUMBAR SPINE - COMPLETE 4+ VIEW COMPARISON:  CT enterography 05/19/2016. FINDINGS: Spinal alignment is within normal limits. There is no evidence for acute fracture. There is mild chronic anterior wedge deformity of T12 and T11 which appears unchanged from prior. There is moderate disc space narrowing at L5-S1 and mild disc space narrowing at L4-L5. Endplate osteophytes are seen throughout the lumbar spine similar to prior examination. There are atherosclerotic calcifications of the aorta. IMPRESSION: 1. No acute bony abnormality. 2. Stable mild/moderate degenerative changes. Electronically Signed   By: Ronney Asters M.D.   On: 03/25/2021 17:23   MR LUMBAR SPINE WO CONTRAST  Result Date: 03/25/2021 CLINICAL DATA:  Low back pain, cauda equina syndrome suspected. Bilateral leg pain. EXAM: MRI LUMBAR SPINE WITHOUT CONTRAST TECHNIQUE: Multiplanar, multisequence MR imaging of the  lumbar spine was performed. No intravenous contrast was administered. COMPARISON:  Lumbar spine MRI 06/12/2015 FINDINGS: Segmentation:  Standard. Alignment:  Chronic trace retrolisthesis of L2 on L3 and L3 on L4. Vertebrae: No acute fracture, suspicious marrow lesion, or evidence of discitis. Unchanged mild chronic anterior wedging of the T12 vertebral body. Scattered small Schmorl's nodes and multilevel degenerative endplate changes. Conus medullaris and cauda equina: Conus extends to the L1 level. Conus and cauda equina appear normal. Paraspinal and other soft tissues: Asymmetric left renal cortical thinning and scarring with slightly prominent left extrarenal pelvis. Disc levels: Disc desiccation throughout the lumbar spine. Moderate disc space narrowing at L4-5 and L5-S1. T12-L1: Negative. L1-2: Minimal disc bulging without stenosis, unchanged. L2-3: Retrolisthesis with bulging uncovered disc and mild-to-moderate right and mild left facet hypertrophy result in mild bilateral lateral recess stenosis and borderline bilateral neural foraminal stenosis, mildly progressed. No spinal stenosis. L3-4: Circumferential disc bulging and mild-to-moderate facet and ligamentum flavum hypertrophy result in moderate bilateral lateral recess stenosis and mild bilateral neural foraminal stenosis without significant spinal stenosis, unchanged. The L4 nerve roots may be affected in the lateral recesses. L4-5: Circumferential disc bulging and mild-to-moderate facet and ligamentum flavum hypertrophy result in mild-to-moderate bilateral lateral recess stenosis and mild right and mild-to-moderate bilateral neural foraminal stenosis without significant spinal stenosis, unchanged. L5-S1: Disc bulging and moderate facet and ligamentum flavum hypertrophy result in mild-to-moderate bilateral lateral recess stenosis and moderate right and severe left neural foraminal stenosis without significant generalized spinal stenosis, not significantly  changed. IMPRESSION: 1. Mild progression of mild bilateral lateral recess stenosis at L2-3. 2. Unchanged disc and facet degeneration elsewhere with mild-to-moderate lateral recess and neural foraminal stenosis as above. Electronically Signed   By: Logan Bores M.D.   On: 03/25/2021 21:13   DG Chest Port 1 View  Result Date: 03/25/2021 CLINICAL DATA:  Fever.  Positive COVID-19 test 03/14/2021. EXAM: PORTABLE CHEST 1 VIEW COMPARISON:  Chest two views 03/20/2021 FINDINGS: Status  post median sternotomy. Cardiac silhouette is again mildly to moderately enlarged. Mediastinal contours are unchanged with calcification moderately seen within the aortic arch. Mildly decreased lung volumes. Mild scattered bilateral mid and lower lung linear densities, greatest within the left mid and lower lung, similar to prior. No focal airspace opacity to indicate pneumonia. No pulmonary edema, pleural effusion, or pneumothorax. No acute skeletal abnormality. IMPRESSION: No significant change from prior. Mild scattered linear densities that may represent atelectasis versus the sequelae of recent COVID infection. No focal airspace consolidation. Electronically Signed   By: Yvonne Kendall   On: 03/25/2021 19:06   DG Hips Bilat W or Wo Pelvis 3-4 Views  Result Date: 03/25/2021 CLINICAL DATA:  Bilateral upper leg pain EXAM: DG HIP (WITH OR WITHOUT PELVIS) 3-4V BILAT COMPARISON:  03/14/2021. FINDINGS: There is no evidence of hip fracture or dislocation. Hip joint spaces are maintained. Lower lumbar degenerative change. Calcific atherosclerosis. IMPRESSION: Negative. Electronically Signed   By: Margaretha Sheffield M.D.   On: 03/25/2021 13:38    Procedures Procedures    Medications Ordered in ED Medications  fentaNYL (SUBLIMAZE) injection 50 mcg (50 mcg Intravenous Given 03/25/21 1718)  HYDROcodone-acetaminophen (NORCO/VICODIN) 5-325 MG per tablet 1 tablet (1 tablet Oral Given 03/25/21 2134)    ED Course/ Medical Decision Making/  A&P                           Medical Decision Making  Patient is a 88 year old male who presents with back and leg pain.  He has back pain that seems to be radiating to his thighs.  He has no neurologic deficits.  No loss of bowel or bladder control.  However he is on Eliquis.  There was some question of some recent falls.  X-rays were done of his lumbar spine and his hips which were reviewed by me and showed no acute abnormality.  He did develop a fever in the ED of 100.1.  Labs were performed.  His CBC shows normal white count.  His lactate is normal.  His urinalysis shows no evidence of infection.  He had a chest x-ray which was reviewed by me and shows no evidence of pneumonia.  MRI of his back was performed which shows some degenerative changes but no acute abnormality.  No signs of cauda equina.  No epidural hematoma or signs of infection.  He was given pain medicine in the ED including fentanyl and then a Vicodin.  He is feeling better and is able to ambulate without difficulty.  He does have some general weakness and his COVID test is still positive.  His weakness is likely related to that.  He has no hypoxia.  No increased work of breathing.  No evidence of pneumonia.  His creatinine is elevated but similar to prior values.  He was discharged home in good condition.  He was given a prescription for tramadol.  He was encouraged to follow-up with his PCP.  Return precautions were given.  Care was discussed and additional information from his history was obtained from his daughter.  Final Clinical Impression(s) / ED Diagnoses Final diagnoses:  Weakness  COVID-19 virus infection  Acute midline low back pain with bilateral sciatica    Rx / DC Orders ED Discharge Orders          Ordered    traMADol (ULTRAM) 50 MG tablet  Every 6 hours PRN        03/25/21 2256  Malvin Johns, MD 03/25/21 2300

## 2021-03-25 NOTE — Telephone Encounter (Signed)
Pt daughter called and stated pt legs feel like they are on fire. He states he is in so much pain and his level is at a 10. His BP has been crazy since the leg pain and they don't know what to do. Transferred to triage for further advice.

## 2021-03-25 NOTE — ED Triage Notes (Signed)
Per-EMS-history of restless leg syndrome-could not get out of bed this am due to pain and burning in legs and feet

## 2021-03-25 NOTE — Telephone Encounter (Signed)
Pt's daughter called [place] back stating triage nurse transferred her back to [place], offered to make an OV with another provider due to Dr. Lillie Fragmin schedule but she declined and wanted to see what she recommended first. Please advise.

## 2021-03-25 NOTE — Telephone Encounter (Signed)
Called Gregory [name] back- I apologized that this phone call message was not sent to me until 5:30 for some reason. They are in the ER right now- we talked about next steps and I offered advice to the best of my ability

## 2021-03-25 NOTE — Discharge Instructions (Signed)
Follow-up with your primary care doctor for recheck.  Return to the emergency room if you have any worsening symptoms.

## 2021-03-28 ENCOUNTER — Other Ambulatory Visit: Payer: Self-pay | Admitting: Cardiovascular Disease

## 2021-03-28 DIAGNOSIS — E039 Hypothyroidism, unspecified: Secondary | ICD-10-CM

## 2021-03-30 LAB — CULTURE, BLOOD (ROUTINE X 2)
Culture: NO GROWTH
Culture: NO GROWTH
Special Requests: ADEQUATE

## 2021-03-31 ENCOUNTER — Telehealth: Payer: Self-pay | Admitting: Internal Medicine

## 2021-03-31 NOTE — Telephone Encounter (Signed)
Patient's daughter called and her father (patient) is still having issues swallowing.  The other day when you scheduled him an appointment with Jaclyn Shaggy, they ended up having to take him to the ER and they didn't get home that next morning until 1 a.m.  Daughter still feels like he needs to come in to see someone here.  Can you work patient into another sooner appointment?  Please call daughter and advise.  Thank you.

## 2021-03-31 NOTE — Telephone Encounter (Signed)
Pt daughter Knute Neu states that Leonce has been having difficulty  swallowing. [name] requesting soonest appointment. Appointment with Dr. Carlean Purl Scheduled on 04/04/2021 at 8:50 AM. [name] made aware.  Pt verbalized understanding with all questions answered.

## 2021-04-04 ENCOUNTER — Encounter (HOSPITAL_COMMUNITY): Payer: Self-pay | Admitting: Emergency Medicine

## 2021-04-04 ENCOUNTER — Encounter: Payer: Self-pay | Admitting: Internal Medicine

## 2021-04-04 ENCOUNTER — Inpatient Hospital Stay (HOSPITAL_COMMUNITY)
Admission: EM | Admit: 2021-04-04 | Discharge: 2021-04-09 | DRG: 392 | Disposition: A | Discharge status: Home Health | Payer: Medicare Other | Source: Ambulatory Visit | Attending: Internal Medicine | Admitting: Internal Medicine

## 2021-04-04 ENCOUNTER — Ambulatory Visit: Payer: Medicare Other | Admitting: Internal Medicine

## 2021-04-04 ENCOUNTER — Other Ambulatory Visit: Payer: Self-pay

## 2021-04-04 ENCOUNTER — Emergency Department (HOSPITAL_COMMUNITY): Payer: Medicare Other

## 2021-04-04 VITALS — BP 110/70 | HR 60 | Ht 66.0 in | Wt 174.0 lb

## 2021-04-04 DIAGNOSIS — Z823 Family history of stroke: Secondary | ICD-10-CM

## 2021-04-04 DIAGNOSIS — I255 Ischemic cardiomyopathy: Secondary | ICD-10-CM | POA: Diagnosis present

## 2021-04-04 DIAGNOSIS — R7401 Elevation of levels of liver transaminase levels: Secondary | ICD-10-CM | POA: Diagnosis not present

## 2021-04-04 DIAGNOSIS — D649 Anemia, unspecified: Secondary | ICD-10-CM | POA: Diagnosis not present

## 2021-04-04 DIAGNOSIS — K828 Other specified diseases of gallbladder: Secondary | ICD-10-CM | POA: Diagnosis not present

## 2021-04-04 DIAGNOSIS — I959 Hypotension, unspecified: Secondary | ICD-10-CM | POA: Diagnosis not present

## 2021-04-04 DIAGNOSIS — I495 Sick sinus syndrome: Secondary | ICD-10-CM | POA: Diagnosis not present

## 2021-04-04 DIAGNOSIS — K219 Gastro-esophageal reflux disease without esophagitis: Secondary | ICD-10-CM | POA: Diagnosis not present

## 2021-04-04 DIAGNOSIS — Z7901 Long term (current) use of anticoagulants: Secondary | ICD-10-CM | POA: Diagnosis not present

## 2021-04-04 DIAGNOSIS — R634 Abnormal weight loss: Secondary | ICD-10-CM

## 2021-04-04 DIAGNOSIS — Z8249 Family history of ischemic heart disease and other diseases of the circulatory system: Secondary | ICD-10-CM

## 2021-04-04 DIAGNOSIS — I5032 Chronic diastolic (congestive) heart failure: Secondary | ICD-10-CM | POA: Diagnosis present

## 2021-04-04 DIAGNOSIS — I480 Paroxysmal atrial fibrillation: Secondary | ICD-10-CM | POA: Diagnosis not present

## 2021-04-04 DIAGNOSIS — N1831 Chronic kidney disease, stage 3a: Secondary | ICD-10-CM | POA: Diagnosis present

## 2021-04-04 DIAGNOSIS — E785 Hyperlipidemia, unspecified: Secondary | ICD-10-CM | POA: Diagnosis present

## 2021-04-04 DIAGNOSIS — I5042 Chronic combined systolic (congestive) and diastolic (congestive) heart failure: Secondary | ICD-10-CM | POA: Diagnosis not present

## 2021-04-04 DIAGNOSIS — Z8673 Personal history of transient ischemic attack (TIA), and cerebral infarction without residual deficits: Secondary | ICD-10-CM

## 2021-04-04 DIAGNOSIS — E039 Hypothyroidism, unspecified: Secondary | ICD-10-CM | POA: Diagnosis present

## 2021-04-04 DIAGNOSIS — Z20822 Contact with and (suspected) exposure to covid-19: Secondary | ICD-10-CM | POA: Diagnosis present

## 2021-04-04 DIAGNOSIS — R7989 Other specified abnormal findings of blood chemistry: Secondary | ICD-10-CM

## 2021-04-04 DIAGNOSIS — K3189 Other diseases of stomach and duodenum: Secondary | ICD-10-CM | POA: Diagnosis present

## 2021-04-04 DIAGNOSIS — M1990 Unspecified osteoarthritis, unspecified site: Secondary | ICD-10-CM | POA: Diagnosis not present

## 2021-04-04 DIAGNOSIS — I517 Cardiomegaly: Secondary | ICD-10-CM | POA: Diagnosis not present

## 2021-04-04 DIAGNOSIS — J189 Pneumonia, unspecified organism: Secondary | ICD-10-CM

## 2021-04-04 DIAGNOSIS — Z8616 Personal history of COVID-19: Secondary | ICD-10-CM | POA: Diagnosis not present

## 2021-04-04 DIAGNOSIS — R627 Adult failure to thrive: Secondary | ICD-10-CM | POA: Diagnosis not present

## 2021-04-04 DIAGNOSIS — G2581 Restless legs syndrome: Secondary | ICD-10-CM | POA: Diagnosis present

## 2021-04-04 DIAGNOSIS — Z951 Presence of aortocoronary bypass graft: Secondary | ICD-10-CM

## 2021-04-04 DIAGNOSIS — E782 Mixed hyperlipidemia: Secondary | ICD-10-CM | POA: Diagnosis not present

## 2021-04-04 DIAGNOSIS — Z720 Tobacco use: Secondary | ICD-10-CM | POA: Diagnosis not present

## 2021-04-04 DIAGNOSIS — I2510 Atherosclerotic heart disease of native coronary artery without angina pectoris: Secondary | ICD-10-CM | POA: Diagnosis present

## 2021-04-04 DIAGNOSIS — I482 Chronic atrial fibrillation, unspecified: Secondary | ICD-10-CM | POA: Diagnosis not present

## 2021-04-04 DIAGNOSIS — I110 Hypertensive heart disease with heart failure: Secondary | ICD-10-CM | POA: Diagnosis not present

## 2021-04-04 DIAGNOSIS — R1319 Other dysphagia: Secondary | ICD-10-CM | POA: Diagnosis not present

## 2021-04-04 DIAGNOSIS — Z87440 Personal history of urinary (tract) infections: Secondary | ICD-10-CM

## 2021-04-04 DIAGNOSIS — I10 Essential (primary) hypertension: Secondary | ICD-10-CM | POA: Diagnosis not present

## 2021-04-04 DIAGNOSIS — R1310 Dysphagia, unspecified: Secondary | ICD-10-CM

## 2021-04-04 DIAGNOSIS — I509 Heart failure, unspecified: Secondary | ICD-10-CM | POA: Diagnosis not present

## 2021-04-04 DIAGNOSIS — I130 Hypertensive heart and chronic kidney disease with heart failure and stage 1 through stage 4 chronic kidney disease, or unspecified chronic kidney disease: Secondary | ICD-10-CM | POA: Diagnosis present

## 2021-04-04 DIAGNOSIS — Z79899 Other long term (current) drug therapy: Secondary | ICD-10-CM

## 2021-04-04 DIAGNOSIS — K7469 Other cirrhosis of liver: Secondary | ICD-10-CM | POA: Diagnosis present

## 2021-04-04 DIAGNOSIS — K7460 Unspecified cirrhosis of liver: Secondary | ICD-10-CM | POA: Diagnosis not present

## 2021-04-04 DIAGNOSIS — K222 Esophageal obstruction: Principal | ICD-10-CM | POA: Diagnosis present

## 2021-04-04 DIAGNOSIS — Z79890 Hormone replacement therapy (postmenopausal): Secondary | ICD-10-CM

## 2021-04-04 DIAGNOSIS — Z885 Allergy status to narcotic agent status: Secondary | ICD-10-CM

## 2021-04-04 DIAGNOSIS — E860 Dehydration: Secondary | ICD-10-CM | POA: Diagnosis present

## 2021-04-04 DIAGNOSIS — E7800 Pure hypercholesterolemia, unspecified: Secondary | ICD-10-CM | POA: Diagnosis not present

## 2021-04-04 DIAGNOSIS — N179 Acute kidney failure, unspecified: Secondary | ICD-10-CM | POA: Diagnosis not present

## 2021-04-04 DIAGNOSIS — R748 Abnormal levels of other serum enzymes: Secondary | ICD-10-CM | POA: Diagnosis present

## 2021-04-04 DIAGNOSIS — Z87442 Personal history of urinary calculi: Secondary | ICD-10-CM

## 2021-04-04 DIAGNOSIS — Z9081 Acquired absence of spleen: Secondary | ICD-10-CM

## 2021-04-04 DIAGNOSIS — E038 Other specified hypothyroidism: Secondary | ICD-10-CM | POA: Diagnosis not present

## 2021-04-04 LAB — COMPREHENSIVE METABOLIC PANEL
ALT: 79 U/L — ABNORMAL HIGH (ref 0–44)
AST: 178 U/L — ABNORMAL HIGH (ref 15–41)
Albumin: 1.9 g/dL — ABNORMAL LOW (ref 3.5–5.0)
Alkaline Phosphatase: 115 U/L (ref 38–126)
Anion gap: 9 (ref 5–15)
BUN: 55 mg/dL — ABNORMAL HIGH (ref 8–23)
CO2: 28 mmol/L (ref 22–32)
Calcium: 8.1 mg/dL — ABNORMAL LOW (ref 8.9–10.3)
Chloride: 96 mmol/L — ABNORMAL LOW (ref 98–111)
Creatinine, Ser: 2.52 mg/dL — ABNORMAL HIGH (ref 0.61–1.24)
GFR, Estimated: 24 mL/min — ABNORMAL LOW (ref >60)
Glucose, Bld: 108 mg/dL — ABNORMAL HIGH (ref 70–99)
Potassium: 4.2 mmol/L (ref 3.5–5.1)
Sodium: 133 mmol/L — ABNORMAL LOW (ref 135–145)
Total Bilirubin: 0.5 mg/dL (ref 0.3–1.2)
Total Protein: 7.2 g/dL (ref 6.5–8.1)

## 2021-04-04 LAB — CBC WITH DIFFERENTIAL/PLATELET
Abs Immature Granulocytes: 0.04 10*3/uL (ref 0.00–0.07)
Basophils Absolute: 0 10*3/uL (ref 0.0–0.1)
Basophils Relative: 0 %
Eosinophils Absolute: 0.1 10*3/uL (ref 0.0–0.5)
Eosinophils Relative: 1 %
HCT: 32.7 % — ABNORMAL LOW (ref 39.0–52.0)
Hemoglobin: 10.5 g/dL — ABNORMAL LOW (ref 13.0–17.0)
Immature Granulocytes: 0 %
Lymphocytes Relative: 14 %
Lymphs Abs: 1.3 10*3/uL (ref 0.7–4.0)
MCH: 31.1 pg (ref 26.0–34.0)
MCHC: 32.1 g/dL (ref 30.0–36.0)
MCV: 96.7 fL (ref 80.0–100.0)
Monocytes Absolute: 0.7 10*3/uL (ref 0.1–1.0)
Monocytes Relative: 8 %
Neutro Abs: 6.9 10*3/uL (ref 1.7–7.7)
Neutrophils Relative %: 77 %
Platelets: 275 10*3/uL (ref 150–400)
RBC: 3.38 MIL/uL — ABNORMAL LOW (ref 4.22–5.81)
RDW: 13.8 % (ref 11.5–15.5)
WBC: 9.1 10*3/uL (ref 4.0–10.5)
nRBC: 0 % (ref 0.0–0.2)

## 2021-04-04 LAB — HEPARIN LEVEL (UNFRACTIONATED): Heparin Unfractionated: >1.1 IU/mL — ABNORMAL HIGH (ref 0.30–0.70)

## 2021-04-04 LAB — APTT: aPTT: 43 seconds — ABNORMAL HIGH (ref 24–36)

## 2021-04-04 MED ORDER — ROPINIROLE HCL 0.25 MG PO TABS
0.2500 mg | ORAL_TABLET | Freq: Two times a day (BID) | ORAL | Status: DC
  Administered 2021-04-04 – 2021-04-09 (×9): 0.25 mg via ORAL
  Filled 2021-04-04 (×10): qty 1

## 2021-04-04 MED ORDER — PANTOPRAZOLE SODIUM 40 MG IV SOLR
40.0000 mg | Freq: Two times a day (BID) | INTRAVENOUS | Status: DC
  Administered 2021-04-04 – 2021-04-08 (×9): 40 mg via INTRAVENOUS
  Filled 2021-04-04 (×9): qty 40

## 2021-04-04 MED ORDER — LEVOTHYROXINE SODIUM 25 MCG PO TABS
25.0000 ug | ORAL_TABLET | Freq: Every day | ORAL | Status: DC
  Administered 2021-04-05 – 2021-04-09 (×5): 25 ug via ORAL
  Filled 2021-04-04 (×5): qty 1

## 2021-04-04 MED ORDER — TRAMADOL HCL 50 MG PO TABS
50.0000 mg | ORAL_TABLET | Freq: Once | ORAL | Status: AC
  Administered 2021-04-05: 50 mg via ORAL
  Filled 2021-04-04: qty 1

## 2021-04-04 MED ORDER — HEPARIN (PORCINE) 25000 UT/250ML-% IV SOLN
1100.0000 [IU]/h | INTRAVENOUS | Status: AC
  Administered 2021-04-04 – 2021-04-05 (×2): 1200 [IU]/h via INTRAVENOUS
  Administered 2021-04-06: 1100 [IU]/h via INTRAVENOUS
  Filled 2021-04-04 (×3): qty 250

## 2021-04-04 MED ORDER — SODIUM CHLORIDE 0.9 % IV SOLN: INTRAVENOUS | Status: DC

## 2021-04-04 MED ORDER — SODIUM CHLORIDE 0.9 % IV BOLUS
1000.0000 mL | Freq: Once | INTRAVENOUS | Status: AC
  Administered 2021-04-04: 1000 mL via INTRAVENOUS

## 2021-04-04 MED ORDER — MELATONIN 3 MG PO TABS
3.0000 mg | ORAL_TABLET | Freq: Every day | ORAL | Status: DC
  Administered 2021-04-04 – 2021-04-08 (×4): 3 mg via ORAL
  Filled 2021-04-04 (×4): qty 1

## 2021-04-04 NOTE — H&P (Signed)
History and Physical    Gregory [name] KNL:[id number] DOB: 04-20-1932 DOA: 04/04/2021  PCP: Darreld Mclean, MD  Patient coming from: Home  Chief Complaint: weakness, dysphagia  HPI: Gregory [name] is a 88 y.o. male with medical history significant of a fib, RLS, HLD, HTN, CAD, chronic HFrEF. Presenting with dysphagia. He has had swallowing issue for several months. At first it was just with solids; but now it has progressed to liquids as well. He is still able to take his pills. Understandably during this time, he has progressively felt weaker and has had increased difficulty with mobility. He was seen in GI clinic this morning and it was recommended that he come to the ED for evaluation.   ED Course: [facility] was consulted. He was given fluids. [facility] was called for admission.   Review of Systems:  Denies CP, palpitations, dyspnea, abdominal pain, N/V/D, fevers, sick contacts. Review of systems is otherwise negative for all not mentioned in HPI.   PMHx Past Medical History:  Diagnosis Date   Acute respiratory failure ([place])    Appendicitis with abscess 03/31/2011   Arthritis    CAD (coronary artery disease),CABG 1993-LIMA to the LAD, SVG to Om and SVG to PDA/PLA, patent on cath 2008. 2003   a. s/p CABG 2003. b. last cath 2008 with patent grafts.   Chronic atrial fibrillation (HCC)    permanent   Chronic systolic CHF (congestive heart failure) ([place])    a. Prior low EF, later normalized.   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 07/2015 - after holding Xarelto x 3 days for spinal procedure.   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 felt vasovagal in setting of pain from renal infarct.    PSHx Past Surgical History:  Procedure Laterality Date   BALLOON DILATION N/A 05/22/2015   Procedure: BALLOON DILATION;  Surgeon: Gatha Mayer, MD;  Location: [facility] ENDOSCOPY;  Service: Endoscopy;  Laterality: N/A;   BALLOON DILATION N/A 12/15/2018   Procedure: BALLOON DILATION;  Surgeon: Gatha Mayer, MD;  Location: [facility] ENDOS[facility];  Service: Endoscopy;  Laterality: N/A;   CARDIAC CATHETERIZATION  02/24/2002   reduced LV function, 60-70% prox RCA stenosis, 70% PLA ostial stenosis, 80% secondary branch of PLA stenosis - subsequent CABG (Dr. Jackie Plum)   Carotid Doppler  06/2012   50-69% right bulb/prox ICA diameter reduction; 70-99% left bulb/prox ICA diameter reduction   CATARACT EXTRACTION W/ INTRAOCULAR LENS IMPLANT Bilateral 2013   COLONOSCOPY N/A 05/18/2016   Procedure: COLONOSCOPY;  Surgeon: Manus Gunning, MD;  Location: [facility] ENDOSCOPY;  Service: Gastroenterology;  Laterality: N/A;   CORONARY ANGIOPLASTY  07/05/2006   3 vessel CAD, patent LIMA to LAD, patentVG to OM, patent SVG to PDA & PLA, mild MR, severe LV systolic dysfunction (Dr. Gerrie Nordmann)   [place] GRAFT  03/01/2002   LIMA to LAD, reverse SVG to OM, reverse SVG to PDA of RCA, reverse SVG to PLA of RCA, ligation of LA appendage (Dr. Servando Snare)  ENTEROSCOPY N/A 05/20/2016   Procedure: ENTEROSCOPY;  Surgeon: Manus Gunning, MD;  Location: [place];  Service: Gastroenterology;  Laterality: N/A;   ESOPHAGOGASTRODUODENOSCOPY  02/01/2012   Procedure: ESOPHAGOGASTRODUODENOSCOPY (EGD);  Surgeon: Beryle Beams, MD;  Location: [facility];  Service: Endoscopy;  Laterality: N/A;   ESOPHAGOGASTRODUODENOSCOPY N/A 05/22/2015   Procedure: ESOPHAGOGASTRODUODENOSCOPY (EGD);  Surgeon: Gatha Mayer, MD;  Location: [facility] ENDOSCOPY;  Service: Endoscopy;  Laterality: N/A;   ESOPHAGOGASTRODUODENOSCOPY N/A 05/17/2016   Procedure:  ESOPHAGOGASTRODUODENOSCOPY (EGD);  Surgeon: Juanita Craver, MD;  Location: [facility] ENDOSCOPY;  Service: Endoscopy;  Laterality: N/A;   ESOPHAGOGASTRODUODENOSCOPY N/A 09/17/2017   Procedure: ESOPHAGOGASTRODUODENOSCOPY (EGD);  Surgeon: Milus Banister, MD;  Location: [facility] ENDOSCOPY;  Service: Endoscopy;  Laterality: N/A;   ESOPHAGOGASTRODUODENOSCOPY Left 02/09/2020   Procedure: ESOPHAGOGASTRODUODENOSCOPY (EGD);  Surgeon: Lavena Bullion, DO;  Location: [facility] ENDOSCOPY;  Service: Gastroenterology;  Laterality: Left;   ESOPHAGOGASTRODUODENOSCOPY (EGD) WITH PROPOFOL N/A 12/15/2018   Procedure: ESOPHAGOGASTRODUODENOSCOPY (EGD) WITH PROPOFOL;  Surgeon: Gatha Mayer, MD;  [facility]OS[facility];  Service: Endoscopy;  Laterality: N/A;   FOREIGN BODY REMOVAL  09/17/2017   Procedure: FOREIGN BODY REMOVAL;  Surgeon: Milus Banister, MD;  Location: [facility] ENDOSCOPY;  Service: Endoscopy;;   FOREIGN BODY REMOVAL  12/15/2018   Procedure: FOREIGN BODY REMOVAL;  Surgeon: Gatha Mayer, MD;  [facility];  Service: Endoscopy;;   FOREIGN BODY REMOVAL  02/09/2020   Procedure: FOREIGN BODY REMOVAL;  Surgeon: Lavena Bullion, DO;  Location: [place];  Service: Gastroenterology;;   GIVENS CAPSULE STUDY N/A 05/18/2016   Procedure: GIVENS CAPSULE STUDY;  Surgeon: Manus Gunning, MD;  Location: [place];  Service: Gastroenterology;  Laterality: N/A;   LAPAROSCOPIC APPENDECTOMY  03/30/2011   Procedure: APPENDECTOMY LAPAROSCOPIC;  Surgeon: Joyice Faster. Cornett, MD;  Location: [place];  Service: General;  Laterality: N/A;   LEFT HEART CATHETERIZATION WITH CORONARY ANGIOGRAM N/A 01/24/2013   Procedure: LEFT HEART CATHETERIZATION WITH CORONARY ANGIOGRAM;  Surgeon: [name], MD;  Location: [facility] CATH LAB;  Service: Cardiovascular;  Laterality: N/A;  Right heart with grafts   LITHOTRIPSY     "once" (01/21/2013)   [place]  05/2009   persantine myoview - fixed moderate perfusion defect in inferior  wall & lateral segment of apex (poor non-transmural infarction), minimal anterolateral periinfarct reversible ischemia seen, abnormal study, defects similar to 2006 study   RIGHT/LEFT HEART CATH AND CORONARY ANGIOGRAPHY N/A 09/17/2017   Procedure: RIGHT/LEFT HEART CATH AND CORONARY ANGIOGRAPHY;  Surgeon: Martinique, Peter M, MD;  Location: [place] CV LAB;  Service: Cardiovascular;  Laterality: N/A;   SPLENECTOMY, TOTAL  01/2013   TEE WITHOUT CARDIOVERSION N/A 01/23/2013   Procedure: TRANSESOPHAGEAL ECHOCARDIOGRAM (TEE);  Surgeon: Sanda Klein, MD;  Location: [facility] ENDOSCOPY;  Service: Cardiovascular;  Laterality: N/A;   TRANSTHORACIC ECHOCARDIOGRAM  06/23/2012   EF 40-45%, mild LVH; mild AV regurg; mild MV regurg; LV mod-severely dilated; RV mildly dilated; systolic pressure borderline increased; RA mod dilated    SocHx  reports that he quit smoking about 35 years ago. His smoking use included cigarettes. He has a 40.00 pack-year smoking history. His smokeless tobacco use includes chew. He reports that he does not drink alcohol and does not use drugs.  Allergies  Allergen Reactions   Percocet [Oxycodone-Acetaminophen] Itching and Nausea Only    FamHx Family History  Problem Relation Age of Onset   Heart disease Father        rheumatic fever   Heart  attack Brother 65   Stroke Mother    Stroke Brother 106   Stomach cancer Neg Hx    Rectal cancer Neg Hx    Prostate cancer Neg Hx    Pancreatic cancer Neg Hx    Ovarian cancer Neg Hx     Prior to Admission medications   Medication Sig Start Date End Date Taking? Authorizing Provider  acetaminophen (TYLENOL) 500 MG tablet Take 500 mg by mouth every 6 (six) hours as needed for moderate pain.   Yes [provider]  apixaban (ELIQUIS) 2.5 MG TABS tablet TAKE 1 TABLET(2.5 MG) BY MOUTH TWICE DAILY 12/16/20  Yes [name], [name], MD  carvedilol (COREG) 3.125 MG tablet Take 1 tablet (3.125 mg total) by mouth 2 (two) times daily with a  meal. TAKE 1 TABLET(3.125 MG) BY MOUTH TWICE DAILY WITH A MEAL 01/24/21  Yes Troy Sine, MD  ezetimibe (ZETIA) 10 MG tablet TAKE 1 TABLET(10 MG) BY MOUTH DAILY 03/24/21  Yes Troy Sine, MD  levothyroxine (SYNTHROID) 25 MCG tablet TAKE 1 TABLET(25 MCG) BY MOUTH DAILY BEFORE BREAKFAST 03/31/21  Yes Troy Sine, MD  lisinopril (ZESTRIL) 5 MG tablet TAKE 1 TABLET(5 MG) BY MOUTH DAILY 11/15/20  Yes Mosie Lukes, MD  pantoprazole (PROTONIX) 40 MG tablet TAKE 1 TABLET(40 MG) BY MOUTH TWICE DAILY 01/28/21  Yes Troy Sine, MD  rOPINIRole (REQUIP) 0.25 MG tablet TAKE 1 TABLET(0.25 MG) BY MOUTH TWICE DAILY 02/13/21  Yes [name], [name], MD  rosuvastatin (CRESTOR) 40 MG tablet TAKE 1 TABLET(40 MG) BY MOUTH DAILY 08/19/20  Yes Troy Sine, MD  tiZANidine (ZANAFLEX) 4 MG tablet TAKE 1 TABLET(4 MG) BY MOUTH EVERY 8 HOURS AS NEEDED FOR MUSCLE SPASMS. DO NOT COMBINE WITH TRAMADOL 11/27/20  Yes [name], [name], MD  furosemide (LASIX) 40 MG tablet Take 20 mg by mouth. Patient not taking: Reported on 04/04/2021    [provider]  traMADol (ULTRAM) 50 MG tablet Take 1 tablet (50 mg total) by mouth every 6 (six) hours as needed. 03/25/21   Malvin Johns, MD    Physical Exam: Vitals:   04/04/21 1156 04/04/21 1157 04/04/21 1200 04/04/21 1205  BP: (!) 102/53  (!) 96/47 (!) 93/50  Pulse: 61 66 (!) 57 (!) 58  Resp: (!) 22 15 16 18   Temp:      TempSrc:      SpO2: 99% 98% 97% 99%  Weight:      Height:        General: 88 y.o. male resting in bed in NAD Eyes: PERRL, normal sclera ENMT: Nares patent w/o discharge, orophaynx clear, dentition normal, ears w/o discharge/lesions/ulcers Neck: Supple, trachea midline Cardiovascular: RRR, +S1, S2, no m/g/r, equal pulses throughout Respiratory: CTABL, no w/r/r, normal WOB GI: BS+, NDNT, no masses noted, no organomegaly noted MSK: No e/c/c Neuro: A&O x 3, no focal deficits Psyc: Appropriate interaction and affect, calm/cooperative  Labs  on Admission: I have personally reviewed following labs and imaging studies  CBC: Recent Labs  Lab 04/04/21 1026  WBC 9.1  NEUTROABS 6.9  HGB 10.5*  HCT 32.7*  MCV 96.7  PLT 998   Basic Metabolic Panel: Recent Labs  Lab 04/04/21 1026  NA 133*  K 4.2  CL 96*  CO2 28  GLUCOSE 108*  BUN 55*  CREATININE 2.52*  CALCIUM 8.1*   GFR: Estimated Creatinine Clearance: 20.8 mL/min (A) (by C-G formula based on SCr of 2.52 mg/dL (H)). Liver Function Tests: Recent Labs  Lab 04/04/21 1026  AST 178*  ALT 79*  ALKPHOS 115  BILITOT 0.5  PROT 7.2  ALBUMIN 1.9*   No results for input(s): LIPASE, AMYLASE in the last 168 hours. No results for input(s): AMMONIA in the last 168 hours. Coagulation Profile: No results for input(s): INR, PROTIME in the last 168 hours. Cardiac Enzymes: No results for input(s): CKTOTAL, CKMB, CKMBINDEX, TROPONINI in the last 168 hours. BNP (last 3 results) No results for input(s): PROBNP in the last 8760 hours. HbA1C: No results for input(s): HGBA1C in the last 72 hours. CBG: No results for input(s): GLUCAP in the last 168 hours. Lipid Profile: No results for input(s): CHOL, HDL, LDLCALC, TRIG, CHOLHDL, LDLDIRECT in the last 72 hours. Thyroid Function Tests: No results for input(s): TSH, T4TOTAL, FREET4, T3FREE, THYROIDAB in the last 72 hours. Anemia Panel: No results for input(s): VITAMINB12, FOLATE, FERRITIN, TIBC, IRON, RETICCTPCT in the last 72 hours. Urine analysis:    Component Value Date/Time   COLORURINE YELLOW 03/25/2021 1918   APPEARANCEUR CLEAR 03/25/2021 1918   LABSPEC 1.011 03/25/2021 1918   PHURINE 8.0 03/25/2021 1918   GLUCOSEU NEGATIVE 03/25/2021 1918   HGBUR NEGATIVE 03/25/2021 1918   BILIRUBINUR NEGATIVE 03/25/2021 1918   BILIRUBINUR negative 03/20/2021 1346   BILIRUBINUR negative 01/20/2013 1623   KETONESUR NEGATIVE 03/25/2021 1918   PROTEINUR 30 (A) 03/25/2021 1918   UROBILINOGEN 0.2 03/20/2021 1346   UROBILINOGEN 1.0  03/30/2011 2224   NITRITE NEGATIVE 03/25/2021 1918   LEUKOCYTESUR NEGATIVE 03/25/2021 1918    Radiological Exams on Admission: DG Chest Port 1 View  Result Date: 04/04/2021 CLINICAL DATA:  Dysphagia EXAM: PORTABLE CHEST 1 VIEW COMPARISON:  Chest x-ray 03/25/2021 FINDINGS: Heart is enlarged. Mediastinum appears stable. Calcified plaques in the aortic arch. Cardiac surgical changes and median sternotomy wires. Pulmonary vasculature is within normal limits. No focal consolidation identified. No pleural effusion or pneumothorax. IMPRESSION: Cardiomegaly with no acute process identified. Electronically Signed   By: Ofilia Neas M.D.   On: 04/04/2021 10:49    EKG: None obtained in ED  Assessment/Plan Dysphagia Poor PO intake     - place in obs, tele     - [facility] onboard, appreciate assistance; will defer imaging before EGD to them     - NPO except for sips w/ meds; NPOpMN for EGD tomorrow     - IVF  AKI on CKD3a Dehydration     - likely secondary to poor PO intake     - check renal US     - IVF, follow  Normocytic anemia     - no evidence of bleed     - check iron studies     - he's on eliquis for a fib; converting for heparin gtt til his procedure  Elevated LFTs     - check hepatitis panel and RUQ [place]     - hold statin  GERD     - PPI  A fib     - on eliquis, coreg; holding both     - start heparin gtt  HTN     - hold home regimen as he is hypotensive at this time  HLD     - holding home regimen d/t elevated LFTs  Hypothyroidism     - resume home regimen  Restless leg syndrome     - continue home regimen  Recent COVID 19 positive test     - no symptoms; he was positive in Dec 22 and earlier this month     -  no need for isolation at the time  CAD Chronic HFrEF     - continue holding statin d/t elevated LFTs, holding lasix d/t hypotension  FTT Generalized weakness     - secondary to poor PO intake     - will have PT/OT eval after scope; may need placement      - also get dietician eval after procedure  DVT prophylaxis: heparin gtt  Code Status: FULL  Family Communication: w/ dtr at bedside Consults called: [facility]   Status is: Observation  The patient remains OBS appropriate and will d/c before 2 midnights.   Jonnie Finner DO [facility]  If 7PM-7AM, please contact night-coverage [email or web address]  04/04/2021, 12:12 PM

## 2021-04-04 NOTE — ED Provider Notes (Signed)
[facility] DEPT Provider Note   CSN: [id number] Arrival date & time: 04/04/21  4193     History  Chief Complaint  Patient presents with   Dysphagia   Abdominal Pain   Nausea    Gregory [name] is a 88 y.o. male.  HPI Patient presents from our GI clinic for request of admission. He is here with his daughter who assists with history.  Patient has new difficulty with [name], both liquids and solids over the past 3 weeks, substantially worsened over the past 3 days.  He has pain in his sternum, epigastrium, worse with attempted p.o. intake.  Seemingly no other chest pain, no other abdominal pain.,  No syncope, no fever, no nausea otherwise. Patient does have notable history of COVID in the past few months.  He is also on Eliquis for history of A. fib.  Last dose of this medication was yesterday.  On reviewing his GI notes from today, patient was seen, evaluated, plan for endoscopy, this cannot happen due to his Eliquis use until tomorrow.    Home Medications Prior to Admission medications   Medication Sig Start Date End Date Taking? Authorizing Provider  acetaminophen (TYLENOL) 500 MG tablet Take 500 mg by mouth every 6 (six) hours as needed for moderate pain.   Yes [provider]  apixaban (ELIQUIS) 2.5 MG TABS tablet TAKE 1 TABLET(2.5 MG) BY MOUTH TWICE DAILY 12/16/20  Yes [name], [name], MD  carvedilol (COREG) 3.125 MG tablet Take 1 tablet (3.125 mg total) by mouth 2 (two) times daily with a meal. TAKE 1 TABLET(3.125 MG) BY MOUTH TWICE DAILY WITH A MEAL 01/24/21  Yes Troy Sine, MD  ezetimibe (ZETIA) 10 MG tablet TAKE 1 TABLET(10 MG) BY MOUTH DAILY 03/24/21  Yes Troy Sine, MD  levothyroxine (SYNTHROID) 25 MCG tablet TAKE 1 TABLET(25 MCG) BY MOUTH DAILY BEFORE BREAKFAST 03/31/21  Yes Troy Sine, MD  lisinopril (ZESTRIL) 5 MG tablet TAKE 1 TABLET(5 MG) BY MOUTH DAILY 11/15/20  Yes Mosie Lukes, MD  pantoprazole (PROTONIX) 40  MG tablet TAKE 1 TABLET(40 MG) BY MOUTH TWICE DAILY 01/28/21  Yes Troy Sine, MD  rOPINIRole (REQUIP) 0.25 MG tablet TAKE 1 TABLET(0.25 MG) BY MOUTH TWICE DAILY 02/13/21  Yes [name], [name], MD  rosuvastatin (CRESTOR) 40 MG tablet TAKE 1 TABLET(40 MG) BY MOUTH DAILY 08/19/20  Yes Troy Sine, MD  tiZANidine (ZANAFLEX) 4 MG tablet TAKE 1 TABLET(4 MG) BY MOUTH EVERY 8 HOURS AS NEEDED FOR MUSCLE SPASMS. DO NOT COMBINE WITH TRAMADOL 11/27/20  Yes [name], [name], MD  furosemide (LASIX) 40 MG tablet Take 20 mg by mouth. Patient not taking: Reported on 04/04/2021    [provider]  traMADol (ULTRAM) 50 MG tablet Take 1 tablet (50 mg total) by mouth every 6 (six) hours as needed. 03/25/21   Malvin Johns, MD      Allergies    Percocet [oxycodone-acetaminophen]    Review of Systems   Review of Systems  Constitutional:        Per HPI, otherwise negative  HENT:         Per HPI, otherwise negative  Respiratory:         Per HPI, otherwise negative  Cardiovascular:        Per HPI, otherwise negative  Gastrointestinal:  Negative for vomiting.  Endocrine:       Negative aside from HPI  Genitourinary:        Neg  aside from HPI   Musculoskeletal:        Per HPI, otherwise negative  Skin: Negative.   Neurological:  Negative for syncope.   Physical Exam Updated Vital Signs BP (!) 87/48 (BP Location: Left Arm)    Pulse 63    Temp (!) 97.4 F (36.3 C) (Oral)    Resp 16    Ht 5\' 6"  (1.676 m)    Wt 79 kg    SpO2 98%    BMI 28.11 kg/m  Physical Exam Vitals and nursing note reviewed.  Constitutional:      General: He is not in acute distress.    Appearance: He is well-developed.  HENT:     Head: Normocephalic and atraumatic.  Eyes:     Conjunctiva/sclera: Conjunctivae normal.  Cardiovascular:     Rate and Rhythm: Normal rate and regular rhythm.  Pulmonary:     Effort: Pulmonary effort is normal. No respiratory distress.     Breath sounds: No stridor.  Abdominal:      General: There is no distension.     Tenderness: There is no abdominal tenderness.  Skin:    General: Skin is warm and dry.  Neurological:     Mental Status: He is alert and oriented to person, place, and time.    ED Results / Procedures / Treatments   Labs (all labs ordered are listed, but only abnormal results are displayed) Labs Reviewed  CBC WITH DIFFERENTIAL/PLATELET - Abnormal; Notable for the following components:      Result Value   RBC 3.38 (*)    Hemoglobin 10.5 (*)    HCT 32.7 (*)    All other components within normal limits  COMPREHENSIVE METABOLIC PANEL - Abnormal; Notable for the following components:   Sodium 133 (*)    Chloride 96 (*)    Glucose, Bld 108 (*)    BUN 55 (*)    Creatinine, Ser 2.52 (*)    Calcium 8.1 (*)    Albumin 1.9 (*)    AST 178 (*)    ALT 79 (*)    GFR, Estimated 24 (*)    All other components within normal limits    EKG None  Radiology DG Chest Port 1 View  Result Date: 04/04/2021 CLINICAL DATA:  Dysphagia EXAM: PORTABLE CHEST 1 VIEW COMPARISON:  Chest x-ray 03/25/2021 FINDINGS: Heart is enlarged. Mediastinum appears stable. Calcified plaques in the aortic arch. Cardiac surgical changes and median sternotomy wires. Pulmonary vasculature is within normal limits. No focal consolidation identified. No pleural effusion or pneumothorax. IMPRESSION: Cardiomegaly with no acute process identified. Electronically Signed   By: Ofilia Neas M.D.   On: 04/04/2021 10:49    Procedures Procedures    Medications Ordered in ED Medications - No data to display  ED Course/ Medical Decision Making/ A&P Cardiac monitor rate 70s sinus unremarkable Pulse ox 100% room air normal  Differential including mass versus esophageal stricture versus infection versus bacteremia or sepsis considered.  Labs, x-ray ordered, chart review performed.                         Medical Decision Making Adult male presents with worsening weakness, increasing  dysphagia, is awake, alert, afebrile.  ED findings notable for generally reassuring lab results, x-ray which I interpreted, negative for pneumonia, negative for pneumoperitoneum, pneumothorax.  I discussed case with our GI colleagues via messaging, internal medicine colleagues, patient required admission for monitoring, management.  Amount and/or Complexity of  Data Reviewed Independent Historian: caregiver External Data Reviewed: radiology and notes.    Details: GI clinic visit from earlier in the day reviewed, suggestion for admission for endoscopy Labs: ordered. Decision-making details documented in ED Course. Radiology: ordered. Decision-making details documented in ED Course. ECG/medicine tests:  Decision-making details documented in ED Course.  Risk Decision regarding hospitalization. Risk Details: Admission for endoscopy        Final Clinical Impression(s) / ED Diagnoses Final diagnoses:  Dysphagia, unspecified type     Carmin Muskrat, MD 04/04/21 1124

## 2021-04-04 NOTE — Progress Notes (Addendum)
Gregory [name] 88 y.o. April 08, 1932 [id number]  Assessment & Plan:   Encounter Diagnoses  Name Primary?   Esophageal dysphagia Yes   Odynophagia    GERD with stricture    Adult failure to thrive    He is worsening and needs an EGD very soon.  Needs to be admitted to the hospital and have his procedure there.  He is not a good candidate to have an outpatient ambulatory center endoscopy anytime soon and our availability is also limited.  Perhaps after we sort things through he may be able to have subsequent procedures in the ambulatory surgery center but not now.  I have recommended he go to the emergency room at [facility] for admission.  I will contact the hospitalist team and my colleagues about this.    He has held his Eliquis today and should continue to do so and we should be able to perform an EGD tomorrow barring other contraindications.   I have relayed this to the triage nurse at the emergency department.   CC: [name], [name], MD    Subjective:   Chief Complaint: Dysphagia and odynophagia  HPI 88 year old white man soon-to-be 89, who has a history of GERD with stricture status post dilation last in late 2021, presents with a 36-month history of worsening dysphagia and now severe dysphagia with limited ability to swallow liquids, and severe odynophagia.  Past medical history also notable for A. fib on Eliquis, heart failure with improved EF on medication, coronary artery disease as well.  Remote history of stroke.    He also has epigastric pain.  He has been in and out of the emergency department.  An unenhanced CT of the chest abdomen and pelvis showed some nonspecific lymphadenopathy in early January.  I have reviewed the images personally.  He had COVID in December.  He has been on some antibiotics for a UTI but no steroids.  He is getting weaker in a progressive fashion and is failing to thrive.  There is some pleuritic chest pain as well.  Anterior in the  lower chest.  He did not take his Eliquis today.  He believes he is losing weight though recent numbers do not reflect that he was 87 kg in March of last year and 81 kg in late December so he is losing.  Wt Readings from Last 3 Encounters:  04/04/21 174 lb (78.9 kg)  03/20/21 171 lb 15.3 oz (78 kg)  03/20/21 171 lb 15.3 oz (78 kg)   January 10 hemoglobin 11.6 creatinine 1.56  Allergies  Allergen Reactions   Percocet [Oxycodone-Acetaminophen] Itching and Nausea Only   Current Meds  Medication Sig   acetaminophen (TYLENOL) 500 MG tablet Take 500 mg by mouth every 6 (six) hours as needed for moderate pain.   apixaban (ELIQUIS) 2.5 MG TABS tablet TAKE 1 TABLET(2.5 MG) BY MOUTH TWICE DAILY   carvedilol (COREG) 3.125 MG tablet Take 1 tablet (3.125 mg total) by mouth 2 (two) times daily with a meal. TAKE 1 TABLET(3.125 MG) BY MOUTH TWICE DAILY WITH A MEAL   ezetimibe (ZETIA) 10 MG tablet TAKE 1 TABLET(10 MG) BY MOUTH DAILY   furosemide (LASIX) 40 MG tablet Take 20 mg by mouth.   levothyroxine (SYNTHROID) 25 MCG tablet TAKE 1 TABLET(25 MCG) BY MOUTH DAILY BEFORE BREAKFAST   lisinopril (ZESTRIL) 5 MG tablet TAKE 1 TABLET(5 MG) BY MOUTH DAILY   pantoprazole (PROTONIX) 40 MG tablet TAKE 1 TABLET(40 MG) BY MOUTH TWICE DAILY  rOPINIRole (REQUIP) 0.25 MG tablet TAKE 1 TABLET(0.25 MG) BY MOUTH TWICE DAILY   rosuvastatin (CRESTOR) 40 MG tablet TAKE 1 TABLET(40 MG) BY MOUTH DAILY   tiZANidine (ZANAFLEX) 4 MG tablet TAKE 1 TABLET(4 MG) BY MOUTH EVERY 8 HOURS AS NEEDED FOR MUSCLE SPASMS. DO NOT COMBINE WITH TRAMADOL   traMADol (ULTRAM) 50 MG tablet Take 1 tablet (50 mg total) by mouth every 6 (six) hours as needed.   Past Medical History:  Diagnosis Date   Acute respiratory failure ([place])    Appendicitis with abscess 03/31/2011   Arthritis    CAD (coronary artery disease),CABG 1993-LIMA to the LAD, SVG to Om and SVG to PDA/PLA, patent on cath 2008. 2003   a. s/p CABG 2003. b. last cath 2008 with  patent grafts.   Chronic atrial fibrillation ([facility])    permanent   Chronic systolic CHF (congestive heart failure) ([place])    a. Prior low EF, later normalized.   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 07/2015 - after holding Xarelto x 3 days for spinal procedure.   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 felt vasovagal in setting of pain from renal infarct.   Past Surgical History:  Procedure Laterality Date   BALLOON DILATION N/A 05/22/2015   Procedure: BALLOON DILATION;  Surgeon: Gatha Mayer, MD;  Location: [facility] ENDOSCOPY;  Service: Endoscopy;  Laterality: N/A;   BALLOON DILATION N/A 12/15/2018   Procedure: BALLOON DILATION;  Surgeon: Gatha Mayer, MD;  Location: [facility] ENDOSCOPY;  Service: Endoscopy;  Laterality: N/A;   CARDIAC CATHETERIZATION  02/24/2002   reduced LV function, 60-70% prox RCA stenosis, 70% PLA ostial stenosis, 80% secondary branch of PLA stenosis - subsequent CABG (Dr. Jackie Plum)   Carotid Doppler  06/2012   50-69% right bulb/prox ICA diameter reduction; 70-99% left bulb/prox ICA diameter reduction   CATARACT EXTRACTION W/ INTRAOCULAR LENS IMPLANT Bilateral 2013   COLONOSCOPY N/A 05/18/2016   Procedure: COLONOSCOPY;  Surgeon: Manus Gunning, MD;  Location[facility] ENDOSCOPY;  Service: Gastroenterology;  Laterality: N/A;   CORONARY ANGIOPLASTY  07/05/2006   3 vessel CAD, patent LIMA to LAD, patentVG to OM, patent SVG to PDA & PLA, mild MR, severe LV systolic dysfunction (Dr. Gerrie Nordmann)   CORONARY ARTERY BYPASS GRAFT  03/01/2002   LIMA to LAD, reverse SVG to OM, reverse SVG to PDA of  RCA, reverse SVG to PLA of RCA, ligation of LA appendage (Dr. Servando Snare)   ENTEROSCOPY N/A 05/20/2016   Procedure: ENTEROSCOPY;  Surgeon: Manus Gunning, MD;  Location: [place];  Service: Gastroenterology;  Laterality: N/A;   ESOPHAGOGASTRODUODENOSCOPY  02/01/2012   Procedure: ESOPHAGOGASTRODUODENOSCOPY (EGD);  Surgeon: Beryle Beams, MD;  Location: [facility];  Service: Endoscopy;  Laterality: N/A;   ESOPHAGOGASTRODUODENOSCOPY N/A 05/22/2015   Procedure: ESOPHAGOGASTRODUODENOSCOPY (EGD);  Surgeon: Gatha Mayer, MD;  Location: [facility] ENDOSCOPY;  Service: Endoscopy;  Laterality: N/A;   ESOPHAGOGASTRODUODENOSCOPY N/A 05/17/2016   Procedure: ESOPHAGOGASTRODUODENOSCOPY (EGD);  Surgeon: Juanita Craver, MD;  Location: [place];  Service:  Endoscopy;  Laterality: N/A;   ESOPHAGOGASTRODUODENOSCOPY N/A 09/17/2017   Procedure: ESOPHAGOGASTRODUODENOSCOPY (EGD);  Surgeon: Milus Banister, MD;  Location[facility] ENDOSCOPY;  Service: Endoscopy;  Laterality: N/A;   ESOPHAGOGASTRODUODENOSCOPY Left 02/09/2020   Procedure: ESOPHAGOGASTRODUODENOSCOPY (EGD);  Surgeon: Lavena Bullion, DO;  Location[facility] ENDOSCOPY;  Service: Gastroenterology;  Laterality: Left;   ESOPHAGOGASTRODUODENOSCOPY (EGD) WITH PROPOFOL N/A 12/15/2018   Procedure: ESOPHAGOGASTRODUODENOSCOPY (EGD) WITH PROPOFOL;  Surgeon: Gatha Mayer, MD;  Location: [facility] ENDOSCOPY;  Service: Endoscopy;  Laterality: N/A;   FOREIGN BODY REMOVAL  09/17/2017   Procedure: FOREIGN BODY REMOVAL;  Surgeon: Milus Banister, MD;  Location: [facility] ENDOSCOPY;  Service: Endoscopy;;   FOREIGN BODY REMOVAL  12/15/2018   Procedure: FOREIGN BODY REMOVAL;  Surgeon: Gatha Mayer, MD;  Location: [facility] ENDOSCOPY;  Service: Endoscopy;;   FOREIGN BODY REMOVAL  02/09/2020   Procedure: FOREIGN BODY REMOVAL;  Surgeon: Lavena Bullion, DO;  Location: [place];  Service: Gastroenterology;;   GIVENS CAPSULE STUDY N/A 05/18/2016   Procedure: GIVENS CAPSULE STUDY;  Surgeon: Manus Gunning, MD;  Location: [place];  Service: Gastroenterology;  Laterality: N/A;   LAPAROSCOPIC APPENDECTOMY  03/30/2011   Procedure: APPENDECTOMY LAPAROSCOPIC;  Surgeon: Joyice Faster. Cornett, MD;  Location: [place];  Service: General;  Laterality: N/A;   LEFT HEART CATHETERIZATION WITH CORONARY ANGIOGRAM N/A 01/24/2013   Procedure: LEFT HEART CATHETERIZATION WITH CORONARY ANGIOGRAM;  Surgeon: [name], MD;  Location: [facility] CATH LAB;  Service: Cardiovascular;  Laterality: N/A;  Right heart with grafts   LITHOTRIPSY     "once" (01/21/2013)   [place]  05/2009   persantine myoview - fixed moderate perfusion defect in inferior wall & lateral segment of apex (poor non-transmural infarction), minimal anterolateral periinfarct reversible ischemia seen, abnormal study, defects similar to 2006 study   RIGHT/LEFT HEART CATH AND CORONARY ANGIOGRAPHY N/A 09/17/2017   Procedure: RIGHT/LEFT HEART CATH AND CORONARY ANGIOGRAPHY;  Surgeon: Martinique, Peter M, MD;  Location: [place] CV LAB;  Service: Cardiovascular;  Laterality: N/A;   SPLENECTOMY, TOTAL  01/2013   TEE WITHOUT CARDIOVERSION N/A 01/23/2013   Procedure: TRANSESOPHAGEAL ECHOCARDIOGRAM (TEE);  Surgeon: Sanda Klein, MD;  Location: [facility] ENDOSCOPY;  Service: Cardiovascular;  Laterality: N/A;   TRANSTHORACIC ECHOCARDIOGRAM  06/23/2012   EF 40-45%, mild LVH; mild AV regurg; mild MV regurg; LV mod-severely dilated; RV mildly dilated; systolic pressure borderline increased; RA mod dilated   Social History   Social History Narrative    married, 5 sons 4 daughters. He is retired Horticulturist, commercial. 2-3 caffeinated beverages a day no alcohol   family history includes Heart attack (age of onset: 78) in his brother; Heart disease in his father; Stroke in his mother; Stroke (age of onset: 36) in his brother.   Review of Systems As above  Objective:   Physical Exam BP 110/70    Pulse 60    Ht 5\' 6"  (1.676 m)    Wt 174 lb (78.9 kg)    BMI  28.08 kg/m  Frail elderly white man in a wheelchair Eyes anicteric Mouth shows a coated tongue and dentures I do not see any thrush No significant lymphadenopathy of the neck Breath sounds are reduced throughout, there are no rales or wheezes Distant irregular heart rhythm normal rate no murmur Abdomen with mild epigastric tenderness He appears alert and oriented x3

## 2021-04-04 NOTE — Progress Notes (Addendum)
Patient ID: Gregory [name], male   DOB: 1932/10/04, 88 y.o.   MRN: [id number]    Progress Note   Subjective   Day # 1  CC; 31-month history of progressive dysphagia, odynophagia, epigastric pain  Seen in office earlier today per Dr. Sinclair Ship full note-chronic GERD, prior history of esophageal stricture with several dilations. Last EGD December 2021 with 2 distal strictures noted both were dilated TTS 13.5-15.5 and TTS 16 to 18 mm also noted 1 cratered esophageal ulcer 5 mm, biopsies negative for malignancy.  Last dose Eliquis last p.m.  Labs today-WBC 9.1/hemoglobin 10.5/hematocrit 32.7 Sodium 132/potassium 4.2/155/creatinine 2.5 (up from 1.510 days ago) PTT 43  Blood pressure soft, 98/50 Patient says he has been unable to sleep for the past 3 days, asking for something to help for sleep.   Objective   Vital signs in last 24 hours: Temp:  [97.4 F (36.3 C)] 97.4 F (36.3 C) (01/20 1001) Pulse Rate:  [51-77] 67 (01/20 1315) Resp:  [13-22] 15 (01/20 1315) BP: (81-112)/(47-88) 100/50 (01/20 1315) SpO2:  [94 %-100 %] 97 % (01/20 1315) Weight:  [78.9 kg-79 kg] 79 kg (01/20 1000)   General:    Elderly white male in NAD, frail-appearing Heart:  Regular rate and rhythm; no murmurs Lungs: Respirations even and unlabored, lungs CTA bilaterally Abdomen:  Soft, nontender and nondistended. Normal bowel sounds. Extremities:  Without edema. Neurologic:  Alert and oriented,  grossly normal neurologically. Psych:  Cooperative. Normal mood and affect.  Intake/Output from previous day: No intake/output data recorded. Intake/Output this shift: No intake/output data recorded.  Lab Results: Recent Labs    04/04/21 1026  WBC 9.1  HGB 10.5*  HCT 32.7*  PLT 275   BMET Recent Labs    04/04/21 1026  NA 133*  K 4.2  CL 96*  CO2 28  GLUCOSE 108*  BUN 55*  CREATININE 2.52*  CALCIUM 8.1*   LFT Recent Labs    04/04/21 1026  PROT 7.2  ALBUMIN 1.9*  AST 178*  ALT 79*   ALKPHOS 115  BILITOT 0.5   PT/INR No results for input(s): LABPROT, INR in the last 72 hours.  Studies/Results: DG Chest Port 1 View  Result Date: 04/04/2021 CLINICAL DATA:  Dysphagia EXAM: PORTABLE CHEST 1 VIEW COMPARISON:  Chest x-ray 03/25/2021 FINDINGS: Heart is enlarged. Mediastinum appears stable. Calcified plaques in the aortic arch. Cardiac surgical changes and median sternotomy wires. Pulmonary vasculature is within normal limits. No focal consolidation identified. No pleural effusion or pneumothorax. IMPRESSION: Cardiomegaly with no acute process identified. Electronically Signed   By: Ofilia Neas M.D.   On: 04/04/2021 10:49       Assessment / Plan:    #92 88 year old white male with 28-month history of progressively severe dysphagia and odynophagia, associated weight loss and generalized weakness  History of chronic GERD and previous esophageal strictures requiring dilation, last EGD with dilation of 2 distal strictures December 2021  Suspect recurrent severe peptic stricture, may have component of an acute esophagitis, rule out malignancy  #2 chronic anticoagulation-on Eliquis-last dose last night #3 acute kidney injury secondary to above #4 coronary artery disease #5 history of atrial fibrillation-Eliquis #6 congestive heart failure last EF 40 to 45% #7 status post COVID infection December 2022  Plan; full liquid diet as tolerated Start IV PPI twice daily Management of acute kidney injury per medicine service Hold Eliquis-if heparin started will need to hold for at least 6 hours prior to EGD with dilation  Earliest day  for EGD with dilation would be Sunday. Dr. Benson Norway will follow from GI standpoint this weekend and he will determine timing of EGD, either Sunday or Monday     Principal Problem:   Dysphagia     LOS: 0 days   Amy Esterwood PA-C 04/04/2021, 1:35 PM   Attending physician's note  I agree with the APP's note, impression and recommendations.     Patient was seen by Dr. Carlean Purl earlier in the office today and sent to ER for progressive dysphagia and odynophagia He has history of known distal esophageal stricture  Last dose of Eliquis 1/19 Hold Eliquis IV PPI twice daily Full liquid diet  He will need repeat EGD with esophageal dilation, tentatively plan for Sunday to give adequate time for Eliquis to washout  Dr. Benson Norway will be covering for [facility] this weekend, he will round on the patient  The patient was provided an opportunity to ask questions and all were answered. The patient agreed with the plan and demonstrated an understanding of the instructions.  Damaris Hippo , MD [phone]

## 2021-04-04 NOTE — Progress Notes (Signed)
PIV inserted. Pt tolerated with no problems.  VWilliams,RN.

## 2021-04-04 NOTE — H&P (View-Only) (Signed)
Gregory [name] 88 y.o. 17-Mar-1932 [id number]  Assessment & Plan:   Encounter Diagnoses  Name Primary?   Esophageal dysphagia Yes   Odynophagia    GERD with stricture    Adult failure to thrive    He is worsening and needs an EGD very soon.  Needs to be admitted to the hospital and have his procedure there.  He is not a good candidate to have an outpatient ambulatory center endoscopy anytime soon and our availability is also limited.  Perhaps after we sort things through he may be able to have subsequent procedures in the ambulatory surgery center but not now.  I have recommended he go to the emergency room at [facility] for admission.  I will contact the hospitalist team and my colleagues about this.    He has held his Eliquis today and should continue to do so and we should be able to perform an EGD tomorrow barring other contraindications.   I have relayed this to the triage nurse at the emergency department.   CC: [name], [name], MD    Subjective:   Chief Complaint: Dysphagia and odynophagia  HPI 88 year old white man soon-to-be 89, who has a history of GERD with stricture status post dilation last in late 2021, presents with a 13-month history of worsening dysphagia and now severe dysphagia with limited ability to swallow liquids, and severe odynophagia.  Past medical history also notable for A. fib on Eliquis, heart failure with improved EF on medication, coronary artery disease as well.  Remote history of stroke.    He also has epigastric pain.  He has been in and out of the emergency department.  An unenhanced CT of the chest abdomen and pelvis showed some nonspecific lymphadenopathy in early January.  I have reviewed the images personally.  He had COVID in December.  He has been on some antibiotics for a UTI but no steroids.  He is getting weaker in a progressive fashion and is failing to thrive.  There is some pleuritic chest pain as well.  Anterior in the  lower chest.  He did not take his Eliquis today.  He believes he is losing weight though recent numbers do not reflect that he was 87 kg in March of last year and 81 kg in late December so he is losing.  Wt Readings from Last 3 Encounters:  04/04/21 174 lb (78.9 kg)  03/20/21 171 lb 15.3 oz (78 kg)  03/20/21 171 lb 15.3 oz (78 kg)   January 10 hemoglobin 11.6 creatinine 1.56  Allergies  Allergen Reactions   Percocet [Oxycodone-Acetaminophen] Itching and Nausea Only   Current Meds  Medication Sig   acetaminophen (TYLENOL) 500 MG tablet Take 500 mg by mouth every 6 (six) hours as needed for moderate pain.   apixaban (ELIQUIS) 2.5 MG TABS tablet TAKE 1 TABLET(2.5 MG) BY MOUTH TWICE DAILY   carvedilol (COREG) 3.125 MG tablet Take 1 tablet (3.125 mg total) by mouth 2 (two) times daily with a meal. TAKE 1 TABLET(3.125 MG) BY MOUTH TWICE DAILY WITH A MEAL   ezetimibe (ZETIA) 10 MG tablet TAKE 1 TABLET(10 MG) BY MOUTH DAILY   furosemide (LASIX) 40 MG tablet Take 20 mg by mouth.   levothyroxine (SYNTHROID) 25 MCG tablet TAKE 1 TABLET(25 MCG) BY MOUTH DAILY BEFORE BREAKFAST   lisinopril (ZESTRIL) 5 MG tablet TAKE 1 TABLET(5 MG) BY MOUTH DAILY   pantoprazole (PROTONIX) 40 MG tablet TAKE 1 TABLET(40 MG) BY MOUTH TWICE DAILY  rOPINIRole (REQUIP) 0.25 MG tablet TAKE 1 TABLET(0.25 MG) BY MOUTH TWICE DAILY   rosuvastatin (CRESTOR) 40 MG tablet TAKE 1 TABLET(40 MG) BY MOUTH DAILY   tiZANidine (ZANAFLEX) 4 MG tablet TAKE 1 TABLET(4 MG) BY MOUTH EVERY 8 HOURS AS NEEDED FOR MUSCLE SPASMS. DO NOT COMBINE WITH TRAMADOL   traMADol (ULTRAM) 50 MG tablet Take 1 tablet (50 mg total) by mouth every 6 (six) hours as needed.   Past Medical History:  Diagnosis Date   Acute respiratory failure ([place])    Appendicitis with abscess 03/31/2011   Arthritis    CAD (coronary artery disease),CABG 1993-LIMA to the LAD, SVG to Om and SVG to PDA/PLA, patent on cath 2008. 2003   a. s/p CABG 2003. b. last cath 2008 with  patent grafts.   Chronic atrial fibrillation (HCC)    permanent   Chronic systolic CHF (congestive heart failure) ([place])    a. Prior low EF, later normalized.   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 07/2015 - after holding Xarelto x 3 days for spinal procedure.   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 felt vasovagal in setting of pain from renal infarct.   Past Surgical History:  Procedure Laterality Date   BALLOON DILATION N/A 05/22/2015   Procedure: BALLOON DILATION;  Surgeon: Gatha Mayer, MD;  Location: [facility] ENDOSCOPY;  Service: Endoscopy;  Laterality: N/A;   BALLOON DILATION N/A 12/15/2018   Procedure: BALLOON DILATION;  Surgeon: Gatha Mayer, MD;  Location: [facility] ENDOSCOPY;  Service: Endoscopy;  Laterality: N/A;   CARDIAC CATHETERIZATION  02/24/2002   reduced LV function, 60-70% prox RCA stenosis, 70% PLA ostial stenosis, 80% secondary branch of PLA stenosis - subsequent CABG (Dr. Jackie Plum)   Carotid Doppler  06/2012   50-69% right bulb/prox ICA diameter reduction; 70-99% left bulb/prox ICA diameter reduction   CATARACT EXTRACTION W/ INTRAOCULAR LENS IMPLANT Bilateral 2013   COLONOSCOPY N/A 05/18/2016   Procedure: COLONOSCOPY;  Surgeon: Manus Gunning, MD;  Location: [facility] ENDOSCOPY;  Service: Gastroenterology;  Laterality: N/A;   CORONARY ANGIOPLASTY  07/05/2006   3 vessel CAD, patent LIMA to LAD, patentVG to OM, patent SVG to PDA & PLA, mild MR, severe LV systolic dysfunction (Dr. Gerrie Nordmann)   CORONARY ARTERY BYPASS GRAFT  03/01/2002   LIMA to LAD, reverse SVG to OM, reverse SVG to PDA of  RCA, reverse SVG to PLA of RCA, ligation of LA appendage (Dr. Servando Snare)   ENTEROSCOPY N/A 05/20/2016   Procedure: ENTEROSCOPY;  Surgeon: Manus Gunning, MD;  Location: [place];  Service: Gastroenterology;  Laterality: N/A;   ESOPHAGOGASTRODUODENOSCOPY  02/01/2012   Procedure: ESOPHAGOGASTRODUODENOSCOPY (EGD);  Surgeon: Beryle Beams, MD;  Location: [facility];  Service: Endoscopy;  Laterality: N/A;   ESOPHAGOGASTRODUODENOSCOPY N/A 05/22/2015   Procedure: ESOPHAGOGASTRODUODENOSCOPY (EGD);  Surgeon: Gatha Mayer, MD;  Location: [facility] ENDOSCOPY;  Service: Endoscopy;  Laterality: N/A;   ESOPHAGOGASTRODUODENOSCOPY N/A 05/17/2016   Procedure: ESOPHAGOGASTRODUODENOSCOPY (EGD);  Surgeon: Juanita Craver, MD;  Location: [place];  Service:  Endoscopy;  Laterality: N/A;   ESOPHAGOGASTRODUODENOSCOPY N/A 09/17/2017   Procedure: ESOPHAGOGASTRODUODENOSCOPY (EGD);  Surgeon: Milus Banister, MD;  Location: [facility] ENDOSCOPY;  Service: Endoscopy;  Laterality: N/A;   ESOPHAGOGASTRODUODENOSCOPY Left 02/09/2020   Procedure: ESOPHAGOGASTRODUODENOSCOPY (EGD);  Surgeon: Lavena Bullion, DO;  Location: [facility] ENDOSCOPY;  Service: Gastroenterology;  Laterality: Left;   ESOPHAGOGASTRODUODENOSCOPY (EGD) WITH PROPOFOL N/A 12/15/2018   Procedure: ESOPHAGOGASTRODUODENOSCOPY (EGD) WITH PROPOFOL;  Surgeon: Gatha Mayer, MD;  Location: [facility] ENDOS[facility];  Service: Endoscopy;  Laterality: N/A;   FOREIGN BODY REMOVAL  09/17/2017   Procedure: FOREIGN BODY REMOVAL;  Surgeon: Milus Banister, MD;  Location: [facility] ENDOSCOPY;  Service: Endoscopy;;   FOREIGN BODY REMOVAL  12/15/2018   Procedure: FOREIGN BODY REMOVAL;  Surgeon: Gatha Mayer, MD;  Location: [facility] ENDOSCOPY;  Service: Endoscopy;;   FOREIGN BODY REMOVAL  02/09/2020   Procedure: FOREIGN BODY REMOVAL;  Surgeon: Lavena Bullion, DO;  Location: [place];  Service: Gastroenterology;;   GIVENS CAPSULE STUDY N/A 05/18/2016   Procedure: GIVENS CAPSULE STUDY;  Surgeon: Manus Gunning, MD;  Location: [place];  Service: Gastroenterology;  Laterality: N/A;   LAPAROSCOPIC APPENDECTOMY  03/30/2011   Procedure: APPENDECTOMY LAPAROSCOPIC;  Surgeon: Joyice Faster. Cornett, MD;  Location: [place];  Service: General;  Laterality: N/A;   LEFT HEART CATHETERIZATION WITH CORONARY ANGIOGRAM N/A 01/24/2013   Procedure: LEFT HEART CATHETERIZATION WITH CORONARY ANGIOGRAM;  Surgeon: [name], MD;  Location: [facility] CATH LAB;  Service: Cardiovascular;  Laterality: N/A;  Right heart with grafts   LITHOTRIPSY     "once" (01/21/2013)   [place]  05/2009   persantine myoview - fixed moderate perfusion defect in inferior wall & lateral segment of apex (poor non-transmural infarction), minimal anterolateral periinfarct reversible ischemia seen, abnormal study, defects similar to 2006 study   RIGHT/LEFT HEART CATH AND CORONARY ANGIOGRAPHY N/A 09/17/2017   Procedure: RIGHT/LEFT HEART CATH AND CORONARY ANGIOGRAPHY;  Surgeon: Martinique, Peter M, MD;  Location: [place] CV LAB;  Service: Cardiovascular;  Laterality: N/A;   SPLENECTOMY, TOTAL  01/2013   TEE WITHOUT CARDIOVERSION N/A 01/23/2013   Procedure: TRANSESOPHAGEAL ECHOCARDIOGRAM (TEE);  Surgeon: Sanda Klein, MD;  Location[facility] ENDOSCOPY;  Service: Cardiovascular;  Laterality: N/A;   TRANSTHORACIC ECHOCARDIOGRAM  06/23/2012   EF 40-45%, mild LVH; mild AV regurg; mild MV regurg; LV mod-severely dilated; RV mildly dilated; systolic pressure borderline increased; RA mod dilated   Social History   Social History Narrative    married, 5 sons 4 daughters. He is retired Horticulturist, commercial. 2-3 caffeinated beverages a day no alcohol   family history includes Heart attack (age of onset: 89) in his brother; Heart disease in his father; Stroke in his mother; Stroke (age of onset: 90) in his brother.   Review of Systems As above  Objective:   Physical Exam BP 110/70    Pulse 60    Ht 5\' 6"  (1.676 m)    Wt 174 lb (78.9 kg)    BMI  28.08 kg/m  Frail elderly white man in a wheelchair Eyes anicteric Mouth shows a coated tongue and dentures I do not see any thrush No significant lymphadenopathy of the neck Breath sounds are reduced throughout, there are no rales or wheezes Distant irregular heart rhythm normal rate no murmur Abdomen with mild epigastric tenderness He appears alert and oriented x3

## 2021-04-04 NOTE — Progress Notes (Signed)
ANTICOAGULATION CONSULT NOTE - Initial Consult  Pharmacy Consult for IV Heparin Indication: atrial fibrillation  Allergies  Allergen Reactions   Percocet [Oxycodone-Acetaminophen] Itching and Nausea Only    Patient Measurements: Height: 5\' 6"  (167.6 cm) Weight: 79 kg (174 lb 2.6 oz) IBW/kg (Calculated) : 63.8 Heparin Dosing Weight:   Vital Signs: Temp: 97.4 F (36.3 C) (01/20 1001) Temp Source: Oral (01/20 1001) BP: 100/50 (01/20 1315) Pulse Rate: 67 (01/20 1315)  Labs: Recent Labs    04/04/21 1026 04/04/21 1032  HGB 10.5*  --   HCT 32.7*  --   PLT 275  --   APTT  --  43*  HEPARINUNFRC  --  >1.10*  CREATININE 2.52*  --     Estimated Creatinine Clearance: 20.8 mL/min (A) (by C-G formula based on SCr of 2.52 mg/dL (H)).   Medical History: Past Medical History:  Diagnosis Date   Acute respiratory failure ([place])    Appendicitis with abscess 03/31/2011   Arthritis    CAD (coronary artery disease),CABG 1993-LIMA to the LAD, SVG to Om and SVG to PDA/PLA, patent on cath 2008. 2003   a. s/p CABG 2003. b. last cath 2008 with patent grafts.   Chronic atrial fibrillation (HCC)    permanent   Chronic systolic CHF (congestive heart failure) ([place])    a. Prior low EF, later normalized.   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 07/2015 - after holding Xarelto x 3 days for spinal procedure.   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 felt vasovagal in setting of pain from renal infarct.    Medications:  (Not in a hospital admission)   Assessment: Pharmacy is consulted to dose heparin  in 88 yo male with PMH of atrial fibrillation. Pt on apixaban 2.5 mg PO BID. PO med on hold in anticipation of EGD with GI team.   Today, 04/04/21 Baseline labs indicate recent apixaban use as HL >1.10, aPTT is 43 sec  Hgb 10.5, plt 275  Scr 2.52 mg/dl, CrCl 21 ml/min    Goal of Therapy:  Heparin level 0.3-0.7 units/ml Monitor platelets by anticoagulation protocol: Yes   Plan:  As pt still has apixaban on board as evidenced by above labs will hold off on bolus  Start heparin 1200 units/hr Obtain aPTT 8 hours after start of infusion.  Daily CBC while on heparin Monitor for signs and symptoms of bleeding   Royetta Asal, PharmD, BCPS 04/04/2021 1:41 PM

## 2021-04-04 NOTE — ED Notes (Signed)
Dinner tray provided

## 2021-04-04 NOTE — Patient Instructions (Signed)
Go to the emergency department at [facility]  You need to be admitted.  I will contact the hospitalist team and also my partners.  You need an endoscopy tomorrow to evaluate and treat your swallowing problems.  Show this paper to the nurse.  Gatha Mayer, MD, [facility]

## 2021-04-04 NOTE — ED Notes (Signed)
EDP at the bedside.  ?

## 2021-04-04 NOTE — ED Notes (Signed)
EDP notified pt's systolic BP remains in the low 90's despite liter bolus of normal saline on pressure bag. No additional orders or recommendations at this time. Per EDP will re-evaluate after IVF. Will continue to monitor.

## 2021-04-04 NOTE — ED Triage Notes (Signed)
BIB daughter, sent over by [facility] for a scope, pt reports not eating in 3 weeks, pt reports he can swallow just 'very slow.' Did take eliquis at 9pm yesterday, has not taken it today. Upper abdominal pain w/ coughing, breathing hard or swallowing.

## 2021-04-05 ENCOUNTER — Observation Stay (HOSPITAL_COMMUNITY): Payer: Medicare Other

## 2021-04-05 DIAGNOSIS — I5032 Chronic diastolic (congestive) heart failure: Secondary | ICD-10-CM

## 2021-04-05 DIAGNOSIS — N179 Acute kidney failure, unspecified: Secondary | ICD-10-CM

## 2021-04-05 DIAGNOSIS — I110 Hypertensive heart disease with heart failure: Secondary | ICD-10-CM | POA: Diagnosis not present

## 2021-04-05 DIAGNOSIS — I959 Hypotension, unspecified: Secondary | ICD-10-CM | POA: Diagnosis not present

## 2021-04-05 DIAGNOSIS — Z720 Tobacco use: Secondary | ICD-10-CM | POA: Diagnosis not present

## 2021-04-05 DIAGNOSIS — Z7901 Long term (current) use of anticoagulants: Secondary | ICD-10-CM | POA: Diagnosis not present

## 2021-04-05 DIAGNOSIS — E038 Other specified hypothyroidism: Secondary | ICD-10-CM

## 2021-04-05 DIAGNOSIS — I2510 Atherosclerotic heart disease of native coronary artery without angina pectoris: Secondary | ICD-10-CM | POA: Diagnosis present

## 2021-04-05 DIAGNOSIS — I10 Essential (primary) hypertension: Secondary | ICD-10-CM | POA: Diagnosis not present

## 2021-04-05 DIAGNOSIS — I482 Chronic atrial fibrillation, unspecified: Secondary | ICD-10-CM | POA: Diagnosis not present

## 2021-04-05 DIAGNOSIS — K219 Gastro-esophageal reflux disease without esophagitis: Secondary | ICD-10-CM | POA: Diagnosis present

## 2021-04-05 DIAGNOSIS — I5042 Chronic combined systolic (congestive) and diastolic (congestive) heart failure: Secondary | ICD-10-CM | POA: Diagnosis present

## 2021-04-05 DIAGNOSIS — K3189 Other diseases of stomach and duodenum: Secondary | ICD-10-CM | POA: Diagnosis present

## 2021-04-05 DIAGNOSIS — R7989 Other specified abnormal findings of blood chemistry: Secondary | ICD-10-CM

## 2021-04-05 DIAGNOSIS — D649 Anemia, unspecified: Secondary | ICD-10-CM

## 2021-04-05 DIAGNOSIS — N1831 Chronic kidney disease, stage 3a: Secondary | ICD-10-CM | POA: Diagnosis not present

## 2021-04-05 DIAGNOSIS — Z20822 Contact with and (suspected) exposure to covid-19: Secondary | ICD-10-CM | POA: Diagnosis not present

## 2021-04-05 DIAGNOSIS — K828 Other specified diseases of gallbladder: Secondary | ICD-10-CM | POA: Diagnosis not present

## 2021-04-05 DIAGNOSIS — I130 Hypertensive heart and chronic kidney disease with heart failure and stage 1 through stage 4 chronic kidney disease, or unspecified chronic kidney disease: Secondary | ICD-10-CM | POA: Diagnosis not present

## 2021-04-05 DIAGNOSIS — M1990 Unspecified osteoarthritis, unspecified site: Secondary | ICD-10-CM | POA: Diagnosis present

## 2021-04-05 DIAGNOSIS — R1310 Dysphagia, unspecified: Secondary | ICD-10-CM | POA: Diagnosis not present

## 2021-04-05 DIAGNOSIS — I495 Sick sinus syndrome: Secondary | ICD-10-CM | POA: Diagnosis not present

## 2021-04-05 DIAGNOSIS — R627 Adult failure to thrive: Secondary | ICD-10-CM | POA: Diagnosis present

## 2021-04-05 DIAGNOSIS — I255 Ischemic cardiomyopathy: Secondary | ICD-10-CM | POA: Diagnosis present

## 2021-04-05 DIAGNOSIS — E7800 Pure hypercholesterolemia, unspecified: Secondary | ICD-10-CM

## 2021-04-05 DIAGNOSIS — R1319 Other dysphagia: Secondary | ICD-10-CM | POA: Diagnosis not present

## 2021-04-05 DIAGNOSIS — G2581 Restless legs syndrome: Secondary | ICD-10-CM | POA: Diagnosis not present

## 2021-04-05 DIAGNOSIS — E782 Mixed hyperlipidemia: Secondary | ICD-10-CM | POA: Diagnosis present

## 2021-04-05 DIAGNOSIS — E860 Dehydration: Secondary | ICD-10-CM | POA: Diagnosis present

## 2021-04-05 DIAGNOSIS — I509 Heart failure, unspecified: Secondary | ICD-10-CM | POA: Diagnosis not present

## 2021-04-05 DIAGNOSIS — E039 Hypothyroidism, unspecified: Secondary | ICD-10-CM | POA: Diagnosis present

## 2021-04-05 DIAGNOSIS — K222 Esophageal obstruction: Secondary | ICD-10-CM | POA: Diagnosis not present

## 2021-04-05 DIAGNOSIS — K7469 Other cirrhosis of liver: Secondary | ICD-10-CM | POA: Diagnosis not present

## 2021-04-05 DIAGNOSIS — I480 Paroxysmal atrial fibrillation: Secondary | ICD-10-CM | POA: Diagnosis present

## 2021-04-05 DIAGNOSIS — Z8616 Personal history of COVID-19: Secondary | ICD-10-CM | POA: Diagnosis not present

## 2021-04-05 LAB — CBC WITH DIFFERENTIAL/PLATELET
Abs Immature Granulocytes: 0.06 10*3/uL (ref 0.00–0.07)
Basophils Absolute: 0 10*3/uL (ref 0.0–0.1)
Basophils Relative: 0 %
Eosinophils Absolute: 0.1 10*3/uL (ref 0.0–0.5)
Eosinophils Relative: 1 %
HCT: 28.5 % — ABNORMAL LOW (ref 39.0–52.0)
Hemoglobin: 8.9 g/dL — ABNORMAL LOW (ref 13.0–17.0)
Immature Granulocytes: 1 %
Lymphocytes Relative: 16 %
Lymphs Abs: 1.3 10*3/uL (ref 0.7–4.0)
MCH: 30.2 pg (ref 26.0–34.0)
MCHC: 31.2 g/dL (ref 30.0–36.0)
MCV: 96.6 fL (ref 80.0–100.0)
Monocytes Absolute: 0.6 10*3/uL (ref 0.1–1.0)
Monocytes Relative: 7 %
Neutro Abs: 6.2 10*3/uL (ref 1.7–7.7)
Neutrophils Relative %: 75 %
Platelets: 280 10*3/uL (ref 150–400)
RBC: 2.95 MIL/uL — ABNORMAL LOW (ref 4.22–5.81)
RDW: 14.1 % (ref 11.5–15.5)
WBC: 8.2 10*3/uL (ref 4.0–10.5)
nRBC: 0 % (ref 0.0–0.2)

## 2021-04-05 LAB — COMPREHENSIVE METABOLIC PANEL
ALT: 52 U/L — ABNORMAL HIGH (ref 0–44)
AST: 113 U/L — ABNORMAL HIGH (ref 15–41)
Albumin: 1.5 g/dL — ABNORMAL LOW (ref 3.5–5.0)
Alkaline Phosphatase: 90 U/L (ref 38–126)
Anion gap: 9 (ref 5–15)
BUN: 40 mg/dL — ABNORMAL HIGH (ref 8–23)
CO2: 23 mmol/L (ref 22–32)
Calcium: 7.4 mg/dL — ABNORMAL LOW (ref 8.9–10.3)
Chloride: 102 mmol/L (ref 98–111)
Creatinine, Ser: 1.99 mg/dL — ABNORMAL HIGH (ref 0.61–1.24)
GFR, Estimated: 32 mL/min — ABNORMAL LOW (ref >60)
Glucose, Bld: 57 mg/dL — ABNORMAL LOW (ref 70–99)
Potassium: 4.1 mmol/L (ref 3.5–5.1)
Sodium: 134 mmol/L — ABNORMAL LOW (ref 135–145)
Total Bilirubin: 0.7 mg/dL (ref 0.3–1.2)
Total Protein: 5.4 g/dL — ABNORMAL LOW (ref 6.5–8.1)

## 2021-04-05 LAB — FERRITIN: Ferritin: 846 ng/mL — ABNORMAL HIGH (ref 24–336)

## 2021-04-05 LAB — HEPATITIS PANEL, ACUTE
HCV Ab: NONREACTIVE
Hep A IgM: NONREACTIVE
Hep B C IgM: NONREACTIVE
Hepatitis B Surface Ag: NONREACTIVE

## 2021-04-05 LAB — D-DIMER, QUANTITATIVE: D-Dimer, Quant: 3.9 ug/mL-FEU — ABNORMAL HIGH (ref 0.00–0.50)

## 2021-04-05 LAB — LACTATE DEHYDROGENASE: LDH: 152 U/L (ref 98–192)

## 2021-04-05 LAB — APTT
aPTT: 100 seconds — ABNORMAL HIGH (ref 24–36)
aPTT: 93 seconds — ABNORMAL HIGH (ref 24–36)

## 2021-04-05 LAB — SEDIMENTATION RATE: Sed Rate: 121 mm/hr — ABNORMAL HIGH (ref 0–16)

## 2021-04-05 LAB — HEPARIN LEVEL (UNFRACTIONATED): Heparin Unfractionated: >1.1 IU/mL — ABNORMAL HIGH (ref 0.30–0.70)

## 2021-04-05 NOTE — Progress Notes (Addendum)
PROGRESS NOTE    Gregory [name]  YJE:[id number] DOB: 10-12-1932 DOA: 04/04/2021 PCP: Darreld Mclean, MD     Brief Narrative:   88 y.o. WM PMHx A fib, HTN, CAD, Chronic Diastolic CHF, restless leg syndrome (RLS),   Presenting with dysphagia. He has had swallowing issue for several months. At first it was just with solids; but now it has progressed to liquids as well. He is still able to take his pills. Understandably during this time, he has progressively felt weaker and has had increased difficulty with mobility. He was seen in GI clinic this morning and it was recommended that he come to the ED for evaluation.    ED Course: [facility] was consulted. He was given fluids. [facility] was called for admission.    Subjective: A/O x4, patient understands plan for the a.m. is to have EGD   Assessment & Plan: Covid vaccination;   Principal Problem:   Dysphagia Active Problems:   Chronic a-fib (HCC)   Chronic diastolic CHF (congestive heart failure) (HCC)   Essential hypertension   HLD (hyperlipidemia)   Elevated liver enzymes   Acute renal failure superimposed on stage 3a chronic kidney disease (HCC)   Anemia, unspecified   Hypothyroidism   Restless leg syndrome   Failure to thrive in adult    Dysphagia -Per Dr. Carol Ada, GI,EGD will be performed tomorrow, however, it is not clear if he will be able to undergo a dilation with the new findings of cirrhosis.  If he has esophageal varices, dilation will not be performed. -Check for HCV, HBV, alpha-1-antitrypsin, iron panel, ferritin, ANA, ASMA, and AMA.    Chronic diastolic CHF -Strict in and out - Daily weight  A. fib - On Eliquis (hold) for surgery -Heparin per pharmacy until 2400 -Currently NSR - BP control currently without medication  Essential HTN - See A. Fib  CAD -See CHF  HLD - Hold statin secondary to elevated LFT  Elevated LFT - Acute hepatitis panel pending - US abdomen RUQ pending - Hold statin  Acute  on CKD stage IIIa  -Most likely secondary to dehydration - Will hydrate prior to surgery: Normal saline 62ml/hr   Unspecified anemia - Anemia panel pending - Occult blood pending - DC Eliquis: DC heparin 2400 in preparation for his surgery - Transfuse for hemoglobin <7         GERD    Hypothyroidism - Synthroid 25 mcg daily -TSH pending   Restless leg syndrome -Requip 0.25 mg BID  COVID Positive December 22 -Per [facility] protocol was not tested upon admission, will obtain COVID test per protocol  Failure to thrive - Generalized weakness - Poor p.o. intake secondary to dysphagia       DVT prophylaxis: Heparin drip Code Status:  Family Communication: 1/21 daughter at bedside for discussion of plan of care all questions answered Status is: Inpatient    Dispo: The patient is from:               Anticipated d/c is to:               Anticipated d/c date is:               Patient currently       Consultants:  Dr. Carol Ada, GI    Procedures/Significant Events:    I have personally reviewed and interpreted all radiology studies and my findings are as above.  VENTILATOR SETTINGS:    Cultures   Antimicrobials:  Devices    LINES / TUBES:      Continuous Infusions:  sodium chloride 75 mL/hr at 04/06/21 0507   heparin 1,100 Units/hr (04/06/21 0805)     Objective: Vitals:   04/05/21 0416 04/05/21 1339 04/05/21 2118 04/06/21 0421  BP: (!) 112/41 (!) 117/54 (!) 120/54 (!) 111/46  Pulse: 84 68 72 84  Resp: 16 18 20 20   Temp: 98.1 F (36.7 C) 98 F (36.7 C) (!) 97.5 F (36.4 C) (!) 97.5 F (36.4 C)  TempSrc: Oral Oral Oral Oral  SpO2: 92% 96% 94% 91%  Weight:      Height:        Intake/Output Summary (Last 24 hours) at 04/06/2021 2376 Last data filed at 04/06/2021 2831 Gross per 24 hour  Intake 746 ml  Output 600 ml  Net 146 ml   Filed Weights   04/04/21 1000  Weight: 79 kg    Examination:  General: A/O x4 No acute  respiratory distress, cachectic Eyes: negative scleral hemorrhage, negative anisocoria, negative icterus ENT: Negative Runny nose, negative gingival bleeding, Neck:  Negative scars, masses, torticollis, lymphadenopathy, JVD Lungs: decreased breath sounds bilaterally without wheezes or crackles Cardiovascular: Regular rate and rhythm without murmur gallop or rub normal S1 and S2 Abdomen: negative abdominal pain, nondistended, positive soft, bowel sounds, no rebound, no ascites, no appreciable mass Extremities: No significant cyanosis, clubbing, or edema bilateral lower extremities Skin: Negative rashes, lesions, ulcers Psychiatric:  Negative depression, negative anxiety, negative fatigue, negative mania  Central nervous system:  Cranial nerves II through XII intact, tongue/uvula midline, all extremities muscle strength 5/5, sensation intact throughout, negative dysarthria, negative expressive aphasia, negative receptive aphasia.  .     Data Reviewed: Care during the described time interval was provided by me .  I have reviewed this patient's available data, including medical history, events of note, physical examination, and all test results as part of my evaluation.  CBC: Recent Labs  Lab 04/04/21 1026 04/05/21 0614 04/06/21 0536  WBC 9.1 8.2 9.5  NEUTROABS 6.9 6.2 7.4  HGB 10.5* 8.9* 9.7*  HCT 32.7* 28.5* 30.5*  MCV 96.7 96.6 97.1  PLT 275 280 517   Basic Metabolic Panel: Recent Labs  Lab 04/04/21 1026 04/05/21 0614 04/06/21 0536  NA 133* 134* 134*  K 4.2 4.1 3.9  CL 96* 102 102  CO2 28 23 25   GLUCOSE 108* 57* 92  BUN 55* 40* 32*  CREATININE 2.52* 1.99* 1.77*  CALCIUM 8.1* 7.4* 7.8*  MG  --   --  1.6*  PHOS  --   --  3.1   GFR: Estimated Creatinine Clearance: 29.6 mL/min (A) (by C-G formula based on SCr of 1.77 mg/dL (H)). Liver Function Tests: Recent Labs  Lab 04/04/21 1026 04/05/21 0614 04/06/21 0536  AST 178* 113* 82*  ALT 79* 52* 45*  ALKPHOS 115 90 90   BILITOT 0.5 0.7 0.7  PROT 7.2 5.4* 5.8*  ALBUMIN 1.9* 1.5* 1.6*   No results for input(s): LIPASE, AMYLASE in the last 168 hours. No results for input(s): AMMONIA in the last 168 hours. Coagulation Profile: Recent Labs  Lab 04/06/21 0536  INR 1.3*   Cardiac Enzymes: No results for input(s): CKTOTAL, CKMB, CKMBINDEX, TROPONINI in the last 168 hours. BNP (last 3 results) No results for input(s): PROBNP in the last 8760 hours. HbA1C: No results for input(s): HGBA1C in the last 72 hours. CBG: No results for input(s): GLUCAP in the last 168 hours. Lipid Profile: No results for  input(s): CHOL, HDL, LDLCALC, TRIG, CHOLHDL, LDLDIRECT in the last 72 hours. Thyroid Function Tests: No results for input(s): TSH, T4TOTAL, FREET4, T3FREE, THYROIDAB in the last 72 hours. Anemia Panel: Recent Labs    04/05/21 2041 04/06/21 0536  FERRITIN 846* 881*   876*  TIBC  --  133*  IRON  --  55   Sepsis Labs: No results for input(s): PROCALCITON, LATICACIDVEN in the last 168 hours.  Recent Results (from the past 240 hour(s))  Resp Panel by RT-PCR (Flu A&B, Covid) Nasopharyngeal Swab     Status: None   Collection Time: 04/05/21 10:02 PM   Specimen: Nasopharyngeal Swab; Nasopharyngeal(NP) swabs in vial transport medium  Result Value Ref Range Status   SARS Coronavirus 2 by RT PCR NEGATIVE NEGATIVE Final    Comment: (NOTE) SARS-CoV-2 target nucleic acids are NOT DETECTED.  The SARS-CoV-2 RNA is generally detectable in upper respiratory specimens during the acute phase of infection. The lowest concentration of SARS-CoV-2 viral copies this assay can detect is 138 copies/mL. A negative result does not preclude SARS-Cov-2 infection and should not be used as the sole basis for treatment or other patient management decisions. A negative result may occur with  improper specimen collection/handling, submission of specimen other than nasopharyngeal swab, presence of viral mutation(s) within the areas  targeted by this assay, and inadequate number of viral copies(<138 copies/mL). A negative result must be combined with clinical observations, patient history, and epidemiological information. The expected result is Negative.  Fact Sheet for Patients:  EntrepreneurPulse.com.au  Fact Sheet for Healthcare Providers:  IncredibleEmployment.be  This test is no t yet approved or cleared by the [place] FDA and  has been authorized for detection and/or diagnosis of SARS-CoV-2 by FDA under an Emergency Use Authorization (EUA). This EUA will remain  in effect (meaning this test can be used) for the duration of the COVID-19 declaration under Section 564(b)(1) of the Act, 21 U.S.C.section 360bbb-3(b)(1), unless the authorization is terminated  or revoked sooner.       Influenza A by PCR NEGATIVE NEGATIVE Final   Influenza B by PCR NEGATIVE NEGATIVE Final    Comment: (NOTE) The Xpert Xpress SARS-CoV-2/FLU/RSV plus assay is intended as an aid in the diagnosis of influenza from Nasopharyngeal swab specimens and should not be used as a sole basis for treatment. Nasal washings and aspirates are unacceptable for Xpert Xpress SARS-CoV-2/FLU/RSV testing.  Fact Sheet for Patients: EntrepreneurPulse.com.au  Fact Sheet for Healthcare Providers: IncredibleEmployment.be  This test is not yet approved or cleared by the [place] FDA and has been authorized for detection and/or diagnosis of SARS-CoV-2 by FDA under an Emergency Use Authorization (EUA). This EUA will remain in effect (meaning this test can be used) for the duration of the COVID-19 declaration under Section 564(b)(1) of the Act, 21 U.S.C. section 360bbb-3(b)(1), unless the authorization is terminated or revoked.  Performed at [facility], [place] [address] [place], [place] [address]          Radiology Studies: US Abdomen  Complete  Result Date: 04/05/2021 CLINICAL DATA:  Elevated LFTs. EXAM: ABDOMEN ULTRASOUND COMPLETE COMPARISON:  CT chest, abdomen and pelvis 03/20/2021 FINDINGS: Gallbladder: The gallbladder wall appears mildly thickened measuring 4 mm. No gallstones, pericholecystic fluid or sonographic Murphy's sign. Common bile duct: Diameter: 4 mm Liver: The liver has a coarsened echotexture. Contour of the liver appears irregular and nodular. Portal vein is patent on color Doppler imaging with normal direction of blood flow towards the liver. IVC:  Diminished exam detail.  No abnormality visualized. Pancreas: Diminished exam detail.  Visualized portion unremarkable. Spleen: Size and appearance within normal limits. Right Kidney: Length: 10 cm. Within the interpolar portions of the right kidney there is a cyst measuring 2.1 x 2.1 x 2.0 cm. Echogenicity within normal limits. No mass or hydronephrosis visualized. Left Kidney: Length: 9.4 cm. Echogenicity within normal limits. No mass or hydronephrosis visualized. Abdominal aorta: No aneurysm visualized. Other findings: Markedly diminished exam detail secondary to patient body habitus and overlying bowel gas. IMPRESSION: 1. Markedly diminished exam detail secondary to patient body habitus and overlying bowel gas. 2. Morphologic features of the liver suggestive of cirrhosis. 3. Gallbladder wall thickening. No gallstones, pericholecystic fluid or sonographic Murphy's sign noted. Electronically Signed   By: Kerby Moors M.D.   On: 04/05/2021 06:49   DG Chest Port 1 View  Result Date: 04/04/2021 CLINICAL DATA:  Dysphagia EXAM: PORTABLE CHEST 1 VIEW COMPARISON:  Chest x-ray 03/25/2021 FINDINGS: Heart is enlarged. Mediastinum appears stable. Calcified plaques in the aortic arch. Cardiac surgical changes and median sternotomy wires. Pulmonary vasculature is within normal limits. No focal consolidation identified. No pleural effusion or pneumothorax. IMPRESSION: Cardiomegaly with no  acute process identified. Electronically Signed   By: Ofilia Neas M.D.   On: 04/04/2021 10:49        Scheduled Meds:  levothyroxine  25 mcg Oral Q0600   melatonin  3 mg Oral QHS   pantoprazole (PROTONIX) IV  40 mg Intravenous Q12H   rOPINIRole  0.25 mg Oral BID   Continuous Infusions:  sodium chloride 75 mL/hr at 04/06/21 0507   heparin 1,100 Units/hr (04/06/21 0805)     LOS: 1 day    Time spent:40 min    Jurnee Nakayama, Geraldo Docker, MD [facility]   If 7PM-7AM, please contact night-coverage 04/06/2021, 8:55 AM

## 2021-04-05 NOTE — Progress Notes (Signed)
[facility] for IV Heparin Indication: atrial fibrillation  Allergies  Allergen Reactions   Percocet [Oxycodone-Acetaminophen] Itching and Nausea Only    Patient Measurements: Height: 5\' 6"  (167.6 cm) Weight: 79 kg (174 lb 2.6 oz) IBW/kg (Calculated) : 63.8 Heparin Dosing Weight: TBW  Vital Signs: Temp: 97.7 F (36.5 C) (01/21 0150) Temp Source: Oral (01/21 0150) BP: 110/56 (01/21 0150) Pulse Rate: 88 (01/21 0150)  Labs: Recent Labs    04/04/21 1026 04/04/21 1032 04/04/21 2255  HGB 10.5*  --   --   HCT 32.7*  --   --   PLT 275  --   --   APTT  --  43* 100*  HEPARINUNFRC  --  >1.10*  --   CREATININE 2.52*  --   --      Estimated Creatinine Clearance: 20.8 mL/min (A) (by C-G formula based on SCr of 2.52 mg/dL (H)).   Medical History: Past Medical History:  Diagnosis Date   Acute respiratory failure ([place])    Appendicitis with abscess 03/31/2011   Arthritis    CAD (coronary artery disease),CABG 1993-LIMA to the LAD, SVG to Om and SVG to PDA/PLA, patent on cath 2008. 2003   a. s/p CABG 2003. b. last cath 2008 with patent grafts.   Chronic atrial fibrillation (HCC)    permanent   Chronic systolic CHF (congestive heart failure) ([place])    a. Prior low EF, later normalized.   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 07/2015 - after holding Xarelto x 3 days for spinal procedure.   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 felt vasovagal in setting of pain from renal infarct.    Medications:  Medications Prior to Admission  Medication Sig  Dispense Refill Last Dose   acetaminophen (TYLENOL) 500 MG tablet Take 500 mg by mouth every 6 (six) hours as needed for moderate pain.   04/03/2021   apixaban (ELIQUIS) 2.5 MG TABS tablet TAKE 1 TABLET(2.5 MG) BY MOUTH TWICE DAILY 180 tablet 2 04/03/2021 at 9:00   carvedilol (COREG) 3.125 MG tablet Take 1 tablet (3.125 mg total) by mouth 2 (two) times daily with a meal. TAKE 1 TABLET(3.125 MG) BY MOUTH TWICE DAILY WITH A MEAL 180 tablet 1 04/03/2021   ezetimibe (ZETIA) 10 MG tablet TAKE 1 TABLET(10 MG) BY MOUTH DAILY 90 tablet 1 04/03/2021   levothyroxine (SYNTHROID) 25 MCG tablet TAKE 1 TABLET(25 MCG) BY MOUTH DAILY BEFORE BREAKFAST 90 tablet 1 04/03/2021   lisinopril (ZESTRIL) 5 MG tablet TAKE 1 TABLET(5 MG) BY MOUTH DAILY 90 tablet 3 04/03/2021   pantoprazole (PROTONIX) 40 MG tablet TAKE 1 TABLET(40 MG) BY MOUTH TWICE DAILY 180 tablet 2 04/03/2021   rOPINIRole (REQUIP) 0.25 MG tablet TAKE 1 TABLET(0.25 MG) BY MOUTH TWICE DAILY 180 tablet 1 04/03/2021   rosuvastatin (CRESTOR) 40 MG tablet TAKE 1 TABLET(40 MG) BY MOUTH DAILY 90 tablet 1 04/03/2021   tiZANidine (ZANAFLEX) 4 MG tablet TAKE 1 TABLET(4 MG) BY MOUTH EVERY 8 HOURS AS  NEEDED FOR MUSCLE SPASMS. DO NOT COMBINE WITH TRAMADOL 60 tablet 1 04/03/2021   furosemide (LASIX) 40 MG tablet Take 20 mg by mouth. (Patient not taking: Reported on 04/04/2021)   Not Taking   traMADol (ULTRAM) 50 MG tablet Take 1 tablet (50 mg total) by mouth every 6 (six) hours as needed. 15 tablet 0 04/01/2021    Assessment: Pharmacy is consulted to dose heparin in 88 yo male with PMH of atrial fibrillation. Pt on apixaban 2.5 mg PO BID. PO med on hold in anticipation of EGD with GI team. Last dose of apixaban was 04/03/21 @ 0900. Baseline labs: CBC- Hg slightly low at 10.5, pltc WNL, Scr elevated at 2.52, aptt 43sec, HL>1.10 due to recent apixaban use.   04/05/2021: Aptt 100 sec- therapeutic on 1200 units/hr No bleeding or infusion related concerns per RN  Goal of Therapy:   Heparin level 0.3-0.7 units/ml Monitor platelets by anticoagulation protocol: Yes Aptt 66-102 sec   Plan:  Continue heparin 1200 units/hr Continue to use aptt to monitor heparin until apixaban has cleared/ heparin level correlates with aptt Daily heparin level & CBC while on heparin Monitor for signs and symptoms of bleeding  Netta Cedars, PharmD, BCPS 04/05/2021 3:40 AM

## 2021-04-05 NOTE — Progress Notes (Signed)
ANTICOAGULATION CONSULT NOTE - Follow Up Consult  Pharmacy Consult for heparin  Indication: hx atrial fibrillation and splenic infarct (home Eliquis on hold)  Allergies  Allergen Reactions   Percocet [Oxycodone-Acetaminophen] Itching and Nausea Only    Patient Measurements: Height: 5\' 6"  (167.6 cm) Weight: 79 kg (174 lb 2.6 oz) IBW/kg (Calculated) : 63.8 Heparin Dosing Weight: 79 kg  Vital Signs: Temp: 98.1 F (36.7 C) (01/21 0416) Temp Source: Oral (01/21 0416) BP: 112/41 (01/21 0416) Pulse Rate: 84 (01/21 0416)  Labs: Recent Labs    04/04/21 1026 04/04/21 1032 04/04/21 2255 04/05/21 0614  HGB 10.5*  --   --  8.9*  HCT 32.7*  --   --  28.5*  PLT 275  --   --  280  APTT  --  43* 100*  --   HEPARINUNFRC  --  >1.10*  --  >1.10*  CREATININE 2.52*  --   --  1.99*    Estimated Creatinine Clearance: 26.3 mL/min (A) (by C-G formula based on SCr of 1.99 mg/dL (H)).   Medications:  - on Eliquis 2.5 mg bid PTA (last dose taken on 04/03/21 at 0900)  Assessment: Patient is an 88 y.o M with hx afib and splenic infarct on Eliquis PTA who was instructed to go to the ED on 04/04/21 by his gastroenterologist for further workup of worsening of dysphagia with limited ability to swallow liquids, and severe odynophagia. Eliquis was switched to heparin drip on admission in anticipation for EGD with esophageal dilation.  - GI team's note on 1/20 recom to "hold for at least 6 hours prior to EGD with dilation"  Today, 04/05/2021: - heparin level is elevated at >1.10 but this is from residual effect of Eliquis. At this time, will adjust heparin level using aPTT. aPTT is therapeutic at 93 secs - hgb down 8.9, plts wnl - no bleeding documented - scr elevated at 1.99   Goal of Therapy:  Heparin level 0.3-0.7 units/ml aPTT 66-102 seconds Monitor platelets by anticoagulation protocol: Yes   Plan:  - continue heparin drip at 1200 units/hr - daily heparin level and aPTT - monitor for s/sx  bleeding - Please let us know when patient is going for EDG and when heparin drip needs to be held for procedure  Mihran Lebarron P 04/05/2021,8:05 AM

## 2021-04-05 NOTE — Progress Notes (Signed)
PT Cancellation Note  Patient Details Name: Gregory [name] MRN: [id number] DOB: [birth year]   Cancelled Treatment:       Reason Eval/Treat Not Completed: Medical issues which prohibited therapy Patient was within 24 hour window for initiation of heparin on this date with most recent Heparin level being 1.10 which is out of therapeutic scope. Will follow.   [name] 04/05/2021, 4:12 PM

## 2021-04-05 NOTE — Progress Notes (Signed)
OT Cancellation Note  Patient Details Name: Gregory [name] MRN: [id number] DOB: May 10, 1932   Cancelled Treatment:    Reason Eval/Treat Not Completed: Medical issues which prohibited therapy Patient was within 24 hour window for initiation of heparin on this date with most recent Heparin level being 1.10 which is out of theraputic scope. OT to continue to follow and check back as schedule will allow. Jackelyn Poling OTR/L, [facility] Office# [phone] Pager# [phone]   04/05/2021, 3:51 PM

## 2021-04-05 NOTE — Progress Notes (Addendum)
Subjective: No new complaints.  Objective: Vital signs in last 24 hours: Temp:  [97.4 F (36.3 C)-98.1 F (36.7 C)] 98.1 F (36.7 C) (01/21 0416) Pulse Rate:  [51-88] 84 (01/21 0416) Resp:  [11-22] 16 (01/21 0416) BP: (81-129)/(39-96) 112/41 (01/21 0416) SpO2:  [92 %-100 %] 92 % (01/21 0416) Last BM Date: 04/04/21  Intake/Output from previous day: 01/20 0701 - 01/21 0700 In: 575.6 [I.V.:575.6] Out: 1250 [Urine:1250] Intake/Output this shift: No intake/output data recorded.  General appearance: alert and no distress GI: soft, non-tender; bowel sounds normal; no masses,  no organomegaly  Lab Results: Recent Labs    04/04/21 1026 04/05/21 0614  WBC 9.1 8.2  HGB 10.5* 8.9*  HCT 32.7* 28.5*  PLT 275 280   BMET Recent Labs    04/04/21 1026 04/05/21 0614  NA 133* 134*  K 4.2 4.1  CL 96* 102  CO2 28 23  GLUCOSE 108* 57*  BUN 55* 40*  CREATININE 2.52* 1.99*  CALCIUM 8.1* 7.4*   LFT Recent Labs    04/05/21 0614  PROT 5.4*  ALBUMIN 1.5*  AST 113*  ALT 52*  ALKPHOS 90  BILITOT 0.7   PT/INR No results for input(s): LABPROT, INR in the last 72 hours. Hepatitis Panel No results for input(s): HEPBSAG, HCVAB, HEPAIGM, HEPBIGM in the last 72 hours. C-Diff No results for input(s): CDIFFTOX in the last 72 hours. Fecal Lactopherrin No results for input(s): FECLLACTOFRN in the last 72 hours.  Studies/Results: US Abdomen Complete  Result Date: 04/05/2021 CLINICAL DATA:  Elevated LFTs. EXAM: ABDOMEN ULTRASOUND COMPLETE COMPARISON:  CT chest, abdomen and pelvis 03/20/2021 FINDINGS: Gallbladder: The gallbladder wall appears mildly thickened measuring 4 mm. No gallstones, pericholecystic fluid or sonographic Murphy's sign. Common bile duct: Diameter: 4 mm Liver: The liver has a coarsened echotexture. Contour of the liver appears irregular and nodular. Portal vein is patent on color Doppler imaging with normal direction of blood flow towards the liver. IVC: Diminished exam  detail.  No abnormality visualized. Pancreas: Diminished exam detail.  Visualized portion unremarkable. Spleen: Size and appearance within normal limits. Right Kidney: Length: 10 cm. Within the interpolar portions of the right kidney there is a cyst measuring 2.1 x 2.1 x 2.0 cm. Echogenicity within normal limits. No mass or hydronephrosis visualized. Left Kidney: Length: 9.4 cm. Echogenicity within normal limits. No mass or hydronephrosis visualized. Abdominal aorta: No aneurysm visualized. Other findings: Markedly diminished exam detail secondary to patient body habitus and overlying bowel gas. IMPRESSION: 1. Markedly diminished exam detail secondary to patient body habitus and overlying bowel gas. 2. Morphologic features of the liver suggestive of cirrhosis. 3. Gallbladder wall thickening. No gallstones, pericholecystic fluid or sonographic Murphy's sign noted. Electronically Signed   By: Kerby Moors M.D.   On: 04/05/2021 06:49   DG Chest Port 1 View  Result Date: 04/04/2021 CLINICAL DATA:  Dysphagia EXAM: PORTABLE CHEST 1 VIEW COMPARISON:  Chest x-ray 03/25/2021 FINDINGS: Heart is enlarged. Mediastinum appears stable. Calcified plaques in the aortic arch. Cardiac surgical changes and median sternotomy wires. Pulmonary vasculature is within normal limits. No focal consolidation identified. No pleural effusion or pneumothorax. IMPRESSION: Cardiomegaly with no acute process identified. Electronically Signed   By: Ofilia Neas M.D.   On: 04/04/2021 10:49    Medications: Scheduled:  levothyroxine  25 mcg Oral Q0600   melatonin  3 mg Oral QHS   pantoprazole (PROTONIX) IV  40 mg Intravenous Q12H   rOPINIRole  0.25 mg Oral BID   Continuous:  sodium  chloride 75 mL/hr at 04/04/21 1825   heparin 1,200 Units/hr (04/04/21 1503)    Assessment/Plan: 1) Dysphagia. 2) History of esophageal strictures. 3) New diagnosis of cirrhosis.   The patient is stable.  The last time he took Eliquis was Thursday.   An EGD will be performed tomorrow, however, it is not clear if he will be able to undergo a dilation with the new findings of cirrhosis.  If he has esophageal varices, dilation will not be performed.  If he does not have overt varices, a very slow can careful dilation can be performed.  This was discussed in detail with the patient and his family.  The etiology of his cirrhosis not not known.  He stopped drinking ETOH in the 1980's and he reports remaining thin since 2008.  His liver enzymes are consistent with cirrhosis in a 2:1 ratio.  Plan: 1) EGD with possible dilation tomorrow. 2) Check for HCV, HBV, alpha-1-antitrypsin, iron panel, ferritin, ANA, ASMA, and AMA.    LOS: 0 days   Jesseca Marsch D 04/05/2021, 11:13 AM

## 2021-04-06 DIAGNOSIS — R748 Abnormal levels of other serum enzymes: Secondary | ICD-10-CM | POA: Diagnosis present

## 2021-04-06 DIAGNOSIS — G2581 Restless legs syndrome: Secondary | ICD-10-CM | POA: Diagnosis present

## 2021-04-06 DIAGNOSIS — E782 Mixed hyperlipidemia: Secondary | ICD-10-CM | POA: Diagnosis present

## 2021-04-06 DIAGNOSIS — I5032 Chronic diastolic (congestive) heart failure: Secondary | ICD-10-CM | POA: Diagnosis present

## 2021-04-06 DIAGNOSIS — N179 Acute kidney failure, unspecified: Secondary | ICD-10-CM | POA: Diagnosis present

## 2021-04-06 DIAGNOSIS — K7469 Other cirrhosis of liver: Secondary | ICD-10-CM

## 2021-04-06 DIAGNOSIS — I10 Essential (primary) hypertension: Secondary | ICD-10-CM | POA: Diagnosis present

## 2021-04-06 DIAGNOSIS — E039 Hypothyroidism, unspecified: Secondary | ICD-10-CM | POA: Diagnosis present

## 2021-04-06 DIAGNOSIS — R627 Adult failure to thrive: Secondary | ICD-10-CM | POA: Diagnosis present

## 2021-04-06 DIAGNOSIS — E785 Hyperlipidemia, unspecified: Secondary | ICD-10-CM | POA: Diagnosis present

## 2021-04-06 DIAGNOSIS — D649 Anemia, unspecified: Secondary | ICD-10-CM | POA: Diagnosis present

## 2021-04-06 LAB — COMPREHENSIVE METABOLIC PANEL
ALT: 45 U/L — ABNORMAL HIGH (ref 0–44)
AST: 82 U/L — ABNORMAL HIGH (ref 15–41)
Albumin: 1.6 g/dL — ABNORMAL LOW (ref 3.5–5.0)
Alkaline Phosphatase: 90 U/L (ref 38–126)
Anion gap: 7 (ref 5–15)
BUN: 32 mg/dL — ABNORMAL HIGH (ref 8–23)
CO2: 25 mmol/L (ref 22–32)
Calcium: 7.8 mg/dL — ABNORMAL LOW (ref 8.9–10.3)
Chloride: 102 mmol/L (ref 98–111)
Creatinine, Ser: 1.77 mg/dL — ABNORMAL HIGH (ref 0.61–1.24)
GFR, Estimated: 37 mL/min — ABNORMAL LOW (ref >60)
Glucose, Bld: 92 mg/dL (ref 70–99)
Potassium: 3.9 mmol/L (ref 3.5–5.1)
Sodium: 134 mmol/L — ABNORMAL LOW (ref 135–145)
Total Bilirubin: 0.7 mg/dL (ref 0.3–1.2)
Total Protein: 5.8 g/dL — ABNORMAL LOW (ref 6.5–8.1)

## 2021-04-06 LAB — CBC WITH DIFFERENTIAL/PLATELET
Abs Immature Granulocytes: 0.04 10*3/uL (ref 0.00–0.07)
Basophils Absolute: 0 10*3/uL (ref 0.0–0.1)
Basophils Relative: 0 %
Eosinophils Absolute: 0 10*3/uL (ref 0.0–0.5)
Eosinophils Relative: 0 %
HCT: 30.5 % — ABNORMAL LOW (ref 39.0–52.0)
Hemoglobin: 9.7 g/dL — ABNORMAL LOW (ref 13.0–17.0)
Immature Granulocytes: 0 %
Lymphocytes Relative: 14 %
Lymphs Abs: 1.3 10*3/uL (ref 0.7–4.0)
MCH: 30.9 pg (ref 26.0–34.0)
MCHC: 31.8 g/dL (ref 30.0–36.0)
MCV: 97.1 fL (ref 80.0–100.0)
Monocytes Absolute: 0.7 10*3/uL (ref 0.1–1.0)
Monocytes Relative: 7 %
Neutro Abs: 7.4 10*3/uL (ref 1.7–7.7)
Neutrophils Relative %: 79 %
Platelets: 294 10*3/uL (ref 150–400)
RBC: 3.14 MIL/uL — ABNORMAL LOW (ref 4.22–5.81)
RDW: 14.1 % (ref 11.5–15.5)
WBC: 9.5 10*3/uL (ref 4.0–10.5)
nRBC: 0 % (ref 0.0–0.2)

## 2021-04-06 LAB — IRON AND TIBC
Iron: 55 ug/dL (ref 45–182)
Saturation Ratios: 42 % — ABNORMAL HIGH (ref 17.9–39.5)
TIBC: 133 ug/dL — ABNORMAL LOW (ref 250–450)
UIBC: 78 ug/dL

## 2021-04-06 LAB — RESP PANEL BY RT-PCR (FLU A&B, COVID) ARPGX2
Influenza A by PCR: NEGATIVE
Influenza B by PCR: NEGATIVE
SARS Coronavirus 2 by RT PCR: NEGATIVE

## 2021-04-06 LAB — FERRITIN
Ferritin: 876 ng/mL — ABNORMAL HIGH (ref 24–336)
Ferritin: 881 ng/mL — ABNORMAL HIGH (ref 24–336)

## 2021-04-06 LAB — PROTIME-INR
INR: 1.3 — ABNORMAL HIGH (ref 0.8–1.2)
Prothrombin Time: 16.4 seconds — ABNORMAL HIGH (ref 11.4–15.2)

## 2021-04-06 LAB — HEPARIN LEVEL (UNFRACTIONATED)
Heparin Unfractionated: 0.84 IU/mL — ABNORMAL HIGH (ref 0.30–0.70)
Heparin Unfractionated: 0.66 IU/mL (ref 0.30–0.70)

## 2021-04-06 LAB — SEDIMENTATION RATE: Sed Rate: 138 mm/hr — ABNORMAL HIGH (ref 0–16)

## 2021-04-06 LAB — C-REACTIVE PROTEIN
CRP: 16.9 mg/dL — ABNORMAL HIGH (ref <1.0)
CRP: 15.6 mg/dL — ABNORMAL HIGH (ref <1.0)

## 2021-04-06 LAB — HEPATITIS B SURFACE ANTIGEN: Hepatitis B Surface Ag: NONREACTIVE

## 2021-04-06 LAB — D-DIMER, QUANTITATIVE: D-Dimer, Quant: 2.92 ug/mL-FEU — ABNORMAL HIGH (ref 0.00–0.50)

## 2021-04-06 LAB — LACTATE DEHYDROGENASE: LDH: 132 U/L (ref 98–192)

## 2021-04-06 LAB — HEPATITIS C ANTIBODY: HCV Ab: NONREACTIVE

## 2021-04-06 LAB — APTT
aPTT: 106 seconds — ABNORMAL HIGH (ref 24–36)
aPTT: 137 seconds — ABNORMAL HIGH (ref 24–36)

## 2021-04-06 LAB — MAGNESIUM: Magnesium: 1.6 mg/dL — ABNORMAL LOW (ref 1.7–2.4)

## 2021-04-06 LAB — PHOSPHORUS: Phosphorus: 3.1 mg/dL (ref 2.5–4.6)

## 2021-04-06 LAB — HEPATITIS B SURFACE ANTIBODY,QUALITATIVE: Hep B S Ab: NONREACTIVE

## 2021-04-06 MED ORDER — MAGNESIUM SULFATE 2 GM/50ML IV SOLN
2.0000 g | Freq: Once | INTRAVENOUS | Status: AC
  Administered 2021-04-06: 2 g via INTRAVENOUS
  Filled 2021-04-06: qty 50

## 2021-04-06 MED ORDER — ALBUMIN HUMAN 25 % IV SOLN
50.0000 g | Freq: Once | INTRAVENOUS | Status: AC
  Administered 2021-04-06: 50 g via INTRAVENOUS
  Filled 2021-04-06: qty 200

## 2021-04-06 NOTE — Progress Notes (Signed)
OT Cancellation Note  Patient Details Name: Gregory [name] MRN: [id number] DOB: 21-Dec-1932   Cancelled Treatment:    Reason Eval/Treat Not Completed: Medical issues which prohibited therapy Patient's Heparin level remains supratheraputic at this time. OT will continue to follow and check back as schedule will allow.  Jackelyn Poling OTR/L, [facility] Department Office# [phone] Pager# [phone]   04/06/2021, 3:28 PM

## 2021-04-06 NOTE — Progress Notes (Signed)
ANTICOAGULATION CONSULT NOTE - Follow Up Consult  Pharmacy Consult for heparin  Indication: hx atrial fibrillation and splenic infarct (home Eliquis on hold)  Allergies  Allergen Reactions   Percocet [Oxycodone-Acetaminophen] Itching and Nausea Only    Patient Measurements: Height: 5\' 6"  (167.6 cm) Weight: 79 kg (174 lb 2.6 oz) IBW/kg (Calculated) : 63.8 Heparin Dosing Weight: 79 kg  Vital Signs: Temp: 98.3 F (36.8 C) (01/22 1422) Temp Source: Oral (01/22 1422) BP: 118/75 (01/22 1422) Pulse Rate: 103 (01/22 1422)  Labs: Recent Labs    04/04/21 1026 04/04/21 1032 04/05/21 0614 04/06/21 0536 04/06/21 1622  HGB 10.5*  --  8.9* 9.7*  --   HCT 32.7*  --  28.5* 30.5*  --   PLT 275  --  280 294  --   APTT  --    < > 93* 106* 137*  LABPROT  --   --   --  16.4*  --   INR  --   --   --  1.3*  --   HEPARINUNFRC  --    < > >1.10* 0.84* 0.66  CREATININE 2.52*  --  1.99* 1.77*  --    < > = values in this interval not displayed.     Estimated Creatinine Clearance: 29.6 mL/min (A) (by C-G formula based on SCr of 1.77 mg/dL (H)).   Medications:  - on Eliquis 2.5 mg bid PTA (last dose taken on 04/03/21 at 0900)  Assessment: Patient is an 88 y.o M with hx afib and splenic infarct on Eliquis PTA who was instructed to go to the ED on 04/04/21 by his gastroenterologist for further workup of worsening of dysphagia with limited ability to swallow liquids, and severe odynophagia. Eliquis was switched to heparin drip on admission in anticipation for EGD with possible esophageal dilation.  - 1/21 [place] abd: Morphologic features of the liver suggestive of cirrhosis  Today, 04/06/2021: - heparin level is elevated at 0.84, but this is from residual effect of Eliquis. At this time, will adjust heparin level using aPTT. aPTT is slightly supra-therapeutic at 106 secs - hgb 9.7, plts wnl - no bleeding documented - scr elevated but trending down  - GI team's note on 1/20 recom to "hold for at  least 6 hours prior to EGD with dilation" - Per Dr. Benson Norway, rescheduling EGD for 8AM on 04/07/21 since heparin drip wasn't turned off over night. He recom to stop heparin drip at 2AM on 04/07/21.  PM: heparin level 0.66 in therapeutic range and trending down as effects of apixaban wear off.  aPTT elevated at 137 despite heparin rate decrease.  Will base therapeutic effect of heparin on heparin level at this point.  Goal of Therapy:  Heparin level 0.3-0.7 units/ml aPTT 66-102 seconds Monitor platelets by anticoagulation protocol: Yes   Plan:  - Continue heparin drip at 1100 units/hr. Will d/c heparin drip at 2AM on 1/23 for EGD procedure at 8AM - Follow up plans for resume anticoagulation after procedure - monitor for s/sx bleeding   Peggyann Juba, PharmD, BCPS Pharmacy: [phone] 04/06/2021,7:02 PM

## 2021-04-06 NOTE — Progress Notes (Signed)
PT Cancellation Note  Patient Details Name: JONAVON TRIEU MRN: [id number] DOB: [birth year]   Cancelled Treatment:     PT deferred this date, heparin remains supra-therapeutic.  Will follow.   Carmel Garfield 04/06/2021, 4:33 PM

## 2021-04-06 NOTE — Progress Notes (Signed)
ANTICOAGULATION CONSULT NOTE - Follow Up Consult  Pharmacy Consult for heparin  Indication: hx atrial fibrillation and splenic infarct (home Eliquis on hold)  Allergies  Allergen Reactions   Percocet [Oxycodone-Acetaminophen] Itching and Nausea Only    Patient Measurements: Height: 5\' 6"  (167.6 cm) Weight: 79 kg (174 lb 2.6 oz) IBW/kg (Calculated) : 63.8 Heparin Dosing Weight: 79 kg  Vital Signs: Temp: 97.5 F (36.4 C) (01/22 0421) Temp Source: Oral (01/22 0421) BP: 111/46 (01/22 0421) Pulse Rate: 84 (01/22 0421)  Labs: Recent Labs    04/04/21 1026 04/04/21 1032 04/04/21 1032 04/04/21 2255 04/05/21 0614 04/06/21 0536  HGB 10.5*  --   --   --  8.9* 9.7*  HCT 32.7*  --   --   --  28.5* 30.5*  PLT 275  --   --   --  280 294  APTT  --  43*   < > 100* 93* 106*  LABPROT  --   --   --   --   --  16.4*  INR  --   --   --   --   --  1.3*  HEPARINUNFRC  --  >1.10*  --   --  >1.10* 0.84*  CREATININE 2.52*  --   --   --  1.99* 1.77*   < > = values in this interval not displayed.     Estimated Creatinine Clearance: 29.6 mL/min (A) (by C-G formula based on SCr of 1.77 mg/dL (H)).   Medications:  - on Eliquis 2.5 mg bid PTA (last dose taken on 04/03/21 at 0900)  Assessment: Patient is an 88 y.o M with hx afib and splenic infarct on Eliquis PTA who was instructed to go to the ED on 04/04/21 by his gastroenterologist for further workup of worsening of dysphagia with limited ability to swallow liquids, and severe odynophagia. Eliquis was switched to heparin drip on admission in anticipation for EGD with possible esophageal dilation.  - 1/21 [place] abd: Morphologic features of the liver suggestive of cirrhosis  Today, 04/06/2021: - heparin level is elevated at 0.84, but this is from residual effect of Eliquis. At this time, will adjust heparin level using aPTT. aPTT is slightly supra-therapeutic at 106 secs - hgb 9.7, plts wnl - no bleeding documented - scr elevated but trending down   - GI team's note on 1/20 recom to "hold for at least 6 hours prior to EGD with dilation" - Per Dr. Benson Norway, rescheduling EGD for 8AM on 04/07/21 since heparin drip wasn't turned off over night. He recom to stop heparin drip at 2AM on 04/07/21.   Goal of Therapy:  Heparin level 0.3-0.7 units/ml aPTT 66-102 seconds Monitor platelets by anticoagulation protocol: Yes   Plan:  - Decrease heparin drip to 1100 units/hr. Will d/c heparin drip at 2AM on 1/23 for EGD procedure at 8AM - check 8 hr heparin level and aPTT  - daily heparin level and aPTT - monitor for s/sx bleeding -    Devin Going, Alif Petrak P 04/06/2021,7:24 AM

## 2021-04-06 NOTE — Progress Notes (Signed)
Subjective: No acute events.  Objective: Vital signs in last 24 hours: Temp:  [97.5 F (36.4 C)-98 F (36.7 C)] 97.5 F (36.4 C) (01/22 0421) Pulse Rate:  [68-84] 84 (01/22 0421) Resp:  [18-20] 20 (01/22 0421) BP: (111-120)/(46-54) 111/46 (01/22 0421) SpO2:  [91 %-96 %] 91 % (01/22 0421) Last BM Date: 04/04/21  Intake/Output from previous day: 01/21 0701 - 01/22 0700 In: 746 [I.V.:746] Out: 600 [Urine:600] Intake/Output this shift: No intake/output data recorded.  General appearance: alert and no distress GI: soft, non-tender; bowel sounds normal; no masses,  no organomegaly  Lab Results: Recent Labs    04/04/21 1026 04/05/21 0614 04/06/21 0536  WBC 9.1 8.2 9.5  HGB 10.5* 8.9* 9.7*  HCT 32.7* 28.5* 30.5*  PLT 275 280 294   BMET Recent Labs    04/04/21 1026 04/05/21 0614 04/06/21 0536  NA 133* 134* 134*  K 4.2 4.1 3.9  CL 96* 102 102  CO2 28 23 25   GLUCOSE 108* 57* 92  BUN 55* 40* 32*  CREATININE 2.52* 1.99* 1.77*  CALCIUM 8.1* 7.4* 7.8*   LFT Recent Labs    04/06/21 0536  PROT 5.8*  ALBUMIN 1.6*  AST 82*  ALT 45*  ALKPHOS 90  BILITOT 0.7   PT/INR Recent Labs    04/06/21 0536  LABPROT 16.4*  INR 1.3*   Hepatitis Panel Recent Labs    04/04/21 1809  HEPBSAG NON REACTIVE  HCVAB NON REACTIVE  HEPAIGM NON REACTIVE  HEPBIGM NON REACTIVE   C-Diff No results for input(s): CDIFFTOX in the last 72 hours. Fecal Lactopherrin No results for input(s): FECLLACTOFRN in the last 72 hours.  Studies/Results: US Abdomen Complete  Result Date: 04/05/2021 CLINICAL DATA:  Elevated LFTs. EXAM: ABDOMEN ULTRASOUND COMPLETE COMPARISON:  CT chest, abdomen and pelvis 03/20/2021 FINDINGS: Gallbladder: The gallbladder wall appears mildly thickened measuring 4 mm. No gallstones, pericholecystic fluid or sonographic Murphy's sign. Common bile duct: Diameter: 4 mm Liver: The liver has a coarsened echotexture. Contour of the liver appears irregular and nodular.  Portal vein is patent on color Doppler imaging with normal direction of blood flow towards the liver. IVC: Diminished exam detail.  No abnormality visualized. Pancreas: Diminished exam detail.  Visualized portion unremarkable. Spleen: Size and appearance within normal limits. Right Kidney: Length: 10 cm. Within the interpolar portions of the right kidney there is a cyst measuring 2.1 x 2.1 x 2.0 cm. Echogenicity within normal limits. No mass or hydronephrosis visualized. Left Kidney: Length: 9.4 cm. Echogenicity within normal limits. No mass or hydronephrosis visualized. Abdominal aorta: No aneurysm visualized. Other findings: Markedly diminished exam detail secondary to patient body habitus and overlying bowel gas. IMPRESSION: 1. Markedly diminished exam detail secondary to patient body habitus and overlying bowel gas. 2. Morphologic features of the liver suggestive of cirrhosis. 3. Gallbladder wall thickening. No gallstones, pericholecystic fluid or sonographic Murphy's sign noted. Electronically Signed   By: Kerby Moors M.D.   On: 04/05/2021 06:49   DG Chest Port 1 View  Result Date: 04/04/2021 CLINICAL DATA:  Dysphagia EXAM: PORTABLE CHEST 1 VIEW COMPARISON:  Chest x-ray 03/25/2021 FINDINGS: Heart is enlarged. Mediastinum appears stable. Calcified plaques in the aortic arch. Cardiac surgical changes and median sternotomy wires. Pulmonary vasculature is within normal limits. No focal consolidation identified. No pleural effusion or pneumothorax. IMPRESSION: Cardiomegaly with no acute process identified. Electronically Signed   By: Ofilia Neas M.D.   On: 04/04/2021 10:49    Medications: Scheduled:  levothyroxine  25 mcg Oral  Q0600   melatonin  3 mg Oral QHS   pantoprazole (PROTONIX) IV  40 mg Intravenous Q12H   rOPINIRole  0.25 mg Oral BID   Continuous:  sodium chloride 75 mL/hr at 04/06/21 0507   heparin 1,100 Units/hr (04/06/21 0805)    Assessment/Plan: 1) Dysphagia. 2) History of  esophageal strictures. 3) Cirrhosis.   There was confusion about his heparin.  The medication was not held for the procedure.  As a result the EGD was cancelled.  The patient is frustrated.  Plan: 1) Hold heparin at 2 AM on 04/07/2021. 2) Anticipate EGD tomorrow at 8 AM with Dr. Tarri Glenn. 3) Follow up on hepatic serologies for cirrhosis work up.  LOS: 1 day   Reyonna Haack D 04/06/2021, 8:53 AM

## 2021-04-06 NOTE — Progress Notes (Signed)
Patient EGD was cancelled for Sunday; Heparin not stopped.

## 2021-04-06 NOTE — Progress Notes (Signed)
PROGRESS NOTE    LAWYER WASHABAUGH  TKZ:[id number] DOB: 1932/05/11 DOA: 04/04/2021 PCP: Darreld Mclean, MD     Brief Narrative:   88 y.o. WM PMHx A fib, HTN, CAD, Chronic Diastolic CHF, restless leg syndrome (RLS),   Presenting with dysphagia. He has had swallowing issue for several months. At first it was just with solids; but now it has progressed to liquids as well. He is still able to take his pills. Understandably during this time, he has progressively felt weaker and has had increased difficulty with mobility. He was seen in GI clinic this morning and it was recommended that he come to the ED for evaluation.    ED Course: [facility] was consulted. He was given fluids. [facility] was called for admission.    Subjective: 1/22 afebrile overnight A/O x4, patient upset that he did not receive his EGD today    A/O x4, patient understands plan for the a.m. is to have EGD secondary to heparin not being turned off on time as instructed by GI.   Assessment & Plan: Covid vaccination;   Principal Problem:   Dysphagia Active Problems:   Chronic a-fib (HCC)   Chronic diastolic CHF (congestive heart failure) (HCC)   Essential hypertension   HLD (hyperlipidemia)   Elevated liver enzymes   Acute renal failure superimposed on stage 3a chronic kidney disease (HCC)   Anemia, unspecified   Hypothyroidism   Restless leg syndrome   Failure to thrive in adult   Other cirrhosis of liver ([place])    Dysphagia -Per Dr. Carol Ada, GI,EGD will be performed tomorrow, however, it is not clear if he will be able to undergo a dilation with the new findings of cirrhosis.  If he has esophageal varices, dilation will not be performed. - Acute hepatitis panel negative, alpha-1-antitrypsin, iron panel, ferritin, ANA, ASMA, and AMA pending.   -1/22 per GI Anticipate EGD tomorrow at 8 AM with Dr. Tarri Glenn Liver cirrhosis - See US abdomen below - Per patient does not drink use drugs  Chronic diastolic  CHF -Strict in and out -928.59ml - Daily weight Filed Weights   04/04/21 1000  Weight: 79 kg     A. fib - On Eliquis (hold) for surgery -Heparin per pharmacy until 2400 -Currently NSR - BP control currently without medication  Essential HTN - See A. Fib -1/22 BP somewhat soft. - 1/22 albumin 50 g x 1 - Normal saline 59ml/hr  CAD -See CHF  HLD - Hold statin secondary to elevated LFT  Elevated LFT - Acute hepatitis panel negative - US abdomen RUQ pending - Hold statin  Acute on CKD stage IIIa (baseline Cr1.3-1.5) -Most likely secondary to dehydration Lab Results  Component Value Date   CREATININE 1.77 (H) 04/06/2021   CREATININE 1.99 (H) 04/05/2021   CREATININE 2.52 (H) 04/04/2021   CREATININE 1.56 (H) 03/25/2021   CREATININE 2.14 (H) 03/20/2021  - Will hydrate prior to surgery: Normal saline 59ml/hr   Unspecified anemia - Anemia panel pending - Occult blood pending - DC Eliquis: DC heparin 2400 in preparation for his surgery - Transfuse for hemoglobin <7         GERD    Hypothyroidism - Synthroid 25 mcg daily -TSH pending   Restless leg syndrome -Requip 0.25 mg BID  COVID Positive December 22 -Per [facility] protocol was not tested upon admission, will obtain COVID test per protocol  COVID-19 Labs  Recent Labs    04/05/21 2041 04/06/21 0536  DDIMER 3.90* 2.92*  FERRITIN 846* 881*   876*  LDH 152 132  CRP 16.9* 15.6*    Lab Results  Component Value Date   SARSCOV2NAA NEGATIVE 04/05/2021   SARSCOV2NAA POSITIVE (A) 03/25/2021   SARSCOV2NAA POSITIVE (A) 03/14/2021   SARSCOV2NAA RESULT: NEGATIVE 02/26/2020     Failure to thrive - Generalized weakness - Poor p.o. intake secondary to dysphagia  Hypomagnesmia - Magnesium goal> 2 - Magnesium IV 2g         DVT prophylaxis: Heparin drip Code Status:  Family Communication: 1/22 daughter at bedside for discussion of plan of care all questions answered Status is: Inpatient    Dispo: The  patient is from:               Anticipated d/c is to:               Anticipated d/c date is:               Patient currently       Consultants:  Dr. Carol Ada, GI    Procedures/Significant Events:  1/21 US abdomen complete: -Morphologic features of the liver suggestive of cirrhosis. -Gallbladder wall thickening. No gallstones, pericholecystic fluid or sonographic Murphy's sign noted.  I have personally reviewed and interpreted all radiology studies and my findings are as above.  VENTILATOR SETTINGS:    Cultures 1/20 Acute Hepatitis Panel negative   Antimicrobials:    Devices    LINES / TUBES:      Continuous Infusions:  sodium chloride 75 mL/hr at 04/06/21 0507   heparin 1,100 Units/hr (04/06/21 0805)     Objective: Vitals:   04/05/21 1339 04/05/21 2118 04/06/21 0421 04/06/21 1422  BP: (!) 117/54 (!) 120/54 (!) 111/46 118/75  Pulse: 68 72 84 (!) 103  Resp: 18 20 20 18   Temp: 98 F (36.7 C) (!) 97.5 F (36.4 C) (!) 97.5 F (36.4 C) 98.3 F (36.8 C)  TempSrc: Oral Oral Oral Oral  SpO2: 96% 94% 91% 91%  Weight:      Height:        Intake/Output Summary (Last 24 hours) at 04/06/2021 1703 Last data filed at 04/06/2021 1500 Gross per 24 hour  Intake 746 ml  Output 1000 ml  Net -254 ml   Filed Weights   04/04/21 1000  Weight: 79 kg    Examination:  General: A/O x4 No acute respiratory distress, cachectic Eyes: negative scleral hemorrhage, negative anisocoria, negative icterus ENT: Negative Runny nose, negative gingival bleeding, Neck:  Negative scars, masses, torticollis, lymphadenopathy, JVD Lungs: decreased breath sounds bilaterally without wheezes or crackles Cardiovascular: Regular rate and rhythm without murmur gallop or rub normal S1 and S2 Abdomen: negative abdominal pain, nondistended, positive soft, bowel sounds, no rebound, no ascites, no appreciable mass Extremities: No significant cyanosis, clubbing, or edema bilateral lower  extremities Skin: Negative rashes, lesions, ulcers Psychiatric:  Negative depression, negative anxiety, negative fatigue, negative mania  Central nervous system:  Cranial nerves II through XII intact, tongue/uvula midline, all extremities muscle strength 5/5, sensation intact throughout, negative dysarthria, negative expressive aphasia, negative receptive aphasia.  .     Data Reviewed: Care during the described time interval was provided by me .  I have reviewed this patient's available data, including medical history, events of note, physical examination, and all test results as part of my evaluation.  CBC: Recent Labs  Lab 04/04/21 1026 04/05/21 0614 04/06/21 0536  WBC 9.1 8.2 9.5  NEUTROABS 6.9 6.2 7.4  HGB 10.5* 8.9* 9.7*  HCT 32.7* 28.5* 30.5*  MCV 96.7 96.6 97.1  PLT 275 280 426   Basic Metabolic Panel: Recent Labs  Lab 04/04/21 1026 04/05/21 0614 04/06/21 0536  NA 133* 134* 134*  K 4.2 4.1 3.9  CL 96* 102 102  CO2 28 23 25   GLUCOSE 108* 57* 92  BUN 55* 40* 32*  CREATININE 2.52* 1.99* 1.77*  CALCIUM 8.1* 7.4* 7.8*  MG  --   --  1.6*  PHOS  --   --  3.1   GFR: Estimated Creatinine Clearance: 29.6 mL/min (A) (by C-G formula based on SCr of 1.77 mg/dL (H)). Liver Function Tests: Recent Labs  Lab 04/04/21 1026 04/05/21 0614 04/06/21 0536  AST 178* 113* 82*  ALT 79* 52* 45*  ALKPHOS 115 90 90  BILITOT 0.5 0.7 0.7  PROT 7.2 5.4* 5.8*  ALBUMIN 1.9* 1.5* 1.6*   No results for input(s): LIPASE, AMYLASE in the last 168 hours. No results for input(s): AMMONIA in the last 168 hours. Coagulation Profile: Recent Labs  Lab 04/06/21 0536  INR 1.3*   Cardiac Enzymes: No results for input(s): CKTOTAL, CKMB, CKMBINDEX, TROPONINI in the last 168 hours. BNP (last 3 results) No results for input(s): PROBNP in the last 8760 hours. HbA1C: No results for input(s): HGBA1C in the last 72 hours. CBG: No results for input(s): GLUCAP in the last 168 hours. Lipid  Profile: No results for input(s): CHOL, HDL, LDLCALC, TRIG, CHOLHDL, LDLDIRECT in the last 72 hours. Thyroid Function Tests: No results for input(s): TSH, T4TOTAL, FREET4, T3FREE, THYROIDAB in the last 72 hours. Anemia Panel: Recent Labs    04/05/21 2041 04/06/21 0536  FERRITIN 846* 881*   876*  TIBC  --  133*  IRON  --  55   Sepsis Labs: No results for input(s): PROCALCITON, LATICACIDVEN in the last 168 hours.  Recent Results (from the past 240 hour(s))  Resp Panel by RT-PCR (Flu A&B, Covid) Nasopharyngeal Swab     Status: None   Collection Time: 04/05/21 10:02 PM   Specimen: Nasopharyngeal Swab; Nasopharyngeal(NP) swabs in vial transport medium  Result Value Ref Range Status   SARS Coronavirus 2 by RT PCR NEGATIVE NEGATIVE Final    Comment: (NOTE) SARS-CoV-2 target nucleic acids are NOT DETECTED.  The SARS-CoV-2 RNA is generally detectable in upper respiratory specimens during the acute phase of infection. The lowest concentration of SARS-CoV-2 viral copies this assay can detect is 138 copies/mL. A negative result does not preclude SARS-Cov-2 infection and should not be used as the sole basis for treatment or other patient management decisions. A negative result may occur with  improper specimen collection/handling, submission of specimen other than nasopharyngeal swab, presence of viral mutation(s) within the areas targeted by this assay, and inadequate number of viral copies(<138 copies/mL). A negative result must be combined with clinical observations, patient history, and epidemiological information. The expected result is Negative.  Fact Sheet for Patients:  EntrepreneurPulse.com.au  Fact Sheet for Healthcare Providers:  IncredibleEmployment.be  This test is no t yet approved or cleared by the [place] FDA and  has been authorized for detection and/or diagnosis of SARS-CoV-2 by FDA under an Emergency Use Authorization (EUA).  This EUA will remain  in effect (meaning this test can be used) for the duration of the COVID-19 declaration under Section 564(b)(1) of the Act, 21 U.S.C.section 360bbb-3(b)(1), unless the authorization is terminated  or revoked sooner.       Influenza A by PCR NEGATIVE NEGATIVE Final   Influenza  B by PCR NEGATIVE NEGATIVE Final    Comment: (NOTE) The Xpert Xpress SARS-CoV-2/FLU/RSV plus assay is intended as an aid in the diagnosis of influenza from Nasopharyngeal swab specimens and should not be used as a sole basis for treatment. Nasal washings and aspirates are unacceptable for Xpert Xpress SARS-CoV-2/FLU/RSV testing.  Fact Sheet for Patients: EntrepreneurPulse.com.au  Fact Sheet for Healthcare Providers: IncredibleEmployment.be  This test is not yet approved or cleared by the [place] FDA and has been authorized for detection and/or diagnosis of SARS-CoV-2 by FDA under an Emergency Use Authorization (EUA). This EUA will remain in effect (meaning this test can be used) for the duration of the COVID-19 declaration under Section 564(b)(1) of the Act, 21 U.S.C. section 360bbb-3(b)(1), unless the authorization is terminated or revoked.  Performed at [facility], [place] [address] [place], [place] [address]          Radiology Studies: US Abdomen Complete  Result Date: 04/05/2021 CLINICAL DATA:  Elevated LFTs. EXAM: ABDOMEN ULTRASOUND COMPLETE COMPARISON:  CT chest, abdomen and pelvis 03/20/2021 FINDINGS: Gallbladder: The gallbladder wall appears mildly thickened measuring 4 mm. No gallstones, pericholecystic fluid or sonographic Murphy's sign. Common bile duct: Diameter: 4 mm Liver: The liver has a coarsened echotexture. Contour of the liver appears irregular and nodular. Portal vein is patent on color Doppler imaging with normal direction of blood flow towards the liver. IVC: Diminished exam detail.  No abnormality  visualized. Pancreas: Diminished exam detail.  Visualized portion unremarkable. Spleen: Size and appearance within normal limits. Right Kidney: Length: 10 cm. Within the interpolar portions of the right kidney there is a cyst measuring 2.1 x 2.1 x 2.0 cm. Echogenicity within normal limits. No mass or hydronephrosis visualized. Left Kidney: Length: 9.4 cm. Echogenicity within normal limits. No mass or hydronephrosis visualized. Abdominal aorta: No aneurysm visualized. Other findings: Markedly diminished exam detail secondary to patient body habitus and overlying bowel gas. IMPRESSION: 1. Markedly diminished exam detail secondary to patient body habitus and overlying bowel gas. 2. Morphologic features of the liver suggestive of cirrhosis. 3. Gallbladder wall thickening. No gallstones, pericholecystic fluid or sonographic Murphy's sign noted. Electronically Signed   By: Kerby Moors M.D.   On: 04/05/2021 06:49        Scheduled Meds:  levothyroxine  25 mcg Oral Q0600   melatonin  3 mg Oral QHS   pantoprazole (PROTONIX) IV  40 mg Intravenous Q12H   rOPINIRole  0.25 mg Oral BID   Continuous Infusions:  sodium chloride 75 mL/hr at 04/06/21 0507   heparin 1,100 Units/hr (04/06/21 0805)     LOS: 1 day    Time spent:40 min    Aadvik Roker, Geraldo Docker, MD [facility]   If 7PM-7AM, please contact night-coverage 04/06/2021, 5:03 PM

## 2021-04-07 ENCOUNTER — Inpatient Hospital Stay (HOSPITAL_COMMUNITY): Payer: Medicare Other | Admitting: Anesthesiology

## 2021-04-07 ENCOUNTER — Ambulatory Visit: Payer: Medicare Other | Admitting: Cardiovascular Disease

## 2021-04-07 ENCOUNTER — Encounter (HOSPITAL_COMMUNITY): Payer: Self-pay | Admitting: Internal Medicine

## 2021-04-07 ENCOUNTER — Encounter (HOSPITAL_COMMUNITY)
Admission: EM | Disposition: A | Discharge status: Home Health | Payer: Self-pay | Source: Ambulatory Visit | Attending: Internal Medicine

## 2021-04-07 DIAGNOSIS — K222 Esophageal obstruction: Principal | ICD-10-CM

## 2021-04-07 HISTORY — PX: ESOPHAGOGASTRODUODENOSCOPY (EGD) WITH PROPOFOL: SHX5813

## 2021-04-07 LAB — CBC WITH DIFFERENTIAL/PLATELET
Abs Immature Granulocytes: 0.06 10*3/uL (ref 0.00–0.07)
Basophils Absolute: 0 10*3/uL (ref 0.0–0.1)
Basophils Relative: 0 %
Eosinophils Absolute: 0 10*3/uL (ref 0.0–0.5)
Eosinophils Relative: 0 %
HCT: 26.4 % — ABNORMAL LOW (ref 39.0–52.0)
Hemoglobin: 8.6 g/dL — ABNORMAL LOW (ref 13.0–17.0)
Immature Granulocytes: 1 %
Lymphocytes Relative: 12 %
Lymphs Abs: 1.2 10*3/uL (ref 0.7–4.0)
MCH: 31.2 pg (ref 26.0–34.0)
MCHC: 32.6 g/dL (ref 30.0–36.0)
MCV: 95.7 fL (ref 80.0–100.0)
Monocytes Absolute: 0.8 10*3/uL (ref 0.1–1.0)
Monocytes Relative: 8 %
Neutro Abs: 8.3 10*3/uL — ABNORMAL HIGH (ref 1.7–7.7)
Neutrophils Relative %: 79 %
Platelets: 258 10*3/uL (ref 150–400)
RBC: 2.76 MIL/uL — ABNORMAL LOW (ref 4.22–5.81)
RDW: 14.2 % (ref 11.5–15.5)
WBC: 10.4 10*3/uL (ref 4.0–10.5)
nRBC: 0 % (ref 0.0–0.2)

## 2021-04-07 LAB — ANTI-SMOOTH MUSCLE ANTIBODY, IGG: F-Actin IgG: 15 Units (ref 0–19)

## 2021-04-07 LAB — COMPREHENSIVE METABOLIC PANEL
ALT: 32 U/L (ref 0–44)
AST: 52 U/L — ABNORMAL HIGH (ref 15–41)
Albumin: 2.2 g/dL — ABNORMAL LOW (ref 3.5–5.0)
Alkaline Phosphatase: 76 U/L (ref 38–126)
Anion gap: 6 (ref 5–15)
BUN: 21 mg/dL (ref 8–23)
CO2: 25 mmol/L (ref 22–32)
Calcium: 7.8 mg/dL — ABNORMAL LOW (ref 8.9–10.3)
Chloride: 103 mmol/L (ref 98–111)
Creatinine, Ser: 1.34 mg/dL — ABNORMAL HIGH (ref 0.61–1.24)
GFR, Estimated: 52 mL/min — ABNORMAL LOW (ref >60)
Glucose, Bld: 109 mg/dL — ABNORMAL HIGH (ref 70–99)
Potassium: 3.5 mmol/L (ref 3.5–5.1)
Sodium: 134 mmol/L — ABNORMAL LOW (ref 135–145)
Total Bilirubin: 0.8 mg/dL (ref 0.3–1.2)
Total Protein: 5.6 g/dL — ABNORMAL LOW (ref 6.5–8.1)

## 2021-04-07 LAB — C-REACTIVE PROTEIN: CRP: 12.5 mg/dL — ABNORMAL HIGH (ref <1.0)

## 2021-04-07 LAB — PROTIME-INR
INR: 1.4 — ABNORMAL HIGH (ref 0.8–1.2)
Prothrombin Time: 17.4 seconds — ABNORMAL HIGH (ref 11.4–15.2)

## 2021-04-07 LAB — PHOSPHORUS: Phosphorus: 2 mg/dL — ABNORMAL LOW (ref 2.5–4.6)

## 2021-04-07 LAB — FERRITIN: Ferritin: 581 ng/mL — ABNORMAL HIGH (ref 24–336)

## 2021-04-07 LAB — ALPHA-1-ANTITRYPSIN: A-1 Antitrypsin, Ser: 236 mg/dL — ABNORMAL HIGH (ref 101–187)

## 2021-04-07 LAB — MAGNESIUM: Magnesium: 1.6 mg/dL — ABNORMAL LOW (ref 1.7–2.4)

## 2021-04-07 LAB — MITOCHONDRIAL ANTIBODIES: Mitochondrial M2 Ab, IgG: <20 Units (ref 0.0–20.0)

## 2021-04-07 LAB — D-DIMER, QUANTITATIVE: D-Dimer, Quant: 3.05 ug/mL-FEU — ABNORMAL HIGH (ref 0.00–0.50)

## 2021-04-07 LAB — SEDIMENTATION RATE: Sed Rate: 86 mm/hr — ABNORMAL HIGH (ref 0–16)

## 2021-04-07 LAB — LACTATE DEHYDROGENASE: LDH: 109 U/L (ref 98–192)

## 2021-04-07 SURGERY — ESOPHAGOGASTRODUODENOSCOPY (EGD) WITH PROPOFOL
Anesthesia: Monitor Anesthesia Care

## 2021-04-07 MED ORDER — SODIUM CHLORIDE 0.9 % IV SOLN: INTRAVENOUS | Status: DC

## 2021-04-07 MED ORDER — PROPOFOL 500 MG/50ML IV EMUL
INTRAVENOUS | Status: AC
  Filled 2021-04-07: qty 50

## 2021-04-07 MED ORDER — CARVEDILOL 3.125 MG PO TABS
3.1250 mg | ORAL_TABLET | Freq: Two times a day (BID) | ORAL | Status: DC
  Administered 2021-04-07 – 2021-04-09 (×4): 3.125 mg via ORAL
  Filled 2021-04-07 (×4): qty 1

## 2021-04-07 MED ORDER — PROPOFOL 500 MG/50ML IV EMUL
INTRAVENOUS | Status: DC | PRN
  Administered 2021-04-07: 90 ug/kg/min via INTRAVENOUS

## 2021-04-07 MED ORDER — PROPOFOL 10 MG/ML IV BOLUS
INTRAVENOUS | Status: DC | PRN
  Administered 2021-04-07: 10 mg via INTRAVENOUS

## 2021-04-07 SURGICAL SUPPLY — 15 items

## 2021-04-07 NOTE — Evaluation (Signed)
Occupational Therapy Evaluation Patient Details Name: Gregory [name] MRN: [id number] DOB: 01-06-1933 Today's Date: 04/07/2021   History of Present Illness Pt is an 88 y.o. male admitted 04/04/21 with Dysphagia.  Pt also with recent Covid-19 diagnosis approximately 3 weeks ago (negative testing upon admission).  PMHx A fib, HTN, CAD, Chronic Diastolic CHF, restless leg syndrome (RLS), CVA   Clinical Impression   Patient is an 88 year old male who was admitted for above. Patient was noted to have had a functional decline with family reporting patient used w/c at home more just prior to hospitalization. Currently, patient is min A +2 for transfers, mod A for Lb dressing and noted to have increased spillage from L side of mouth with thin liquid intake.patient would benefit from SNF for further strengthening and caregiver training prior to transitioning home.  Patient would continue to benefit from skilled OT services at this time while admitted and after d/c to address noted deficits in order to improve overall safety and independence in ADLs.       Recommendations for follow up therapy are one component of a multi-disciplinary discharge planning process, led by the attending physician.  Recommendations may be updated based on patient status, additional functional criteria and insurance authorization.   Follow Up Recommendations  Skilled nursing-short term rehab (<3 hours/day)    Assistance Recommended at Discharge Frequent or constant Supervision/Assistance  Patient can return home with the following A lot of help with walking and/or transfers;A lot of help with bathing/dressing/bathroom;Direct supervision/assist for financial management;Assistance with cooking/housework;Direct supervision/assist for medications management;Help with stairs or ramp for entrance    Functional Status Assessment  Patient has had a recent decline in their functional status and demonstrates the ability to make  significant improvements in function in a reasonable and predictable amount of time.  Equipment Recommendations  None recommended by OT    Recommendations for Other Services       Precautions / Restrictions Precautions Precautions: Fall Restrictions Weight Bearing Restrictions: No      Mobility Bed Mobility               General bed mobility comments: patient was EOB at start of session    Transfers                          Balance Overall balance assessment: Needs assistance   Sitting balance-Leahy Scale: Fair     Standing balance support: Bilateral upper extremity supported, Reliant on assistive device for balance Standing balance-Leahy Scale: Poor                             ADL either performed or assessed with clinical judgement   ADL Overall ADL's : Needs assistance/impaired Eating/Feeding: Set up;Sitting Eating/Feeding Details (indicate cue type and reason): with noted dribbling out of L side of mouth. patient took a long time to transition small sip to swallow. nurse made aware of concerns. nurse to consult with MD for SLP eval. SLP was updated as well. Grooming: Wash/dry face;Sitting Grooming Details (indicate cue type and reason): in recliner Upper Body Bathing: Minimal assistance;Sitting   Lower Body Bathing: Moderate assistance;Sit to/from stand;Sitting/lateral leans   Upper Body Dressing : Sitting;Minimal assistance   Lower Body Dressing: Sit to/from stand;Sitting/lateral leans;Moderate assistance Lower Body Dressing Details (indicate cue type and reason): patient was able to doff/don socks with increased time and min A to get sock back  on. Toilet Transfer: +2 for safety/equipment;Minimal assistance Toilet Transfer Details (indicate cue type and reason): patient transitioned stand pivot with RW with min A to weight shift LLE forwards to chair with patient attempting to sit down x2 prior to reaching recliner. patient needed cues  to continue transfer. Toileting- Clothing Manipulation and Hygiene: Moderate assistance;Sit to/from stand;Sitting/lateral lean               Vision Patient Visual Report: No change from baseline Additional Comments: glasses at home for fine reading.     Perception     Praxis      Pertinent Vitals/Pain Pain Assessment Pain Assessment: No/denies pain     Hand Dominance Right   Extremity/Trunk Assessment Upper Extremity Assessment Upper Extremity Assessment: Generalized weakness (ROM WFL. patients BUE able to tolerate minimal pressure at shoulders. noted to have dressing on L forearm)   Lower Extremity Assessment Lower Extremity Assessment: LLE deficits/detail;Generalized weakness LLE Deficits / Details: hx of weakness since CVA per family   Cervical / Trunk Assessment Cervical / Trunk Assessment: Normal   Communication Communication Communication: HOH   Cognition Arousal/Alertness: Awake/alert Behavior During Therapy: WFL for tasks assessed/performed Overall Cognitive Status: Within Functional Limits for tasks assessed                                 General Comments: patient was oriented to self, place and "2033" january, "21st"     General Comments       Exercises     Shoulder Instructions      Home Living Family/patient expects to be discharged to:: Private residence Living Arrangements: Spouse/significant other Available Help at Discharge: Family;Available 24 hours/day Type of Home: House Home Access: Stairs to enter [employer] of Steps: 2   Home Layout: One level               Home Equipment: Cane - single point;Wheelchair - manual          Prior Functioning/Environment Prior Level of Function : Needs assist       Physical Assist : ADLs (physical);Mobility (physical)     Mobility Comments: family reports pt occasionally needing more assist since Covid dx a few weeks ago, was more ambulatory however using  w/c more since Covid ADLs Comments: patient reported rolling around  house with w/c with BUE. patient reported family assisted with all needed tasks.        OT Problem List: Decreased activity tolerance;Impaired balance (sitting and/or standing);Decreased safety awareness;Decreased knowledge of precautions;Decreased knowledge of use of DME or AE;Decreased strength      OT Treatment/Interventions: Self-care/ADL training;Neuromuscular education;Energy conservation;DME and/or AE instruction;Therapeutic activities;Balance training;Patient/family education    OT Goals(Current goals can be found in the care plan section) Acute Rehab OT Goals Patient Stated Goal: to go back home OT Goal Formulation: With patient Time For Goal Achievement: 04/21/21 Potential to Achieve Goals: Good  OT Frequency: Min 2X/week    Co-evaluation              AM-PAC OT "6 Clicks" Daily Activity     Outcome Measure Help from another person eating meals?: A Little Help from another person taking care of personal grooming?: A Little Help from another person toileting, which includes using toliet, bedpan, or urinal?: A Lot Help from another person bathing (including washing, rinsing, drying)?: A Lot Help from another person to put on and taking off regular upper body clothing?: A  Lot Help from another person to put on and taking off regular lower body clothing?: A Lot 6 Click Score: 14   End of Session Equipment Utilized During Treatment: Gait belt;Rolling walker (2 wheels) Nurse Communication: Mobility status;Other (comment) (swallow concerns)  Activity Tolerance: Patient tolerated treatment well Patient left: in chair;with call bell/phone within reach;with chair alarm set  OT Visit Diagnosis: Unsteadiness on feet (R26.81);Muscle weakness (generalized) (M62.81)                Time: 6184-8592 OT Time Calculation (min): 27 min Charges:  OT General Charges $OT Visit: 1 Visit OT Evaluation $OT Eval  Moderate Complexity: 1 Mod  Jackelyn Poling OTR/L, MS Acute Rehabilitation Department Office# [phone] Pager# [phone]   Marcellina Millin 04/07/2021, 3:33 PM

## 2021-04-07 NOTE — Anesthesia Preprocedure Evaluation (Addendum)
Anesthesia Evaluation  Patient identified by MRN, date of birth, ID band Patient awake    Reviewed: Allergy & Precautions, NPO status , Patient's Chart, lab work & pertinent test results, reviewed documented beta blocker date and time   History of Anesthesia Complications Negative for: history of anesthetic complications  Airway Mallampati: I  TM Distance: >3 FB Neck ROM: Full    Dental  (+) Edentulous Upper, Edentulous Lower, Dental Advisory Given   Pulmonary neg pulmonary ROS, former smoker,    Pulmonary exam normal        Cardiovascular hypertension, Pt. on home beta blockers and Pt. on medications + CAD, + CABG and +CHF  Normal cardiovascular exam+ dysrhythmias   Study Conclusions   - Left ventricle: The cavity size was normal. Wall thickness was  increased in a pattern of mild LVH. Basal to mid anterolateral  and inferolateral hypokinesis. Systolic function was mildly to  moderately reduced. The estimated ejection fraction was in the  range of 40% to 45%. The study was not technically sufficient to  allow evaluation of LV diastolic dysfunction due to atrial  fibrillation.  - Aortic valve: Trileaflet; moderately calcified leaflets. There  was no stenosis. There was mild regurgitation.  - Mitral valve: There was mild regurgitation.  - Left atrium: The atrium was severely dilated.  - Right ventricle: The cavity size was mildly to moderately  dilated. Systolic function was mildly to moderately reduced.  - Right atrium: The atrium was moderately dilated.  - Tricuspid valve: Peak RV-RA gradient (S): 32 mm Hg.  - Pulmonary arteries: PA peak pressure: 35 mm Hg (S).  - Inferior vena cava: The vessel was normal in size. The  respirophasic diameter changes were in the normal range (>= 50%),  consistent with normal central venous pressure.    Neuro/Psych negative neurological ROS     GI/Hepatic Neg liver  ROS, GERD  Medicated,  Endo/Other  Hypothyroidism   Renal/GU Renal InsufficiencyRenal disease     Musculoskeletal negative musculoskeletal ROS (+)   Abdominal   Peds  Hematology  (+) anemia ,   Anesthesia Other Findings   Reproductive/Obstetrics                           Anesthesia Physical Anesthesia Plan  ASA: 3  Anesthesia Plan: MAC   Post-op Pain Management:    Induction:   PONV Risk Score and Plan: 1 and Ondansetron and Propofol infusion  Airway Management Planned: Simple Face Mask and Natural Airway  Additional Equipment:   Intra-op Plan:   Post-operative Plan:   Informed Consent: I have reviewed the patients History and Physical, chart, labs and discussed the procedure including the risks, benefits and alternatives for the proposed anesthesia with the patient or authorized representative who has indicated his/her understanding and acceptance.     Dental advisory given  Plan Discussed with: Anesthesiologist and CRNA  Anesthesia Plan Comments:        Anesthesia Quick Evaluation

## 2021-04-07 NOTE — Op Note (Addendum)
[facility] Patient Name: Gregory [name] Procedure Date: 04/07/2021 MRN: [id number] Attending MD: Milus Banister , MD Date of Birth: [birth year] CSN: [id number] Age: 88 Admit Type: Inpatient Procedure:                Upper GI endoscopy Indications:              Dysphagia, weight loss. EGD 2020 Dr. Carlean Purl                            dilated two benign appearing distal esophageal                            strictures with good relief of his dysphagia for a                            long time Providers:                Milus Banister, MD, Dulcy Fanny, Tyna Jaksch Technician Referring MD:              Medicines:                Monitored Anesthesia Care Complications:            No immediate complications. Estimated blood loss:                            None. Estimated Blood Loss:     Estimated blood loss: none. Procedure:                Pre-Anesthesia Assessment:                           - Prior to the procedure, a History and Physical                            was performed, and patient medications and                            allergies were reviewed. The patient's tolerance of                            previous anesthesia was also reviewed. The risks                            and benefits of the procedure and the sedation                            options and risks were discussed with the patient.                            All questions were answered, and informed consent                            was obtained. Prior  Anticoagulants: The patient has                            taken Eliquis (apixaban), last dose was 4 days                            prior to procedure. ASA Grade Assessment: IV - A                            patient with severe systemic disease that is a                            constant threat to life. After reviewing the risks                            and benefits, the patient was deemed in                             satisfactory condition to undergo the procedure.                           After obtaining informed consent, the endoscope was                            passed under direct vision. Throughout the                            procedure, the patient's blood pressure, pulse, and                            oxygen saturations were monitored continuously. The                            GIF-H190 ([id number]) Olympus endoscope was introduced                            through the mouth, and advanced to the second part                            of duodenum. The upper GI endoscopy was                            accomplished without difficulty. The patient                            tolerated the procedure well. Scope In: Scope Out: Findings:      One benign-appearing, 'double ring' GE junction stenosis was found at       and just above the gastroesophageal junction (see images). A TTS dilator       was passed through the scope. Dilation with an 18-19-20 mm balloon       dilator was performed to 18 mm with typical resultant minor mucosal       disruption and self limited bleeding.      The  exam was otherwise without abnormality. Impression:               - One benign-appearing, 'double ring' GE junction                            stenosis was found at and just above the                            gastroesophageal junction; dilated today to 88mm                           - The examination was otherwise normal. Moderate Sedation:      Not Applicable - Patient had care per Anesthesia. Recommendation:           - Full liquids for now, advance as tolerated.                           - Can resume his eliquis tomorrow and discharge if                            his swallowing is improving. Procedure Code(s):        --- Professional ---                           [id number], Esophagogastroduodenoscopy, flexible,                            transoral; with transendoscopic balloon dilation of                             esophagus (less than 30 mm diameter) Diagnosis Code(s):        --- Professional ---                           K22.2, Esophageal obstruction                           R13.10, Dysphagia, unspecified CPT copyright 2019 [employer]. All rights reserved. The codes documented in this report are preliminary and upon coder review may  be revised to meet current compliance requirements. Milus Banister, MD 04/07/2021 9:59:41 AM This report has been signed electronically. Number of Addenda: 0

## 2021-04-07 NOTE — Progress Notes (Signed)
Patient back to room from Endo. VS stable and patient free from pain.  

## 2021-04-07 NOTE — Progress Notes (Signed)
PT Cancellation Note  Patient Details Name: Gregory [name] MRN: [id number] DOB: Jan 08, 1933   Cancelled Treatment:    Reason Eval/Treat Not Completed: Patient at procedure or test/unavailable   Kati L Payson 04/07/2021, 9:40 AM Arlyce Dice, DPT Acute Rehabilitation Services Pager: [phone] Office: [phone]

## 2021-04-07 NOTE — Transfer of Care (Signed)
Immediate Anesthesia Transfer of Care Note  Patient: Gregory [name]  Procedure(s) Performed: ESOPHAGOGASTRODUODENOSCOPY (EGD) WITH PROPOFOL Balloon dilation wire-guided  Patient Location: PACU  Anesthesia Type:MAC  Level of Consciousness: drowsy  Airway & Oxygen Therapy: Patient Spontanous Breathing and Patient connected to face mask oxygen  Post-op Assessment: Report given to RN, Post -op Vital signs reviewed and stable and Patient moving all extremities X 4  Post vital signs: Reviewed and stable  Last Vitals:  Vitals Value Taken Time  BP 101/39   Temp    Pulse 93   Resp 26   SpO2 96     Last Pain:  Vitals:   04/07/21 0901  TempSrc: Temporal  PainSc: 0-No pain         Complications: No notable events documented.

## 2021-04-07 NOTE — Anesthesia Postprocedure Evaluation (Signed)
Anesthesia Post Note  Patient: AL BRACEWELL  Procedure(s) Performed: ESOPHAGOGASTRODUODENOSCOPY (EGD) WITH PROPOFOL Balloon dilation wire-guided     Patient location during evaluation: Endoscopy Anesthesia Type: MAC Level of consciousness: awake and alert Pain management: pain level controlled Vital Signs Assessment: post-procedure vital signs reviewed and stable Respiratory status: spontaneous breathing, nonlabored ventilation, respiratory function stable and patient connected to nasal cannula oxygen Cardiovascular status: blood pressure returned to baseline and stable Postop Assessment: no apparent nausea or vomiting Anesthetic complications: no   No notable events documented.  Last Vitals:  Vitals:   04/07/21 1000 04/07/21 1010  BP: (!) 107/41 (!) 134/49  Pulse: 92 81  Resp: (!) 33 (!) 22  Temp: 36.6 C   SpO2: 93% 93%    Last Pain:  Vitals:   04/07/21 1010  TempSrc:   PainSc: 0-No pain                 Martine Trageser DANIEL

## 2021-04-07 NOTE — Evaluation (Signed)
Physical Therapy Evaluation Patient Details Name: BREANNA MCDANIEL MRN: [id number] DOB: 02-26-1933 Today's Date: 04/07/2021  History of Present Illness  Pt is an 88 y.o. male admitted 04/04/21 with Dysphagia.  Pt also with recent Covid-19 diagnosis approximately 3 weeks ago (negative testing upon admission).  PMHx A fib, HTN, CAD, Chronic Diastolic CHF, restless leg syndrome (RLS), CVA  Clinical Impression  Pt admitted with above diagnosis. Pt currently with functional limitations due to the deficits listed below (see PT Problem List). Pt will benefit from skilled PT to increase their independence and safety with mobility to allow discharge to the venue listed below.  Pt reports feeling very weak and fatigued.  Daughter present and reports pt has been using w/c more since having Covid and occasionally requiring physical assist.  Daughter uncertain at time of evaluation if family could continue to assist pt at home (and plans to check with family) so will recommend SNF at this time if family is indeed not able to assist.      Recommendations for follow up therapy are one component of a multi-disciplinary discharge planning process, led by the attending physician.  Recommendations may be updated based on patient status, additional functional criteria and insurance authorization.  Follow Up Recommendations Skilled nursing-short term rehab (<3 hours/day)    Assistance Recommended at Discharge Frequent or constant Supervision/Assistance  Patient can return home with the following  A little help with walking and/or transfers;A little help with bathing/dressing/bathroom;Help with stairs or ramp for entrance    Equipment Recommendations Rolling walker (2 wheels)  Recommendations for Other Services       Functional Status Assessment Patient has had a recent decline in their functional status and demonstrates the ability to make significant improvements in function in a reasonable and predictable amount  of time.     Precautions / Restrictions Precautions Precautions: Fall      Mobility  Bed Mobility Overal bed mobility: Needs Assistance Bed Mobility: Rolling, Sidelying to Sit Rolling: Min guard Sidelying to sit: Min assist       General bed mobility comments: cues for self assist, assist required for trunk upright    Transfers Overall transfer level: Needs assistance Equipment used: Rolling walker (2 wheels) Transfers: Sit to/from Stand, Bed to chair/wheelchair/BSC Sit to Stand: +2 safety/equipment, Min assist, Mod assist   Step pivot transfers: Min assist, +2 safety/equipment, Mod assist       General transfer comment: light assist to rise and steady, difficulty taking steps over to recliner, assist to control descent as pt fatigued    Ambulation/Gait                  Stairs            Wheelchair Mobility    Modified Rankin (Stroke Patients Only)       Balance Overall balance assessment: Needs assistance         Standing balance support: Bilateral upper extremity supported, Reliant on assistive device for balance Standing balance-Leahy Scale: Poor                               Pertinent Vitals/Pain Pain Assessment Pain Assessment: No/denies pain    Home Living Family/patient expects to be discharged to:: Private residence Living Arrangements: Spouse/significant other Available Help at Discharge: Family;Available 24 hours/day Type of Home: House Home Access: Stairs to enter   [employer] of Steps: 2   Home Layout: One level  Home Equipment: Kasandra Knudsen - single point;Wheelchair - manual      Prior Function Prior Level of Function : Needs assist       Physical Assist : ADLs (physical);Mobility (physical)     Mobility Comments: family reports pt occasionally needing more assist since Covid dx a few weeks ago, was more ambulatory however using w/c more since Covid ADLs Comments: patient reported rolling around   house with w/c with BUE. patient reported family assisted with all needed tasks.     Hand Dominance   Dominant Hand: Right    Extremity/Trunk Assessment   Upper Extremity Assessment Upper Extremity Assessment: Generalized weakness (ROM WFL. patients BUE able to tolerate minimal pressure at shoulders. noted to have dressing on L forearm)    Lower Extremity Assessment Lower Extremity Assessment: LLE deficits/detail;Generalized weakness LLE Deficits / Details: hx of weakness since CVA per family    Cervical / Trunk Assessment Cervical / Trunk Assessment: Normal  Communication   Communication: [facility]  Cognition                                                General Comments      Exercises     Assessment/Plan    PT Assessment Patient needs continued PT services  PT Problem List Decreased mobility;Decreased balance;Decreased strength;Decreased activity tolerance;Decreased knowledge of use of DME       PT Treatment Interventions DME instruction;Gait training;Balance training;Therapeutic exercise;Functional mobility training;Therapeutic activities;Patient/family education    PT Goals (Current goals can be found in the Care Plan section)  Acute Rehab PT Goals PT Goal Formulation: With patient/family Time For Goal Achievement: 04/21/21 Potential to Achieve Goals: Good    Frequency Min 2X/week     Co-evaluation               AM-PAC PT "6 Clicks" Mobility  Outcome Measure Help needed turning from your back to your side while in a flat bed without using bedrails?: A Little Help needed moving from lying on your back to sitting on the side of a flat bed without using bedrails?: A Little Help needed moving to and from a bed to a chair (including a wheelchair)?: A Little Help needed standing up from a chair using your arms (e.g., wheelchair or bedside chair)?: A Little Help needed to walk in hospital room?: A Lot Help needed climbing 3-5 steps with a  railing? : A Lot 6 Click Score: 16    End of Session Equipment Utilized During Treatment: Gait belt Activity Tolerance: Patient tolerated treatment well Patient left: in chair;with call bell/phone within reach (with OT)   PT Visit Diagnosis: Difficulty in walking, not elsewhere classified (R26.2);Muscle weakness (generalized) (M62.81)    Time: 8299-3716 PT Time Calculation (min) (ACUTE ONLY): 14 min   Jannette Spanner PT, DPT Acute Rehabilitation Services Pager: [phone] Office: [place] 04/07/2021, 3:16 PM

## 2021-04-07 NOTE — Interval H&P Note (Signed)
History and Physical Interval Note:  04/07/2021 9:27 AM  Gregory [name]  has presented today for surgery, with the diagnosis of Dysphagia.  The various methods of treatment have been discussed with the patient and family. After consideration of risks, benefits and other options for treatment, the patient has consented to  Procedure(s): ESOPHAGOGASTRODUODENOSCOPY (EGD) WITH PROPOFOL (N/A) as a surgical intervention.  The patient's history has been reviewed, patient examined, no change in status, stable for surgery.  I have reviewed the patient's chart and labs.  Questions were answered to the patient's satisfaction.     Milus Banister

## 2021-04-07 NOTE — Anesthesia Procedure Notes (Signed)
Procedure Name: MAC Date/Time: 04/07/2021 9:37 AM Performed by: Niel Hummer, CRNA Pre-anesthesia Checklist: Patient identified, Emergency Drugs available, Suction available and Patient being monitored Oxygen Delivery Method: Simple face mask

## 2021-04-07 NOTE — Progress Notes (Signed)
PROGRESS NOTE    Gregory [name]  IRW:[id number] DOB: 07-30-1932 DOA: 04/04/2021 PCP: Darreld Mclean, MD     Brief Narrative:   88 y.o. WM PMHx A fib, HTN, CAD, Chronic Diastolic CHF, restless leg syndrome (RLS),   Presenting with dysphagia. He has had swallowing issue for several months. At first it was just with solids; but now it has progressed to liquids as well. He is still able to take his pills. Understandably during this time, he has progressively felt weaker and has had increased difficulty with mobility. He was seen in GI clinic this morning and it was recommended that he come to the ED for evaluation.    ED Course: [facility] was consulted. He was given fluids. TRH was called for admission.    Subjective: 1/23 patient still sleepy but arousable post EGD.     Assessment & Plan: Covid vaccination;   Principal Problem:   Dysphagia Active Problems:   Chronic a-fib (HCC)   Chronic diastolic CHF (congestive heart failure) (HCC)   Essential hypertension   HLD (hyperlipidemia)   Elevated liver enzymes   Acute renal failure superimposed on stage 3a chronic kidney disease (HCC)   Anemia, unspecified   Hypothyroidism   Restless leg syndrome   Failure to thrive in adult   Other cirrhosis of liver ([place])    Dysphagia -Per Dr. Carol Ada, GI,EGD will be performed tomorrow, however, it is not clear if he will be able to undergo a dilation with the new findings of cirrhosis.  If he has esophageal varices, dilation will not be performed. - Acute hepatitis panel negative, alpha-1-antitrypsin, iron panel, ferritin, ANA, ASMA, and AMA pending.   -1/22 per GI Anticipate EGD tomorrow at 8 AM with Dr. Tarri Glenn -1/23 s/p EGD with dilation see below  Liver cirrhosis - See US abdomen below - Per patient does not drink use drugs   Chronic diastolic CHF -Strict in and out +353.6 - Daily weight Filed Weights   04/04/21 1000  Weight: 79 kg     A. fib - On Eliquis (hold) for  surgery -Heparin per pharmacy until 1/24 per GI recommendation -1/23 back in A. fib but rate controlled - 1/23 restart Coreg 3.125 mg BID  Essential HTN - See A. Fib -1/22 BP somewhat soft. - 1/22 albumin 50 g x 1 - Normal saline 62ml/hr; continue until patient more awake  CAD -See CHF  HLD - Hold statin secondary to elevated LFT  Elevated LFT - Acute hepatitis panel negative - US abdomen RUQ pending - Hold statin  Acute on CKD stage IIIa (baseline Cr1.3-1.5) -Most likely secondary to dehydration Lab Results  Component Value Date   CREATININE 1.34 (H) 04/07/2021   CREATININE 1.77 (H) 04/06/2021   CREATININE 1.99 (H) 04/05/2021   CREATININE 2.52 (H) 04/04/2021   CREATININE 1.56 (H) 03/25/2021  - Will hydrate prior to surgery: Normal saline 104ml/hr -Baseline   Unspecified anemia - Anemia panel pending - Occult blood pending - DC Eliquis: DC heparin 2400 in preparation for his surgery - Transfuse for hemoglobin <7         GERD    Hypothyroidism - Synthroid 25 mcg daily -TSH pending   Restless leg syndrome -Requip 0.25 mg BID  COVID Positive December 22 -Per [facility] protocol was not tested upon admission, will obtain COVID test per protocol  COVID-19 Labs  Recent Labs    04/05/21 2041 04/06/21 0536 04/07/21 0518  DDIMER 3.90* 2.92* 3.05*  FERRITIN 846* 881*  876* 581*  LDH 152 132 109  CRP 16.9* 15.6* 12.5*     Lab Results  Component Value Date   SARSCOV2NAA NEGATIVE 04/05/2021   SARSCOV2NAA POSITIVE (A) 03/25/2021   SARSCOV2NAA POSITIVE (A) 03/14/2021   SARSCOV2NAA RESULT: NEGATIVE 02/26/2020      Failure to thrive - Generalized weakness - Poor p.o. intake secondary to dysphagia  Hypomagnesmia - Magnesium goal> 2 - Magnesium IV 2g  Goals of care - 1/23 PT/OT consult:'s since COVID has been extremely weak/FTT evaluate for CIR vs SNF       DVT prophylaxis: Heparin drip Code Status:  Family Communication: 1/23 daughter at bedside  for discussion of plan of care all questions answered Status is: Inpatient    Dispo: The patient is from:               Anticipated d/c is to:               Anticipated d/c date is:               Patient currently       Consultants:  Dr. Carol Ada, GI    Procedures/Significant Events:  1/21 US abdomen complete: -Morphologic features of the liver suggestive of cirrhosis. -Gallbladder wall thickening. No gallstones, pericholecystic fluid or sonographic Murphy's sign noted. 1/23 EGD -One benign-appearing, 'double ring' GE junction stenosis was found at and just above the gastroesophageal junction (see images).  -A TTS dilator was passed through the scope. Dilation with an 18-19-20 mm balloon dilator was performed to 18 mm with typical resultant minor mucosal disruption and self limited bleedin    I have personally reviewed and interpreted all radiology studies and my findings are as above.  VENTILATOR SETTINGS:    Cultures 1/20 Acute Hepatitis Panel negative   Antimicrobials:    Devices    LINES / TUBES:      Continuous Infusions:  sodium chloride 75 mL/hr at 04/07/21 1113     Objective: Vitals:   04/07/21 0901 04/07/21 1000 04/07/21 1010 04/07/21 1038  BP: (!) 135/43 (!) 107/41 (!) 134/49 (!) 144/60  Pulse: 73 92 81 74  Resp: (!) 22 (!) 33 (!) 22 20  Temp: 99 F (37.2 C) 97.8 F (36.6 C)  98.3 F (36.8 C)  TempSrc: Temporal   Oral  SpO2: 93% 93% 93% 93%  Weight:      Height:        Intake/Output Summary (Last 24 hours) at 04/07/2021 1332 Last data filed at 04/07/2021 7353 Gross per 24 hour  Intake 1981.95 ml  Output 1100 ml  Net 881.95 ml    Filed Weights   04/04/21 1000  Weight: 79 kg    Examination:  General: A/O x4 No acute respiratory distress, cachectic Eyes: negative scleral hemorrhage, negative anisocoria, negative icterus ENT: Negative Runny nose, negative gingival bleeding, Neck:  Negative scars, masses, torticollis,  lymphadenopathy, JVD Lungs: decreased breath sounds bilaterally without wheezes or crackles Cardiovascular: Regular rate and rhythm without murmur gallop or rub normal S1 and S2 Abdomen: negative abdominal pain, nondistended, positive soft, bowel sounds, no rebound, no ascites, no appreciable mass Extremities: No significant cyanosis, clubbing, or edema bilateral lower extremities Skin: Negative rashes, lesions, ulcers Psychiatric:  Negative depression, negative anxiety, negative fatigue, negative mania  Central nervous system:  Cranial nerves II through XII intact, tongue/uvula midline, all extremities muscle strength 5/5, sensation intact throughout, negative dysarthria, negative expressive aphasia, negative receptive aphasia.  .     Data Reviewed: Care  during the described time interval was provided by me .  I have reviewed this patient's available data, including medical history, events of note, physical examination, and all test results as part of my evaluation.  CBC: Recent Labs  Lab 04/04/21 1026 04/05/21 0614 04/06/21 0536 04/07/21 0518  WBC 9.1 8.2 9.5 10.4  NEUTROABS 6.9 6.2 7.4 8.3*  HGB 10.5* 8.9* 9.7* 8.6*  HCT 32.7* 28.5* 30.5* 26.4*  MCV 96.7 96.6 97.1 95.7  PLT 275 280 294 026    Basic Metabolic Panel: Recent Labs  Lab 04/04/21 1026 04/05/21 0614 04/06/21 0536 04/07/21 0518  NA 133* 134* 134* 134*  K 4.2 4.1 3.9 3.5  CL 96* 102 102 103  CO2 28 23 25 25   GLUCOSE 108* 57* 92 109*  BUN 55* 40* 32* 21  CREATININE 2.52* 1.99* 1.77* 1.34*  CALCIUM 8.1* 7.4* 7.8* 7.8*  MG  --   --  1.6* 1.6*  PHOS  --   --  3.1 2.0*    GFR: Estimated Creatinine Clearance: 39.1 mL/min (A) (by C-G formula based on SCr of 1.34 mg/dL (H)). Liver Function Tests: Recent Labs  Lab 04/04/21 1026 04/05/21 0614 04/06/21 0536 04/07/21 0518  AST 178* 113* 82* 52*  ALT 79* 52* 45* 32  ALKPHOS 115 90 90 76  BILITOT 0.5 0.7 0.7 0.8  PROT 7.2 5.4* 5.8* 5.6*  ALBUMIN 1.9* 1.5*  1.6* 2.2*    No results for input(s): LIPASE, AMYLASE in the last 168 hours. No results for input(s): AMMONIA in the last 168 hours. Coagulation Profile: Recent Labs  Lab 04/06/21 0536 04/07/21 0518  INR 1.3* 1.4*    Cardiac Enzymes: No results for input(s): CKTOTAL, CKMB, CKMBINDEX, TROPONINI in the last 168 hours. BNP (last 3 results) No results for input(s): PROBNP in the last 8760 hours. HbA1C: No results for input(s): HGBA1C in the last 72 hours. CBG: No results for input(s): GLUCAP in the last 168 hours. Lipid Profile: No results for input(s): CHOL, HDL, LDLCALC, TRIG, CHOLHDL, LDLDIRECT in the last 72 hours. Thyroid Function Tests: No results for input(s): TSH, T4TOTAL, FREET4, T3FREE, THYROIDAB in the last 72 hours. Anemia Panel: Recent Labs    04/06/21 0536 04/07/21 0518  FERRITIN 881*   876* 581*  TIBC 133*  --   IRON 55  --     Sepsis Labs: No results for input(s): PROCALCITON, LATICACIDVEN in the last 168 hours.  Recent Results (from the past 240 hour(s))  Resp Panel by RT-PCR (Flu A&B, Covid) Nasopharyngeal Swab     Status: None   Collection Time: 04/05/21 10:02 PM   Specimen: Nasopharyngeal Swab; Nasopharyngeal(NP) swabs in vial transport medium  Result Value Ref Range Status   SARS Coronavirus 2 by RT PCR NEGATIVE NEGATIVE Final    Comment: (NOTE) SARS-CoV-2 target nucleic acids are NOT DETECTED.  The SARS-CoV-2 RNA is generally detectable in upper respiratory specimens during the acute phase of infection. The lowest concentration of SARS-CoV-2 viral copies this assay can detect is 138 copies/mL. A negative result does not preclude SARS-Cov-2 infection and should not be used as the sole basis for treatment or other patient management decisions. A negative result may occur with  improper specimen collection/handling, submission of specimen other than nasopharyngeal swab, presence of viral mutation(s) within the areas targeted by this assay, and  inadequate number of viral copies(<138 copies/mL). A negative result must be combined with clinical observations, patient history, and epidemiological information. The expected result is Negative.  Fact Sheet for  Patients:  EntrepreneurPulse.com.au  Fact Sheet for Healthcare Providers:  IncredibleEmployment.be  This test is no t yet approved or cleared by the [place] FDA and  has been authorized for detection and/or diagnosis of SARS-CoV-2 by FDA under an Emergency Use Authorization (EUA). This EUA will remain  in effect (meaning this test can be used) for the duration of the COVID-19 declaration under Section 564(b)(1) of the Act, 21 U.S.C.section 360bbb-3(b)(1), unless the authorization is terminated  or revoked sooner.       Influenza A by PCR NEGATIVE NEGATIVE Final   Influenza B by PCR NEGATIVE NEGATIVE Final    Comment: (NOTE) The Xpert Xpress SARS-CoV-2/FLU/RSV plus assay is intended as an aid in the diagnosis of influenza from Nasopharyngeal swab specimens and should not be used as a sole basis for treatment. Nasal washings and aspirates are unacceptable for Xpert Xpress SARS-CoV-2/FLU/RSV testing.  Fact Sheet for Patients: EntrepreneurPulse.com.au  Fact Sheet for Healthcare Providers: IncredibleEmployment.be  This test is not yet approved or cleared by the [place] FDA and has been authorized for detection and/or diagnosis of SARS-CoV-2 by FDA under an Emergency Use Authorization (EUA). This EUA will remain in effect (meaning this test can be used) for the duration of the COVID-19 declaration under Section 564(b)(1) of the Act, 21 U.S.C. section 360bbb-3(b)(1), unless the authorization is terminated or revoked.  Performed at [facility], [place] [address], [place], [place] [address]           Radiology Studies: No results found.      Scheduled Meds:   levothyroxine  25 mcg Oral Q0600   melatonin  3 mg Oral QHS   pantoprazole (PROTONIX) IV  40 mg Intravenous Q12H   rOPINIRole  0.25 mg Oral BID   Continuous Infusions:  sodium chloride 75 mL/hr at 04/07/21 1113     LOS: 2 days    Time spent:40 min    Lizet Kelso, Geraldo Docker, MD [facility]   If 7PM-7AM, please contact night-coverage 04/07/2021, 1:32 PM

## 2021-04-08 ENCOUNTER — Encounter (HOSPITAL_COMMUNITY): Payer: Self-pay | Admitting: Gastroenterology

## 2021-04-08 ENCOUNTER — Inpatient Hospital Stay (HOSPITAL_COMMUNITY): Payer: Medicare Other

## 2021-04-08 LAB — LACTATE DEHYDROGENASE: LDH: 168 U/L (ref 98–192)

## 2021-04-08 LAB — CBC WITH DIFFERENTIAL/PLATELET
Abs Immature Granulocytes: 0.31 10*3/uL — ABNORMAL HIGH (ref 0.00–0.07)
Basophils Absolute: 0 10*3/uL (ref 0.0–0.1)
Basophils Relative: 0 %
Eosinophils Absolute: 0 10*3/uL (ref 0.0–0.5)
Eosinophils Relative: 0 %
HCT: 28.2 % — ABNORMAL LOW (ref 39.0–52.0)
Hemoglobin: 9.2 g/dL — ABNORMAL LOW (ref 13.0–17.0)
Immature Granulocytes: 1 %
Lymphocytes Relative: 6 %
Lymphs Abs: 1.4 10*3/uL (ref 0.7–4.0)
MCH: 30.9 pg (ref 26.0–34.0)
MCHC: 32.6 g/dL (ref 30.0–36.0)
MCV: 94.6 fL (ref 80.0–100.0)
Monocytes Absolute: 1.2 10*3/uL — ABNORMAL HIGH (ref 0.1–1.0)
Monocytes Relative: 6 %
Neutro Abs: 18.9 10*3/uL — ABNORMAL HIGH (ref 1.7–7.7)
Neutrophils Relative %: 87 %
Platelets: 258 10*3/uL (ref 150–400)
RBC: 2.98 MIL/uL — ABNORMAL LOW (ref 4.22–5.81)
RDW: 14.1 % (ref 11.5–15.5)
WBC: 21.9 10*3/uL — ABNORMAL HIGH (ref 4.0–10.5)
nRBC: 0 % (ref 0.0–0.2)

## 2021-04-08 LAB — COMPREHENSIVE METABOLIC PANEL
ALT: 27 U/L (ref 0–44)
AST: 41 U/L (ref 15–41)
Albumin: 2 g/dL — ABNORMAL LOW (ref 3.5–5.0)
Alkaline Phosphatase: 78 U/L (ref 38–126)
Anion gap: 8 (ref 5–15)
BUN: 17 mg/dL (ref 8–23)
CO2: 22 mmol/L (ref 22–32)
Calcium: 7.7 mg/dL — ABNORMAL LOW (ref 8.9–10.3)
Chloride: 104 mmol/L (ref 98–111)
Creatinine, Ser: 1.31 mg/dL — ABNORMAL HIGH (ref 0.61–1.24)
GFR, Estimated: 53 mL/min — ABNORMAL LOW (ref >60)
Glucose, Bld: 110 mg/dL — ABNORMAL HIGH (ref 70–99)
Potassium: 3.3 mmol/L — ABNORMAL LOW (ref 3.5–5.1)
Sodium: 134 mmol/L — ABNORMAL LOW (ref 135–145)
Total Bilirubin: 0.9 mg/dL (ref 0.3–1.2)
Total Protein: 5.8 g/dL — ABNORMAL LOW (ref 6.5–8.1)

## 2021-04-08 LAB — MAGNESIUM: Magnesium: 1.5 mg/dL — ABNORMAL LOW (ref 1.7–2.4)

## 2021-04-08 LAB — D-DIMER, QUANTITATIVE: D-Dimer, Quant: 5.67 ug/mL-FEU — ABNORMAL HIGH (ref 0.00–0.50)

## 2021-04-08 LAB — CBC
HCT: 28.8 % — ABNORMAL LOW (ref 39.0–52.0)
Hemoglobin: 9.2 g/dL — ABNORMAL LOW (ref 13.0–17.0)
MCH: 31 pg (ref 26.0–34.0)
MCHC: 31.9 g/dL (ref 30.0–36.0)
MCV: 97 fL (ref 80.0–100.0)
Platelets: 227 10*3/uL (ref 150–400)
RBC: 2.97 MIL/uL — ABNORMAL LOW (ref 4.22–5.81)
RDW: 14.3 % (ref 11.5–15.5)
WBC: 21 10*3/uL — ABNORMAL HIGH (ref 4.0–10.5)
nRBC: 0 % (ref 0.0–0.2)

## 2021-04-08 LAB — PHOSPHORUS: Phosphorus: 2 mg/dL — ABNORMAL LOW (ref 2.5–4.6)

## 2021-04-08 LAB — TSH: TSH: 4.498 u[IU]/mL (ref 0.350–4.500)

## 2021-04-08 LAB — SEDIMENTATION RATE: Sed Rate: 92 mm/hr — ABNORMAL HIGH (ref 0–16)

## 2021-04-08 LAB — C-REACTIVE PROTEIN: CRP: 17.9 mg/dL — ABNORMAL HIGH (ref <1.0)

## 2021-04-08 LAB — PROTIME-INR
INR: 1.6 — ABNORMAL HIGH (ref 0.8–1.2)
Prothrombin Time: 18.9 seconds — ABNORMAL HIGH (ref 11.4–15.2)

## 2021-04-08 LAB — FERRITIN: Ferritin: 646 ng/mL — ABNORMAL HIGH (ref 24–336)

## 2021-04-08 MED ORDER — BOOST / RESOURCE BREEZE PO LIQD CUSTOM
1.0000 | Freq: Three times a day (TID) | ORAL | Status: DC
  Administered 2021-04-08 – 2021-04-09 (×3): 1 via ORAL

## 2021-04-08 MED ORDER — MAGNESIUM SULFATE 2 GM/50ML IV SOLN
2.0000 g | Freq: Once | INTRAVENOUS | Status: AC
  Administered 2021-04-08: 12:00:00 2 g via INTRAVENOUS
  Filled 2021-04-08: qty 50

## 2021-04-08 MED ORDER — ROSUVASTATIN CALCIUM 20 MG PO TABS
40.0000 mg | ORAL_TABLET | Freq: Every day | ORAL | Status: DC
  Administered 2021-04-08 – 2021-04-09 (×2): 40 mg via ORAL
  Filled 2021-04-08 (×2): qty 2

## 2021-04-08 MED ORDER — PANTOPRAZOLE SODIUM 40 MG PO TBEC
40.0000 mg | DELAYED_RELEASE_TABLET | Freq: Two times a day (BID) | ORAL | Status: DC
  Administered 2021-04-08 – 2021-04-09 (×2): 40 mg via ORAL
  Filled 2021-04-08: qty 1

## 2021-04-08 MED ORDER — POTASSIUM CHLORIDE CRYS ER 20 MEQ PO TBCR
30.0000 meq | EXTENDED_RELEASE_TABLET | Freq: Once | ORAL | Status: AC
  Administered 2021-04-08: 12:00:00 30 meq via ORAL
  Filled 2021-04-08: qty 1

## 2021-04-08 MED ORDER — APIXABAN 2.5 MG PO TABS
2.5000 mg | ORAL_TABLET | Freq: Two times a day (BID) | ORAL | Status: DC
  Administered 2021-04-08 – 2021-04-09 (×3): 2.5 mg via ORAL
  Filled 2021-04-08 (×3): qty 1

## 2021-04-08 NOTE — Progress Notes (Signed)
Clinical swallow evaluation completed - Pt does present with facial nerve deficit likely from prior MBS - decreased labial closure.  Otherwise no focal CN deficits apparent.  Observed pt consuming 3 ounces water, Sprite, graham crackers and applesauce. No indication of dysphagia or aspiration even with 3 ounce [place] water screen.  Pt has dentures - and reports he wears them at night.  Advised he clean dentures and his gums/tongue nightly to decrease bacterial load.  As RN reported pt with coughing with water yesterday- suspect acute transient deficits due to impact of procedure.  Pt does endorse occasional coughing with water - since COVID.  He has not had pneumonias.  Advised pt keep cough and "hock" expectoration abilities strong for airway clearance. SLP advised pt to esophageal precautions given his hx of frequent food impaction requiring dilatations.  Pt reports improved swallow since his EGD on 04/07/2021- .  He also isolates symptoms of retention to pharynx, suspect referrant from distal.  Reviewed heimlich maneuver and discussed "flap" that daughter states was not working properly using diagram.  All education completed with pt and his oldest daughter. After report filed, no SLP follow up indicated as pt educated to dysphagia mitigation strategies.   Kathleen Lime, [place] SLP [place] Office [phone] Cell [phone]

## 2021-04-08 NOTE — Progress Notes (Signed)
°  Progress Note   Patient: Gregory [name] UUV:[id number] DOB: 23-Jan-1933 DOA: 04/04/2021     3 DOS: the patient was seen and examined on 04/08/2021   Brief hospital course: Past medical history of A. fib, HTN, CAD, chronic diastolic CHF, restless leg syndrome.  Presents with complaints of difficulty swallowing and acute kidney injury. SP EGD with dilation.  Assessment and Plan Dysphagia. GI consulted. Underwent EGD with dilation. Currently tolerating oral diet. Will monitor overnight.  Liver cirrhosis. LFTs stable. Monitor for now.  Chronic diastolic CHF reduction of volume appears to be stable for now Monitor.  Paroxysmal A. fib. Oral Eliquis.  Will resume. Currently rate controlled. Monitor.  Essential hypertension. Blood pressure soft. Will monitor.  Question midodrine support.     Subjective: No nausea no vomiting no fever no chills  Remains fatigue and tired.  Minimal oral intake.  Objective Vitals:   04/07/21 2137 04/08/21 0607 04/08/21 0836 04/08/21 1352  BP: (!) 136/58 124/61 122/66 (!) 96/51  Pulse: (!) 55 99 78 81  Resp: 20 16  14   Temp: 98.2 F (36.8 C) 97.7 F (36.5 C)  (!) 97.2 F (36.2 C)  TempSrc: Oral Oral  Oral  SpO2: 92% 90%  90%  Weight:      Height:        General: Appear in mild distress, no Rash; Oral Mucosa Clear, moist. no Abnormal Neck Mass Or lumps, Conjunctiva normal  Cardiovascular: S1 and S2 Present, no Murmur, Respiratory: good respiratory effort, Bilateral Air entry present and CTA, no Crackles, no wheezes Abdomen: Bowel Sound present, Soft and no tenderness Extremities: no Pedal edema Neurology: alert and oriented to time, place, and person affect appropriate. no new focal deficit Gait not checked due to patient safety concerns   Data Reviewed:  CMP shows hypokalemia, stable renal function, Magnesium level low.  CBC shows leukocytosis but stable hemoglobin. Repeat labs tomorrow.  Family Communication: Daughter at  bedside.  Disposition: Status is: Inpatient  Remains inpatient appropriate because: Poor p.o. intake.  Monitor closely.     Author: Berle Mull, MD 04/08/2021 7:59 PM  For on call review [email or web address].

## 2021-04-08 NOTE — TOC Initial Note (Signed)
Transition of Care [facility]) - Initial/Assessment Note    Patient Details  Name: Gregory [name] MRN: [id number] Date of Birth: 08-18-1932  [facility]) CM/SW Contact:    Lynnell Catalan, RN Phone Number: 04/08/2021, 2:44 PM  Clinical Narrative:                 Spoke with pt and daughter at bedside for dc planning. Physical therapy recommendations for SNF explained. Pt and daughter state that they would rather pt go home and have home health physical therapy. Pt states that he has assistance 24hrs a day at home. He states he has a RW and BSC at home currently and doesn't need any other DME. Choice offered and [facility] chosen. [facility] liaison given referral for HHPT. Will need MD order for HHPT.  Expected Discharge Plan: [place] Barriers to Discharge: Continued Medical Work up   Patient Goals and CMS Choice Patient states their goals for this hospitalization and ongoing recovery are:: To go home. CMS Medicare.gov Compare Post Acute Care list provided to:: Patient Choice offered to / list presented to : Patient  Expected Discharge Plan and Services Expected Discharge Plan: [place]   Discharge Planning Services: CM Consult Post Acute Care Choice: [place] arrangements for the past 2 months: [place]: PT [place]: [place] Date [place]: 04/08/21 Time HH Agency Contacted: 1442 Representative spoke with at [place]: Tommi Rumps  Prior Living Arrangements/Services Living arrangements for the past 2 months: [place] Lives with:: Spouse Patient language and need for interpreter reviewed:: Yes Do you feel safe going back to the place where you live?: Yes      Need for Family Participation in Patient Care: Yes (Comment) Care giver support system in place?: Yes (comment)   Criminal Activity/Legal Involvement Pertinent to Current  Situation/Hospitalization: No - Comment as needed  Activities of Daily Living Home Assistive Devices/Equipment: Cane (specify quad or straight) ADL Screening (condition at time of admission) Patient's cognitive ability adequate to safely complete daily activities?: Yes Is the patient deaf or have difficulty hearing?: No Does the patient have difficulty seeing, even when wearing glasses/contacts?: No Does the patient have difficulty concentrating, remembering, or making decisions?: No Patient able to express need for assistance with ADLs?: Yes Does the patient have difficulty dressing or bathing?: Yes Independently performs ADLs?: No Communication: Needs assistance Is this a change from baseline?: Pre-admission baseline Dressing (OT): Needs assistance Is this a change from baseline?: Pre-admission baseline Grooming: Needs assistance Is this a change from baseline?: Pre-admission baseline Feeding: Needs assistance Is this a change from baseline?: Pre-admission baseline Bathing: Needs assistance Is this a change from baseline?: Pre-admission baseline Toileting: Needs assistance Is this a change from baseline?: Pre-admission baseline In/Out Bed: Needs assistance Is this a change from baseline?: Pre-admission baseline Walks in Home: Needs assistance Is this a change from baseline?: Pre-admission baseline Does the patient have difficulty walking or climbing stairs?: Yes Weakness of Legs: Both Weakness of Arms/Hands: None  Permission Sought/Granted Permission sought to share information with : Facility Art therapist granted to share information with : Yes, Verbal Permission Granted     Permission granted to share info w AGENCY: [facility]        Emotional Assessment Appearance:: Appears  stated age Attitude/Demeanor/Rapport: Gracious Affect (typically observed): Calm Orientation: : Oriented to Self, Oriented to Place, Oriented to  Time, Oriented to  Situation Alcohol / Substance Use: Not Applicable Psych Involvement: No (comment)  Admission diagnosis:  Dysphagia [R13.10] Dysphagia, unspecified type [R13.10] Patient Active Problem List   Diagnosis Date Noted   Chronic diastolic CHF (congestive heart failure) ([place]) 04/06/2021   Essential hypertension 04/06/2021   HLD (hyperlipidemia) 04/06/2021   Elevated liver enzymes 04/06/2021   Acute renal failure superimposed on stage 3a chronic kidney disease ([place]) 04/06/2021   Anemia, unspecified 04/06/2021   Hypothyroidism 04/06/2021   Restless leg syndrome 04/06/2021   Failure to thrive in adult 04/06/2021   Other cirrhosis of liver ([place]) 04/06/2021   Dysphagia 04/04/2021   GERD with stricture 01/26/2019   Hypothyroid 12/12/2018   Prediabetes 12/04/2018   Esophageal obstruction due to food impaction    Chronic anemia 01/13/2016   Chronic renal failure, stage 3 (moderate) ([place]) 08/06/2015   Vasovagal syncope    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 03/31/2011   Chronic a-fib ([place]) 03/31/2011   Cardiomyopathy, ischemic, improved 03/31/2011   CVA (cerebral vascular accident) ([place]) 03/31/2011   Renal calculi 03/31/2011   History of tobacco use, continues with chewing tobacco 03/31/2011   PCP:  Darreld Mclean, MD Pharmacy:   [facility] [place], [place] [address] [place] [address] Phone: [phone] Fax: [phone]     Social Determinants of Health (SDOH) Interventions    Readmission Risk  Interventions Readmission Risk Prevention Plan 04/08/2021  Transportation Screening Complete  PCP or Specialist Appt within 5-7 Days Complete  Home Care Screening Complete  Medication Review (RN CM) Complete  Some recent data might be hidden

## 2021-04-08 NOTE — Evaluation (Signed)
Clinical/Bedside Swallow Evaluation Patient Details  Name: Gregory [name] MRN: [id number] Date of Birth: 12-08-1932  Today's Date: 04/08/2021 Time: SLP Start Time (ACUTE ONLY): 53 SLP Stop Time (ACUTE ONLY): 1155 SLP Time Calculation (min) (ACUTE ONLY): 40 min  Past Medical History:  Past Medical History:  Diagnosis Date   Acute respiratory failure ([place])    Appendicitis with abscess 03/31/2011   Arthritis    CAD (coronary artery disease),CABG 1993-LIMA to the LAD, SVG to Om and SVG to PDA/PLA, patent on cath 2008. 2003   a. s/p CABG 2003. b. last cath 2008 with patent grafts.   Chronic atrial fibrillation (HCC)    permanent   Chronic systolic CHF (congestive heart failure) ([place])    a. Prior low EF, later normalized.   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 07/2015 - after holding Xarelto x 3 days for spinal procedure.   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 felt vasovagal in setting of pain from renal infarct.   Past Surgical History:  Past Surgical History:  Procedure Laterality Date   BALLOON DILATION N/A 05/22/2015   Procedure: BALLOON DILATION;  Surgeon: Gatha Mayer, MD;  Location: [facility] ENDOSCOPY;  Service: Endoscopy;  Laterality: N/A;   BALLOON DILATION N/A 12/15/2018   Procedure: BALLOON DILATION;  Surgeon: Gatha Mayer, MD;  Location: [facility]OS[facility];  Service: Endoscopy;  Laterality: N/A;   CARDIAC CATHETERIZATION  02/24/2002   reduced LV function, 60-70% prox RCA stenosis, 70% PLA ostial stenosis, 80% secondary branch of PLA stenosis - subsequent CABG (Dr. Jackie Plum)   Carotid  Doppler  06/2012   50-69% right bulb/prox ICA diameter reduction; 70-99% left bulb/prox ICA diameter reduction   CATARACT EXTRACTION W/ INTRAOCULAR LENS IMPLANT Bilateral 2013   COLONOSCOPY N/A 05/18/2016   Procedure: COLONOSCOPY;  Surgeon: Manus Gunning, MD;  Location: [facility] ENDOSCOPY;  Service: Gastroenterology;  Laterality: N/A;   CORONARY ANGIOPLASTY  07/05/2006   3 vessel CAD, patent LIMA to LAD, patentVG to OM, patent SVG to PDA & PLA, mild MR, severe LV systolic dysfunction (Dr. Gerrie Nordmann)   CORONARY ARTERY BYPASS GRAFT  03/01/2002   LIMA to LAD, reverse SVG to OM, reverse SVG to PDA of RCA, reverse SVG to PLA of RCA, ligation of LA appendage (Dr. Servando Snare)   ENTEROSCOPY N/A 05/20/2016   Procedure: ENTEROSCOPY;  Surgeon: Manus Gunning, MD;  Location: [place];  Service: Gastroenterology;  Laterality: N/A;   ESOPHAGOGASTRODUODENOSCOPY  02/01/2012   Procedure: ESOPHAGOGASTRODUODENOSCOPY (EGD);  Surgeon: Beryle Beams, MD;  Location: [facility];  Service: Endoscopy;  Laterality: N/A;   ESOPHAGOGASTRODUODENOSCOPY N/A 05/22/2015   Procedure: ESOPHAGOGASTRODUODENOSCOPY (EGD);  Surgeon: Gatha Mayer, MD;  Location: [facility];  Service: Endoscopy;  Laterality: N/A;   ESOPHAGOGASTRODUODENOSCOPY N/A 05/17/2016   Procedure: ESOPHAGOGASTRODUODENOSCOPY (EGD);  Surgeon: Juanita Craver, MD;  Location: [facility] ENDOSCOPY;  Service: Endoscopy;  Laterality: N/A;   ESOPHAGOGASTRODUODENOSCOPY N/A 09/17/2017   Procedure: ESOPHAGOGASTRODUODENOSCOPY (EGD);  Surgeon: Milus Banister, MD;  Location: [facility] ENDOSCOPY;  Service: Endoscopy;  Laterality: N/A;   ESOPHAGOGASTRODUODENOSCOPY Left 02/09/2020   Procedure: ESOPHAGOGASTRODUODENOSCOPY (EGD);  Surgeon: Lavena Bullion, DO;  Location: [facility] ENDOSCOPY;  Service: Gastroenterology;  Laterality: Left;   ESOPHAGOGASTRODUODENOSCOPY (EGD) WITH PROPOFOL N/A 12/15/2018   Procedure: ESOPHAGOGASTRODUODENOSCOPY (EGD) WITH PROPOFOL;  Surgeon: Gatha Mayer, MD;   [facility];  Service: Endoscopy;  Laterality: N/A;   ESOPHAGOGASTRODUODENOSCOPY (EGD) WITH PROPOFOL N/A 04/07/2021   Procedure: ESOPHAGOGASTRODUODENOSCOPY (EGD) WITH PROPOFOL;  Surgeon: Milus Banister, MD;  [facility];  Service: Endoscopy;  Laterality: N/A;   FOREIGN BODY REMOVAL  09/17/2017   Procedure: FOREIGN BODY REMOVAL;  Surgeon: Milus Banister, MD;  Location: [facility] ENDOSCOPY;  Service: Endoscopy;;   FOREIGN BODY REMOVAL  12/15/2018   Procedure: FOREIGN BODY REMOVAL;  Surgeon: Gatha Mayer, MD;  Location: [facility]OS[facility];  Service: Endoscopy;;   FOREIGN BODY REMOVAL  02/09/2020   Procedure: FOREIGN BODY REMOVAL;  Surgeon: Lavena Bullion, DO;  Location: [place];  Service: Gastroenterology;;   GIVENS CAPSULE STUDY N/A 05/18/2016   Procedure: GIVENS CAPSULE STUDY;  Surgeon: Manus Gunning, MD;  Location: [place];  Service: Gastroenterology;  Laterality: N/A;   LAPAROSCOPIC APPENDECTOMY  03/30/2011   Procedure: APPENDECTOMY LAPAROSCOPIC;  Surgeon: Joyice Faster. Cornett, MD;  Location: [place];  Service: General;  Laterality: N/A;   LEFT HEART CATHETERIZATION WITH CORONARY ANGIOGRAM N/A 01/24/2013   Procedure: LEFT HEART CATHETERIZATION WITH CORONARY ANGIOGRAM;  Surgeon: [name], MD;  Location: [facility] CATH LAB;  Service: Cardiovascular;  Laterality: N/A;  Right heart with grafts   LITHOTRIPSY     "once" (01/21/2013)   [place]  05/2009   persantine myoview - fixed moderate perfusion defect in inferior wall & lateral segment of apex (poor non-transmural infarction), minimal anterolateral periinfarct reversible ischemia seen, abnormal study, defects similar to 2006 study   RIGHT/LEFT HEART CATH AND CORONARY ANGIOGRAPHY N/A 09/17/2017   Procedure: RIGHT/LEFT HEART CATH AND CORONARY ANGIOGRAPHY;  Surgeon: Martinique, Peter M, MD;  Location: [place] CV LAB;  Service: Cardiovascular;  Laterality: N/A;   SPLENECTOMY, TOTAL  01/2013   TEE WITHOUT  CARDIOVERSION N/A 01/23/2013   Procedure: TRANSESOPHAGEAL ECHOCARDIOGRAM (TEE);  Surgeon: Sanda Klein, MD;  Location: [facility] ENDOSCOPY;  Service: Cardiovascular;  Laterality: N/A;   TRANSTHORACIC ECHOCARDIOGRAM  06/23/2012   EF 40-45%, mild LVH; mild AV regurg; mild MV regurg; LV mod-severely dilated; RV mildly dilated; systolic pressure borderline increased; RA mod dilated   HPI:  88 yo male adm to [facility] with dysphagia and odyonphagia. 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 Swallow evaluation ordered by hospitalist.  CXR Cardiomegaly with no acute process identified.    Assessment / Plan / Recommendation  Clinical Impression  Pt does present with facial nerve deficit likely from prior MBS - decreased labial closure.  Otherwise no focal CN deficits apparent.  Observed pt consuming 3 ounces water, Sprite, graham crackers and applesauce. No indication of dysphagia or aspiration even with 3 ounce [place] water screen.  Pt has dentures - and reports he wears them at night.  Advised he clean dentures and his gums/tongue nightly to decrease bacterial load.  As RN reported pt with coughing with water yesterday- suspect acute transient deficits due to impact of procedure.  Pt does endorse occasional coughing with water - since COVID.  He  has not had pneumonias.  Advised pt keep cough and "hock" expectoration abilities strong for airway clearance. SLP advised pt to esophageal precautions given his hx of frequent food impaction requiring dilatations.  Pt reports improved swallow since his EGD on 04/07/2021- .  He also isolates symptoms of retention to pharynx, suspect referrant from distal.  Reviewed heimlich maneuver and discussed "flap" that daughter states was not working properly using diagram.  All education completed with pt and  his oldest daughter. After report filed, no SLP follow up indicated as pt educated to dysphagia mitigation strategies. SLP Visit Diagnosis: Dysphagia, unspecified (R13.10)    Aspiration Risk  Mild aspiration risk    Diet Recommendation Regular;Thin liquid   Liquid Administration via: Cup;Straw Medication Administration: Whole meds with liquid Supervision: Patient able to self feed Compensations: Slow rate;Small sips/bites Postural Changes: Seated upright at 90 degrees;Remain upright for at least 30 minutes after po intake    Other  Recommendations Oral Care Recommendations: Oral care BID    Recommendations for follow up therapy are one component of a multi-disciplinary discharge planning process, led by the attending physician.  Recommendations may be updated based on patient status, additional functional criteria and insurance authorization.  Follow up Recommendations No SLP follow up      Assistance Recommended at Discharge None  Functional Status Assessment  N/a  Frequency and Duration     N/a       Prognosis   N/a     Swallow Study   General Date of Onset: 04/08/21 HPI: 88 yo male adm to [facility] with dysphagia and odyonphagia. PMH + for basal ganglia CVA with ongoing right leg weakness, food impaction s/p multiple endoscopies, Afib, benign 'double ring' GE stenosis was found at and just above gastroesophageal junction; dilated to 56mm per Dr Ardis Hughs note. Swallow evaluation ordered by hospitalist.  CXR Cardiomegaly with no acute process identified. Type of Study: Bedside Swallow Evaluation Previous Swallow Assessment: see hpi Diet Prior to this Study: Dysphagia 3 (soft);Thin liquids Temperature Spikes Noted: No History of Recent Intubation: No Behavior/Cognition: Alert;Cooperative Oral Cavity Assessment: Excessive secretions (on denture) Oral Care Completed by SLP: Yes Oral Cavity - Dentition: Dentures, top;Dentures, bottom Vision: Functional for self-feeding Self-Feeding  Abilities: Able to feed self Patient Positioning: Upright in bed Baseline Vocal Quality: Normal Volitional Cough: Strong Volitional Swallow: Able to elicit    Oral/Motor/Sensory Function Overall Oral Motor/Sensory Function: Mild impairment Facial Symmetry: Abnormal symmetry left   Ice Chips Ice chips: Not tested   Thin Liquid Thin Liquid: Within functional limits Presentation: Self Fed;Cup;Straw    Nectar Thick Nectar Thick Liquid: Not tested   Honey Thick Honey Thick Liquid: Not tested   Puree Puree: Within functional limits Presentation: Self Fed;Spoon   Solid     Solid: Within functional limits Presentation: Self Fredirick Lathe 04/08/2021,12:10 PM   Kathleen Lime, MS [place] Office [phone] Cell [phone]

## 2021-04-08 NOTE — Progress Notes (Addendum)
[facility] Progress Note    Since last GI note: EGD yesterday, see full report in epic.  He's tolerating D3 diet so far. No chest pain.  Says that food and liquids are going down easier since the dilation yesterday.    Objective: Vital signs in last 24 hours: Temp:  [97.7 F (36.5 C)-98.2 F (36.8 C)] 97.7 F (36.5 C) (01/24 0607) Pulse Rate:  [55-99] 78 (01/24 0836) Resp:  [16-20] 16 (01/24 0607) BP: (122-136)/(58-66) 122/66 (01/24 0836) SpO2:  [90 %-92 %] 90 % (01/24 0607) Last BM Date: 04/04/21 General: alert and oriented times 3 Heart: regular rate and rhythm No creptiance in chest, neck Abdomen: soft, non-tender, non-distended, normal bowel sounds   Lab Results: Recent Labs    04/06/21 0536 04/07/21 0518 04/08/21 0537  WBC 9.5 10.4 21.9*  HGB 9.7* 8.6* 9.2*  PLT 294 258 258  MCV 97.1 95.7 94.6   Recent Labs    04/06/21 0536 04/07/21 0518 04/08/21 0537  NA 134* 134* 134*  K 3.9 3.5 3.3*  CL 102 103 104  CO2 25 25 22   GLUCOSE 92 109* 110*  BUN 32* 21 17  CREATININE 1.77* 1.34* 1.31*  CALCIUM 7.8* 7.8* 7.7*   Recent Labs    04/06/21 0536 04/07/21 0518 04/08/21 0537  PROT 5.8* 5.6* 5.8*  ALBUMIN 1.6* 2.2* 2.0*  AST 82* 52* 41  ALT 45* 32 27  ALKPHOS 90 76 78  BILITOT 0.7 0.8 0.9   Recent Labs    04/07/21 0518 04/08/21 0537  INR 1.4* 1.6*    Medications: Scheduled Meds:  apixaban  2.5 mg Oral BID   carvedilol  3.125 mg Oral BID WC   feeding supplement  1 Container Oral TID BM   levothyroxine  25 mcg Oral Q0600   melatonin  3 mg Oral QHS   pantoprazole  40 mg Oral BID AC   potassium chloride  30 mEq Oral Once   rOPINIRole  0.25 mg Oral BID   rosuvastatin  40 mg Oral Daily   Continuous Infusions:  magnesium sulfate bolus IVPB     PRN Meds:.    Assessment/Plan: 88 y.o. male with benign distal esophagus rings, dilated yesterday with EGD  Improved clinically after EGD and dilation.  I'm Not sure why his WBC jumped to  21K overnight. He does not have any clinical signs of complication from the EGD, dilation.  I am going to advance his diet and order a repeat cbc for now. Will follow along.     Milus Banister, MD  04/08/2021, 11:49 AM [facility] Pager [phone]   Repeat CBC shows WBC still quite elevated. I am ordering CXR and UA now.

## 2021-04-09 LAB — CBC WITH DIFFERENTIAL/PLATELET
Abs Immature Granulocytes: 0.29 10*3/uL — ABNORMAL HIGH (ref 0.00–0.07)
Basophils Absolute: 0 10*3/uL (ref 0.0–0.1)
Basophils Relative: 0 %
Eosinophils Absolute: 0.1 10*3/uL (ref 0.0–0.5)
Eosinophils Relative: 1 %
HCT: 26.3 % — ABNORMAL LOW (ref 39.0–52.0)
Hemoglobin: 8.5 g/dL — ABNORMAL LOW (ref 13.0–17.0)
Immature Granulocytes: 2 %
Lymphocytes Relative: 8 %
Lymphs Abs: 1.3 10*3/uL (ref 0.7–4.0)
MCH: 31 pg (ref 26.0–34.0)
MCHC: 32.3 g/dL (ref 30.0–36.0)
MCV: 96 fL (ref 80.0–100.0)
Monocytes Absolute: 0.9 10*3/uL (ref 0.1–1.0)
Monocytes Relative: 5 %
Neutro Abs: 13.6 10*3/uL — ABNORMAL HIGH (ref 1.7–7.7)
Neutrophils Relative %: 84 %
Platelets: 225 10*3/uL (ref 150–400)
RBC: 2.74 MIL/uL — ABNORMAL LOW (ref 4.22–5.81)
RDW: 14.3 % (ref 11.5–15.5)
WBC: 16.2 10*3/uL — ABNORMAL HIGH (ref 4.0–10.5)
nRBC: 0 % (ref 0.0–0.2)

## 2021-04-09 LAB — BASIC METABOLIC PANEL
Anion gap: 6 (ref 5–15)
BUN: 18 mg/dL (ref 8–23)
CO2: 26 mmol/L (ref 22–32)
Calcium: 7.7 mg/dL — ABNORMAL LOW (ref 8.9–10.3)
Chloride: 105 mmol/L (ref 98–111)
Creatinine, Ser: 1.21 mg/dL (ref 0.61–1.24)
GFR, Estimated: 58 mL/min — ABNORMAL LOW (ref >60)
Glucose, Bld: 134 mg/dL — ABNORMAL HIGH (ref 70–99)
Potassium: 3.3 mmol/L — ABNORMAL LOW (ref 3.5–5.1)
Sodium: 137 mmol/L (ref 135–145)

## 2021-04-09 LAB — MAGNESIUM: Magnesium: 1.8 mg/dL (ref 1.7–2.4)

## 2021-04-09 LAB — ANTINUCLEAR ANTIBODIES, IFA: ANA Ab, IFA: NEGATIVE

## 2021-04-09 MED ORDER — BOOST BREEZE PO LIQD
1.0000 | Freq: Three times a day (TID) | ORAL | 0 refills | Status: DC
Start: 2021-04-09 — End: 2021-05-21

## 2021-04-09 MED ORDER — POTASSIUM CHLORIDE CRYS ER 20 MEQ PO TBCR
40.0000 meq | EXTENDED_RELEASE_TABLET | Freq: Once | ORAL | Status: AC
  Administered 2021-04-09: 12:00:00 40 meq via ORAL
  Filled 2021-04-09: qty 2

## 2021-04-09 MED ORDER — MELATONIN 3 MG PO TABS: 3.0000 mg | ORAL_TABLET | Freq: Every day | ORAL | 0 refills | Status: DC

## 2021-04-09 NOTE — Progress Notes (Addendum)
° ° ° °  [facility] Progress Note  CC:  Dysphagia  Subjective:  Says that swallowing still going well.  Sitting up in chair.  Objective:  Vital signs in last 24 hours: Temp:  [97.2 F (36.2 C)-99 F (37.2 C)] 98.1 F (36.7 C) (01/25 0610) Pulse Rate:  [81-85] 85 (01/25 0610) Resp:  [14-18] 18 (01/25 0610) BP: (96-111)/(49-69) 111/49 (01/25 0610) SpO2:  [90 %-95 %] 95 % (01/25 0610) Last BM Date: 04/04/21 General:  Alert, elderly, in NAD Heart:  Regular rate and rhythm; no murmurs Pulm:  CTAB.  No W/R/R. Abdomen:  Soft, non-distended.  BS present.  Non-tender. Extremities:  Without edema. Neurologic:  Alert and oriented x 4;  grossly normal neurologically.  Intake/Output from previous day: 01/24 0701 - 01/25 0700 In: 179 [P.O.:179] Out: [place] [Urine:645]  Lab Results: Recent Labs    04/08/21 0537 04/08/21 1211 04/09/21 0503  WBC 21.9* 21.0* 16.2*  HGB 9.2* 9.2* 8.5*  HCT 28.2* 28.8* 26.3*  PLT 258 227 225   BMET Recent Labs    04/07/21 0518 04/08/21 0537 04/09/21 0503  NA 134* 134* 137  K 3.5 3.3* 3.3*  CL 103 104 105  CO2 25 22 26   GLUCOSE 109* 110* 134*  BUN 21 17 18   CREATININE 1.34* 1.31* 1.21  CALCIUM 7.8* 7.7* 7.7*   LFT Recent Labs    04/08/21 0537  PROT 5.8*  ALBUMIN 2.0*  AST 41  ALT 27  ALKPHOS 78  BILITOT 0.9   PT/INR Recent Labs    04/07/21 0518 04/08/21 0537  LABPROT 17.4* 18.9*  INR 1.4* 1.6*   DG Chest 2 View  Result Date: 04/08/2021 CLINICAL DATA:  Dysphagia. EXAM: CHEST - 2 VIEW COMPARISON:  April 04, 2021. FINDINGS: Stable cardiomegaly. Status post coronary bypass graft. Increased bibasilar atelectasis or edema is noted. Bony thorax is unremarkable. IMPRESSION: Increased bibasilar atelectasis or edema is noted. Electronically Signed   By: Marijo Conception M.D.   On: 04/08/2021 16:20    Assessment / Plan: 88 y.o. male with benign distal esophagus rings, dilated with EGD 1/23.  Dysphagia improved.  Is scheduled  for follow-up with Dr. Carlean Purl on 3/8, which is in his discharge information.  GI signing off.    LOS: 4 days   Gregory [name]. Zehr  04/09/2021, 10:23 AM   _______________________________________________________________________________________________________________________________________  Velora Heckler GI MD note:  I reviewed the data and agree with the assessment and plan described above.  His swallowing is clearly better since EGD, dilation two days ago. HE will follow up with Dr. Carlean Purl in early March.    Owens Loffler, MD [facility] Pager [phone]

## 2021-04-09 NOTE — Progress Notes (Signed)
Pt discharged home with all belongings. Discharge education completed, no questions or concerns at this time. Encouraged pt to follow up with PCP should any needs arise following discharge.

## 2021-04-09 NOTE — Progress Notes (Signed)
Occupational Therapy Treatment Patient Details Name: Gregory [name] MRN: [id number] DOB: May 22, 1932 Today's Date: 04/09/2021   History of present illness Pt is an 88 y.o. male admitted 04/04/21 with Dysphagia.  Pt also with recent Covid-19 diagnosis approximately 3 weeks ago (negative testing upon admission).  PMHx A fib, HTN, CAD, Chronic Diastolic CHF, restless leg syndrome (RLS), CVA   OT comments  Patient needing mod A to power up to standing from bed and recliner chair with cues for technique. Limited carry over for stand to sit to reach back for eccentric control into chair. Once standing patient min A to take small shuffled steps first to recliner then after seated rest front/back to chair ~4 ft. Patient with minor posterior loss of balance backing up to chair with min A for safety. Instructed patient in exercises for UB/LB as patient wanting to get stronger and be more independent. Progressing towards acute OT goals.   Recommendations for follow up therapy are one component of a multi-disciplinary discharge planning process, led by the attending physician.  Recommendations may be updated based on patient status, additional functional criteria and insurance authorization.    Follow Up Recommendations  Skilled nursing-short term rehab (<3 hours/day)    Assistance Recommended at Discharge Frequent or constant Supervision/Assistance  Patient can return home with the following  A little help with walking and/or transfers;A little help with bathing/dressing/bathroom;Assistance with cooking/housework;Help with stairs or ramp for entrance   Equipment Recommendations  None recommended by OT       Precautions / Restrictions Precautions Precautions: Fall Restrictions Weight Bearing Restrictions: No       Mobility Bed Mobility Overal bed mobility: Needs Assistance Bed Mobility: Rolling, Sidelying to Sit Rolling: Min guard Sidelying to sit: Min assist       General bed mobility  comments: Verbal cues to sequence and min A to upright trunk to sitting.    Transfers Overall transfer level: Needs assistance Equipment used: Rolling walker (2 wheels) Transfers: Sit to/from Stand, Bed to chair/wheelchair/BSC Sit to Stand: Mod assist     Step pivot transfers: Min assist     General transfer comment: please see toilet transfer     Balance Overall balance assessment: Needs assistance Sitting-balance support: Feet supported Sitting balance-Leahy Scale: Fair     Standing balance support: Reliant on assistive device for balance Standing balance-Leahy Scale: Poor                             ADL either performed or assessed with clinical judgement   ADL Overall ADL's : Needs assistance/impaired                     Lower Body Dressing: Set up;Sitting/lateral leans Lower Body Dressing Details (indicate cue type and reason): to don shoes Toilet Transfer: Moderate assistance;Cueing for safety;Cueing for sequencing;Ambulation;Rolling walker (2 wheels) Toilet Transfer Details (indicate cue type and reason): Patient needing mod A to power up to standing first from edge of bed then from recliner chair. Once balance maintained patient min A with rolling walker to ambulate few feet in room         Functional mobility during ADLs: Minimal assistance;Moderate assistance;Rolling walker (2 wheels) General ADL Comments: Patient participate in functional mobility in room ~60ft ambulating front/back from recliner chair with mild posterior loss of balance backing up to chair with min A for safety.      Cognition Arousal/Alertness: Awake/alert Behavior During Therapy: [facility]  for tasks assessed/performed Overall Cognitive Status: Within Functional Limits for tasks assessed                                          Exercises Exercises: Other exercises Other Exercises Other Exercises: Instruct patient to perform seated marches, leg kicks, ankle  pumps and fist pumps few sets throughout the day, patient able to return demo each exercise x10 while seated in recliner.            Pertinent Vitals/ Pain       Pain Assessment Pain Assessment: Faces Faces Pain Scale: Hurts a little bit Pain Location: B feet Pain Descriptors / Indicators: Sore Pain Intervention(s): Monitored during session         Frequency  Min 2X/week        Progress Toward Goals  OT Goals(current goals can now be found in the care plan section)  Progress towards OT goals: Progressing toward goals  Acute Rehab OT Goals Patient Stated Goal: Get stronger OT Goal Formulation: With patient Time For Goal Achievement: 04/21/21 Potential to Achieve Goals: Good ADL Goals Pt Will Perform Grooming: with modified independence;standing Pt Will Perform Lower Body Dressing: sit to/from stand;sitting/lateral leans;with supervision Pt Will Transfer to Toilet: with supervision;ambulating;regular height toilet  Plan Discharge plan remains appropriate       AM-PAC OT "6 Clicks" Daily Activity     Outcome Measure   Help from another person eating meals?: A Little Help from another person taking care of personal grooming?: A Little Help from another person toileting, which includes using toliet, bedpan, or urinal?: A Lot Help from another person bathing (including washing, rinsing, drying)?: A Little Help from another person to put on and taking off regular upper body clothing?: A Little Help from another person to put on and taking off regular lower body clothing?: A Little 6 Click Score: 17    End of Session Equipment Utilized During Treatment: Gait belt;Rolling walker (2 wheels)  OT Visit Diagnosis: Unsteadiness on feet (R26.81);Muscle weakness (generalized) (M62.81)   Activity Tolerance Patient tolerated treatment well   Patient Left in chair;with call bell/phone within reach;with chair alarm set;with family/visitor present   Nurse Communication  Mobility status        Time: 8891-6945 OT Time Calculation (min): 24 min  Charges: OT General Charges $OT Visit: 1 Visit OT Treatments $Self Care/Home Management : 23-37 mins  Delbert Phenix OT OT pager: [place] 04/09/2021, 10:45 AM

## 2021-04-09 NOTE — Progress Notes (Signed)
Pt has been scheduled for a hospital follow up with Dr. Carlean Purl on 05/21/21 at 8:30 am. Pt currently admitted, will send letter to home address & instructions should be given at discharge.

## 2021-04-10 ENCOUNTER — Telehealth: Payer: Self-pay

## 2021-04-10 ENCOUNTER — Telehealth: Payer: Self-pay | Admitting: Family Medicine

## 2021-04-10 MED ORDER — PROCHLORPERAZINE MALEATE 10 MG PO TABS: 10.0000 mg | ORAL_TABLET | Freq: Two times a day (BID) | ORAL | 0 refills | Status: DC | PRN

## 2021-04-10 NOTE — Telephone Encounter (Signed)
Okay for refill for nausea?

## 2021-04-10 NOTE — Telephone Encounter (Signed)
Patient's daughter called back stating her dad just got discharged from the hospital where he go his throat stretched. Since being discharged, he has experienced nausea which is keeping him from eating. She would like to know if he could get a refill on the prochlorperazine (COMPAZINE) 10 MG tablet  that works really well on him. Daughter stated she would call back Monday to schedule the hospital f/u with Dr. Lorelei Pont. Please advise.

## 2021-04-10 NOTE — Telephone Encounter (Signed)
Transition Care Management Follow-up Telephone Call Date of discharge and from where: 04/09/21-[place] How have you been since you were released from the hospital? Information received from daughter (HIPAA compliant.) Notes patient has intermittent nausea. Very weak and increased difficulty with walking.Throat and surgical site of hurts when swallowing/eating. Drinking Boost and eating soup as tolerated. Dizziness occurs while eating. Dizziness stops about 30 minutes after eating. Denies CP, emesis, headaches, diarrhea, ABD pain, fever. Very alert and aware. BP 90/42 at 10:45 today. Last BM yesterday prior to discharge.  Any questions or concerns? No  Items Reviewed: Did the pt receive and understand the discharge instructions provided? Yes  Medications obtained and verified? Yes , daughter manages. Taking all medication as directed and without issue.  Any new allergies since your discharge? No  Dietary orders reviewed? Yes Do you have support at home? Yes   Home Care and Equipment/Supplies: Were home health services ordered? Yes, HHPT assessment today.   If so, what is the name of the agency? [facility]  Functional Questionnaire: (I = Independent and D = Dependent) ADLs: Family assist  Bathing/Dressing- Family assist  Meal Prep- Family assist  Eating- I  Maintaining continence- I  Transferring/Ambulation- [place]  Managing Meds- Daughter manages   Follow up appointments reviewed:  PCP Hospital f/u appt confirmed? Yes  Scheduled to see PCP on 04/21/21 @ 1:00. Patient only wants to PCP and is willing to reschedule sooner if availability presents on M/W/F afternoon or T/Th anytime. [facility] f/u appt confirmed? Yes  Scheduled to see GI on 05/21/21 and CVD on 05/21/21.  Are transportation arrangements needed? No  If their condition worsens, is the pt aware to call PCP or go to the Emergency Dept.? Yes Was the patient provided with contact information for the PCP's office or ED?  Yes Was to pt encouraged to call back with questions or concerns? Yes

## 2021-04-13 ENCOUNTER — Encounter (HOSPITAL_COMMUNITY): Payer: Self-pay | Admitting: *Deleted

## 2021-04-13 ENCOUNTER — Emergency Department (HOSPITAL_COMMUNITY): Payer: Medicare Other

## 2021-04-13 ENCOUNTER — Other Ambulatory Visit: Payer: Self-pay

## 2021-04-13 ENCOUNTER — Inpatient Hospital Stay (HOSPITAL_COMMUNITY)
Admission: EM | Admit: 2021-04-13 | Discharge: 2021-04-17 | DRG: 291 | Disposition: A | Discharge status: Home Health | Payer: Medicare Other | Attending: Internal Medicine | Admitting: Internal Medicine

## 2021-04-13 DIAGNOSIS — I10 Essential (primary) hypertension: Secondary | ICD-10-CM | POA: Diagnosis not present

## 2021-04-13 DIAGNOSIS — Z823 Family history of stroke: Secondary | ICD-10-CM

## 2021-04-13 DIAGNOSIS — Z885 Allergy status to narcotic agent status: Secondary | ICD-10-CM

## 2021-04-13 DIAGNOSIS — E039 Hypothyroidism, unspecified: Secondary | ICD-10-CM | POA: Diagnosis present

## 2021-04-13 DIAGNOSIS — I482 Chronic atrial fibrillation, unspecified: Secondary | ICD-10-CM | POA: Diagnosis present

## 2021-04-13 DIAGNOSIS — L89151 Pressure ulcer of sacral region, stage 1: Secondary | ICD-10-CM | POA: Diagnosis present

## 2021-04-13 DIAGNOSIS — Z87891 Personal history of nicotine dependence: Secondary | ICD-10-CM

## 2021-04-13 DIAGNOSIS — R0602 Shortness of breath: Secondary | ICD-10-CM | POA: Diagnosis not present

## 2021-04-13 DIAGNOSIS — Z951 Presence of aortocoronary bypass graft: Secondary | ICD-10-CM

## 2021-04-13 DIAGNOSIS — Z6826 Body mass index (BMI) 26.0-26.9, adult: Secondary | ICD-10-CM

## 2021-04-13 DIAGNOSIS — I495 Sick sinus syndrome: Secondary | ICD-10-CM | POA: Diagnosis not present

## 2021-04-13 DIAGNOSIS — Z8616 Personal history of COVID-19: Secondary | ICD-10-CM | POA: Diagnosis not present

## 2021-04-13 DIAGNOSIS — Z8673 Personal history of transient ischemic attack (TIA), and cerebral infarction without residual deficits: Secondary | ICD-10-CM

## 2021-04-13 DIAGNOSIS — I5021 Acute systolic (congestive) heart failure: Secondary | ICD-10-CM | POA: Diagnosis not present

## 2021-04-13 DIAGNOSIS — R6889 Other general symptoms and signs: Secondary | ICD-10-CM | POA: Diagnosis not present

## 2021-04-13 DIAGNOSIS — E43 Unspecified severe protein-calorie malnutrition: Secondary | ICD-10-CM | POA: Diagnosis not present

## 2021-04-13 DIAGNOSIS — I2510 Atherosclerotic heart disease of native coronary artery without angina pectoris: Secondary | ICD-10-CM | POA: Diagnosis not present

## 2021-04-13 DIAGNOSIS — R0902 Hypoxemia: Secondary | ICD-10-CM | POA: Diagnosis present

## 2021-04-13 DIAGNOSIS — E871 Hypo-osmolality and hyponatremia: Secondary | ICD-10-CM | POA: Diagnosis present

## 2021-04-13 DIAGNOSIS — Z8249 Family history of ischemic heart disease and other diseases of the circulatory system: Secondary | ICD-10-CM

## 2021-04-13 DIAGNOSIS — K219 Gastro-esophageal reflux disease without esophagitis: Secondary | ICD-10-CM | POA: Diagnosis not present

## 2021-04-13 DIAGNOSIS — R531 Weakness: Secondary | ICD-10-CM

## 2021-04-13 DIAGNOSIS — J40 Bronchitis, not specified as acute or chronic: Secondary | ICD-10-CM | POA: Diagnosis not present

## 2021-04-13 DIAGNOSIS — N1831 Chronic kidney disease, stage 3a: Secondary | ICD-10-CM | POA: Diagnosis not present

## 2021-04-13 DIAGNOSIS — I5023 Acute on chronic systolic (congestive) heart failure: Secondary | ICD-10-CM | POA: Diagnosis not present

## 2021-04-13 DIAGNOSIS — I509 Heart failure, unspecified: Secondary | ICD-10-CM

## 2021-04-13 DIAGNOSIS — D631 Anemia in chronic kidney disease: Secondary | ICD-10-CM | POA: Diagnosis not present

## 2021-04-13 DIAGNOSIS — I255 Ischemic cardiomyopathy: Secondary | ICD-10-CM | POA: Diagnosis present

## 2021-04-13 DIAGNOSIS — Z743 Need for continuous supervision: Secondary | ICD-10-CM | POA: Diagnosis not present

## 2021-04-13 DIAGNOSIS — R069 Unspecified abnormalities of breathing: Secondary | ICD-10-CM | POA: Diagnosis not present

## 2021-04-13 DIAGNOSIS — E782 Mixed hyperlipidemia: Secondary | ICD-10-CM | POA: Diagnosis not present

## 2021-04-13 DIAGNOSIS — I25810 Atherosclerosis of coronary artery bypass graft(s) without angina pectoris: Secondary | ICD-10-CM

## 2021-04-13 DIAGNOSIS — E038 Other specified hypothyroidism: Secondary | ICD-10-CM

## 2021-04-13 DIAGNOSIS — R509 Fever, unspecified: Secondary | ICD-10-CM | POA: Diagnosis not present

## 2021-04-13 DIAGNOSIS — I130 Hypertensive heart and chronic kidney disease with heart failure and stage 1 through stage 4 chronic kidney disease, or unspecified chronic kidney disease: Principal | ICD-10-CM | POA: Diagnosis present

## 2021-04-13 DIAGNOSIS — Z20822 Contact with and (suspected) exposure to covid-19: Secondary | ICD-10-CM | POA: Diagnosis not present

## 2021-04-13 DIAGNOSIS — I517 Cardiomegaly: Secondary | ICD-10-CM | POA: Diagnosis not present

## 2021-04-13 DIAGNOSIS — Z79890 Hormone replacement therapy (postmenopausal): Secondary | ICD-10-CM

## 2021-04-13 DIAGNOSIS — Z7901 Long term (current) use of anticoagulants: Secondary | ICD-10-CM

## 2021-04-13 DIAGNOSIS — I700 Atherosclerosis of aorta: Secondary | ICD-10-CM | POA: Diagnosis not present

## 2021-04-13 DIAGNOSIS — Z79899 Other long term (current) drug therapy: Secondary | ICD-10-CM

## 2021-04-13 HISTORY — DX: Food in esophagus causing other injury, initial encounter: T18.128A

## 2021-04-13 HISTORY — DX: Esophageal obstruction: K22.2

## 2021-04-13 LAB — URINALYSIS, ROUTINE W REFLEX MICROSCOPIC
Bilirubin Urine: NEGATIVE
Glucose, UA: NEGATIVE mg/dL
Ketones, ur: NEGATIVE mg/dL
Leukocytes,Ua: NEGATIVE
Nitrite: NEGATIVE
Protein, ur: NEGATIVE mg/dL
Specific Gravity, Urine: 1.01 (ref 1.005–1.030)
pH: 7 (ref 5.0–8.0)

## 2021-04-13 LAB — CBC WITH DIFFERENTIAL/PLATELET
Abs Immature Granulocytes: 0.09 10*3/uL — ABNORMAL HIGH (ref 0.00–0.07)
Basophils Absolute: 0 10*3/uL (ref 0.0–0.1)
Basophils Relative: 0 %
Eosinophils Absolute: 0.2 10*3/uL (ref 0.0–0.5)
Eosinophils Relative: 3 %
HCT: 26.6 % — ABNORMAL LOW (ref 39.0–52.0)
Hemoglobin: 8.5 g/dL — ABNORMAL LOW (ref 13.0–17.0)
Immature Granulocytes: 1 %
Lymphocytes Relative: 18 %
Lymphs Abs: 1.6 10*3/uL (ref 0.7–4.0)
MCH: 31.3 pg (ref 26.0–34.0)
MCHC: 32 g/dL (ref 30.0–36.0)
MCV: 97.8 fL (ref 80.0–100.0)
Monocytes Absolute: 0.7 10*3/uL (ref 0.1–1.0)
Monocytes Relative: 8 %
Neutro Abs: 6.3 10*3/uL (ref 1.7–7.7)
Neutrophils Relative %: 70 %
Platelets: 282 10*3/uL (ref 150–400)
RBC: 2.72 MIL/uL — ABNORMAL LOW (ref 4.22–5.81)
RDW: 14.8 % (ref 11.5–15.5)
WBC: 8.9 10*3/uL (ref 4.0–10.5)
nRBC: 0 % (ref 0.0–0.2)

## 2021-04-13 LAB — PROTIME-INR
INR: 1.2 (ref 0.8–1.2)
Prothrombin Time: 14.7 seconds (ref 11.4–15.2)

## 2021-04-13 LAB — BASIC METABOLIC PANEL
Anion gap: 5 (ref 5–15)
BUN: 20 mg/dL (ref 8–23)
CO2: 28 mmol/L (ref 22–32)
Calcium: 7.7 mg/dL — ABNORMAL LOW (ref 8.9–10.3)
Chloride: 99 mmol/L (ref 98–111)
Creatinine, Ser: 1.08 mg/dL (ref 0.61–1.24)
GFR, Estimated: >60 mL/min (ref >60)
Glucose, Bld: 117 mg/dL — ABNORMAL HIGH (ref 70–99)
Potassium: 4.4 mmol/L (ref 3.5–5.1)
Sodium: 132 mmol/L — ABNORMAL LOW (ref 135–145)

## 2021-04-13 LAB — URINALYSIS, MICROSCOPIC (REFLEX): Bacteria, UA: NONE SEEN

## 2021-04-13 LAB — RESP PANEL BY RT-PCR (FLU A&B, COVID) ARPGX2
Influenza A by PCR: NEGATIVE
Influenza B by PCR: NEGATIVE
SARS Coronavirus 2 by RT PCR: NEGATIVE

## 2021-04-13 LAB — TROPONIN I (HIGH SENSITIVITY)
Troponin I (High Sensitivity): 11 ng/L (ref <18)
Troponin I (High Sensitivity): 11 ng/L (ref <18)

## 2021-04-13 LAB — MAGNESIUM: Magnesium: 1.6 mg/dL — ABNORMAL LOW (ref 1.7–2.4)

## 2021-04-13 LAB — BRAIN NATRIURETIC PEPTIDE: B Natriuretic Peptide: 489.7 pg/mL — ABNORMAL HIGH (ref 0.0–100.0)

## 2021-04-13 MED ORDER — FUROSEMIDE 10 MG/ML IJ SOLN
40.0000 mg | Freq: Once | INTRAMUSCULAR | Status: AC
  Administered 2021-04-13: 40 mg via INTRAVENOUS
  Filled 2021-04-13: qty 4

## 2021-04-13 MED ORDER — FUROSEMIDE 10 MG/ML IJ SOLN
40.0000 mg | Freq: Two times a day (BID) | INTRAMUSCULAR | Status: DC
  Administered 2021-04-14 – 2021-04-15 (×3): 40 mg via INTRAVENOUS
  Filled 2021-04-13 (×3): qty 4

## 2021-04-13 MED ORDER — MAGNESIUM SULFATE 2 GM/50ML IV SOLN
2.0000 g | Freq: Once | INTRAVENOUS | Status: AC
  Administered 2021-04-13: 2 g via INTRAVENOUS
  Filled 2021-04-13: qty 50

## 2021-04-13 MED ORDER — ONDANSETRON HCL 4 MG PO TABS: 4.0000 mg | ORAL_TABLET | Freq: Four times a day (QID) | ORAL | Status: DC | PRN

## 2021-04-13 MED ORDER — ONDANSETRON HCL 4 MG/2ML IJ SOLN: 4.0000 mg | Freq: Four times a day (QID) | INTRAMUSCULAR | Status: DC | PRN

## 2021-04-13 MED ORDER — ENOXAPARIN SODIUM 40 MG/0.4ML IJ SOSY: 40.0000 mg | PREFILLED_SYRINGE | Freq: Every day | INTRAMUSCULAR | Status: DC

## 2021-04-13 MED ORDER — ACETAMINOPHEN 650 MG RE SUPP: 650.0000 mg | Freq: Four times a day (QID) | RECTAL | Status: DC | PRN

## 2021-04-13 MED ORDER — ACETAMINOPHEN 325 MG PO TABS
650.0000 mg | ORAL_TABLET | Freq: Four times a day (QID) | ORAL | Status: DC | PRN
  Administered 2021-04-15 – 2021-04-16 (×3): 650 mg via ORAL
  Filled 2021-04-13 (×3): qty 2

## 2021-04-13 MED ORDER — POLYETHYLENE GLYCOL 3350 17 G PO PACK: 17.0000 g | PACK | Freq: Every day | ORAL | Status: DC | PRN

## 2021-04-13 NOTE — H&P (Signed)
History and Physical    Gregory [name] KDT:[id number] DOB: 07/12/1932 DOA: 04/13/2021  PCP: Darreld Mclean, MD  Patient coming from: Home via EMS   Chief Complaint:  Chief Complaint  Patient presents with   Shortness of Breath     HPI:    88 year old male with past medical history of coronary artery disease status post CABG (2003), atrial fibrillation, hypothyroidism, hypertension, ischemic cardiomyopathy with systolic heart failure (Echo 02/2018 EF 40-45% up from nadir of 25%), chronic kidney disease stage IIIa, anemia of chronic disease, gastroesophageal reflux disease, hyperlipidemia and recurrent esophageal stricture and food impactions status post most recent dilation on 1/23 at [facility] who now presents to [facility] emergency department via EMS with complaints of shortness of breath and generalized weakness.  Patient explains that for approximately past 2 days he has been experiencing shortness of breath.  Shortness of breath was initially mild intensity but has become progressively more more severe.  Shortness of breath is worse with exertion and improved with rest.  Patient states that shortness breath is associated with increasing lower extremity swelling as well as cough productive with scant amounts of sputum.  Patient denies any associated chest pain, fever, sick contacts, recent travel.  Of note, patient was recently diagnosed with COVID-19 in December 2022 and was essentially asymptomatic.  Patient symptoms continue to worsen until EMS was contacted who promptly came to evaluate the patient and brought him into [facility] emergency department for evaluation.  Upon evaluation in the emergency department patient was found to have an elevated BNP of 489.7.  Patient was found to have mild vascular congestion on chest x-ray and clinically patient was felt to be volume overloaded.  Patient was administered 40 mg of intravenous Lasix.  The hospitalist group was then  called to assess the patient for admission to the hospital.  Review of Systems:   Review of Systems  Respiratory:  Positive for cough and shortness of breath.   Cardiovascular:  Positive for leg swelling.  All other systems reviewed and are negative.  Past Medical History:  Diagnosis Date   Acute respiratory failure ([place])    Appendicitis with abscess 03/31/2011   Arthritis    CAD (coronary artery disease),CABG 1993-LIMA to the LAD, SVG to Om and SVG to PDA/PLA, patent on cath 2008. 2003   a. s/p CABG 2003. b. last cath 2008 with patent grafts.   Chronic atrial fibrillation (HCC)    permanent   Chronic systolic CHF (congestive heart failure) ([place])    a. Prior low EF, later normalized.   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 07/2015 - after holding Xarelto x 3 days for spinal procedure.   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 felt vasovagal in setting of pain from renal infarct.    Past Surgical History:  Procedure Laterality Date   BALLOON DILATION N/A 05/22/2015   Procedure: BALLOON DILATION;  Surgeon: Glendell Docker  Simonne Maffucci, MD;  Location: [facility];  Service: Endoscopy;  Laterality: N/A;   BALLOON DILATION N/A 12/15/2018   Procedure: BALLOON DILATION;  Surgeon: Gatha Mayer, MD;  Location: [facility] ENDOSCOPY;  Service: Endoscopy;  Laterality: N/A;   CARDIAC CATHETERIZATION  02/24/2002   reduced LV function, 60-70% prox RCA stenosis, 70% PLA ostial stenosis, 80% secondary branch of PLA stenosis - subsequent CABG (Dr. Jackie Plum)   Carotid Doppler  06/2012   50-69% right bulb/prox ICA diameter reduction; 70-99% left bulb/prox ICA diameter reduction   CATARACT EXTRACTION W/ INTRAOCULAR LENS IMPLANT Bilateral 2013   COLONOSCOPY N/A 05/18/2016   Procedure: COLONOSCOPY;  Surgeon: Manus Gunning, MD;  Location: [facility] ENDOSCOPY;  Service: Gastroenterology;  Laterality: N/A;   CORONARY ANGIOPLASTY  07/05/2006   3 vessel CAD, patent LIMA to LAD, patentVG to OM, patent SVG to PDA & PLA, mild MR, severe LV systolic dysfunction (Dr. Gerrie Nordmann)   CORONARY ARTERY BYPASS GRAFT  03/01/2002   LIMA to LAD, reverse SVG to OM, reverse SVG to PDA of RCA, reverse SVG to PLA of RCA, ligation of LA appendage (Dr. Servando Snare)   ENTEROSCOPY N/A 05/20/2016   Procedure: ENTEROSCOPY;  Surgeon: Manus Gunning, MD;  Location: [place];  Service: Gastroenterology;  Laterality: N/A;   ESOPHAGOGASTRODUODENOSCOPY  02/01/2012   Procedure: ESOPHAGOGASTRODUODENOSCOPY (EGD);  Surgeon: Beryle Beams, MD;  Location: [facility];  Service: Endoscopy;  Laterality: N/A;   ESOPHAGOGASTRODUODENOSCOPY N/A 05/22/2015   Procedure: ESOPHAGOGASTRODUODENOSCOPY (EGD);  Surgeon: Gatha Mayer, MD;  Location: [facility];  Service: Endoscopy;  Laterality: N/A;   ESOPHAGOGASTRODUODENOSCOPY N/A 05/17/2016   Procedure: ESOPHAGOGASTRODUODENOSCOPY (EGD);  Surgeon: Juanita Craver, MD;  Location: [facility] ENDOSCOPY;  Service: Endoscopy;  Laterality: N/A;   ESOPHAGOGASTRODUODENOSCOPY N/A 09/17/2017   Procedure: ESOPHAGOGASTRODUODENOSCOPY (EGD);  Surgeon: Milus Banister, MD;  Location: [facility] ENDOSCOPY;  Service: Endoscopy;  Laterality: N/A;   ESOPHAGOGASTRODUODENOSCOPY Left 02/09/2020   Procedure: ESOPHAGOGASTRODUODENOSCOPY (EGD);  Surgeon: Lavena Bullion, DO;  Location: [facility] ENDOSCOPY;  Service: Gastroenterology;  Laterality: Left;   ESOPHAGOGASTRODUODENOSCOPY (EGD) WITH PROPOFOL N/A 12/15/2018   Procedure: ESOPHAGOGASTRODUODENOSCOPY (EGD) WITH PROPOFOL;  Surgeon: Gatha Mayer, MD;  Location: [facility] ENDOSCOPY;  Service: Endoscopy;  Laterality: N/A;   ESOPHAGOGASTRODUODENOSCOPY (EGD) WITH PROPOFOL N/A 04/07/2021   Procedure: ESOPHAGOGASTRODUODENOSCOPY (EGD) WITH PROPOFOL;  Surgeon: Milus Banister, MD;  Location: [facility] ENDOSCOPY;  Service: Endoscopy;  Laterality: N/A;   FOREIGN BODY REMOVAL  09/17/2017   Procedure: FOREIGN BODY REMOVAL;  Surgeon: Milus Banister, MD;  Location: [facility] ENDOSCOPY;  Service: Endoscopy;;   FOREIGN BODY REMOVAL  12/15/2018   Procedure: FOREIGN BODY REMOVAL;  Surgeon: Gatha Mayer, MD;  Location: [facility] ENDOSCOPY;  Service: Endoscopy;;   FOREIGN BODY REMOVAL  02/09/2020   Procedure: FOREIGN BODY REMOVAL;  Surgeon: Lavena Bullion, DO;  Location: [place];  Service: Gastroenterology;;   GIVENS CAPSULE STUDY N/A 05/18/2016   Procedure: GIVENS CAPSULE STUDY;  Surgeon: Manus Gunning, MD;  Location: [place];  Service: Gastroenterology;  Laterality: N/A;   LAPAROSCOPIC APPENDECTOMY  03/30/2011   Procedure: APPENDECTOMY LAPAROSCOPIC;  Surgeon: Joyice Faster. Cornett, MD;  Location: [place];  Service: General;  Laterality: N/A;   LEFT HEART CATHETERIZATION WITH CORONARY ANGIOGRAM N/A 01/24/2013   Procedure: LEFT HEART CATHETERIZATION WITH CORONARY ANGIOGRAM;  Surgeon: [name], MD;  Location: [facility] CATH LAB;  Service: Cardiovascular;  Laterality: N/A;  Right heart with grafts   LITHOTRIPSY     "once" (01/21/2013)   NM MYOCAR  PERF WALL MOTION  05/2009   persantine myoview - fixed moderate perfusion defect in inferior wall & lateral segment of apex (poor non-transmural infarction), minimal anterolateral periinfarct reversible ischemia seen, abnormal study, defects similar to 2006 study   RIGHT/LEFT HEART CATH AND CORONARY ANGIOGRAPHY N/A 09/17/2017   Procedure: RIGHT/LEFT HEART CATH AND CORONARY ANGIOGRAPHY;  Surgeon: Martinique, Peter M, MD;  Location: [place] CV LAB;  Service: Cardiovascular;  Laterality: N/A;   SPLENECTOMY, TOTAL  01/2013    TEE WITHOUT CARDIOVERSION N/A 01/23/2013   Procedure: TRANSESOPHAGEAL ECHOCARDIOGRAM (TEE);  Surgeon: Sanda Klein, MD;  Location: [facility] ENDOSCOPY;  Service: Cardiovascular;  Laterality: N/A;   TRANSTHORACIC ECHOCARDIOGRAM  06/23/2012   EF 40-45%, mild LVH; mild AV regurg; mild MV regurg; LV mod-severely dilated; RV mildly dilated; systolic pressure borderline increased; RA mod dilated     reports that he quit smoking about 35 years ago. His smoking use included cigarettes. He has a 40.00 pack-year smoking history. His smokeless tobacco use includes chew. He reports that he does not drink alcohol and does not use drugs.  Allergies  Allergen Reactions   Percocet [Oxycodone-Acetaminophen] Itching and Nausea Only    Family History  Problem Relation Age of Onset   Heart disease Father        rheumatic fever   Heart attack Brother 39   Stroke Mother    Stroke Brother 71   Stomach cancer Neg Hx    Rectal cancer Neg Hx    Prostate cancer Neg Hx    Pancreatic cancer Neg Hx    Ovarian cancer Neg Hx      Prior to Admission medications   Medication Sig Start Date End Date Taking? Authorizing Provider  acetaminophen (TYLENOL) 500 MG tablet Take 500 mg by mouth every 6 (six) hours as needed for moderate pain.    [provider]  apixaban (ELIQUIS) 2.5 MG TABS tablet TAKE 1 TABLET(2.5 MG) BY MOUTH TWICE DAILY 12/16/20   [name], [name], MD  carvedilol (COREG) 3.125 MG tablet Take 1 tablet (3.125 mg total) by mouth 2 (two) times daily with a meal. TAKE 1 TABLET(3.125 MG) BY MOUTH TWICE DAILY WITH A MEAL 01/24/21   Troy Sine, MD  ezetimibe (ZETIA) 10 MG tablet TAKE 1 TABLET(10 MG) BY MOUTH DAILY 03/24/21   Troy Sine, MD  levothyroxine (SYNTHROID) 25 MCG tablet TAKE 1 TABLET(25 MCG) BY MOUTH DAILY BEFORE BREAKFAST 03/31/21   Troy Sine, MD  melatonin 3 MG TABS tablet Take 1 tablet (3 mg total) by mouth at bedtime. 04/09/21   Lavina Hamman, MD  Nutritional Supplements  (FEEDING SUPPLEMENT, BOOST BREEZE,) LIQD Take 1 each by mouth 3 (three) times daily between meals. 04/09/21   Lavina Hamman, MD  pantoprazole (PROTONIX) 40 MG tablet TAKE 1 TABLET(40 MG) BY MOUTH TWICE DAILY 01/28/21   Troy Sine, MD  prochlorperazine (COMPAZINE) 10 MG tablet Take 1 tablet (10 mg total) by mouth 2 (two) times daily as needed for nausea or vomiting. 04/10/21   [name], [name], MD  rOPINIRole (REQUIP) 0.25 MG tablet TAKE 1 TABLET(0.25 MG) BY MOUTH TWICE DAILY Patient taking differently: Take 0.25 mg by mouth in the morning and at bedtime. TAKE 1 TABLET(0.25 MG) BY MOUTH TWICE DAILY 02/13/21   [name], [name], MD  rosuvastatin (CRESTOR) 40 MG tablet TAKE 1 TABLET(40 MG) BY MOUTH DAILY 08/19/20   Troy Sine, MD  tiZANidine (ZANAFLEX) 4 MG tablet TAKE 1 TABLET(4 MG) BY MOUTH EVERY  8 HOURS AS NEEDED FOR MUSCLE SPASMS. DO NOT COMBINE WITH TRAMADOL Patient taking differently: Take 4 mg by mouth every 8 (eight) hours as needed for muscle spasms. 11/27/20   [name], [name], MD  traMADol (ULTRAM) 50 MG tablet Take 1 tablet (50 mg total) by mouth every 6 (six) hours as needed. 03/25/21   Malvin Johns, MD    Physical Exam: Vitals:   04/13/21 2300 04/13/21 2343 04/14/21 0100 04/14/21 0200  BP: 130/61 (!) 141/60    Pulse: 68     Resp: (!) 22     Temp:  (!) 97.5 F (36.4 C)    TempSrc:  Oral    SpO2: 99% 100%    Weight:   80.7 kg 80.7 kg  Height:        Constitutional: Awake alert and oriented x3, no associated distress.   Skin: no rashes, no lesions, good skin turgor noted. Eyes: Pupils are equally reactive to light.  No evidence of scleral icterus or conjunctival pallor.  ENMT: Moist mucous membranes noted.  Posterior pharynx clear of any exudate or lesions.   Neck: normal, supple, no masses, no thyromegaly.  No evidence of jugular venous distension.   Respiratory: Significant bibasilar and mid field rales bilaterally, left greater than right.  No evidence of  associated wheezing.  Normal respiratory effort. No accessory muscle use.  Cardiovascular: Regular rate and rhythm, no murmurs / rubs / gallops.  Significant bilateral lower extremity pitting edema that tracks in the feet all the way up to the knees.  2+ pedal pulses. No carotid bruits.  Chest:   Nontender without crepitus or deformity.   Back:   Nontender without crepitus or deformity. Abdomen: Abdomen is soft and nontender.  No evidence of intra-abdominal masses.  Positive bowel sounds noted in all quadrants.   Musculoskeletal: No joint deformity upper and lower extremities. Good ROM, no contractures. Normal muscle tone.  Neurologic: CN 2-12 grossly intact. Sensation intact.  Patient moving all 4 extremities spontaneously.  Patient is following all commands.  Patient is responsive to verbal stimuli.   Psychiatric: Patient exhibits normal mood with appropriate affect.  Patient seems to possess insight as to their current situation.     Labs on Admission: I have personally reviewed following labs and imaging studies -   CBC: Recent Labs  Lab 04/07/21 0518 04/08/21 0537 04/08/21 1211 04/09/21 0503 04/13/21 1920  WBC 10.4 21.9* 21.0* 16.2* 8.9  NEUTROABS 8.3* 18.9*  --  13.6* 6.3  HGB 8.6* 9.2* 9.2* 8.5* 8.5*  HCT 26.4* 28.2* 28.8* 26.3* 26.6*  MCV 95.7 94.6 97.0 96.0 97.8  PLT 258 258 227 225 182   Basic Metabolic Panel: Recent Labs  Lab 04/07/21 0518 04/08/21 0537 04/09/21 0503 04/13/21 1920  NA 134* 134* 137 132*  K 3.5 3.3* 3.3* 4.4  CL 103 104 105 99  CO2 25 22 26 28   GLUCOSE 109* 110* 134* 117*  BUN 21 17 18 20   CREATININE 1.34* 1.31* 1.21 1.08  CALCIUM 7.8* 7.7* 7.7* 7.7*  MG 1.6* 1.5* 1.8 1.6*  PHOS 2.0* 2.0*  --   --    GFR: Estimated Creatinine Clearance: 48.1 mL/min (by C-G formula based on SCr of 1.08 mg/dL). Liver Function Tests: Recent Labs  Lab 04/07/21 0518 04/08/21 0537  AST 52* 41  ALT 32 27  ALKPHOS 76 78  BILITOT 0.8 0.9  PROT 5.6* 5.8*   ALBUMIN 2.2* 2.0*   No results for input(s): LIPASE, AMYLASE in the last 168  hours. No results for input(s): AMMONIA in the last 168 hours. Coagulation Profile: Recent Labs  Lab 04/07/21 0518 04/08/21 0537 04/13/21 1920  INR 1.4* 1.6* 1.2   Cardiac Enzymes: No results for input(s): CKTOTAL, CKMB, CKMBINDEX, TROPONINI in the last 168 hours. BNP (last 3 results) No results for input(s): PROBNP in the last 8760 hours. HbA1C: No results for input(s): HGBA1C in the last 72 hours. CBG: No results for input(s): GLUCAP in the last 168 hours. Lipid Profile: No results for input(s): CHOL, HDL, LDLCALC, TRIG, CHOLHDL, LDLDIRECT in the last 72 hours. Thyroid Function Tests: No results for input(s): TSH, T4TOTAL, FREET4, T3FREE, THYROIDAB in the last 72 hours. Anemia Panel: No results for input(s): VITAMINB12, FOLATE, FERRITIN, TIBC, IRON, RETICCTPCT in the last 72 hours. Urine analysis:    Component Value Date/Time   COLORURINE YELLOW 04/13/2021 2002   APPEARANCEUR CLEAR 04/13/2021 2002   LABSPEC 1.010 04/13/2021 2002   PHURINE 7.0 04/13/2021 2002   GLUCOSEU NEGATIVE 04/13/2021 2002   HGBUR MODERATE (A) 04/13/2021 2002   BILIRUBINUR NEGATIVE 04/13/2021 2002   BILIRUBINUR negative 03/20/2021 1346   BILIRUBINUR negative 01/20/2013 [place] 04/13/2021 2002   PROTEINUR NEGATIVE 04/13/2021 2002   UROBILINOGEN 0.2 03/20/2021 1346   UROBILINOGEN 1.0 03/30/2011 2224   NITRITE NEGATIVE 04/13/2021 2002   LEUKOCYTESUR NEGATIVE 04/13/2021 2002    Radiological Exams on Admission - Personally Reviewed: DG Chest Port 1 View  Result Date: 04/13/2021 CLINICAL DATA:  Shortness of breath EXAM: PORTABLE CHEST 1 VIEW COMPARISON:  04/08/2021 FINDINGS: Low lung volumes. No frank interstitial edema or focal consolidation. Mild left basilar opacity, likely atelectasis with trace left pleural effusion. Cardiomegaly. Postsurgical changes related to prior CABG. Thoracic aortic  atherosclerosis. Median sternotomy. IMPRESSION: Mild left basilar opacity, likely atelectasis with trace left pleural effusion. Electronically Signed   By: Julian Hy M.D.   On: 04/13/2021 19:55    EKG: Personally reviewed.  Rhythm is atrial fibrillation with heart rate of 62 bpm.  Evidence of intraventricular conduction delay.  No dynamic ST segment changes appreciated.  Assessment/Plan  * Acute on chronic systolic CHF (congestive heart failure) ([place])- (present on admission) Patient presenting with 2-day history of increasing shortness of breath and peripheral edema, several days after his esophageal stricture has been relieved Seems that patient has suddenly had the ability to tolerate oral intake and by doing so has likely ingested a good deal of salt and oral hydration Clinical evidence of significant peripheral edema and significant rales on examination consistent with volume overload Patient responding well to first dose of Lasix given in the emergency department, will continue to provide Lasix 40 mg IV twice daily Strict input and output monitoring Supplemental oxygen for bouts of hypoxia Monitoring renal function and electrolytes with serial chemistires Daily weights     Chronic kidney disease, stage 3a ([place])- (present on admission) Strict intake and output monitoring Creatinine near baseline Minimizing nephrotoxic agents as much as possible Serial chemistries to monitor renal function and electrolytes   CAD (coronary artery disease),CABG 1993-LIMA to the LAD, SVG to Om and SVG to PDA/PLA, patent on cath 2014.- (present on admission) Patient is currently chest pain free Monitoring patient on telemetry Continue home regimen of Eliquis, lipid lowering therapy and AV nodal blocking therapy   Atrial fibrillation, chronic ([place])- (present on admission) Currently rate controlled. Continue home regimen of anticoagulation Continue home regimen of rate controlling  agent Monitoring on telemetry   Essential hypertension- (present on admission) Resume patients home  regimen of oral antihypertensives Titrate antihypertensive regimen as necessary to achieve adequate BP control PRN intravenous antihypertensives for excessively elevated blood pressure    Mixed hyperlipidemia- (present on admission) Continuing home regimen of lipid lowering therapy.   GERD without esophagitis- (present on admission) Continuing home regimen of daily PPI therapy.   Hypomagnesemia- (present on admission) Replacing with intravenous magnesium sulfate Monitoring magnesium levels with serial chemistries.  Hypothyroidism- (present on admission) Resume home regimen of Synthroid    Pressure injury of sacral region, stage 1- (present on admission) Area of nonblanching erythema over the sacrum Frequent turns Mepilex over area If worsens will obtain wound care consultation      Code Status:  Full code  code status decision has been confirmed with: patient Family Communication: deferred   Status is: Inpatient  Remains inpatient appropriate because: Acute congestive heart failure requiring intravenous diuretics, close clinical monitoring on telemetry, serial blood testing and serial clinical assessments.        Vernelle Emerald MD [facility] Pager [phone]  If 7PM-7AM, please contact night-coverage [email or web address] Use universal [place] password for that web site. If you do not have the password, please call the hospital operator.  04/14/2021, 3:17 AM

## 2021-04-13 NOTE — ED Provider Notes (Signed)
[facility] EMERGENCY DEPARTMENT Provider Note   CSN: [id number] Arrival date & time: 04/13/21  1831     History  Chief Complaint  Patient presents with   Shortness of Breath    Gregory [name] is a 88 y.o. male.  He is brought in by EMS from home for increased shortness of breath and generalized weakness since last night.  Shortness of breath worse with exertion.  Generally weak and unable to use his walker.  He had COVID back in December.  He was just in the hospital for esophageal stricture and had a dilation procedure.  He said he has been eating well.  He has noticed increased swelling in his legs.  He used to be on diuretics but I do not see that on his medication list now.  He is on Eliquis for A. fib.  He said he is coughing up some phlegm.  No hemoptysis.  The history is provided by the patient and the spouse.  Shortness of Breath Severity:  Moderate Onset quality:  Gradual Duration:  2 days Timing:  Constant Progression:  Unchanged Chronicity:  New Relieved by:  Nothing Worsened by:  Activity Ineffective treatments:  Rest Associated symptoms: cough and sputum production   Associated symptoms: no abdominal pain, no chest pain, no fever, no headaches, no hemoptysis, no rash, no sore throat and no wheezing       Home Medications Prior to Admission medications   Medication Sig Start Date End Date Taking? Authorizing Provider  acetaminophen (TYLENOL) 500 MG tablet Take 500 mg by mouth every 6 (six) hours as needed for moderate pain.    [provider]  apixaban (ELIQUIS) 2.5 MG TABS tablet TAKE 1 TABLET(2.5 MG) BY MOUTH TWICE DAILY 12/16/20   [name], [name], MD  carvedilol (COREG) 3.125 MG tablet Take 1 tablet (3.125 mg total) by mouth 2 (two) times daily with a meal. TAKE 1 TABLET(3.125 MG) BY MOUTH TWICE DAILY WITH A MEAL 01/24/21   Troy Sine, MD  ezetimibe (ZETIA) 10 MG tablet TAKE 1 TABLET(10 MG) BY MOUTH DAILY 03/24/21   Troy Sine, MD  levothyroxine (SYNTHROID) 25 MCG tablet TAKE 1 TABLET(25 MCG) BY MOUTH DAILY BEFORE BREAKFAST 03/31/21   Troy Sine, MD  melatonin 3 MG TABS tablet Take 1 tablet (3 mg total) by mouth at bedtime. 04/09/21   Lavina Hamman, MD  Nutritional Supplements (FEEDING SUPPLEMENT, BOOST BREEZE,) LIQD Take 1 each by mouth 3 (three) times daily between meals. 04/09/21   Lavina Hamman, MD  pantoprazole (PROTONIX) 40 MG tablet TAKE 1 TABLET(40 MG) BY MOUTH TWICE DAILY 01/28/21   Troy Sine, MD  prochlorperazine (COMPAZINE) 10 MG tablet Take 1 tablet (10 mg total) by mouth 2 (two) times daily as needed for nausea or vomiting. 04/10/21   [name], [name], MD  rOPINIRole (REQUIP) 0.25 MG tablet TAKE 1 TABLET(0.25 MG) BY MOUTH TWICE DAILY 02/13/21   [name], [name], MD  rosuvastatin (CRESTOR) 40 MG tablet TAKE 1 TABLET(40 MG) BY MOUTH DAILY 08/19/20   Troy Sine, MD  tiZANidine (ZANAFLEX) 4 MG tablet TAKE 1 TABLET(4 MG) BY MOUTH EVERY 8 HOURS AS NEEDED FOR MUSCLE SPASMS. DO NOT COMBINE WITH TRAMADOL 11/27/20   [name], [name], MD  traMADol (ULTRAM) 50 MG tablet Take 1 tablet (50 mg total) by mouth every 6 (six) hours as needed. 03/25/21   Malvin Johns, MD      Allergies    Percocet [  oxycodone-acetaminophen]    Review of Systems   Review of Systems  Constitutional:  Positive for fatigue. Negative for fever.  HENT:  Negative for sore throat.   Eyes:  Negative for visual disturbance.  Respiratory:  Positive for cough, sputum production and shortness of breath. Negative for hemoptysis and wheezing.   Cardiovascular:  Positive for leg swelling. Negative for chest pain.  Gastrointestinal:  Negative for abdominal pain.  Genitourinary:  Negative for dysuria.  Musculoskeletal:  Positive for gait problem.  Skin:  Negative for rash.  Neurological:  Negative for headaches.   Physical Exam Updated Vital Signs BP (!) 141/60 (BP Location: Left Arm)    Pulse 76    Temp 97.9 F (36.6 C) (Oral)     Resp (!) 25    Ht 5\' 6"  (1.676 m)    Wt 80.7 kg    SpO2 100%    BMI 28.72 kg/m  Physical Exam Vitals and nursing note reviewed.  Constitutional:      General: He is not in acute distress.    Appearance: He is well-developed.  HENT:     Head: Normocephalic and atraumatic.  Eyes:     Conjunctiva/sclera: Conjunctivae normal.  Cardiovascular:     Rate and Rhythm: Normal rate and regular rhythm.     Heart sounds: No murmur heard. Pulmonary:     Effort: Pulmonary effort is normal. Tachypnea present. No respiratory distress.     Breath sounds: Examination of the right-lower field reveals rales. Examination of the left-lower field reveals rales. Rales present.  Abdominal:     Palpations: Abdomen is soft.     Tenderness: There is no abdominal tenderness.  Musculoskeletal:        General: No swelling.     Cervical back: Neck supple.     Right lower leg: No tenderness. Edema present.     Left lower leg: No tenderness. Edema present.  Skin:    General: Skin is warm and dry.     Capillary Refill: Capillary refill takes less than 2 seconds.  Neurological:     Mental Status: He is alert.  Psychiatric:        Mood and Affect: Mood normal.    ED Results / Procedures / Treatments   Labs (all labs ordered are listed, but only abnormal results are displayed) Labs Reviewed  BASIC METABOLIC PANEL - Abnormal; Notable for the following components:      Result Value   Sodium 132 (*)    Glucose, Bld 117 (*)    Calcium 7.7 (*)    All other components within normal limits  CBC WITH DIFFERENTIAL/PLATELET - Abnormal; Notable for the following components:   RBC 2.72 (*)    Hemoglobin 8.5 (*)    HCT 26.6 (*)    Abs Immature Granulocytes 0.09 (*)    All other components within normal limits  BRAIN NATRIURETIC PEPTIDE - Abnormal; Notable for the following components:   B Natriuretic Peptide 489.7 (*)    All other components within normal limits  MAGNESIUM - Abnormal; Notable for the following  components:   Magnesium 1.6 (*)    All other components within normal limits  URINALYSIS, ROUTINE W REFLEX MICROSCOPIC - Abnormal; Notable for the following components:   Hgb urine dipstick MODERATE (*)    All other components within normal limits  COMPREHENSIVE METABOLIC PANEL - Abnormal; Notable for the following components:   Sodium 134 (*)    Chloride 96 (*)    Glucose, Bld 104 (*)  Calcium 8.3 (*)    Total Protein 6.3 (*)    Albumin 1.9 (*)    AST 114 (*)    ALT 70 (*)    All other components within normal limits  CBC WITH DIFFERENTIAL/PLATELET - Abnormal; Notable for the following components:   RBC 3.09 (*)    Hemoglobin 9.7 (*)    HCT 29.2 (*)    All other components within normal limits  RESP PANEL BY RT-PCR (FLU A&B, COVID) [id number]  PROTIME-INR  MAGNESIUM  URINALYSIS, MICROSCOPIC (REFLEX)  TROPONIN I (HIGH SENSITIVITY)  TROPONIN I (HIGH SENSITIVITY)    EKG EKG Interpretation  Date/Time:  Sunday April 13 2021 18:59:45 EST Ventricular Rate:  62 PR Interval:    QRS Duration: 134 QT Interval:  445 QTC Calculation: 452 R Axis:   154 Text Interpretation: Atrial fibrillation Nonspecific intraventricular conduction delay Anterior infarct, old Borderline T abnormalities, inferior leads Low voltage limb leads Confirmed by Aletta Edouard [phone]) on 04/13/2021 7:03:50 PM  Radiology DG Chest Port 1 View  Result Date: 04/13/2021 CLINICAL DATA:  Shortness of breath EXAM: PORTABLE CHEST 1 VIEW COMPARISON:  04/08/2021 FINDINGS: Low lung volumes. No frank interstitial edema or focal consolidation. Mild left basilar opacity, likely atelectasis with trace left pleural effusion. Cardiomegaly. Postsurgical changes related to prior CABG. Thoracic aortic atherosclerosis. Median sternotomy. IMPRESSION: Mild left basilar opacity, likely atelectasis with trace left pleural effusion. Electronically Signed   By: Julian Hy M.D.   On: 04/13/2021 19:55    Procedures Procedures     Medications Ordered in ED Medications  acetaminophen (TYLENOL) tablet 650 mg (has no administration in time range)    Or  acetaminophen (TYLENOL) suppository 650 mg (has no administration in time range)  polyethylene glycol (MIRALAX / GLYCOLAX) packet 17 g (has no administration in time range)  ondansetron (ZOFRAN) tablet 4 mg (has no administration in time range)    Or  ondansetron (ZOFRAN) injection 4 mg (has no administration in time range)  furosemide (LASIX) injection 40 mg (has no administration in time range)  apixaban (ELIQUIS) tablet 2.5 mg (has no administration in time range)  carvedilol (COREG) tablet 3.125 mg (has no administration in time range)  levothyroxine (SYNTHROID) tablet 25 mcg (25 mcg Oral Given 04/14/21 0554)  ezetimibe (ZETIA) tablet 10 mg (has no administration in time range)  melatonin tablet 3 mg (has no administration in time range)  feeding supplement (BOOST / RESOURCE BREEZE) liquid 1 Container (has no administration in time range)  pantoprazole (PROTONIX) EC tablet 40 mg (has no administration in time range)  rosuvastatin (CRESTOR) tablet 40 mg (has no administration in time range)  tiZANidine (ZANAFLEX) tablet 4 mg (has no administration in time range)  hydrALAZINE (APRESOLINE) injection 10 mg (has no administration in time range)  magnesium sulfate IVPB 2 g 50 mL (0 g Intravenous Stopped 04/13/21 2200)  furosemide (LASIX) injection 40 mg (40 mg Intravenous Given 04/13/21 2056)    ED Course/ Medical Decision Making/ A&P Clinical Course as of 04/14/21 0857  Sun Apr 13, 2021  1954 Chest x-ray interpreted by me as cardiomegaly poor inspiration effort no clear infiltrate.  Awaiting radiology reading. [MB]  2008 Last cardiac echo 12/19 - Impressions:   - The patient was in atrial fibrillation. Normal LV size with mild    LV hypertrophy. EF 40-45%, basal to mid inferolateral and    anterolateral hypokinesis. Mild to moderate RV dilation with mild    to  moderately decreased RV systolic function. Mild aortic  insufficiency and mild mitral regurgitation. Biatrial    enlargement.  [MB]  2030 Discussed with [facility] hospitalist Dr. Cyd Silence.  He will evaluate the patient for admission. [MB]    Clinical Course User Index [MB] Hayden Rasmussen, MD                           Medical Decision Making Amount and/or Complexity of Data Reviewed Labs: ordered. Radiology: ordered.  Risk Prescription drug management. Decision regarding hospitalization.  JUDEA RICHES was evaluated in Emergency Department on 04/13/2021 for the symptoms described in the history of present illness. He was evaluated in the context of the global COVID-19 pandemic, which necessitated consideration that the patient might be at risk for infection with the SARS-CoV-2 virus that causes COVID-19. Institutional protocols and algorithms that pertain to the evaluation of patients at risk for COVID-19 are in a state of rapid change based on information released by regulatory bodies including the CDC and federal and state organizations. These policies and algorithms were followed during the patient's care in the ED.  This patient complains of increased shortness of breath dyspnea on exertion peripheral edema; this involves an extensive number of treatment Options and is a complaint that carries with it a high risk of complications and Morbidity. The differential includes CHF, COPD, pneumonia, COVID, flu, pneumothorax, ACS, PE  I ordered, reviewed and interpreted labs, which included CBC with normal white count, hemoglobin low stable from priors, chemistries fairly unremarkable, LFTs with elevated AST ALT and low albumin I ordered medication IV Lasix and magnesium I ordered imaging studies which included chest x-ray and I independently    visualized and interpreted imaging which showed clear infiltrate Additional history obtained from patient's family member and EMS Previous records  obtained and reviewed in epic including recent hospitalization I consulted [facility] hospitalist Dr. Cyd Silence and discussed lab and imaging findings  Critical Interventions: None  After the interventions stated above, I reevaluated the patient and found patient be hemodynamically stable.  Still with increased work of breathing.  From admission to the hospital for diuresis and further work-up of his shortness of breath.  Patient in agreement with plan.          Final Clinical Impression(s) / ED Diagnoses Final diagnoses:  Acute on chronic congestive heart failure, unspecified heart failure type ([place])  Generalized weakness  Hypomagnesemia    Rx / DC Orders ED Discharge Orders     None         Hayden Rasmussen, MD 04/14/21 [phone]

## 2021-04-13 NOTE — ED Triage Notes (Signed)
PT here via [facility], from home, for increased sob and generalized weakness since last night.  SOB increases on exertion and increased swelling to LE bil.  Tested positive for covid several weeks prior.  Vs 124/53 Sats 100% ra Rr 18 Hr 72 - afib Cbg 173

## 2021-04-14 ENCOUNTER — Inpatient Hospital Stay (HOSPITAL_COMMUNITY): Payer: Medicare Other

## 2021-04-14 DIAGNOSIS — I5021 Acute systolic (congestive) heart failure: Secondary | ICD-10-CM

## 2021-04-14 DIAGNOSIS — L89151 Pressure ulcer of sacral region, stage 1: Secondary | ICD-10-CM | POA: Diagnosis present

## 2021-04-14 LAB — CBC WITH DIFFERENTIAL/PLATELET
Abs Immature Granulocytes: 0.07 10*3/uL (ref 0.00–0.07)
Basophils Absolute: 0 10*3/uL (ref 0.0–0.1)
Basophils Relative: 0 %
Eosinophils Absolute: 0.3 10*3/uL (ref 0.0–0.5)
Eosinophils Relative: 3 %
HCT: 29.2 % — ABNORMAL LOW (ref 39.0–52.0)
Hemoglobin: 9.7 g/dL — ABNORMAL LOW (ref 13.0–17.0)
Immature Granulocytes: 1 %
Lymphocytes Relative: 16 %
Lymphs Abs: 1.6 10*3/uL (ref 0.7–4.0)
MCH: 31.4 pg (ref 26.0–34.0)
MCHC: 33.2 g/dL (ref 30.0–36.0)
MCV: 94.5 fL (ref 80.0–100.0)
Monocytes Absolute: 1 10*3/uL (ref 0.1–1.0)
Monocytes Relative: 10 %
Neutro Abs: 7.1 10*3/uL (ref 1.7–7.7)
Neutrophils Relative %: 70 %
Platelets: 306 10*3/uL (ref 150–400)
RBC: 3.09 MIL/uL — ABNORMAL LOW (ref 4.22–5.81)
RDW: 14.7 % (ref 11.5–15.5)
WBC: 10 10*3/uL (ref 4.0–10.5)
nRBC: 0 % (ref 0.0–0.2)

## 2021-04-14 LAB — COMPREHENSIVE METABOLIC PANEL
ALT: 70 U/L — ABNORMAL HIGH (ref 0–44)
AST: 114 U/L — ABNORMAL HIGH (ref 15–41)
Albumin: 1.9 g/dL — ABNORMAL LOW (ref 3.5–5.0)
Alkaline Phosphatase: 90 U/L (ref 38–126)
Anion gap: 9 (ref 5–15)
BUN: 19 mg/dL (ref 8–23)
CO2: 29 mmol/L (ref 22–32)
Calcium: 8.3 mg/dL — ABNORMAL LOW (ref 8.9–10.3)
Chloride: 96 mmol/L — ABNORMAL LOW (ref 98–111)
Creatinine, Ser: 1.06 mg/dL (ref 0.61–1.24)
GFR, Estimated: >60 mL/min (ref >60)
Glucose, Bld: 104 mg/dL — ABNORMAL HIGH (ref 70–99)
Potassium: 4 mmol/L (ref 3.5–5.1)
Sodium: 134 mmol/L — ABNORMAL LOW (ref 135–145)
Total Bilirubin: 0.5 mg/dL (ref 0.3–1.2)
Total Protein: 6.3 g/dL — ABNORMAL LOW (ref 6.5–8.1)

## 2021-04-14 LAB — ECHOCARDIOGRAM COMPLETE
Area-P 1/2: 6.8 cm2
Height: 66 in
P 1/2 time: 670 msec
S' Lateral: 3.8 cm
Weight: 2846.58 oz

## 2021-04-14 LAB — MAGNESIUM: Magnesium: 2 mg/dL (ref 1.7–2.4)

## 2021-04-14 MED ORDER — MELATONIN 3 MG PO TABS
3.0000 mg | ORAL_TABLET | Freq: Every day | ORAL | Status: DC
  Administered 2021-04-14 – 2021-04-15 (×2): 3 mg via ORAL
  Filled 2021-04-14 (×3): qty 1

## 2021-04-14 MED ORDER — PANTOPRAZOLE SODIUM 40 MG PO TBEC
40.0000 mg | DELAYED_RELEASE_TABLET | Freq: Two times a day (BID) | ORAL | Status: DC
  Administered 2021-04-14 – 2021-04-17 (×7): 40 mg via ORAL
  Filled 2021-04-14 (×7): qty 1

## 2021-04-14 MED ORDER — HYDRALAZINE HCL 20 MG/ML IJ SOLN: 10.0000 mg | Freq: Four times a day (QID) | INTRAMUSCULAR | Status: DC | PRN

## 2021-04-14 MED ORDER — BOOST / RESOURCE BREEZE PO LIQD CUSTOM
1.0000 | Freq: Three times a day (TID) | ORAL | Status: DC
  Administered 2021-04-14: 1 via ORAL

## 2021-04-14 MED ORDER — EZETIMIBE 10 MG PO TABS
10.0000 mg | ORAL_TABLET | Freq: Every day | ORAL | Status: DC
  Administered 2021-04-14 – 2021-04-17 (×4): 10 mg via ORAL
  Filled 2021-04-14 (×4): qty 1

## 2021-04-14 MED ORDER — TIZANIDINE HCL 4 MG PO TABS: 4.0000 mg | ORAL_TABLET | Freq: Three times a day (TID) | ORAL | Status: DC | PRN

## 2021-04-14 MED ORDER — LEVOTHYROXINE SODIUM 25 MCG PO TABS
25.0000 ug | ORAL_TABLET | Freq: Every day | ORAL | Status: DC
  Administered 2021-04-14 – 2021-04-17 (×4): 25 ug via ORAL
  Filled 2021-04-14 (×4): qty 1

## 2021-04-14 MED ORDER — CARVEDILOL 3.125 MG PO TABS
3.1250 mg | ORAL_TABLET | Freq: Two times a day (BID) | ORAL | Status: DC
  Administered 2021-04-14 – 2021-04-17 (×6): 3.125 mg via ORAL
  Filled 2021-04-14 (×7): qty 1

## 2021-04-14 MED ORDER — APIXABAN 2.5 MG PO TABS
2.5000 mg | ORAL_TABLET | Freq: Two times a day (BID) | ORAL | Status: DC
  Administered 2021-04-14 – 2021-04-17 (×7): 2.5 mg via ORAL
  Filled 2021-04-14 (×7): qty 1

## 2021-04-14 MED ORDER — ROSUVASTATIN CALCIUM 20 MG PO TABS
40.0000 mg | ORAL_TABLET | Freq: Every day | ORAL | Status: DC
  Administered 2021-04-14 – 2021-04-17 (×4): 40 mg via ORAL
  Filled 2021-04-14 (×4): qty 2

## 2021-04-14 MED ORDER — GUAIFENESIN-DM 100-10 MG/5ML PO SYRP
5.0000 mL | ORAL_SOLUTION | ORAL | Status: DC | PRN
  Filled 2021-04-14: qty 5

## 2021-04-14 NOTE — Assessment & Plan Note (Signed)
·   Replacing with intravenous magnesium sulfate  Monitoring magnesium levels with serial chemistries.

## 2021-04-14 NOTE — Assessment & Plan Note (Addendum)
Hyponatremia  -creatinine trended upto to 1.6, lasix/aldactone held -restart lasix tomorrow

## 2021-04-14 NOTE — Discharge Instructions (Signed)

## 2021-04-14 NOTE — Assessment & Plan Note (Addendum)
-   continue carvedilol.

## 2021-04-14 NOTE — Assessment & Plan Note (Addendum)
On pantoprazole

## 2021-04-14 NOTE — TOC Initial Note (Signed)
Transition of Care [facility]) - Initial/Assessment Note    Patient Details  Name: Gregory [name] MRN: [id number] Date of Birth: December 07, 1932  [facility] CM/SW Contact:    Zenon Mayo, RN Phone Number: 04/14/2021, 10:39 AM  Clinical Narrative:                 NCM spoke with patient and daughter,[name] , at the bedside.  Daughter states he has 24 hr care at home between his wife and eight children.  He has a rolling walker, BSC, shower chair, w/chair at home, he does not need any more DME.  She states he is good with is medications.  He has a scale but does not weigh daily.  He also has a bp cuff but does not check his bp often, he also has a pulse oximetry.  He is active with [facility] for [place]. Daughter states they would like to continue with [facility].  NCM asked if could add a HHRN for them for CHF disease management, daughter states ok.  She states they will transport him by car at discharge. NCM notified Tommi Rumps with [facility] that will need to add a HHRN. He states ok.   TOC will continue to follow.   Expected Discharge Plan: [place] Barriers to Discharge: Continued Medical Work up   Patient Goals and CMS Choice Patient states their goals for this hospitalization and ongoing recovery are:: return home with family CMS Medicare.gov Compare Post Acute Care list provided to:: Patient Represenative (must comment) Choice offered to / list presented to : Adult Children  Expected Discharge Plan and Services Expected Discharge Plan: [place] In-house Referral: NA Discharge Planning Services: CM Consult Post Acute Care Choice: [place] arrangements for the past 2 months: Single Family Home                   DME Agency: NA       HH Arranged: RN, Disease Management, PT [place] Agency: [place] Date [facility] Agency Contacted: 04/14/21 Time [place]: [id number] Representative spoke with at [place]: Tommi Rumps  Prior Living  Arrangements/Services Living arrangements for the past 2 months: [place] with:: Adult Children Patient language and need for interpreter reviewed:: Yes Do you feel safe going back to the place where you live?: Yes      Need for Family Participation in Patient Care: Yes (Comment) Care giver support system in place?: Yes (comment) Current home services: DME, Home PT (BSC, rolling walker, shower chair, w/chair, active with [facility] for HHPT) Criminal Activity/Legal Involvement Pertinent to Current Situation/Hospitalization: No - Comment as needed  Activities of Daily Living Home Assistive Devices/Equipment: Walker (specify type) ADL Screening (condition at time of admission) Patient's cognitive ability adequate to safely complete daily activities?: Yes Is the patient deaf or have difficulty hearing?: No Does the patient have difficulty seeing, even when wearing glasses/contacts?: No Does the patient have difficulty concentrating, remembering, or making decisions?: No Patient able to express need for assistance with ADLs?: Yes Does the patient have difficulty dressing or bathing?: Yes Independently performs ADLs?: No Communication: Needs assistance Dressing (OT): Needs assistance Grooming: Needs assistance Toileting: Needs assistance In/Out Bed: Needs assistance Walks in Home: Needs assistance Does the patient have difficulty walking or climbing stairs?: Yes Weakness of Legs: Both Weakness of Arms/Hands: None  Permission Sought/Granted  Emotional Assessment Appearance:: Appears stated age Attitude/Demeanor/Rapport: Engaged Affect (typically observed): Appropriate Orientation: : Oriented to Self, Oriented to Place, Oriented to  Time, Oriented to Situation Alcohol / Substance Use: Not Applicable Psych Involvement: No (comment)  Admission diagnosis:  Acute on chronic congestive heart failure ([place]) [I50.9] Patient Active Problem List   Diagnosis  Date Noted   Pressure injury of sacral region, stage 1 04/14/2021   Acute on chronic systolic CHF (congestive heart failure) ([place]) 04/13/2021   Chronic kidney disease, stage 3a ([place]) 04/13/2021   Hypomagnesemia 04/13/2021   Chronic diastolic CHF (congestive heart failure) ([place]) 04/06/2021   Essential hypertension 04/06/2021   Mixed hyperlipidemia 04/06/2021   Elevated liver enzymes 04/06/2021   Anemia, unspecified 04/06/2021   Hypothyroidism 04/06/2021   Restless leg syndrome 04/06/2021   Failure to thrive in adult 04/06/2021   Other cirrhosis of liver ([place]) 04/06/2021   GERD without esophagitis 01/26/2019   Hypothyroid 12/12/2018   Prediabetes 12/04/2018   Chronic anemia 01/13/2016   Chronic renal failure, stage 3 (moderate) ([place]) 08/06/2015   Vasovagal syncope    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 03/31/2011   Atrial fibrillation, chronic ([place]) 03/31/2011   Cardiomyopathy, ischemic, improved 03/31/2011   CVA (cerebral vascular accident) ([place]) 03/31/2011   Renal calculi 03/31/2011   History of tobacco use, continues with chewing tobacco 03/31/2011   PCP:  Darreld Mclean, MD Pharmacy:   [facility] [place], [place] - [address] AT [address] [place] [address] Phone: [phone] Fax: [phone]     Social Determinants of Health (SDOH) Interventions    Readmission Risk Interventions Readmission Risk Prevention Plan 04/14/2021 04/08/2021  Transportation Screening Complete Complete  PCP or Specialist Appt within 5-7 Days - Complete  PCP  or Specialist Appt within 3-5 Days Complete -  Home Care Screening - Complete  Medication Review (RN CM) - Complete  HRI or Home Care Consult Complete -  Social Work Consult for Recovery Care Planning/Counseling Complete -  Palliative Care Screening Not Applicable -  Medication Review (RN Care Manager) Complete -  Some recent data might be hidden

## 2021-04-14 NOTE — Assessment & Plan Note (Addendum)
Continue care to stage 1 pressure ulcer. PT and OT , out of bed to chair tid with meals

## 2021-04-14 NOTE — Consult Note (Signed)
° °  [facility] CM Inpatient Consult   04/14/2021  Gregory [name] 10/31/1932 [id number]  [place] Organization [ACO] Patient: [name]  Primary Care Provider:  [name], [name], MD, [place] [facility] is an embedded provider with a Chronic Care Management team and program, and is listed for the transition of care follow up and appointments.  Patient was screened for less than 7 days readmission and review for Embedded practice service needs for chronic care management.  Came by to speak with patient and he was receiving a bedside test.  Plan: A referral can be sent to the [place] Management and made aware of TOC needs for post hospital needs. Will continue to follow for progress and needs for transtion.  Please contact for further questions,  Natividad Brood, RN BSN [place] [facility] Liaison  [phone] business mobile phone Toll free office [phone]  Fax number: [phone] [email or web address]      .

## 2021-04-14 NOTE — Progress Notes (Signed)
Progress Note   Patient: Gregory [name] EXB:[id number] DOB: Feb 24, 1933 DOA: 04/13/2021     1 DOS: the patient was seen and examined on 04/14/2021   Brief hospital course: Gregory [name] was admitted to the hospital with the working diagnosis of acute on chronic heart failure decompensation.   88 yo male with the past medical history of coronary artery disease, sp CABG 2003, atrial fibrillation, HTN, heart failure, CKD, chronic anemia and GERD who presented with dyspnea. Recent hospitalization at [facility] 01/20 to 01/25 for dysphagia, underwent EGD and esophageal dilatation. Patient reported 2 days of worsening dyspnea, associated with lower extremity edema, and productive cough. On his initial physical examination his blood pressure was 141/60, HR 68, RR 22 and temp 97,5, 02 saturation 100%, his lungs had significant rales bilaterally, heart with S1 and S2 present, rhythmic with no gallops, abdomen soft, positive lower extremity edema.   Na 132, K 4,4 Cl 99, bicarbonate 28, glucose 117 and BUN 20 with cr at 1,0 BNP 489. High sensitive troponin is 11 and 11.  Wbc is 8,9, hgb 8,5 and hct 26,6, plt 282  SARS COVID 19 negative   Urine analysis with SG 1.010, 6-10 wbc   Chest radiograph with cardiomegaly and bilateral hilar vascular congestion.   EKG with 62 bpm, right axis with normal Qtc, atrial fibrillation rhythm, q wave V1 to V3, with poor R wave progression, no significant ST segment or T wave changes.   Assessment and Plan * Acute on chronic systolic CHF (congestive heart failure) ([place])- (present on admission) Improved volume status but not yet back to baseline.  Urine output documented at 1,890 ml Blood pressure systolic 102 to 725 mmHg. Warm lower extremities with pitting edema.  Plan to continue diuresis with IV furosemide to target further negative fluid balance.  Continue heart failure management with carvedilol.   Echocardiogram with LV EF 42% with no regional wall abnormalities,  preserved RV systolic function, borderline dilatation of aortic root.    CAD (coronary artery disease),CABG 1993-LIMA to the LAD, SVG to Om and SVG to PDA/PLA, patent on cath 2014.- (present on admission) Patient with no chest pain, no signs of acute coronary syndrome.  Continue medical therapy with statin and blood pressure control.    Atrial fibrillation, chronic ([place])- (present on admission) Continue rate control with carvedilol and anticoagulation with apixaban.  Continue with telemetry monitoring.    Chronic kidney disease, stage 3a ([place])- (present on admission) Renal function with serum cr at 1,0 with k at 4,0 and serum bicarbonate at 29 Plan to continue diuresis with furosemide and close follow up on renal function and electrolytes.    GERD without esophagitis- (present on admission) Patient is tolerating po well, continue pantoprazole    Essential hypertension- (present on admission) Patient is tolerating well diuresis with furosemide.  Plan to continue carvedilol.     Mixed hyperlipidemia- (present on admission) Continue with statin therapy    Hypothyroidism- (present on admission) On levothyroxine     Pressure injury of sacral region, stage 1- (present on admission) Continue care to stage 1 pressure ulcer. PT and OT , out of bed to chair tid with meals   Hypomagnesemia-resolved as of 04/14/2021, (present on admission) Replacing with intravenous magnesium sulfate Monitoring magnesium levels with serial chemistries.     Subjective: patient with improvement in dyspnea but continue to have lower extremity edema.  Objective BP (!) 119/50 (BP Location: Right Arm)    Pulse 66    Temp 98.3 F (36.8  C) (Oral)    Resp 20    Ht 5\' 6"  (1.676 m)    Wt 80.7 kg    SpO2 100%    BMI 28.72 kg/m   Neurology awake and alert ENT mild pallor but not icterus  Cardiovascular heart with S1 and S2 present with no gallops or rubs, positive systolic murmur at the right sternal  border. No JVD Positive lower extremity edema ++ pitting bilaterally  Pulmonary  no wheezing or rhonchi, positive rales  Abdominal soft and non tender Skin stage 1 pressure ulcer sacrum Musculoskeletal   Data Reviewed:    Family Communication: I spoke with patient's daughter at the bedside, we talked in detail about patient's condition, plan of care and prognosis and all questions were addressed.   Disposition: Status is: Inpatient  Remains inpatient appropriate because: Iv diuresis            Author: Tawni Millers, MD 04/14/2021 2:33 PM  For on call review [email or web address].

## 2021-04-14 NOTE — Assessment & Plan Note (Addendum)
Continue with ezetimibe and rosuvastatin

## 2021-04-14 NOTE — Plan of Care (Signed)
?  Problem: Education: ?Goal: Ability to demonstrate management of disease process will improve ?Outcome: Progressing ?  ?Problem: Activity: ?Goal: Capacity to carry out activities will improve ?Outcome: Progressing ?  ?Problem: Cardiac: ?Goal: Ability to achieve and maintain adequate cardiopulmonary perfusion will improve ?Outcome: Progressing ?  ?

## 2021-04-14 NOTE — Assessment & Plan Note (Addendum)
HR controlled with carvedilol, continue anticoagulation with apixaban.,

## 2021-04-14 NOTE — Discharge Summary (Signed)
Physician Discharge Summary   Patient: Gregory [name] MRN: [id number] DOB: 26-Jun-1932  Admit date:     04/04/2021  Discharge date: 04/09/2021  Discharge Physician: Berle Mull   PCP: Darreld Mclean, MD   Recommendations at discharge:   Follow-up with PCP in 1 week. Follow-up with GI as recommended.  Discharge Diagnoses Active Problems:   Atrial fibrillation, chronic (HCC)   Chronic diastolic CHF (congestive heart failure) (HCC)   Essential hypertension   Mixed hyperlipidemia   Elevated liver enzymes   Anemia, unspecified   Hypothyroidism   Restless leg syndrome   Failure to thrive in adult   Other cirrhosis of liver (HCC)  Principal Problem (Resolved):   Dysphagia Resolved Problems:   Acute renal failure superimposed on stage 3a chronic kidney disease [facility])   Hospital Course   Dysphagia. GI consulted. Underwent EGD with dilation. Currently tolerating oral diet.  Serum creatinine improving without IV fluids suggesting adequate hydration and oral intake.   Liver cirrhosis. LFTs stable. Monitor for now.   Chronic diastolic CHF reduction of volume appears to be stable for now Monitor.   Paroxysmal A. fib. Oral Eliquis.  Will resume. Currently rate controlled. Monitor.   Essential hypertension. Blood pressure soft.  Will discontinue Lasix and lisinopril on discharge.  Continue carvedilol.  Restless leg syndrome. Continue Requip on discharge.  Deconditioning. PT OT recommends SNF discharge. Family want to try home with home health.     Consultants: Gastroenterology  Procedures performed: EGD with dilation Disposition: Home health PT OT recommended SNF, patient and family wanted to go home with home health. Diet recommendation: Cardiac diet  DISCHARGE MEDICATION: Allergies as of 04/09/2021       Reactions   Percocet [oxycodone-acetaminophen] Itching, Nausea Only        Medication List     STOP taking these medications    furosemide 40 MG  tablet Commonly known as: LASIX   lisinopril 5 MG tablet Commonly known as: ZESTRIL       TAKE these medications    acetaminophen 500 MG tablet Commonly known as: TYLENOL Take 500 mg by mouth every 6 (six) hours as needed for moderate pain.   carvedilol 3.125 MG tablet Commonly known as: COREG Take 1 tablet (3.125 mg total) by mouth 2 (two) times daily with a meal. TAKE 1 TABLET(3.125 MG) BY MOUTH TWICE DAILY WITH A MEAL What changed: additional instructions   Eliquis 2.5 MG Tabs tablet Generic drug: apixaban TAKE 1 TABLET(2.5 MG) BY MOUTH TWICE DAILY What changed: See the new instructions.   ezetimibe 10 MG tablet Commonly known as: ZETIA TAKE 1 TABLET(10 MG) BY MOUTH DAILY What changed: See the new instructions.   feeding supplement (BOOST BREEZE) Liqd Take 1 each by mouth 3 (three) times daily between meals.   levothyroxine 25 MCG tablet Commonly known as: SYNTHROID TAKE 1 TABLET(25 MCG) BY MOUTH DAILY BEFORE BREAKFAST What changed: See the new instructions.   melatonin 3 MG Tabs tablet Take 1 tablet (3 mg total) by mouth at bedtime.   pantoprazole 40 MG tablet Commonly known as: PROTONIX TAKE 1 TABLET(40 MG) BY MOUTH TWICE DAILY What changed: See the new instructions.   rOPINIRole 0.25 MG tablet Commonly known as: REQUIP TAKE 1 TABLET(0.25 MG) BY MOUTH TWICE DAILY What changed: See the new instructions.   rosuvastatin 40 MG tablet Commonly known as: CRESTOR TAKE 1 TABLET(40 MG) BY MOUTH DAILY What changed: See the new instructions.   tiZANidine 4 MG tablet Commonly known as: ZANAFLEX  TAKE 1 TABLET(4 MG) BY MOUTH EVERY 8 HOURS AS NEEDED FOR MUSCLE SPASMS. DO NOT COMBINE WITH TRAMADOL What changed: See the new instructions.   traMADol 50 MG tablet Commonly known as: ULTRAM Take 1 tablet (50 mg total) by mouth every 6 (six) hours as needed. What changed: reasons to take this        Follow-up Information     [facility] Follow up.    Specialty: [place] Why: For home physical therapy Contact information: [address] [place] [address] [phone]         Gatha Mayer, MD Follow up on 05/21/2021.   Specialty: Gastroenterology Why: 8:30 am Contact information: [address] [place] [address] [phone]         [name], [name], MD. Schedule an appointment as soon as possible for a visit in 1 week(s).   Specialty: Family Medicine Why: with CBC and BMP Contact information: [place] [address] [phone]                 Discharge Exam: Danley Danker Weights   04/04/21 1000  Weight: 79 kg   Vitals:   04/08/21 0836 04/08/21 1352 04/08/21 2201 04/09/21 0610  BP: 122/66 (!) 96/51 111/69 (!) 111/49  Pulse: 78 81 84 85  Resp:  14 18 18   Temp:  (!) 97.2 F (36.2 C) 99 F (37.2 C) 98.1 F (36.7 C)  TempSrc:  Oral Oral Oral  SpO2:  90% 94% 95%  Weight:      Height:        General: Appear in mild distress, no Rash; Oral Mucosa Clear, moist. no Abnormal Neck Mass Or lumps, Conjunctiva normal  Cardiovascular: S1 and S2 Present, no Murmur, Respiratory: good respiratory effort, Bilateral Air entry present and CTA, no Crackles, no wheezes Abdomen: Bowel Sound present, Soft and no tenderness Extremities: no Pedal edema Neurology: alert and oriented to time, place, and person affect appropriate. no new focal deficit Gait not checked due to patient safety concerns   Condition at discharge: good  The results of significant diagnostics from this hospitalization (including imaging, microbiology, ancillary and laboratory) are listed below for reference.   Imaging Studies: DG Chest 2 View  Result Date: 04/08/2021 CLINICAL DATA:  Dysphagia. EXAM: CHEST - 2 VIEW COMPARISON:  April 04, 2021. FINDINGS: Stable cardiomegaly. Status post coronary bypass graft. Increased bibasilar atelectasis or edema is noted. Bony thorax is unremarkable. IMPRESSION:  Increased bibasilar atelectasis or edema is noted. Electronically Signed   By: Marijo Conception M.D.   On: 04/08/2021 16:20   DG Chest 2 View  Result Date: 03/20/2021 CLINICAL DATA:  Recent COVID infection EXAM: CHEST - 2 VIEW COMPARISON:  Chest x-ray dated September 14, 2017 FINDINGS: Stable cardiomegaly. Status post median sternotomy and CABG. Mild scattered linear opacities. No large pleural effusion or pneumothorax. IMPRESSION: Mild scattered linear opacities, possibly due to atelectasis or sequela of recent COVID infection. No focal consolidation. Electronically Signed   By: Yetta Glassman M.D.   On: 03/20/2021 14:27   DG Lumbar Spine Complete  Result Date: 03/25/2021 CLINICAL DATA:  Back pain. EXAM: LUMBAR SPINE - COMPLETE 4+ VIEW COMPARISON:  CT enterography 05/19/2016. FINDINGS: Spinal alignment is within normal limits. There is no evidence for acute fracture. There is mild chronic anterior wedge deformity of T12 and T11 which appears unchanged from prior. There is moderate disc space narrowing at L5-S1 and mild disc space narrowing at  L4-L5. Endplate osteophytes are seen throughout the lumbar spine similar to prior examination. There are atherosclerotic calcifications of the aorta. IMPRESSION: 1. No acute bony abnormality. 2. Stable mild/moderate degenerative changes. Electronically Signed   By: Ronney Asters M.D.   On: 03/25/2021 17:23   MR LUMBAR SPINE WO CONTRAST  Result Date: 03/25/2021 CLINICAL DATA:  Low back pain, cauda equina syndrome suspected. Bilateral leg pain. EXAM: MRI LUMBAR SPINE WITHOUT CONTRAST TECHNIQUE: Multiplanar, multisequence MR imaging of the lumbar spine was performed. No intravenous contrast was administered. COMPARISON:  Lumbar spine MRI 06/12/2015 FINDINGS: Segmentation:  Standard. Alignment:  Chronic trace retrolisthesis of L2 on L3 and L3 on L4. Vertebrae: No acute fracture, suspicious marrow lesion, or evidence of discitis. Unchanged mild chronic anterior wedging of the  T12 vertebral body. Scattered small Schmorl's nodes and multilevel degenerative endplate changes. Conus medullaris and cauda equina: Conus extends to the L1 level. Conus and cauda equina appear normal. Paraspinal and other soft tissues: Asymmetric left renal cortical thinning and scarring with slightly prominent left extrarenal pelvis. Disc levels: Disc desiccation throughout the lumbar spine. Moderate disc space narrowing at L4-5 and L5-S1. T12-L1: Negative. L1-2: Minimal disc bulging without stenosis, unchanged. L2-3: Retrolisthesis with bulging uncovered disc and mild-to-moderate right and mild left facet hypertrophy result in mild bilateral lateral recess stenosis and borderline bilateral neural foraminal stenosis, mildly progressed. No spinal stenosis. L3-4: Circumferential disc bulging and mild-to-moderate facet and ligamentum flavum hypertrophy result in moderate bilateral lateral recess stenosis and mild bilateral neural foraminal stenosis without significant spinal stenosis, unchanged. The L4 nerve roots may be affected in the lateral recesses. L4-5: Circumferential disc bulging and mild-to-moderate facet and ligamentum flavum hypertrophy result in mild-to-moderate bilateral lateral recess stenosis and mild right and mild-to-moderate bilateral neural foraminal stenosis without significant spinal stenosis, unchanged. L5-S1: Disc bulging and moderate facet and ligamentum flavum hypertrophy result in mild-to-moderate bilateral lateral recess stenosis and moderate right and severe left neural foraminal stenosis without significant generalized spinal stenosis, not significantly changed. IMPRESSION: 1. Mild progression of mild bilateral lateral recess stenosis at L2-3. 2. Unchanged disc and facet degeneration elsewhere with mild-to-moderate lateral recess and neural foraminal stenosis as above. Electronically Signed   By: Logan Bores M.D.   On: 03/25/2021 21:13   US Abdomen Complete  Result Date:  04/05/2021 CLINICAL DATA:  Elevated LFTs. EXAM: ABDOMEN ULTRASOUND COMPLETE COMPARISON:  CT chest, abdomen and pelvis 03/20/2021 FINDINGS: Gallbladder: The gallbladder wall appears mildly thickened measuring 4 mm. No gallstones, pericholecystic fluid or sonographic Murphy's sign. Common bile duct: Diameter: 4 mm Liver: The liver has a coarsened echotexture. Contour of the liver appears irregular and nodular. Portal vein is patent on color Doppler imaging with normal direction of blood flow towards the liver. IVC: Diminished exam detail.  No abnormality visualized. Pancreas: Diminished exam detail.  Visualized portion unremarkable. Spleen: Size and appearance within normal limits. Right Kidney: Length: 10 cm. Within the interpolar portions of the right kidney there is a cyst measuring 2.1 x 2.1 x 2.0 cm. Echogenicity within normal limits. No mass or hydronephrosis visualized. Left Kidney: Length: 9.4 cm. Echogenicity within normal limits. No mass or hydronephrosis visualized. Abdominal aorta: No aneurysm visualized. Other findings: Markedly diminished exam detail secondary to patient body habitus and overlying bowel gas. IMPRESSION: 1. Markedly diminished exam detail secondary to patient body habitus and overlying bowel gas. 2. Morphologic features of the liver suggestive of cirrhosis. 3. Gallbladder wall thickening. No gallstones, pericholecystic fluid or sonographic Murphy's sign noted. Electronically Signed  By: Kerby Moors M.D.   On: 04/05/2021 06:49   DG Chest Port 1 View  Result Date: 04/13/2021 CLINICAL DATA:  Shortness of breath EXAM: PORTABLE CHEST 1 VIEW COMPARISON:  04/08/2021 FINDINGS: Low lung volumes. No frank interstitial edema or focal consolidation. Mild left basilar opacity, likely atelectasis with trace left pleural effusion. Cardiomegaly. Postsurgical changes related to prior CABG. Thoracic aortic atherosclerosis. Median sternotomy. IMPRESSION: Mild left basilar opacity, likely atelectasis  with trace left pleural effusion. Electronically Signed   By: Julian Hy M.D.   On: 04/13/2021 19:55   DG Chest Port 1 View  Result Date: 04/04/2021 CLINICAL DATA:  Dysphagia EXAM: PORTABLE CHEST 1 VIEW COMPARISON:  Chest x-ray 03/25/2021 FINDINGS: Heart is enlarged. Mediastinum appears stable. Calcified plaques in the aortic arch. Cardiac surgical changes and median sternotomy wires. Pulmonary vasculature is within normal limits. No focal consolidation identified. No pleural effusion or pneumothorax. IMPRESSION: Cardiomegaly with no acute process identified. Electronically Signed   By: Ofilia Neas M.D.   On: 04/04/2021 10:49   DG Chest Port 1 View  Result Date: 03/25/2021 CLINICAL DATA:  Fever.  Positive COVID-19 test 03/14/2021. EXAM: PORTABLE CHEST 1 VIEW COMPARISON:  Chest two views 03/20/2021 FINDINGS: Status post median sternotomy. Cardiac silhouette is again mildly to moderately enlarged. Mediastinal contours are unchanged with calcification moderately seen within the aortic arch. Mildly decreased lung volumes. Mild scattered bilateral mid and lower lung linear densities, greatest within the left mid and lower lung, similar to prior. No focal airspace opacity to indicate pneumonia. No pulmonary edema, pleural effusion, or pneumothorax. No acute skeletal abnormality. IMPRESSION: No significant change from prior. Mild scattered linear densities that may represent atelectasis versus the sequelae of recent COVID infection. No focal airspace consolidation. Electronically Signed   By: Yvonne Kendall   On: 03/25/2021 19:06   DG Hips Bilat W or Wo Pelvis 3-4 Views  Result Date: 03/25/2021 CLINICAL DATA:  Bilateral upper leg pain EXAM: DG HIP (WITH OR WITHOUT PELVIS) 3-4V BILAT COMPARISON:  03/14/2021. FINDINGS: There is no evidence of hip fracture or dislocation. Hip joint spaces are maintained. Lower lumbar degenerative change. Calcific atherosclerosis. IMPRESSION: Negative. Electronically  Signed   By: Margaretha Sheffield M.D.   On: 03/25/2021 13:38   CT CHEST ABDOMEN PELVIS WO CONTRAST  Result Date: 03/20/2021 CLINICAL DATA:  Nausea, dysphagia, COVID pneumonia EXAM: CT CHEST, ABDOMEN AND PELVIS WITHOUT CONTRAST TECHNIQUE: Multidetector CT imaging of the chest, abdomen and pelvis was performed following the standard protocol without IV contrast. COMPARISON:  03/20/2021, 05/19/2016 FINDINGS: CT CHEST FINDINGS Cardiovascular: Unenhanced imaging of the heart and great vessels demonstrates mild cardiomegaly without pericardial effusion. Minimal gas within the right atrium likely from IV placement or phlebotomy. Normal caliber of the thoracic aorta. Postsurgical changes are seen from coronary artery bypass. Evaluation of the vascular lumen is limited without IV contrast. There is severe atherosclerosis of the aorta and native coronary vessels. Mediastinum/Nodes: Borderline enlarged lymph nodes measuring up to 13 mm in short axis in the subcarinal region. Punctate calcifications may suggest previous granulomatous disease. Thyroid, trachea, and esophagus are unremarkable. Lungs/Pleura: Basilar predominant interstitial and ground-glass opacities are identified, consistent with multifocal pneumonia or inflammation. No effusion or pneumothorax. The central airways are patent. Musculoskeletal: No acute or destructive bony lesions. Reconstructed images demonstrate no additional findings. CT ABDOMEN PELVIS FINDINGS Hepatobiliary: Unremarkable unenhanced appearance of the liver and gallbladder. Pancreas: Stable fatty infiltration of the pancreas. No acute inflammatory changes. Spleen: Stable appearance of the spleen, which may  reflect remote trauma or infarct. No focal abnormalities on this unenhanced exam. Adrenals/Urinary Tract: Cortical thinning and scarring within the left kidney unchanged. Calcifications of the bilateral renal hila are likely vascular. No urinary tract calculi or obstructive uropathy. The  adrenals are unremarkable. Bladder is moderately distended without filling defect. Stomach/Bowel: No bowel obstruction or ileus. No bowel wall thickening or inflammatory change. Scattered colonic diverticulosis without diverticulitis. Vascular/Lymphatic: Diffuse atherosclerosis of the aorta and its branches. Subcentimeter lymph nodes are seen within the central mesentery, with mild surrounding mesenteric fat stranding. Findings could reflect mesenteric adenitis or sequela of gastroenteritis. Reproductive: Prostate is unremarkable. Other: There is no free fluid or free gas. No abdominal wall hernia. Musculoskeletal: No acute or destructive bony lesions. Reconstructed images demonstrate no additional findings. IMPRESSION: 1. Scattered basilar predominant interstitial and ground-glass opacities, consistent with nonspecific inflammatory or infectious etiology. 2. Borderline enlarged mediastinal lymph nodes, likely reactive. 3. Subcentimeter central mesenteric lymph nodes with mild mesenteric fat stranding. This could reflect sequela of mesenteric adenitis or gastroenteritis. 4.  Aortic Atherosclerosis (ICD10-I70.0). Electronically Signed   By: Randa Ngo M.D.   On: 03/20/2021 17:46    Microbiology: Results for orders placed or performed during the hospital encounter of 04/04/21  Resp Panel by RT-PCR (Flu A&B, Covid) Nasopharyngeal Swab     Status: None   Collection Time: 04/05/21 10:02 PM   Specimen: Nasopharyngeal Swab; Nasopharyngeal(NP) swabs in vial transport medium  Result Value Ref Range Status   SARS Coronavirus 2 by RT PCR NEGATIVE NEGATIVE Final    Comment: (NOTE) SARS-CoV-2 target nucleic acids are NOT DETECTED.  The SARS-CoV-2 RNA is generally detectable in upper respiratory specimens during the acute phase of infection. The lowest concentration of SARS-CoV-2 viral copies this assay can detect is 138 copies/mL. A negative result does not preclude SARS-Cov-2 infection and should not be used  as the sole basis for treatment or other patient management decisions. A negative result may occur with  improper specimen collection/handling, submission of specimen other than nasopharyngeal swab, presence of viral mutation(s) within the areas targeted by this assay, and inadequate number of viral copies(<138 copies/mL). A negative result must be combined with clinical observations, patient history, and epidemiological information. The expected result is Negative.  Fact Sheet for Patients:  EntrepreneurPulse.com.au  Fact Sheet for Healthcare Providers:  IncredibleEmployment.be  This test is no t yet approved or cleared by the [place] FDA and  has been authorized for detection and/or diagnosis of SARS-CoV-2 by FDA under an Emergency Use Authorization (EUA). This EUA will remain  in effect (meaning this test can be used) for the duration of the COVID-19 declaration under Section 564(b)(1) of the Act, 21 U.S.C.section 360bbb-3(b)(1), unless the authorization is terminated  or revoked sooner.       Influenza A by PCR NEGATIVE NEGATIVE Final   Influenza B by PCR NEGATIVE NEGATIVE Final    Comment: (NOTE) The Xpert Xpress SARS-CoV-2/FLU/RSV plus assay is intended as an aid in the diagnosis of influenza from Nasopharyngeal swab specimens and should not be used as a sole basis for treatment. Nasal washings and aspirates are unacceptable for Xpert Xpress SARS-CoV-2/FLU/RSV testing.  Fact Sheet for Patients: EntrepreneurPulse.com.au  Fact Sheet for Healthcare Providers: IncredibleEmployment.be  This test is not yet approved or cleared by the [place] FDA and has been authorized for detection and/or diagnosis of SARS-CoV-2 by FDA under an Emergency Use Authorization (EUA). This EUA will remain in effect (meaning this test can be used) for the duration  of the COVID-19 declaration under Section 564(b)(1)  of the Act, 21 U.S.C. section 360bbb-3(b)(1), unless the authorization is terminated or revoked.  Performed at [facility], [place] [address] [place], [place] [address]     Labs: CBC: Recent Labs  Lab 04/08/21 0537 04/08/21 1211 04/09/21 0503  WBC 21.9* 21.0* 16.2*  NEUTROABS 18.9*  --  13.6*  HGB 9.2* 9.2* 8.5*  HCT 28.2* 28.8* 26.3*  MCV 94.6 97.0 96.0  PLT 258 227 914   Basic Metabolic Panel: Recent Labs  Lab 04/08/21 0537 04/09/21 0503  NA 134* 137  K 3.3* 3.3*  CL 104 105  CO2 22 26  GLUCOSE 110* 134*  BUN 17 18  CREATININE 1.31* 1.21  CALCIUM 7.7* 7.7*  MG 1.5* 1.8  PHOS 2.0*  --    Liver Function Tests: Recent Labs  Lab 04/08/21 0537  AST 41  ALT 27  ALKPHOS 78  BILITOT 0.9  PROT 5.8*  ALBUMIN 2.0*   CBG: No results for input(s): GLUCAP in the last 168 hours.  Discharge time spent: greater than 30 minutes.  Signed: Berle Mull, MD [facility]

## 2021-04-14 NOTE — Assessment & Plan Note (Addendum)
Continue with levothyroxine  

## 2021-04-14 NOTE — Progress Notes (Signed)
CCMD called pt heart rate goes down to 35. Upon assessment pt  no complaints or symptoms. MD notified.

## 2021-04-14 NOTE — Evaluation (Signed)
Physical Therapy Evaluation Patient Details Name: Gregory [name] MRN: [id number] DOB: [birth year] Today's Date: 04/14/2021  History of Present Illness  The pt is an 88 yo male presenting 1/29 with SOB, LE edema, and generalized weakness since d/c on 1/25. Found to have volume overload and elevated BNP. Of note, pt reccently admitted 1/20-1/25 with decreased appetite, upper abdominal pain, and dysphagia. Pt underwent EGD and dilation on 1/23 and was d/c home with 24/7 assist. PMH includes: CAD s/p CABG 2003, afib, HTN, ischemic cardiomyopathy with systolic heart failure, CKD III, chronic anemia, HLD, recurrent esophageal stricture, and failure to thrive.   Clinical Impression  Pt in bed upon arrival of PT, agreeable to evaluation at this time. Prior to admission the pt was mobilizing short distances with use of RW and assist from family, receiving HH therapies and 24/7 assist from family. The pt now presents with similar limitations in functional mobility, strength, power, dynamic stability, coordination, and endurance, and will continue to benefit from skilled PT to address these deficits. He was able to complete X6 sit-stand transfers through the session, progressing from needing modA due to poor power and posterior lean to needing only minA with RW and sequential cues. The pt is slow to rise, steps with poor clearance bilaterally which the pt and his family report are baseline after his stroke. Will continue to benefit from skilled PT to progress functional power and endurance, but will be safe to return home with family support when medically cleared.         Recommendations for follow up therapy are one component of a multi-disciplinary discharge planning process, led by the attending physician.  Recommendations may be updated based on patient status, additional functional criteria and insurance authorization.  Follow Up Recommendations Home health PT    Assistance Recommended at Discharge  Frequent or constant Supervision/Assistance  Patient can return home with the following  A little help with walking and/or transfers;A little help with bathing/dressing/bathroom;Help with stairs or ramp for entrance    Equipment Recommendations None recommended by PT  Recommendations for Other Services       Functional Status Assessment Patient has had a recent decline in their functional status and demonstrates the ability to make significant improvements in function in a reasonable and predictable amount of time.     Precautions / Restrictions Precautions Precautions: Fall Restrictions Weight Bearing Restrictions: No      Mobility  Bed Mobility Overal bed mobility: Needs Assistance Bed Mobility: Supine to Sit     Supine to sit: Min guard     General bed mobility comments: increased time and cues to complete movement, pt using bed rail and HOB elevated but no assist given    Transfers Overall transfer level: Needs assistance Equipment used: Rolling walker (2 wheels) Transfers: Sit to/from Stand Sit to Stand: Mod assist, Min assist           General transfer comment: progressed from [place] initially to minA with repeated attempts. initially with posterior lean needing support and cues for repositioning to maintain. x6 in session    Ambulation/Gait Ambulation/Gait assistance: Min assist, Mod assist Gait Distance (Feet): 15 Feet Assistive device: Rolling walker (2 wheels) Gait Pattern/deviations: Step-to pattern, Decreased stride length, Decreased dorsiflexion - right, Decreased dorsiflexion - left, Decreased step length - left Gait velocity: decreased Gait velocity interpretation: <1.31 ft/sec, indicative of household ambulator   General Gait Details: small shuffling steps with pooradvancement bilaterally (but L worse than R).     Balance  Overall balance assessment: Needs assistance Sitting-balance support: Feet supported Sitting balance-Leahy Scale: Fair    Postural control: Posterior lean Standing balance support: Reliant on assistive device for balance Standing balance-Leahy Scale: Poor Standing balance comment: minA with posterior lean initially                             Pertinent Vitals/Pain Pain Assessment Pain Assessment: No/denies pain Pain Intervention(s): Monitored during session    Home Living Family/patient expects to be discharged to:: Private residence Living Arrangements: Spouse/significant other Available Help at Discharge: Family;Available 24 hours/day Type of Home: House Home Access: Stairs to enter   [employer] of Steps: 2   Home Layout: One level Home Equipment: Cane - single point;Wheelchair - Publishing copy (2 wheels) Additional Comments: pt with multiple family members all assisting    Prior Function Prior Level of Function : Needs assist       Physical Assist : ADLs (physical);Mobility (physical)     Mobility Comments: pt has returned to some ambulation with use of RW after deconditioning from covid. states he still need physical assist to stand and supervision for gait ADLs Comments: pt reports family assist to get dressed, assists with transfers to toilet     Hand Dominance   Dominant Hand: Right    Extremity/Trunk Assessment   Upper Extremity Assessment Upper Extremity Assessment: Generalized weakness    Lower Extremity Assessment Lower Extremity Assessment: Generalized weakness;LLE deficits/detail;RLE deficits/detail (significant bilateral edema, poor power as pt unable to stand without dependenc eon UE) LLE Deficits / Details: hx of L weakness since CVA per family, significant bilateral edema    Cervical / Trunk Assessment Cervical / Trunk Assessment: Normal  Communication   Communication: HOH  Cognition Arousal/Alertness: Awake/alert Behavior During Therapy: WFL for tasks assessed/performed Overall Cognitive Status: Within Functional Limits for tasks  assessed                                 General Comments: pt oriented and able to answer most questions appropriately. soiled in urine upon arrival and either limited awareness or apathy to situation        General Comments General comments (skin integrity, edema, etc.): pt soiled in urine upon arrival, assisted to be cleaned and changed    Exercises Other Exercises Other Exercises: calf raises x 10 Other Exercises: sit-stand with minA from recliner x5   Assessment/Plan    PT Assessment Patient needs continued PT services  PT Problem List Decreased mobility;Decreased balance;Decreased strength;Decreased activity tolerance;Decreased knowledge of use of DME       PT Treatment Interventions DME instruction;Gait training;Balance training;Therapeutic exercise;Functional mobility training;Therapeutic activities;Patient/family education    PT Goals (Current goals can be found in the Care Plan section)  Acute Rehab PT Goals Patient Stated Goal: return to walking in the home PT Goal Formulation: With patient/family Time For Goal Achievement: 04/28/21 Potential to Achieve Goals: Good    Frequency Min 2X/week        AM-PAC PT "6 Clicks" Mobility  Outcome Measure Help needed turning from your back to your side while in a flat bed without using bedrails?: A Little Help needed moving from lying on your back to sitting on the side of a flat bed without using bedrails?: A Little Help needed moving to and from a bed to a chair (including a wheelchair)?: A Little Help needed standing up  from a chair using your arms (e.g., wheelchair or bedside chair)?: A Little Help needed to walk in hospital room?: A Lot Help needed climbing 3-5 steps with a railing? : A Lot 6 Click Score: 16    End of Session Equipment Utilized During Treatment: Gait belt Activity Tolerance: Patient tolerated treatment well Patient left: in chair;with call bell/phone within reach;with chair alarm  set;with family/visitor present Nurse Communication: Mobility status PT Visit Diagnosis: Difficulty in walking, not elsewhere classified (R26.2);Muscle weakness (generalized) (M62.81)    Time: 4037-5436 PT Time Calculation (min) (ACUTE ONLY): 34 min   Charges:   PT Evaluation $PT Eval Low Complexity: 1 Low PT Treatments $Therapeutic Exercise: 8-22 mins        West Carbo, PT, DPT   Acute Rehabilitation Department Pager #: [phone]  Sandra Cockayne 04/14/2021, 9:53 AM

## 2021-04-14 NOTE — Progress Notes (Signed)
°  Echocardiogram 2D Echocardiogram has been performed.  Darlina Sicilian M 04/14/2021, 11:49 AM

## 2021-04-14 NOTE — Hospital Course (Addendum)
Mr. Zepeda was admitted to the hospital with the working diagnosis of acute on chronic heart failure decompensation.   88 yo male with the past medical history of coronary artery disease, sp CABG 2003, atrial fibrillation, HTN, heart failure, CKD, chronic anemia and GERD who presented with dyspnea. Recent hospitalization at [facility] 01/20 to 01/25 for dysphagia, underwent EGD and esophageal dilatation. Patient reported 2 days of worsening dyspnea, associated with lower extremity edema, and productive cough. On his initial physical examination his blood pressure was 141/60, HR 68, RR 22 and temp 97,5, 02 saturation 100%, his lungs had significant rales bilaterally, heart with S1 and S2 present, rhythmic with no gallops, abdomen soft, positive lower extremity edema.   Na 132, K 4,4 Cl 99, bicarbonate 28, glucose 117 and BUN 20 with cr at 1,0 BNP 489. High sensitive troponin is 11 and 11.  Wbc is 8,9, hgb 8,5 and hct 26,6, plt 282  SARS COVID 19 negative   Urine analysis with SG 1.010, 6-10 wbc   Chest radiograph with cardiomegaly and bilateral hilar vascular congestion.   EKG with 62 bpm, right axis with normal Qtc, atrial fibrillation rhythm, q wave V1 to V3, with poor R wave progression, no significant ST segment or T wave changes.   Patient has been responding well to diuresis, heart failure regimen is being adjusted. Possible dc home in the next 24 hrs with home health services and close follow up by the heart failure team.

## 2021-04-14 NOTE — Assessment & Plan Note (Addendum)
Echocardiogram with LV EF 42% with no regional wall abnormalities, preserved RV systolic function, borderline dilatation of aortic root.  -Known Ischemic cardiomyopathy, EF has fluctuated from 25-45% -improved with diuresis -negative 4L -will hold lasix today with bump in craetinine -pt and family would like to discontinue farxiga with h/o recurrent UTIs -continue carvedilol -further titrate GDMT as BP/kidney function tolerates

## 2021-04-14 NOTE — Assessment & Plan Note (Addendum)
No signs of acute coronary syndrome.  Continue with carvedilol and ezetimibe/ rosuvastatin

## 2021-04-14 NOTE — Progress Notes (Signed)
Heart Failure Nurse Navigator Progress Note  Assessed for HV TOC readiness. Pt has strong family support. Utilizes rolling walker. Plan for [facility] therapy and [place] nurse. Pt resting in recliner, napping. Family at bedside.   No plan for HV TOC clinic upon DC.  Pricilla Holm, MSN, RN Heart Failure Nurse Navigator [phone]

## 2021-04-14 NOTE — TOC Progression Note (Signed)
Transition of Care [facility]) - Progression Note    Patient Details  Name: Gregory [name] MRN: [id number] Date of Birth: 1932-05-21  [facility] CM/SW Contact  Zenon Mayo, RN Phone Number: 04/14/2021, 10:01 AM  Clinical Narrative:    From home , active with [facility] for HHPT, has BSC and rolling walker at home.. CHF, consts on iv lasix, HR down to 30 , last  pm.  TOC will continue to follow for dc needs.         Expected Discharge Plan and Services                                                 Social Determinants of Health (SDOH) Interventions    Readmission Risk Interventions Readmission Risk Prevention Plan 04/08/2021  Transportation Screening Complete  PCP or Specialist Appt within 5-7 Days Complete  Home Care Screening Complete  Medication Review (RN CM) Complete  Some recent data might be hidden

## 2021-04-15 ENCOUNTER — Other Ambulatory Visit (HOSPITAL_COMMUNITY): Payer: Self-pay

## 2021-04-15 ENCOUNTER — Encounter (HOSPITAL_COMMUNITY): Payer: Self-pay | Admitting: Internal Medicine

## 2021-04-15 DIAGNOSIS — E43 Unspecified severe protein-calorie malnutrition: Secondary | ICD-10-CM

## 2021-04-15 LAB — BASIC METABOLIC PANEL
Anion gap: 8 (ref 5–15)
BUN: 22 mg/dL (ref 8–23)
CO2: 33 mmol/L — ABNORMAL HIGH (ref 22–32)
Calcium: 8 mg/dL — ABNORMAL LOW (ref 8.9–10.3)
Chloride: 92 mmol/L — ABNORMAL LOW (ref 98–111)
Creatinine, Ser: 1.24 mg/dL (ref 0.61–1.24)
GFR, Estimated: 56 mL/min — ABNORMAL LOW (ref >60)
Glucose, Bld: 124 mg/dL — ABNORMAL HIGH (ref 70–99)
Potassium: 3.5 mmol/L (ref 3.5–5.1)
Sodium: 133 mmol/L — ABNORMAL LOW (ref 135–145)

## 2021-04-15 MED ORDER — FUROSEMIDE 40 MG PO TABS: 40.0000 mg | ORAL_TABLET | Freq: Every day | ORAL | Status: DC

## 2021-04-15 MED ORDER — ADULT MULTIVITAMIN W/MINERALS CH
1.0000 | ORAL_TABLET | Freq: Every day | ORAL | Status: DC
  Administered 2021-04-15 – 2021-04-17 (×3): 1 via ORAL
  Filled 2021-04-15 (×3): qty 1

## 2021-04-15 MED ORDER — SPIRONOLACTONE 12.5 MG HALF TABLET
12.5000 mg | ORAL_TABLET | Freq: Every day | ORAL | Status: DC
  Administered 2021-04-15: 12.5 mg via ORAL
  Filled 2021-04-15: qty 1

## 2021-04-15 MED ORDER — ENSURE ENLIVE PO LIQD
237.0000 mL | Freq: Three times a day (TID) | ORAL | Status: DC
  Administered 2021-04-15 – 2021-04-17 (×6): 237 mL via ORAL

## 2021-04-15 MED ORDER — EMPAGLIFLOZIN 10 MG PO TABS
10.0000 mg | ORAL_TABLET | Freq: Every day | ORAL | Status: DC
  Administered 2021-04-15 – 2021-04-16 (×2): 10 mg via ORAL
  Filled 2021-04-15 (×2): qty 1

## 2021-04-15 NOTE — Progress Notes (Signed)
Heart Failure Nurse Navigator Progress Note  Visited with eldest daughter at bedside as pt is resting with eyes closed after working with mobility team. Family states pt has all medical equipment at home. Mobilizes well. Lives at home with spouse who still cooks. Both are independent of care. Pt still drives, family attends medical visits with them.   Again offered HV TOC clinic appointment for close follow up per hospitalist request. Explained benefits of [facility] appointment, family agreeable.   PCP appt 2/6.   Scheduled HV TOC clinic appt 2/13 @ 2pm per request. No immediate social needs noted. Plan for continued [facility] therapy and HH RN.   Pricilla Holm, MSN, RN Heart Failure Nurse Navigator [phone]

## 2021-04-15 NOTE — Progress Notes (Signed)
Initial Nutrition Assessment  DOCUMENTATION CODES:   Severe malnutrition in context of chronic illness  INTERVENTION:   Ensure Enlive po TID, each supplement provides 350 kcal and 20 grams of protein Multivitamin w/ minerals daily Discontinue Boost Breeze  Recommend liberalizing pt diet to regular due to malnutrition. Received ok from MD. Encourage good PO intake  NUTRITION DIAGNOSIS:   Severe Malnutrition related to chronic illness (CHF) as evidenced by severe muscle depletion, severe fat depletion.  GOAL:   Patient will meet greater than or equal to 90% of their needs  MONITOR:   PO intake, Supplement acceptance, Weight trends  REASON FOR ASSESSMENT:   Consult Assessment of nutrition requirement/status  ASSESSMENT:    88 y.o. male presented to the ED with SOB and weakness. PMH includes CHF, A. Fib, HTN, CKD IIIa, anemia, GERD, CVA, and esophageal dysphagia. Pt admitted with acute on chronic CHF.   Pt resting in chair and family at bedside at time of visit.   Pt reports that his appetite has been good PTA and currently. Reports that he recently had COVID in December and did lose his sense of taste and smell but recently got that back. Family reports that he did have issues swallowing in the past but last week had that fixed with an EGD. Pt reports that he drinks 3 Boost per day at home.  Per EMR, pt intake includes 100% breakfast on 1/30 and 75% breakfast on 1/31.   Pt reports that he has not noticed any weight loss or weight gain. Pt reports that he does not weight himself recently. Family reports that he was at the doctors about 3 weeks ago and was about 174#. Family and pt reports that he does not typically retain too much fluid. Per EMR, pt has had 8% weight loss within 10 months. Although, unable to determine significant's due to possible fluid loss versus actual dry weight loss.    Family reports that pt fell back in November and bruised his hip pretty bad and has  recently just started to move around more at home.   Discussed ONS with pt/family; pt ok with Ensure due to not having Boost inpatient. Family also said that pt like [employer] as well.   Medications reviewed and include: Lasix, Protonix Labs reviewed:  - Sodium 133   NUTRITION - FOCUSED PHYSICAL EXAM:  Flowsheet Row Most Recent Value  Orbital Region Severe depletion  Upper Arm Region Moderate depletion  Thoracic and Lumbar Region Moderate depletion  Buccal Region Severe depletion  Temple Region Moderate depletion  Clavicle Bone Region Moderate depletion  Clavicle and Acromion Bone Region Moderate depletion  Scapular Bone Region Moderate depletion  Dorsal Hand Severe depletion  Patellar Region Severe depletion  Anterior Thigh Region Severe depletion  Posterior Calf Region Severe depletion  Edema (RD Assessment) None  Hair Reviewed  Eyes Reviewed  Mouth Reviewed  Skin Reviewed  Nails Reviewed       Diet Order:   Diet Order             Diet regular Room service appropriate? Yes; Fluid consistency: Thin  Diet effective now                   EDUCATION NEEDS:   No education needs have been identified at this time  Skin:  Skin Assessment: Skin Integrity Issues: Skin Integrity Issues:: Stage I Stage I: Coccyx  Last BM:  1/30  Height:   Ht Readings from Last 1 Encounters:  04/13/21  5\' 6"  (1.676 m)    Weight:   Wt Readings from Last 1 Encounters:  04/14/21 80.7 kg    Ideal Body Weight:  64.6 kg  BMI:  Body mass index is 28.72 kg/m.  Estimated Nutritional Needs:   Kcal:  2000-2200  Protein:  100-115 grams  Fluid:  >/= 2 L    Sahaana Weitman Louie Casa, RD, LDN Clinical Dietitian See [facility] for contact information.

## 2021-04-15 NOTE — Progress Notes (Signed)
Mobility Specialist Progress Note:   04/15/21 1347  Mobility  Activity Ambulated with assistance in hallway  Level of Assistance Contact guard assist, steadying assist  Assistive Device Front wheel walker  Distance Ambulated (ft) 100 ft  Activity Response Tolerated well  $Mobility charge 1 Mobility   Pt received in chair willing to participate in mobility. No complaints of pain and asymptomatic. Pt left in bed with call bell in reach and all needs met.   [facility] Public librarian Phone [phone] Secondary Phone [phone]

## 2021-04-15 NOTE — Evaluation (Signed)
Occupational Therapy Evaluation Patient Details Name: Gregory [name] MRN: [id number] DOB: 17-May-1932 Today's Date: 04/15/2021   History of Present Illness The pt is an 88 yo male presenting 1/29 with SOB, LE edema, and generalized weakness since d/c on 1/25. Found to have volume overload and elevated BNP. Of note, pt reccently admitted 1/20-1/25 with decreased appetite, upper abdominal pain, and dysphagia. Pt underwent EGD and dilation on 1/23 and was d/c home with 24/7 assist. PMH includes: CAD s/p CABG 2003, afib, HTN, ischemic cardiomyopathy with systolic heart failure, CKD III, chronic anemia, HLD, recurrent esophageal stricture, and failure to thrive.   Clinical Impression   PTA, pt was living with his wife and was performing BADLs with assistance as needed for bathing and dressing and using a RW for mobility; family and wife performing IADLs. Family reports pt was receiving HH therapies prior to admission.  Pt currently requiring Min A for UB ADLs, Min-Mod A for LB ADLs, and Min Guard-Min A for functional mobility with RW. Pt presenting with decreased strength and activity tolerance. Pt would benefit from further acute OT to facilitate safe dc. Recommend dc to home with HHOT for further OT to optimize safety, independence with ADLs, and return to PLOF.      Recommendations for follow up therapy are one component of a multi-disciplinary discharge planning process, led by the attending physician.  Recommendations may be updated based on patient status, additional functional criteria and insurance authorization.   Follow Up Recommendations  Home health OT    Assistance Recommended at Discharge Frequent or constant Supervision/Assistance  Patient can return home with the following A little help with walking and/or transfers;A little help with bathing/dressing/bathroom;Assistance with cooking/housework;Help with stairs or ramp for entrance    Functional Status Assessment  Patient has had a  recent decline in their functional status and demonstrates the ability to make significant improvements in function in a reasonable and predictable amount of time.  Equipment Recommendations  None recommended by OT    Recommendations for Other Services PT consult     Precautions / Restrictions Precautions Precautions: Fall Restrictions Weight Bearing Restrictions: No      Mobility Bed Mobility               General bed mobility comments: in recliner upon arrival    Transfers Overall transfer level: Needs assistance Equipment used: Rolling walker (2 wheels) Transfers: Sit to/from Stand Sit to Stand: Mod assist, Min assist     Step pivot transfers: Min assist     General transfer comment: progressed from [place] initially to [place] with repeated attempts. initially with posterior lean needing support and cues for repositioning to maintain. x6 in session      Balance Overall balance assessment: Needs assistance Sitting-balance support: Feet supported Sitting balance-Leahy Scale: Fair   Postural control: Posterior lean Standing balance support: Reliant on assistive device for balance Standing balance-Leahy Scale: Poor Standing balance comment: minA with posterior lean initially                           ADL either performed or assessed with clinical judgement   ADL Overall ADL's : Needs assistance/impaired Eating/Feeding: Set up;Sitting   Grooming: Sitting;Supervision/safety;Set up   Upper Body Bathing: Minimal assistance;Sitting   Lower Body Bathing: Minimal assistance;Sit to/from stand   Upper Body Dressing : Sitting;Minimal assistance   Lower Body Dressing: Moderate assistance;Sit to/from stand   Toilet Transfer: Ambulation;Rolling walker (2 wheels);Minimal assistance (simulated  to recliner) Armed forces technical officer Details (indicate cue type and reason): MinA for power up and correctin slight posteior lean         Functional mobility during ADLs:  Minimal assistance;Rolling walker (2 wheels);Min guard General ADL Comments: Pt performing functional mobility from window to door x2. Pt very appreciative to move and stretch. Presenting with decreased activity tolerance     Vision         Perception     Praxis      Pertinent Vitals/Pain Pain Assessment Pain Assessment: Faces Faces Pain Scale: Hurts a little bit Pain Location: generalized Pain Descriptors / Indicators: Sore Pain Intervention(s): Monitored during session, Limited activity within patient's tolerance, Repositioned     Hand Dominance Right   Extremity/Trunk Assessment Upper Extremity Assessment Upper Extremity Assessment: Generalized weakness   Lower Extremity Assessment Lower Extremity Assessment: Defer to PT evaluation LLE Deficits / Details: hx of L weakness since CVA per family, significant bilateral edema   Cervical / Trunk Assessment Cervical / Trunk Assessment: Kyphotic   Communication Communication Communication: HOH   Cognition Arousal/Alertness: Awake/alert Behavior During Therapy: WFL for tasks assessed/performed Overall Cognitive Status: Within Functional Limits for tasks assessed                                 General Comments: very motivated. requiring increased time for processing but feel this is baseline. lovs to joke     General Comments  HR 90s. [place], Kieth Brightly, present throughout    Exercises Exercises: Other exercises   Shoulder Instructions      Home Living Family/patient expects to be discharged to:: Private residence Living Arrangements: Spouse/significant other Available Help at Discharge: Family;Available 24 hours/day Type of Home: House Home Access: Stairs to enter [employer] of Steps: 2   Home Layout: One level     Bathroom Shower/Tub: Teacher, early years/pre: Handicapped height     Home Equipment: [place] - single point;Wheelchair - Publishing copy (2 wheels);Shower  seat;BSC/3in1          Prior Functioning/Environment Prior Level of Function : Needs assist       Physical Assist : ADLs (physical);Mobility (physical)   ADLs (physical): Bathing;Dressing;IADLs Mobility Comments: pt has returned to some ambulation with use of RW after deconditioning from covid. states he still need physical assist to stand and supervision for gait ADLs Comments: Wife has been assisting with bathing and LB dressing as needed. Family performing IADLs. Prior to fall and then COVID, pt was independent and enjoyed working in the yard        OT Problem List: Decreased activity tolerance;Impaired balance (sitting and/or standing);Decreased safety awareness;Decreased knowledge of precautions;Decreased knowledge of use of DME or AE;Decreased strength      OT Treatment/Interventions: Self-care/ADL training;Neuromuscular education;Energy conservation;DME and/or AE instruction;Therapeutic activities;Balance training;Patient/family education    OT Goals(Current goals can be found in the care plan section) Acute Rehab OT Goals Patient Stated Goal: Get stronger OT Goal Formulation: With patient Time For Goal Achievement: 04/29/21 Potential to Achieve Goals: Good ADL Goals Pt Will Perform Grooming: with supervision;standing Pt Will Perform Lower Body Dressing: with supervision;sit to/from stand;sitting/lateral leans Pt Will Transfer to Toilet: with supervision;ambulating;regular height toilet Pt Will Perform Toileting - Clothing Manipulation and hygiene: with supervision;sitting/lateral leans;sit to/from stand Pt Will Perform Tub/Shower Transfer: with min guard assist;shower seat;ambulating;Tub transfer;rolling walker  OT Frequency: Min 2X/week    Co-evaluation  AM-PAC OT "6 Clicks" Daily Activity     Outcome Measure Help from another person eating meals?: A Little Help from another person taking care of personal grooming?: A Little Help from another  person toileting, which includes using toliet, bedpan, or urinal?: A Lot Help from another person bathing (including washing, rinsing, drying)?: A Little Help from another person to put on and taking off regular upper body clothing?: A Little Help from another person to put on and taking off regular lower body clothing?: A Little 6 Click Score: 17   End of Session Equipment Utilized During Treatment: Gait belt;Rolling walker (2 wheels) Nurse Communication: Mobility status  Activity Tolerance: Patient tolerated treatment well Patient left: in chair;with call bell/phone within reach;with chair alarm set;with family/visitor present;with nursing/sitter in room  OT Visit Diagnosis: Unsteadiness on feet (R26.81);Muscle weakness (generalized) (M62.81)                Time: 9470-9628 OT Time Calculation (min): 17 min Charges:  OT General Charges $OT Visit: 1 Visit OT Evaluation $OT Eval Moderate Complexity: [place], OTR/L Acute Rehab Pager: [phone] Office: [place] 04/15/2021, 11:11 AM

## 2021-04-15 NOTE — Progress Notes (Signed)
CCMD notified RN that patient had a 10 beat run of [place] at 1513. RN rounded on patient and patient says he has no chest pain and "he feels fine". Patient has been lying in bed since 1400. MD paged to notify.

## 2021-04-15 NOTE — Plan of Care (Signed)
°  Problem: Education: Goal: Ability to demonstrate management of disease process will improve Outcome: Progressing Goal: Individualized Educational Video(s) Outcome: Progressing   Problem: Activity: Goal: Capacity to carry out activities will improve Outcome: Progressing   Problem: Cardiac: Goal: Ability to achieve and maintain adequate cardiopulmonary perfusion will improve Outcome: Progressing

## 2021-04-15 NOTE — Assessment & Plan Note (Signed)
Continue with nutritional supplements, will liberate diet to regular.

## 2021-04-15 NOTE — Progress Notes (Addendum)
Progress Note   Patient: Gregory [name] LTJ:[id number] DOB: 10/06/1932 DOA: 04/13/2021     2 DOS: the patient was seen and examined on 04/15/2021   Brief hospital course: Gregory [name] was admitted to the hospital with the working diagnosis of acute on chronic heart failure decompensation.   88 yo male with the past medical history of coronary artery disease, sp CABG 2003, atrial fibrillation, HTN, heart failure, CKD, chronic anemia and GERD who presented with dyspnea. Recent hospitalization at [facility] 01/20 to 01/25 for dysphagia, underwent EGD and esophageal dilatation. Patient reported 2 days of worsening dyspnea, associated with lower extremity edema, and productive cough. On his initial physical examination his blood pressure was 141/60, HR 68, RR 22 and temp 97,5, 02 saturation 100%, his lungs had significant rales bilaterally, heart with S1 and S2 present, rhythmic with no gallops, abdomen soft, positive lower extremity edema.   Na 132, K 4,4 Cl 99, bicarbonate 28, glucose 117 and BUN 20 with cr at 1,0 BNP 489. High sensitive troponin is 11 and 11.  Wbc is 8,9, hgb 8,5 and hct 26,6, plt 282  SARS COVID 19 negative   Urine analysis with SG 1.010, 6-10 wbc   Chest radiograph with cardiomegaly and bilateral hilar vascular congestion.   EKG with 62 bpm, right axis with normal Qtc, atrial fibrillation rhythm, q wave V1 to V3, with poor R wave progression, no significant ST segment or T wave changes.   Patient has been responding well to diuresis, heart failure regimen is being adjusted. Possible dc home in the next 24 hrs with home health services and close follow up by the heart failure team.   Assessment and Plan Assessment and Plan: * Acute on chronic systolic CHF (congestive heart failure) ([place])- (present on admission) Echocardiogram with LV EF 42% with no regional wall abnormalities, preserved RV systolic function, borderline dilatation of aortic root.   Patient has been responding well  to diuresis with IV furosemide, with improvement in his symptoms.  Urine output over last 24 hrs is 0,762 Systolic blood pressure 99 to 120 mmHg.   Will transition to oral furosemide in am 40 mg Continue with carvedilol and will add empagliflozin and low dose spironolactone. Hold on ARB, ACE inhibition or ARNI due to risk of hypotension, patient will follow up with the heart failure team as outpatient.     CAD (coronary artery disease),CABG 1993-LIMA to the LAD, SVG to Om and SVG to PDA/PLA, patent on cath 2014.- (present on admission) No signs of acute coronary syndrome.  Continue with carvedilol and ezetimibe/ rosuvastatin    Atrial fibrillation, chronic (HCC)- (present on admission) His heart rate has been controlled with carvedilol, continue anticoagulation with apixaban.,    Chronic kidney disease, stage 3a ([place])- (present on admission) Hyponatremia  Stable renal function with serum cr at 1,2 with K at 3,5, bicarbonate is 33. NA 133   Plan to continue diuresis with oral furosemide, will add spironolactone.    GERD without esophagitis- (present on admission) On pantoprazole    Essential hypertension- (present on admission) Patient is tolerating well diuresis with furosemide.  Plan to continue carvedilol.     Mixed hyperlipidemia- (present on admission) Continue with ezetimibe and rosuvastatin    Hypothyroidism- (present on admission) Continue with levothyroxine     Pressure injury of sacral region, stage 1- (present on admission) Continue care to stage 1 pressure ulcer. PT and OT , out of bed to chair tid with meals   Protein-calorie malnutrition, severe  Continue with nutritional supplements, will liberate diet to regular.   Hypomagnesemia-resolved as of 04/14/2021, (present on admission) Replacing with intravenous magnesium sulfate Monitoring magnesium levels with serial chemistries.        Subjective: Patient with no nausea or vomiting, no chest  pain, dyspnea and lower extremity edema continue to improve, he is out of bed to chair and his daughter is at the bedside   Objective BP (!) 128/37 (BP Location: Left Arm)    Pulse 70    Temp 97.9 F (36.6 C) (Oral)    Resp 20    Ht 5\' 6"  (1.676 m)    Wt 80.7 kg    SpO2 97%    BMI 28.72 kg/m   Neurology  awake and alert ENT no pallor  Cardiovascular heart with S1 and S2 present and rhythmic, with no gallops, rubs or murmurs.  No JVD Positive lower extremity edema at the ankles, pitting +/++ Pulmonary with no wheezing, or rhonchi, mild rales at bases.   Abdominal soft and non tender Skin Musculoskeletal   Data Reviewed:    Family Communication: I spoke with patient's daughter at the bedside, we talked in detail about patient's condition, plan of care and prognosis and all questions were addressed.   Disposition: Status is: Inpatient Remains inpatient appropriate because: heart failure management with IV furosemide   Planned Discharge Destination: Home with Home Health             Author: Tawni Millers, MD 04/15/2021 1:44 PM  For on call review [email or web address].

## 2021-04-15 NOTE — TOC Benefit Eligibility Note (Signed)
Patient Teacher, English as a foreign language completed.    The patient is currently admitted and upon discharge could be taking Jardiance 10 mg.  The current 30 day co-pay is, $47.00.   The patient is insured through [place], [place] Patient Advocate Specialist [place] Patient Advocate Team Direct Number: [phone]  Fax: [phone]

## 2021-04-15 NOTE — Progress Notes (Signed)
°   04/15/21 2014  Vitals  Temp (!) 101.9 F (38.8 C)  Temp Source Oral  BP (!) 117/48  MAP (mmHg) 67  BP Location Left Arm  BP Method Automatic  Patient Position (if appropriate) Lying  Pulse Rate 74  Pulse Rate Source Monitor  ECG Heart Rate 79  Resp 18  MEWS COLOR  MEWS Score Color Yellow  Oxygen Therapy  SpO2 96 %  O2 Device Room Air  Pain Assessment  Pain Scale 0-10  Pain Score 0  MEWS Score  MEWS Temp 2  MEWS Systolic 0  MEWS Pulse 0  MEWS RR 0  MEWS LOC 0  MEWS Score 2   RN reassessed VS per yellow MEWS protocol. Patient has a temp of 101.9 oral. RN will give prn tylenol and will reassess oral temp in 1 hour. RN notified primary RN.

## 2021-04-16 ENCOUNTER — Inpatient Hospital Stay (HOSPITAL_COMMUNITY): Payer: Medicare Other

## 2021-04-16 LAB — BASIC METABOLIC PANEL
Anion gap: 8 (ref 5–15)
BUN: 28 mg/dL — ABNORMAL HIGH (ref 8–23)
CO2: 34 mmol/L — ABNORMAL HIGH (ref 22–32)
Calcium: 8 mg/dL — ABNORMAL LOW (ref 8.9–10.3)
Chloride: 91 mmol/L — ABNORMAL LOW (ref 98–111)
Creatinine, Ser: 1.57 mg/dL — ABNORMAL HIGH (ref 0.61–1.24)
GFR, Estimated: 42 mL/min — ABNORMAL LOW (ref >60)
Glucose, Bld: 135 mg/dL — ABNORMAL HIGH (ref 70–99)
Potassium: 3.6 mmol/L (ref 3.5–5.1)
Sodium: 133 mmol/L — ABNORMAL LOW (ref 135–145)

## 2021-04-16 LAB — URINALYSIS, MICROSCOPIC (REFLEX)

## 2021-04-16 LAB — CBC
HCT: 26.1 % — ABNORMAL LOW (ref 39.0–52.0)
Hemoglobin: 8.2 g/dL — ABNORMAL LOW (ref 13.0–17.0)
MCH: 29.8 pg (ref 26.0–34.0)
MCHC: 31.4 g/dL (ref 30.0–36.0)
MCV: 94.9 fL (ref 80.0–100.0)
Platelets: 283 10*3/uL (ref 150–400)
RBC: 2.75 MIL/uL — ABNORMAL LOW (ref 4.22–5.81)
RDW: 14.8 % (ref 11.5–15.5)
WBC: 8.5 10*3/uL (ref 4.0–10.5)
nRBC: 0 % (ref 0.0–0.2)

## 2021-04-16 LAB — URINALYSIS, ROUTINE W REFLEX MICROSCOPIC
Bilirubin Urine: NEGATIVE
Glucose, UA: >=500 mg/dL — AB
Hgb urine dipstick: NEGATIVE
Ketones, ur: NEGATIVE mg/dL
Leukocytes,Ua: NEGATIVE
Nitrite: NEGATIVE
Protein, ur: 30 mg/dL — AB
Specific Gravity, Urine: 1.015 (ref 1.005–1.030)
pH: 8.5 — ABNORMAL HIGH (ref 5.0–8.0)

## 2021-04-16 LAB — PROCALCITONIN: Procalcitonin: 0.33 ng/mL

## 2021-04-16 LAB — C-REACTIVE PROTEIN: CRP: 11.9 mg/dL — ABNORMAL HIGH (ref <1.0)

## 2021-04-16 LAB — MAGNESIUM: Magnesium: 1.6 mg/dL — ABNORMAL LOW (ref 1.7–2.4)

## 2021-04-16 MED ORDER — SODIUM CHLORIDE 0.9 % IV SOLN: INTRAVENOUS | Status: DC

## 2021-04-16 MED ORDER — SPIRONOLACTONE 12.5 MG HALF TABLET: 12.5000 mg | ORAL_TABLET | Freq: Every day | ORAL | Status: DC

## 2021-04-16 NOTE — Progress Notes (Signed)
Mobility Specialist Progress Note: ° ° 04/16/21 1200  °Mobility  °Bed Position Chair  °Activity Ambulated with assistance in hallway  °Level of Assistance Contact guard assist, steadying assist  °Assistive Device Front wheel walker  °Distance Ambulated (ft) 100 ft  °Activity Response Tolerated well  °$Mobility charge 1 Mobility  ° °Pt received in bed willing to participate in mobility. No complaints of pain and asymptomatic. Pt left in chair with call bell in reach and all needs met.  ° °[place] Day °Mobility Specialist °Primary Phone [phone] °Secondary Phone [phone] ° °

## 2021-04-16 NOTE — Progress Notes (Signed)
Physical Therapy Treatment Patient Details Name: Gregory [name] MRN: [id number] DOB: May 03, 1932 Today's Date: 04/16/2021   History of Present Illness 88 yo male presenting 1/29 with SOB, LE edema, and generalized weakness since d/c on 1/25. Found to have volume overload and elevated BNP. Of note, pt reccently admitted 1/20-1/25 with decreased appetite, upper abdominal pain, and dysphagia. Pt underwent EGD and dilation on 1/23 and was d/c home with 24/7 assist. PMH includes: CAD s/p CABG 2003, afib, HTN, ischemic cardiomyopathy with systolic heart failure, CKD III, chronic anemia, HLD, recurrent esophageal stricture, and failure to thrive.    PT Comments    Pt was seen for therapy session with help to get up to walk and maneuver in confined spaces.  He is weaker on L side, and requires help to cue him for sufficient set up to sit safely.  Daughter is a caregiver and has been in person for session to see how pt requires to be assisted.  Followed up with standing and sitting resisted exercises which daughter reports are new to his routine of ex.  Follow along with him for acute PT and work on standing balance, reciprocal sequence of gait and endurance of gait as tolerated.  Pt is motivated to get home and fairly strong despite his recent setbacks.   Recommendations for follow up therapy are one component of a multi-disciplinary discharge planning process, led by the attending physician.  Recommendations may be updated based on patient status, additional functional criteria and insurance authorization.  Follow Up Recommendations  Home health PT     Assistance Recommended at Discharge Frequent or constant Supervision/Assistance  Patient can return home with the following A little help with walking and/or transfers;A little help with bathing/dressing/bathroom;Help with stairs or ramp for entrance   Equipment Recommendations  None recommended by PT    Recommendations for Other Services        Precautions / Restrictions Precautions Precautions: Fall Precaution Comments: monitor O2 + HR Restrictions Weight Bearing Restrictions: No     Mobility  Bed Mobility Overal bed mobility: Needs Assistance             General bed mobility comments: up in chair    Transfers Overall transfer level: Needs assistance Equipment used: Rolling walker (2 wheels) Transfers: Sit to/from Stand Sit to Stand: Mod assist           General transfer comment: mod to stand and min to control sitting    Ambulation/Gait Ambulation/Gait assistance: Min assist Gait Distance (Feet): 40 Feet Assistive device: Rolling walker (2 wheels) Gait Pattern/deviations: Decreased stride length, Decreased step length - left, Decreased step length - right, Decreased stance time - left Gait velocity: decreased Gait velocity interpretation: <1.8 ft/sec, indicate of risk for recurrent falls Pre-gait activities: standing postural control General Gait Details: pt had poor control of steps with shuffling but noted shorter RLE step and lateral instability on gait   Stairs             Wheelchair Mobility    Modified Rankin (Stroke Patients Only)       Balance Overall balance assessment: Needs assistance Sitting-balance support: Feet supported Sitting balance-Leahy Scale: Fair     Standing balance support: Bilateral upper extremity supported, During functional activity Standing balance-Leahy Scale: Poor Standing balance comment: flexed standing posture with poor control on LLE to turn and sidestep  Cognition Arousal/Alertness: Awake/alert Behavior During Therapy: WFL for tasks assessed/performed Overall Cognitive Status: Within Functional Limits for tasks assessed                                 General Comments: reasonable hearing and interacting in conversation        Exercises General Exercises - Lower Extremity Ankle  Circles/Pumps: AROM, 5 reps Long Arc Quad: Strengthening, 10 reps Heel Slides: Strengthening, 10 reps Hip ABduction/ADduction: Strengthening, 10 reps Hip Flexion/Marching: AROM, 10 reps Toe Raises: Strengthening, 10 reps    General Comments General comments (skin integrity, edema, etc.): pt is demonstrating poor balance control and poor stabiltiy in standing, but is expecting to go home and so worked on standing balance challenges      Pertinent Vitals/Pain Pain Assessment Pain Assessment: No/denies pain    Home Living                          Prior Function            PT Goals (current goals can now be found in the care plan section) Acute Rehab PT Goals Patient Stated Goal: return to walking in the home Progress towards PT goals: Progressing toward goals    Frequency    Min 2X/week      PT Plan Current plan remains appropriate    Co-evaluation              AM-PAC PT "6 Clicks" Mobility   Outcome Measure  Help needed turning from your back to your side while in a flat bed without using bedrails?: A Little Help needed moving from lying on your back to sitting on the side of a flat bed without using bedrails?: A Little Help needed moving to and from a bed to a chair (including a wheelchair)?: A Little Help needed standing up from a chair using your arms (e.g., wheelchair or bedside chair)?: A Little Help needed to walk in hospital room?: A Little Help needed climbing 3-5 steps with a railing? : A Lot 6 Click Score: 17    End of Session Equipment Utilized During Treatment: Gait belt Activity Tolerance: Patient tolerated treatment well;Patient limited by fatigue Patient left: in chair;with call bell/phone within reach;with chair alarm set;with family/visitor present Nurse Communication: Mobility status PT Visit Diagnosis: Difficulty in walking, not elsewhere classified (R26.2);Unsteadiness on feet (R26.81);Muscle weakness (generalized)  (M62.81);Ataxic gait (R26.0);Hemiplegia and hemiparesis Hemiplegia - Right/Left: Left Hemiplegia - dominant/non-dominant: Non-dominant Hemiplegia - caused by: Unspecified     Time: 6213-0865 PT Time Calculation (min) (ACUTE ONLY): 29 min  Charges:  $Gait Training: 8-22 mins $Therapeutic Exercise: 8-22 mins        Ramond Dial 04/16/2021, 3:30 PM  Mee Hives, PT PhD Acute Rehab Dept. Number: [place]

## 2021-04-16 NOTE — Plan of Care (Signed)
  Problem: Education: Goal: Ability to demonstrate management of disease process will improve Outcome: Progressing   Problem: Education: Goal: Ability to verbalize understanding of medication therapies will improve Outcome: Progressing   Problem: Clinical Measurements: Goal: Ability to maintain clinical measurements within normal limits will improve Outcome: Progressing   

## 2021-04-16 NOTE — Plan of Care (Signed)
°  Problem: Education: Goal: Ability to demonstrate management of disease process will improve Outcome: Progressing   Problem: Cardiac: Goal: Ability to achieve and maintain adequate cardiopulmonary perfusion will improve Outcome: Progressing   Problem: Education: Goal: Ability to demonstrate management of disease process will improve Outcome: Progressing

## 2021-04-17 DIAGNOSIS — R509 Fever, unspecified: Secondary | ICD-10-CM | POA: Diagnosis not present

## 2021-04-17 LAB — CBC
HCT: 24.9 % — ABNORMAL LOW (ref 39.0–52.0)
Hemoglobin: 8.2 g/dL — ABNORMAL LOW (ref 13.0–17.0)
MCH: 30.9 pg (ref 26.0–34.0)
MCHC: 32.9 g/dL (ref 30.0–36.0)
MCV: 94 fL (ref 80.0–100.0)
Platelets: 278 10*3/uL (ref 150–400)
RBC: 2.65 MIL/uL — ABNORMAL LOW (ref 4.22–5.81)
RDW: 14.9 % (ref 11.5–15.5)
WBC: 10.8 10*3/uL — ABNORMAL HIGH (ref 4.0–10.5)
nRBC: 0 % (ref 0.0–0.2)

## 2021-04-17 LAB — BASIC METABOLIC PANEL
Anion gap: 8 (ref 5–15)
BUN: 28 mg/dL — ABNORMAL HIGH (ref 8–23)
CO2: 30 mmol/L (ref 22–32)
Calcium: 8 mg/dL — ABNORMAL LOW (ref 8.9–10.3)
Chloride: 94 mmol/L — ABNORMAL LOW (ref 98–111)
Creatinine, Ser: 1.42 mg/dL — ABNORMAL HIGH (ref 0.61–1.24)
GFR, Estimated: 48 mL/min — ABNORMAL LOW (ref >60)
Glucose, Bld: 110 mg/dL — ABNORMAL HIGH (ref 70–99)
Potassium: 3.2 mmol/L — ABNORMAL LOW (ref 3.5–5.1)
Sodium: 132 mmol/L — ABNORMAL LOW (ref 135–145)

## 2021-04-17 LAB — PROCALCITONIN: Procalcitonin: 0.41 ng/mL

## 2021-04-17 MED ORDER — FUROSEMIDE 40 MG PO TABS: 40.0000 mg | ORAL_TABLET | Freq: Every day | ORAL | 0 refills | Status: DC

## 2021-04-17 MED ORDER — MAGNESIUM SULFATE 2 GM/50ML IV SOLN
2.0000 g | Freq: Once | INTRAVENOUS | Status: AC
  Administered 2021-04-17: 2 g via INTRAVENOUS
  Filled 2021-04-17: qty 50

## 2021-04-17 MED ORDER — POTASSIUM CHLORIDE CRYS ER 20 MEQ PO TBCR
40.0000 meq | EXTENDED_RELEASE_TABLET | Freq: Once | ORAL | Status: AC
  Administered 2021-04-17: 40 meq via ORAL
  Filled 2021-04-17: qty 2

## 2021-04-17 MED ORDER — POTASSIUM CHLORIDE CRYS ER 20 MEQ PO TBCR: 20.0000 meq | EXTENDED_RELEASE_TABLET | Freq: Every day | ORAL | 0 refills | Status: DC

## 2021-04-17 MED ORDER — CEFDINIR 300 MG PO CAPS
300.0000 mg | ORAL_CAPSULE | Freq: Two times a day (BID) | ORAL | 0 refills | Status: AC
Start: 2021-04-17 — End: 2021-04-22

## 2021-04-17 NOTE — Discharge Summary (Signed)
Physician Discharge Summary  Gregory [name] ION:[id number] DOB: [birth year] DOA: 04/13/2021  PCP: Darreld Mclean, MD  Admit date: 04/13/2021 Discharge date: 04/17/2021  Time spent: 35 minutes  Recommendations for Outpatient Follow-up:  PCP in 1 week Please check BMP at [place], RN   Discharge Diagnoses:  Principal Problem:   Acute on chronic systolic CHF (congestive heart failure) ([place]) Active Problems:   Fever   CAD (coronary artery disease),CABG 1993-LIMA to the LAD, SVG to Om and SVG to PDA/PLA, patent on cath 2014.   Atrial fibrillation, chronic (HCC)   Chronic kidney disease, stage 3a ([place])   GERD without esophagitis   Essential hypertension   Mixed hyperlipidemia   Hypothyroidism   Pressure injury of sacral region, stage 1   Protein-calorie malnutrition, severe   Discharge Condition: Stable  Diet recommendation: Low-sodium, heart healthy  Filed Weights   04/14/21 0200 04/16/21 0537 04/17/21 0342  Weight: 80.7 kg 79.5 kg 75.5 kg    History of present illness:  88 yo male with the past medical history of coronary artery disease, sp CABG 2003, atrial fibrillation, HTN, heart failure, CKD3a, chronic anemia and GERD who presented with dyspnea. Recent hospitalization at [facility] 01/20 to 01/25 for dysphagia, underwent EGD and esophageal dilatation. Patient reported 2 days of worsening dyspnea, associated with lower extremity edema, and productive cough. Chest radiograph with cardiomegaly and bilateral hilar vascular congestion.   Hospital Course:   Acute on chronic systolic CHF (congestive heart failure) ([place])- (present on admission) Echocardiogram with LV EF 42% with no regional wall abnormalities, preserved RV systolic function, borderline dilatation of aortic root.  -Known Ischemic cardiomyopathy, EF has fluctuated from 25-45% -improved with diuresis -negative 4.8L -Lasix 40 Mg daily resumed at discharge -pt and family would like to discontinue farxiga with  h/o recurrent UTIs -Continued on carvedilol, consider further GDMT as outpatient if blood pressure tolerates   Fever -Developed low-grade fevers 2 days ago, recent COVID was negative, suspect mild bronchitis, chest x-ray and UA were unremarkable, fever curve improved, started on cefdinir for 5 days    CAD (coronary artery disease),CABG 1993-LIMA to the LAD, SVG to Om and SVG to PDA/PLA, patent on cath 2014.- (present on admission) No signs of acute coronary syndrome.  Continue with carvedilol and ezetimibe/ rosuvastatin   Atrial fibrillation, chronic (HCC)- (present on admission) -HR controlled with carvedilol, continue anticoagulation with apixaban.,    Chronic kidney disease, stage 3a ([place])- (present on admission) Hyponatremia  -creatinine trended upto to 1.6, lasix/aldactone held -Restarted Lasix at discharge, creatinine stabilized at 1.4  Protein-calorie malnutrition, severe Continue with nutritional supplements, will liberate diet to regular.    Pressure injury of sacral region, stage 1- (present on admission) Continue care to stage 1 pressure ulcer.   Hypothyroidism- (present on admission) Continue with levothyroxine     Mixed hyperlipidemia- (present on admission) Continue with ezetimibe and rosuvastatin    Essential hypertension- (present on admission) - continue carvedilol.     GERD without esophagitis- (present on admission) On pantoprazole      Hypomagnesemia -Replaced          Discharge Exam: Vitals:   04/17/21 0341 04/17/21 0628  BP: (!) 90/51 (!) 106/46  Pulse: 76 73  Resp: 18   Temp: (!) 97.4 F (36.3 C)   SpO2: 97%     Gen: AAOx3, no distress Cardiovascular: CVS: S1-S2, regular rate rhythm Respiratory: Clear  Discharge Instructions   Discharge Instructions     Diet - low sodium  heart healthy   Complete by: As directed    Increase activity slowly   Complete by: As directed    No wound care   Complete by: As directed        Allergies as of 04/17/2021       Reactions   Percocet [oxycodone-acetaminophen] Itching, Nausea Only        Medication List     STOP taking these medications    prochlorperazine 10 MG tablet Commonly known as: COMPAZINE   rOPINIRole 0.25 MG tablet Commonly known as: REQUIP       TAKE these medications    acetaminophen 500 MG tablet Commonly known as: TYLENOL Take 500 mg by mouth every 6 (six) hours as needed for moderate pain.   carvedilol 3.125 MG tablet Commonly known as: COREG Take 1 tablet (3.125 mg total) by mouth 2 (two) times daily with a meal. TAKE 1 TABLET(3.125 MG) BY MOUTH TWICE DAILY WITH A MEAL What changed: additional instructions   cefdinir 300 MG capsule Commonly known as: OMNICEF Take 1 capsule (300 mg total) by mouth 2 (two) times daily for 5 days.   Eliquis 2.5 MG Tabs tablet Generic drug: apixaban TAKE 1 TABLET(2.5 MG) BY MOUTH TWICE DAILY What changed: See the new instructions.   ezetimibe 10 MG tablet Commonly known as: ZETIA TAKE 1 TABLET(10 MG) BY MOUTH DAILY What changed: See the new instructions.   feeding supplement (BOOST BREEZE) Liqd Take 1 each by mouth 3 (three) times daily between meals.   furosemide 40 MG tablet Commonly known as: Lasix Take 1 tablet (40 mg total) by mouth daily.   levothyroxine 25 MCG tablet Commonly known as: SYNTHROID TAKE 1 TABLET(25 MCG) BY MOUTH DAILY BEFORE BREAKFAST What changed: See the new instructions.   melatonin 3 MG Tabs tablet Take 1 tablet (3 mg total) by mouth at bedtime.   pantoprazole 40 MG tablet Commonly known as: PROTONIX TAKE 1 TABLET(40 MG) BY MOUTH TWICE DAILY What changed: See the new instructions.   potassium chloride SA 20 MEQ tablet Commonly known as: KLOR-CON M Take 1 tablet (20 mEq total) by mouth daily.   rosuvastatin 40 MG tablet Commonly known as: CRESTOR TAKE 1 TABLET(40 MG) BY MOUTH DAILY What changed: See the new instructions.   tiZANidine 4 MG  tablet Commonly known as: ZANAFLEX TAKE 1 TABLET(4 MG) BY MOUTH EVERY 8 HOURS AS NEEDED FOR MUSCLE SPASMS. DO NOT COMBINE WITH TRAMADOL What changed: See the new instructions.   traMADol 50 MG tablet Commonly known as: ULTRAM Take 1 tablet (50 mg total) by mouth every 6 (six) hours as needed. What changed: reasons to take this       Allergies  Allergen Reactions   Percocet [Oxycodone-Acetaminophen] Itching and Nausea Only    Follow-up Information     [facility] Follow up.   Specialty: Home Health Services Why: [place], [place] will contact you with apt times Contact information: [address] [address] [phone]         [name], [name], MD. Go on 04/21/2021.   Specialty: Family Medicine Why: @1 :00pm Contact information: [place] [address] [phone]         [place] Sine, MD .   Specialty: Cardiology Contact information: [address] [place] [address] [phone]                  The results of significant diagnostics from this hospitalization (including  imaging, microbiology, ancillary and laboratory) are listed below for reference.    Significant Diagnostic Studies: DG Chest 2 View  Result Date: 04/08/2021 CLINICAL DATA:  Dysphagia. EXAM: CHEST - 2 VIEW COMPARISON:  April 04, 2021. FINDINGS: Stable cardiomegaly. Status post coronary bypass graft. Increased bibasilar atelectasis or edema is noted. Bony thorax is unremarkable. IMPRESSION: Increased bibasilar atelectasis or edema is noted. Electronically Signed   By: Marijo Conception M.D.   On: 04/08/2021 16:20   DG Chest 2 View  Result Date: 03/20/2021 CLINICAL DATA:  Recent COVID infection EXAM: CHEST - 2 VIEW COMPARISON:  Chest x-ray dated September 14, 2017 FINDINGS: Stable cardiomegaly. Status post median sternotomy and CABG. Mild scattered linear opacities. No large pleural effusion or pneumothorax. IMPRESSION:  Mild scattered linear opacities, possibly due to atelectasis or sequela of recent COVID infection. No focal consolidation. Electronically Signed   By: Yetta Glassman M.D.   On: 03/20/2021 14:27   DG Lumbar Spine Complete  Result Date: 03/25/2021 CLINICAL DATA:  Back pain. EXAM: LUMBAR SPINE - COMPLETE 4+ VIEW COMPARISON:  CT enterography 05/19/2016. FINDINGS: Spinal alignment is within normal limits. There is no evidence for acute fracture. There is mild chronic anterior wedge deformity of T12 and T11 which appears unchanged from prior. There is moderate disc space narrowing at L5-S1 and mild disc space narrowing at L4-L5. Endplate osteophytes are seen throughout the lumbar spine similar to prior examination. There are atherosclerotic calcifications of the aorta. IMPRESSION: 1. No acute bony abnormality. 2. Stable mild/moderate degenerative changes. Electronically Signed   By: Ronney Asters M.D.   On: 03/25/2021 17:23   MR LUMBAR SPINE WO CONTRAST  Result Date: 03/25/2021 CLINICAL DATA:  Low back pain, cauda equina syndrome suspected. Bilateral leg pain. EXAM: MRI LUMBAR SPINE WITHOUT CONTRAST TECHNIQUE: Multiplanar, multisequence MR imaging of the lumbar spine was performed. No intravenous contrast was administered. COMPARISON:  Lumbar spine MRI 06/12/2015 FINDINGS: Segmentation:  Standard. Alignment:  Chronic trace retrolisthesis of L2 on L3 and L3 on L4. Vertebrae: No acute fracture, suspicious marrow lesion, or evidence of discitis. Unchanged mild chronic anterior wedging of the T12 vertebral body. Scattered small Schmorl's nodes and multilevel degenerative endplate changes. Conus medullaris and cauda equina: Conus extends to the L1 level. Conus and cauda equina appear normal. Paraspinal and other soft tissues: Asymmetric left renal cortical thinning and scarring with slightly prominent left extrarenal pelvis. Disc levels: Disc desiccation throughout the lumbar spine. Moderate disc space narrowing at  L4-5 and L5-S1. T12-L1: Negative. L1-2: Minimal disc bulging without stenosis, unchanged. L2-3: Retrolisthesis with bulging uncovered disc and mild-to-moderate right and mild left facet hypertrophy result in mild bilateral lateral recess stenosis and borderline bilateral neural foraminal stenosis, mildly progressed. No spinal stenosis. L3-4: Circumferential disc bulging and mild-to-moderate facet and ligamentum flavum hypertrophy result in moderate bilateral lateral recess stenosis and mild bilateral neural foraminal stenosis without significant spinal stenosis, unchanged. The L4 nerve roots may be affected in the lateral recesses. L4-5: Circumferential disc bulging and mild-to-moderate facet and ligamentum flavum hypertrophy result in mild-to-moderate bilateral lateral recess stenosis and mild right and mild-to-moderate bilateral neural foraminal stenosis without significant spinal stenosis, unchanged. L5-S1: Disc bulging and moderate facet and ligamentum flavum hypertrophy result in mild-to-moderate bilateral lateral recess stenosis and moderate right and severe left neural foraminal stenosis without significant generalized spinal stenosis, not significantly changed. IMPRESSION: 1. Mild progression of mild bilateral lateral recess stenosis at L2-3. 2. Unchanged disc and facet degeneration elsewhere with mild-to-moderate lateral recess and neural  foraminal stenosis as above. Electronically Signed   By: Logan Bores M.D.   On: 03/25/2021 21:13   US Abdomen Complete  Result Date: 04/05/2021 CLINICAL DATA:  Elevated LFTs. EXAM: ABDOMEN ULTRASOUND COMPLETE COMPARISON:  CT chest, abdomen and pelvis 03/20/2021 FINDINGS: Gallbladder: The gallbladder wall appears mildly thickened measuring 4 mm. No gallstones, pericholecystic fluid or sonographic Murphy's sign. Common bile duct: Diameter: 4 mm Liver: The liver has a coarsened echotexture. Contour of the liver appears irregular and nodular. Portal vein is patent on  color Doppler imaging with normal direction of blood flow towards the liver. IVC: Diminished exam detail.  No abnormality visualized. Pancreas: Diminished exam detail.  Visualized portion unremarkable. Spleen: Size and appearance within normal limits. Right Kidney: Length: 10 cm. Within the interpolar portions of the right kidney there is a cyst measuring 2.1 x 2.1 x 2.0 cm. Echogenicity within normal limits. No mass or hydronephrosis visualized. Left Kidney: Length: 9.4 cm. Echogenicity within normal limits. No mass or hydronephrosis visualized. Abdominal aorta: No aneurysm visualized. Other findings: Markedly diminished exam detail secondary to patient body habitus and overlying bowel gas. IMPRESSION: 1. Markedly diminished exam detail secondary to patient body habitus and overlying bowel gas. 2. Morphologic features of the liver suggestive of cirrhosis. 3. Gallbladder wall thickening. No gallstones, pericholecystic fluid or sonographic Murphy's sign noted. Electronically Signed   By: Kerby Moors M.D.   On: 04/05/2021 06:49   DG CHEST PORT 1 VIEW  Result Date: 04/16/2021 CLINICAL DATA:  Fever EXAM: PORTABLE CHEST 1 VIEW COMPARISON:  04/13/2021 FINDINGS: Low lung volumes are present, causing crowding of the pulmonary vasculature. Stable mild enlargement of the cardiopericardial silhouette. Prior CABG. Atherosclerotic calcification of the aortic arch. Mildly improved aeration at the left lung base. Accentuated density along the left diaphragm may reflect continued left lower lobe airspace opacity or pleural effusion. IMPRESSION: 1. Mildly improved aeration at the left lung base although there is still retro diaphragmatic opacity which could be from airspace opacity or pleural effusion. 2. Stable mild enlargement of the cardiopericardial silhouette. 3.  Aortic Atherosclerosis (ICD10-I70.0). Electronically Signed   By: Van Clines M.D.   On: 04/16/2021 09:01   DG Chest Port 1 View  Result Date:  04/13/2021 CLINICAL DATA:  Shortness of breath EXAM: PORTABLE CHEST 1 VIEW COMPARISON:  04/08/2021 FINDINGS: Low lung volumes. No frank interstitial edema or focal consolidation. Mild left basilar opacity, likely atelectasis with trace left pleural effusion. Cardiomegaly. Postsurgical changes related to prior CABG. Thoracic aortic atherosclerosis. Median sternotomy. IMPRESSION: Mild left basilar opacity, likely atelectasis with trace left pleural effusion. Electronically Signed   By: Julian Hy M.D.   On: 04/13/2021 19:55   DG Chest Port 1 View  Result Date: 04/04/2021 CLINICAL DATA:  Dysphagia EXAM: PORTABLE CHEST 1 VIEW COMPARISON:  Chest x-ray 03/25/2021 FINDINGS: Heart is enlarged. Mediastinum appears stable. Calcified plaques in the aortic arch. Cardiac surgical changes and median sternotomy wires. Pulmonary vasculature is within normal limits. No focal consolidation identified. No pleural effusion or pneumothorax. IMPRESSION: Cardiomegaly with no acute process identified. Electronically Signed   By: Ofilia Neas M.D.   On: 04/04/2021 10:49   DG Chest Port 1 View  Result Date: 03/25/2021 CLINICAL DATA:  Fever.  Positive COVID-19 test 03/14/2021. EXAM: PORTABLE CHEST 1 VIEW COMPARISON:  Chest two views 03/20/2021 FINDINGS: Status post median sternotomy. Cardiac silhouette is again mildly to moderately enlarged. Mediastinal contours are unchanged with calcification moderately seen within the aortic arch. Mildly decreased lung volumes. Mild scattered  bilateral mid and lower lung linear densities, greatest within the left mid and lower lung, similar to prior. No focal airspace opacity to indicate pneumonia. No pulmonary edema, pleural effusion, or pneumothorax. No acute skeletal abnormality. IMPRESSION: No significant change from prior. Mild scattered linear densities that may represent atelectasis versus the sequelae of recent COVID infection. No focal airspace consolidation. Electronically  Signed   By: Yvonne Kendall   On: 03/25/2021 19:06   ECHOCARDIOGRAM COMPLETE  Result Date: 04/14/2021    ECHOCARDIOGRAM REPORT   Patient Name:   Gregory [name] Date of Exam: 04/14/2021 Medical Rec #:  [id number]       Height:       66.0 in Accession #:    [id number]      Weight:       177.9 lb Date of Birth:  [birth year]       BSA:          1.903 m Patient Age:    15 years        BP:           141/60 mmHg Patient Gender: M               HR:           67 bpm. Exam Location:  Inpatient Procedure: 2D Echo, 3D Echo, Cardiac Doppler and Color Doppler Indications:    CHF-Acute Systolic D42.87  History:        Patient has prior history of Echocardiogram examinations, most                 recent 03/02/2018. Ischemic cardiomyopathy. Murmur. Coronary                 artery disease. Chongetive heart failure. Stroke. Dyslipidemia.                 Sick sinus syndrome.  Sonographer:    Darlina Sicilian RDCS Referring Phys: [id number] [place]  Sonographer Comments: Suboptimal subcostal window. IMPRESSIONS  1. Left ventricular ejection fraction by 3D volume is 42 %. The left ventricle has moderately decreased function. The left ventricle has no regional wall motion abnormalities. There is mild concentric left ventricular hypertrophy. Left ventricular diastolic parameters are consistent with Grade I diastolic dysfunction (impaired relaxation).  2. Right ventricular systolic function is normal. The right ventricular size is normal.  3. Left atrial size was severely dilated.  4. The mitral valve is normal in structure. Mild mitral valve regurgitation. No evidence of mitral stenosis.  5. The aortic valve is normal in structure. Aortic valve regurgitation is mild. Aortic valve sclerosis is present, with no evidence of aortic valve stenosis. Aortic regurgitation PHT measures 670 msec.  6. There is borderline dilatation of the aortic root, measuring 37 mm.  7. The inferior vena cava is normal in size with greater than 50%  respiratory variability, suggesting right atrial pressure of 3 mmHg. FINDINGS  Left Ventricle: Left ventricular ejection fraction by 3D volume is 42 %. The left ventricle has moderately decreased function. The left ventricle has no regional wall motion abnormalities. The left ventricular internal cavity size was normal in size. There is mild concentric left ventricular hypertrophy. Left ventricular diastolic parameters are consistent with Grade I diastolic dysfunction (impaired relaxation). Right Ventricle: The right ventricular size is normal. No increase in right ventricular wall thickness. Right ventricular systolic function is normal. Left Atrium: Left atrial size was severely dilated. Right Atrium: Right atrial size was normal in size. Pericardium:  There is no evidence of pericardial effusion. Mitral Valve: The mitral valve is normal in structure. Mild mitral valve regurgitation. No evidence of mitral valve stenosis. Tricuspid Valve: The tricuspid valve is normal in structure. Tricuspid valve regurgitation is not demonstrated. No evidence of tricuspid stenosis. Aortic Valve: The aortic valve is normal in structure. Aortic valve regurgitation is mild. Aortic regurgitation PHT measures 670 msec. Aortic valve sclerosis is present, with no evidence of aortic valve stenosis. Pulmonic Valve: The pulmonic valve was normal in structure. Pulmonic valve regurgitation is not visualized. No evidence of pulmonic stenosis. Aorta: There is borderline dilatation of the aortic root, measuring 37 mm. Venous: The inferior vena cava is normal in size with greater than 50% respiratory variability, suggesting right atrial pressure of 3 mmHg. IAS/Shunts: No atrial level shunt detected by color flow Doppler.  LEFT VENTRICLE PLAX 2D LVIDd:         5.00 cm         Diastology LVIDs:         3.80 cm         LV e' medial:    6.31 cm/s LV PW:         1.05 cm         LV E/e' medial:  14.1 LV IVS:        1.10 cm         LV e' lateral:   11.60  cm/s LVOT diam:     2.50 cm         LV E/e' lateral: 7.7 LV SV:         57 LV SV Index:   30 LVOT Area:     4.91 cm        3D Volume EF                                LV 3D EF:    Left                                             ventricul                                             ar                                             ejection                                             fraction                                             by 3D  volume is                                             42 %.                                 3D Volume EF:                                3D EF:        42 %                                LV EDV:       141 ml                                LV ESV:       82 ml                                LV SV:        59 ml LEFT ATRIUM              Index LA diam:        5.40 cm  2.84 cm/m LA Vol (A2C):   105.0 ml 55.18 ml/m LA Vol (A4C):   156.0 ml 81.99 ml/m LA Biplane Vol: 135.0 ml 70.95 ml/m  AORTIC VALVE LVOT Vmax:   65.30 cm/s LVOT Vmean:  44.300 cm/s LVOT VTI:    0.117 m AI PHT:      670 msec  AORTA Ao Root diam: 3.70 cm Ao Asc diam:  3.40 cm MITRAL VALVE MV Area (PHT): 6.80 cm    SHUNTS MV Decel Time: 112 msec    Systemic VTI:  0.12 m MV E velocity: 88.90 cm/s  Systemic Diam: 2.50 cm Kardie Tobb DO Electronically signed by Berniece Salines DO Signature Date/Time: 04/14/2021/12:38:44 PM    Final    DG Hips Bilat W or Wo Pelvis 3-4 Views  Result Date: 03/25/2021 CLINICAL DATA:  Bilateral upper leg pain EXAM: DG HIP (WITH OR WITHOUT PELVIS) 3-4V BILAT COMPARISON:  03/14/2021. FINDINGS: There is no evidence of hip fracture or dislocation. Hip joint spaces are maintained. Lower lumbar degenerative change. Calcific atherosclerosis. IMPRESSION: Negative. Electronically Signed   By: Margaretha Sheffield M.D.   On: 03/25/2021 13:38   CT CHEST ABDOMEN PELVIS WO CONTRAST  Result Date: 03/20/2021 CLINICAL DATA:  Nausea, dysphagia, COVID pneumonia EXAM: CT  CHEST, ABDOMEN AND PELVIS WITHOUT CONTRAST TECHNIQUE: Multidetector CT imaging of the chest, abdomen and pelvis was performed following the standard protocol without IV contrast. COMPARISON:  03/20/2021, 05/19/2016 FINDINGS: CT CHEST FINDINGS Cardiovascular: Unenhanced imaging of the heart and great vessels demonstrates mild cardiomegaly without pericardial effusion. Minimal gas within the right atrium likely from IV placement or phlebotomy. Normal caliber of the thoracic aorta. Postsurgical changes are seen from coronary artery bypass. Evaluation of the vascular lumen is limited without IV contrast. There is severe atherosclerosis of the aorta and native coronary vessels. Mediastinum/Nodes: Borderline enlarged lymph nodes measuring up to 13 mm in short axis  in the subcarinal region. Punctate calcifications may suggest previous granulomatous disease. Thyroid, trachea, and esophagus are unremarkable. Lungs/Pleura: Basilar predominant interstitial and ground-glass opacities are identified, consistent with multifocal pneumonia or inflammation. No effusion or pneumothorax. The central airways are patent. Musculoskeletal: No acute or destructive bony lesions. Reconstructed images demonstrate no additional findings. CT ABDOMEN PELVIS FINDINGS Hepatobiliary: Unremarkable unenhanced appearance of the liver and gallbladder. Pancreas: Stable fatty infiltration of the pancreas. No acute inflammatory changes. Spleen: Stable appearance of the spleen, which may reflect remote trauma or infarct. No focal abnormalities on this unenhanced exam. Adrenals/Urinary Tract: Cortical thinning and scarring within the left kidney unchanged. Calcifications of the bilateral renal hila are likely vascular. No urinary tract calculi or obstructive uropathy. The adrenals are unremarkable. Bladder is moderately distended without filling defect. Stomach/Bowel: No bowel obstruction or ileus. No bowel wall thickening or inflammatory change. Scattered  colonic diverticulosis without diverticulitis. Vascular/Lymphatic: Diffuse atherosclerosis of the aorta and its branches. Subcentimeter lymph nodes are seen within the central mesentery, with mild surrounding mesenteric fat stranding. Findings could reflect mesenteric adenitis or sequela of gastroenteritis. Reproductive: Prostate is unremarkable. Other: There is no free fluid or free gas. No abdominal wall hernia. Musculoskeletal: No acute or destructive bony lesions. Reconstructed images demonstrate no additional findings. IMPRESSION: 1. Scattered basilar predominant interstitial and ground-glass opacities, consistent with nonspecific inflammatory or infectious etiology. 2. Borderline enlarged mediastinal lymph nodes, likely reactive. 3. Subcentimeter central mesenteric lymph nodes with mild mesenteric fat stranding. This could reflect sequela of mesenteric adenitis or gastroenteritis. 4.  Aortic Atherosclerosis (ICD10-I70.0). Electronically Signed   By: Randa Ngo M.D.   On: 03/20/2021 17:46    Microbiology: Recent Results (from the past 240 hour(s))  Resp Panel by RT-PCR (Flu A&B, Covid) Nasopharyngeal Swab     Status: None   Collection Time: 04/13/21  7:29 PM   Specimen: Nasopharyngeal Swab; Nasopharyngeal(NP) swabs in vial transport medium  Result Value Ref Range Status   SARS Coronavirus 2 by RT PCR NEGATIVE NEGATIVE Final    Comment: (NOTE) SARS-CoV-2 target nucleic acids are NOT DETECTED.  The SARS-CoV-2 RNA is generally detectable in upper respiratory specimens during the acute phase of infection. The lowest concentration of SARS-CoV-2 viral copies this assay can detect is 138 copies/mL. A negative result does not preclude SARS-Cov-2 infection and should not be used as the sole basis for treatment or other patient management decisions. A negative result may occur with  improper specimen collection/handling, submission of specimen other than nasopharyngeal swab, presence of viral  mutation(s) within the areas targeted by this assay, and inadequate number of viral copies(<138 copies/mL). A negative result must be combined with clinical observations, patient history, and epidemiological information. The expected result is Negative.  Fact Sheet for Patients:  EntrepreneurPulse.com.au  Fact Sheet for Healthcare Providers:  IncredibleEmployment.be  This test is no t yet approved or cleared by the [place] FDA and  has been authorized for detection and/or diagnosis of SARS-CoV-2 by FDA under an Emergency Use Authorization (EUA). This EUA will remain  in effect (meaning this test can be used) for the duration of the COVID-19 declaration under Section 564(b)(1) of the Act, 21 U.S.C.section 360bbb-3(b)(1), unless the authorization is terminated  or revoked sooner.       Influenza A by PCR NEGATIVE NEGATIVE Final   Influenza B by PCR NEGATIVE NEGATIVE Final    Comment: (NOTE) The Xpert Xpress SARS-CoV-2/FLU/RSV plus assay is intended as an aid in the diagnosis of influenza from Nasopharyngeal swab specimens  and should not be used as a sole basis for treatment. Nasal washings and aspirates are unacceptable for Xpert Xpress SARS-CoV-2/FLU/RSV testing.  Fact Sheet for Patients: EntrepreneurPulse.com.au  Fact Sheet for Healthcare Providers: IncredibleEmployment.be  This test is not yet approved or cleared by the [place] FDA and has been authorized for detection and/or diagnosis of SARS-CoV-2 by FDA under an Emergency Use Authorization (EUA). This EUA will remain in effect (meaning this test can be used) for the duration of the COVID-19 declaration under Section 564(b)(1) of the Act, 21 U.S.C. section 360bbb-3(b)(1), unless the authorization is terminated or revoked.  Performed at [facility], [place] [address], [place], [place] [address]      Labs: Basic Metabolic  Panel: Recent Labs  Lab 04/13/21 1920 04/14/21 0402 04/15/21 0141 04/16/21 0630 04/17/21 0229  NA 132* 134* 133* 133* 132*  K 4.4 4.0 3.5 3.6 3.2*  CL 99 96* 92* 91* 94*  CO2 28 29 33* 34* 30  GLUCOSE 117* 104* 124* 135* 110*  BUN 20 19 22  28* 28*  CREATININE 1.08 1.06 1.24 1.57* 1.42*  CALCIUM 7.7* 8.3* 8.0* 8.0* 8.0*  MG 1.6* 2.0  --  1.6*  --    Liver Function Tests: Recent Labs  Lab 04/14/21 0402  AST 114*  ALT 70*  ALKPHOS 90  BILITOT 0.5  PROT 6.3*  ALBUMIN 1.9*   No results for input(s): LIPASE, AMYLASE in the last 168 hours. No results for input(s): AMMONIA in the last 168 hours. CBC: Recent Labs  Lab 04/13/21 1920 04/14/21 0402 04/16/21 0630 04/17/21 0229  WBC 8.9 10.0 8.5 10.8*  NEUTROABS 6.3 7.1  --   --   HGB 8.5* 9.7* 8.2* 8.2*  HCT 26.6* 29.2* 26.1* 24.9*  MCV 97.8 94.5 94.9 94.0  PLT 282 306 283 278   Cardiac Enzymes: No results for input(s): CKTOTAL, CKMB, CKMBINDEX, TROPONINI in the last 168 hours. BNP: BNP (last 3 results) Recent Labs    04/13/21 1920  BNP 489.7*    ProBNP (last 3 results) No results for input(s): PROBNP in the last 8760 hours.  CBG: No results for input(s): GLUCAP in the last 168 hours.     Signed:  Domenic Polite MD.  [facility] 04/17/2021, 2:35 PM

## 2021-04-17 NOTE — Progress Notes (Signed)
Progress Note   Patient: Gregory [name] YJE:[id number] DOB: 1932-06-27 DOA: 04/13/2021     4 DOS: the patient was seen and examined on 04/17/2021   Brief hospital course: [name] was admitted to the hospital with the working diagnosis of acute on chronic heart failure decompensation.   88 yo male with the past medical history of coronary artery disease, sp CABG 2003, atrial fibrillation, HTN, heart failure, CKD, chronic anemia and GERD who presented with dyspnea. Recent hospitalization at [facility] 01/20 to 01/25 for dysphagia, underwent EGD and esophageal dilatation. Patient reported 2 days of worsening dyspnea, associated with lower extremity edema, and productive cough. Chest radiograph with cardiomegaly and bilateral hilar vascular congestion.  2D ECHo noted EF of 42%  Assessment and Plan  * Acute on chronic systolic CHF (congestive heart failure) ([place])- (present on admission) Echocardiogram with LV EF 42% with no regional wall abnormalities, preserved RV systolic function, borderline dilatation of aortic root.  -Known Ischemic cardiomyopathy, EF has fluctuated from 25-45% -improved with diuresis -negative 4L -will hold lasix today with bump in craetinine -pt and family would like to discontinue farxiga with h/o recurrent UTIs -continue carvedilol -further titrate GDMT as BP/kidney function tolerates    Fever Etiology not clear, possible bronchitis, will check UA, CXR, procalcitonin  CAD (coronary artery disease),CABG 1993-LIMA to the LAD, SVG to Om and SVG to PDA/PLA, patent on cath 2014.- (present on admission) No signs of acute coronary syndrome.  Continue with carvedilol and ezetimibe/ rosuvastatin    Atrial fibrillation, chronic (HCC)- (present on admission) HR controlled with carvedilol, continue anticoagulation with apixaban.,    Chronic kidney disease, stage 3a ([place])- (present on admission) Hyponatremia  -creatinine trended upto to 1.6, lasix/aldactone held -restart  lasix tomorrow   Protein-calorie malnutrition, severe Continue with nutritional supplements, will liberate diet to regular.   Pressure injury of sacral region, stage 1- (present on admission) Continue care to stage 1 pressure ulcer. PT and OT , out of bed to chair tid with meals   Hypothyroidism- (present on admission) Continue with levothyroxine     Mixed hyperlipidemia- (present on admission) Continue with ezetimibe and rosuvastatin    Essential hypertension- (present on admission) - continue carvedilol.     GERD without esophagitis- (present on admission) On pantoprazole    Hypomagnesemia-resolved as of 04/14/2021, (present on admission) Replacing with intravenous magnesium sulfate Monitoring magnesium levels with serial chemistries.        Subjective: fever last night, no other complaints  Objective BP (!) 106/46    Pulse 73    Temp (!) 97.4 F (36.3 C) (Oral)    Resp 18    Ht 5\' 6"  (1.676 m)    Wt 75.5 kg    SpO2 97%    BMI 26.87 kg/m   Gen: Awake, Alert, Oriented X 2 no distress HEENT: no JVD Lungs: rare basilar rales CVS: S1S2/RRR Abd: soft, Non tender, non distended, BS present Extremities: No edema Skin: no new rashes on exposed skin   Data Reviewed:    Family Communication: I spoke with patient's daughter at the bedside, we talked in detail about patient's condition, plan of care and prognosis and all questions were addressed.   Disposition: Status is: Inpatient Remains inpatient appropriate because: heart failure management with IV furosemide   Planned Discharge Destination: Home with Home Health             Author: Domenic Polite, MD 04/17/2021 9:52 AM  For on call review [email or web address].

## 2021-04-17 NOTE — Progress Notes (Signed)
Potassium 3.2 this morning, provider notified.  WBC increased to 10.8 (from 8.5).

## 2021-04-17 NOTE — Progress Notes (Signed)
Occupational Therapy Evaluation Patient Details Name: Gregory [name] MRN: [id number] DOB: 02/14/1933 Today's Date: 04/17/2021   History of Present Illness 88 yo male presenting 1/29 with SOB, LE edema, and generalized weakness since d/c on 1/25. Found to have volume overload and elevated BNP. Of note, pt reccently admitted 1/20-1/25 with decreased appetite, upper abdominal pain, and dysphagia. Pt underwent EGD and dilation on 1/23 and was d/c home with 24/7 assist. PMH includes: CAD s/p CABG 2003, afib, HTN, ischemic cardiomyopathy with systolic heart failure, CKD III, chronic anemia, HLD, recurrent esophageal stricture, and failure to thrive.   Clinical Impression   Pt progressing towards established OT goals and demonstrating increased activity tolerance. Pt performing functional mobility in hallway with Min Guard A and RW. Cues for reducing shuffling gait and increase safety. Discussing safety at home with pt and daughter; both verbalized understanding of fall prevention techniques and safe tub transfer for showers.  Continue to recommend dc to home with HHOT. Answered all questions in preparation for dc later today.      Recommendations for follow up therapy are one component of a multi-disciplinary discharge planning process, led by the attending physician.  Recommendations may be updated based on patient status, additional functional criteria and insurance authorization.   Follow Up Recommendations  Home health OT    Assistance Recommended at Discharge Frequent or constant Supervision/Assistance  Patient can return home with the following A little help with walking and/or transfers;A little help with bathing/dressing/bathroom;Assistance with cooking/housework;Help with stairs or ramp for entrance    Functional Status Assessment     Equipment Recommendations  None recommended by OT    Recommendations for Other Services PT consult     Precautions / Restrictions  Precautions Precautions: Fall Precaution Comments: monitor O2 + HR Restrictions Weight Bearing Restrictions: No      Mobility Bed Mobility               General bed mobility comments: up in chair    Transfers Overall transfer level: Needs assistance Equipment used: Rolling walker (2 wheels) Transfers: Sit to/from Stand Sit to Stand: Min guard           General transfer comment: Min Guard A for safety. cues for weight shift forward      Balance Overall balance assessment: Needs assistance Sitting-balance support: Feet supported Sitting balance-Leahy Scale: Fair     Standing balance support: Bilateral upper extremity supported, During functional activity Standing balance-Leahy Scale: Poor Standing balance comment: Reliant on UE support                           ADL either performed or assessed with clinical judgement   ADL Overall ADL's : Needs assistance/impaired                         Toilet Transfer: Min guard;Ambulation;Rolling walker (2 wheels) (simulated to recliner) Toilet Transfer Details (indicate cue type and reason): Min Guard A for safety       Tub/Shower Transfer Details (indicate cue type and reason): Discussed safe tub transfer with daughter and pt. Both verablized comfortability with this and plan on replacing the tub with a walk in soon Functional mobility during ADLs: Rolling walker (2 wheels);Min guard General ADL Comments: Pt performing functional mobility in hallway with Min Guard A and RW. Cues for "big steps" to reduce shuffling gait. Pt demonstrating increased acitvity tolerance this session.  Vision         Perception     Praxis      Pertinent Vitals/Pain Pain Assessment Pain Assessment: Faces Faces Pain Scale: Hurts a little bit Pain Location: generalized Pain Descriptors / Indicators: Sore Pain Intervention(s): Monitored during session, Limited activity within patient's tolerance, Repositioned      Hand Dominance     Extremity/Trunk Assessment Upper Extremity Assessment Upper Extremity Assessment: Generalized weakness   Lower Extremity Assessment Lower Extremity Assessment: Defer to PT evaluation LLE Deficits / Details: hx of L weakness since CVA per family, significant bilateral edema       Communication     Cognition Arousal/Alertness: Awake/alert Behavior During Therapy: WFL for tasks assessed/performed Overall Cognitive Status: Within Functional Limits for tasks assessed                                 General Comments: reasonable hearing and interacting in conversation     General Comments  VSS on RA. Daughter with questions about resuming HEP from [facility] therapist. recommending resume as can tolerate    Exercises     Shoulder Instructions      Home Living                                          Prior Functioning/Environment                          OT Problem List:        OT Treatment/Interventions:      OT Goals(Current goals can be found in the care plan section) Acute Rehab OT Goals OT Goal Formulation: With patient Time For Goal Achievement: 04/29/21 Potential to Achieve Goals: Good ADL Goals Pt Will Perform Grooming: with supervision;standing Pt Will Perform Lower Body Dressing: with supervision;sit to/from stand;sitting/lateral leans Pt Will Transfer to Toilet: with supervision;ambulating;regular height toilet Pt Will Perform Toileting - Clothing Manipulation and hygiene: with supervision;sitting/lateral leans;sit to/from stand Pt Will Perform Tub/Shower Transfer: with min guard assist;shower seat;ambulating;Tub transfer;rolling walker  OT Frequency: Min 2X/week    Co-evaluation              AM-PAC OT "6 Clicks" Daily Activity     Outcome Measure Help from another person eating meals?: A Little Help from another person taking care of personal grooming?: A Little Help from another person  toileting, which includes using toliet, bedpan, or urinal?: A Lot Help from another person bathing (including washing, rinsing, drying)?: A Little Help from another person to put on and taking off regular upper body clothing?: A Little Help from another person to put on and taking off regular lower body clothing?: A Little 6 Click Score: 17   End of Session Equipment Utilized During Treatment: Gait belt;Rolling walker (2 wheels) Nurse Communication: Mobility status  Activity Tolerance: Patient tolerated treatment well Patient left: in chair;with call bell/phone within reach;with family/visitor present  OT Visit Diagnosis: Unsteadiness on feet (R26.81);Muscle weakness (generalized) (M62.81)                Time: 1012-1029 OT Time Calculation (min): 17 min Charges:  OT General Charges $OT Visit: 1 Visit OT Treatments $Therapeutic Activity: 8-22 mins  [place], OTR/L Acute Rehab Pager: [phone] Office: [place] 04/17/2021, 10:40 AM

## 2021-04-17 NOTE — Assessment & Plan Note (Signed)
Etiology not clear, possible bronchitis, will check UA, CXR, procalcitonin

## 2021-04-17 NOTE — Plan of Care (Signed)
°  Problem: Education: Goal: Ability to demonstrate management of disease process will improve Outcome: Adequate for Discharge   Problem: Education: Goal: Ability to verbalize understanding of medication therapies will improve Outcome: Adequate for Discharge   Problem: Education: Goal: Individualized Educational Video(s) Outcome: Adequate for Discharge   Problem: Activity: Goal: Capacity to carry out activities will improve Outcome: Adequate for Discharge   Problem: Cardiac: Goal: Ability to achieve and maintain adequate cardiopulmonary perfusion will improve Outcome: Adequate for Discharge   Problem: Education: Goal: Knowledge of General Education information will improve Description: Including pain rating scale, medication(s)/side effects and non-pharmacologic comfort measures Outcome: Adequate for Discharge   Problem: Health Behavior/Discharge Planning: Goal: Ability to manage health-related needs will improve Outcome: Adequate for Discharge   Problem: Clinical Measurements: Goal: Ability to maintain clinical measurements within normal limits will improve Outcome: Adequate for Discharge   Problem: Clinical Measurements: Goal: Will remain free from infection Outcome: Adequate for Discharge   Problem: Clinical Measurements: Goal: Diagnostic test results will improve Outcome: Adequate for Discharge   Problem: Clinical Measurements: Goal: Respiratory complications will improve Outcome: Adequate for Discharge   Problem: Clinical Measurements: Goal: Cardiovascular complication will be avoided Outcome: Adequate for Discharge   Problem: Activity: Goal: Risk for activity intolerance will decrease Outcome: Adequate for Discharge   Problem: Nutrition: Goal: Adequate nutrition will be maintained Outcome: Adequate for Discharge   Problem: Coping: Goal: Level of anxiety will decrease Outcome: Adequate for Discharge   Problem: Elimination: Goal: Will not experience  complications related to bowel motility Outcome: Adequate for Discharge   Problem: Elimination: Goal: Will not experience complications related to urinary retention Outcome: Adequate for Discharge   Problem: Pain Managment: Goal: General experience of comfort will improve Outcome: Adequate for Discharge   Problem: Safety: Goal: Ability to remain free from injury will improve Outcome: Adequate for Discharge   Problem: Skin Integrity: Goal: Risk for impaired skin integrity will decrease Outcome: Adequate for Discharge   Problem: Education: Goal: Ability to demonstrate management of disease process will improve Outcome: Adequate for Discharge   Problem: Education: Goal: Ability to verbalize understanding of medication therapies will improve Outcome: Adequate for Discharge   Problem: Education: Goal: Individualized Educational Video(s) Outcome: Adequate for Discharge   Problem: Cardiac: Goal: Ability to achieve and maintain adequate cardiopulmonary perfusion will improve Outcome: Adequate for Discharge   Problem: Activity: Goal: Capacity to carry out activities will improve Outcome: Adequate for Discharge   Problem: Education: Goal: Individualized Educational Video(s) Outcome: Adequate for Discharge   Problem: Education: Goal: Ability to verbalize understanding of medication therapies will improve Outcome: Adequate for Discharge   Problem: Education: Goal: Ability to demonstrate management of disease process will improve Outcome: Adequate for Discharge   Problem: Skin Integrity: Goal: Risk for impaired skin integrity will decrease Outcome: Adequate for Discharge

## 2021-04-17 NOTE — TOC Transition Note (Signed)
Transition of Care [facility]) - CM/SW Discharge Note   Patient Details  Name: Gregory [name] MRN: [id number] Date of Birth: [birth year]  [facility] of [facility]) CM/SW Contact:  Zenon Mayo, RN Phone Number: 04/17/2021, 9:46 AM   Clinical Narrative:    Patient is for dc today, NCM notified Tommi Rumps with [facility] of dc today.    Final next level of care: [place] Barriers to Discharge: Continued Medical Work up   Patient Goals and CMS Choice Patient states their goals for this hospitalization and ongoing recovery are:: return home with family CMS Medicare.gov Compare Post Acute Care list provided to:: Patient Represenative (must comment) Choice offered to / list presented to : Adult Children  Discharge Placement                       Discharge Plan and Services In-house Referral: NA Discharge Planning Services: CM Consult Post Acute Care Choice: Home Health            DME Agency: NA       HH Arranged: RN, Disease Management, PT [place] Agency: [place] Date [facility] Agency Contacted: 04/14/21 Time [place]: [phone] Representative spoke with at [place]: Tommi Rumps  Social Determinants of Health (SDOH) Interventions Food Insecurity Interventions: Intervention Not Indicated Housing Interventions: Intervention Not Indicated Transportation Interventions: Intervention Not Indicated   Readmission Risk Interventions Readmission Risk Prevention Plan 04/14/2021 04/08/2021  Transportation Screening Complete Complete  PCP or Specialist Appt within 5-7 Days - Complete  PCP or Specialist Appt within 3-5 Days Complete -  Home Care Screening - Complete  Medication Review (RN CM) - Complete  HRI or Home Care Consult Complete -  Social Work Consult for [place] Planning/Counseling Complete -  Palliative Care Screening Not Applicable -  Medication Review Press photographer) Complete -  Some recent data might be hidden

## 2021-04-17 NOTE — Care Management Important Message (Signed)
Important Message  Patient Details  Name: Gregory [name] MRN: [id number] Date of Birth: 1932/12/18   Medicare Important Message Given:  Yes     [name] 04/17/2021, 9:30 AM

## 2021-04-18 ENCOUNTER — Telehealth: Payer: Self-pay

## 2021-04-18 DIAGNOSIS — E782 Mixed hyperlipidemia: Secondary | ICD-10-CM | POA: Diagnosis not present

## 2021-04-18 DIAGNOSIS — I255 Ischemic cardiomyopathy: Secondary | ICD-10-CM | POA: Diagnosis not present

## 2021-04-18 DIAGNOSIS — N1831 Chronic kidney disease, stage 3a: Secondary | ICD-10-CM | POA: Diagnosis not present

## 2021-04-18 DIAGNOSIS — I130 Hypertensive heart and chronic kidney disease with heart failure and stage 1 through stage 4 chronic kidney disease, or unspecified chronic kidney disease: Secondary | ICD-10-CM | POA: Diagnosis not present

## 2021-04-18 DIAGNOSIS — M5126 Other intervertebral disc displacement, lumbar region: Secondary | ICD-10-CM | POA: Diagnosis not present

## 2021-04-18 DIAGNOSIS — D631 Anemia in chronic kidney disease: Secondary | ICD-10-CM | POA: Diagnosis not present

## 2021-04-18 DIAGNOSIS — G2581 Restless legs syndrome: Secondary | ICD-10-CM | POA: Diagnosis not present

## 2021-04-18 DIAGNOSIS — M2578 Osteophyte, vertebrae: Secondary | ICD-10-CM | POA: Diagnosis not present

## 2021-04-18 DIAGNOSIS — I083 Combined rheumatic disorders of mitral, aortic and tricuspid valves: Secondary | ICD-10-CM | POA: Diagnosis not present

## 2021-04-18 DIAGNOSIS — M5127 Other intervertebral disc displacement, lumbosacral region: Secondary | ICD-10-CM | POA: Diagnosis not present

## 2021-04-18 DIAGNOSIS — K219 Gastro-esophageal reflux disease without esophagitis: Secondary | ICD-10-CM | POA: Diagnosis not present

## 2021-04-18 DIAGNOSIS — M48061 Spinal stenosis, lumbar region without neurogenic claudication: Secondary | ICD-10-CM | POA: Diagnosis not present

## 2021-04-18 DIAGNOSIS — I5023 Acute on chronic systolic (congestive) heart failure: Secondary | ICD-10-CM | POA: Diagnosis not present

## 2021-04-18 DIAGNOSIS — I700 Atherosclerosis of aorta: Secondary | ICD-10-CM | POA: Diagnosis not present

## 2021-04-18 DIAGNOSIS — I495 Sick sinus syndrome: Secondary | ICD-10-CM | POA: Diagnosis not present

## 2021-04-18 DIAGNOSIS — J9600 Acute respiratory failure, unspecified whether with hypoxia or hypercapnia: Secondary | ICD-10-CM | POA: Diagnosis not present

## 2021-04-18 DIAGNOSIS — D509 Iron deficiency anemia, unspecified: Secondary | ICD-10-CM | POA: Diagnosis not present

## 2021-04-18 DIAGNOSIS — E43 Unspecified severe protein-calorie malnutrition: Secondary | ICD-10-CM | POA: Diagnosis not present

## 2021-04-18 DIAGNOSIS — M4326 Fusion of spine, lumbar region: Secondary | ICD-10-CM | POA: Diagnosis not present

## 2021-04-18 DIAGNOSIS — J449 Chronic obstructive pulmonary disease, unspecified: Secondary | ICD-10-CM | POA: Diagnosis not present

## 2021-04-18 DIAGNOSIS — M170 Bilateral primary osteoarthritis of knee: Secondary | ICD-10-CM | POA: Diagnosis not present

## 2021-04-18 DIAGNOSIS — M47816 Spondylosis without myelopathy or radiculopathy, lumbar region: Secondary | ICD-10-CM | POA: Diagnosis not present

## 2021-04-18 DIAGNOSIS — E039 Hypothyroidism, unspecified: Secondary | ICD-10-CM | POA: Diagnosis not present

## 2021-04-18 DIAGNOSIS — I2510 Atherosclerotic heart disease of native coronary artery without angina pectoris: Secondary | ICD-10-CM | POA: Diagnosis not present

## 2021-04-18 DIAGNOSIS — I482 Chronic atrial fibrillation, unspecified: Secondary | ICD-10-CM | POA: Diagnosis not present

## 2021-04-18 NOTE — Telephone Encounter (Signed)
Transition Care Management Follow-up Telephone Call Date of discharge and from where: 04/17/2021-[place] How have you been since you were released from the hospital? Per daughter, paient is doing "fantastic" . She says he is doing much better & getting around well with his walker. Any questions or concerns? No  Items Reviewed: Did the pt receive and understand the discharge instructions provided? Yes Daughter states discharge instructions say to stop the ropinirole but she says he is still taking it because he cannot do without it at night. She says she ask the nurse in the hospital why he needed to stop it & says no one could tell her why. I advised her to follow the discharge instructions & discuss with PCP at office visit on Monday Medications obtained and verified? Yes  Other? Yes  Any new allergies since your discharge? No  Dietary orders reviewed? Yes Do you have support at home? Yes   Home Care and Equipment/Supplies: Were home health services ordered? yes If so, what is the name of the agency? [facility]  Has the agency set up a time to come to the patient's home? yes Were any new equipment or medical supplies ordered?  No What is the name of the medical supply agency? N/a Were you able to get the supplies/equipment? not applicable Do you have any questions related to the use of the equipment or supplies? N/a  Functional Questionnaire: (I = Independent and D = Dependent) ADLs: I with assistance  Bathing/Dressing- I with assistance  Meal Prep- D  Eating- I  Maintaining continence- I  Transferring/Ambulation- I with walker  Managing Meds- D  Follow up appointments reviewed:  PCP Hospital f/u appt confirmed? Yes  Scheduled to see Dr. Lorelei Pont on 04/21/2021 @ 1:00. [place] [facility] f/u appt confirmed? Yes  Scheduled to see Cardiology on 04/28/2021 @ 2:00. Are transportation arrangements needed? No  If their condition worsens, is the pt aware to call PCP or go to the  Emergency Dept.? Yes Was the patient provided with contact information for the PCP's office or ED? Yes Was to pt encouraged to call back with questions or concerns? Yes

## 2021-04-19 LAB — URINE CULTURE
Culture: >=100000 — AB
Special Requests: NORMAL

## 2021-04-19 NOTE — Progress Notes (Addendum)
[facility] [employer] [place], [place], [place] [address] [phone]  Date:  04/21/2021   Name:  Gregory [name]   DOB:  22-Feb-1933   MRN:  [id number]  PCP:  Darreld Mclean, MD    Chief Complaint: Hospitalization Follow-up (CHF)   History of Present Illness:  Gregory [name] is a 88 y.o. very pleasant male patient who presents with the following:  Patient seen today for hospital follow-up-  Patient with history of CAD status post CABG 2003, A. 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  Dr. Carlean Purl had him admitted to the hospital for treatment He was admitted from January 20 through January 25 for treatment of dysphagia, he had an EGD with dilation  Most recent hospital admission as follows-Main issue CHF exacerbation Admit date: 04/13/2021 Discharge date: 04/17/2021  Recommendations for Outpatient Follow-up:  PCP in 1 week Please check BMP at [place], RN  Discharge Diagnoses:  Principal Problem:   Acute on chronic systolic CHF (congestive heart failure) ([place]) Active Problems:   Fever   CAD (coronary artery disease),CABG 1993-LIMA to the LAD, SVG to Om and SVG to PDA/PLA, patent on cath 2014.   Atrial fibrillation, chronic (HCC)   Chronic kidney disease, stage 3a ([place])   GERD without esophagitis   Essential hypertension   Mixed hyperlipidemia   Hypothyroidism   Pressure injury of sacral region, stage  1   Protein-calorie malnutrition, severe      Filed Weights    04/14/21 0200 04/16/21 0537 04/17/21 0342  Weight: 80.7 kg 79.5 kg 75.5 kg    History of present illness:  88 yo male with the past medical history of coronary artery disease, sp CABG 2003, atrial fibrillation, HTN, heart failure, CKD3a, chronic anemia and GERD who presented with dyspnea. Recent hospitalization at [facility] 01/20 to 01/25 for dysphagia, underwent EGD and esophageal dilatation. Patient reported 2 days of worsening dyspnea, associated with lower extremity edema, and productive cough. Chest radiograph with cardiomegaly and bilateral hilar vascular congestion.    Hospital Course:  Acute on chronic systolic CHF (congestive heart failure) ([place])- (present on admission) Echocardiogram with LV EF 42% with no regional wall abnormalities, preserved RV systolic function, borderline dilatation of aortic root.  -Known Ischemic cardiomyopathy, EF has fluctuated from 25-45% -improved with diuresis -negative 4.8L -Lasix 40 Mg daily resumed at discharge -pt and family would like to discontinue farxiga with h/o recurrent UTIs -Continued on carvedilol, consider further GDMT as outpatient if blood pressure tolerates  Fever -Developed low-grade fevers 2 days ago, recent COVID was negative, suspect mild bronchitis, chest x-ray and UA were unremarkable, fever curve improved, started on cefdinir for 5 days  CAD (coronary artery disease),CABG 1993-LIMA to the LAD, SVG to Om and SVG to PDA/PLA, patent on cath 2014.- (present on admission) No signs of acute coronary syndrome.  Continue with carvedilol and ezetimibe/ rosuvastatin  Atrial fibrillation, chronic (HCC)- (present on admission) -HR controlled with carvedilol, continue anticoagulation with apixaban.,  Chronic kidney  disease, stage 3a ([place])- (present on admission) Hyponatremia  -creatinine trended upto to 1.6, lasix/aldactone held -Restarted Lasix at discharge, creatinine stabilized at  1.4 Protein-calorie malnutrition, severe Continue with nutritional supplements, will liberate diet to regular.  Pressure injury of sacral region, stage 1- (present on admission) Continue care to stage 1 pressure ulcer. Hypothyroidism- (present on admission) Continue with levothyroxine  Mixed hyperlipidemia- (present on admission) Continue with ezetimibe and rosuvastatin   Essential hypertension- (present on admission) - continue carvedilol.  GERD without esophagitis- (present on admission) On pantoprazole  Hypomagnesemia -Replaced  Wt Readings from Last 3 Encounters:  04/21/21 170 lb 6.4 oz (77.3 kg)  04/17/21 166 lb 7.2 oz (75.5 kg)  04/04/21 174 lb 2.6 oz (79 kg)   Today patient and his family members note he is making good progress.  His appetite has improved, he is walking around more regularly than he has since he got sick back in December They are monitoring daily weights at home-he has been stable for home scale.  They note his lower extremity edema is back to his normal baseline Taking lasix 40 once daily and kdur 20 meq once daiy  His urine culture from 2/1 did grow E coli - he is taking omnicef, 2 doses left  They are doing some PT exercises at home to build strength Patient Active Problem List   Diagnosis Date Noted   Fever 04/17/2021   Protein-calorie malnutrition, severe 04/15/2021   Pressure injury of sacral region, stage 1 04/14/2021   Acute on chronic systolic CHF (congestive heart failure) ([place]) 04/13/2021   Chronic kidney disease, stage 3a ([place]) 04/13/2021   Chronic diastolic CHF (congestive heart failure) ([place]) 04/06/2021   Essential hypertension 04/06/2021   Mixed hyperlipidemia 04/06/2021   Elevated liver enzymes 04/06/2021   Anemia, unspecified 04/06/2021   Hypothyroidism 04/06/2021   Restless leg syndrome 04/06/2021   Failure to thrive in adult 04/06/2021   Other cirrhosis of liver ([place]) 04/06/2021   GERD without esophagitis 01/26/2019   Hypothyroid  12/12/2018   Prediabetes 12/04/2018   Chronic anemia 01/13/2016   Chronic renal failure, stage 3 (moderate) ([place]) 08/06/2015   Vasovagal syncope    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 03/31/2011   Atrial fibrillation, chronic ([place]) 03/31/2011   Cardiomyopathy, ischemic, improved 03/31/2011   CVA (cerebral vascular accident) ([place]) 03/31/2011   Renal calculi 03/31/2011   History of tobacco use, continues with chewing tobacco 03/31/2011    Past Medical History:  Diagnosis Date   Acute respiratory failure ([place])    Appendicitis with abscess 03/31/2011   Arthritis    CAD (coronary artery disease),CABG 1993-LIMA to the LAD, SVG to Om and SVG to PDA/PLA, patent on cath 2008. 2003   a. s/p CABG 2003. b. last cath 2008 with patent grafts.   Chronic atrial fibrillation (HCC)    permanent   Chronic systolic CHF (congestive heart failure) ([place])    a. Prior low EF, later normalized.   COVID 03/14/2021   Esophageal obstruction due to food impaction    Esophageal stricture    Essential hypertension    Food impaction of  esophagus    GERD (gastroesophageal reflux disease)    Heart murmur    Hyperlipidemia    Ischemic cardiomyopathy    Kidney stones    "passed all but one time when he had to have lithotripsy" (01/21/2013)   Renal infarct ([place])    a. 07/2015 - after holding Xarelto x 3 days for spinal procedure.   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 felt vasovagal in setting of pain from  renal infarct.    Past Surgical History:  Procedure Laterality Date   BALLOON DILATION N/A 05/22/2015   Procedure: BALLOON DILATION;  Surgeon: Gatha Mayer, MD;  Location: [facility] ENDOSCOPY;  Service: Endoscopy;  Laterality: N/A;   BALLOON DILATION N/A 12/15/2018   Procedure: BALLOON DILATION;  Surgeon: Gatha Mayer, MD;  Location: [facility] ENDOSCOPY;  Service: Endoscopy;  Laterality: N/A;   CARDIAC CATHETERIZATION  02/24/2002   reduced LV function, 60-70% prox RCA stenosis, 70% PLA ostial stenosis, 80% secondary branch of PLA stenosis - subsequent CABG (Dr. Jackie Plum)   Carotid Doppler  06/2012   50-69% right bulb/prox ICA diameter reduction; 70-99% left bulb/prox ICA diameter reduction   CATARACT EXTRACTION W/ INTRAOCULAR LENS IMPLANT Bilateral 2013   COLONOSCOPY N/A 05/18/2016   Procedure: COLONOSCOPY;  Surgeon: Manus Gunning, MD;  Location: [facility] ENDOSCOPY;  Service: Gastroenterology;  Laterality: N/A;   CORONARY ANGIOPLASTY  07/05/2006   3 vessel CAD, patent LIMA to LAD, patentVG to OM, patent SVG to PDA & PLA, mild MR, severe LV systolic dysfunction (Dr. Gerrie Nordmann)   CORONARY ARTERY BYPASS GRAFT  03/01/2002   LIMA to LAD, reverse SVG to OM, reverse SVG to PDA of RCA, reverse SVG to PLA of RCA, ligation of LA appendage (Dr. Servando Snare)   ENTEROSCOPY N/A 05/20/2016   Procedure: ENTEROSCOPY;  Surgeon: Manus Gunning, MD;  Location: [facility];  Service: Gastroenterology;  Laterality: N/A;   ESOPHAGOGASTRODUODENOSCOPY  02/01/2012   Procedure: ESOPHAGOGASTRODUODENOSCOPY (EGD);  Surgeon: Beryle Beams, MD;  Location: [facility];  Service: Endoscopy;  Laterality: N/A;   ESOPHAGOGASTRODUODENOSCOPY N/A 05/22/2015   Procedure: ESOPHAGOGASTRODUODENOSCOPY (EGD);  Surgeon: Gatha Mayer, MD;  Location: [facility] ENDOSCOPY;  Service: Endoscopy;  Laterality: N/A;   ESOPHAGOGASTRODUODENOSCOPY N/A 05/17/2016   Procedure: ESOPHAGOGASTRODUODENOSCOPY (EGD);  Surgeon: Juanita Craver, MD;  Location: [facility] ENDOSCOPY;   Service: Endoscopy;  Laterality: N/A;   ESOPHAGOGASTRODUODENOSCOPY N/A 09/17/2017   Procedure: ESOPHAGOGASTRODUODENOSCOPY (EGD);  Surgeon: Milus Banister, MD;  Location: [facility]COPY;  Service: Endoscopy;  Laterality: N/A;   ESOPHAGOGASTRODUODENOSCOPY Left 02/09/2020   Procedure: ESOPHAGOGASTRODUODENOSCOPY (EGD);  Surgeon: Lavena Bullion, DO;  Location: [facility] ENDOSCOPY;  Service: Gastroenterology;  Laterality: Left;   ESOPHAGOGASTRODUODENOSCOPY (EGD) WITH PROPOFOL N/A 12/15/2018   Procedure: ESOPHAGOGASTRODUODENOSCOPY (EGD) WITH PROPOFOL;  Surgeon: Gatha Mayer, MD;  [facility]: [facility]OS[facility];  Service: Endoscopy;  Laterality: N/A;   ESOPHAGOGASTRODUODENOSCOPY (EGD) WITH PROPOFOL N/A 04/07/2021   Procedure: ESOPHAGOGASTRODUODENOSCOPY (EGD) WITH PROPOFOL;  Surgeon: Milus Banister, MD;  Location: [facility] ENDOS[facility];  Service: Endoscopy;  Laterality: N/A;   FOREIGN BODY REMOVAL  09/17/2017   Procedure: FOREIGN BODY REMOVAL;  Surgeon: Milus Banister, MD;  Location[facility] ENDOSCOPY;  Service: Endoscopy;;   FOREIGN BODY REMOVAL  12/15/2018   Procedure: FOREIGN BODY REMOVAL;  Surgeon: Gatha Mayer, MD;  Location: [facility] ENDOSCOPY;  Service: Endoscopy;;   FOREIGN BODY REMOVAL  02/09/2020   Procedure: FOREIGN BODY REMOVAL;  Surgeon: Lavena Bullion, DO;  Location: [facility] [place];  Service: Gastroenterology;;   GIVENS CAPSULE STUDY N/A 05/18/2016   Procedure: GIVENS CAPSULE STUDY;  Surgeon: Manus Gunning, MD;  Location: [place] [facility];  Service: Gastroenterology;  Laterality: N/A;   LAPAROSCOPIC APPENDECTOMY  03/30/2011   Procedure: APPENDECTOMY LAPAROSCOPIC;  Surgeon: Joyice Faster. Cornett, MD;  Location: [place];  Service: General;  Laterality: N/A;   LEFT HEART CATHETERIZATION WITH CORONARY ANGIOGRAM N/A 01/24/2013   Procedure: LEFT HEART CATHETERIZATION WITH CORONARY ANGIOGRAM;  Surgeon: [name], MD;  Location: [facility] CATH LAB;  Service: Cardiovascular;  Laterality: N/A;  Right heart with grafts    LITHOTRIPSY     "once" (01/21/2013)   [place]  05/2009   persantine myoview - fixed moderate perfusion defect in inferior wall & lateral segment of apex (poor non-transmural infarction), minimal anterolateral periinfarct reversible ischemia seen, abnormal study, defects similar to 2006 study   RIGHT/LEFT HEART CATH AND CORONARY ANGIOGRAPHY N/A 09/17/2017   Procedure: RIGHT/LEFT HEART CATH AND CORONARY ANGIOGRAPHY;  Surgeon: Martinique, Peter M, MD;  Location: [facility] CV LAB;  Service: Cardiovascular;  Laterality: N/A;   SPLENECTOMY, TOTAL  01/2013   TEE WITHOUT CARDIOVERSION N/A 01/23/2013   Procedure: TRANSESOPHAGEAL ECHOCARDIOGRAM (TEE);  Surgeon: Sanda Klein, MD;  Location: [facility] ENDOSCOPY;  Service: Cardiovascular;  Laterality: N/A;   TRANSTHORACIC ECHOCARDIOGRAM  06/23/2012   EF 40-45%, mild LVH; mild AV regurg; mild MV regurg; LV mod-severely dilated; RV mildly dilated; systolic pressure borderline increased; RA mod dilated    Social History   Tobacco Use   Smoking status: Former    Packs/day: 2.00    Years: 20.00    Pack years: 40.00    Types: Cigarettes    Quit date: 04/08/1986    Years since quitting: 35.0   Smokeless tobacco: Current    Types: Chew   Tobacco comments:    Encouraged cessation  Vaping Use   Vaping Use: Never used  Substance Use Topics   Alcohol use: No    Alcohol/week: 0.0 standard drinks   Drug use: No    Family History  Problem Relation Age of Onset   Heart disease Father        rheumatic fever   Heart attack Brother 44   Stroke Mother    Stroke Brother 29   Stomach cancer Neg Hx    Rectal cancer Neg Hx    Prostate cancer Neg Hx    Pancreatic cancer Neg Hx    Ovarian cancer Neg Hx     Allergies  Allergen Reactions   Percocet [Oxycodone-Acetaminophen] Itching and Nausea Only    Medication list has been reviewed and updated.  Current Outpatient Medications on File Prior to Visit  Medication Sig Dispense Refill    acetaminophen (TYLENOL) 500 MG tablet Take 500 mg by mouth every 6 (six) hours as needed for moderate pain.     apixaban (ELIQUIS) 2.5 MG TABS tablet TAKE 1 TABLET(2.5 MG) BY MOUTH TWICE DAILY (Patient taking differently: Take 2.5 mg by mouth 2 (two) times daily.) 180 tablet 2   carvedilol (COREG) 3.125 MG tablet Take 1 tablet (3.125 mg total) by mouth 2 (two) times daily with a meal. TAKE 1 TABLET(3.125 MG) BY MOUTH TWICE DAILY WITH A MEAL (Patient taking differently: Take 3.125 mg by mouth 2 (two) times daily with a meal.) 180 tablet 1   cefdinir (OMNICEF) 300 MG capsule Take 1 capsule (300  mg total) by mouth 2 (two) times daily for 5 days. 10 capsule 0   ezetimibe (ZETIA) 10 MG tablet TAKE 1 TABLET(10 MG) BY MOUTH DAILY (Patient taking differently: Take 10 mg by mouth daily.) 90 tablet 1   furosemide (LASIX) 40 MG tablet Take 1 tablet (40 mg total) by mouth daily. 30 tablet 0   levothyroxine (SYNTHROID) 25 MCG tablet TAKE 1 TABLET(25 MCG) BY MOUTH DAILY BEFORE BREAKFAST (Patient taking differently: Take 25 mcg by mouth daily before breakfast.) 90 tablet 1   melatonin 3 MG TABS tablet Take 1 tablet (3 mg total) by mouth at bedtime. 30 tablet 0   Nutritional Supplements (FEEDING SUPPLEMENT, BOOST BREEZE,) LIQD Take 1 each by mouth 3 (three) times daily between meals. 10000 mL 0   pantoprazole (PROTONIX) 40 MG tablet TAKE 1 TABLET(40 MG) BY MOUTH TWICE DAILY (Patient taking differently: Take 40 mg by mouth 2 (two) times daily.) 180 tablet 2   potassium chloride SA (KLOR-CON M) 20 MEQ tablet Take 1 tablet (20 mEq total) by mouth daily. 30 tablet 0   rosuvastatin (CRESTOR) 40 MG tablet TAKE 1 TABLET(40 MG) BY MOUTH DAILY (Patient taking differently: Take 40 mg by mouth daily.) 90 tablet 1   tiZANidine (ZANAFLEX) 4 MG tablet TAKE 1 TABLET(4 MG) BY MOUTH EVERY 8 HOURS AS NEEDED FOR MUSCLE SPASMS. DO NOT COMBINE WITH TRAMADOL (Patient taking differently: Take 4 mg by mouth every 8 (eight) hours as needed for  muscle spasms.) 60 tablet 1   traMADol (ULTRAM) 50 MG tablet Take 1 tablet (50 mg total) by mouth every 6 (six) hours as needed. (Patient taking differently: Take 50 mg by mouth every 6 (six) hours as needed for moderate pain.) 15 tablet 0   No current facility-administered medications on file prior to visit.    Review of Systems:  As per HPI- otherwise negative.   Physical Examination: Vitals:   04/21/21 1308  BP: 110/70  Pulse: 77  Resp: 20  Temp: 98.4 F (36.9 C)  SpO2: 97%   Vitals:   04/21/21 1308  Weight: 170 lb 6.4 oz (77.3 kg)  Height: 5\' 6"  (1.676 m)   Body mass index is 27.5 kg/m. Ideal Body Weight: Weight in (lb) to have BMI = 25: 154.6  GEN: no acute distress.  Looks well and much better than he has recently HEENT: Atraumatic, Normocephalic.  Ears and Nose: No external deformity. CV: Rate controlled atrial fibrillation, No M/G/R. No JVD. No thrill. No extra heart sounds. PULM: CTA B, no wheezes, crackles, rhonchi. No retractions. No resp. distress. No accessory muscle use. ABD: S, NT, ND EXTR: No c/c/he has trace bilateral lower extremity around his sock line, not abnormal for age PSYCH: Normally interactive. Conversant.   Weight has been stable at home  He notes his breathing has been ok  He is drinking 3 bottles of water daily  Assessment and Plan: Hospital discharge follow-up  Acute on chronic systolic congestive heart failure ([place]) - Plan: CBC, CANCELED: Basic metabolic panel  Acute renal insufficiency - Plan: Comprehensive metabolic panel, Magnesium, Phosphorus, CANCELED: Basic metabolic panel  Persistent atrial fibrillation ([place])  Frail elderly  Acute cystitis without hematuria - Plan: Urine Culture  Patient seen today for hospital discharge-recently admitted January 29 through February 2 with CHF exacerbation He was diuresed and is feeling quite a bit better.  He is working on getting his strength back He is currently taking Omnicef, urine  culture came back positive for E. coli.  We  will reculture his urine today to make sure he is clear He has cardiology, gastroenterology and nephrology appointment scheduled They will let me know if any worsening.  Continue to check daily weights and report any unexpected weight gain.  He has an appointment to see me in March  Please fax blood work to Dr Posey Pronto- nephrology  Signed Lamar Blinks, MD  Addendum 2/7, received labs as below  Results for orders placed or performed in visit on 04/21/21  CBC  Result Value Ref Range   WBC 7.0 4.0 - 10.5 K/uL   RBC 3.01 (L) 4.22 - 5.81 Mil/uL   Platelets 426.0 (H) 150.0 - 400.0 K/uL   Hemoglobin 9.1 (L) 13.0 - 17.0 g/dL   HCT 28.2 (L) 39.0 - 52.0 %   MCV 93.5 78.0 - 100.0 fl   MCHC 32.4 30.0 - 36.0 g/dL   RDW 15.4 11.5 - 15.5 %  Comprehensive metabolic panel  Result Value Ref Range   Sodium 135 135 - 145 mEq/L   Potassium 5.4 (H) 3.5 - 5.1 mEq/L   Chloride 97 96 - 112 mEq/L   CO2 35 (H) 19 - 32 mEq/L   Glucose, Bld 93 70 - 99 mg/dL   BUN 33 (H) 6 - 23 mg/dL   Creatinine, Ser 1.33 0.40 - 1.50 mg/dL   Total Bilirubin 0.4 0.2 - 1.2 mg/dL   Alkaline Phosphatase 101 39 - 117 U/L   AST 145 (H) 0 - 37 U/L   ALT 94 (H) 0 - 53 U/L   Total Protein 6.6 6.0 - 8.3 g/dL   Albumin 2.7 (L) 3.5 - 5.2 g/dL   GFR 48.23 (L) >60.00 mL/min   Calcium 8.4 8.4 - 10.5 mg/dL  Magnesium  Result Value Ref Range   Magnesium 1.8 1.5 - 2.5 mg/dL  Phosphorus  Result Value Ref Range   Phosphorus 3.5 2.3 - 4.6 mg/dL   Called and discussed with daughter, [name] He is currently taking potassium 20 mill equivalents daily Recommend reducing to every other day.  He is seeing nephrology a week from tomorrow, can recheck potassium at that time Low sodium is resolved, renal function improved Albumin low but better LFTs are slightly higher-Cynthia notes her dad has history of cirrhosis, he was a heavy drinker years ago but is now abstinent. They do have an appoint  with her gastroenterologist, Dr. Arelia Longest in March; will connect with him about LFTs Anemia improved

## 2021-04-21 ENCOUNTER — Encounter: Payer: Self-pay | Admitting: Family Medicine

## 2021-04-21 ENCOUNTER — Ambulatory Visit (INDEPENDENT_AMBULATORY_CARE_PROVIDER_SITE_OTHER): Payer: Medicare Other | Admitting: Family Medicine

## 2021-04-21 VITALS — BP 110/70 | HR 77 | Temp 98.4°F | Resp 20 | Ht 66.0 in | Wt 170.4 lb

## 2021-04-21 DIAGNOSIS — R54 Age-related physical debility: Secondary | ICD-10-CM

## 2021-04-21 DIAGNOSIS — N289 Disorder of kidney and ureter, unspecified: Secondary | ICD-10-CM

## 2021-04-21 DIAGNOSIS — I5023 Acute on chronic systolic (congestive) heart failure: Secondary | ICD-10-CM

## 2021-04-21 DIAGNOSIS — Z09 Encounter for follow-up examination after completed treatment for conditions other than malignant neoplasm: Secondary | ICD-10-CM

## 2021-04-21 DIAGNOSIS — N3000 Acute cystitis without hematuria: Secondary | ICD-10-CM

## 2021-04-21 DIAGNOSIS — I4819 Other persistent atrial fibrillation: Secondary | ICD-10-CM

## 2021-04-21 NOTE — Patient Instructions (Addendum)
It was good to see you again today- I will be in touch with your labs asap Keep working on your exercise with your physical therapist Continue daily weights- if any sudden weight gain of more than about 2 lbs please let me know or the heart doctor know

## 2021-04-22 DIAGNOSIS — E43 Unspecified severe protein-calorie malnutrition: Secondary | ICD-10-CM | POA: Diagnosis not present

## 2021-04-22 DIAGNOSIS — G2581 Restless legs syndrome: Secondary | ICD-10-CM | POA: Diagnosis not present

## 2021-04-22 DIAGNOSIS — M170 Bilateral primary osteoarthritis of knee: Secondary | ICD-10-CM | POA: Diagnosis not present

## 2021-04-22 DIAGNOSIS — E782 Mixed hyperlipidemia: Secondary | ICD-10-CM | POA: Diagnosis not present

## 2021-04-22 DIAGNOSIS — I5023 Acute on chronic systolic (congestive) heart failure: Secondary | ICD-10-CM | POA: Diagnosis not present

## 2021-04-22 DIAGNOSIS — M5126 Other intervertebral disc displacement, lumbar region: Secondary | ICD-10-CM | POA: Diagnosis not present

## 2021-04-22 DIAGNOSIS — I495 Sick sinus syndrome: Secondary | ICD-10-CM | POA: Diagnosis not present

## 2021-04-22 DIAGNOSIS — I2510 Atherosclerotic heart disease of native coronary artery without angina pectoris: Secondary | ICD-10-CM | POA: Diagnosis not present

## 2021-04-22 DIAGNOSIS — I700 Atherosclerosis of aorta: Secondary | ICD-10-CM | POA: Diagnosis not present

## 2021-04-22 DIAGNOSIS — M2578 Osteophyte, vertebrae: Secondary | ICD-10-CM | POA: Diagnosis not present

## 2021-04-22 DIAGNOSIS — M5127 Other intervertebral disc displacement, lumbosacral region: Secondary | ICD-10-CM | POA: Diagnosis not present

## 2021-04-22 DIAGNOSIS — I482 Chronic atrial fibrillation, unspecified: Secondary | ICD-10-CM | POA: Diagnosis not present

## 2021-04-22 DIAGNOSIS — M47816 Spondylosis without myelopathy or radiculopathy, lumbar region: Secondary | ICD-10-CM | POA: Diagnosis not present

## 2021-04-22 DIAGNOSIS — M48061 Spinal stenosis, lumbar region without neurogenic claudication: Secondary | ICD-10-CM | POA: Diagnosis not present

## 2021-04-22 DIAGNOSIS — I130 Hypertensive heart and chronic kidney disease with heart failure and stage 1 through stage 4 chronic kidney disease, or unspecified chronic kidney disease: Secondary | ICD-10-CM | POA: Diagnosis not present

## 2021-04-22 DIAGNOSIS — I255 Ischemic cardiomyopathy: Secondary | ICD-10-CM | POA: Diagnosis not present

## 2021-04-22 DIAGNOSIS — D509 Iron deficiency anemia, unspecified: Secondary | ICD-10-CM | POA: Diagnosis not present

## 2021-04-22 DIAGNOSIS — N1831 Chronic kidney disease, stage 3a: Secondary | ICD-10-CM | POA: Diagnosis not present

## 2021-04-22 DIAGNOSIS — M4326 Fusion of spine, lumbar region: Secondary | ICD-10-CM | POA: Diagnosis not present

## 2021-04-22 DIAGNOSIS — K219 Gastro-esophageal reflux disease without esophagitis: Secondary | ICD-10-CM | POA: Diagnosis not present

## 2021-04-22 DIAGNOSIS — D631 Anemia in chronic kidney disease: Secondary | ICD-10-CM | POA: Diagnosis not present

## 2021-04-22 DIAGNOSIS — J449 Chronic obstructive pulmonary disease, unspecified: Secondary | ICD-10-CM | POA: Diagnosis not present

## 2021-04-22 DIAGNOSIS — J9600 Acute respiratory failure, unspecified whether with hypoxia or hypercapnia: Secondary | ICD-10-CM | POA: Diagnosis not present

## 2021-04-22 DIAGNOSIS — E039 Hypothyroidism, unspecified: Secondary | ICD-10-CM | POA: Diagnosis not present

## 2021-04-22 DIAGNOSIS — I083 Combined rheumatic disorders of mitral, aortic and tricuspid valves: Secondary | ICD-10-CM | POA: Diagnosis not present

## 2021-04-22 LAB — CBC
HCT: 28.2 % — ABNORMAL LOW (ref 39.0–52.0)
Hemoglobin: 9.1 g/dL — ABNORMAL LOW (ref 13.0–17.0)
MCHC: 32.4 g/dL (ref 30.0–36.0)
MCV: 93.5 fl (ref 78.0–100.0)
Platelets: 426 10*3/uL — ABNORMAL HIGH (ref 150.0–400.0)
RBC: 3.01 Mil/uL — ABNORMAL LOW (ref 4.22–5.81)
RDW: 15.4 % (ref 11.5–15.5)
WBC: 7 10*3/uL (ref 4.0–10.5)

## 2021-04-22 LAB — COMPREHENSIVE METABOLIC PANEL
ALT: 94 U/L — ABNORMAL HIGH (ref 0–53)
AST: 145 U/L — ABNORMAL HIGH (ref 0–37)
Albumin: 2.7 g/dL — ABNORMAL LOW (ref 3.5–5.2)
Alkaline Phosphatase: 101 U/L (ref 39–117)
BUN: 33 mg/dL — ABNORMAL HIGH (ref 6–23)
CO2: 35 mEq/L — ABNORMAL HIGH (ref 19–32)
Calcium: 8.4 mg/dL (ref 8.4–10.5)
Chloride: 97 mEq/L (ref 96–112)
Creatinine, Ser: 1.33 mg/dL (ref 0.40–1.50)
GFR: 48.23 mL/min — ABNORMAL LOW (ref >60.00)
Glucose, Bld: 93 mg/dL (ref 70–99)
Potassium: 5.4 mEq/L — ABNORMAL HIGH (ref 3.5–5.1)
Sodium: 135 mEq/L (ref 135–145)
Total Bilirubin: 0.4 mg/dL (ref 0.2–1.2)
Total Protein: 6.6 g/dL (ref 6.0–8.3)

## 2021-04-22 LAB — PHOSPHORUS: Phosphorus: 3.5 mg/dL (ref 2.3–4.6)

## 2021-04-22 LAB — URINE CULTURE
MICRO NUMBER:: 12967974
Result:: NO GROWTH
SPECIMEN QUALITY:: ADEQUATE

## 2021-04-22 LAB — MAGNESIUM: Magnesium: 1.8 mg/dL (ref 1.5–2.5)

## 2021-04-24 DIAGNOSIS — M5127 Other intervertebral disc displacement, lumbosacral region: Secondary | ICD-10-CM | POA: Diagnosis not present

## 2021-04-24 DIAGNOSIS — M170 Bilateral primary osteoarthritis of knee: Secondary | ICD-10-CM | POA: Diagnosis not present

## 2021-04-24 DIAGNOSIS — I5023 Acute on chronic systolic (congestive) heart failure: Secondary | ICD-10-CM | POA: Diagnosis not present

## 2021-04-24 DIAGNOSIS — E43 Unspecified severe protein-calorie malnutrition: Secondary | ICD-10-CM | POA: Diagnosis not present

## 2021-04-24 DIAGNOSIS — E039 Hypothyroidism, unspecified: Secondary | ICD-10-CM | POA: Diagnosis not present

## 2021-04-24 DIAGNOSIS — I2510 Atherosclerotic heart disease of native coronary artery without angina pectoris: Secondary | ICD-10-CM | POA: Diagnosis not present

## 2021-04-24 DIAGNOSIS — M4326 Fusion of spine, lumbar region: Secondary | ICD-10-CM | POA: Diagnosis not present

## 2021-04-24 DIAGNOSIS — M47816 Spondylosis without myelopathy or radiculopathy, lumbar region: Secondary | ICD-10-CM | POA: Diagnosis not present

## 2021-04-24 DIAGNOSIS — K219 Gastro-esophageal reflux disease without esophagitis: Secondary | ICD-10-CM | POA: Diagnosis not present

## 2021-04-24 DIAGNOSIS — E782 Mixed hyperlipidemia: Secondary | ICD-10-CM | POA: Diagnosis not present

## 2021-04-24 DIAGNOSIS — M48061 Spinal stenosis, lumbar region without neurogenic claudication: Secondary | ICD-10-CM | POA: Diagnosis not present

## 2021-04-24 DIAGNOSIS — I083 Combined rheumatic disorders of mitral, aortic and tricuspid valves: Secondary | ICD-10-CM | POA: Diagnosis not present

## 2021-04-24 DIAGNOSIS — I255 Ischemic cardiomyopathy: Secondary | ICD-10-CM | POA: Diagnosis not present

## 2021-04-24 DIAGNOSIS — J9600 Acute respiratory failure, unspecified whether with hypoxia or hypercapnia: Secondary | ICD-10-CM | POA: Diagnosis not present

## 2021-04-24 DIAGNOSIS — J449 Chronic obstructive pulmonary disease, unspecified: Secondary | ICD-10-CM | POA: Diagnosis not present

## 2021-04-24 DIAGNOSIS — I495 Sick sinus syndrome: Secondary | ICD-10-CM | POA: Diagnosis not present

## 2021-04-24 DIAGNOSIS — G2581 Restless legs syndrome: Secondary | ICD-10-CM | POA: Diagnosis not present

## 2021-04-24 DIAGNOSIS — I700 Atherosclerosis of aorta: Secondary | ICD-10-CM | POA: Diagnosis not present

## 2021-04-24 DIAGNOSIS — D631 Anemia in chronic kidney disease: Secondary | ICD-10-CM | POA: Diagnosis not present

## 2021-04-24 DIAGNOSIS — I130 Hypertensive heart and chronic kidney disease with heart failure and stage 1 through stage 4 chronic kidney disease, or unspecified chronic kidney disease: Secondary | ICD-10-CM | POA: Diagnosis not present

## 2021-04-24 DIAGNOSIS — N1831 Chronic kidney disease, stage 3a: Secondary | ICD-10-CM | POA: Diagnosis not present

## 2021-04-24 DIAGNOSIS — M5126 Other intervertebral disc displacement, lumbar region: Secondary | ICD-10-CM | POA: Diagnosis not present

## 2021-04-24 DIAGNOSIS — M2578 Osteophyte, vertebrae: Secondary | ICD-10-CM | POA: Diagnosis not present

## 2021-04-24 DIAGNOSIS — D509 Iron deficiency anemia, unspecified: Secondary | ICD-10-CM | POA: Diagnosis not present

## 2021-04-24 DIAGNOSIS — I482 Chronic atrial fibrillation, unspecified: Secondary | ICD-10-CM | POA: Diagnosis not present

## 2021-04-28 ENCOUNTER — Encounter (HOSPITAL_COMMUNITY): Payer: Medicare Other

## 2021-04-29 DIAGNOSIS — G2581 Restless legs syndrome: Secondary | ICD-10-CM | POA: Diagnosis not present

## 2021-04-29 DIAGNOSIS — I482 Chronic atrial fibrillation, unspecified: Secondary | ICD-10-CM | POA: Diagnosis not present

## 2021-04-29 DIAGNOSIS — M48061 Spinal stenosis, lumbar region without neurogenic claudication: Secondary | ICD-10-CM | POA: Diagnosis not present

## 2021-04-29 DIAGNOSIS — I5023 Acute on chronic systolic (congestive) heart failure: Secondary | ICD-10-CM | POA: Diagnosis not present

## 2021-04-29 DIAGNOSIS — E782 Mixed hyperlipidemia: Secondary | ICD-10-CM | POA: Diagnosis not present

## 2021-04-29 DIAGNOSIS — I2510 Atherosclerotic heart disease of native coronary artery without angina pectoris: Secondary | ICD-10-CM | POA: Diagnosis not present

## 2021-04-29 DIAGNOSIS — J449 Chronic obstructive pulmonary disease, unspecified: Secondary | ICD-10-CM | POA: Diagnosis not present

## 2021-04-29 DIAGNOSIS — D631 Anemia in chronic kidney disease: Secondary | ICD-10-CM | POA: Diagnosis not present

## 2021-04-29 DIAGNOSIS — M5126 Other intervertebral disc displacement, lumbar region: Secondary | ICD-10-CM | POA: Diagnosis not present

## 2021-04-29 DIAGNOSIS — I495 Sick sinus syndrome: Secondary | ICD-10-CM | POA: Diagnosis not present

## 2021-04-29 DIAGNOSIS — J9600 Acute respiratory failure, unspecified whether with hypoxia or hypercapnia: Secondary | ICD-10-CM | POA: Diagnosis not present

## 2021-04-29 DIAGNOSIS — K219 Gastro-esophageal reflux disease without esophagitis: Secondary | ICD-10-CM | POA: Diagnosis not present

## 2021-04-29 DIAGNOSIS — M4326 Fusion of spine, lumbar region: Secondary | ICD-10-CM | POA: Diagnosis not present

## 2021-04-29 DIAGNOSIS — I700 Atherosclerosis of aorta: Secondary | ICD-10-CM | POA: Diagnosis not present

## 2021-04-29 DIAGNOSIS — N1831 Chronic kidney disease, stage 3a: Secondary | ICD-10-CM | POA: Diagnosis not present

## 2021-04-29 DIAGNOSIS — D509 Iron deficiency anemia, unspecified: Secondary | ICD-10-CM | POA: Diagnosis not present

## 2021-04-29 DIAGNOSIS — I255 Ischemic cardiomyopathy: Secondary | ICD-10-CM | POA: Diagnosis not present

## 2021-04-29 DIAGNOSIS — I083 Combined rheumatic disorders of mitral, aortic and tricuspid valves: Secondary | ICD-10-CM | POA: Diagnosis not present

## 2021-04-29 DIAGNOSIS — M47816 Spondylosis without myelopathy or radiculopathy, lumbar region: Secondary | ICD-10-CM | POA: Diagnosis not present

## 2021-04-29 DIAGNOSIS — M170 Bilateral primary osteoarthritis of knee: Secondary | ICD-10-CM | POA: Diagnosis not present

## 2021-04-29 DIAGNOSIS — E039 Hypothyroidism, unspecified: Secondary | ICD-10-CM | POA: Diagnosis not present

## 2021-04-29 DIAGNOSIS — M5127 Other intervertebral disc displacement, lumbosacral region: Secondary | ICD-10-CM | POA: Diagnosis not present

## 2021-04-29 DIAGNOSIS — I130 Hypertensive heart and chronic kidney disease with heart failure and stage 1 through stage 4 chronic kidney disease, or unspecified chronic kidney disease: Secondary | ICD-10-CM | POA: Diagnosis not present

## 2021-04-29 DIAGNOSIS — M2578 Osteophyte, vertebrae: Secondary | ICD-10-CM | POA: Diagnosis not present

## 2021-04-29 DIAGNOSIS — E43 Unspecified severe protein-calorie malnutrition: Secondary | ICD-10-CM | POA: Diagnosis not present

## 2021-04-30 DIAGNOSIS — N189 Chronic kidney disease, unspecified: Secondary | ICD-10-CM | POA: Diagnosis not present

## 2021-04-30 DIAGNOSIS — I129 Hypertensive chronic kidney disease with stage 1 through stage 4 chronic kidney disease, or unspecified chronic kidney disease: Secondary | ICD-10-CM | POA: Diagnosis not present

## 2021-04-30 DIAGNOSIS — N183 Chronic kidney disease, stage 3 unspecified: Secondary | ICD-10-CM | POA: Diagnosis not present

## 2021-04-30 DIAGNOSIS — D631 Anemia in chronic kidney disease: Secondary | ICD-10-CM | POA: Diagnosis not present

## 2021-04-30 DIAGNOSIS — N2581 Secondary hyperparathyroidism of renal origin: Secondary | ICD-10-CM | POA: Diagnosis not present

## 2021-05-01 DIAGNOSIS — E43 Unspecified severe protein-calorie malnutrition: Secondary | ICD-10-CM | POA: Diagnosis not present

## 2021-05-01 DIAGNOSIS — I700 Atherosclerosis of aorta: Secondary | ICD-10-CM | POA: Diagnosis not present

## 2021-05-01 DIAGNOSIS — D509 Iron deficiency anemia, unspecified: Secondary | ICD-10-CM | POA: Diagnosis not present

## 2021-05-01 DIAGNOSIS — I2510 Atherosclerotic heart disease of native coronary artery without angina pectoris: Secondary | ICD-10-CM | POA: Diagnosis not present

## 2021-05-01 DIAGNOSIS — M4326 Fusion of spine, lumbar region: Secondary | ICD-10-CM | POA: Diagnosis not present

## 2021-05-01 DIAGNOSIS — K219 Gastro-esophageal reflux disease without esophagitis: Secondary | ICD-10-CM | POA: Diagnosis not present

## 2021-05-01 DIAGNOSIS — I482 Chronic atrial fibrillation, unspecified: Secondary | ICD-10-CM | POA: Diagnosis not present

## 2021-05-01 DIAGNOSIS — M2578 Osteophyte, vertebrae: Secondary | ICD-10-CM | POA: Diagnosis not present

## 2021-05-01 DIAGNOSIS — E782 Mixed hyperlipidemia: Secondary | ICD-10-CM | POA: Diagnosis not present

## 2021-05-01 DIAGNOSIS — M47816 Spondylosis without myelopathy or radiculopathy, lumbar region: Secondary | ICD-10-CM | POA: Diagnosis not present

## 2021-05-01 DIAGNOSIS — I495 Sick sinus syndrome: Secondary | ICD-10-CM | POA: Diagnosis not present

## 2021-05-01 DIAGNOSIS — E039 Hypothyroidism, unspecified: Secondary | ICD-10-CM | POA: Diagnosis not present

## 2021-05-01 DIAGNOSIS — M5126 Other intervertebral disc displacement, lumbar region: Secondary | ICD-10-CM | POA: Diagnosis not present

## 2021-05-01 DIAGNOSIS — J449 Chronic obstructive pulmonary disease, unspecified: Secondary | ICD-10-CM | POA: Diagnosis not present

## 2021-05-01 DIAGNOSIS — I255 Ischemic cardiomyopathy: Secondary | ICD-10-CM | POA: Diagnosis not present

## 2021-05-01 DIAGNOSIS — J9600 Acute respiratory failure, unspecified whether with hypoxia or hypercapnia: Secondary | ICD-10-CM | POA: Diagnosis not present

## 2021-05-01 DIAGNOSIS — I5023 Acute on chronic systolic (congestive) heart failure: Secondary | ICD-10-CM | POA: Diagnosis not present

## 2021-05-01 DIAGNOSIS — M48061 Spinal stenosis, lumbar region without neurogenic claudication: Secondary | ICD-10-CM | POA: Diagnosis not present

## 2021-05-01 DIAGNOSIS — I130 Hypertensive heart and chronic kidney disease with heart failure and stage 1 through stage 4 chronic kidney disease, or unspecified chronic kidney disease: Secondary | ICD-10-CM | POA: Diagnosis not present

## 2021-05-01 DIAGNOSIS — I083 Combined rheumatic disorders of mitral, aortic and tricuspid valves: Secondary | ICD-10-CM | POA: Diagnosis not present

## 2021-05-01 DIAGNOSIS — N1831 Chronic kidney disease, stage 3a: Secondary | ICD-10-CM | POA: Diagnosis not present

## 2021-05-01 DIAGNOSIS — M170 Bilateral primary osteoarthritis of knee: Secondary | ICD-10-CM | POA: Diagnosis not present

## 2021-05-01 DIAGNOSIS — D631 Anemia in chronic kidney disease: Secondary | ICD-10-CM | POA: Diagnosis not present

## 2021-05-01 DIAGNOSIS — M5127 Other intervertebral disc displacement, lumbosacral region: Secondary | ICD-10-CM | POA: Diagnosis not present

## 2021-05-01 DIAGNOSIS — G2581 Restless legs syndrome: Secondary | ICD-10-CM | POA: Diagnosis not present

## 2021-05-06 DIAGNOSIS — M48061 Spinal stenosis, lumbar region without neurogenic claudication: Secondary | ICD-10-CM | POA: Diagnosis not present

## 2021-05-06 DIAGNOSIS — D631 Anemia in chronic kidney disease: Secondary | ICD-10-CM | POA: Diagnosis not present

## 2021-05-06 DIAGNOSIS — M170 Bilateral primary osteoarthritis of knee: Secondary | ICD-10-CM | POA: Diagnosis not present

## 2021-05-06 DIAGNOSIS — I130 Hypertensive heart and chronic kidney disease with heart failure and stage 1 through stage 4 chronic kidney disease, or unspecified chronic kidney disease: Secondary | ICD-10-CM | POA: Diagnosis not present

## 2021-05-06 DIAGNOSIS — J449 Chronic obstructive pulmonary disease, unspecified: Secondary | ICD-10-CM | POA: Diagnosis not present

## 2021-05-06 DIAGNOSIS — E039 Hypothyroidism, unspecified: Secondary | ICD-10-CM | POA: Diagnosis not present

## 2021-05-06 DIAGNOSIS — M5126 Other intervertebral disc displacement, lumbar region: Secondary | ICD-10-CM | POA: Diagnosis not present

## 2021-05-06 DIAGNOSIS — N1831 Chronic kidney disease, stage 3a: Secondary | ICD-10-CM | POA: Diagnosis not present

## 2021-05-06 DIAGNOSIS — I5023 Acute on chronic systolic (congestive) heart failure: Secondary | ICD-10-CM | POA: Diagnosis not present

## 2021-05-06 DIAGNOSIS — I495 Sick sinus syndrome: Secondary | ICD-10-CM | POA: Diagnosis not present

## 2021-05-06 DIAGNOSIS — M2578 Osteophyte, vertebrae: Secondary | ICD-10-CM | POA: Diagnosis not present

## 2021-05-06 DIAGNOSIS — M5127 Other intervertebral disc displacement, lumbosacral region: Secondary | ICD-10-CM | POA: Diagnosis not present

## 2021-05-06 DIAGNOSIS — G2581 Restless legs syndrome: Secondary | ICD-10-CM | POA: Diagnosis not present

## 2021-05-06 DIAGNOSIS — I083 Combined rheumatic disorders of mitral, aortic and tricuspid valves: Secondary | ICD-10-CM | POA: Diagnosis not present

## 2021-05-06 DIAGNOSIS — I482 Chronic atrial fibrillation, unspecified: Secondary | ICD-10-CM | POA: Diagnosis not present

## 2021-05-06 DIAGNOSIS — K219 Gastro-esophageal reflux disease without esophagitis: Secondary | ICD-10-CM | POA: Diagnosis not present

## 2021-05-06 DIAGNOSIS — I700 Atherosclerosis of aorta: Secondary | ICD-10-CM | POA: Diagnosis not present

## 2021-05-06 DIAGNOSIS — E782 Mixed hyperlipidemia: Secondary | ICD-10-CM | POA: Diagnosis not present

## 2021-05-06 DIAGNOSIS — D509 Iron deficiency anemia, unspecified: Secondary | ICD-10-CM | POA: Diagnosis not present

## 2021-05-06 DIAGNOSIS — I255 Ischemic cardiomyopathy: Secondary | ICD-10-CM | POA: Diagnosis not present

## 2021-05-06 DIAGNOSIS — E43 Unspecified severe protein-calorie malnutrition: Secondary | ICD-10-CM | POA: Diagnosis not present

## 2021-05-06 DIAGNOSIS — M4326 Fusion of spine, lumbar region: Secondary | ICD-10-CM | POA: Diagnosis not present

## 2021-05-06 DIAGNOSIS — M47816 Spondylosis without myelopathy or radiculopathy, lumbar region: Secondary | ICD-10-CM | POA: Diagnosis not present

## 2021-05-06 DIAGNOSIS — I2510 Atherosclerotic heart disease of native coronary artery without angina pectoris: Secondary | ICD-10-CM | POA: Diagnosis not present

## 2021-05-06 DIAGNOSIS — J9600 Acute respiratory failure, unspecified whether with hypoxia or hypercapnia: Secondary | ICD-10-CM | POA: Diagnosis not present

## 2021-05-07 ENCOUNTER — Encounter (HOSPITAL_COMMUNITY): Payer: Medicare Other

## 2021-05-08 DIAGNOSIS — E782 Mixed hyperlipidemia: Secondary | ICD-10-CM | POA: Diagnosis not present

## 2021-05-08 DIAGNOSIS — J9600 Acute respiratory failure, unspecified whether with hypoxia or hypercapnia: Secondary | ICD-10-CM | POA: Diagnosis not present

## 2021-05-08 DIAGNOSIS — I2510 Atherosclerotic heart disease of native coronary artery without angina pectoris: Secondary | ICD-10-CM | POA: Diagnosis not present

## 2021-05-08 DIAGNOSIS — E039 Hypothyroidism, unspecified: Secondary | ICD-10-CM | POA: Diagnosis not present

## 2021-05-08 DIAGNOSIS — D509 Iron deficiency anemia, unspecified: Secondary | ICD-10-CM | POA: Diagnosis not present

## 2021-05-08 DIAGNOSIS — I130 Hypertensive heart and chronic kidney disease with heart failure and stage 1 through stage 4 chronic kidney disease, or unspecified chronic kidney disease: Secondary | ICD-10-CM | POA: Diagnosis not present

## 2021-05-08 DIAGNOSIS — M5126 Other intervertebral disc displacement, lumbar region: Secondary | ICD-10-CM | POA: Diagnosis not present

## 2021-05-08 DIAGNOSIS — I5023 Acute on chronic systolic (congestive) heart failure: Secondary | ICD-10-CM | POA: Diagnosis not present

## 2021-05-08 DIAGNOSIS — M2578 Osteophyte, vertebrae: Secondary | ICD-10-CM | POA: Diagnosis not present

## 2021-05-08 DIAGNOSIS — M170 Bilateral primary osteoarthritis of knee: Secondary | ICD-10-CM | POA: Diagnosis not present

## 2021-05-08 DIAGNOSIS — I700 Atherosclerosis of aorta: Secondary | ICD-10-CM | POA: Diagnosis not present

## 2021-05-08 DIAGNOSIS — I495 Sick sinus syndrome: Secondary | ICD-10-CM | POA: Diagnosis not present

## 2021-05-08 DIAGNOSIS — I083 Combined rheumatic disorders of mitral, aortic and tricuspid valves: Secondary | ICD-10-CM | POA: Diagnosis not present

## 2021-05-08 DIAGNOSIS — D631 Anemia in chronic kidney disease: Secondary | ICD-10-CM | POA: Diagnosis not present

## 2021-05-08 DIAGNOSIS — M48061 Spinal stenosis, lumbar region without neurogenic claudication: Secondary | ICD-10-CM | POA: Diagnosis not present

## 2021-05-08 DIAGNOSIS — J449 Chronic obstructive pulmonary disease, unspecified: Secondary | ICD-10-CM | POA: Diagnosis not present

## 2021-05-08 DIAGNOSIS — M4326 Fusion of spine, lumbar region: Secondary | ICD-10-CM | POA: Diagnosis not present

## 2021-05-08 DIAGNOSIS — K219 Gastro-esophageal reflux disease without esophagitis: Secondary | ICD-10-CM | POA: Diagnosis not present

## 2021-05-08 DIAGNOSIS — M47816 Spondylosis without myelopathy or radiculopathy, lumbar region: Secondary | ICD-10-CM | POA: Diagnosis not present

## 2021-05-08 DIAGNOSIS — I482 Chronic atrial fibrillation, unspecified: Secondary | ICD-10-CM | POA: Diagnosis not present

## 2021-05-08 DIAGNOSIS — I255 Ischemic cardiomyopathy: Secondary | ICD-10-CM | POA: Diagnosis not present

## 2021-05-08 DIAGNOSIS — N1831 Chronic kidney disease, stage 3a: Secondary | ICD-10-CM | POA: Diagnosis not present

## 2021-05-08 DIAGNOSIS — M5127 Other intervertebral disc displacement, lumbosacral region: Secondary | ICD-10-CM | POA: Diagnosis not present

## 2021-05-08 DIAGNOSIS — E43 Unspecified severe protein-calorie malnutrition: Secondary | ICD-10-CM | POA: Diagnosis not present

## 2021-05-08 DIAGNOSIS — G2581 Restless legs syndrome: Secondary | ICD-10-CM | POA: Diagnosis not present

## 2021-05-13 DIAGNOSIS — M48061 Spinal stenosis, lumbar region without neurogenic claudication: Secondary | ICD-10-CM | POA: Diagnosis not present

## 2021-05-13 DIAGNOSIS — E039 Hypothyroidism, unspecified: Secondary | ICD-10-CM | POA: Diagnosis not present

## 2021-05-13 DIAGNOSIS — M170 Bilateral primary osteoarthritis of knee: Secondary | ICD-10-CM | POA: Diagnosis not present

## 2021-05-13 DIAGNOSIS — M4326 Fusion of spine, lumbar region: Secondary | ICD-10-CM | POA: Diagnosis not present

## 2021-05-13 DIAGNOSIS — I2510 Atherosclerotic heart disease of native coronary artery without angina pectoris: Secondary | ICD-10-CM | POA: Diagnosis not present

## 2021-05-13 DIAGNOSIS — I083 Combined rheumatic disorders of mitral, aortic and tricuspid valves: Secondary | ICD-10-CM | POA: Diagnosis not present

## 2021-05-13 DIAGNOSIS — M2578 Osteophyte, vertebrae: Secondary | ICD-10-CM | POA: Diagnosis not present

## 2021-05-13 DIAGNOSIS — I495 Sick sinus syndrome: Secondary | ICD-10-CM | POA: Diagnosis not present

## 2021-05-13 DIAGNOSIS — J449 Chronic obstructive pulmonary disease, unspecified: Secondary | ICD-10-CM | POA: Diagnosis not present

## 2021-05-13 DIAGNOSIS — N1831 Chronic kidney disease, stage 3a: Secondary | ICD-10-CM | POA: Diagnosis not present

## 2021-05-13 DIAGNOSIS — J9600 Acute respiratory failure, unspecified whether with hypoxia or hypercapnia: Secondary | ICD-10-CM | POA: Diagnosis not present

## 2021-05-13 DIAGNOSIS — I130 Hypertensive heart and chronic kidney disease with heart failure and stage 1 through stage 4 chronic kidney disease, or unspecified chronic kidney disease: Secondary | ICD-10-CM | POA: Diagnosis not present

## 2021-05-13 DIAGNOSIS — E43 Unspecified severe protein-calorie malnutrition: Secondary | ICD-10-CM | POA: Diagnosis not present

## 2021-05-13 DIAGNOSIS — M5126 Other intervertebral disc displacement, lumbar region: Secondary | ICD-10-CM | POA: Diagnosis not present

## 2021-05-13 DIAGNOSIS — D631 Anemia in chronic kidney disease: Secondary | ICD-10-CM | POA: Diagnosis not present

## 2021-05-13 DIAGNOSIS — I5023 Acute on chronic systolic (congestive) heart failure: Secondary | ICD-10-CM | POA: Diagnosis not present

## 2021-05-13 DIAGNOSIS — E782 Mixed hyperlipidemia: Secondary | ICD-10-CM | POA: Diagnosis not present

## 2021-05-13 DIAGNOSIS — I700 Atherosclerosis of aorta: Secondary | ICD-10-CM | POA: Diagnosis not present

## 2021-05-13 DIAGNOSIS — I482 Chronic atrial fibrillation, unspecified: Secondary | ICD-10-CM | POA: Diagnosis not present

## 2021-05-13 DIAGNOSIS — D509 Iron deficiency anemia, unspecified: Secondary | ICD-10-CM | POA: Diagnosis not present

## 2021-05-13 DIAGNOSIS — M5127 Other intervertebral disc displacement, lumbosacral region: Secondary | ICD-10-CM | POA: Diagnosis not present

## 2021-05-13 DIAGNOSIS — K219 Gastro-esophageal reflux disease without esophagitis: Secondary | ICD-10-CM | POA: Diagnosis not present

## 2021-05-13 DIAGNOSIS — M47816 Spondylosis without myelopathy or radiculopathy, lumbar region: Secondary | ICD-10-CM | POA: Diagnosis not present

## 2021-05-13 DIAGNOSIS — G2581 Restless legs syndrome: Secondary | ICD-10-CM | POA: Diagnosis not present

## 2021-05-13 DIAGNOSIS — I255 Ischemic cardiomyopathy: Secondary | ICD-10-CM | POA: Diagnosis not present

## 2021-05-15 DIAGNOSIS — I2510 Atherosclerotic heart disease of native coronary artery without angina pectoris: Secondary | ICD-10-CM | POA: Diagnosis not present

## 2021-05-15 DIAGNOSIS — I083 Combined rheumatic disorders of mitral, aortic and tricuspid valves: Secondary | ICD-10-CM | POA: Diagnosis not present

## 2021-05-15 DIAGNOSIS — M5127 Other intervertebral disc displacement, lumbosacral region: Secondary | ICD-10-CM | POA: Diagnosis not present

## 2021-05-15 DIAGNOSIS — J449 Chronic obstructive pulmonary disease, unspecified: Secondary | ICD-10-CM | POA: Diagnosis not present

## 2021-05-15 DIAGNOSIS — M4326 Fusion of spine, lumbar region: Secondary | ICD-10-CM | POA: Diagnosis not present

## 2021-05-15 DIAGNOSIS — D631 Anemia in chronic kidney disease: Secondary | ICD-10-CM | POA: Diagnosis not present

## 2021-05-15 DIAGNOSIS — I130 Hypertensive heart and chronic kidney disease with heart failure and stage 1 through stage 4 chronic kidney disease, or unspecified chronic kidney disease: Secondary | ICD-10-CM | POA: Diagnosis not present

## 2021-05-15 DIAGNOSIS — M2578 Osteophyte, vertebrae: Secondary | ICD-10-CM | POA: Diagnosis not present

## 2021-05-15 DIAGNOSIS — E782 Mixed hyperlipidemia: Secondary | ICD-10-CM | POA: Diagnosis not present

## 2021-05-15 DIAGNOSIS — I495 Sick sinus syndrome: Secondary | ICD-10-CM | POA: Diagnosis not present

## 2021-05-15 DIAGNOSIS — N1831 Chronic kidney disease, stage 3a: Secondary | ICD-10-CM | POA: Diagnosis not present

## 2021-05-15 DIAGNOSIS — M170 Bilateral primary osteoarthritis of knee: Secondary | ICD-10-CM | POA: Diagnosis not present

## 2021-05-15 DIAGNOSIS — J9600 Acute respiratory failure, unspecified whether with hypoxia or hypercapnia: Secondary | ICD-10-CM | POA: Diagnosis not present

## 2021-05-15 DIAGNOSIS — D509 Iron deficiency anemia, unspecified: Secondary | ICD-10-CM | POA: Diagnosis not present

## 2021-05-15 DIAGNOSIS — M5126 Other intervertebral disc displacement, lumbar region: Secondary | ICD-10-CM | POA: Diagnosis not present

## 2021-05-15 DIAGNOSIS — M48061 Spinal stenosis, lumbar region without neurogenic claudication: Secondary | ICD-10-CM | POA: Diagnosis not present

## 2021-05-15 DIAGNOSIS — I255 Ischemic cardiomyopathy: Secondary | ICD-10-CM | POA: Diagnosis not present

## 2021-05-15 DIAGNOSIS — G2581 Restless legs syndrome: Secondary | ICD-10-CM | POA: Diagnosis not present

## 2021-05-15 DIAGNOSIS — M47816 Spondylosis without myelopathy or radiculopathy, lumbar region: Secondary | ICD-10-CM | POA: Diagnosis not present

## 2021-05-15 DIAGNOSIS — E039 Hypothyroidism, unspecified: Secondary | ICD-10-CM | POA: Diagnosis not present

## 2021-05-15 DIAGNOSIS — K219 Gastro-esophageal reflux disease without esophagitis: Secondary | ICD-10-CM | POA: Diagnosis not present

## 2021-05-15 DIAGNOSIS — I700 Atherosclerosis of aorta: Secondary | ICD-10-CM | POA: Diagnosis not present

## 2021-05-15 DIAGNOSIS — I5023 Acute on chronic systolic (congestive) heart failure: Secondary | ICD-10-CM | POA: Diagnosis not present

## 2021-05-15 DIAGNOSIS — I482 Chronic atrial fibrillation, unspecified: Secondary | ICD-10-CM | POA: Diagnosis not present

## 2021-05-15 DIAGNOSIS — E43 Unspecified severe protein-calorie malnutrition: Secondary | ICD-10-CM | POA: Diagnosis not present

## 2021-05-21 ENCOUNTER — Ambulatory Visit: Payer: Medicare Other | Admitting: Internal Medicine

## 2021-05-21 ENCOUNTER — Other Ambulatory Visit: Payer: Self-pay

## 2021-05-21 ENCOUNTER — Other Ambulatory Visit (INDEPENDENT_AMBULATORY_CARE_PROVIDER_SITE_OTHER): Payer: Medicare Other

## 2021-05-21 ENCOUNTER — Encounter: Payer: Self-pay | Admitting: Internal Medicine

## 2021-05-21 ENCOUNTER — Ambulatory Visit: Payer: Medicare Other | Admitting: Physician Assistant

## 2021-05-21 ENCOUNTER — Encounter: Payer: Self-pay | Admitting: Physician Assistant

## 2021-05-21 VITALS — BP 98/54 | HR 70 | Ht 66.0 in | Wt 173.0 lb

## 2021-05-21 VITALS — BP 100/50 | HR 70 | Ht 66.0 in | Wt 174.8 lb

## 2021-05-21 DIAGNOSIS — Z79899 Other long term (current) drug therapy: Secondary | ICD-10-CM | POA: Diagnosis not present

## 2021-05-21 DIAGNOSIS — K222 Esophageal obstruction: Secondary | ICD-10-CM

## 2021-05-21 DIAGNOSIS — D638 Anemia in other chronic diseases classified elsewhere: Secondary | ICD-10-CM | POA: Diagnosis not present

## 2021-05-21 DIAGNOSIS — K219 Gastro-esophageal reflux disease without esophagitis: Secondary | ICD-10-CM | POA: Diagnosis not present

## 2021-05-21 DIAGNOSIS — N179 Acute kidney failure, unspecified: Secondary | ICD-10-CM

## 2021-05-21 DIAGNOSIS — R748 Abnormal levels of other serum enzymes: Secondary | ICD-10-CM

## 2021-05-21 DIAGNOSIS — E785 Hyperlipidemia, unspecified: Secondary | ICD-10-CM | POA: Diagnosis not present

## 2021-05-21 DIAGNOSIS — N280 Ischemia and infarction of kidney: Secondary | ICD-10-CM | POA: Diagnosis not present

## 2021-05-21 DIAGNOSIS — I25810 Atherosclerosis of coronary artery bypass graft(s) without angina pectoris: Secondary | ICD-10-CM

## 2021-05-21 DIAGNOSIS — I4821 Permanent atrial fibrillation: Secondary | ICD-10-CM

## 2021-05-21 DIAGNOSIS — I10 Essential (primary) hypertension: Secondary | ICD-10-CM | POA: Diagnosis not present

## 2021-05-21 LAB — COMPREHENSIVE METABOLIC PANEL
ALT: 36 U/L (ref 0–53)
AST: 43 U/L — ABNORMAL HIGH (ref 0–37)
Albumin: 3.4 g/dL — ABNORMAL LOW (ref 3.5–5.2)
Alkaline Phosphatase: 86 U/L (ref 39–117)
BUN: 41 mg/dL — ABNORMAL HIGH (ref 6–23)
CO2: 27 mEq/L (ref 19–32)
Calcium: 9 mg/dL (ref 8.4–10.5)
Chloride: 104 mEq/L (ref 96–112)
Creatinine, Ser: 1.61 mg/dL — ABNORMAL HIGH (ref 0.40–1.50)
GFR: 38.32 mL/min — ABNORMAL LOW (ref >60.00)
Glucose, Bld: 91 mg/dL (ref 70–99)
Potassium: 4.2 mEq/L (ref 3.5–5.1)
Sodium: 139 mEq/L (ref 135–145)
Total Bilirubin: 0.6 mg/dL (ref 0.2–1.2)
Total Protein: 7.3 g/dL (ref 6.0–8.3)

## 2021-05-21 MED ORDER — POTASSIUM CHLORIDE CRYS ER 10 MEQ PO TBCR: 10.0000 meq | EXTENDED_RELEASE_TABLET | ORAL | 0 refills | Status: DC

## 2021-05-21 MED ORDER — FUROSEMIDE 20 MG PO TABS: 20.0000 mg | ORAL_TABLET | ORAL | 0 refills | Status: DC

## 2021-05-21 NOTE — Patient Instructions (Signed)
Medication Instructions:  ?Decrease Lasix 20 mg every other day  ?Decrease Potassium 10 meq every other day  ? ?*If you need a refill on your cardiac medications before your next appointment, please call your pharmacy* ? ? ?Lab Work: ?CMET (in 1 week, no lab appointment needed)  ? ?If you have labs (blood work) drawn today and your tests are completely normal, you will receive your results only by: ?MyChart Message (if you have MyChart) OR ?A paper copy in the mail ?If you have any lab test that is abnormal or we need to change your treatment, we will call you to review the results. ? ? ?Follow-Up: ?At [facility], you and your health needs are our priority.  As part of our continuing mission to provide you with exceptional heart care, we have created designated Provider Care Teams.  These Care Teams include your primary Cardiologist (physician) and Advanced Practice Providers (APPs -  Physician Assistants and Nurse Practitioners) who all work together to provide you with the care you need, when you need it. ? ?We recommend signing up for the patient portal called "MyChart".  Sign up information is provided on this After Visit Summary.  MyChart is used to connect with patients for Virtual Visits (Telemedicine).  Patients are able to view lab/test results, encounter notes, upcoming appointments, etc.  Non-urgent messages can be sent to your provider as well.   ?To learn more about what you can do with MyChart, go to [email or web address].   ? ?Your next appointment:   ?3 month(s) ? ?The format for your next appointment:   ?In Person ? ?Provider:   ?Shelva Majestic, MD   ? ? ? ?

## 2021-05-21 NOTE — Patient Instructions (Signed)
Your provider has requested that you go to the basement level for lab work before leaving today. Press "B" on the elevator. The lab is located at the first door on the left as you exit the elevator. ? ?Due to recent changes in healthcare laws, you may see the results of your imaging and laboratory studies on MyChart before your provider has had a chance to review them.  We understand that in some cases there may be results that are confusing or concerning to you. Not all laboratory results come back in the same time frame and the provider may be waiting for multiple results in order to interpret others.  Please give [place] 48 hours in order for your provider to thoroughly review all the results before contacting the office for clarification of your results.  ? ?Continue your present medicine's per Dr Carlean Purl and let us know if your swallowing problems get worse. ? ? ?I appreciate the opportunity to care for you. ?Silvano Rusk, MD, [facility] ?

## 2021-05-21 NOTE — Progress Notes (Signed)
Cardiology Office Note:    Date:  05/23/2021   ID:  Gregory [name], DOB 26-Jul-1932, MRN [id number]  PCP:  Darreld Mclean, MD   [facility] Providers Cardiologist:  Shelva Majestic, MD     Referring MD: Darreld Mclean, MD   Chief Complaint  Patient presents with   Follow-up    Seen for Dr. Claiborne Billings    History of Present Illness:    Gregory [name] is a 88 y.o. 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  Last cardiac catheterization was in 2008 by Dr. Tami Ribas.  EF 20 to 25%.  The patient declined consideration for ICD.  Later EF improved to 35 to 40% by September 2012 on medical therapy.  He was hospitalized in 2013 with appendicitis with abscess requiring laparoscopic appendectomy.  In November 2014 he was found to have splenic infarct of embolic etiology.  TEE in November 2014 revealed EF 15 to 25%, diffuse hypokinesis to akinesis of the anteroseptal apical myocardium.  There was evidence of large solid fixed thrombus occupying the entire atrial appendage with moderate continuous spontaneous echo contrast in the left cavity.  Patient refused LifeVest.  He was placed on Xarelto for anticoagulation therapy.  Repeat echocardiogram in April 2015 showed EF improved to 50 to 55% without regional wall motion abnormality, mild AI, mild MR.  He had bilateral carotid artery disease which has been followed by Dr. Oneida Alar.  He has refused any potential carotid artery surgery.  I last saw the patient in July 2019, repeat echocardiogram showed EF 25 to 30% with diffuse hypokinesis.  Repeat cardiac catheterization revealed occluded native vessels with patent LIMA-LAD, patent SVG to OM 2 and sequential SVG to PDA/PLA.  Renal function is being followed by Dr. Posey Pronto of nephrology service.  Since the last  visit, patient has been admitted in January with dysphagia and underwent EGD with balloon dilatation.  Patient was admitted in late January with acute on chronic systolic heart failure.  Prior to hospitalization, he reported 2 days of worsening dyspnea and generalized weakness.  Repeat echocardiogram showed his EF was 42% with no regional wall motion abnormality.  After IV diuresis, he was eventually discharged on 40 mg daily of Lasix.  He did develop low-grade fever, however chest x-ray and UA was unremarkable.  He was treated with cefdinir for 5 days.  Patient presents today for follow-up along with his daughter.  He denies significant shortness of breath or chest discomfort.  Blood pressure is borderline low today.  Lab work this morning shows creatinine trending up to 1.6.  According to the daughter, his nephrologist Dr. Posey Pronto has reduced his Lasix down to 20 mg daily shortly after discharge.  I am concerned that 20 mg is still too high for the patient, I recommended switch the Lasix to 20 mg every other day.  As for potassium supplement, I will also reduce the potassium to 10 mEq every other day as well.  He will need 1 to 2 weeks comprehensive metabolic panel.  This morning's blood work shows liver enzyme has improved.  He can follow-up with Dr. Claiborne Billings in 3 months.   Past Medical History:  Diagnosis Date   Acute respiratory failure (HCC)    Appendicitis with abscess 03/31/2011   Arthritis    CAD (coronary artery disease),CABG 1993-LIMA to  the LAD, SVG to Om and SVG to PDA/PLA, patent on cath 2008. 2003   a. s/p CABG 2003. b. last cath 2008 with patent grafts.   Chronic atrial fibrillation (HCC)    permanent   Chronic systolic CHF (congestive heart failure) ([place])    a. Prior low EF, later normalized.   COVID 03/14/2021   Esophageal obstruction due to food impaction    Esophageal stricture    Essential hypertension    Food impaction of esophagus    GERD (gastroesophageal reflux disease)     Heart murmur    Hyperlipidemia    Ischemic cardiomyopathy    Kidney stones    "passed all but one time when he had to have lithotripsy" (01/21/2013)   Renal infarct ([place])    a. 07/2015 - after holding Xarelto x 3 days for spinal procedure.   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 felt vasovagal in setting of pain from renal infarct.    Past Surgical History:  Procedure Laterality Date   BALLOON DILATION N/A 05/22/2015   Procedure: BALLOON DILATION;  Surgeon: Gatha Mayer, MD;  Location: [facility] ENDOSCOPY;  Service: Endoscopy;  Laterality: N/A;   BALLOON DILATION N/A 12/15/2018   Procedure: BALLOON DILATION;  Surgeon: Gatha Mayer, MD;  Location: [facility];  Service: Endoscopy;  Laterality: N/A;   CARDIAC CATHETERIZATION  02/24/2002   reduced LV function, 60-70% prox RCA stenosis, 70% PLA ostial stenosis, 80% secondary branch of PLA stenosis - subsequent CABG (Dr. Jackie Plum)   Carotid Doppler  06/2012   50-69% right bulb/prox ICA diameter reduction; 70-99% left bulb/prox ICA diameter reduction   CATARACT EXTRACTION W/ INTRAOCULAR LENS IMPLANT Bilateral 2013   COLONOSCOPY N/A 05/18/2016   Procedure: COLONOSCOPY;  Surgeon: Manus Gunning, MD;  Location: [facility] ENDOSCOPY;  Service: Gastroenterology;  Laterality: N/A;   CORONARY ANGIOPLASTY  07/05/2006   3 vessel CAD, patent LIMA to LAD, patentVG to OM, patent SVG to PDA & PLA, mild MR, severe LV systolic dysfunction (Dr. Gerrie Nordmann)   CORONARY ARTERY BYPASS GRAFT  03/01/2002   LIMA to LAD, reverse SVG to OM, reverse SVG to PDA of RCA, reverse SVG to PLA of RCA, ligation of LA appendage (Dr. Servando Snare)   ENTEROSCOPY N/A 05/20/2016   Procedure: ENTEROSCOPY;  Surgeon: Manus Gunning, MD;  Location: [place];  Service: Gastroenterology;  Laterality: N/A;   ESOPHAGOGASTRODUODENOSCOPY  02/01/2012    Procedure: ESOPHAGOGASTRODUODENOSCOPY (EGD);  Surgeon: Beryle Beams, MD;  Location: [facility];  Service: Endoscopy;  Laterality: N/A;   ESOPHAGOGASTRODUODENOSCOPY N/A 05/22/2015   Procedure: ESOPHAGOGASTRODUODENOSCOPY (EGD);  Surgeon: Gatha Mayer, MD;  Location: [facility];  Service: Endoscopy;  Laterality: N/A;   ESOPHAGOGASTRODUODENOSCOPY N/A 05/17/2016   Procedure: ESOPHAGOGASTRODUODENOSCOPY (EGD);  Surgeon: Juanita Craver, MD;  Location: [facility] ENDOSCOPY;  Service: Endoscopy;  Laterality: N/A;   ESOPHAGOGASTRODUODENOSCOPY N/A 09/17/2017   Procedure: ESOPHAGOGASTRODUODENOSCOPY (EGD);  Surgeon: Milus Banister, MD;  Location: [facility] ENDOSCOPY;  Service: Endoscopy;  Laterality: N/A;   ESOPHAGOGASTRODUODENOSCOPY Left 02/09/2020   Procedure: ESOPHAGOGASTRODUODENOSCOPY (EGD);  Surgeon: Lavena Bullion, DO;  Location: [facility] ENDOSCOPY;  Service: Gastroenterology;  Laterality: Left;   ESOPHAGOGASTRODUODENOSCOPY (EGD) WITH PROPOFOL N/A 12/15/2018   Procedure: ESOPHAGOGASTRODUODENOSCOPY (EGD) WITH PROPOFOL;  Surgeon: Gatha Mayer, MD;  Location: [facility];  Service: Endoscopy;  Laterality: N/A;   ESOPHAGOGASTRODUODENOSCOPY (EGD) WITH  PROPOFOL N/A 04/07/2021   Procedure: ESOPHAGOGASTRODUODENOSCOPY (EGD) WITH PROPOFOL;  Surgeon: Milus Banister, MD;  Location: [facility];  Service: Endoscopy;  Laterality: N/A;   FOREIGN BODY REMOVAL  09/17/2017   Procedure: FOREIGN BODY REMOVAL;  Surgeon: Milus Banister, MD;  Location: [facility] ENDOSCOPY;  Service: Endoscopy;;   FOREIGN BODY REMOVAL  12/15/2018   Procedure: FOREIGN BODY REMOVAL;  Surgeon: Gatha Mayer, MD;  Location: [facility]OS[facility];  Service: Endoscopy;;   FOREIGN BODY REMOVAL  02/09/2020   Procedure: FOREIGN BODY REMOVAL;  Surgeon: Lavena Bullion, DO;  Location: [place];  Service: Gastroenterology;;   GIVENS CAPSULE STUDY N/A 05/18/2016   Procedure: GIVENS CAPSULE STUDY;  Surgeon: Manus Gunning, MD;  Location: [place];  Service:  Gastroenterology;  Laterality: N/A;   LAPAROSCOPIC APPENDECTOMY  03/30/2011   Procedure: APPENDECTOMY LAPAROSCOPIC;  Surgeon: Joyice Faster. Cornett, MD;  Location: [place];  Service: General;  Laterality: N/A;   LEFT HEART CATHETERIZATION WITH CORONARY ANGIOGRAM N/A 01/24/2013   Procedure: LEFT HEART CATHETERIZATION WITH CORONARY ANGIOGRAM;  Surgeon: [name], MD;  Location: [facility] CATH LAB;  Service: Cardiovascular;  Laterality: N/A;  Right heart with grafts   LITHOTRIPSY     "once" (01/21/2013)   [place]  05/2009   persantine myoview - fixed moderate perfusion defect in inferior wall & lateral segment of apex (poor non-transmural infarction), minimal anterolateral periinfarct reversible ischemia seen, abnormal study, defects similar to 2006 study   RIGHT/LEFT HEART CATH AND CORONARY ANGIOGRAPHY N/A 09/17/2017   Procedure: RIGHT/LEFT HEART CATH AND CORONARY ANGIOGRAPHY;  Surgeon: Martinique, Peter M, MD;  Location: [place] CV LAB;  Service: Cardiovascular;  Laterality: N/A;   SPLENECTOMY, TOTAL  01/2013   TEE WITHOUT CARDIOVERSION N/A 01/23/2013   Procedure: TRANSESOPHAGEAL ECHOCARDIOGRAM (TEE);  Surgeon: Sanda Klein, MD;  Location: [facility] ENDOSCOPY;  Service: Cardiovascular;  Laterality: N/A;   TRANSTHORACIC ECHOCARDIOGRAM  06/23/2012   EF 40-45%, mild LVH; mild AV regurg; mild MV regurg; LV mod-severely dilated; RV mildly dilated; systolic pressure borderline increased; RA mod dilated    Current Medications: Current Meds  Medication Sig   apixaban (ELIQUIS) 2.5 MG TABS tablet TAKE 1 TABLET(2.5 MG) BY MOUTH TWICE DAILY (Patient taking differently: Take 2.5 mg by mouth 2 (two) times daily.)   carvedilol (COREG) 3.125 MG tablet Take 1 tablet (3.125 mg total) by mouth 2 (two) times daily with a meal. TAKE 1 TABLET(3.125 MG) BY MOUTH TWICE DAILY WITH A MEAL (Patient taking differently: Take 3.125 mg by mouth 2 (two) times daily with a meal.)   ezetimibe (ZETIA) 10 MG tablet TAKE 1  TABLET(10 MG) BY MOUTH DAILY (Patient taking differently: Take 10 mg by mouth daily.)   gabapentin (NEURONTIN) 100 MG capsule Take 100 mg by mouth at bedtime.   levothyroxine (SYNTHROID) 25 MCG tablet TAKE 1 TABLET(25 MCG) BY MOUTH DAILY BEFORE BREAKFAST (Patient taking differently: Take 25 mcg by mouth daily before breakfast.)   melatonin 3 MG TABS tablet Take 1 tablet (3 mg total) by mouth at bedtime.   pantoprazole (PROTONIX) 40 MG tablet TAKE 1 TABLET(40 MG) BY MOUTH TWICE DAILY (Patient taking differently: Take 40 mg by mouth 2 (two) times daily.)   rOPINIRole (REQUIP) 0.25 MG tablet Take 0.25 mg by mouth 2 (two) times daily.   rosuvastatin (CRESTOR) 40 MG tablet TAKE 1 TABLET(40 MG) BY MOUTH DAILY (Patient taking differently: Take 40 mg by mouth daily.)   [DISCONTINUED] furosemide (LASIX) 40 MG tablet Take 1 tablet (  40 mg total) by mouth daily. (Patient taking differently: Take 20 mg by mouth daily.)   [DISCONTINUED] potassium chloride SA (KLOR-CON M) 20 MEQ tablet Take 1 tablet (20 mEq total) by mouth daily. (Patient taking differently: Take 20 mEq by mouth every other day.)     Allergies:   Percocet [oxycodone-acetaminophen]   Social History   Socioeconomic History   Marital status: Married    Spouse name: Evelena Peat   Number of children: 9   Years of education: 8   Highest education level: Not on file  Occupational History   Occupation: retired  Tobacco Use   Smoking status: Former    Packs/day: 2.00    Years: 20.00    Pack years: 40.00    Types: Cigarettes    Quit date: 04/08/1986    Years since quitting: 35.1   Smokeless tobacco: Current    Types: Chew   Tobacco comments:    Encouraged cessation  Vaping Use   Vaping Use: Never used  Substance and Sexual Activity   Alcohol use: No    Alcohol/week: 0.0 standard drinks   Drug use: No   Sexual activity: Not on file  Other Topics Concern   Not on file  Social History Narrative    married, 5 sons 4 daughters. He is  retired Horticulturist, commercial. 2-3 caffeinated beverages a day no alcohol   Social Determinants of Radio broadcast assistant Strain: Low Risk    Difficulty of Paying Living Expenses: Not hard at all  Food Insecurity: No Food Insecurity   Worried About Charity fundraiser in the Last Year: Never true   Ran Out of Food in the Last Year: Never true  Transportation Needs: No Transportation Needs   Lack of Transportation (Medical): No   Lack of Transportation (Non-Medical): No  Physical Activity: Not on file  Stress: Not on file  Social Connections: Not on file     Family History: The patient's family history includes Heart attack (age of onset: 75) in his brother; Heart disease in his father; Stroke in his mother; Stroke (age of onset: 36) in his brother. There is no history of Stomach cancer, Rectal cancer, Prostate cancer, Pancreatic cancer, or Ovarian cancer.  ROS:   Please see the history of present illness.     All other systems reviewed and are negative.  EKGs/Labs/Other Studies Reviewed:    The following studies were reviewed today:  Echo 04/14/2021  1. Left ventricular ejection fraction by 3D volume is 42 %. The left  ventricle has moderately decreased function. The left ventricle has no  regional wall motion abnormalities. There is mild concentric left  ventricular hypertrophy. Left ventricular  diastolic parameters are consistent with Grade I diastolic dysfunction  (impaired relaxation).   2. Right ventricular systolic function is normal. The right ventricular  size is normal.   3. Left atrial size was severely dilated.   4. The mitral valve is normal in structure. Mild mitral valve  regurgitation. No evidence of mitral stenosis.   5. The aortic valve is normal in structure. Aortic valve regurgitation is  mild. Aortic valve sclerosis is present, with no evidence of aortic valve  stenosis. Aortic regurgitation PHT measures 670 msec.   6. There is borderline dilatation of the  aortic root, measuring 37 mm.   7. The inferior vena cava is normal in size with greater than 50%  respiratory variability, suggesting right atrial pressure of 3 mmHg.    EKG:  EKG is not  ordered today.    Recent Labs: 04/08/2021: TSH 4.498 04/13/2021: B Natriuretic Peptide 489.7 04/21/2021: Hemoglobin 9.1; Magnesium 1.8; Platelets 426.0 05/21/2021: ALT 36; BUN 41; Creatinine, Ser 1.61; Potassium 4.2; Sodium 139  Recent Lipid Panel    Component Value Date/Time   CHOL 96 05/22/2020 1327   CHOL 136 05/18/2018 1355   TRIG 130.0 05/22/2020 1327   HDL 37.10 (L) 05/22/2020 1327   HDL 56 05/18/2018 1355   CHOLHDL 3 05/22/2020 1327   VLDL 26.0 05/22/2020 1327   LDLCALC 33 05/22/2020 1327   LDLCALC 67 11/22/2019 1320     Risk Assessment/Calculations:    CHA2DS2-VASc Score = 5   This indicates a 7.2% annual risk of stroke. The patient's score is based upon: CHF History: 1 HTN History: 1 Diabetes History: 0 Stroke History: 0 Vascular Disease History: 1 Age Score: 2 Gender Score: 0          Physical Exam:    VS:  BP (!) 100/50    Pulse 70    Ht '5\' 6"'$  (1.676 m)    Wt 174 lb 12.8 oz (79.3 kg)    SpO2 97%    BMI 28.21 kg/m     Wt Readings from Last 3 Encounters:  05/21/21 174 lb 12.8 oz (79.3 kg)  05/21/21 173 lb (78.5 kg)  04/21/21 170 lb 6.4 oz (77.3 kg)     GEN:  Well nourished, well developed in no acute distress HEENT: Normal NECK: No JVD; No carotid bruits LYMPHATICS: No lymphadenopathy CARDIAC: Irregularly irregular, no murmurs, rubs, gallops RESPIRATORY:  Clear to auscultation without rales, wheezing or rhonchi  ABDOMEN: Soft, non-tender, non-distended MUSCULOSKELETAL:  No edema; No deformity  SKIN: Warm and dry NEUROLOGIC:  Alert and oriented x 3 PSYCHIATRIC:  Normal affect   ASSESSMENT:    1. Permanent atrial fibrillation ([place])   2. Medication management   3. Coronary artery disease involving coronary bypass graft of native heart without angina pectoris    4. Essential hypertension   5. Hyperlipidemia LDL goal <70   6. Renal infarction ([place])   7. AKI (acute kidney injury) ([place])    PLAN:    In order of problems listed above:  Permanent atrial fibrillation: Continue Eliquis and carvedilol.  Medication management: Obtain CMP  CAD s/p CABG: Denies any recent chest pain.  Not on aspirin given the need for Eliquis  Hypertension: Blood pressure stable  Hyperlipidemia: On Zetia and Crestor  History of renal infarct: Occurred when the patient came off of blood thinner for spinal injection.  AKI: Creatinine this morning was elevated, therefore I decided to reduce the Lasix to 20 mg every other day and reduced potassium chloride to 10 mill equivalent every other day.  Repeat CMP in 1 week.            Medication Adjustments/Labs and Tests Ordered: Current medicines are reviewed at length with the patient today.  Concerns regarding medicines are outlined above.  Orders Placed This Encounter  Procedures   Comprehensive metabolic panel   Meds ordered this encounter  Medications   potassium chloride SA (KLOR-CON M) 10 MEQ tablet    Sig: Take 1 tablet (10 mEq total) by mouth every other day.    Dispense:  45 tablet    Refill:  0   furosemide (LASIX) 20 MG tablet    Sig: Take 1 tablet (20 mg total) by mouth every other day.    Dispense:  45 tablet    Refill:  0  Patient Instructions  Medication Instructions:  Decrease Lasix 20 mg every other day  Decrease Potassium 10 meq every other day   *If you need a refill on your cardiac medications before your next appointment, please call your pharmacy*   Lab Work: CMET (in 1 week, no lab appointment needed)   If you have labs (blood work) drawn today and your tests are completely normal, you will receive your results only by: [place] (if you have MyChart) OR A paper copy in the mail If you have any lab test that is abnormal or we need to change your treatment, we will  call you to review the results.   Follow-Up: At [facility], you and your health needs are our priority.  As part of our continuing mission to provide you with exceptional heart care, we have created designated Provider Care Teams.  These Care Teams include your primary Cardiologist (physician) and Advanced Practice Providers (APPs -  Physician Assistants and Nurse Practitioners) who all work together to provide you with the care you need, when you need it.  We recommend signing up for the patient portal called "MyChart".  Sign up information is provided on this After Visit Summary.  MyChart is used to connect with patients for Virtual Visits (Telemedicine).  Patients are able to view lab/test results, encounter notes, upcoming appointments, etc.  Non-urgent messages can be sent to your provider as well.   To learn more about what you can do with MyChart, go to [email or web address].    Your next appointment:   3 month(s)  The format for your next appointment:   In Person  Provider:   Shelva Majestic, MD        Signed, Almyra Deforest, [place]  05/23/2021 5:14 PM    [place]

## 2021-05-21 NOTE — Progress Notes (Signed)
Gregory [name] 88 y.o. 06-17-1932 [id number]  Assessment & Plan:   Encounter Diagnoses  Name Primary?   GERD with stricture Yes   Abnormal transaminases    Anemia of chronic disease      Dysphagia from stricture resolved.  Continue PPI pantoprazole 40 mg twice daily  Cause of abnormal transaminases not clear.  Serologic work-up negative ultrasound has suggested cirrhosis but liver was okay on a CT chest abdomen pelvis without contrast in January.  Orders Placed This Encounter  Procedures   Comprehensive metabolic panel   CK Total (and CKMB)   We will follow-up CMET and CK as part of work-up of abnormal transaminases.  Perhaps it was related to CHF, perhaps he has some sort of myositis type issue he is on statins.  Subjective:   Chief Complaint: Follow-up of dysphagia and esophageal stricture, abnormal LFTs  HPI 88 year old white man seen in January and admitted that time because of severe dysphagia.  He underwent EGD and dilation to 18 mm by Dr. Ardis Hughs after washout of anticoagulation and reports no dysphagia since.  He is continued on PPI therapy pantoprazole 40 mg twice daily.  He was also readmitted with CHF, he has abnormal transaminases and had an ultrasound that suggested cirrhosis of the liver though in January before these problems he had a CT of the chest abdomen pelvis with a normal-appearing liver though there was no IV contrast.  He had a serologic work-up with autoimmune markers in infectious hepatitis panel etc. that was negative.  He is not having any dyspnea or CHF symptoms now. No known history of liver disease. Allergies  Allergen Reactions   Percocet [Oxycodone-Acetaminophen] Itching and Nausea Only   Current Meds  Medication Sig   acetaminophen (TYLENOL) 500 MG tablet Take 500 mg by mouth every 6 (six) hours as needed for moderate pain.   apixaban (ELIQUIS) 2.5 MG TABS tablet TAKE 1 TABLET(2.5 MG) BY MOUTH TWICE DAILY (Patient taking differently:  Take 2.5 mg by mouth 2 (two) times daily.)   carvedilol (COREG) 3.125 MG tablet Take 1 tablet (3.125 mg total) by mouth 2 (two) times daily with a meal. TAKE 1 TABLET(3.125 MG) BY MOUTH TWICE DAILY WITH A MEAL (Patient taking differently: Take 3.125 mg by mouth 2 (two) times daily with a meal.)   ezetimibe (ZETIA) 10 MG tablet TAKE 1 TABLET(10 MG) BY MOUTH DAILY (Patient taking differently: Take 10 mg by mouth daily.)   furosemide (LASIX) 40 MG tablet Take 1 tablet (40 mg total) by mouth daily. (Patient taking differently: Take 20 mg by mouth daily.)   levothyroxine (SYNTHROID) 25 MCG tablet TAKE 1 TABLET(25 MCG) BY MOUTH DAILY BEFORE BREAKFAST (Patient taking differently: Take 25 mcg by mouth daily before breakfast.)   melatonin 3 MG TABS tablet Take 1 tablet (3 mg total) by mouth at bedtime.   pantoprazole (PROTONIX) 40 MG tablet TAKE 1 TABLET(40 MG) BY MOUTH TWICE DAILY (Patient taking differently: Take 40 mg by mouth 2 (two) times daily.)   potassium chloride SA (KLOR-CON M) 20 MEQ tablet Take 1 tablet (20 mEq total) by mouth daily. (Patient taking differently: Take 20 mEq by mouth every other day.)   rosuvastatin (CRESTOR) 40 MG tablet TAKE 1 TABLET(40 MG) BY MOUTH DAILY (Patient taking differently: Take 40 mg by mouth daily.)   tiZANidine (ZANAFLEX) 4 MG tablet TAKE 1 TABLET(4 MG) BY MOUTH EVERY 8 HOURS AS NEEDED FOR MUSCLE SPASMS. DO NOT COMBINE WITH TRAMADOL (Patient taking differently: Take 4 mg by  mouth every 8 (eight) hours as needed for muscle spasms.)   traMADol (ULTRAM) 50 MG tablet Take 1 tablet (50 mg total) by mouth every 6 (six) hours as needed. (Patient taking differently: Take 50 mg by mouth every 6 (six) hours as needed for moderate pain.)   Past Medical History:  Diagnosis Date   Acute respiratory failure ([place])    Appendicitis with abscess 03/31/2011   Arthritis    CAD (coronary artery disease),CABG 1993-LIMA to the LAD, SVG to Om and SVG to PDA/PLA, patent on cath 2008. 2003    a. s/p CABG 2003. b. last cath 2008 with patent grafts.   Chronic atrial fibrillation ([facility])    permanent   Chronic systolic CHF (congestive heart failure) ([place])    a. Prior low EF, later normalized.   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 07/2015 - after holding Xarelto x 3 days for spinal procedure.   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 felt vasovagal in setting of pain from renal infarct.   Past Surgical History:  Procedure Laterality Date   BALLOON DILATION N/A 05/22/2015   Procedure: BALLOON DILATION;  Surgeon: Gatha Mayer, MD;  Location: [facility] ENDOSCOPY;  Service: Endoscopy;  Laterality: N/A;   BALLOON DILATION N/A 12/15/2018   Procedure: BALLOON DILATION;  Surgeon: Gatha Mayer, MD;  Location: [facility]OS[facility];  Service: Endoscopy;  Laterality: N/A;   CARDIAC CATHETERIZATION  02/24/2002   reduced LV function, 60-70% prox RCA stenosis, 70% PLA ostial stenosis, 80% secondary branch of PLA stenosis - subsequent CABG (Dr. Jackie Plum)   Carotid Doppler  06/2012   50-69% right bulb/prox ICA diameter reduction; 70-99% left bulb/prox ICA diameter reduction   CATARACT EXTRACTION W/ INTRAOCULAR LENS IMPLANT Bilateral 2013   COLONOSCOPY N/A 05/18/2016   Procedure: COLONOSCOPY;  Surgeon: Manus Gunning, MD;  Location: [facility] ENDOSCOPY;  Service: Gastroenterology;  Laterality: N/A;   CORONARY ANGIOPLASTY  07/05/2006   3 vessel CAD, patent LIMA to LAD, patentVG to OM, patent SVG to PDA & PLA, mild MR, severe LV systolic dysfunction  (Dr. Gerrie Nordmann)   CORONARY ARTERY BYPASS GRAFT  03/01/2002   LIMA to LAD, reverse SVG to OM, reverse SVG to PDA of RCA, reverse SVG to PLA of RCA, ligation of LA appendage (Dr. Servando Snare)   ENTEROSCOPY N/A 05/20/2016   Procedure: ENTEROSCOPY;  Surgeon: Manus Gunning, MD;  Location: [place];  Service: Gastroenterology;  Laterality: N/A;   ESOPHAGOGASTRODUODENOSCOPY  02/01/2012   Procedure: ESOPHAGOGASTRODUODENOSCOPY (EGD);  Surgeon: Beryle Beams, MD;  Location: [facility];  Service: Endoscopy;  Laterality: N/A;   ESOPHAGOGASTRODUODENOSCOPY N/A 05/22/2015   Procedure: ESOPHAGOGASTRODUODENOSCOPY (EGD);  Surgeon: Gatha Mayer, MD;  Location: [facility]OS[facility];  Service: Endoscopy;  Laterality: N/A;   ESOPHAGOGASTRODUODENOSCOPY N/A 05/17/2016   Procedure: ESOPHAGOGASTRODUODENOSCOPY (EGD);  Surgeon: Juanita Craver, MD;  Location: [facility] ENDOSCOPY;  Service: Endoscopy;  Laterality: N/A;   ESOPHAGOGASTRODUODENOSCOPY N/A 09/17/2017   Procedure:  ESOPHAGOGASTRODUODENOSCOPY (EGD);  Surgeon: Milus Banister, MD;  Location: [facility] ENDOSCOPY;  Service: Endoscopy;  Laterality: N/A;   ESOPHAGOGASTRODUODENOSCOPY Left 02/09/2020   Procedure: ESOPHAGOGASTRODUODENOSCOPY (EGD);  Surgeon: Lavena Bullion, DO;  Location: [facility] ENDOSCOPY;  Service: Gastroenterology;  Laterality: Left;   ESOPHAGOGASTRODUODENOSCOPY (EGD) WITH PROPOFOL N/A 12/15/2018   Procedure: ESOPHAGOGASTRODUODENOSCOPY (EGD) WITH PROPOFOL;  Surgeon: Gatha Mayer, MD;  Location: [facility];  Service: Endoscopy;  Laterality: N/A;   ESOPHAGOGASTRODUODENOSCOPY (EGD) WITH PROPOFOL N/A 04/07/2021   Procedure: ESOPHAGOGASTRODUODENOSCOPY (EGD) WITH PROPOFOL;  Surgeon: Milus Banister, MD;  Location: [facility];  Service: Endoscopy;  Laterality: N/A;   FOREIGN BODY REMOVAL  09/17/2017   Procedure: FOREIGN BODY REMOVAL;  Surgeon: Milus Banister, MD;  Location: [facility] ENDOSCOPY;  Service: Endoscopy;;   FOREIGN BODY REMOVAL  12/15/2018   Procedure: FOREIGN BODY  REMOVAL;  Surgeon: Gatha Mayer, MD;  Location: [facility] ENDOS[facility];  Service: Endoscopy;;   FOREIGN BODY REMOVAL  02/09/2020   Procedure: FOREIGN BODY REMOVAL;  Surgeon: Lavena Bullion, DO;  Location: [place];  Service: Gastroenterology;;   GIVENS CAPSULE STUDY N/A 05/18/2016   Procedure: GIVENS CAPSULE STUDY;  Surgeon: Manus Gunning, MD;  Location: [place];  Service: Gastroenterology;  Laterality: N/A;   LAPAROSCOPIC APPENDECTOMY  03/30/2011   Procedure: APPENDECTOMY LAPAROSCOPIC;  Surgeon: Joyice Faster. Cornett, MD;  Location: [place];  Service: General;  Laterality: N/A;   LEFT HEART CATHETERIZATION WITH CORONARY ANGIOGRAM N/A 01/24/2013   Procedure: LEFT HEART CATHETERIZATION WITH CORONARY ANGIOGRAM;  Surgeon: [name], MD;  Location: [facility] CATH LAB;  Service: Cardiovascular;  Laterality: N/A;  Right heart with grafts   LITHOTRIPSY     "once" (01/21/2013)   [place]  05/2009   persantine myoview - fixed moderate perfusion defect in inferior wall & lateral segment of apex (poor non-transmural infarction), minimal anterolateral periinfarct reversible ischemia seen, abnormal study, defects similar to 2006 study   RIGHT/LEFT HEART CATH AND CORONARY ANGIOGRAPHY N/A 09/17/2017   Procedure: RIGHT/LEFT HEART CATH AND CORONARY ANGIOGRAPHY;  Surgeon: Martinique, Peter M, MD;  Location: [place] CV LAB;  Service: Cardiovascular;  Laterality: N/A;   SPLENECTOMY, TOTAL  01/2013   TEE WITHOUT CARDIOVERSION N/A 01/23/2013   Procedure: TRANSESOPHAGEAL ECHOCARDIOGRAM (TEE);  Surgeon: Sanda Klein, MD;  Location: [facility] ENDOSCOPY;  Service: Cardiovascular;  Laterality: N/A;   TRANSTHORACIC ECHOCARDIOGRAM  06/23/2012   EF 40-45%, mild LVH; mild AV regurg; mild MV regurg; LV mod-severely dilated; RV mildly dilated; systolic pressure borderline increased; RA mod dilated   Social History   Social History Narrative    married, 5 sons 4 daughters. He is retired Horticulturist, commercial. 2-3  caffeinated beverages a day no alcohol   family history includes Heart attack (age of onset: 35) in his brother; Heart disease in his father; Stroke in his mother; Stroke (age of onset: 74) in his brother.   Review of Systems  As above Objective:   Physical Exam BP (!) 98/54    Pulse 70    Ht '5\' 6"'$  (1.676 m)    Wt 173 lb (78.5 kg)    BMI 27.92 kg/m  Elderly NAD Lungs few crackles that clear Heart NL S1S2 no rmg and no JVD Abd soft NT Skin - no stigmata chronic liver disease

## 2021-05-22 LAB — CK TOTAL AND CKMB (NOT AT ARMC)
CK, MB: 1.3 ng/mL (ref 0–5.0)
Relative Index: 3.4 (ref 0–4.0)
Total CK: 38 U/L — ABNORMAL LOW (ref 44–196)

## 2021-05-23 ENCOUNTER — Encounter: Payer: Self-pay | Admitting: Physician Assistant

## 2021-06-02 DIAGNOSIS — I4821 Permanent atrial fibrillation: Secondary | ICD-10-CM | POA: Diagnosis not present

## 2021-06-02 DIAGNOSIS — Z79899 Other long term (current) drug therapy: Secondary | ICD-10-CM | POA: Diagnosis not present

## 2021-06-03 LAB — COMPREHENSIVE METABOLIC PANEL
ALT: 21 IU/L (ref 0–44)
AST: 31 IU/L (ref 0–40)
Albumin/Globulin Ratio: 1.1 — ABNORMAL LOW (ref 1.2–2.2)
Albumin: 3.5 g/dL — ABNORMAL LOW (ref 3.6–4.6)
Alkaline Phosphatase: 104 IU/L (ref 44–121)
BUN/Creatinine Ratio: 13 (ref 10–24)
BUN: 18 mg/dL (ref 8–27)
Bilirubin Total: 0.3 mg/dL (ref 0.0–1.2)
CO2: 24 mmol/L (ref 20–29)
Calcium: 8.9 mg/dL (ref 8.6–10.2)
Chloride: 105 mmol/L (ref 96–106)
Creatinine, Ser: 1.43 mg/dL — ABNORMAL HIGH (ref 0.76–1.27)
Globulin, Total: 3.3 g/dL (ref 1.5–4.5)
Glucose: 88 mg/dL (ref 70–99)
Potassium: 4.6 mmol/L (ref 3.5–5.2)
Sodium: 140 mmol/L (ref 134–144)
Total Protein: 6.8 g/dL (ref 6.0–8.5)
eGFR: 47 mL/min/{1.73_m2} — ABNORMAL LOW (ref >59)

## 2021-06-04 ENCOUNTER — Ambulatory Visit: Payer: Medicare Other | Admitting: Family Medicine

## 2021-06-06 NOTE — Progress Notes (Signed)
Stable electrolyte, renal function improving

## 2021-06-09 ENCOUNTER — Encounter: Payer: Self-pay | Admitting: *Deleted

## 2021-06-15 ENCOUNTER — Other Ambulatory Visit: Payer: Self-pay | Admitting: Cardiovascular Disease

## 2021-06-15 DIAGNOSIS — E785 Hyperlipidemia, unspecified: Secondary | ICD-10-CM

## 2021-08-01 ENCOUNTER — Other Ambulatory Visit: Payer: Self-pay | Admitting: Cardiovascular Disease

## 2021-08-01 DIAGNOSIS — I255 Ischemic cardiomyopathy: Secondary | ICD-10-CM

## 2021-08-01 NOTE — Telephone Encounter (Signed)
*  STAT* If patient is at the pharmacy, call can be transferred to refill team.   1. Which medications need to be refilled? (please list name of each medication and dose if known) carvedilol (COREG) 3.125 MG tablet potassium chloride SA (KLOR-CON M) 10 MEQ tablet furosemide (LASIX) 20 MG tablet  2. Which pharmacy/location (including street and city if local pharmacy) is medication to be sent to?[facility], [place] - [address] AT [place]  3. Do they need a 30 day or 90 day supply? 90 day

## 2021-08-12 ENCOUNTER — Other Ambulatory Visit: Payer: Self-pay | Admitting: Family Medicine

## 2021-08-12 DIAGNOSIS — G2581 Restless legs syndrome: Secondary | ICD-10-CM

## 2021-08-12 NOTE — Telephone Encounter (Signed)
Requesting:requip 0.'25mg'$  Contract:unknown PXT:[id number] Last Visit:04/21/21 Next Visit:12/10/21 Last Refill:05/14/21  Please Advise   You never prescribed this

## 2021-08-12 NOTE — Telephone Encounter (Signed)
Called daughter- he is taking this BID for restless legs Will refill for him  Meds ordered this encounter  Medications   rOPINIRole (REQUIP) 0.25 MG tablet    Sig: TAKE 1 TABLET(0.25 MG) BY MOUTH TWICE DAILY    Dispense:  180 tablet    Refill:  3

## 2021-08-13 ENCOUNTER — Other Ambulatory Visit: Payer: Self-pay | Admitting: Family Medicine

## 2021-08-13 NOTE — Telephone Encounter (Signed)
Medication:   traMADol (ULTRAM) 50 MG tablet [[id number]]   Has the patient contacted their pharmacy? No. (If no, request that the patient contact the pharmacy for the refill.) (If yes, when and what did the pharmacy advise?)  Preferred Pharmacy (with phone number or street name):   [facility] [place] - [address] AT [address]  [place], [place] [address]  Phone:  [phone]  Fax:  [phone]  Agent: Please be advised that RX refills may take up to 3 business days. We ask that you follow-up with your pharmacy.\

## 2021-09-10 ENCOUNTER — Encounter: Payer: Self-pay | Admitting: Cardiovascular Disease

## 2021-09-10 ENCOUNTER — Ambulatory Visit: Payer: Medicare Other | Admitting: Cardiovascular Disease

## 2021-09-10 DIAGNOSIS — I4821 Permanent atrial fibrillation: Secondary | ICD-10-CM

## 2021-09-10 DIAGNOSIS — I2510 Atherosclerotic heart disease of native coronary artery without angina pectoris: Secondary | ICD-10-CM | POA: Diagnosis not present

## 2021-09-10 DIAGNOSIS — I6523 Occlusion and stenosis of bilateral carotid arteries: Secondary | ICD-10-CM | POA: Diagnosis not present

## 2021-09-10 DIAGNOSIS — E785 Hyperlipidemia, unspecified: Secondary | ICD-10-CM | POA: Diagnosis not present

## 2021-09-10 DIAGNOSIS — Z951 Presence of aortocoronary bypass graft: Secondary | ICD-10-CM

## 2021-09-10 DIAGNOSIS — Z7901 Long term (current) use of anticoagulants: Secondary | ICD-10-CM

## 2021-09-10 DIAGNOSIS — E039 Hypothyroidism, unspecified: Secondary | ICD-10-CM

## 2021-09-10 DIAGNOSIS — I255 Ischemic cardiomyopathy: Secondary | ICD-10-CM

## 2021-09-10 DIAGNOSIS — I10 Essential (primary) hypertension: Secondary | ICD-10-CM

## 2021-09-10 NOTE — Progress Notes (Signed)
Patient ID: Gregory [name], male   DOB: June 20, 1932, 88 y.o.   MRN: [id number]     HPI: Gregory [name] is a 88 y.o. male who presents for a 12 month follow-up cardiology evaluation.  Gregory [name] has a history of known CAD in 2003 underwent CABG revascularization surgery by Dr. Servando Snare  (LIMA  to  LAD, vein graft to the obtuse marginal, sequential vein graft to the PDA and PLA of RCA).  His last cardiac catheterization was in 2008 done by Dr. Tami Ribas. At that time, ejection fraction was 20-25%. He declined consideration for ICD. Ejection fraction improved to approximately 35-40% in September 2012 on medical therapy. In the past, he has refused Coumadin therapy and had been maintained on aspirin and Plavix. In 2013 he was hospitalized for appendicitis with abscess requiring laparoscopic appendectomy. He was hospitalized in November 2014 and was felt to have a splenic infarct of embolic etiology. He underwent a transesophageal echocardiogram on 01/23/2013 by Dr. Sallyanne Kuster which revealed an ejection fraction in the range of 15-25%. There was diffuse hypokinesis with severe hypokinesis to akinesis of the anteroseptal apical myocardium. He had increased left ventricular end-diastolic filling pressure and elevated left atrial filling pressure. There was evidence for large solid fixed thrombus occupying the entire atrial appendage with moderate continuous spontaneous echo contrast ("smoke") in the left atrial cavity. There was no evidence for right atrial thrombus. At that time, the patient refused life vest.  He has been on Xarelto for anticoagulation.He denies chest pain. He denies significant shortness of breath. He is unaware of tachdysrhythmias.   He has permanent atrial fibrillation, but his rate has been controlled.  He denies any presyncope or syncope.  He denies any chest pain.  A followup echo Doppler study on 06/14/2013 revealed significant improvement in LV function with an ejection fraction estimated now  at 50-55% without regional wall motion abnormalities.  He did have mild aortic root dilatation, mild aortic insufficiency, and mild mitral regurgitation with biatrial enlargement.   He has severe bilateral carotid artery disease.  He had been seen by Dr. Oneida Alar.  His prior carotid duplex scan was significantly abnormal with his left carotid velocities increased to 169 systolic and 678 diastolic with severe fibrous plaque and elevated velocities within all segments of his left internal carotid.  There also was 50-69% diameter reduction in the right bulb/proximal ICA. I referred him to Dr. Gwenlyn Found who subsequently referred him to Dr. Trula Slade.  He denies chest pain.  He denies palpitations.  He denies shortness of breath.  He continues to say he is asymptomatic with reference to his carotid disease and has refused intervention.   A follow-up Doppler study on 08/23/2014 showed increased velocities in the left ar  492 over 148 at the MICA level and to 30/67, at the PICA level.  On the right, his PICA velocity was 292/78.  When compared to his prior study.  This has not increased and has remained fairly stable.  He denies chest pain or palpitations.  He denies paresthesias.  He denies visual symptoms.  When I saw him in follow-up,  he remained asymptomatic.  He has refused to consider any potential carotid intervention or surgery.  He was unaware of any fast heartbeats with his chronic atrial fibrillation.  He denies bleeding.  A carotid study earlier today continued to demonstrate severe stenosis with peak  velocity in the left MICA at 500/122 and PICA 241/34. On the right,PICA was 293/106.  At his last office visit,  I recommended discontinuing of simvastatin in attempt to be more aggressive with lipid lowering and started Crestor 40 mg daily.  He was hospitalized May 2017  after sustaining a renal infarct with acute kidney injury after taking 3 days off Xarelto for pain management injection to his back.  He had  developed mild anemia with a hemoglobin drop of 3 points leading to a subsequent emergency room evaluation.  A CT of his abdomen and pelvis did not show acute findings or signs of peritoneal bleeding.  He denied any episodes of chest pain,  paresthesias, presyncope or syncope. Or bleeding  An echo Doppler study in May 2017 showed an EF of 55-60%.  There was mild aortic insufficiency, mild aortic root dilatation, mitral annular calcification with mild MR.   On a subsequent office visit he began to consider the possibility of maybe in the future thinking about carotid surgery, but he was not ready yet to have this done.  His last carotid study in September 2017 showed left carotid peak systolic velocity of 341 and peak diastolic velocity 937, consistent with a least 80% stenosis.  On the right peak systolic velocity was 902 and diastolic velocity was 85.  He is followed by Dr. Posey Pronto for his chronic kidney disease.  When I saw him in July 2018 he was feeling well and denied any episodes of chest pain or shortness of breath.  He denied any neurologic symptoms.  He denied paresthesias or dizziness.  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 At that time he was still against consideration for carotid surgery.  He was on aggressive lipid-lowering therapy with target LDL less than 70 and potential induce plaque regression.  He was seen by Almyra Deforest, PAC in June 2019.  He was on Eliquis without bleeding he was hospitalized from July 2 through July 6 with acute decompensated heart failure complicated by acute hypoxic respiratory failure.  An echo Doppler study showed an EF of 25 to 30% with diffuse hypokinesis which had significantly decreased from 50 to 60% in 2017.  He underwent repeat catheterization which showed occlusion of his native vessels with a patent LIMA to the LAD, a patent vein graft was on 2, and subchondral vein graft to the PDA/PLA.  He was seen by Lamar Blinks in follow-up on September 29, 2017.  At that time he was feeling well and denied significant shortness of breath and was on lisinopril 5 mg, furosemide 40 mg daily in addition to carvedilol 3.125 mg twice a day.  He is anticoagulated on Eliquis 2.5 mg twice a day.  He is on rosuvastatin 40 mg.  For restless legs he also has been on Requip.   I saw him in August 2019.  At that time he was doing well and I discussed potential transition to [facility] depending upon improvement in his renal function.  He is followed by Dr. Posey Pronto for his kidney disease.  Creatinine on December 01, 2017 was 2.46.  He denies recurrent chest pain or bleeding.  He is followed by Dr. Oneida Alar since continues to have no interest in pursuing carotid revascularization.    I last saw him in March 2020 at which time he was continuing to do well and remains very active.    He sees Dr. Posey Pronto who follows his chronic kidney disease.    I saw him in September 2020 at which time he denied any episodes of dizziness, paresthesias and continued to remain active.  In  April he cut his finger on a table saw.  He works every day in the garden and the week prior to his office visit he built a fence in his yard.  He feels that he has not had any reduction in his energy level and denies any exertional shortness of breath or chest pain.  During that evaluation his blood pressure was stable on lisinopril 5 mg, carvedilol 3.125 mg twice a day, and furosemide 40 mg.  He continues to be on rosuvastatin 40 mg for aggressive lipid management and attempt to induce plaque regression.  There was no bleeding on Eliquis.  I saw him in March 2021 at which time he continued to be asymptomatic.  He was seeing Dr. Posey Pronto for renal insufficiency and creatinine on May 17, 2019 was 1.82.  Lipid studies revealed an LDL cholesterol of 83.  He denies chest pain PND orthopnea presyncope or syncope.   He was evaluated in October 2021 by Fabian Sharp, PA and remained stable.  I last saw him on August 28, 2020 at which time he continued to be extremely active.  He is cutting his grass, building porches index and driving a tractor.  He tells me when he last saw Dr. Posey Pronto his kidney function had improved.  He has been on Eliquis at reduced dose due to his age of greater than 77 and creatinine greater than 1.5.  Presently continues to be on Eliquis 2.5 mg twice a day, lisinopril 5 mg, furosemide 20 mg daily in addition to carvedilol 3.125 mg twice a day.  He denies any paresthesias or weakness.  He continues to be on Zetia 10 mg and rosuvastatin 40 mg for aggressive lipid management.  LDL cholesterol in March 2022 was excellent at 33.  Total cholesterol 96.  At that time creatinine was 1.37.    Since I saw him, he developed COVID on March 14, 2021.  He was sick for approximately a month.  He was hospitalized 29 and discharged on April 17, 2021 with acute on chronic CHF.  He was subsequently evaluated by Almyra Deforest, PA on May 21, 2021.  His creatinine had increased to 1.6.  His Lasix dose was reduced to 20 mg following his hospitalization and it was recommended by Carley Hammed that he reduce Lasix further to every other day and reduce potassium supplementation to 10 mg every other day as well.  Follow-up laboratory on June 02, 2021 showed a creatinine improved slightly at 1.43.  Potassium was 4.6  Presently, he continues to feel well.  He denies any chest pain.  He denies any significant palpitations, presyncope or syncope.  He continues to be on carvedilol 3.125 mg twice a day, furosemide 20 mg every other day with every other day 10 mill equivalent potassium.  Lipids are treated with Zetia 10 mg and rosuvastatin 40 mg.  He is on levothyroxine for mild hypothyroidism 25 mcg.  He takes gabapentin at bedtime.  Most recent LDL cholesterol was 33 He presents for evaluation.   Past Medical History:  Diagnosis Date   Acute respiratory failure ([place])    Appendicitis with abscess 03/31/2011   Arthritis    CAD  (coronary artery disease),CABG 1993-LIMA to the LAD, SVG to Om and SVG to PDA/PLA, patent on cath 2008. 2003   a. s/p CABG 2003. b. last cath 2008 with patent grafts.   Chronic atrial fibrillation (HCC)    permanent   Chronic systolic CHF (congestive heart failure) ([place])    a. Prior  low EF, later normalized.   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 07/2015 - after holding Xarelto x 3 days for spinal procedure.   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 felt vasovagal in setting of pain from renal infarct.    Past Surgical History:  Procedure Laterality Date   BALLOON DILATION N/A 05/22/2015   Procedure: BALLOON DILATION;  Surgeon: Gatha Mayer, MD;  Location: [facility] ENDOSCOPY;  Service: Endoscopy;  Laterality: N/A;   BALLOON DILATION N/A 12/15/2018   Procedure: BALLOON DILATION;  Surgeon: Gatha Mayer, MD;  Location: [facility] ENDOSCOPY;  Service: Endoscopy;  Laterality: N/A;   CARDIAC CATHETERIZATION  02/24/2002   reduced LV function, 60-70% prox RCA stenosis, 70% PLA ostial stenosis, 80% secondary branch of PLA stenosis - subsequent CABG (Dr. Jackie Plum)   Carotid Doppler  06/2012   50-69% right bulb/prox ICA diameter reduction; 70-99% left bulb/prox ICA diameter reduction   CATARACT EXTRACTION W/ INTRAOCULAR LENS IMPLANT Bilateral 2013   COLONOSCOPY N/A 05/18/2016   Procedure: COLONOSCOPY;  Surgeon: Manus Gunning, MD;  Location: [facility] ENDOSCOPY;  Service: Gastroenterology;  Laterality: N/A;   CORONARY ANGIOPLASTY  07/05/2006   3  vessel CAD, patent LIMA to LAD, patentVG to OM, patent SVG to PDA & PLA, mild MR, severe LV systolic dysfunction (Dr. Gerrie Nordmann)   CORONARY ARTERY BYPASS GRAFT  03/01/2002   LIMA to LAD, reverse SVG to OM, reverse SVG to PDA of RCA, reverse SVG to PLA of RCA, ligation of LA appendage (Dr. Servando Snare)   ENTEROSCOPY N/A 05/20/2016   Procedure: ENTEROSCOPY;  Surgeon: Manus Gunning, MD;  Location: [place];  Service: Gastroenterology;  Laterality: N/A;   ESOPHAGOGASTRODUODENOSCOPY  02/01/2012   Procedure: ESOPHAGOGASTRODUODENOSCOPY (EGD);  Surgeon: Beryle Beams, MD;  Location: [facility] END[facility];  Service: Endoscopy;  Laterality: N/A;   ESOPHAGOGASTRODUODENOSCOPY N/A 05/22/2015   Procedure: ESOPHAGOGASTRODUODENOSCOPY (EGD);  Surgeon: Gatha Mayer, MD;  Location: [facility] ENDOSCOPY;  Service: Endoscopy;  Laterality: N/A;   ESOPHAGOGASTRODUODENOSCOPY N/A 05/17/2016   Procedure: ESOPHAGOGASTRODUODENOSCOPY (EGD);  Surgeon: Juanita Craver, MD;  Location: [facility] ENDOSCOPY;  Service: Endoscopy;  Laterality: N/A;   ESOPHAGOGASTRODUODENOSCOPY N/A 09/17/2017   Procedure: ESOPHAGOGASTRODUODENOSCOPY (EGD);  Surgeon: Milus Banister, MD;  Location: [facility] ENDOSCOPY;  Service: Endoscopy;  Laterality: N/A;   ESOPHAGOGASTRODUODENOSCOPY Left 02/09/2020   Procedure: ESOPHAGOGASTRODUODENOSCOPY (EGD);  Surgeon: Lavena Bullion, DO;  Location: [facility] ENDOSCOPY;  Service: Gastroenterology;  Laterality: Left;   ESOPHAGOGASTRODUODENOSCOPY (EGD) WITH PROPOFOL N/A 12/15/2018   Procedure: ESOPHAGOGASTRODUODENOSCOPY (EGD) WITH PROPOFOL;  Surgeon: Gatha Mayer, MD;  Location: [facility] ENDOSCOPY;  Service: Endoscopy;  Laterality: N/A;   ESOPHAGOGASTRODUODENOSCOPY (EGD) WITH PROPOFOL N/A 04/07/2021   Procedure: ESOPHAGOGASTRODUODENOSCOPY (EGD) WITH PROPOFOL;  Surgeon: Milus Banister, MD;  Location: [facility] ENDOS[facility];  Service: Endoscopy;  Laterality: N/A;   FOREIGN BODY REMOVAL  09/17/2017   Procedure: FOREIGN BODY REMOVAL;  Surgeon: Milus Banister, MD;   Location: [place];  Service:  Endoscopy;;   FOREIGN BODY REMOVAL  12/15/2018   Procedure: FOREIGN BODY REMOVAL;  Surgeon: Gatha Mayer, MD;  Location: [facility] ENDOSCOPY;  Service: Endoscopy;;   FOREIGN BODY REMOVAL  02/09/2020   Procedure: FOREIGN BODY REMOVAL;  Surgeon: Lavena Bullion, DO;  Location: [place];  Service: Gastroenterology;;   GIVENS CAPSULE STUDY N/A 05/18/2016   Procedure: GIVENS CAPSULE STUDY;  Surgeon: Manus Gunning, MD;  Location: [place];  Service: Gastroenterology;  Laterality: N/A;   LAPAROSCOPIC APPENDECTOMY  03/30/2011   Procedure: APPENDECTOMY LAPAROSCOPIC;  Surgeon: Joyice Faster. Cornett, MD;  Location: [place];  Service: General;  Laterality: N/A;   LEFT HEART CATHETERIZATION WITH CORONARY ANGIOGRAM N/A 01/24/2013   Procedure: LEFT HEART CATHETERIZATION WITH CORONARY ANGIOGRAM;  Surgeon: [name], MD;  Location: [facility] CATH LAB;  Service: Cardiovascular;  Laterality: N/A;  Right heart with grafts   LITHOTRIPSY     "once" (01/21/2013)   [place]  05/2009   persantine myoview - fixed moderate perfusion defect in inferior wall & lateral segment of apex (poor non-transmural infarction), minimal anterolateral periinfarct reversible ischemia seen, abnormal study, defects similar to 2006 study   RIGHT/LEFT HEART CATH AND CORONARY ANGIOGRAPHY N/A 09/17/2017   Procedure: RIGHT/LEFT HEART CATH AND CORONARY ANGIOGRAPHY;  Surgeon: Martinique, Peter M, MD;  Location: [place] CV LAB;  Service: Cardiovascular;  Laterality: N/A;   SPLENECTOMY, TOTAL  01/2013   TEE WITHOUT CARDIOVERSION N/A 01/23/2013   Procedure: TRANSESOPHAGEAL ECHOCARDIOGRAM (TEE);  Surgeon: Sanda Klein, MD;  Location: [facility] ENDOSCOPY;  Service: Cardiovascular;  Laterality: N/A;   TRANSTHORACIC ECHOCARDIOGRAM  06/23/2012   EF 40-45%, mild LVH; mild AV regurg; mild MV regurg; LV mod-severely dilated; RV mildly dilated; systolic pressure borderline increased; RA mod dilated     Allergies  Allergen Reactions   Percocet [Oxycodone-Acetaminophen] Itching and Nausea Only    Current Outpatient Medications  Medication Sig Dispense Refill   acetaminophen (TYLENOL) 500 MG tablet Take 500 mg by mouth every 6 (six) hours as needed for moderate pain.     apixaban (ELIQUIS) 2.5 MG TABS tablet TAKE 1 TABLET(2.5 MG) BY MOUTH TWICE DAILY (Patient taking differently: Take 2.5 mg by mouth 2 (two) times daily.) 180 tablet 2   carvedilol (COREG) 3.125 MG tablet TAKE 1 TABLET(3.125 MG) BY MOUTH TWICE DAILY WITH A MEAL 180 tablet 1   ezetimibe (ZETIA) 10 MG tablet TAKE 1 TABLET(10 MG) BY MOUTH DAILY (Patient taking differently: Take 10 mg by mouth daily.) 90 tablet 1   furosemide (LASIX) 20 MG tablet TAKE 1 TABLET(20 MG) BY MOUTH EVERY OTHER DAY 45 tablet 0   gabapentin (NEURONTIN) 100 MG capsule Take 100 mg by mouth at bedtime.     levothyroxine (SYNTHROID) 25 MCG tablet TAKE 1 TABLET(25 MCG) BY MOUTH DAILY BEFORE BREAKFAST (Patient taking differently: Take 25 mcg by mouth daily before breakfast.) 90 tablet 1   melatonin 3 MG TABS tablet Take 1 tablet (3 mg total) by mouth at bedtime. 30 tablet 0   pantoprazole (PROTONIX) 40 MG tablet TAKE 1 TABLET(40 MG) BY MOUTH TWICE DAILY (Patient taking differently: Take 40 mg by mouth 2 (two) times daily.) 180 tablet 2   potassium chloride (KLOR-CON M) 10 MEQ tablet TAKE 1 TABLET(10 MEQ) BY MOUTH EVERY OTHER DAY 45 tablet 0   rOPINIRole (REQUIP) 0.25 MG tablet TAKE 1 TABLET(0.25 MG) BY MOUTH TWICE DAILY 180 tablet 3   rosuvastatin (CRESTOR) 40 MG tablet TAKE 1 TABLET(40 MG) BY MOUTH DAILY 90  tablet 1   tiZANidine (ZANAFLEX) 4 MG tablet TAKE 1 TABLET(4 MG) BY MOUTH EVERY 8 HOURS AS NEEDED FOR MUSCLE SPASMS. DO NOT COMBINE WITH TRAMADOL 60 tablet 1   traMADol (ULTRAM) 50 MG tablet TAKE 1/2 TABLET BY MOUTH EVERY 8 HOURS AS NEEDED FOR PAIN 30 tablet 1   No current facility-administered medications for this visit.    Social History    Socioeconomic History   Marital status: Married    Spouse name: Brayton Caves   Number of children: 9   Years of education: 8   Highest education level: Not on file  Occupational History   Occupation: retired  Tobacco Use   Smoking status: Former    Packs/day: 2.00    Years: 20.00    Total pack years: 40.00    Types: Cigarettes    Quit date: 04/08/1986    Years since quitting: 35.4   Smokeless tobacco: Current    Types: Chew   Tobacco comments:    Encouraged cessation  Vaping Use   Vaping Use: Never used  Substance and Sexual Activity   Alcohol use: No    Alcohol/week: 0.0 standard drinks of alcohol   Drug use: No   Sexual activity: Not on file  Other Topics Concern   Not on file  Social History Narrative    married, 5 sons 4 daughters. He is retired Scientist, water quality. 2-3 caffeinated beverages a day no alcohol   Social Determinants of Health   Financial Resource Strain: Low Risk  (04/15/2021)   Overall Financial Resource Strain (CARDIA)    Difficulty of Paying Living Expenses: Not hard at all  Food Insecurity: No Food Insecurity (04/15/2021)   Hunger Vital Sign    Worried About Running Out of Food in the Last Year: Never true    Ran Out of Food in the Last Year: Never true  Transportation Needs: No Transportation Needs (04/15/2021)   PRAPARE - Administrator, Civil Service (Medical): No    Lack of Transportation (Non-Medical): No  Physical Activity: Not on file  Stress: Not on file  Social Connections: Not on file  Intimate Partner Violence: Not on file    Family History  Problem Relation Age of Onset   Heart disease Father        rheumatic fever   Heart attack Brother 71   Stroke Mother    Stroke Brother 19   Stomach cancer Neg Hx    Rectal cancer Neg Hx    Prostate cancer Neg Hx    Pancreatic cancer Neg Hx    Ovarian cancer Neg Hx    ROS General: Negative; No fevers, chills, or night sweats;  HEENT: Negative; No changes in vision or hearing, sinus  congestion, difficulty swallowing Pulmonary: Negative; No cough, wheezing, shortness of breath, hemoptysis Cardiovascular: See history of present illness GI: positive for GERD; No nausea, vomiting, diarrhea, or abdominal pain GU: Renal infarct Musculoskeletal: Negative; no myalgias, joint pain, or weakness Hematologic/Oncology: Negative; no easy bruising, bleeding Endocrine: Negative; no heat/cold intolerance; no diabetes Neuro: positive for remote CVA Skin: Negative; No rashes or skin lesions Psychiatric: Negative; No behavioral problems, depression Sleep: Positive for restless legs; no snoring, daytime sleepiness, hypersomnolence, bruxism, , hypnogognic hallucinations, no cataplexy Other comprehensive 14 point system review is negative.   PE BP 132/70   Pulse (!) 54   Ht 5' 6.5" (1.689 m)   Wt 178 lb 6.4 oz (80.9 kg)   SpO2 97%   BMI 28.36 kg/m  Repeat blood pressure by me was 124/70  Wt Readings from Last 3 Encounters:  09/10/21 178 lb 6.4 oz (80.9 kg)  05/21/21 174 lb 12.8 oz (79.3 kg)  05/21/21 173 lb (78.5 kg)   General: Alert, oriented, no distress.  Skin: normal turgor, no rashes, warm and dry HEENT: Normocephalic, atraumatic. Pupils equal round and reactive to light; sclera anicteric; extraocular muscles intact Nose without nasal septal hypertrophy Mouth/Parynx benign; Mallinpatti scale 3 Neck: No JVD, no carotid bruits; normal carotid upstroke Lungs: clear to ausculatation and percussion; no wheezing or rales Chest wall: without tenderness to palpitation Heart: PMI not displaced, irregularly irregular with controlled ventricular response in the upper 50s to 60s RRR, s1 s2 normal, 1/6 systolic murmur, no diastolic murmur, no rubs, gallops, thrills, or heaves Abdomen: soft, nontender; no hepatosplenomehaly, BS+; abdominal aorta nontender and not dilated by palpation. Back: no CVA tenderness Pulses 2+ Musculoskeletal: full range of motion, normal strength, no joint  deformities Extremities: no clubbing cyanosis or edema, Homan's sign negative  Neurologic: grossly nonfocal; Cranial nerves grossly wnl Psychologic: Normal mood and affect  September 10, 2021   ECG (independently read by me): Atrial fibrillation at 54 bpm, left axis deviation: Prior inferior infarct with Q waves inferiorly and previous Q waves anteriorly V1-6  August 28, 2020 ECG (independently read by me):  Atrial fibrillation at 70; LAD, Inferior Q waves, QS V1-20 May 2019 ECG (independently read by me): Atrial fibrillation at 72 bpm, isolated PVC.  QRS voltage.  Inferior Q waves.  QS complex V1 through V6.  September 2020 ECG (independently read by me): Atrial fibrillation with ventricular rate at 50 bpm.  QS complex V1 through V4  March 2020 ECG (independently read by me): Atrial fibrillation at 66 bpm.  Right axis deviation.  Poor anterior R wave progression V1 through V5.  DC interval for 36 ms.  January 13, 2018 ECG (independently read by me): Atrial fibrillation at 65 bpm.  Inferior Q waves.  Poor anterior R wave progression V1 through V4  August 2019 ECG (independently read by me): AF vs junctional at 70; old anterolateral MI; QTc 497 msec  July 2018 ECG (independently read by me): Atrial fibrillation at 60 bpm.  Low voltage.  Probable old anterolateral MI with poor anterior R-wave progression.  Small inferior Q waves.  December 2017 ECG (independently read by me): Atrial fibrillation with a rate at 58 bpm.  QS complex anteriorly.  Low voltage.  September 2017 ECG (independently read by me): Atrial fibrillation with premature ventricular beats.  Poor R wave progression anteriorly.  Small nondiagnostic inferior Q waves.  December 2016 ECG (independently read by me): atrial fibrillation with rate in the 60s with occasional PVC.  QTc interval 452 ms  August 2016 ECG (independently read by me): Atrial fibrillation at 67 bpm.  2 ventricular premature beats.  Poor R-wave progression  May  2016 ECG (independently read by me): Atrial fibrillation with occasional PVCs.  Heart rate 72.  ECG (independently read by me): Atrial fibrillation with controlled ventricular response in the 70s.  Poor anterior R-wave progression.  Prior April 2015 ECG (independently read by me): Atrial fibrillation at 56 beats per minute.  QTc interval 395 ms  Prior 02/23/2013 ECG: Atrial fibrillation at 80 beats per minute with isolated PVC.  LABS:    Latest Ref Rng & Units 06/02/2021    2:49 PM 05/21/2021    9:04 AM 04/21/2021    2:28 PM  BMP  Glucose 70 - 99  mg/dL 88  91  93   BUN 8 - 27 mg/dL 18  41  33   Creatinine 0.76 - 1.27 mg/dL 1.43  1.61  1.33   BUN/Creat Ratio 10 - 24 13     Sodium 134 - 144 mmol/L 140  139  135   Potassium 3.5 - 5.2 mmol/L 4.6  4.2  5.4   Chloride 96 - 106 mmol/L 105  104  97   CO2 20 - 29 mmol/L 24  27  35   Calcium 8.6 - 10.2 mg/dL 8.9  9.0  8.4       Latest Ref Rng & Units 06/02/2021    2:49 PM 05/21/2021    9:04 AM 04/21/2021    2:28 PM  Hepatic Function  Total Protein 6.0 - 8.5 g/dL 6.8  7.3  6.6   Albumin 3.6 - 4.6 g/dL 3.5  3.4  2.7   AST 0 - 40 IU/L 31  43  145   ALT 0 - 44 IU/L 21  36  94   Alk Phosphatase 44 - 121 IU/L 104  86  101   Total Bilirubin 0.0 - 1.2 mg/dL 0.3  0.6  0.4       Latest Ref Rng & Units 04/21/2021    2:28 PM 04/17/2021    2:29 AM 04/16/2021    6:30 AM  CBC  WBC 4.0 - 10.5 K/uL 7.0  10.8  8.5   Hemoglobin 13.0 - 17.0 g/dL 9.1  8.2  8.2   Hematocrit 39.0 - 52.0 % 28.2  24.9  26.1   Platelets 150.0 - 400.0 K/uL 426.0  278  283    Lab Results  Component Value Date   MCV 93.5 04/21/2021   MCV 94.0 04/17/2021   MCV 94.9 04/16/2021   Lab Results  Component Value Date   TSH 4.498 04/08/2021   Lab Results  Component Value Date   HGBA1C 6.3 12/05/2020   Lipid Panel     Component Value Date/Time   CHOL 96 05/22/2020 1327   CHOL 136 05/18/2018 1355   TRIG 130.0 05/22/2020 1327   HDL 37.10 (L) 05/22/2020 1327   HDL 56 05/18/2018  1355   CHOLHDL 3 05/22/2020 1327   VLDL 26.0 05/22/2020 1327   LDLCALC 33 05/22/2020 1327   LDLCALC 67 11/22/2019 1320     IMPRESSION:  1. Permanent atrial fibrillation ([place])   2. Essential hypertension   3. Coronary artery disease involving native coronary artery of native heart without angina pectoris   4. Hx of CABG   5. Cardiomyopathy, ischemic, improved   6. Chronic anticoagulation   7. Bilateral carotid artery stenosis   8. Acquired hypothyroidism   9. Hyperlipidemia with target LDL less than 70     ASSESSMENT AND PLAN: Mr. Laine Giovanetti is an 88 year old gentleman who is status post CABG revascularization surgery in 2003. He has a history of an ischemic cardiomyopathy with an initial ejection fraction of 25% with subsequent normalization at 55-60%; however, on his echo Doppler study during his July 2019 hospitalization EF was again 25 to 30%.  There was mild AR, mild to moderate MR, biatrial enlargement and mild TR.  He had akinesis of the distal anteroseptal wall and apex.  He has a remote history of a small right brain CVA in 2005  with subsequent recovery. 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  When I last saw him he was on a reduced dose of Eliquis in light of his age greater than 54 and renal dysfunction.  He has documented high-grade carotid disease and was followed by Dr. Oneida Alar but had no interest in pursuing surgery or stenting.  Presently, he continues to be fairly active.  Since his last evaluation he has been taken off lisinopril and is now on a regimen of carvedilol 3.125 mg twice a day and Lasix 20 mg every other day with 10 mEq of potassium on the days he takes Lasix.  He continues to be on aggressive lipid-lowering therapy with Zetia and rosuvastatin 40 mg.  He is followed by Dr. Posey Pronto his nephrologist.   Renal function is slightly improved from 1.63 down to 1.43.  He continues to be on Eliquis and his dose had been adjusted when his renal function deteriorated down to 2.5 twice daily.  Clinically he is stable and remains fairly asymptomatic.  He will be seeing his primary provider Charise Carwin in September 2023 and laboratory will be obtained.  I have recommended he see how Meng, PA in 6 months and me in 1 year for follow-up Cardiologic evaluation.   Troy Sine, MD, [facility]  09/10/2021 2:45 PM

## 2021-09-10 NOTE — Patient Instructions (Signed)

## 2021-10-08 ENCOUNTER — Other Ambulatory Visit: Payer: Self-pay | Admitting: Cardiovascular Disease

## 2021-10-09 DIAGNOSIS — M48061 Spinal stenosis, lumbar region without neurogenic claudication: Secondary | ICD-10-CM | POA: Diagnosis not present

## 2021-10-14 ENCOUNTER — Other Ambulatory Visit: Payer: Self-pay

## 2021-10-14 ENCOUNTER — Encounter (HOSPITAL_COMMUNITY): Payer: Self-pay

## 2021-10-14 ENCOUNTER — Encounter (HOSPITAL_COMMUNITY)
Admission: EM | Disposition: A | Discharge status: Home / Self Care | Payer: Self-pay | Source: Home / Self Care | Attending: Emergency Medicine

## 2021-10-14 ENCOUNTER — Ambulatory Visit (HOSPITAL_COMMUNITY)
Admission: EM | Admit: 2021-10-14 | Discharge: 2021-10-14 | Disposition: A | Discharge status: Home / Self Care | Payer: Medicare Other | Attending: Emergency Medicine | Admitting: Emergency Medicine

## 2021-10-14 ENCOUNTER — Emergency Department (EMERGENCY_DEPARTMENT_HOSPITAL): Payer: Medicare Other | Admitting: Certified Registered Nurse Anesthetist

## 2021-10-14 ENCOUNTER — Telehealth: Payer: Self-pay | Admitting: Student

## 2021-10-14 ENCOUNTER — Emergency Department (HOSPITAL_COMMUNITY): Payer: Medicare Other | Admitting: Certified Registered Nurse Anesthetist

## 2021-10-14 ENCOUNTER — Telehealth: Payer: Self-pay | Admitting: Internal Medicine

## 2021-10-14 DIAGNOSIS — I2510 Atherosclerotic heart disease of native coronary artery without angina pectoris: Secondary | ICD-10-CM | POA: Diagnosis not present

## 2021-10-14 DIAGNOSIS — T18128A Food in esophagus causing other injury, initial encounter: Secondary | ICD-10-CM

## 2021-10-14 DIAGNOSIS — K449 Diaphragmatic hernia without obstruction or gangrene: Secondary | ICD-10-CM | POA: Diagnosis not present

## 2021-10-14 DIAGNOSIS — I509 Heart failure, unspecified: Secondary | ICD-10-CM | POA: Diagnosis not present

## 2021-10-14 DIAGNOSIS — I4891 Unspecified atrial fibrillation: Secondary | ICD-10-CM | POA: Insufficient documentation

## 2021-10-14 DIAGNOSIS — E039 Hypothyroidism, unspecified: Secondary | ICD-10-CM | POA: Diagnosis not present

## 2021-10-14 DIAGNOSIS — Z7901 Long term (current) use of anticoagulants: Secondary | ICD-10-CM | POA: Diagnosis not present

## 2021-10-14 DIAGNOSIS — X58XXXA Exposure to other specified factors, initial encounter: Secondary | ICD-10-CM | POA: Diagnosis not present

## 2021-10-14 DIAGNOSIS — I255 Ischemic cardiomyopathy: Secondary | ICD-10-CM | POA: Insufficient documentation

## 2021-10-14 DIAGNOSIS — Z8673 Personal history of transient ischemic attack (TIA), and cerebral infarction without residual deficits: Secondary | ICD-10-CM | POA: Diagnosis not present

## 2021-10-14 DIAGNOSIS — K222 Esophageal obstruction: Secondary | ICD-10-CM | POA: Diagnosis not present

## 2021-10-14 DIAGNOSIS — I110 Hypertensive heart disease with heart failure: Secondary | ICD-10-CM | POA: Insufficient documentation

## 2021-10-14 DIAGNOSIS — Z79899 Other long term (current) drug therapy: Secondary | ICD-10-CM | POA: Insufficient documentation

## 2021-10-14 DIAGNOSIS — Q399 Congenital malformation of esophagus, unspecified: Secondary | ICD-10-CM | POA: Diagnosis not present

## 2021-10-14 DIAGNOSIS — K219 Gastro-esophageal reflux disease without esophagitis: Secondary | ICD-10-CM | POA: Diagnosis not present

## 2021-10-14 DIAGNOSIS — T18108A Unspecified foreign body in esophagus causing other injury, initial encounter: Secondary | ICD-10-CM | POA: Diagnosis not present

## 2021-10-14 DIAGNOSIS — I5022 Chronic systolic (congestive) heart failure: Secondary | ICD-10-CM | POA: Diagnosis not present

## 2021-10-14 DIAGNOSIS — Z87891 Personal history of nicotine dependence: Secondary | ICD-10-CM | POA: Diagnosis not present

## 2021-10-14 DIAGNOSIS — Z951 Presence of aortocoronary bypass graft: Secondary | ICD-10-CM | POA: Diagnosis not present

## 2021-10-14 HISTORY — PX: BALLOON DILATION: SHX5330

## 2021-10-14 HISTORY — PX: ESOPHAGOGASTRODUODENOSCOPY (EGD) WITH PROPOFOL: SHX5813

## 2021-10-14 HISTORY — PX: FOREIGN BODY REMOVAL: SHX962

## 2021-10-14 LAB — COMPREHENSIVE METABOLIC PANEL
ALT: 34 U/L (ref 0–44)
AST: 32 U/L (ref 15–41)
Albumin: 3.8 g/dL (ref 3.5–5.0)
Alkaline Phosphatase: 66 U/L (ref 38–126)
Anion gap: 8 (ref 5–15)
BUN: 41 mg/dL — ABNORMAL HIGH (ref 8–23)
CO2: 23 mmol/L (ref 22–32)
Calcium: 9.4 mg/dL (ref 8.9–10.3)
Chloride: 113 mmol/L — ABNORMAL HIGH (ref 98–111)
Creatinine, Ser: 1.71 mg/dL — ABNORMAL HIGH (ref 0.61–1.24)
GFR, Estimated: 38 mL/min — ABNORMAL LOW (ref >60)
Glucose, Bld: 88 mg/dL (ref 70–99)
Potassium: 4.5 mmol/L (ref 3.5–5.1)
Sodium: 144 mmol/L (ref 135–145)
Total Bilirubin: 1 mg/dL (ref 0.3–1.2)
Total Protein: 7.7 g/dL (ref 6.5–8.1)

## 2021-10-14 LAB — CBC
HCT: 39.7 % (ref 39.0–52.0)
Hemoglobin: 12.9 g/dL — ABNORMAL LOW (ref 13.0–17.0)
MCH: 31.5 pg (ref 26.0–34.0)
MCHC: 32.5 g/dL (ref 30.0–36.0)
MCV: 96.8 fL (ref 80.0–100.0)
Platelets: 165 10*3/uL (ref 150–400)
RBC: 4.1 MIL/uL — ABNORMAL LOW (ref 4.22–5.81)
RDW: 15.1 % (ref 11.5–15.5)
WBC: 9.8 10*3/uL (ref 4.0–10.5)
nRBC: 0 % (ref 0.0–0.2)

## 2021-10-14 LAB — PROTIME-INR
INR: 1.2 (ref 0.8–1.2)
Prothrombin Time: 14.7 seconds (ref 11.4–15.2)

## 2021-10-14 SURGERY — ESOPHAGOGASTRODUODENOSCOPY (EGD) WITH PROPOFOL
Anesthesia: Monitor Anesthesia Care

## 2021-10-14 MED ORDER — PROPOFOL 10 MG/ML IV BOLUS
INTRAVENOUS | Status: DC | PRN
  Administered 2021-10-14: 40 mg via INTRAVENOUS
  Administered 2021-10-14: 100 mg via INTRAVENOUS

## 2021-10-14 MED ORDER — PROPOFOL 500 MG/50ML IV EMUL
INTRAVENOUS | Status: AC
  Filled 2021-10-14: qty 50

## 2021-10-14 MED ORDER — DEXAMETHASONE SODIUM PHOSPHATE 10 MG/ML IJ SOLN
INTRAMUSCULAR | Status: DC | PRN
  Administered 2021-10-14: 5 mg via INTRAVENOUS

## 2021-10-14 MED ORDER — ONDANSETRON HCL 4 MG/2ML IJ SOLN
INTRAMUSCULAR | Status: DC | PRN
  Administered 2021-10-14: 4 mg via INTRAVENOUS

## 2021-10-14 MED ORDER — PANTOPRAZOLE SODIUM 40 MG IV SOLR: 40.0000 mg | Freq: Once | INTRAVENOUS | Status: DC

## 2021-10-14 MED ORDER — LACTATED RINGERS IV SOLN
INTRAVENOUS | Status: AC | PRN
  Administered 2021-10-14: 20 mL/h via INTRAVENOUS

## 2021-10-14 MED ORDER — EPHEDRINE SULFATE (PRESSORS) 50 MG/ML IJ SOLN
INTRAMUSCULAR | Status: DC | PRN
  Administered 2021-10-14: 5 mg via INTRAVENOUS

## 2021-10-14 MED ORDER — LIDOCAINE HCL (CARDIAC) PF 100 MG/5ML IV SOSY
PREFILLED_SYRINGE | INTRAVENOUS | Status: DC | PRN
  Administered 2021-10-14: 100 mg via INTRAVENOUS

## 2021-10-14 MED ORDER — SUCCINYLCHOLINE CHLORIDE 200 MG/10ML IV SOSY
PREFILLED_SYRINGE | INTRAVENOUS | Status: DC | PRN
  Administered 2021-10-14: 80 mg via INTRAVENOUS

## 2021-10-14 MED ORDER — ESMOLOL HCL 100 MG/10ML IV SOLN
INTRAVENOUS | Status: DC | PRN
  Administered 2021-10-14: 20 mg via INTRAVENOUS

## 2021-10-14 MED ORDER — SODIUM CHLORIDE 0.9 % IV SOLN
INTRAVENOUS | Status: DC
Start: 2021-10-14 — End: 2021-10-15

## 2021-10-14 MED ORDER — PHENYLEPHRINE HCL (PRESSORS) 10 MG/ML IV SOLN
INTRAVENOUS | Status: DC | PRN
  Administered 2021-10-14: 80 ug via INTRAVENOUS
  Administered 2021-10-14: 160 ug via INTRAVENOUS

## 2021-10-14 SURGICAL SUPPLY — 15 items

## 2021-10-14 NOTE — Telephone Encounter (Signed)
Hi Remo Lipps, I did not see this phone message until now.  Please note the hospital service has been extremely busy today and nonstop.  Please page me or send me a secure chat message with any hospital consult.  I truly appreciate it thank you so much for everything you do.

## 2021-10-14 NOTE — Telephone Encounter (Signed)
Left Message for pt daughter to call back:  Chart reviewed. Pt has Currently been triaged per ED notes:

## 2021-10-14 NOTE — Telephone Encounter (Signed)
Patient's daughter called to give you a head's up about patient.  She said he has something stuck in his throat, can't swallow water, and has not been able to take his pills today.  She is on her way to pick him up and take him to [facility] ER--just wanted to make you aware.  Thank you.

## 2021-10-14 NOTE — Anesthesia Preprocedure Evaluation (Addendum)
Anesthesia Evaluation  Patient identified by MRN, date of birth, ID band Patient awake    Reviewed: Allergy & Precautions, NPO status , Patient's Chart, lab work & pertinent test results  Airway Mallampati: II  TM Distance: >3 FB Neck ROM: Full    Dental  (+) Lower Dentures, Upper Dentures   Pulmonary former smoker,    Pulmonary exam normal        Cardiovascular hypertension, Pt. on home beta blockers + CAD, + Cardiac Stents, + CABG, + Peripheral Vascular Disease and +CHF  Normal cardiovascular exam+ dysrhythmias Atrial Fibrillation + Valvular Problems/Murmurs   ECHO: Left ventricular ejection fraction by 3D volume is 42 %. The left ventricle has moderately decreased function. The left ventricle has no regional wall motion abnormalities. There is mild concentric left ventricular hypertrophy. Left ventricular diastolic parameters are consistent with Grade I diastolic dysfunction (impaired relaxation). 1. 2. Right ventricular systolic function is normal. The right ventricular size is normal. 3. Left atrial size was severely dilated. The mitral valve is normal in structure. Mild mitral valve regurgitation. No evidence of mitral stenosis. 4. The aortic valve is normal in structure. Aortic valve regurgitation is mild. Aortic valve sclerosis is present, with no evidence of aortic valve stenosis. Aortic regurgitation PHT measures 670 msec. There is borderline dilatation of the aortic root, measuring 37 mm. The inferior vena cava is normal in size with greater than 50% respiratory variability, suggesting right atrial pressure of 3 mmHg.   Neuro/Psych CVA, No Residual Symptoms negative psych ROS   GI/Hepatic Neg liver ROS, GERD  Medicated and Controlled,  Endo/Other  Hypothyroidism   Renal/GU Renal InsufficiencyRenal disease     Musculoskeletal  (+) Arthritis ,   Abdominal   Peds  Hematology  (+) Blood dyscrasia, ,    Anesthesia Other Findings Food bolus impaction  Reproductive/Obstetrics                             Anesthesia Physical Anesthesia Plan  ASA: 3 and emergent  Anesthesia Plan: General   Post-op Pain Management:    Induction: Intravenous  PONV Risk Score and Plan: 2 and Treatment may vary due to age or medical condition, Ondansetron and Dexamethasone  Airway Management Planned: Oral ETT  Additional Equipment:   Intra-op Plan:   Post-operative Plan: Extubation in OR  Informed Consent: I have reviewed the patients History and Physical, chart, labs and discussed the procedure including the risks, benefits and alternatives for the proposed anesthesia with the patient or authorized representative who has indicated his/her understanding and acceptance.     Dental advisory given  Plan Discussed with: CRNA and Surgeon  Anesthesia Plan Comments:        Anesthesia Quick Evaluation

## 2021-10-14 NOTE — Telephone Encounter (Signed)
Known to me with history of GERD and stricture and prior food impaction.   FYI  Maybe you can reach out to the ED to expedite endoscopy, if necessary.

## 2021-10-14 NOTE — Telephone Encounter (Signed)
Fielded a call from Mr. Raborn's daughter this evening.  He had an urgent EGD today and was discharged after removal of a food impaction.  She mentioned bleeding.  I did not see any mentioned in the endoscopy report but blood is noted on the endoscopy images.  Was told by gastroenterologist to hold anticoagulant until Thursday evening.  She reports that when he had held anticoagulants for >24 hr previously that he has had two separate episodes of splenic and renal emboli.  He is on eliquis for nonvalvular atrial fibrillation.  He was instructed to check with [place] if any questions.  Of note, I have never met the patient.   CHA2DS2-VASc Score = 5   This indicates a 7.2% annual risk of stroke. The patient's score is based upon: CHF History: 1 HTN History: 1 Diabetes History: 0 Stroke History: 0 Vascular Disease History: 1 Age Score: 2 Gender Score: 0     He has quite high C2V risk and is in permanent AF with low EF.  Tricky situation, but I would recommend resuming eliquis tomorrow evening en light of the prior history and high stroke risk with minor bleeding at the endoscopy.  He will be at least 36 hours from anticoagulant at that point (took one this morning but had an impaction at that point so unclear absorption - perhaps even longer).  Strict precautions regarding bleeding symptoms given.  Given prohibitive stroke risk, would consider anticoagulant bridge for future procedures.  Delton Prairie, MD Cardiology Fellow [facility]  [place] Primary cardiologist in the event that he has alternative recommendations for the patient.

## 2021-10-14 NOTE — ED Provider Triage Note (Addendum)
Emergency Medicine Provider Triage Evaluation Note  Gregory [name] , a 88 y.o. male  was evaluated in triage.  Pt complains of sensation of foreign body stuck in his throat.  Daughter is at bedside.  States he has been having this for about 3 weeks however recently this worsened.  Patient has steak last night.  States since then he has not been able to drink liquids or take his pills today.  He does appear comfortable on exam in triage.  Daughter states gastroenterology is aware.  Patient has history of esophageal strictures. Last dose of eliquis last night.   Review of Systems  Positive: As above Negative: As above  Physical Exam  BP (!) 129/47 (BP Location: Right Arm)   Pulse 88   Temp 98.2 F (36.8 C) (Oral)   Resp 18   SpO2 92%  Gen:   Awake, no distress   Resp:  Normal effort  MSK:   Moves extremities without difficulty  Other:    Medical Decision Making  Medically screening exam initiated at 2:00 PM.  Appropriate orders placed.  Gregory [name] was informed that the remainder of the evaluation will be completed by another provider, this initial triage assessment does not replace that evaluation, and the importance of remaining in the ED until their evaluation is complete.     [name], PA-C 10/14/21 [place], [place], PA-C 10/14/21 1404

## 2021-10-14 NOTE — Discharge Instructions (Addendum)
YOU HAD AN ENDOSCOPIC PROCEDURE TODAY: Refer to the procedure report and other information in the discharge instructions given to you for any specific questions about what was found during the examination. If this information does not answer your questions, please call [place] office at [phone] to clarify.   YOU SHOULD EXPECT: Some feelings of bloating in the abdomen. Passage of more gas than usual. Walking can help get rid of the air that was put into your GI tract during the procedure and reduce the bloating. If you had a lower endoscopy (such as a colonoscopy or flexible sigmoidoscopy) you may notice spotting of blood in your stool or on the toilet paper. Some abdominal soreness may be present for a day or two, also.  DIET: Your first meal following the procedure should be a light meal and then it is ok to progress to your normal diet. A half-sandwich or bowl of soup is an example of a good first meal. Heavy or fried foods are harder to digest and may make you feel nauseous or bloated. Drink plenty of fluids but you should avoid alcoholic beverages for 24 hours. If you had a esophageal dilation, please see attached instructions for diet.    ACTIVITY: Your care partner should take you home directly after the procedure. You should plan to take it easy, moving slowly for the rest of the day. You can resume normal activity the day after the procedure however YOU SHOULD NOT DRIVE, use power tools, machinery or perform tasks that involve climbing or major physical exertion for 24 hours (because of the sedation medicines used during the test).   SYMPTOMS TO REPORT IMMEDIATELY: A gastroenterologist can be reached at any hour. Please call [phone]  for any of the following symptoms:  Following upper endoscopy (EGD, EUS, ERCP, esophageal dilation) Vomiting of blood or coffee ground material  New, significant abdominal pain  New, significant chest pain or pain under the shoulder blades  Painful or  persistently difficult swallowing  New shortness of breath  Black, tarry-looking or red, bloody stools  RESTART ELIQUIS THURSDAY AM  FOLLOW UP:  If any biopsies were taken you will be contacted by phone or by letter within the next 1-3 weeks. Call [phone]  if you have not heard about the biopsies in 3 weeks.  Please also call with any specific questions about appointments or follow up tests.

## 2021-10-14 NOTE — Anesthesia Procedure Notes (Signed)
Procedure Name: Intubation Date/Time: 10/14/2021 7:10 PM  Performed by: Raenette Rover, CRNAPre-anesthesia Checklist: Patient identified, Emergency Drugs available, Suction available and Patient being monitored Patient Re-evaluated:Patient Re-evaluated prior to induction Oxygen Delivery Method: Circle system utilized Preoxygenation: Pre-oxygenation with 100% oxygen Induction Type: IV induction and Rapid sequence Laryngoscope Size: Miller and 2 Grade View: Grade I Tube type: Oral Tube size: 7.0 mm Number of attempts: 1 Airway Equipment and Method: Stylet Placement Confirmation: ETT inserted through vocal cords under direct vision, positive ETCO2 and breath sounds checked- equal and bilateral Secured at: 21 cm Tube secured with: Tape Dental Injury: Teeth and Oropharynx as per pre-operative assessment

## 2021-10-14 NOTE — H&P (View-Only) (Signed)
Referring Provider: Dr. Carmin Muskrat Primary Care Physician:  Darreld Mclean, MD Primary Gastroenterologist:  Dr. Carlean Purl  Reason for Consultation: Food bolus impaction  HPI: Gregory [name] is a 88 y.o. male with a past medical history of hypertension, coronary artery disease s/p CABG 0962, systolic CHF, ischemic cardiomyopathy, atrial fibrillation on Eliquis, carotid artery disease, CVA 2007, renal infarct 2017, GERD, esophageal stricture with food bolus impaction 12/2018 and 01/2020.  His daughter contacted Dr. Carlean Purl this morning to report her father had steak stuck in his throat since yesterday around 7pm. He is spitting out his saliva. Unable to drink fluids. He is unable to swallow his medications. He has a prior history of GERD with a benign esophageal stricture and passed food bolus impaction in 2020 and 2021. He was advised to go to the ED to expedite an EGD to remove the food bolus impaction. He takes Protonix '40mg'$  po bid. He is on Eliquis for afib, last dose was taken at 7:30 AM yesterday. No heartburn or abdominal pain. Bowel movements are normal. No rectal bleeding or black stools.  He denies having any chest pain, palpitations or shortness of breath.  His daughter Jenny Reichmann is at the bedside.  MOST RECENT GI PROCEDURES:  EGD 04/07/2021: One benign-appearing, 'double ring' GE junction stenosis was found at and just above the gastroesophageal junction; dilated today to 62m - The examination was otherwise normal  EGD 02/28/2020: - Benign-appearing esophageal stenoses. Two consecutive rings - Dilated. - Esophageal ulcer with no stigmata of recent bleeding. Biopsied. - 2 cm hiatal hernia. - The examination was otherwise normal.  EGD 02/09/2020: - Food in the middle third of the esophagus and in the lower third of the esophagus. Removal was successful. - Benign-appearing esophageal stenosis - Normal stomach. - Normal examined duodenum.  EGD 12/15/2018: - Food in the  distal esophagus. Removal was successful. - Benign-appearing esophageal stenosis. Dilated. 15 mm max - 2 cm hiatal hernia. - The examination was otherwise  Echo 04/14/2021: Left ventricular ejection fraction by 3D volume is 42 %. The left ventricle has moderately decreased function. The left ventricle has no regional wall motion abnormalities. There is mild concentric left ventricular hypertrophy. Left ventricular diastolic parameters are consistent with Grade I diastolic dysfunction (impaired relaxation). 1. 2. Right ventricular systolic function is normal. The right ventricular size is normal. 3. Left atrial size was severely dilated. The mitral valve is normal in structure. Mild mitral valve regurgitation. No evidence of mitral stenosis. 4. The aortic valve is normal in structure. Aortic valve regurgitation is mild. Aortic valve sclerosis is present, with no evidence of aortic valve stenosis. Aortic regurgitation PHT measures 670 msec. 5. 6. There is borderline dilatation of the aortic root, measuring 37 mm. The inferior vena cava is normal in size with greater than 50% respiratory variability, suggesting right atrial pressure of 3 mmHg.   Past Medical History:  Diagnosis Date   Acute respiratory failure ([facility]    Appendicitis with abscess 03/31/2011   Arthritis    CAD (coronary artery disease),CABG 1993-LIMA to the LAD, SVG to Om and SVG to PDA/PLA, patent on cath 2008. 2003   a. s/p CABG 2003. b. last cath 2008 with patent grafts.   Chronic atrial fibrillation (HCC)    permanent   Chronic systolic CHF (congestive heart failure) ([facility]    a. Prior low EF, later normalized.   COVID 03/14/2021   Esophageal obstruction due to food impaction    Esophageal stricture    Essential  hypertension    Food impaction of esophagus    GERD (gastroesophageal reflux disease)    Heart murmur    Hyperlipidemia    Ischemic cardiomyopathy    Kidney stones    "passed all but one time when  he had to have lithotripsy" (01/21/2013)   Renal infarct ([place])    a. 07/2015 - after holding Xarelto x 3 days for spinal procedure.   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 felt vasovagal in setting of pain from renal infarct.    Past Surgical History:  Procedure Laterality Date   BALLOON DILATION N/A 05/22/2015   Procedure: BALLOON DILATION;  Surgeon: Gatha Mayer, MD;  Location: [facility] ENDOSCOPY;  Service: Endoscopy;  Laterality: N/A;   BALLOON DILATION N/A 12/15/2018   Procedure: BALLOON DILATION;  Surgeon: Gatha Mayer, MD;  Location: [facility]OS[facility];  Service: Endoscopy;  Laterality: N/A;   CARDIAC CATHETERIZATION  02/24/2002   reduced LV function, 60-70% prox RCA stenosis, 70% PLA ostial stenosis, 80% secondary branch of PLA stenosis - subsequent CABG (Dr. Jackie Plum)   Carotid Doppler  06/2012   50-69% right bulb/prox ICA diameter reduction; 70-99% left bulb/prox ICA diameter reduction   CATARACT EXTRACTION W/ INTRAOCULAR LENS IMPLANT Bilateral 2013   COLONOSCOPY N/A 05/18/2016   Procedure: COLONOSCOPY;  Surgeon: Manus Gunning, MD;  Location: [facility] ENDOSCOPY;  Service: Gastroenterology;  Laterality: N/A;   CORONARY ANGIOPLASTY  07/05/2006   3 vessel CAD, patent LIMA to LAD, patentVG to OM, patent SVG to PDA & PLA, mild MR, severe LV systolic dysfunction (Dr. Gerrie Nordmann)   CORONARY ARTERY BYPASS GRAFT  03/01/2002   LIMA to LAD, reverse SVG to OM, reverse SVG to PDA of RCA, reverse SVG to PLA of RCA, ligation of LA appendage (Dr. Servando Snare)   ENTEROSCOPY N/A 05/20/2016   Procedure: ENTEROSCOPY;  Surgeon: Manus Gunning, MD;  Location: [place];  Service: Gastroenterology;  Laterality: N/A;   ESOPHAGOGASTRODUODENOSCOPY  02/01/2012   Procedure: ESOPHAGOGASTRODUODENOSCOPY (EGD);  Surgeon: Beryle Beams, MD;  Location: [facility];  Service:  Endoscopy;  Laterality: N/A;   ESOPHAGOGASTRODUODENOSCOPY N/A 05/22/2015   Procedure: ESOPHAGOGASTRODUODENOSCOPY (EGD);  Surgeon: Gatha Mayer, MD;  Location: [facility];  Service: Endoscopy;  Laterality: N/A;   ESOPHAGOGASTRODUODENOSCOPY N/A 05/17/2016   Procedure: ESOPHAGOGASTRODUODENOSCOPY (EGD);  Surgeon: Juanita Craver, MD;  Location: [facility] ENDOSCOPY;  Service: Endoscopy;  Laterality: N/A;   ESOPHAGOGASTRODUODENOSCOPY N/A 09/17/2017   Procedure: ESOPHAGOGASTRODUODENOSCOPY (EGD);  Surgeon: Milus Banister, MD;  Location: [facility] ENDOSCOPY;  Service: Endoscopy;  Laterality: N/A;   ESOPHAGOGASTRODUODENOSCOPY Left 02/09/2020   Procedure: ESOPHAGOGASTRODUODENOSCOPY (EGD);  Surgeon: Lavena Bullion, DO;  Location: [facility] ENDOSCOPY;  Service: Gastroenterology;  Laterality: Left;   ESOPHAGOGASTRODUODENOSCOPY (EGD) WITH PROPOFOL N/A 12/15/2018   Procedure: ESOPHAGOGASTRODUODENOSCOPY (EGD) WITH PROPOFOL;  Surgeon: Gatha Mayer, MD;  Location: [facility];  Service: Endoscopy;  Laterality: N/A;   ESOPHAGOGASTRODUODENOSCOPY (EGD) WITH PROPOFOL N/A 04/07/2021   Procedure: ESOPHAGOGASTRODUODENOSCOPY (EGD) WITH PROPOFOL;  Surgeon: Milus Banister, MD;  Location: [facility];  Service: Endoscopy;  Laterality: N/A;   FOREIGN BODY REMOVAL  09/17/2017   Procedure: FOREIGN BODY REMOVAL;  Surgeon: Milus Banister, MD;  Location: [facility] ENDOSCOPY;  Service: Endoscopy;;   FOREIGN BODY REMOVAL  12/15/2018   Procedure: FOREIGN BODY REMOVAL;  Surgeon: Gatha Mayer, MD;  Location: [facility] ENDOSCOPY;  Service: Endoscopy;;   FOREIGN BODY REMOVAL  02/09/2020   Procedure: FOREIGN BODY REMOVAL;  Surgeon: Lavena Bullion, DO;  Location: [place];  Service: Gastroenterology;;   GIVENS CAPSULE STUDY N/A 05/18/2016   Procedure: GIVENS CAPSULE STUDY;  Surgeon: Manus Gunning, MD;  Location: [place];  Service: Gastroenterology;  Laterality: N/A;   LAPAROSCOPIC APPENDECTOMY  03/30/2011   Procedure: APPENDECTOMY LAPAROSCOPIC;   Surgeon: Joyice Faster. Cornett, MD;  Location: [place];  Service: General;  Laterality: N/A;   LEFT HEART CATHETERIZATION WITH CORONARY ANGIOGRAM N/A 01/24/2013   Procedure: LEFT HEART CATHETERIZATION WITH CORONARY ANGIOGRAM;  Surgeon: [name], MD;  Location: [facility] CATH LAB;  Service: Cardiovascular;  Laterality: N/A;  Right heart with grafts   LITHOTRIPSY     "once" (01/21/2013)   [place]  05/2009   persantine myoview - fixed moderate perfusion defect in inferior wall & lateral segment of apex (poor non-transmural infarction), minimal anterolateral periinfarct reversible ischemia seen, abnormal study, defects similar to 2006 study   RIGHT/LEFT HEART CATH AND CORONARY ANGIOGRAPHY N/A 09/17/2017   Procedure: RIGHT/LEFT HEART CATH AND CORONARY ANGIOGRAPHY;  Surgeon: Martinique, Peter M, MD;  Location: [place] CV LAB;  Service: Cardiovascular;  Laterality: N/A;   SPLENECTOMY, TOTAL  01/2013   TEE WITHOUT CARDIOVERSION N/A 01/23/2013   Procedure: TRANSESOPHAGEAL ECHOCARDIOGRAM (TEE);  Surgeon: Sanda Klein, MD;  Location: [facility] ENDOSCOPY;  Service: Cardiovascular;  Laterality: N/A;   TRANSTHORACIC ECHOCARDIOGRAM  06/23/2012   EF 40-45%, mild LVH; mild AV regurg; mild MV regurg; LV mod-severely dilated; RV mildly dilated; systolic pressure borderline increased; RA mod dilated    Prior to Admission medications   Medication Sig Start Date End Date Taking? Authorizing Provider  acetaminophen (TYLENOL) 500 MG tablet Take 500 mg by mouth every 6 (six) hours as needed for moderate pain.    [provider]  apixaban (ELIQUIS) 2.5 MG TABS tablet TAKE 1 TABLET(2.5 MG) BY MOUTH TWICE DAILY Patient taking differently: Take 2.5 mg by mouth 2 (two) times daily. 12/16/20   [name], [name], MD  carvedilol (COREG) 3.125 MG tablet TAKE 1 TABLET(3.125 MG) BY MOUTH TWICE DAILY WITH A MEAL 08/01/21   Troy Sine, MD  ezetimibe (ZETIA) 10 MG tablet TAKE 1 TABLET(10 MG) BY MOUTH DAILY 10/08/21    Troy Sine, MD  furosemide (LASIX) 20 MG tablet TAKE 1 TABLET(20 MG) BY MOUTH EVERY OTHER DAY 08/01/21   Troy Sine, MD  gabapentin (NEURONTIN) 100 MG capsule Take 100 mg by mouth at bedtime. 04/30/21   [provider]  levothyroxine (SYNTHROID) 25 MCG tablet TAKE 1 TABLET(25 MCG) BY MOUTH DAILY BEFORE BREAKFAST Patient taking differently: Take 25 mcg by mouth daily before breakfast. 03/31/21   Troy Sine, MD  melatonin 3 MG TABS tablet Take 1 tablet (3 mg total) by mouth at bedtime. 04/09/21   Lavina Hamman, MD  pantoprazole (PROTONIX) 40 MG tablet TAKE 1 TABLET(40 MG) BY MOUTH TWICE DAILY Patient taking differently: Take 40 mg by mouth 2 (two) times daily. 01/28/21   Troy Sine, MD  potassium chloride (KLOR-CON M) 10 MEQ tablet TAKE 1 TABLET(10 MEQ) BY MOUTH EVERY OTHER DAY 08/01/21   Troy Sine, MD  rOPINIRole (REQUIP) 0.25 MG tablet TAKE 1 TABLET(0.25 MG) BY MOUTH TWICE DAILY 08/12/21   [name], [name], MD  rosuvastatin (CRESTOR) 40 MG tablet TAKE 1 TABLET(40 MG) BY MOUTH DAILY 06/16/21   Troy Sine, MD  tiZANidine (ZANAFLEX)  4 MG tablet TAKE 1 TABLET(4 MG) BY MOUTH EVERY 8 HOURS AS NEEDED FOR MUSCLE SPASMS. DO NOT COMBINE WITH TRAMADOL 11/27/20   [name], [name], MD  traMADol (ULTRAM) 50 MG tablet TAKE 1/2 TABLET BY MOUTH EVERY 8 HOURS AS NEEDED FOR PAIN 08/13/21   [name], [name], MD    No current facility-administered medications for this encounter.   Current Outpatient Medications  Medication Sig Dispense Refill   acetaminophen (TYLENOL) 500 MG tablet Take 500 mg by mouth every 6 (six) hours as needed for moderate pain.     apixaban (ELIQUIS) 2.5 MG TABS tablet TAKE 1 TABLET(2.5 MG) BY MOUTH TWICE DAILY (Patient taking differently: Take 2.5 mg by mouth 2 (two) times daily.) 180 tablet 2   carvedilol (COREG) 3.125 MG tablet TAKE 1 TABLET(3.125 MG) BY MOUTH TWICE DAILY WITH A MEAL 180 tablet 1   ezetimibe (ZETIA) 10 MG tablet TAKE 1 TABLET(10 MG) BY  MOUTH DAILY 90 tablet 2   furosemide (LASIX) 20 MG tablet TAKE 1 TABLET(20 MG) BY MOUTH EVERY OTHER DAY 45 tablet 0   gabapentin (NEURONTIN) 100 MG capsule Take 100 mg by mouth at bedtime.     levothyroxine (SYNTHROID) 25 MCG tablet TAKE 1 TABLET(25 MCG) BY MOUTH DAILY BEFORE BREAKFAST (Patient taking differently: Take 25 mcg by mouth daily before breakfast.) 90 tablet 1   melatonin 3 MG TABS tablet Take 1 tablet (3 mg total) by mouth at bedtime. 30 tablet 0   pantoprazole (PROTONIX) 40 MG tablet TAKE 1 TABLET(40 MG) BY MOUTH TWICE DAILY (Patient taking differently: Take 40 mg by mouth 2 (two) times daily.) 180 tablet 2   potassium chloride (KLOR-CON M) 10 MEQ tablet TAKE 1 TABLET(10 MEQ) BY MOUTH EVERY OTHER DAY 45 tablet 0   rOPINIRole (REQUIP) 0.25 MG tablet TAKE 1 TABLET(0.25 MG) BY MOUTH TWICE DAILY 180 tablet 3   rosuvastatin (CRESTOR) 40 MG tablet TAKE 1 TABLET(40 MG) BY MOUTH DAILY 90 tablet 1   tiZANidine (ZANAFLEX) 4 MG tablet TAKE 1 TABLET(4 MG) BY MOUTH EVERY 8 HOURS AS NEEDED FOR MUSCLE SPASMS. DO NOT COMBINE WITH TRAMADOL 60 tablet 1   traMADol (ULTRAM) 50 MG tablet TAKE 1/2 TABLET BY MOUTH EVERY 8 HOURS AS NEEDED FOR PAIN 30 tablet 1    Allergies as of 10/14/2021 - Review Complete 10/14/2021  Allergen Reaction Noted   Percocet [oxycodone-acetaminophen] Itching and Nausea Only 04/09/2011    Family History  Problem Relation Age of Onset   Heart disease Father        rheumatic fever   Heart attack Brother 92   Stroke Mother    Stroke Brother 73   Stomach cancer Neg Hx    Rectal cancer Neg Hx    Prostate cancer Neg Hx    Pancreatic cancer Neg Hx    Ovarian cancer Neg Hx     Social History   Socioeconomic History   Marital status: Married    Spouse name: Evelena Peat   Number of children: 9   Years of education: 8   Highest education level: Not on file  Occupational History   Occupation: retired  Tobacco Use   Smoking status: Former    Packs/day: 2.00    Years: 20.00     Total pack years: 40.00    Types: Cigarettes    Quit date: 04/08/1986    Years since quitting: 35.5   Smokeless tobacco: Current    Types: Chew   Tobacco comments:    Encouraged cessation  Vaping Use   Vaping Use: Never used  Substance and Sexual Activity   Alcohol use: No    Alcohol/week: 0.0 standard drinks of alcohol   Drug use: No   Sexual activity: Not on file  Other Topics Concern   Not on file  Social History Narrative    married, 5 sons 4 daughters. He is retired Horticulturist, commercial. 2-3 caffeinated beverages a day no alcohol   Social Determinants of Health   Financial Resource Strain: Low Risk  (04/15/2021)   Overall Financial Resource Strain (CARDIA)    Difficulty of Paying Living Expenses: Not hard at all  Food Insecurity: No Food Insecurity (04/15/2021)   Hunger Vital Sign    Worried About Running Out of Food in the Last Year: Never true    Ran Out of Food in the Last Year: Never true  Transportation Needs: No Transportation Needs (04/15/2021)   PRAPARE - Hydrologist (Medical): No    Lack of Transportation (Non-Medical): No  Physical Activity: Not on file  Stress: Not on file  Social Connections: Not on file  Intimate Partner Violence: Not on file    Review of Systems:  See HPI, all other systems reviewed and are negative.   Physical Exam: Vital signs in last 24 hours: Temp:  [98.2 F (36.8 C)] 98.2 F (36.8 C) (08/01 1351) Pulse Rate:  [88] 88 (08/01 1351) Resp:  [18] 18 (08/01 1351) BP: (129)/(47) 129/47 (08/01 1351) SpO2:  [92 %-96 %] 96 % (08/01 1400)   General:  Alert 88 year old male in NAD.  Head:  Normocephalic and atraumatic. Eyes:  No scleral icterus. Conjunctiva pink. Ears:  Normal auditory acuity. Nose:  No deformity, discharge or lesions. Mouth: Upper and lower dentures. No ulcers or lesions.  Neck:  Supple. No lymphadenopathy or thyromegaly.  Lungs: Breath sounds clear throughout.  Heart: Irregular rhythm, no  murmur.  Abdomen:  Soft, nontender. No masses.  Rectal: Deferred. Musculoskeletal:  Symmetrical without gross deformities.  Pulses:  Normal pulses noted. Extremities:  Without clubbing or edema. Neurologic:  Alert and  oriented x 4. No focal deficits.  Skin:  Intact without significant lesions or rashes. Psych:  Alert and cooperative. Normal mood and affect.  Intake/Output from previous day: No intake/output data recorded. Intake/Output this shift: No intake/output data recorded.  Lab Results: No results for input(s): "WBC", "HGB", "HCT", "PLT" in the last 72 hours. BMET No results for input(s): "NA", "K", "CL", "CO2", "GLUCOSE", "BUN", "CREATININE", "CALCIUM" in the last 72 hours. LFT No results for input(s): "PROT", "ALBUMIN", "AST", "ALT", "ALKPHOS", "BILITOT", "BILIDIR", "IBILI" in the last 72 hours. PT/INR No results for input(s): "LABPROT", "INR" in the last 72 hours. Hepatitis Panel No results for input(s): "HEPBSAG", "HCVAB", "HEPAIGM", "HEPBIGM" in the last 72 hours.    Studies/Results: No results found.  IMPRESSION/PLAN:  71) 88 year old male with a history of GERD, esophageal stricture with recurrent food bolus impaction in 2020 and 2021. He ate steak yesterday evening around 7 PM which became stuck in his upper esophagus.  He is spitting up his saliva, unable to drink fluids.  His most recent EGD was 04/07/2021 which showed benign appearing double ring GE junction stenosis which was dilated. -Stat CBC, CMP and INR -Urgent EGD to remove food bolus impaction. EGD benefits and risks discussed including risk with sedation, risk of bleeding, perforation and infection  -NPO -Protonix 40 mg IV x1 now -IV fluids per the ED physician -Further recommendations to be  determined after EGD completed  2) Atrial fibrillation on Eliquis, last dose taken at 737/31/2023  3) CHF, ischemic cardiomyopathy.  EF 40%.  4) History of CAD s/p CABG 2003   Patrecia Pour Kennedy-Smith   10/14/2021, 4:22PM   ------------------------------------------------------------------------------------  I have taken a history, reviewed the chart and examined the patient. I performed a substantive portion of this encounter, including complete performance of at least one of the key components, in conjunction with the APP. I agree with the APP's note, impression and recommendations  88 year old male with extensive cardiac history as above, presenting with esophageal food impaction as of yesterday evening.  Last dose of Eliquis was nearly 36 hours, should be low risk for bleeding, even with dilation. Plan for urgent EGD today to removed impacted food bolus and potential dilation.  The details, risks (including bleeding, perforation, infection, missed lesions, medication reactions and possible hospitalization or surgery if complications occur), benefits, and alternatives to EGD with possible biopsy and possible dilation were discussed with the patient and he consents to proceed.    Husein Guedes E. Candis Schatz, MD [facility] Gastroenterology

## 2021-10-14 NOTE — ED Triage Notes (Signed)
Pt reports multiple incidents of food bolus getting stuck.   Reports eating steak last night and now stuck in a throat.   Reports watermelon and shrimp got stuck last week as well.    A/Ox4 Ambulatory in triage with cane

## 2021-10-14 NOTE — Transfer of Care (Signed)
Immediate Anesthesia Transfer of Care Note  Patient: Gregory [name]  Procedure(s) Performed: ESOPHAGOGASTRODUODENOSCOPY (EGD) WITH PROPOFOL FOREIGN BODY REMOVAL BALLOON DILATION  Patient Location: PACU  Anesthesia Type:General  Level of Consciousness: sedated, patient cooperative and responds to stimulation  Airway & Oxygen Therapy: Patient Spontanous Breathing and Patient connected to face mask oxygen  Post-op Assessment: Report given to RN and Post -op Vital signs reviewed and stable  Post vital signs: Reviewed and stable  Last Vitals:  Vitals Value Taken Time  BP    Temp    Pulse 90 10/14/21 2011  Resp 13 10/14/21 2011  SpO2 100 % 10/14/21 2011  Vitals shown include unvalidated device data.  Last Pain:  Vitals:   10/14/21 1749  TempSrc:   PainSc: 4          Complications: No notable events documented.

## 2021-10-14 NOTE — Op Note (Signed)
[facility] Patient Name: Gregory [name] Procedure Date: 10/14/2021 MRN: [id number] Attending MD: Gladstone Pih. Candis Schatz , MD Date of Birth: 23-Mar-1932 CSN: [id number] Age: 88 Admit Type: Outpatient Procedure:                Upper GI endoscopy Indications:              Foreign body in the esophagus Providers:                Nicki Reaper E. Candis Schatz, MD, Grace Isaac, RN,                            William Dalton, Technician Referring MD:              Medicines:                General Anesthesia Complications:            No immediate complications. Estimated Blood Loss:     Estimated blood loss was minimal. Procedure:                Pre-Anesthesia Assessment:                           - Prior to the procedure, a History and Physical                            was performed, and patient medications and                            allergies were reviewed. The patient's tolerance of                            previous anesthesia was also reviewed. The risks                            and benefits of the procedure and the sedation                            options and risks were discussed with the patient.                            All questions were answered, and informed consent                            was obtained. Prior Anticoagulants: The patient has                            taken Eliquis (apixaban), last dose was 1 day prior                            to procedure. ASA Grade Assessment: III - A patient                            with severe systemic disease. After reviewing the  risks and benefits, the patient was deemed in                            satisfactory condition to undergo the procedure.                           After obtaining informed consent, the endoscope was                            passed under direct vision. Throughout the                            procedure, the patient's blood pressure, pulse, and                             oxygen saturations were monitored continuously. The                            [id number]-H190 ([id number]) Olympus endoscope was introduced                            through the mouth, and advanced to the second part                            of duodenum. The upper GI endoscopy was                            accomplished without difficulty. The patient                            tolerated the procedure well. Scope In: Scope Out: Findings:      The examined portions of the nasopharynx, oropharynx and larynx were       normal.      Food was found in the lower third of the esophagus. Gentle pressure to       push the bolus into the stomach was unsuccessful. Removal of food was       accomplished with some difficultyusing a combination of Rat tooth       forceps, retriever net and Talon grasper. Estimated blood loss was       minimal.      One benign-appearing, intrinsic mild stenosis was found in the distal       esophagus. This stenosis measured 1.3 cm (inner diameter) x 1 cm (in       length). The stenosis was traversed. A TTS dilator was passed through       the scope. Dilation with a 15-16.5-18 mm balloon dilator was performed       to 16.5 mm. The dilation site was examined and showed moderate mucosal       disruption. Estimated blood loss was minimal.      The lower third of the esophagus was significantly tortuous, with an       L-shaped configuration in the final few centimeters.      A small hiatal hernia was present.      The exam of the stomach was otherwise normal.      The examined duodenum  was normal. Impression:               - The examined portions of the nasopharynx,                            oropharynx and larynx were normal.                           - Food in the lower third of the esophagus. Removal                            was successful.                           - Benign-appearing esophageal stenosis. Dilated.                           - Tortuous esophagus.                            - Small hiatal hernia.                           - Normal examined duodenum. Moderate Sedation:      Not Applicable - Patient had care per Anesthesia. Recommendation:           - Patient has a contact number available for                            emergencies. The signs and symptoms of potential                            delayed complications were discussed with the                            patient. Return to normal activities tomorrow.                            Written discharge instructions were provided to the                            patient.                           - Clear liquid diet for 1 day, then soft diet for 1                            day.                           - Resume Eliquis (apixaban) at prior dose Thursday.                           - Continue Protonix (pantoprazole) 40 mg PO BID.                           - Would  recommend avoiding chewy meats such as                            steak indefinitely                           - Can consider repeat dilation as outpatient. Procedure Code(s):        --- Professional ---                           [id number], Esophagogastroduodenoscopy, flexible,                            transoral; with removal of foreign body(s)                           43249, Esophagogastroduodenoscopy, flexible,                            transoral; with transendoscopic balloon dilation of                            esophagus (less than 30 mm diameter) Diagnosis Code(s):        --- Professional ---                           E36.629U, Food in esophagus causing other injury,                            initial encounter                           K22.2, Esophageal obstruction                           Q39.9, Congenital malformation of esophagus,                            unspecified                           K44.9, Diaphragmatic hernia without obstruction or                            gangrene                           T18.108A,  Unspecified foreign body in esophagus                            causing other injury, initial encounter CPT copyright 2019 [employer]. All rights reserved. The codes documented in this report are preliminary and upon coder review may  be revised to meet current compliance requirements. Elianis Fischbach E. Candis Schatz, MD 10/14/2021 8:14:42 PM This report has been signed electronically. Number of Addenda: 0

## 2021-10-14 NOTE — ED Provider Notes (Signed)
[place] DEPT Provider Note   CSN: [id number] Arrival date & time: 10/14/21  1343     History  Chief Complaint  Patient presents with   food bolus    Gregory [name] is a 88 y.o. male.  HPI Patient presents with his daughter provides much of the history.  Patient has a history of esophageal dysmotility and has had prior dilatation.  He notes that 3 days ago after eating shrimp he had a brief period of impacted food bolus, but that passed.  However, since yesterday after another sudden onset of sensation of food impaction following eating steak he has been unable to eat or drink, or tolerate his own secretions.  He has some discomfort in the upper chest, but no abdominal pain, no other complaints.  After speaking with his gastroenterologist today he was referred to the emergency department for expeditious evaluation.   Home Medications Prior to Admission medications   Medication Sig Start Date End Date Taking? Authorizing Provider  acetaminophen (TYLENOL) 500 MG tablet Take 500 mg by mouth every 6 (six) hours as needed for moderate pain.    [provider]  apixaban (ELIQUIS) 2.5 MG TABS tablet TAKE 1 TABLET(2.5 MG) BY MOUTH TWICE DAILY Patient taking differently: Take 2.5 mg by mouth 2 (two) times daily. 12/16/20   [name], [name], MD  carvedilol (COREG) 3.125 MG tablet TAKE 1 TABLET(3.125 MG) BY MOUTH TWICE DAILY WITH A MEAL 08/01/21   Troy Sine, MD  ezetimibe (ZETIA) 10 MG tablet TAKE 1 TABLET(10 MG) BY MOUTH DAILY 10/08/21   Troy Sine, MD  furosemide (LASIX) 20 MG tablet TAKE 1 TABLET(20 MG) BY MOUTH EVERY OTHER DAY 08/01/21   Troy Sine, MD  gabapentin (NEURONTIN) 100 MG capsule Take 100 mg by mouth at bedtime. 04/30/21   [provider]  levothyroxine (SYNTHROID) 25 MCG tablet TAKE 1 TABLET(25 MCG) BY MOUTH DAILY BEFORE BREAKFAST Patient taking differently: Take 25 mcg by mouth daily before breakfast. 03/31/21    Troy Sine, MD  melatonin 3 MG TABS tablet Take 1 tablet (3 mg total) by mouth at bedtime. 04/09/21   Lavina Hamman, MD  pantoprazole (PROTONIX) 40 MG tablet TAKE 1 TABLET(40 MG) BY MOUTH TWICE DAILY Patient taking differently: Take 40 mg by mouth 2 (two) times daily. 01/28/21   Troy Sine, MD  potassium chloride (KLOR-CON M) 10 MEQ tablet TAKE 1 TABLET(10 MEQ) BY MOUTH EVERY OTHER DAY 08/01/21   Troy Sine, MD  rOPINIRole (REQUIP) 0.25 MG tablet TAKE 1 TABLET(0.25 MG) BY MOUTH TWICE DAILY 08/12/21   [name], [name], MD  rosuvastatin (CRESTOR) 40 MG tablet TAKE 1 TABLET(40 MG) BY MOUTH DAILY 06/16/21   Troy Sine, MD  tiZANidine (ZANAFLEX) 4 MG tablet TAKE 1 TABLET(4 MG) BY MOUTH EVERY 8 HOURS AS NEEDED FOR MUSCLE SPASMS. DO NOT COMBINE WITH TRAMADOL 11/27/20   [name], [name], MD  traMADol (ULTRAM) 50 MG tablet TAKE 1/2 TABLET BY MOUTH EVERY 8 HOURS AS NEEDED FOR PAIN 08/13/21   [name], [name], MD      Allergies    Percocet [oxycodone-acetaminophen]    Review of Systems   Review of Systems  All other systems reviewed and are negative.   Physical Exam Updated Vital Signs BP (!) 129/47 (BP Location: Right Arm)   Pulse 88   Temp 98.2 F (36.8 C) (Oral)   Resp 18   SpO2 96%  Physical Exam Vitals and nursing  note reviewed.  Constitutional:      General: He is not in acute distress.    Appearance: He is well-developed.  HENT:     Head: Normocephalic and atraumatic.  Eyes:     Conjunctiva/sclera: Conjunctivae normal.  Cardiovascular:     Rate and Rhythm: Normal rate and regular rhythm.  Pulmonary:     Effort: Pulmonary effort is normal. No respiratory distress.     Breath sounds: No stridor.  Abdominal:     General: There is no distension.  Skin:    General: Skin is warm and dry.  Neurological:     Mental Status: He is alert and oriented to person, place, and time.     ED Results / Procedures / Treatments   Labs (all labs ordered are listed, but  only abnormal results are displayed) Labs Reviewed - No data to display  EKG None  Radiology No results found.  Procedures Procedures    Medications Ordered in ED Medications - No data to display  ED Course/ Medical Decision Making/ A&P This patient with a Hx of esophageal dysmotility prior impacted food bolus endoscopy presents to the ED for concern of inability to tolerate solids and liquids since last night, this involves an extensive number of treatment options, and is a complaint that carries with it a high risk of complications and morbidity.    The differential diagnosis includes impacted food bolus, less likely infection, pneumothorax, other intrathoracic abnormality   Social Determinants of Health:  Advanced age  Additional history obtained:  Additional history and/or information obtained from daughter at bedside and chart review, notable for daughter provides details of the history, chart review notable for ongoing GI care, including prior endoscopy due to food bolus   After the initial evaluation, orders, including: Consultation with GI were initiated.   Patient placed on Cardiac and Pulse-Oximetry Monitors. The patient was maintained on a cardiac monitor.  The cardiac monitored showed an rhythm of 90 sinus normal The patient was also maintained on pulse oximetry. The readings were typically 100% room air normal   On repeat evaluation of the patient stayed the same    Consultations Obtained:  I requested consultation with the gastroenterology,  and discussed lab and imaging findings as well as pertinent plan - they recommend: Endoscopy for retrieval/infection of presumed esophageal foreign body/food impaction  Dispostion / Final MDM:  After consideration of the diagnostic results and the patient's response to treatment, it is awaiting GI care.  He is otherwise awake and alert, sitting upright, has no fever, no increased work of breathing, no current  indication for additional imaging studies, nor labs.  This adult male who is presenting due to inability to tolerate liquids and solids since last night with presumed esophageal food bolus will likely have endoscopy, disposition pending.   Final Clinical Impression(s) / ED Diagnoses Final diagnoses:  Foreign body in esophagus, initial encounter    Rx / DC Orders ED Discharge Orders     None         Carmin Muskrat, MD 10/14/21 [phone]

## 2021-10-14 NOTE — Telephone Encounter (Signed)
Spoke with pt daughter [name]: [name] stated that they have in a fast track room in the ED  and  triaging him.

## 2021-10-14 NOTE — Consult Note (Addendum)
Referring Provider: Dr. Carmin Muskrat Primary Care Physician:  Darreld Mclean, MD Primary Gastroenterologist:  Dr. Carlean Purl  Reason for Consultation: Food bolus impaction  HPI: Gregory [name] is a 88 y.o. male with a past medical history of hypertension, coronary artery disease s/p CABG 7035, systolic CHF, ischemic cardiomyopathy, atrial fibrillation on Eliquis, carotid artery disease, CVA 2007, renal infarct 2017, GERD, esophageal stricture with food bolus impaction 12/2018 and 01/2020.  His daughter contacted Dr. Carlean Purl this morning to report her father had steak stuck in his throat since yesterday around 7pm. He is spitting out his saliva. Unable to drink fluids. He is unable to swallow his medications. He has a prior history of GERD with a benign esophageal stricture and passed food bolus impaction in 2020 and 2021. He was advised to go to the ED to expedite an EGD to remove the food bolus impaction. He takes Protonix '40mg'$  po bid. He is on Eliquis for afib, last dose was taken at 7:30 AM yesterday. No heartburn or abdominal pain. Bowel movements are normal. No rectal bleeding or black stools.  He denies having any chest pain, palpitations or shortness of breath.  His daughter Jenny Reichmann is at the bedside.  MOST RECENT GI PROCEDURES:  EGD 04/07/2021: One benign-appearing, 'double ring' GE junction stenosis was found at and just above the gastroesophageal junction; dilated today to 7m - The examination was otherwise normal  EGD 02/28/2020: - Benign-appearing esophageal stenoses. Two consecutive rings - Dilated. - Esophageal ulcer with no stigmata of recent bleeding. Biopsied. - 2 cm hiatal hernia. - The examination was otherwise normal.  EGD 02/09/2020: - Food in the middle third of the esophagus and in the lower third of the esophagus. Removal was successful. - Benign-appearing esophageal stenosis - Normal stomach. - Normal examined duodenum.  EGD 12/15/2018: - Food in the  distal esophagus. Removal was successful. - Benign-appearing esophageal stenosis. Dilated. 15 mm max - 2 cm hiatal hernia. - The examination was otherwise  Echo 04/14/2021: Left ventricular ejection fraction by 3D volume is 42 %. The left ventricle has moderately decreased function. The left ventricle has no regional wall motion abnormalities. There is mild concentric left ventricular hypertrophy. Left ventricular diastolic parameters are consistent with Grade I diastolic dysfunction (impaired relaxation). 1. 2. Right ventricular systolic function is normal. The right ventricular size is normal. 3. Left atrial size was severely dilated. The mitral valve is normal in structure. Mild mitral valve regurgitation. No evidence of mitral stenosis. 4. The aortic valve is normal in structure. Aortic valve regurgitation is mild. Aortic valve sclerosis is present, with no evidence of aortic valve stenosis. Aortic regurgitation PHT measures 670 msec. 5. 6. There is borderline dilatation of the aortic root, measuring 37 mm. The inferior vena cava is normal in size with greater than 50% respiratory variability, suggesting right atrial pressure of 3 mmHg.   Past Medical History:  Diagnosis Date   Acute respiratory failure ([facility]    Appendicitis with abscess 03/31/2011   Arthritis    CAD (coronary artery disease),CABG 1993-LIMA to the LAD, SVG to Om and SVG to PDA/PLA, patent on cath 2008. 2003   a. s/p CABG 2003. b. last cath 2008 with patent grafts.   Chronic atrial fibrillation (HCC)    permanent   Chronic systolic CHF (congestive heart failure) ([place]    a. Prior low EF, later normalized.   COVID 03/14/2021   Esophageal obstruction due to food impaction    Esophageal stricture    Essential  hypertension    Food impaction of esophagus    GERD (gastroesophageal reflux disease)    Heart murmur    Hyperlipidemia    Ischemic cardiomyopathy    Kidney stones    "passed all but one time when  he had to have lithotripsy" (01/21/2013)   Renal infarct ([place])    a. 07/2015 - after holding Xarelto x 3 days for spinal procedure.   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 felt vasovagal in setting of pain from renal infarct.    Past Surgical History:  Procedure Laterality Date   BALLOON DILATION N/A 05/22/2015   Procedure: BALLOON DILATION;  Surgeon: Gatha Mayer, MD;  Location: [facility] ENDOSCOPY;  Service: Endoscopy;  Laterality: N/A;   BALLOON DILATION N/A 12/15/2018   Procedure: BALLOON DILATION;  Surgeon: Gatha Mayer, MD;  Location: [facility] ENDOSCOPY;  Service: Endoscopy;  Laterality: N/A;   CARDIAC CATHETERIZATION  02/24/2002   reduced LV function, 60-70% prox RCA stenosis, 70% PLA ostial stenosis, 80% secondary branch of PLA stenosis - subsequent CABG (Dr. Jackie Plum)   Carotid Doppler  06/2012   50-69% right bulb/prox ICA diameter reduction; 70-99% left bulb/prox ICA diameter reduction   CATARACT EXTRACTION W/ INTRAOCULAR LENS IMPLANT Bilateral 2013   COLONOSCOPY N/A 05/18/2016   Procedure: COLONOSCOPY;  Surgeon: Manus Gunning, MD;  Location: [facility] ENDOSCOPY;  Service: Gastroenterology;  Laterality: N/A;   CORONARY ANGIOPLASTY  07/05/2006   3 vessel CAD, patent LIMA to LAD, patentVG to OM, patent SVG to PDA & PLA, mild MR, severe LV systolic dysfunction (Dr. Gerrie Nordmann)   CORONARY ARTERY BYPASS GRAFT  03/01/2002   LIMA to LAD, reverse SVG to OM, reverse SVG to PDA of RCA, reverse SVG to PLA of RCA, ligation of LA appendage (Dr. Servando Snare)   ENTEROSCOPY N/A 05/20/2016   Procedure: ENTEROSCOPY;  Surgeon: Manus Gunning, MD;  Location: [place];  Service: Gastroenterology;  Laterality: N/A;   ESOPHAGOGASTRODUODENOSCOPY  02/01/2012   Procedure: ESOPHAGOGASTRODUODENOSCOPY (EGD);  Surgeon: Beryle Beams, MD;  Location: [facility] END[facility];  Service:  Endoscopy;  Laterality: N/A;   ESOPHAGOGASTRODUODENOSCOPY N/A 05/22/2015   Procedure: ESOPHAGOGASTRODUODENOSCOPY (EGD);  Surgeon: Gatha Mayer, MD;  Location: [facility] ENDOSCOPY;  Service: Endoscopy;  Laterality: N/A;   ESOPHAGOGASTRODUODENOSCOPY N/A 05/17/2016   Procedure: ESOPHAGOGASTRODUODENOSCOPY (EGD);  Surgeon: Juanita Craver, MD;  Location: [facility] ENDOSCOPY;  Service: Endoscopy;  Laterality: N/A;   ESOPHAGOGASTRODUODENOSCOPY N/A 09/17/2017   Procedure: ESOPHAGOGASTRODUODENOSCOPY (EGD);  Surgeon: Milus Banister, MD;  Location[facility] ENDOSCOPY;  Service: Endoscopy;  Laterality: N/A;   ESOPHAGOGASTRODUODENOSCOPY Left 02/09/2020   Procedure: ESOPHAGOGASTRODUODENOSCOPY (EGD);  Surgeon: Lavena Bullion, DO;  Location: [facility] ENDOSCOPY;  Service: Gastroenterology;  Laterality: Left;   ESOPHAGOGASTRODUODENOSCOPY (EGD) WITH PROPOFOL N/A 12/15/2018   Procedure: ESOPHAGOGASTRODUODENOSCOPY (EGD) WITH PROPOFOL;  Surgeon: Gatha Mayer, MD;  Location: [facility] ENDOSCOPY;  Service: Endoscopy;  Laterality: N/A;   ESOPHAGOGASTRODUODENOSCOPY (EGD) WITH PROPOFOL N/A 04/07/2021   Procedure: ESOPHAGOGASTRODUODENOSCOPY (EGD) WITH PROPOFOL;  Surgeon: Milus Banister, MD;  Location: [facility] ENDOSCOPY;  Service: Endoscopy;  Laterality: N/A;   FOREIGN BODY REMOVAL  09/17/2017   Procedure: FOREIGN BODY REMOVAL;  Surgeon: Milus Banister, MD;  Location: [facility] ENDOSCOPY;  Service: Endoscopy;;   FOREIGN BODY REMOVAL  12/15/2018   Procedure: FOREIGN BODY REMOVAL;  Surgeon: Gatha Mayer, MD;  Location: [facility] ENDOSCOPY;  Service: Endoscopy;;   FOREIGN BODY REMOVAL  02/09/2020   Procedure: FOREIGN BODY REMOVAL;  Surgeon: Lavena Bullion, DO;  Location: [place];  Service: Gastroenterology;;   GIVENS CAPSULE STUDY N/A 05/18/2016   Procedure: GIVENS CAPSULE STUDY;  Surgeon: Manus Gunning, MD;  Location: [place];  Service: Gastroenterology;  Laterality: N/A;   LAPAROSCOPIC APPENDECTOMY  03/30/2011   Procedure: APPENDECTOMY LAPAROSCOPIC;   Surgeon: Joyice Faster. Cornett, MD;  Location: [place];  Service: General;  Laterality: N/A;   LEFT HEART CATHETERIZATION WITH CORONARY ANGIOGRAM N/A 01/24/2013   Procedure: LEFT HEART CATHETERIZATION WITH CORONARY ANGIOGRAM;  Surgeon: [name], MD;  Location: [facility] CATH LAB;  Service: Cardiovascular;  Laterality: N/A;  Right heart with grafts   LITHOTRIPSY     "once" (01/21/2013)   [place]  05/2009   persantine myoview - fixed moderate perfusion defect in inferior wall & lateral segment of apex (poor non-transmural infarction), minimal anterolateral periinfarct reversible ischemia seen, abnormal study, defects similar to 2006 study   RIGHT/LEFT HEART CATH AND CORONARY ANGIOGRAPHY N/A 09/17/2017   Procedure: RIGHT/LEFT HEART CATH AND CORONARY ANGIOGRAPHY;  Surgeon: Martinique, Peter M, MD;  Location: [place] CV LAB;  Service: Cardiovascular;  Laterality: N/A;   SPLENECTOMY, TOTAL  01/2013   TEE WITHOUT CARDIOVERSION N/A 01/23/2013   Procedure: TRANSESOPHAGEAL ECHOCARDIOGRAM (TEE);  Surgeon: Sanda Klein, MD;  Location: [facility] ENDOSCOPY;  Service: Cardiovascular;  Laterality: N/A;   TRANSTHORACIC ECHOCARDIOGRAM  06/23/2012   EF 40-45%, mild LVH; mild AV regurg; mild MV regurg; LV mod-severely dilated; RV mildly dilated; systolic pressure borderline increased; RA mod dilated    Prior to Admission medications   Medication Sig Start Date End Date Taking? Authorizing Provider  acetaminophen (TYLENOL) 500 MG tablet Take 500 mg by mouth every 6 (six) hours as needed for moderate pain.    [provider]  apixaban (ELIQUIS) 2.5 MG TABS tablet TAKE 1 TABLET(2.5 MG) BY MOUTH TWICE DAILY Patient taking differently: Take 2.5 mg by mouth 2 (two) times daily. 12/16/20   [name], [name], MD  carvedilol (COREG) 3.125 MG tablet TAKE 1 TABLET(3.125 MG) BY MOUTH TWICE DAILY WITH A MEAL 08/01/21   Troy Sine, MD  ezetimibe (ZETIA) 10 MG tablet TAKE 1 TABLET(10 MG) BY MOUTH DAILY 10/08/21    Troy Sine, MD  furosemide (LASIX) 20 MG tablet TAKE 1 TABLET(20 MG) BY MOUTH EVERY OTHER DAY 08/01/21   Troy Sine, MD  gabapentin (NEURONTIN) 100 MG capsule Take 100 mg by mouth at bedtime. 04/30/21   [provider]  levothyroxine (SYNTHROID) 25 MCG tablet TAKE 1 TABLET(25 MCG) BY MOUTH DAILY BEFORE BREAKFAST Patient taking differently: Take 25 mcg by mouth daily before breakfast. 03/31/21   Troy Sine, MD  melatonin 3 MG TABS tablet Take 1 tablet (3 mg total) by mouth at bedtime. 04/09/21   Lavina Hamman, MD  pantoprazole (PROTONIX) 40 MG tablet TAKE 1 TABLET(40 MG) BY MOUTH TWICE DAILY Patient taking differently: Take 40 mg by mouth 2 (two) times daily. 01/28/21   Troy Sine, MD  potassium chloride (KLOR-CON M) 10 MEQ tablet TAKE 1 TABLET(10 MEQ) BY MOUTH EVERY OTHER DAY 08/01/21   Troy Sine, MD  rOPINIRole (REQUIP) 0.25 MG tablet TAKE 1 TABLET(0.25 MG) BY MOUTH TWICE DAILY 08/12/21   [name], [name], MD  rosuvastatin (CRESTOR) 40 MG tablet TAKE 1 TABLET(40 MG) BY MOUTH DAILY 06/16/21   Troy Sine, MD  tiZANidine (ZANAFLEX)  4 MG tablet TAKE 1 TABLET(4 MG) BY MOUTH EVERY 8 HOURS AS NEEDED FOR MUSCLE SPASMS. DO NOT COMBINE WITH TRAMADOL 11/27/20   [name], [name], MD  traMADol (ULTRAM) 50 MG tablet TAKE 1/2 TABLET BY MOUTH EVERY 8 HOURS AS NEEDED FOR PAIN 08/13/21   [name], [name], MD    No current facility-administered medications for this encounter.   Current Outpatient Medications  Medication Sig Dispense Refill   acetaminophen (TYLENOL) 500 MG tablet Take 500 mg by mouth every 6 (six) hours as needed for moderate pain.     apixaban (ELIQUIS) 2.5 MG TABS tablet TAKE 1 TABLET(2.5 MG) BY MOUTH TWICE DAILY (Patient taking differently: Take 2.5 mg by mouth 2 (two) times daily.) 180 tablet 2   carvedilol (COREG) 3.125 MG tablet TAKE 1 TABLET(3.125 MG) BY MOUTH TWICE DAILY WITH A MEAL 180 tablet 1   ezetimibe (ZETIA) 10 MG tablet TAKE 1 TABLET(10 MG) BY  MOUTH DAILY 90 tablet 2   furosemide (LASIX) 20 MG tablet TAKE 1 TABLET(20 MG) BY MOUTH EVERY OTHER DAY 45 tablet 0   gabapentin (NEURONTIN) 100 MG capsule Take 100 mg by mouth at bedtime.     levothyroxine (SYNTHROID) 25 MCG tablet TAKE 1 TABLET(25 MCG) BY MOUTH DAILY BEFORE BREAKFAST (Patient taking differently: Take 25 mcg by mouth daily before breakfast.) 90 tablet 1   melatonin 3 MG TABS tablet Take 1 tablet (3 mg total) by mouth at bedtime. 30 tablet 0   pantoprazole (PROTONIX) 40 MG tablet TAKE 1 TABLET(40 MG) BY MOUTH TWICE DAILY (Patient taking differently: Take 40 mg by mouth 2 (two) times daily.) 180 tablet 2   potassium chloride (KLOR-CON M) 10 MEQ tablet TAKE 1 TABLET(10 MEQ) BY MOUTH EVERY OTHER DAY 45 tablet 0   rOPINIRole (REQUIP) 0.25 MG tablet TAKE 1 TABLET(0.25 MG) BY MOUTH TWICE DAILY 180 tablet 3   rosuvastatin (CRESTOR) 40 MG tablet TAKE 1 TABLET(40 MG) BY MOUTH DAILY 90 tablet 1   tiZANidine (ZANAFLEX) 4 MG tablet TAKE 1 TABLET(4 MG) BY MOUTH EVERY 8 HOURS AS NEEDED FOR MUSCLE SPASMS. DO NOT COMBINE WITH TRAMADOL 60 tablet 1   traMADol (ULTRAM) 50 MG tablet TAKE 1/2 TABLET BY MOUTH EVERY 8 HOURS AS NEEDED FOR PAIN 30 tablet 1    Allergies as of 10/14/2021 - Review Complete 10/14/2021  Allergen Reaction Noted   Percocet [oxycodone-acetaminophen] Itching and Nausea Only 04/09/2011    Family History  Problem Relation Age of Onset   Heart disease Father        rheumatic fever   Heart attack Brother 22   Stroke Mother    Stroke Brother 68   Stomach cancer Neg Hx    Rectal cancer Neg Hx    Prostate cancer Neg Hx    Pancreatic cancer Neg Hx    Ovarian cancer Neg Hx     Social History   Socioeconomic History   Marital status: Married    Spouse name: Evelena Peat   Number of children: 9   Years of education: 8   Highest education level: Not on file  Occupational History   Occupation: retired  Tobacco Use   Smoking status: Former    Packs/day: 2.00    Years: 20.00     Total pack years: 40.00    Types: Cigarettes    Quit date: 04/08/1986    Years since quitting: 35.5   Smokeless tobacco: Current    Types: Chew   Tobacco comments:    Encouraged cessation  Vaping Use   Vaping Use: Never used  Substance and Sexual Activity   Alcohol use: No    Alcohol/week: 0.0 standard drinks of alcohol   Drug use: No   Sexual activity: Not on file  Other Topics Concern   Not on file  Social History Narrative    married, 5 sons 4 daughters. He is retired Horticulturist, commercial. 2-3 caffeinated beverages a day no alcohol   Social Determinants of Health   Financial Resource Strain: Low Risk  (04/15/2021)   Overall Financial Resource Strain (CARDIA)    Difficulty of Paying Living Expenses: Not hard at all  Food Insecurity: No Food Insecurity (04/15/2021)   Hunger Vital Sign    Worried About Running Out of Food in the Last Year: Never true    Ran Out of Food in the Last Year: Never true  Transportation Needs: No Transportation Needs (04/15/2021)   PRAPARE - Hydrologist (Medical): No    Lack of Transportation (Non-Medical): No  Physical Activity: Not on file  Stress: Not on file  Social Connections: Not on file  Intimate Partner Violence: Not on file    Review of Systems:  See HPI, all other systems reviewed and are negative.   Physical Exam: Vital signs in last 24 hours: Temp:  [98.2 F (36.8 C)] 98.2 F (36.8 C) (08/01 1351) Pulse Rate:  [88] 88 (08/01 1351) Resp:  [18] 18 (08/01 1351) BP: (129)/(47) 129/47 (08/01 1351) SpO2:  [92 %-96 %] 96 % (08/01 1400)   General:  Alert 88 year old male in NAD.  Head:  Normocephalic and atraumatic. Eyes:  No scleral icterus. Conjunctiva pink. Ears:  Normal auditory acuity. Nose:  No deformity, discharge or lesions. Mouth: Upper and lower dentures. No ulcers or lesions.  Neck:  Supple. No lymphadenopathy or thyromegaly.  Lungs: Breath sounds clear throughout.  Heart: Irregular rhythm, no  murmur.  Abdomen:  Soft, nontender. No masses.  Rectal: Deferred. Musculoskeletal:  Symmetrical without gross deformities.  Pulses:  Normal pulses noted. Extremities:  Without clubbing or edema. Neurologic:  Alert and  oriented x 4. No focal deficits.  Skin:  Intact without significant lesions or rashes. Psych:  Alert and cooperative. Normal mood and affect.  Intake/Output from previous day: No intake/output data recorded. Intake/Output this shift: No intake/output data recorded.  Lab Results: No results for input(s): "WBC", "HGB", "HCT", "PLT" in the last 72 hours. BMET No results for input(s): "NA", "K", "CL", "CO2", "GLUCOSE", "BUN", "CREATININE", "CALCIUM" in the last 72 hours. LFT No results for input(s): "PROT", "ALBUMIN", "AST", "ALT", "ALKPHOS", "BILITOT", "BILIDIR", "IBILI" in the last 72 hours. PT/INR No results for input(s): "LABPROT", "INR" in the last 72 hours. Hepatitis Panel No results for input(s): "HEPBSAG", "HCVAB", "HEPAIGM", "HEPBIGM" in the last 72 hours.    Studies/Results: No results found.  IMPRESSION/PLAN:  60) 88 year old male with a history of GERD, esophageal stricture with recurrent food bolus impaction in 2020 and 2021. He ate steak yesterday evening around 7 PM which became stuck in his upper esophagus.  He is spitting up his saliva, unable to drink fluids.  His most recent EGD was 04/07/2021 which showed benign appearing double ring GE junction stenosis which was dilated. -Stat CBC, CMP and INR -Urgent EGD to remove food bolus impaction. EGD benefits and risks discussed including risk with sedation, risk of bleeding, perforation and infection  -NPO -Protonix 40 mg IV x1 now -IV fluids per the ED physician -Further recommendations to be  determined after EGD completed  2) Atrial fibrillation on Eliquis, last dose taken at 737/31/2023  3) CHF, ischemic cardiomyopathy.  EF 40%.  4) History of CAD s/p CABG 2003   Patrecia Pour Kennedy-Smith   10/14/2021, 4:22PM   ------------------------------------------------------------------------------------  I have taken a history, reviewed the chart and examined the patient. I performed a substantive portion of this encounter, including complete performance of at least one of the key components, in conjunction with the APP. I agree with the APP's note, impression and recommendations  88 year old male with extensive cardiac history as above, presenting with esophageal food impaction as of yesterday evening.  Last dose of Eliquis was nearly 36 hours, should be low risk for bleeding, even with dilation. Plan for urgent EGD today to removed impacted food bolus and potential dilation.  The details, risks (including bleeding, perforation, infection, missed lesions, medication reactions and possible hospitalization or surgery if complications occur), benefits, and alternatives to EGD with possible biopsy and possible dilation were discussed with the patient and he consents to proceed.    Ivette Castronova E. Candis Schatz, MD [facility]astro[facility]

## 2021-10-15 ENCOUNTER — Other Ambulatory Visit: Payer: Self-pay | Admitting: Family Medicine

## 2021-10-15 DIAGNOSIS — Z7901 Long term (current) use of anticoagulants: Secondary | ICD-10-CM

## 2021-10-15 NOTE — Anesthesia Postprocedure Evaluation (Signed)
Anesthesia Post Note  Patient: Gregory [name]  Procedure(s) Performed: ESOPHAGOGASTRODUODENOSCOPY (EGD) WITH PROPOFOL FOREIGN BODY REMOVAL BALLOON DILATION     Patient location during evaluation: Endoscopy Anesthesia Type: General Level of consciousness: awake Pain management: pain level controlled Vital Signs Assessment: post-procedure vital signs reviewed and stable Respiratory status: spontaneous breathing, nonlabored ventilation, respiratory function stable and patient connected to nasal cannula oxygen Cardiovascular status: blood pressure returned to baseline and stable Postop Assessment: no apparent nausea or vomiting Anesthetic complications: no   No notable events documented.  Last Vitals:  Vitals:   10/14/21 2020 10/14/21 2030  BP: (!) 157/64 (!) 170/67  Pulse: 90 83  Resp: 13 14  Temp:    SpO2: 94% 95%    Last Pain:  Vitals:   10/14/21 2014  TempSrc: Oral  PainSc: 0-No pain                 Gregory [name]

## 2021-10-16 ENCOUNTER — Encounter (HOSPITAL_COMMUNITY): Payer: Self-pay | Admitting: Gastroenterology

## 2021-10-20 ENCOUNTER — Other Ambulatory Visit: Payer: Self-pay | Admitting: Cardiovascular Disease

## 2021-10-20 DIAGNOSIS — E039 Hypothyroidism, unspecified: Secondary | ICD-10-CM

## 2021-10-20 NOTE — Telephone Encounter (Signed)
Try to add him on to end of schedule at [facility] 8/15 EGD and balloon dilation  Need to obtain clearance to hold Eliquis day before and day of

## 2021-10-20 NOTE — Telephone Encounter (Signed)
PT is calling to schedule another EGD. Just had one on 10/14/2021. Food is still getting stuck, cant swallow. Esophagus was only stretched to 16 and was slightly torn. Please reach out to advise.

## 2021-10-20 NOTE — Telephone Encounter (Signed)
Please see note below. 

## 2021-10-20 NOTE — Telephone Encounter (Signed)
Pt daughter [name]  called in for pt stating that pt recently had an EGD done on 10/14/2021 and states that he feels that things are getting tight and foods are not  going down as easily now: Pt scheduled for an appointment to see Dr. Carlean Purl on 12/02/2021 at 2:10 by schedulers: Pt and Pt daughter are requesting that they be scheduled for an EGD and not an office visit: Please advise:

## 2021-10-20 NOTE — Telephone Encounter (Signed)
PT returning call to [facility]. Please call back to advise.

## 2021-10-20 NOTE — Telephone Encounter (Signed)
Spoke to charge nurse at [facility] stated that there is not enough time to follow last case for Dr. Carlean Purl on 10/28/2021:  Please advise

## 2021-10-21 ENCOUNTER — Other Ambulatory Visit: Payer: Self-pay

## 2021-10-21 ENCOUNTER — Telehealth: Payer: Self-pay

## 2021-10-21 DIAGNOSIS — R1319 Other dysphagia: Secondary | ICD-10-CM

## 2021-10-21 NOTE — Telephone Encounter (Signed)
Pt daughter [name] notified of Dr. Carlean Purl recommendations: [name] notified that we will contact them once Dr. Carlean Purl has availability to to perform EGD at [facility]: Clearance sent to pt Cardiologist for hold on Eliquis for 2 days:  [name] verbalized understanding with all questions answered.

## 2021-10-21 NOTE — Telephone Encounter (Signed)
[facility] Pre-operative Risk Assessment     Gregory [name] 1932/11/19 [id number]  Procedure: EGD with Balloon Dilation Anesthesia type:  MAC Procedure Date: TBD Provider: Dr. Carlean Purl  Type of Clearance needed: Pharmacy  Medication(s) needing held: Eliquis  Length of time for medication to be held: 2 days  Please review request and advise by either responding to this message or by sending your response to the fax # provided below.  Thank you,  [facility]  Phone: [phone] Fax: [phone] ATTENTION: Remo Lipps RN

## 2021-10-21 NOTE — Telephone Encounter (Signed)
Patient having dysphagia needing repeat dilation.  I spoke to the daughter and the patient.  He understands the risks, there is a remote history of clots when he was on warfarin and it was told years ago.  My plan is to hold the day before and the day of and resume the next day.  Sorry for the rush on this but I was able to get a spot for him on Friday of this week (August 11) so we are trying to get this turned around.  Thanks

## 2021-10-21 NOTE — Telephone Encounter (Addendum)
I am working on this  Procedure should be done at hospital given health status and age  I am trying to find a spot for him  We need to ask cardiology about holding Eliquis day before and day of procedure - so please do that  Patient needs to modify diet - no bread, chewy meats - soft, small, crumbly foods

## 2021-10-21 NOTE — Telephone Encounter (Signed)
I have them holding a slot at 1 PM on Friday, August 11 to do an EGD on Gregory [name].  Please place the orders and they will officially schedule it tomorrow.   Go ahead and tell them to hold his Eliquis the day before and day of as we planned.  If we do not get an answer from cardiology by tomorrow let me know but I think we really have no choice but to go ahead and hold and dilate.

## 2021-10-21 NOTE — Telephone Encounter (Signed)
Spoke to pt daughter: Documented in separate phone note

## 2021-10-21 NOTE — Telephone Encounter (Signed)
Patient with diagnosis of atrial fibrillation on Eliquis for anticoagulation.    Procedure: EGD with balloon dilation Date of procedure: 8/11   CHA2DS2-VASc Score = 7   This indicates a 11.2% annual risk of stroke. The patient's score is based upon: CHF History: 1 HTN History: 1 Diabetes History: 0 Stroke History: 2 Vascular Disease History: 1 Age Score: 2 Gender Score: 0    CrCl 35 Platelet count 165  Per office protocol, patient can hold Eliqus day before and day of procedure (this is the request of Dr. Carlean Purl in phone message dated 10/14/21)  Patient will not need bridging with Lovenox (enoxaparin) around procedure.  **This guidance is not considered finalized until pre-operative APP has relayed final recommendations.**

## 2021-10-21 NOTE — Telephone Encounter (Signed)
Left message for pt daughter [name] to call back:

## 2021-10-21 NOTE — Telephone Encounter (Signed)
PT daughter returning call. Please reach back out to advise. Thank you.

## 2021-10-22 ENCOUNTER — Other Ambulatory Visit: Payer: Self-pay

## 2021-10-22 DIAGNOSIS — R1319 Other dysphagia: Secondary | ICD-10-CM

## 2021-10-22 NOTE — Telephone Encounter (Signed)
Left detailed messages on home and mobile with a request for patient to call to discuss clearance for upcoming procedure.

## 2021-10-22 NOTE — Telephone Encounter (Signed)
Case ID number for EGD with Dilation [id number]

## 2021-10-22 NOTE — Telephone Encounter (Signed)
   Primary Cardiologist: Shelva Majestic, MD  Chart reviewed as part of pre-operative protocol coverage. Given past medical history and time since last visit, based on ACC/AHA guidelines, MARKEIS ALLMAN would be at acceptable risk for the planned procedure without further cardiovascular testing.   Per office protocol, patient can hold Eliqus day before and day of procedure (this is the request of Dr. Carlean Purl in phone message dated 10/14/21)  I will route this recommendation to the requesting party via [facility] fax function and remove from pre-op pool.  Please call with questions.  Emmaline Life, NP-C    10/22/2021, 11:48 AM [facility] [address] Office [phone] Fax [phone]

## 2021-10-22 NOTE — Telephone Encounter (Addendum)
Pt daughter [name] made aware of Dr. Carlean Purl recommendations: Pt was ordered and scheduled for an EGD with Dilation for 10/24/2021 with Dr. Carlean Purl at [facility] at 1:00 PM. Pt to arrive at 11:30, [name] made aware: Ambulatory GI referral placed in [facility] Given prep instructions verbally.  [name] notified for pt to hold Eliquis Thursday and Friday. [name] notified that they should receive a call from pt cardiologist office as well [name]  verbalized understanding with all questions answered.

## 2021-10-24 ENCOUNTER — Other Ambulatory Visit: Payer: Self-pay

## 2021-10-24 ENCOUNTER — Ambulatory Visit (HOSPITAL_BASED_OUTPATIENT_CLINIC_OR_DEPARTMENT_OTHER): Payer: Medicare Other | Admitting: Anesthesiology

## 2021-10-24 ENCOUNTER — Ambulatory Visit (HOSPITAL_COMMUNITY): Payer: Medicare Other | Admitting: Anesthesiology

## 2021-10-24 ENCOUNTER — Encounter (HOSPITAL_COMMUNITY): Payer: Self-pay | Admitting: Internal Medicine

## 2021-10-24 ENCOUNTER — Ambulatory Visit (HOSPITAL_COMMUNITY)
Admission: RE | Admit: 2021-10-24 | Discharge: 2021-10-24 | Disposition: A | Discharge status: Home / Self Care | Payer: Medicare Other | Attending: Internal Medicine | Admitting: Internal Medicine

## 2021-10-24 ENCOUNTER — Encounter (HOSPITAL_COMMUNITY)
Admission: RE | Disposition: A | Discharge status: Home / Self Care | Payer: Self-pay | Source: Home / Self Care | Attending: Internal Medicine

## 2021-10-24 DIAGNOSIS — N189 Chronic kidney disease, unspecified: Secondary | ICD-10-CM | POA: Diagnosis not present

## 2021-10-24 DIAGNOSIS — I482 Chronic atrial fibrillation, unspecified: Secondary | ICD-10-CM | POA: Diagnosis not present

## 2021-10-24 DIAGNOSIS — Z87891 Personal history of nicotine dependence: Secondary | ICD-10-CM | POA: Diagnosis not present

## 2021-10-24 DIAGNOSIS — M1990 Unspecified osteoarthritis, unspecified site: Secondary | ICD-10-CM | POA: Insufficient documentation

## 2021-10-24 DIAGNOSIS — K222 Esophageal obstruction: Secondary | ICD-10-CM | POA: Insufficient documentation

## 2021-10-24 DIAGNOSIS — I110 Hypertensive heart disease with heart failure: Secondary | ICD-10-CM | POA: Diagnosis not present

## 2021-10-24 DIAGNOSIS — Z951 Presence of aortocoronary bypass graft: Secondary | ICD-10-CM | POA: Diagnosis not present

## 2021-10-24 DIAGNOSIS — I739 Peripheral vascular disease, unspecified: Secondary | ICD-10-CM | POA: Diagnosis not present

## 2021-10-24 DIAGNOSIS — Z7901 Long term (current) use of anticoagulants: Secondary | ICD-10-CM | POA: Diagnosis not present

## 2021-10-24 DIAGNOSIS — I69398 Other sequelae of cerebral infarction: Secondary | ICD-10-CM | POA: Insufficient documentation

## 2021-10-24 DIAGNOSIS — Z79899 Other long term (current) drug therapy: Secondary | ICD-10-CM | POA: Insufficient documentation

## 2021-10-24 DIAGNOSIS — N1831 Chronic kidney disease, stage 3a: Secondary | ICD-10-CM

## 2021-10-24 DIAGNOSIS — M21372 Foot drop, left foot: Secondary | ICD-10-CM | POA: Diagnosis not present

## 2021-10-24 DIAGNOSIS — I2510 Atherosclerotic heart disease of native coronary artery without angina pectoris: Secondary | ICD-10-CM | POA: Insufficient documentation

## 2021-10-24 DIAGNOSIS — I130 Hypertensive heart and chronic kidney disease with heart failure and stage 1 through stage 4 chronic kidney disease, or unspecified chronic kidney disease: Secondary | ICD-10-CM | POA: Diagnosis not present

## 2021-10-24 DIAGNOSIS — K449 Diaphragmatic hernia without obstruction or gangrene: Secondary | ICD-10-CM | POA: Diagnosis not present

## 2021-10-24 DIAGNOSIS — I5032 Chronic diastolic (congestive) heart failure: Secondary | ICD-10-CM | POA: Diagnosis not present

## 2021-10-24 DIAGNOSIS — E039 Hypothyroidism, unspecified: Secondary | ICD-10-CM | POA: Insufficient documentation

## 2021-10-24 DIAGNOSIS — I4891 Unspecified atrial fibrillation: Secondary | ICD-10-CM | POA: Diagnosis not present

## 2021-10-24 DIAGNOSIS — I5022 Chronic systolic (congestive) heart failure: Secondary | ICD-10-CM | POA: Diagnosis not present

## 2021-10-24 DIAGNOSIS — R1310 Dysphagia, unspecified: Secondary | ICD-10-CM | POA: Diagnosis not present

## 2021-10-24 DIAGNOSIS — R1319 Other dysphagia: Secondary | ICD-10-CM

## 2021-10-24 DIAGNOSIS — I255 Ischemic cardiomyopathy: Secondary | ICD-10-CM | POA: Insufficient documentation

## 2021-10-24 DIAGNOSIS — K219 Gastro-esophageal reflux disease without esophagitis: Secondary | ICD-10-CM | POA: Diagnosis not present

## 2021-10-24 HISTORY — PX: BALLOON DILATION: SHX5330

## 2021-10-24 HISTORY — PX: ESOPHAGOGASTRODUODENOSCOPY (EGD) WITH PROPOFOL: SHX5813

## 2021-10-24 SURGERY — ESOPHAGOGASTRODUODENOSCOPY (EGD) WITH PROPOFOL
Anesthesia: Monitor Anesthesia Care

## 2021-10-24 MED ORDER — SODIUM CHLORIDE 0.9 % IV SOLN: INTRAVENOUS | Status: DC

## 2021-10-24 MED ORDER — LIDOCAINE 2% (20 MG/ML) 5 ML SYRINGE
INTRAMUSCULAR | Status: DC | PRN
  Administered 2021-10-24: 60 mg via INTRAVENOUS

## 2021-10-24 MED ORDER — PROPOFOL 10 MG/ML IV BOLUS
INTRAVENOUS | Status: AC
  Filled 2021-10-24: qty 20

## 2021-10-24 MED ORDER — LACTATED RINGERS IV SOLN
INTRAVENOUS | Status: DC
  Administered 2021-10-24: 1000 mL via INTRAVENOUS

## 2021-10-24 MED ORDER — PROPOFOL 10 MG/ML IV BOLUS
INTRAVENOUS | Status: DC | PRN
  Administered 2021-10-24: 20 mg via INTRAVENOUS
  Administered 2021-10-24 (×2): 30 mg via INTRAVENOUS
  Administered 2021-10-24: 20 mg via INTRAVENOUS

## 2021-10-24 SURGICAL SUPPLY — 15 items

## 2021-10-24 NOTE — Anesthesia Postprocedure Evaluation (Signed)
Anesthesia Post Note  Patient: Gregory [name]  Procedure(s) Performed: ESOPHAGOGASTRODUODENOSCOPY (EGD) WITH PROPOFOL BALLOON DILATION     Patient location during evaluation: Endoscopy Anesthesia Type: MAC Level of consciousness: awake and alert Pain management: pain level controlled Vital Signs Assessment: post-procedure vital signs reviewed and stable Respiratory status: spontaneous breathing, nonlabored ventilation and respiratory function stable Cardiovascular status: stable and blood pressure returned to baseline Postop Assessment: no apparent nausea or vomiting Anesthetic complications: no   No notable events documented.  Last Vitals:  Vitals:   10/24/21 1320 10/24/21 1330  BP: (!) 130/47 (!) 134/59  Pulse: 68 62  Resp: 19 (!) 21  Temp:    SpO2: 98% 99%    Last Pain:  Vitals:   10/24/21 1330  TempSrc:   PainSc: 0-No pain                 Jadden Yim,W. EDMOND

## 2021-10-24 NOTE — Discharge Instructions (Signed)
Stricture dilated to 18 mm  Restart Eliquis tomorrow.  Keep September appointment (office)  Gatha Mayer, MD, [facility]   YOU HAD AN ENDOSCOPIC PROCEDURE TODAY: Refer to the procedure report and other information in the discharge instructions given to you for any specific questions about what was found during the examination. If this information does not answer your questions, please call Dr. Celesta Aver office at [phone] to clarify.   YOU SHOULD EXPECT: Some feelings of bloating in the abdomen. Passage of more gas than usual. Walking can help get rid of the air that was put into your GI tract during the procedure and reduce the bloating. If you had a lower endoscopy (such as a colonoscopy or flexible sigmoidoscopy) you may notice spotting of blood in your stool or on the toilet paper. Some abdominal soreness may be present for a day or two, also.  DIET:   Clear liquids until 2 PM  Soft foods then  Normal food consistency tomorrow .   ACTIVITY: Your care partner should take you home directly after the procedure. You should plan to take it easy, moving slowly for the rest of the day. You can resume normal activity the day after the procedure however YOU SHOULD NOT DRIVE, use power tools, machinery or perform tasks that involve climbing or major physical exertion for 24 hours (because of the sedation medicines used during the test).   SYMPTOMS TO REPORT IMMEDIATELY: A gastroenterologist can be reached at any hour. Please call [phone]  for any of the following symptoms:   Following upper endoscopy (EGD, EUS, ERCP, esophageal dilation) Vomiting of blood or coffee ground material  New, significant abdominal pain  New, significant chest pain or pain under the shoulder blades  Painful or persistently difficult swallowing  New shortness of breath  Black, tarry-looking or red, bloody stools  FOLLOW UP:  If any biopsies were taken you will be contacted by phone or by letter within the  next 1-3 weeks. Call [phone]  if you have not heard about the biopsies in 3 weeks.  Please also call with any specific questions about appointments or follow up tests.

## 2021-10-24 NOTE — Anesthesia Preprocedure Evaluation (Addendum)
Anesthesia Evaluation  Patient identified by MRN, date of birth, ID band Patient awake    Reviewed: Allergy & Precautions, NPO status , Patient's Chart, lab work & pertinent test results, reviewed documented beta blocker date and time   Airway Mallampati: III  TM Distance: >3 FB Neck ROM: Full    Dental  (+) Edentulous Upper, Edentulous Lower   Pulmonary former smoker,    Pulmonary exam normal breath sounds clear to auscultation       Cardiovascular hypertension (134/52 in preop), Pt. on medications and Pt. on home beta blockers + CAD, + CABG (1993), + Peripheral Vascular Disease and +CHF (LVEF 01%, grade 1 diastolic dysfunction)  + dysrhythmias (eliquis LD 3d ago) Atrial Fibrillation + Valvular Problems/Murmurs (mild MR) MR  Rhythm:Irregular Rate:Normal  Echo 03/2021 1. Left ventricular ejection fraction by 3D volume is 42 %. The left ventricle has moderately decreased function. The left ventricle has no regional wall motion abnormalities. There is mild concentric left ventricular hypertrophy. Left ventricular diastolic parameters are consistent with Grade I diastolic dysfunction (impaired relaxation).  2. Right ventricular systolic function is normal. The right ventricular size is normal.  3. Left atrial size was severely dilated.  4. The mitral valve is normal in structure. Mild mitral valve  regurgitation. No evidence of mitral stenosis.  5. The aortic valve is normal in structure. Aortic valve regurgitation is mild. Aortic valve sclerosis is present, with no evidence of aortic valve stenosis. Aortic regurgitation PHT measures 670 msec.  6. There is borderline dilatation of the aortic root, measuring 37 mm.  7. The inferior vena cava is normal in size with greater than 50% respiratory variability, suggesting right atrial pressure of 3 mmHg.    Neuro/Psych CVA (L foot drop), Residual Symptoms negative psych ROS    GI/Hepatic Neg liver ROS, GERD  Controlled and Medicated,Dysphagia, hx esophageal strictures and food impactions    Endo/Other  Hypothyroidism   Renal/GU Renal Insufficiency and CRFRenal diseaseCr 1.71  negative genitourinary   Musculoskeletal  (+) Arthritis , Osteoarthritis,    Abdominal   Peds  Hematology negative hematology ROS (+) Hb 12.9, plt 165   Anesthesia Other Findings   Reproductive/Obstetrics negative OB ROS                            Anesthesia Physical Anesthesia Plan  ASA: 3  Anesthesia Plan: MAC   Post-op Pain Management:    Induction:   PONV Risk Score and Plan: Propofol infusion, TIVA and Treatment may vary due to age or medical condition  Airway Management Planned: Natural Airway and Simple Face Mask  Additional Equipment: None  Intra-op Plan:   Post-operative Plan:   Informed Consent: I have reviewed the patients History and Physical, chart, labs and discussed the procedure including the risks, benefits and alternatives for the proposed anesthesia with the patient or authorized representative who has indicated his/her understanding and acceptance.     Dental advisory given  Plan Discussed with: CRNA  Anesthesia Plan Comments:        Anesthesia Quick Evaluation

## 2021-10-24 NOTE — Transfer of Care (Signed)
Immediate Anesthesia Transfer of Care Note  Patient: Gregory [name]  Procedure(s) Performed: ESOPHAGOGASTRODUODENOSCOPY (EGD) WITH PROPOFOL BALLOON DILATION  Patient Location: PACU  Anesthesia Type:MAC  Level of Consciousness: awake, alert  and oriented  Airway & Oxygen Therapy: Patient Spontanous Breathing and Patient connected to face mask oxygen  Post-op Assessment: Report given to RN and Post -op Vital signs reviewed and stable  Post vital signs: Reviewed and stable  Last Vitals:  Vitals Value Taken Time  BP    Temp    Pulse    Resp    SpO2      Last Pain:  Vitals:   10/24/21 1149  TempSrc: Temporal  PainSc: 0-No pain         Complications: No notable events documented.

## 2021-10-24 NOTE — Interval H&P Note (Signed)
History and Physical Interval Note:  10/24/2021 12:41 PM  Gregory [name]  has presented today for surgery, with the diagnosis of Dysphagia.  The various methods of treatment have been discussed with the patient and family. After consideration of risks, benefits and other options for treatment, the patient has consented to  Procedure(s): ESOPHAGOGASTRODUODENOSCOPY (EGD) WITH PROPOFOL (N/A) BALLOON DILATION (N/A) as a surgical intervention.  The patient's history has been reviewed, patient examined, no change in status, stable for surgery.  I have reviewed the patient's chart and labs.  Questions were answered to the patient's satisfaction.    Having persistent dysphagia - better - but for additional dilation. Eliquis - last dose 8/9 Silvano Rusk

## 2021-10-24 NOTE — Op Note (Signed)
[facility] Patient Name: Gregory [name] Procedure Date: 10/24/2021 MRN: [id number] Attending MD: Gatha Mayer , MD Date of Birth: 20-Dec-1932 CSN: [id number] Age: 88 Admit Type: Inpatient Procedure:                Upper GI endoscopy Indications:              Dysphagia, Stricture of the esophagus, For therapy                            of esophageal stricture Providers:                Gatha Mayer, MD, Ervin Knack, RN, [facility]                            Technician, Technician Referring MD:              Medicines:                Monitored Anesthesia Care Complications:            No immediate complications. Estimated Blood Loss:     Estimated blood loss was minimal. Procedure:                Pre-Anesthesia Assessment:                           - Prior to the procedure, a History and Physical                            was performed, and patient medications and                            allergies were reviewed. The patient's tolerance of                            previous anesthesia was also reviewed. The risks                            and benefits of the procedure and the sedation                            options and risks were discussed with the patient.                            All questions were answered, and informed consent                            was obtained. Prior Anticoagulants: The patient                            last took Eliquis (apixaban) 2 days prior to the                            procedure. ASA Grade Assessment: III - A patient  with severe systemic disease. After reviewing the                            risks and benefits, the patient was deemed in                            satisfactory condition to undergo the procedure.                           After obtaining informed consent, the endoscope was                            passed under direct vision. Throughout the                             procedure, the patient's blood pressure, pulse, and                            oxygen saturations were monitored continuously. The                            [id number]-H190 ([id number] Olympus endoscope was introduced                            through the mouth, and advanced to the second part                            of duodenum. The upper GI endoscopy was                            accomplished without difficulty. The patient                            tolerated the procedure well. Scope In: Scope Out: Findings:      One benign-appearing, intrinsic moderate (circumferential scarring or       stenosis; an endoscope may pass) stenosis was found in the distal       esophagus. This stenosis measured 1.5 cm (inner diameter). The stenosis       was traversed. A TTS dilator was passed through the scope. Dilation with       a 15-16.5-18 mm balloon dilator was performed to 18 mm. The dilation       site was examined and showed moderate mucosal disruption. Estimated       blood loss was minimal.      A small hiatal hernia was present.      The cardia and gastric fundus were normal on retroflexion.      The exam was otherwise without abnormality. Impression:               - Benign-appearing esophageal stenosis. Dilated.                           - Small hiatal hernia.                           - The  examination was otherwise normal.                           - No specimens collected. Moderate Sedation:      Not Applicable - Patient had care per Anesthesia. Recommendation:           - Patient has a contact number available for                            emergencies. The signs and symptoms of potential                            delayed complications were discussed with the                            patient. Return to normal activities tomorrow.                            Written discharge instructions were provided to the                            patient.                           - Clear liquids  x 1 hour then soft foods rest of                            day. Start prior diet tomorrow.                           - Continue present medications.                           - Resume Eliquis (apixaban) at prior dose tomorrow.                           - keep follow-up as planned in Sept Procedure Code(s):        --- Professional ---                           [phone], Esophagogastroduodenoscopy, flexible,                            transoral; with transendoscopic balloon dilation of                            esophagus (less than 30 mm diameter) Diagnosis Code(s):        --- Professional ---                           K22.2, Esophageal obstruction                           K44.9, Diaphragmatic hernia without obstruction or  gangrene                           R13.10, Dysphagia, unspecified CPT copyright 2019 [employer] Association. All rights reserved. The codes documented in this report are preliminary and upon coder review may  be revised to meet current compliance requirements. Gatha Mayer, MD 10/24/2021 1:09:36 PM This report has been signed electronically. Number of Addenda: 0

## 2021-10-25 ENCOUNTER — Encounter (HOSPITAL_COMMUNITY): Payer: Self-pay | Admitting: Internal Medicine

## 2021-10-27 ENCOUNTER — Telehealth: Payer: Self-pay | Admitting: Cardiovascular Disease

## 2021-10-27 NOTE — Telephone Encounter (Signed)
Called pt to let him know samples are placed at the front desk. Spoke with pt's daughter, verbalized understanding. Will come pick them up.

## 2021-10-27 NOTE — Telephone Encounter (Signed)
Patient calling the office for samples of medication:   1.  What medication and dosage are you requesting samples for? Eliquis  2.  Are you currently out of this medication? Just a few left, in the donut hole

## 2021-11-03 DIAGNOSIS — M48061 Spinal stenosis, lumbar region without neurogenic claudication: Secondary | ICD-10-CM | POA: Diagnosis not present

## 2021-11-05 ENCOUNTER — Other Ambulatory Visit: Payer: Self-pay | Admitting: Cardiovascular Disease

## 2021-11-07 ENCOUNTER — Telehealth: Payer: Self-pay | Admitting: Pharmacist

## 2021-11-07 NOTE — Telephone Encounter (Signed)
Received the message below regarding patient needing assistance with Eliquis cost.  Spoke with patient's daughter. Patient currently has 30 days of Eliquis 2.'5mg'$ .  Screened for medication assistance program. Patient should qualify based on reported household income of < 300% FPL.  Mailed application for Eliquis medication assistance program to patient.  Daughter is aware to include proof of household income and proof of out of pocket prescription expense of 3% or more of household income.  Also included 30 days free coupon cards. Daughter is aware they can only use these once per lifetime and will not be about to use if they have had one in the past.

## 2021-11-10 ENCOUNTER — Other Ambulatory Visit: Payer: Self-pay | Admitting: Cardiovascular Disease

## 2021-11-10 DIAGNOSIS — I255 Ischemic cardiomyopathy: Secondary | ICD-10-CM

## 2021-11-12 DIAGNOSIS — M545 Low back pain, unspecified: Secondary | ICD-10-CM | POA: Diagnosis not present

## 2021-11-19 DIAGNOSIS — N2581 Secondary hyperparathyroidism of renal origin: Secondary | ICD-10-CM | POA: Diagnosis not present

## 2021-11-19 DIAGNOSIS — D631 Anemia in chronic kidney disease: Secondary | ICD-10-CM | POA: Diagnosis not present

## 2021-11-19 DIAGNOSIS — N183 Chronic kidney disease, stage 3 unspecified: Secondary | ICD-10-CM | POA: Diagnosis not present

## 2021-11-19 DIAGNOSIS — I129 Hypertensive chronic kidney disease with stage 1 through stage 4 chronic kidney disease, or unspecified chronic kidney disease: Secondary | ICD-10-CM | POA: Diagnosis not present

## 2021-11-19 LAB — COMPREHENSIVE METABOLIC PANEL
Albumin: 4.1 (ref 3.5–5.0)
Calcium: 9.9 (ref 8.7–10.7)
eGFR: 41

## 2021-11-19 LAB — BASIC METABOLIC PANEL
BUN: 43 — AB (ref 4–21)
CO2: 27 — AB (ref 13–22)
Chloride: 106 (ref 99–108)
Creatinine: 1.6 — AB (ref 0.6–1.3)
Glucose: 119
Potassium: 4.9 mEq/L (ref 3.5–5.1)
Sodium: 141 (ref 137–147)

## 2021-11-19 LAB — VITAMIN D 25 HYDROXY (VIT D DEFICIENCY, FRACTURES): Vit D, 25-Hydroxy: 45.2

## 2021-11-24 ENCOUNTER — Telehealth: Payer: Self-pay | Admitting: *Deleted

## 2021-11-24 NOTE — Chronic Care Management (AMB) (Signed)
  Care Coordination Note  11/24/2021 Name: FATEH KINDLE MRN: [id number] DOB: March 04, 1933  LAVONE WEISEL is a 88 y.o. year old male who is a primary care patient of [name], [name], MD and is actively engaged with the care management team. I reached out to [name] by phone today to assist with re-scheduling a follow up visit with the Pharmacist  Follow up plan: Telephone appointment with care management team member scheduled for:12/17/21  [place]: [phone]

## 2021-11-27 ENCOUNTER — Telehealth: Payer: Medicare Other

## 2021-12-01 DIAGNOSIS — M48061 Spinal stenosis, lumbar region without neurogenic claudication: Secondary | ICD-10-CM | POA: Diagnosis not present

## 2021-12-02 ENCOUNTER — Ambulatory Visit: Payer: Medicare Other | Admitting: Internal Medicine

## 2021-12-02 ENCOUNTER — Encounter: Payer: Self-pay | Admitting: Internal Medicine

## 2021-12-02 VITALS — BP 108/58 | HR 68 | Ht 66.0 in | Wt 180.8 lb

## 2021-12-02 DIAGNOSIS — K219 Gastro-esophageal reflux disease without esophagitis: Secondary | ICD-10-CM

## 2021-12-02 DIAGNOSIS — K222 Esophageal obstruction: Secondary | ICD-10-CM

## 2021-12-02 NOTE — Patient Instructions (Signed)
Glad you are swallowing ok.  If you start to have trouble again please call us and let me know as I might have to stretch it again.   Continue current medications.  I appreciate the opportunity to care for you. Gatha Mayer, MD, [facility]

## 2021-12-02 NOTE — Progress Notes (Signed)
Gregory [name] 88 y.o. [birth year] [id number]  Assessment & Plan:   Encounter Diagnosis  Name Primary?   GERD with stricture Yes   He is improved.  He will follow-up as needed and continue twice daily PPI.  I advised him to contact us should dysphagia return.  Would like to avoid repeat food impaction if possible.    Subjective:   Chief Complaint: Follow-up of dysphagia and GERD, stricture  HPI 88 year old white man with a history of GERD and esophageal stricture with recurrent need for esophageal dilation and history of multiple food impactions, presents for follow-up after upper GI endoscopy with dilation to 18 mm, 10/24/2021.  He had had a food impaction on 10/14/2021 and a dilation by Dr. Candis Schatz to 16.5 mm, of his distal esophageal stricture.  There is an associated small hiatal hernia.  His daughter is with him today.  He is not having any dysphagia problems.  He is taking his PPI therapy.  Other health problems include ischemic cardiomyopathy, long-term use of Eliquis for for A-fib, prior CABG, stroke. Allergies  Allergen Reactions   Percocet [Oxycodone-Acetaminophen] Itching and Nausea Only   Current Meds  Medication Sig   acetaminophen (TYLENOL) 500 MG tablet Take 500 mg by mouth every 6 (six) hours as needed for moderate pain.   carvedilol (COREG) 3.125 MG tablet TAKE 1 TABLET(3.125 MG) BY MOUTH TWICE DAILY WITH A MEAL   ELIQUIS 2.5 MG TABS tablet TAKE 1 TABLET(2.5 MG) BY MOUTH TWICE DAILY   ezetimibe (ZETIA) 10 MG tablet TAKE 1 TABLET(10 MG) BY MOUTH DAILY (Patient taking differently: Take 10 mg by mouth daily.)   gabapentin (NEURONTIN) 100 MG capsule Take 100 mg by mouth at bedtime as needed (nerve pain.).   levothyroxine (SYNTHROID) 25 MCG tablet TAKE 1 TABLET(25 MCG) BY MOUTH DAILY BEFORE BREAKFAST   Melatonin 10 MG TABS Take 10 mg by mouth at bedtime as needed (sleep).   pantoprazole (PROTONIX) 40 MG tablet TAKE 1 TABLET(40 MG) BY MOUTH TWICE DAILY (Patient  taking differently: Take 40 mg by mouth 2 (two) times daily.)   rOPINIRole (REQUIP) 0.25 MG tablet TAKE 1 TABLET(0.25 MG) BY MOUTH TWICE DAILY   tiZANidine (ZANAFLEX) 4 MG tablet TAKE 1 TABLET(4 MG) BY MOUTH EVERY 8 HOURS AS NEEDED FOR MUSCLE SPASMS. DO NOT COMBINE WITH TRAMADOL (Patient taking differently: Take 4 mg by mouth at bedtime as needed for muscle spasms.)   traMADol (ULTRAM) 50 MG tablet TAKE 1/2 TABLET BY MOUTH EVERY 8 HOURS AS NEEDED FOR PAIN   Past Medical History:  Diagnosis Date   Acute respiratory failure ([place])    Appendicitis with abscess 03/31/2011   Arthritis    CAD (coronary artery disease),CABG 1993-LIMA to the LAD, SVG to Om and SVG to PDA/PLA, patent on cath 2008. 2003   a. s/p CABG 2003. b. last cath 2008 with patent grafts.   Chronic atrial fibrillation ([facility])    permanent   Chronic systolic CHF (congestive heart failure) ([place])    a. Prior low EF, later normalized.   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 07/2015 -  after holding Xarelto x 3 days for spinal procedure.   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 felt vasovagal in setting of pain from renal infarct.   Past Surgical History:  Procedure Laterality Date   BALLOON DILATION N/A 05/22/2015   Procedure: BALLOON DILATION;  Surgeon: Gatha Mayer, MD;  Location: [facility] ENDOSCOPY;  Service: Endoscopy;  Laterality: N/A;   BALLOON DILATION N/A 12/15/2018   Procedure: BALLOON DILATION;  Surgeon: Gatha Mayer, MD;  Location: [facility];  Service:  Endoscopy;  Laterality: N/A;   BALLOON DILATION N/A 10/14/2021   Procedure: BALLOON DILATION;  Surgeon: Daryel November, MD;  Location: [facility];  Service: Gastroenterology;  Laterality: N/A;   BALLOON DILATION N/A 10/24/2021   Procedure: BALLOON DILATION;  Surgeon: Gatha Mayer, MD;  Location: [facility];  Service: Gastroenterology;  Laterality: N/A;   CARDIAC CATHETERIZATION  02/24/2002   reduced LV function, 60-70% prox RCA stenosis, 70% PLA ostial stenosis, 80% secondary branch of PLA stenosis - subsequent CABG (Dr. Jackie Plum)   Carotid Doppler  06/2012   50-69% right bulb/prox ICA diameter reduction; 70-99% left bulb/prox ICA diameter reduction   CATARACT EXTRACTION W/ INTRAOCULAR LENS IMPLANT Bilateral 2013   COLONOSCOPY N/A 05/18/2016   Procedure: COLONOSCOPY;  Surgeon: Manus Gunning, MD;  Location: [facility] ENDOSCOPY;  Service: Gastroenterology;  Laterality: N/A;   CORONARY ANGIOPLASTY  07/05/2006   3 vessel CAD, patent LIMA to LAD, patentVG to OM, patent SVG to PDA & PLA, mild MR, severe LV systolic dysfunction (Dr. Gerrie Nordmann)   CORONARY ARTERY BYPASS GRAFT  03/01/2002   LIMA to LAD, reverse SVG to OM, reverse SVG to PDA of RCA, reverse SVG to PLA of RCA, ligation of LA appendage (Dr. Servando Snare)   ENTEROSCOPY N/A 05/20/2016   Procedure: ENTEROSCOPY;  Surgeon: Manus Gunning, MD;  Location: [place];  Service: Gastroenterology;  Laterality: N/A;   ESOPHAGOGASTRODUODENOSCOPY  02/01/2012   Procedure: ESOPHAGOGASTRODUODENOSCOPY (EGD);  Surgeon: Beryle Beams, MD;  Location: [facility];  Service: Endoscopy;  Laterality: N/A;   ESOPHAGOGASTRODUODENOSCOPY N/A 05/22/2015   Procedure: ESOPHAGOGASTRODUODENOSCOPY (EGD);  Surgeon: Gatha Mayer, MD;  Location: [facility];  Service: Endoscopy;  Laterality: N/A;   ESOPHAGOGASTRODUODENOSCOPY N/A 05/17/2016   Procedure: ESOPHAGOGASTRODUODENOSCOPY (EGD);  Surgeon: Juanita Craver, MD;  Location[facility] ENDOSCOPY;  Service: Endoscopy;   Laterality: N/A;   ESOPHAGOGASTRODUODENOSCOPY N/A 09/17/2017   Procedure: ESOPHAGOGASTRODUODENOSCOPY (EGD);  Surgeon: Milus Banister, MD;  Location: [facility] ENDOSCOPY;  Service: Endoscopy;  Laterality: N/A;   ESOPHAGOGASTRODUODENOSCOPY Left 02/09/2020   Procedure: ESOPHAGOGASTRODUODENOSCOPY (EGD);  Surgeon: Lavena Bullion, DO;  Location[facility] ENDOSCOPY;  Service: Gastroenterology;  Laterality: Left;   ESOPHAGOGASTRODUODENOSCOPY (EGD) WITH PROPOFOL N/A 12/15/2018   Procedure: ESOPHAGOGASTRODUODENOSCOPY (EGD) WITH PROPOFOL;  Surgeon: Gatha Mayer, MD;  [facility]: [facility];  Service: Endoscopy;  Laterality: N/A;   ESOPHAGOGASTRODUODENOSCOPY (EGD) WITH PROPOFOL N/A 04/07/2021   Procedure: ESOPHAGOGASTRODUODENOSCOPY (EGD) WITH PROPOFOL;  Surgeon: Milus Banister, MD;  Location: [facility] END[facility];  Service: Endoscopy;  Laterality: N/A;   ESOPHAGOGASTRODUODENOSCOPY (EGD) WITH PROPOFOL N/A 10/14/2021   Procedure: ESOPHAGOGASTRODUODENOSCOPY (EGD) WITH PROPOFOL;  Surgeon: Daryel November, MD;  Location: [facility];  Service: Gastroenterology;  Laterality: N/A;   ESOPHAGOGASTRODUODENOSCOPY (EGD) WITH PROPOFOL N/A 10/24/2021   Procedure: ESOPHAGOGASTRODUODENOSCOPY (EGD) WITH PROPOFOL;  Surgeon: Gatha Mayer, MD;  Location: [facility];  Service: Gastroenterology;  Laterality: N/A;   FOREIGN BODY REMOVAL  09/17/2017   Procedure: FOREIGN BODY REMOVAL;  Surgeon: Milus Banister, MD;  Location: [facility] ENDOSCOPY;  Service: Endoscopy;;   FOREIGN BODY REMOVAL  12/15/2018   Procedure: FOREIGN BODY REMOVAL;  Surgeon: Gatha Mayer, MD;  Location: [facility];  Service: Endoscopy;;   FOREIGN BODY REMOVAL  02/09/2020   Procedure: FOREIGN BODY REMOVAL;  Surgeon: Lavena Bullion, DO;  Location: [place];  Service: Gastroenterology;;   FOREIGN BODY REMOVAL  10/14/2021   Procedure: FOREIGN BODY REMOVAL;  Surgeon: Daryel November, MD;  Location: [facility] END[facility];  Service: Gastroenterology;;   GIVENS CAPSULE STUDY  N/A 05/18/2016   Procedure: GIVENS CAPSULE STUDY;  Surgeon: Manus Gunning, MD;  Location: [place];  Service: Gastroenterology;  Laterality: N/A;   LAPAROSCOPIC APPENDECTOMY  03/30/2011   Procedure: APPENDECTOMY LAPAROSCOPIC;  Surgeon: Joyice Faster. Cornett, MD;  Location: [place];  Service: General;  Laterality: N/A;   LEFT HEART CATHETERIZATION WITH CORONARY ANGIOGRAM N/A 01/24/2013   Procedure: LEFT HEART CATHETERIZATION WITH CORONARY ANGIOGRAM;  Surgeon: [name], MD;  Location: [facility] CATH LAB;  Service: Cardiovascular;  Laterality: N/A;  Right heart with grafts   LITHOTRIPSY     "once" (01/21/2013)   [place]  05/2009   persantine myoview - fixed moderate perfusion defect in inferior wall & lateral segment of apex (poor non-transmural infarction), minimal anterolateral periinfarct reversible ischemia seen, abnormal study, defects similar to 2006 study   RIGHT/LEFT HEART CATH AND CORONARY ANGIOGRAPHY N/A 09/17/2017   Procedure: RIGHT/LEFT HEART CATH AND CORONARY ANGIOGRAPHY;  Surgeon: Martinique, Peter M, MD;  Location: [place] CV LAB;  Service: Cardiovascular;  Laterality: N/A;   SPLENECTOMY, TOTAL  01/2013   TEE WITHOUT CARDIOVERSION N/A 01/23/2013   Procedure: TRANSESOPHAGEAL ECHOCARDIOGRAM (TEE);  Surgeon: Sanda Klein, MD;  Location: [facility] ENDOSCOPY;  Service: Cardiovascular;  Laterality: N/A;   TRANSTHORACIC ECHOCARDIOGRAM  06/23/2012   EF 40-45%, mild LVH; mild AV regurg; mild MV regurg; LV mod-severely dilated; RV mildly dilated; systolic pressure borderline increased; RA mod dilated   Social History   Social History Narrative    married, 5 sons 4 daughters. He is retired Horticulturist, commercial. 2-3 caffeinated beverages a day no alcohol   family history includes Heart attack (age of onset: 72) in his brother; Heart disease in his father; Stroke in his mother; Stroke (age of onset: 39) in his brother.   Review of Systems  As above Objective:   Physical Exam BP (!)  108/58   Pulse 68   Ht '5\' 6"'$  (1.676 m)   Wt 180 lb 12.8 oz (82 kg)   SpO2 98%   BMI 29.18 kg/m

## 2021-12-08 NOTE — Progress Notes (Addendum)
[facility] [employer] [name], [place], [place] [address] [phone]  Date:  12/10/2021   Name:  Gregory [name]   DOB:  Jul 28, 1932   MRN:  [id number]  PCP:  Darreld Mclean, MD    Chief Complaint: 6 month follow up (Concerns/ questions: none/Flu shot today: yes)   History of Present Illness:  Gregory [name] is a 88 y.o. very pleasant male patient who presents with the following:  Patient is seen today for periodic follow-up Most recent visit with myself was in [place] that time he had recently been hospitalized for acute on chronic CHF Patient with history of CAD status post CABG 2003, A. 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  Just prior to his CHF admission he was admitted for EGD with dilation  He had a repeat esophageal dilation and removal of foreign body in esophagus on August first and 11th He since followed up in the office with his gastroenterologist, Dr. Arelia Longest on September 19-doing better, twice daily PPI He got new dentures which does help him  Flu vaccine- give today Recommend COVID and Shingrix vaccines  He notes his breathing is ok He is able to do his outdoor work such as taking care of his chickens, etc Denies any abd pain, he is able to eat ok Encouraged him him to avoid chewy or hard to swallow foods which might be likely to exacerbate his esophageal stricture  Carvedilol 3.125 twice daily Eliquis 2.5 twice daily Zetia 10 daily Lasix 20- not taking now, nephology stopped this  Levothyroxine 25 Ropinirole Crestor  He has gained significant weight since August-however, he likely lost weight at that time due to esophageal stricture.  He  appears euvolemic and ankles are not edematous Wt Readings from Last 3 Encounters:  12/10/21 184 lb (83.5 kg)  12/02/21 180 lb 12.8 oz (82 kg)  10/24/21 169 lb 15.6 oz (77.1 kg)   One year ago 183lb Patient Active Problem List   Diagnosis Date Noted   Fever 04/17/2021   Protein-calorie malnutrition, severe 04/15/2021   Pressure injury of sacral region, stage 1 04/14/2021   Acute on chronic systolic CHF (congestive heart failure) ([place]) 04/13/2021   Chronic kidney disease, stage 3a ([place]) 04/13/2021   Chronic diastolic CHF (congestive heart failure) ([place]) 04/06/2021   Essential hypertension 04/06/2021   Mixed hyperlipidemia 04/06/2021   Elevated liver enzymes 04/06/2021   Anemia, unspecified 04/06/2021   Hypothyroidism 04/06/2021   Restless leg syndrome 04/06/2021   Failure to thrive in adult 04/06/2021   Other cirrhosis of liver ([place]) 04/06/2021   GERD without esophagitis 01/26/2019   Hypothyroid 12/12/2018   Prediabetes 12/04/2018   Chronic anemia 01/13/2016   Chronic renal failure, stage 3 (moderate) ([place]) 08/06/2015   Vasovagal syncope    Renal infarction ([place]) 07/31/2015   Syncope 07/30/2015   Esophageal dysphagia    Carotid stenosis 07/13/2013   Anticoagulated on Eliquis 16/03/930   PVD - 35% LICA, 57% RICA by doppler 5/14 01/24/2013   Thrombus of left atrial appendage on TEE 01/23/13 01/24/2013   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 03/31/2011   Atrial fibrillation, chronic ([place]) 03/31/2011   Cardiomyopathy,  ischemic, improved 03/31/2011   CVA (cerebral vascular accident) ([place]) 03/31/2011   Renal calculi 03/31/2011   History of tobacco use, continues with chewing tobacco 03/31/2011    Past Medical History:  Diagnosis Date   Acute respiratory failure ([place])    Appendicitis with abscess 03/31/2011   Arthritis    CAD (coronary artery disease),CABG 1993-LIMA to the  LAD, SVG to Om and SVG to PDA/PLA, patent on cath 2008. 2003   a. s/p CABG 2003. b. last cath 2008 with patent grafts.   Chronic atrial fibrillation (HCC)    permanent   Chronic systolic CHF (congestive heart failure) ([place])    a. Prior low EF, later normalized.   COVID 03/14/2021   Esophageal obstruction due to food impaction    Esophageal stricture    Essential hypertension    Food impaction of esophagus    GERD (gastroesophageal reflux disease)    Heart murmur    Hyperlipidemia    Ischemic cardiomyopathy    Kidney stones    "passed all but one time when he had to have lithotripsy" (01/21/2013)   Renal infarct ([place])    a. 07/2015 - after holding Xarelto x 3 days for spinal procedure.   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 felt vasovagal in setting of pain from renal infarct.    Past Surgical History:  Procedure Laterality Date   BALLOON DILATION N/A 05/22/2015   Procedure: BALLOON DILATION;  Surgeon: Gatha Mayer, MD;  Location: [facility] ENDOSCOPY;  Service: Endoscopy;  Laterality: N/A;   BALLOON DILATION N/A 12/15/2018   Procedure: BALLOON DILATION;  Surgeon: Gatha Mayer, MD;  Location: [facility];  Service: Endoscopy;  Laterality: N/A;   BALLOON DILATION N/A 10/14/2021   Procedure: BALLOON DILATION;  Surgeon: Daryel November, MD;  Location: [facility];  Service: Gastroenterology;  Laterality: N/A;   BALLOON DILATION N/A 10/24/2021   Procedure: BALLOON DILATION;  Surgeon: Gatha Mayer, MD;  Location: [facility];  Service: Gastroenterology;  Laterality: N/A;   CARDIAC CATHETERIZATION  02/24/2002   reduced LV function, 60-70% prox RCA stenosis, 70% PLA ostial stenosis, 80% secondary branch of PLA stenosis - subsequent CABG (Dr. Jackie Plum)   Carotid Doppler  06/2012   50-69% right bulb/prox ICA diameter reduction; 70-99% left bulb/prox ICA  diameter reduction   CATARACT EXTRACTION W/ INTRAOCULAR LENS IMPLANT Bilateral 2013   COLONOSCOPY N/A 05/18/2016   Procedure: COLONOSCOPY;  Surgeon: Manus Gunning, MD;  Location: [facility] ENDOSCOPY;  Service: Gastroenterology;  Laterality: N/A;   CORONARY ANGIOPLASTY  07/05/2006   3 vessel CAD, patent LIMA to LAD, patentVG to OM, patent SVG to PDA & PLA, mild MR, severe LV systolic dysfunction (Dr. Gerrie Nordmann)   CORONARY ARTERY BYPASS GRAFT  03/01/2002   LIMA to LAD, reverse SVG to OM, reverse SVG to PDA of RCA, reverse SVG to PLA of RCA, ligation of LA appendage (Dr. Servando Snare)   ENTEROSCOPY N/A 05/20/2016   Procedure: ENTEROSCOPY;  Surgeon: Manus Gunning, MD;  Location: [place] [facility];  Service: Gastroenterology;  Laterality: N/A;   ESOPHAGOGASTRODUODENOSCOPY  02/01/2012   Procedure: ESOPHAGOGASTRODUODENOSCOPY (EGD);  Surgeon: Beryle Beams, MD;  Location: [facility];  Service: Endoscopy;  Laterality: N/A;   ESOPHAGOGASTRODUODENOSCOPY N/A 05/22/2015   Procedure: ESOPHAGOGASTRODUODENOSCOPY (EGD);  Surgeon: Gatha Mayer, MD;  Location: [facility];  Service: Endoscopy;  Laterality: N/A;   ESOPHAGOGASTRODUODENOSCOPY N/A 05/17/2016   Procedure: ESOPHAGOGASTRODUODENOSCOPY (EGD);  Surgeon: Juanita Craver, MD;  Location: [facility] ENDOSCOPY;  Service: Endoscopy;  Laterality: N/A;   ESOPHAGOGASTRODUODENOSCOPY N/A 09/17/2017   Procedure: ESOPHAGOGASTRODUODENOSCOPY (EGD);  Surgeon: Milus Banister, MD;  Location: [facility] ENDOSCOPY;  Service: Endoscopy;  Laterality: N/A;   ESOPHAGOGASTRODUODENOSCOPY Left 02/09/2020   Procedure: ESOPHAGOGASTRODUODENOSCOPY (EGD);  Surgeon: Lavena Bullion, DO;  Location: [facility] ENDOSCOPY;  Service: Gastroenterology;  Laterality: Left;   ESOPHAGOGASTRODUODENOSCOPY (EGD) WITH PROPOFOL N/A 12/15/2018   Procedure: ESOPHAGOGASTRODUODENOSCOPY (EGD) WITH PROPOFOL;  Surgeon: Gatha Mayer, MD;  [facility];  Service: Endoscopy;  Laterality: N/A;   ESOPHAGOGASTRODUODENOSCOPY  (EGD) WITH PROPOFOL N/A 04/07/2021   Procedure: ESOPHAGOGASTRODUODENOSCOPY (EGD) WITH PROPOFOL;  Surgeon: Milus Banister, MD;  [facility]: [facility];  Service: Endoscopy;  Laterality: N/A;   ESOPHAGOGASTRODUODENOSCOPY (EGD) WITH PROPOFOL N/A 10/14/2021   Procedure: ESOPHAGOGASTRODUODENOSCOPY (EGD) WITH PROPOFOL;  Surgeon: Daryel November, MD;  [facility]: [facility];  Service: Gastroenterology;  Laterality: N/A;   ESOPHAGOGASTRODUODENOSCOPY (EGD) WITH PROPOFOL N/A 10/24/2021   Procedure: ESOPHAGOGASTRODUODENOSCOPY (EGD) WITH PROPOFOL;  Surgeon: Gatha Mayer, MD;  [facility]: [facility];  Service: Gastroenterology;  Laterality: N/A;   FOREIGN BODY REMOVAL  09/17/2017   Procedure: FOREIGN BODY REMOVAL;  Surgeon: Milus Banister, MD;  Location: [facility] ENDOSCOPY;  Service: Endoscopy;;   FOREIGN BODY REMOVAL  12/15/2018   Procedure: FOREIGN BODY REMOVAL;  Surgeon: Gatha Mayer, MD;  [facility]: [facility];  Service: Endoscopy;;   FOREIGN BODY REMOVAL  02/09/2020   Procedure: FOREIGN BODY REMOVAL;  Surgeon: Lavena Bullion, DO;  Location: [place];  Service: Gastroenterology;;   FOREIGN BODY REMOVAL  10/14/2021   Procedure: FOREIGN BODY REMOVAL;  Surgeon: Daryel November, MD;  Location: [facility];  Service: Gastroenterology;;   GIVENS CAPSULE STUDY N/A 05/18/2016   Procedure: GIVENS CAPSULE STUDY;  Surgeon: Manus Gunning, MD;  Location: [facility];  Service: Gastroenterology;  Laterality: N/A;   LAPAROSCOPIC APPENDECTOMY  03/30/2011   Procedure: APPENDECTOMY LAPAROSCOPIC;  Surgeon: Joyice Faster. Cornett, MD;  Location: [facility];  Service: General;  Laterality: N/A;   LEFT HEART CATHETERIZATION WITH CORONARY ANGIOGRAM N/A 01/24/2013   Procedure: LEFT HEART CATHETERIZATION WITH CORONARY ANGIOGRAM;  Surgeon: [name], MD;  Location: [facility] CATH LAB;  Service: Cardiovascular;  Laterality: N/A;  Right heart with grafts   LITHOTRIPSY     "once" (01/21/2013)   [place]   05/2009   persantine myoview - fixed moderate perfusion defect in inferior wall & lateral segment of apex (poor non-transmural infarction), minimal anterolateral periinfarct reversible ischemia seen, abnormal study, defects similar to 2006 study   RIGHT/LEFT HEART CATH AND CORONARY ANGIOGRAPHY N/A 09/17/2017   Procedure: RIGHT/LEFT HEART CATH AND CORONARY ANGIOGRAPHY;  Surgeon: Martinique, Peter M, MD;  Location: [place] CV LAB;  Service: Cardiovascular;  Laterality: N/A;   SPLENECTOMY, TOTAL  01/2013   TEE WITHOUT CARDIOVERSION N/A 01/23/2013   Procedure: TRANSESOPHAGEAL ECHOCARDIOGRAM (TEE);  Surgeon: Sanda Klein, MD;  Location[facility] ENDOSCOPY;  Service: Cardiovascular;  Laterality: N/A;   TRANSTHORACIC ECHOCARDIOGRAM  06/23/2012   EF 40-45%, mild LVH; mild AV regurg; mild MV regurg; LV mod-severely dilated; RV mildly dilated; systolic pressure borderline increased; RA mod dilated    Social History   Tobacco Use   Smoking status: Former    Packs/day: 2.00    Years: 20.00    Total pack years: 40.00    Types: Cigarettes    Quit date:  04/08/1986    Years since quitting: 35.6   Smokeless tobacco: Current    Types: Chew   Tobacco comments:    Encouraged cessation  Vaping Use   Vaping Use: Never used  Substance Use Topics   Alcohol use: No    Alcohol/week: 0.0 standard drinks of alcohol   Drug use: No    Family History  Problem Relation Age of Onset   Heart disease Father        rheumatic fever   Heart attack Brother 79   Stroke Mother    Stroke Brother 76   Stomach cancer Neg Hx    Rectal cancer Neg Hx    Prostate cancer Neg Hx    Pancreatic cancer Neg Hx    Ovarian cancer Neg Hx     Allergies  Allergen Reactions   Percocet [Oxycodone-Acetaminophen] Itching and Nausea Only    Medication list has been reviewed and updated.  Current Outpatient Medications on File Prior to Visit  Medication Sig Dispense Refill   acetaminophen (TYLENOL) 500 MG tablet Take 500 mg by mouth  every 6 (six) hours as needed for moderate pain.     carvedilol (COREG) 3.125 MG tablet TAKE 1 TABLET(3.125 MG) BY MOUTH TWICE DAILY WITH A MEAL 180 tablet 3   ELIQUIS 2.5 MG TABS tablet TAKE 1 TABLET(2.5 MG) BY MOUTH TWICE DAILY 180 tablet 2   ezetimibe (ZETIA) 10 MG tablet TAKE 1 TABLET(10 MG) BY MOUTH DAILY (Patient taking differently: Take 10 mg by mouth daily.) 90 tablet 2   gabapentin (NEURONTIN) 100 MG capsule Take 100 mg by mouth at bedtime as needed (nerve pain.).     levothyroxine (SYNTHROID) 25 MCG tablet TAKE 1 TABLET(25 MCG) BY MOUTH DAILY BEFORE BREAKFAST 90 tablet 1   Melatonin 10 MG TABS Take 10 mg by mouth at bedtime as needed (sleep).     pantoprazole (PROTONIX) 40 MG tablet TAKE 1 TABLET(40 MG) BY MOUTH TWICE DAILY (Patient taking differently: Take 40 mg by mouth 2 (two) times daily.) 180 tablet 2   rOPINIRole (REQUIP) 0.25 MG tablet TAKE 1 TABLET(0.25 MG) BY MOUTH TWICE DAILY 180 tablet 3   rosuvastatin (CRESTOR) 40 MG tablet TAKE 1 TABLET(40 MG) BY MOUTH DAILY (Patient taking differently: Take 40 mg by mouth daily.) 90 tablet 1   tiZANidine (ZANAFLEX) 4 MG tablet TAKE 1 TABLET(4 MG) BY MOUTH EVERY 8 HOURS AS NEEDED FOR MUSCLE SPASMS. DO NOT COMBINE WITH TRAMADOL (Patient taking differently: Take 4 mg by mouth at bedtime as needed for muscle spasms.) 60 tablet 1   traMADol (ULTRAM) 50 MG tablet TAKE 1/2 TABLET BY MOUTH EVERY 8 HOURS AS NEEDED FOR PAIN 30 tablet 1   No current facility-administered medications on file prior to visit.    Review of Systems:  As per HPI- otherwise negative. BP Readings from Last 3 Encounters:  12/10/21 110/60  12/02/21 (!) 108/58  10/24/21 (!) 134/59     Physical Examination: Vitals:   12/10/21 1304  BP: 110/60  Pulse: 60  Resp: 18  Temp: 97.8 F (36.6 C)  SpO2: 97%   Vitals:   12/10/21 1304  Weight: 184 lb (83.5 kg)  Height: '5\' 6"'$  (1.676 m)   Body mass index is 29.7 kg/m. Ideal Body Weight: Weight in (lb) to have BMI = 25:  154.6  GEN: no acute distress.  Overweight, well-appearing elderly man.  Accompanied by his daughter HEENT: Atraumatic, Normocephalic.  Ears and Nose: No external deformity. CV: rate controlled a fib, No  M/G/R. No JVD. No thrill. No extra heart sounds. PULM: CTA B, no wheezes, crackles, rhonchi. No retractions. No resp. distress. No accessory muscle use. ABD: S, NT, ND EXTR: No c/c/e PSYCH: Normally interactive. Conversant.  Ankles show really no edema   Assessment and Plan: Persistent atrial fibrillation (HCC) - Plan: TSH  Acute renal insufficiency  Frail elderly  Esophageal stricture  Other specified hypothyroidism - Plan: TSH  Dyslipidemia - Plan: Lipid panel  Atrial fibrillation, chronic (HCC) - Plan: B Nat Peptide  Cardiomyopathy, unspecified type ([place]) - Plan: B Nat Peptide  Need for influenza vaccination - Plan: Flu Vaccine QUAD High Dose(Fluad)  Patient seen today for follow-up.  He is in atrial fib as per usual, he is anticoagulated and rate controlled Esophageal stricture is currently not bothersome, encouraged him to avoid difficult to swallow foods and seek care as needed Check on TSH today As above, significant weight gain since August.  However, this is likely attributable to esophageal stricture at that time.  I will check a BNP to ensure not elevated  Assuming all is well, follow-up in 6 months  Signed Lamar Blinks, MD   Received labs, called daughter [name] 9/29-  Spoke with her- recheck in 6 months All ok  BNP in middle range, likely insignificant  Results for orders placed or performed in visit on 12/10/21  TSH  Result Value Ref Range   TSH 4.43 0.35 - 5.50 uIU/mL  Lipid panel  Result Value Ref Range   Cholesterol 126 0 - 200 mg/dL   Triglycerides 137.0 0.0 - 149.0 mg/dL   HDL 57.00 >39.00 mg/dL   VLDL 27.4 0.0 - 40.0 mg/dL   LDL Cholesterol 41 0 - 99 mg/dL   Total CHOL/HDL Ratio 2    NonHDL 68.66   B Nat Peptide  Result Value Ref  Range   Pro B Natriuretic peptide (BNP) 392.0 (H) 0.0 - 100.0 pg/mL

## 2021-12-08 NOTE — Patient Instructions (Incomplete)
It was good to see you again today I recommend getting the shingles vaccine series, COVID-vaccine, RSV at your pharmacy Flu shot today!   I will check labs today Cholesterol Thyroid level BNP to make sure no sign of excessive fluid overload  Assuming all is ok please see me in about 6 months and take care!

## 2021-12-10 ENCOUNTER — Ambulatory Visit (INDEPENDENT_AMBULATORY_CARE_PROVIDER_SITE_OTHER): Payer: Medicare Other | Admitting: Family Medicine

## 2021-12-10 ENCOUNTER — Encounter: Payer: Self-pay | Admitting: Family Medicine

## 2021-12-10 VITALS — BP 110/60 | HR 60 | Temp 97.8°F | Resp 18 | Ht 66.0 in | Wt 184.0 lb

## 2021-12-10 DIAGNOSIS — I429 Cardiomyopathy, unspecified: Secondary | ICD-10-CM | POA: Diagnosis not present

## 2021-12-10 DIAGNOSIS — Z23 Encounter for immunization: Secondary | ICD-10-CM | POA: Diagnosis not present

## 2021-12-10 DIAGNOSIS — K222 Esophageal obstruction: Secondary | ICD-10-CM

## 2021-12-10 DIAGNOSIS — I482 Chronic atrial fibrillation, unspecified: Secondary | ICD-10-CM

## 2021-12-10 DIAGNOSIS — E038 Other specified hypothyroidism: Secondary | ICD-10-CM

## 2021-12-10 DIAGNOSIS — R54 Age-related physical debility: Secondary | ICD-10-CM

## 2021-12-10 DIAGNOSIS — E785 Hyperlipidemia, unspecified: Secondary | ICD-10-CM

## 2021-12-10 DIAGNOSIS — N289 Disorder of kidney and ureter, unspecified: Secondary | ICD-10-CM | POA: Diagnosis not present

## 2021-12-10 DIAGNOSIS — I4819 Other persistent atrial fibrillation: Secondary | ICD-10-CM

## 2021-12-11 ENCOUNTER — Other Ambulatory Visit: Payer: Self-pay | Admitting: Cardiovascular Disease

## 2021-12-11 ENCOUNTER — Telehealth: Payer: Self-pay | Admitting: Cardiovascular Disease

## 2021-12-11 DIAGNOSIS — E785 Hyperlipidemia, unspecified: Secondary | ICD-10-CM

## 2021-12-11 LAB — LIPID PANEL
Cholesterol: 126 mg/dL (ref 0–200)
HDL: 57 mg/dL (ref >39.00)
LDL Cholesterol: 41 mg/dL (ref 0–99)
NonHDL: 68.66
Total CHOL/HDL Ratio: 2
Triglycerides: 137 mg/dL (ref 0.0–149.0)
VLDL: 27.4 mg/dL (ref 0.0–40.0)

## 2021-12-11 LAB — TSH: TSH: 4.43 u[IU]/mL (ref 0.35–5.50)

## 2021-12-11 LAB — BRAIN NATRIURETIC PEPTIDE: Pro B Natriuretic peptide (BNP): 392 pg/mL — ABNORMAL HIGH (ref 0.0–100.0)

## 2021-12-11 MED ORDER — ROSUVASTATIN CALCIUM 40 MG PO TABS: ORAL_TABLET | ORAL | 3 refills | Status: DC

## 2021-12-11 NOTE — Telephone Encounter (Signed)
*  STAT* If patient is at the pharmacy, call can be transferred to refill team.   1. Which medications need to be refilled? (please list name of each medication and dose if known) rosuvastatin (CRESTOR) 40 MG tablet pantoprazole (PROTONIX) 40 MG tablet  2. Which pharmacy/location (including street and city if local pharmacy) is medication to be sent to? [place], [place] - [address] AT [place]  3. Do they need a 30 day or 90 day supply? [place]

## 2021-12-11 NOTE — Telephone Encounter (Signed)
Patient calling the office for samples of medication:   1.  What medication and dosage are you requesting samples for? ELIQUIS 2.5 MG TABS tablet  2.  Are you currently out of this medication?  Pt has enough for a week, but requesting more samples due to being in donut hole. Did not qualify for assistance.

## 2021-12-11 NOTE — Telephone Encounter (Addendum)
Informed daughter that refill samples for eliquis 2.'5mg'$  twice daily was approved by [employer] [place]. Also informed her that patient was entered into a study by [employer] and if he qualifies, his medication would be free. She thanked [place] for that and will pick up samples today.

## 2021-12-12 ENCOUNTER — Other Ambulatory Visit: Payer: Self-pay | Admitting: Cardiovascular Disease

## 2021-12-12 NOTE — Telephone Encounter (Signed)
Wife called to follow-up on prescription refill to be sent to [facility].  Patient is out of this mediation.

## 2021-12-12 NOTE — Telephone Encounter (Signed)
*  STAT* If patient is at the pharmacy, call can be transferred to refill team.   1. Which medications need to be refilled? (please list name of each medication and dose if known)   pantoprazole (PROTONIX) 40 MG tablet  2. Which pharmacy/location (including street and city if local pharmacy) is medication to be sent to?  [place], [place] - [address] AT [place]  3. Do they need a 30 day or 90 day supply?  90 day  Caller stated patient is out of this medication.

## 2021-12-17 ENCOUNTER — Telehealth: Payer: Self-pay | Admitting: Pharmacist

## 2021-12-17 ENCOUNTER — Telehealth: Payer: Medicare Other

## 2021-12-17 NOTE — Telephone Encounter (Signed)
  Care Management   Follow Up Note   12/17/2021 Name: Gregory [name] MRN: [id number] DOB: [birth year]   Referred by: Darreld Mclean, MD Reason for referral : No chief complaint on file.   An unsuccessful telephone outreach was attempted today. The patient was referred to the case management team for assistance with care management and care coordination.  Plan was to check in regarding patient assistance program. From phone notes patient's daughter did not feel like he would qualify for patient assistance program for Eliquis.  She is working with cardiologist office to identify other options.   Follow Up Plan: No further follow up required:    SIGNATURE

## 2022-01-22 ENCOUNTER — Telehealth: Payer: Self-pay | Admitting: Cardiovascular Disease

## 2022-01-22 NOTE — Telephone Encounter (Signed)
Spoke with pt daughter, she reports that he got back on his medications today and is feeling better. Aware nothing to do at this time but get him back on his medications.

## 2022-01-22 NOTE — Telephone Encounter (Signed)
Patient's daughter called and said that patient forgot to take all medications yesterday. Wants to know what he should do next.

## 2022-02-16 ENCOUNTER — Other Ambulatory Visit: Payer: Self-pay | Admitting: Cardiovascular Disease

## 2022-02-16 DIAGNOSIS — M48061 Spinal stenosis, lumbar region without neurogenic claudication: Secondary | ICD-10-CM | POA: Diagnosis not present

## 2022-02-18 ENCOUNTER — Encounter: Payer: Self-pay | Admitting: Cardiovascular Disease

## 2022-02-18 ENCOUNTER — Ambulatory Visit: Payer: Medicare Other | Attending: Cardiovascular Disease | Admitting: Cardiovascular Disease

## 2022-02-18 DIAGNOSIS — I6523 Occlusion and stenosis of bilateral carotid arteries: Secondary | ICD-10-CM | POA: Diagnosis not present

## 2022-02-18 DIAGNOSIS — E785 Hyperlipidemia, unspecified: Secondary | ICD-10-CM | POA: Diagnosis not present

## 2022-02-18 DIAGNOSIS — I4821 Permanent atrial fibrillation: Secondary | ICD-10-CM | POA: Diagnosis not present

## 2022-02-18 DIAGNOSIS — E039 Hypothyroidism, unspecified: Secondary | ICD-10-CM | POA: Diagnosis not present

## 2022-02-18 DIAGNOSIS — I255 Ischemic cardiomyopathy: Secondary | ICD-10-CM | POA: Diagnosis not present

## 2022-02-18 DIAGNOSIS — Z951 Presence of aortocoronary bypass graft: Secondary | ICD-10-CM | POA: Diagnosis not present

## 2022-02-18 DIAGNOSIS — Z7901 Long term (current) use of anticoagulants: Secondary | ICD-10-CM | POA: Diagnosis not present

## 2022-02-18 MED ORDER — CARVEDILOL 3.125 MG PO TABS: ORAL_TABLET | ORAL | 3 refills | Status: DC

## 2022-02-18 MED ORDER — PANTOPRAZOLE SODIUM 40 MG PO TBEC: DELAYED_RELEASE_TABLET | ORAL | 3 refills | Status: DC

## 2022-02-18 MED ORDER — APIXABAN 2.5 MG PO TABS: ORAL_TABLET | ORAL | 3 refills | Status: DC

## 2022-02-18 MED ORDER — LEVOTHYROXINE SODIUM 25 MCG PO TABS: ORAL_TABLET | ORAL | 3 refills | Status: DC

## 2022-02-18 MED ORDER — ROSUVASTATIN CALCIUM 40 MG PO TABS: ORAL_TABLET | ORAL | 3 refills | Status: DC

## 2022-02-18 NOTE — Progress Notes (Signed)
Patient ID: Gregory [name], male   DOB: December 25, 1932, 88 y.o.   MRN: [id number]      HPI: Gregory [name] is a 88 y.o. male who presents for a 6 month follow-up cardiology evaluation.  Mr. Chiang has a history of known CAD in 2003 underwent CABG revascularization surgery by Dr. Servando Snare  (LIMA  to  LAD, vein graft to the obtuse marginal, sequential vein graft to the PDA and PLA of RCA).  His last cardiac catheterization was in 2008 done by Dr. Tami Ribas. At that time, ejection fraction was 20-25%. He declined consideration for ICD. Ejection fraction improved to approximately 35-40% in September 2012 on medical therapy. In the past, he has refused Coumadin therapy and had been maintained on aspirin and Plavix. In 2013 he was hospitalized for appendicitis with abscess requiring laparoscopic appendectomy. He was hospitalized in November 2014 and was felt to have a splenic infarct of embolic etiology. He underwent a transesophageal echocardiogram on 01/23/2013 by Dr. Sallyanne Kuster which revealed an ejection fraction in the range of 15-25%. There was diffuse hypokinesis with severe hypokinesis to akinesis of the anteroseptal apical myocardium. He had increased left ventricular end-diastolic filling pressure and elevated left atrial filling pressure. There was evidence for large solid fixed thrombus occupying the entire atrial appendage with moderate continuous spontaneous echo contrast ("smoke") in the left atrial cavity. There was no evidence for right atrial thrombus. At that time, the patient refused life vest.  He has been on Xarelto for anticoagulation.He denies chest pain. He denies significant shortness of breath. He is unaware of tachdysrhythmias.   He has permanent atrial fibrillation, but his rate has been controlled.  He denies any presyncope or syncope.  He denies any chest pain.  A followup echo Doppler study on 06/14/2013 revealed significant improvement in LV function with an ejection fraction estimated now  at 50-55% without regional wall motion abnormalities.  He did have mild aortic root dilatation, mild aortic insufficiency, and mild mitral regurgitation with biatrial enlargement.   He has severe bilateral carotid artery disease.  He had been seen by Dr. Oneida Alar.  His prior carotid duplex scan was significantly abnormal with his left carotid velocities increased to 638 systolic and 937 diastolic with severe fibrous plaque and elevated velocities within all segments of his left internal carotid.  There also was 50-69% diameter reduction in the right bulb/proximal ICA. I referred him to Dr. Gwenlyn Found who subsequently referred him to Dr. Trula Slade.  He denies chest pain.  He denies palpitations.  He denies shortness of breath.  He continues to say he is asymptomatic with reference to his carotid disease and has refused intervention.  A follow-up Doppler study on 08/23/2014 showed increased velocities in the left ar  492 over 148 at the MICA level and to 30/67, at the PICA level.  On the right, his PICA velocity was 292/78.  When compared to his prior study.  This has not increased and has remained fairly stable.  He denies chest pain or palpitations.  He denies paresthesias.  He denies visual symptoms.  When I saw him in follow-up,  he remained asymptomatic.  He has refused to consider any potential carotid intervention or surgery.  He was unaware of any fast heartbeats with his chronic atrial fibrillation.  He denies bleeding.  A carotid study earlier today continued to demonstrate severe stenosis with peak  velocity in the left MICA at 500/122 and PICA 241/34. On the right,PICA was 293/106.  At his last office visit,  I recommended discontinuing of simvastatin in attempt to be more aggressive with lipid lowering and started Crestor 40 mg daily.  He was hospitalized May 2017  after sustaining a renal infarct with acute kidney injury after taking 3 days off Xarelto for pain management injection to his back.  He had  developed mild anemia with a hemoglobin drop of 3 points leading to a subsequent emergency room evaluation.  A CT of his abdomen and pelvis did not show acute findings or signs of peritoneal bleeding.  He denied any episodes of chest pain,  paresthesias, presyncope or syncope. Or bleeding  An echo Doppler study in May 2017 showed an EF of 55-60%.  There was mild aortic insufficiency, mild aortic root dilatation, mitral annular calcification with mild MR.   On a subsequent office visit he began to consider the possibility of maybe in the future thinking about carotid surgery, but he was not ready yet to have this done.  His last carotid study in September 2017 showed left carotid peak systolic velocity of 341 and peak diastolic velocity 937, consistent with a least 80% stenosis.  On the right peak systolic velocity was 902 and diastolic velocity was 85.  He is followed by Dr. Posey Pronto for his chronic kidney disease.  When I saw him in July 2018 he was feeling well and denied any episodes of chest pain or shortness of breath.  He denied any neurologic symptoms.  He denied paresthesias or dizziness.  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 At that time he was still against consideration for carotid surgery.  He was on aggressive lipid-lowering therapy with target LDL less than 70 and potential induce plaque regression.  He was seen by Almyra Deforest, PAC in June 2019.  He was on Eliquis without bleeding he was hospitalized from July 2 through July 6 with acute decompensated heart failure complicated by acute hypoxic respiratory failure.  An echo Doppler study showed an EF of 25 to 30% with diffuse hypokinesis which had significantly decreased from 50 to 60% in 2017.  He underwent repeat catheterization which showed occlusion of his native vessels with a patent LIMA to the LAD, a patent vein graft was on 2, and subchondral vein graft to the PDA/PLA.  He was seen by Lamar Blinks in follow-up on September 29, 2017.  At that time he was feeling well and denied significant shortness of breath and was on lisinopril 5 mg, furosemide 40 mg daily in addition to carvedilol 3.125 mg twice a day.  He is anticoagulated on Eliquis 2.5 mg twice a day.  He is on rosuvastatin 40 mg.  For restless legs he also has been on Requip.   I saw him in August 2019.  At that time he was doing well and I discussed potential transition to [facility] depending upon improvement in his renal function.  He is followed by Dr. Posey Pronto for his kidney disease.  Creatinine on December 01, 2017 was 2.46.  He denies recurrent chest pain or bleeding.  He is followed by Dr. Oneida Alar since continues to have no interest in pursuing carotid revascularization.    I last saw him in March 2020 at which time he was continuing to do well and remains very active.    He sees Dr. Posey Pronto who follows his chronic kidney disease.    I saw him in September 2020 at which time he denied any episodes of dizziness, paresthesias and continued to remain active.  In  April he cut his finger on a table saw.  He works every day in the garden and the week prior to his office visit he built a fence in his yard.  He feels that he has not had any reduction in his energy level and denies any exertional shortness of breath or chest pain.  During that evaluation his blood pressure was stable on lisinopril 5 mg, carvedilol 3.125 mg twice a day, and furosemide 40 mg.  He continues to be on rosuvastatin 40 mg for aggressive lipid management and attempt to induce plaque regression.  There was no bleeding on Eliquis.  I saw him in March 2021 at which time he continued to be asymptomatic.  He was seeing Dr. Posey Pronto for renal insufficiency and creatinine on May 17, 2019 was 1.82.  Lipid studies revealed an LDL cholesterol of 83.  He denies chest pain PND orthopnea presyncope or syncope.   He was evaluated in October 2021 by Fabian Sharp, PA and remained stable.  I last saw him on August 28, 2020 at which time he continued to be extremely active.  He is cutting his grass, building porches index and driving a tractor.  He tells me when he last saw Dr. Posey Pronto his kidney function had improved.  He has been on Eliquis at reduced dose due to his age of greater than 8 and creatinine greater than 1.5.  Presently continues to be on Eliquis 2.5 mg twice a day, lisinopril 5 mg, furosemide 20 mg daily in addition to carvedilol 3.125 mg twice a day.  He denies any paresthesias or weakness.  He continues to be on Zetia 10 mg and rosuvastatin 40 mg for aggressive lipid management.  LDL cholesterol in March 2022 was excellent at 33.  Total cholesterol 96.  At that time creatinine was 1.37.    Since I saw him, he developed COVID on March 14, 2021.  He was sick for approximately a month.  He was hospitalized 29 and discharged on April 17, 2021 with acute on chronic CHF.  He was subsequently evaluated by Almyra Deforest, PA on May 21, 2021.  His creatinine had increased to 1.6.  His Lasix dose was reduced to 20 mg following his hospitalization and it was recommended by Carley Hammed that he reduce Lasix further to every other day and reduce potassium supplementation to 10 mg every other day as well.  Follow-up laboratory on June 02, 2021 showed a creatinine improved slightly at 1.43.  Potassium was 4.6  I last saw him on September 10, 2021 at which time he felt well and denied any recurrent chest pain, palpitations presyncope or syncope.  He continued to be on carvedilol 3.125 mg twice a day, furosemide 20 mg every other day with every other day 10 mill equivalent potassium.  Lipids are treated with Zetia 10 mg and rosuvastatin 40 mg.  He is on levothyroxine for mild hypothyroidism 25 mcg.  He takes gabapentin at bedtime.  Most recent LDL cholesterol was 33  Presently, he continues to feel exceptionally well and remains active at 88 years old.  He states his blood pressure at home typically runs between 118 and 120.  He sees  Dr. Posey Pronto for CKD and on November 19, 2021 creatinine was 1.6.  Potassium was 4.9.  He continues to be on carvedilol 3.125 mg twice a day.  He is on Eliquis at reduced dose 2.5 mg for anticoagulation he is on Zetia and rosuvastatin 40 mg for hyperlipidemia and LDL cholesterol  was excellent at 41 on December 10, 2021.  He presents for evaluation.   Past Medical History:  Diagnosis Date   Acute respiratory failure ([place])    Appendicitis with abscess 03/31/2011   Arthritis    CAD (coronary artery disease),CABG 1993-LIMA to the LAD, SVG to Om and SVG to PDA/PLA, patent on cath 2008. 2003   a. s/p CABG 2003. b. last cath 2008 with patent grafts.   Chronic atrial fibrillation (HCC)    permanent   Chronic systolic CHF (congestive heart failure) ([place])    a. Prior low EF, later normalized.   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 07/2015 - after holding Xarelto x 3 days for spinal procedure.   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 felt vasovagal in setting of pain from renal infarct.    Past Surgical History:  Procedure Laterality Date   BALLOON DILATION N/A 05/22/2015   Procedure: BALLOON DILATION;  Surgeon: Gatha Mayer, MD;  Location: [facility] END[facility];  Service: Endoscopy;  Laterality: N/A;   BALLOON DILATION N/A 12/15/2018   Procedure: BALLOON DILATION;  Surgeon: Gatha Mayer, MD;  Location: [facility];  Service: Endoscopy;  Laterality: N/A;   BALLOON DILATION N/A 10/14/2021   Procedure: BALLOON DILATION;  Surgeon: Daryel November, MD;  Location: [facility];  Service: Gastroenterology;  Laterality: N/A;   BALLOON DILATION N/A 10/24/2021   Procedure: BALLOON DILATION;  Surgeon: Gatha Mayer, MD;  Location: [facility];  Service: Gastroenterology;  Laterality: N/A;   CARDIAC CATHETERIZATION  02/24/2002   reduced LV function, 60-70% prox RCA stenosis, 70% PLA ostial stenosis, 80% secondary branch of PLA stenosis - subsequent CABG (Dr. Jackie Plum)   Carotid Doppler  06/2012   50-69% right bulb/prox ICA diameter reduction; 70-99% left bulb/prox ICA diameter reduction   CATARACT EXTRACTION W/ INTRAOCULAR LENS IMPLANT Bilateral 2013   COLONOSCOPY N/A 05/18/2016   Procedure: COLONOSCOPY;  Surgeon: Manus Gunning, MD;  Location: [facility] ENDOSCOPY;  Service: Gastroenterology;  Laterality: N/A;   CORONARY ANGIOPLASTY  07/05/2006   3 vessel CAD, patent LIMA to LAD, patentVG to OM, patent SVG to PDA & PLA, mild MR, severe LV systolic dysfunction (Dr. Gerrie Nordmann)   CORONARY ARTERY BYPASS GRAFT  03/01/2002   LIMA to LAD, reverse SVG to OM, reverse SVG to PDA of RCA, reverse SVG to PLA of RCA, ligation of LA appendage (Dr. Servando Snare)   ENTEROSCOPY N/A 05/20/2016   Procedure: ENTEROSCOPY;  Surgeon: Manus Gunning, MD;  Location: [place];  Service: Gastroenterology;  Laterality: N/A;   ESOPHAGOGASTRODUODENOSCOPY  02/01/2012   Procedure: ESOPHAGOGASTRODUODENOSCOPY (EGD);  Surgeon: Beryle Beams, MD;  Location: [facility];  Service: Endoscopy;  Laterality: N/A;   ESOPHAGOGASTRODUODENOSCOPY N/A 05/22/2015   Procedure: ESOPHAGOGASTRODUODENOSCOPY (EGD);  Surgeon: Gatha Mayer, MD;  Location: [facility];  Service: Endoscopy;  Laterality: N/A;   ESOPHAGOGASTRODUODENOSCOPY N/A 05/17/2016   Procedure: ESOPHAGOGASTRODUODENOSCOPY (EGD);  Surgeon: Juanita Craver,  MD;  Location: [place] ENDOSCOPY;  Service: Endoscopy;  Laterality: N/A;   ESOPHAGOGASTRODUODENOSCOPY N/A 09/17/2017   Procedure: ESOPHAGOGASTRODUODENOSCOPY (EGD);  Surgeon:  Milus Banister, MD;  Location: [facility] ENDOSCOPY;  Service: Endoscopy;  Laterality: N/A;   ESOPHAGOGASTRODUODENOSCOPY Left 02/09/2020   Procedure: ESOPHAGOGASTRODUODENOSCOPY (EGD);  Surgeon: Lavena Bullion, DO;  Location: [facility] ENDOSCOPY;  Service: Gastroenterology;  Laterality: Left;   ESOPHAGOGASTRODUODENOSCOPY (EGD) WITH PROPOFOL N/A 12/15/2018   Procedure: ESOPHAGOGASTRODUODENOSCOPY (EGD) WITH PROPOFOL;  Surgeon: Gatha Mayer, MD;  Location: [facility];  Service: Endoscopy;  Laterality: N/A;   ESOPHAGOGASTRODUODENOSCOPY (EGD) WITH PROPOFOL N/A 04/07/2021   Procedure: ESOPHAGOGASTRODUODENOSCOPY (EGD) WITH PROPOFOL;  Surgeon: Milus Banister, MD;  Location: [facility];  Service: Endoscopy;  Laterality: N/A;   ESOPHAGOGASTRODUODENOSCOPY (EGD) WITH PROPOFOL N/A 10/14/2021   Procedure: ESOPHAGOGASTRODUODENOSCOPY (EGD) WITH PROPOFOL;  Surgeon: Daryel November, MD;  Location: [facility];  Service: Gastroenterology;  Laterality: N/A;   ESOPHAGOGASTRODUODENOSCOPY (EGD) WITH PROPOFOL N/A 10/24/2021   Procedure: ESOPHAGOGASTRODUODENOSCOPY (EGD) WITH PROPOFOL;  Surgeon: Gatha Mayer, MD;  Location: [facility];  Service: Gastroenterology;  Laterality: N/A;   FOREIGN BODY REMOVAL  09/17/2017   Procedure: FOREIGN BODY REMOVAL;  Surgeon: Milus Banister, MD;  Location: [facility] ENDOSCOPY;  Service: Endoscopy;;   FOREIGN BODY REMOVAL  12/15/2018   Procedure: FOREIGN BODY REMOVAL;  Surgeon: Gatha Mayer, MD;  Location: [facility];  Service: Endoscopy;;   FOREIGN BODY REMOVAL  02/09/2020   Procedure: FOREIGN BODY REMOVAL;  Surgeon: Lavena Bullion, DO;  Location: [place];  Service: Gastroenterology;;   FOREIGN BODY REMOVAL  10/14/2021   Procedure: FOREIGN BODY REMOVAL;  Surgeon: Daryel November, MD;  Location: [facility];  Service: Gastroenterology;;   GIVENS CAPSULE STUDY N/A 05/18/2016   Procedure: GIVENS CAPSULE STUDY;  Surgeon: Manus Gunning, MD;  Location: [place];   Service: Gastroenterology;  Laterality: N/A;   LAPAROSCOPIC APPENDECTOMY  03/30/2011   Procedure: APPENDECTOMY LAPAROSCOPIC;  Surgeon: Joyice Faster. Cornett, MD;  Location: [place];  Service: General;  Laterality: N/A;   LEFT HEART CATHETERIZATION WITH CORONARY ANGIOGRAM N/A 01/24/2013   Procedure: LEFT HEART CATHETERIZATION WITH CORONARY ANGIOGRAM;  Surgeon: [name], MD;  Location: [facility] CATH LAB;  Service: Cardiovascular;  Laterality: N/A;  Right heart with grafts   LITHOTRIPSY     "once" (01/21/2013)   [place]  05/2009   persantine myoview - fixed moderate perfusion defect in inferior wall & lateral segment of apex (poor non-transmural infarction), minimal anterolateral periinfarct reversible ischemia seen, abnormal study, defects similar to 2006 study   RIGHT/LEFT HEART CATH AND CORONARY ANGIOGRAPHY N/A 09/17/2017   Procedure: RIGHT/LEFT HEART CATH AND CORONARY ANGIOGRAPHY;  Surgeon: Martinique, Peter M, MD;  Location: [place] CV LAB;  Service: Cardiovascular;  Laterality: N/A;   SPLENECTOMY, TOTAL  01/2013   TEE WITHOUT CARDIOVERSION N/A 01/23/2013   Procedure: TRANSESOPHAGEAL ECHOCARDIOGRAM (TEE);  Surgeon: Sanda Klein, MD;  Location: [facility] ENDOSCOPY;  Service: Cardiovascular;  Laterality: N/A;   TRANSTHORACIC ECHOCARDIOGRAM  06/23/2012   EF 40-45%, mild LVH; mild AV regurg; mild MV regurg; LV mod-severely dilated; RV mildly dilated; systolic pressure borderline increased; RA mod dilated    Allergies  Allergen Reactions   Percocet [Oxycodone-Acetaminophen] Itching and Nausea Only    Current Outpatient Medications  Medication Sig Dispense Refill   acetaminophen (TYLENOL) 500 MG tablet Take 500 mg by mouth every 6 (six) hours as needed for moderate pain.     ezetimibe (ZETIA) 10 MG tablet TAKE  1 TABLET(10 MG) BY MOUTH DAILY (Patient taking differently: Take 10 mg by mouth daily.) 90 tablet 2   gabapentin (NEURONTIN) 100 MG capsule Take 100 mg by mouth at bedtime as needed  (nerve pain.).     Melatonin 10 MG TABS Take 10 mg by mouth at bedtime as needed (sleep).     rOPINIRole (REQUIP) 0.25 MG tablet TAKE 1 TABLET(0.25 MG) BY MOUTH TWICE DAILY 180 tablet 3   tiZANidine (ZANAFLEX) 4 MG tablet TAKE 1 TABLET(4 MG) BY MOUTH EVERY 8 HOURS AS NEEDED FOR MUSCLE SPASMS. DO NOT COMBINE WITH TRAMADOL (Patient taking differently: Take 4 mg by mouth at bedtime as needed for muscle spasms.) 60 tablet 1   traMADol (ULTRAM) 50 MG tablet TAKE 1/2 TABLET BY MOUTH EVERY 8 HOURS AS NEEDED FOR PAIN 30 tablet 1   apixaban (ELIQUIS) 2.5 MG TABS tablet TAKE 1 TABLET(2.5 MG) BY MOUTH TWICE DAILY 180 tablet 3   carvedilol (COREG) 3.125 MG tablet TAKE 1 TABLET(3.125 MG) BY MOUTH TWICE DAILY WITH A MEAL 180 tablet 3   levothyroxine (SYNTHROID) 25 MCG tablet TAKE 1 TABLET(25 MCG) BY MOUTH DAILY BEFORE BREAKFAST 90 tablet 3   pantoprazole (PROTONIX) 40 MG tablet TAKE 1 TABLET(40 MG) BY MOUTH TWICE DAILY 180 tablet 3   rosuvastatin (CRESTOR) 40 MG tablet TAKE 1 TABLET(40 MG) BY MOUTH DAILY 90 tablet 3   No current facility-administered medications for this visit.    Social History   Socioeconomic History   Marital status: Married    Spouse name: Evelena Peat   Number of children: 9   Years of education: 8   Highest education level: Not on file  Occupational History   Occupation: retired  Tobacco Use   Smoking status: Former    Packs/day: 2.00    Years: 20.00    Total pack years: 40.00    Types: Cigarettes    Quit date: 04/08/1986    Years since quitting: 35.9   Smokeless tobacco: Current    Types: Chew   Tobacco comments:    Encouraged cessation  Vaping Use   Vaping Use: Never used  Substance and Sexual Activity   Alcohol use: No    Alcohol/week: 0.0 standard drinks of alcohol   Drug use: No   Sexual activity: Not on file  Other Topics Concern   Not on file  Social History Narrative    married, 5 sons 4 daughters. He is retired Horticulturist, commercial. 2-3 caffeinated beverages a day no  alcohol   Social Determinants of Health   Financial Resource Strain: Low Risk  (04/15/2021)   Overall Financial Resource Strain (CARDIA)    Difficulty of Paying Living Expenses: Not hard at all  Food Insecurity: No Food Insecurity (04/15/2021)   Hunger Vital Sign    Worried About Running Out of Food in the Last Year: Never true    Ran Out of Food in the Last Year: Never true  Transportation Needs: No Transportation Needs (04/15/2021)   PRAPARE - Hydrologist (Medical): No    Lack of Transportation (Non-Medical): No  Physical Activity: Not on file  Stress: Not on file  Social Connections: Not on file  Intimate Partner Violence: Not on file    Family History  Problem Relation Age of Onset   Heart disease Father        rheumatic fever   Heart attack Brother 79   Stroke Mother    Stroke Brother 27   Stomach cancer Neg Hx  Rectal cancer Neg Hx    Prostate cancer Neg Hx    Pancreatic cancer Neg Hx    Ovarian cancer Neg Hx    ROS General: Negative; No fevers, chills, or night sweats;  HEENT: Negative; No changes in vision or hearing, sinus congestion, difficulty swallowing Pulmonary: Negative; No cough, wheezing, shortness of breath, hemoptysis Cardiovascular: See history of present illness GI: positive for GERD; No nausea, vomiting, diarrhea, or abdominal pain GU: Renal infarct Musculoskeletal: Negative; no myalgias, joint pain, or weakness Hematologic/Oncology: Negative; no easy bruising, bleeding Endocrine: Negative; no heat/cold intolerance; no diabetes Neuro: positive for remote CVA Skin: Negative; No rashes or skin lesions Psychiatric: Negative; No behavioral problems, depression Sleep: Positive for restless legs; no snoring, daytime sleepiness, hypersomnolence, bruxism, , hypnogognic hallucinations, no cataplexy Other comprehensive 14 point system review is negative.   PE BP 122/62   Pulse 78   Ht _0  (1.676 m)   Wt 184 lb (83.5 kg)    SpO2 96%   BMI 29.70 kg/m    Repeat blood pressure by me was 120/68  Wt Readings from Last 3 Encounters:  02/18/22 184 lb (83.5 kg)  12/10/21 184 lb (83.5 kg)  12/02/21 180 lb 12.8 oz (82 kg)    General: Alert, oriented, no distress.   Skin: normal turgor, no rashes, warm and dry HEENT: Normocephalic, atraumatic. Pupils equal round and reactive to light; sclera anicteric; extraocular muscles intact;  Nose without nasal septal hypertrophy Mouth/Parynx benign; Mallinpatti scale 3 Neck: Previously noted carotid bruits no JVD, normal carotid upstroke Lungs: clear to ausculatation and percussion; no wheezing or rales Chest wall: without tenderness to palpitation Heart: PMI not displaced, irregular irregular consistent with atrial fibrillation with controlled rate, s1 s2 normal, 1/6 systolic murmur, no diastolic murmur, no rubs, gallops, thrills, or heaves Abdomen: soft, nontender; no hepatosplenomehaly, BS+; abdominal aorta nontender and not dilated by palpation. Back: no CVA tenderness Pulses 2+ Musculoskeletal: full range of motion, normal strength, no joint deformities Extremities: no clubbing cyanosis or edema, Homan's sign negative  Neurologic: grossly nonfocal; Cranial nerves grossly wnl Psychologic: Normal mood and affect    February 18, 2022 ECG (independently read by me): Atrial fibrillation at 78, LAD, PRWP  September 10, 2021  ECG (independently read by me): Atrial fibrillation at 54 bpm, left axis deviation: Prior inferior infarct with Q waves inferiorly and previous Q waves anteriorly V1-6  August 28, 2020 ECG (independently read by me):  Atrial fibrillation at 70; LAD, Inferior Q waves, QS V1-20 May 2019 ECG (independently read by me): Atrial fibrillation at 72 bpm, isolated PVC.  QRS voltage.  Inferior Q waves.  QS complex V1 through V6.  September 2020 ECG (independently read by me): Atrial fibrillation with ventricular rate at 50 bpm.  QS complex V1 through V4  March  2020 ECG (independently read by me): Atrial fibrillation at 66 bpm.  Right axis deviation.  Poor anterior R wave progression V1 through V5.  DC interval for 36 ms.  January 13, 2018 ECG (independently read by me): Atrial fibrillation at 65 bpm.  Inferior Q waves.  Poor anterior R wave progression V1 through V4  August 2019 ECG (independently read by me): AF vs junctional at 84; old anterolateral MI; QTc 497 msec  July 2018 ECG (independently read by me): Atrial fibrillation at 60 bpm.  Low voltage.  Probable old anterolateral MI with poor anterior R-wave progression.  Small inferior Q waves.  December 2017 ECG (independently read by me): Atrial fibrillation  with a rate at 58 bpm.  QS complex anteriorly.  Low voltage.  September 2017 ECG (independently read by me): Atrial fibrillation with premature ventricular beats.  Poor R wave progression anteriorly.  Small nondiagnostic inferior Q waves.  December 2016 ECG (independently read by me): atrial fibrillation with rate in the 60s with occasional PVC.  QTc interval 452 ms  August 2016 ECG (independently read by me): Atrial fibrillation at 67 bpm.  2 ventricular premature beats.  Poor R-wave progression  May 2016 ECG (independently read by me): Atrial fibrillation with occasional PVCs.  Heart rate 72.  ECG (independently read by me): Atrial fibrillation with controlled ventricular response in the 70s.  Poor anterior R-wave progression.  Prior April 2015 ECG (independently read by me): Atrial fibrillation at 56 beats per minute.  QTc interval 395 ms  Prior 02/23/2013 ECG: Atrial fibrillation at 80 beats per minute with isolated PVC.  LABS:    Latest Ref Rng & Units 11/19/2021   12:00 AM 10/14/2021    4:09 PM 06/02/2021    2:49 PM  BMP  Glucose 70 - 99 mg/dL  88  88   BUN 4 - 21 43  41  18   Creatinine 0.6 - 1.3 1.6  1.71  1.43   BUN/Creat Ratio 10 - 24   13   Sodium 137 - 147 141  144  140   Potassium 3.5 - 5.1 mEq/L 4.9  4.5  4.6    Chloride 99 - 108 106  113  105   CO2 13 - _0 Calcium 8.7 - 10.7 9.9  9.4  8.9       Latest Ref Rng & Units 11/19/2021   12:00 AM 10/14/2021    4:09 PM 06/02/2021    2:49 PM  Hepatic Function  Total Protein 6.5 - 8.1 g/dL  7.7  6.8   Albumin 3.5 - 5.0 4.1  3.8  3.5   AST 15 - 41 U/L  32  31   ALT 0 - 44 U/L  34  21   Alk Phosphatase 38 - 126 U/L  66  104   Total Bilirubin 0.3 - 1.2 mg/dL  1.0  0.3       Latest Ref Rng & Units 10/14/2021    4:09 PM 04/21/2021    2:28 PM 04/17/2021    2:29 AM  CBC  WBC 4.0 - 10.5 K/uL 9.8  7.0  10.8   Hemoglobin 13.0 - 17.0 g/dL 12.9  9.1  8.2   Hematocrit 39.0 - 52.0 % 39.7  28.2  24.9   Platelets 150 - 400 K/uL 165  426.0  278    Lab Results  Component Value Date   MCV 96.8 10/14/2021   MCV 93.5 04/21/2021   MCV 94.0 04/17/2021   Lab Results  Component Value Date   TSH 4.43 12/10/2021   Lab Results  Component Value Date   HGBA1C 6.3 12/05/2020   Lipid Panel     Component Value Date/Time   CHOL 126 12/10/2021 1326   CHOL 136 05/18/2018 1355   TRIG 137.0 12/10/2021 1326   HDL 57.00 12/10/2021 1326   HDL 56 05/18/2018 1355   CHOLHDL 2 12/10/2021 1326   VLDL 27.4 12/10/2021 1326   LDLCALC 41 12/10/2021 1326   LDLCALC 67 11/22/2019 1320     IMPRESSION:  1. Permanent atrial fibrillation ([place])   2. Hx of CABG   3. Cardiomyopathy, ischemic, improved  4. Anticoagulated on Eliquis   5. Acquired hypothyroidism   6. Dyslipidemia   7. Bilateral carotid artery stenosis   8. Hyperlipidemia with target LDL less than 70      ASSESSMENT AND PLAN: Mr. Graison Leinberger is an 88 year old gentleman who is status post CABG revascularization surgery in 2003. He has a history of an ischemic cardiomyopathy with an initial ejection fraction of 25% with subsequent normalization at 55-60%; however, on his echo Doppler study during his July 2019 hospitalization EF was again 25 to 30%.  There was mild AR, mild to moderate MR, biatrial  enlargement and mild TR.  He had akinesis of the distal anteroseptal wall and apex.  He has a remote history of a small right brain CVA in 2005  with subsequent recovery. 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  In the past he was documented to have prior thrombus in his left atrium and had been on Xarelto.  When I last saw him he was on a reduced dose of Eliquis in light of his age greater than 34 and renal dysfunction.  He has documented high-grade carotid disease and was followed by Dr. Oneida Alar but had no interest in pursuing surgery or stenting.  Presently, he continues to do remarkably well.  He remains active.  He has longstanding persistent atrial fibrillation and is on anticoagulation at reduced dose due to his CKD.  He is followed by Dr. Posey Pronto.  He is asymptomatic with reference to chest pain or shortness of breath.  Lipid studies are excellent with LDL 41 on combination therapy with Zetia 10 mg and rosuvastatin 40 mg.  He is on low-dose levothyroxine for hypothyroidism.  He is followed by Dr. Charise Carwin for primary care who checks laboratory.  He is asymptomatic with reference to his documented carotid stenosis.  I will see him in 6 to 8 months for follow-up evaluation or sooner as needed.    Troy Sine, MD, [facility]  02/25/2022 5:58 PM

## 2022-02-18 NOTE — Patient Instructions (Signed)
Medication Instructions:  Your physician recommends that you continue on your current medications as directed. Please refer to the Current Medication list given to you today.  *If you need a refill on your cardiac medications before your next appointment, please call your pharmacy*   Lab Work: NONE If you have labs (blood work) drawn today and your tests are completely normal, you will receive your results only by: [place] (if you have MyChart) OR A paper copy in the mail If you have any lab test that is abnormal or we need to change your treatment, we will call you to review the results.   Testing/Procedures: NONE   Follow-Up: At [facility], you and your health needs are our priority.  As part of our continuing mission to provide you with exceptional heart care, we have created designated Provider Care Teams.  These Care Teams include your primary Cardiologist (physician) and Advanced Practice Providers (APPs -  Physician Assistants and Nurse Practitioners) who all work together to provide you with the care you need, when you need it.  We recommend signing up for the patient portal called "MyChart".  Sign up information is provided on this After Visit Summary.  MyChart is used to connect with patients for Virtual Visits (Telemedicine).  Patients are able to view lab/test results, encounter notes, upcoming appointments, etc.  Non-urgent messages can be sent to your provider as well.   To learn more about what you can do with MyChart, go to [email or web address].    Your next appointment:   6-8 month(s)  The format for your next appointment:   In Person  Provider:   Shelva Majestic, MD

## 2022-02-25 ENCOUNTER — Encounter: Payer: Self-pay | Admitting: Cardiovascular Disease

## 2022-04-29 ENCOUNTER — Telehealth: Payer: Self-pay

## 2022-04-29 NOTE — Patient Outreach (Signed)
  Care Coordination   Initial Visit Note   04/29/2022 Name: Gregory [name] MRN: [id number] DOB: Aug 14, 1932  Gregory [name] is a 89 y.o. year old male who sees [name], [name], MD for primary care. I spoke with daughterCynthia [name](dpr) by phone today.  What matters to the patients health and wellness today?  Daughter reports that patient is doing well. She reports very supportive family. Patient is still very active for his age. Daughter ask about process for obtaining motorized chair, if they decide to pursue this in the future. Daughter to contact RNCM if care coordination needs in the future.    Goals Addressed             This Visit's Progress    COMPLETED: Patient Stated       Interventions Today    Flowsheet Row Most Recent Value  General Interventions   General Interventions Discussed/Reviewed General Interventions Discussed, Durable Medical Equipment (DME)  [Care Coordination Program discussed. Patient's daughter advised to contact Care Coordinator if care coordination needs in the future. contact number provided]  Durable Medical Equipment (DME) Wheelchair, Other, Neurosurgeon  [discussed process for obtaining motorized scooter. Advised to contact insurance re: coverage,  assessment needed by PT and form/order by provider. DME company contact information-[employer],  [employer] and [employer] provided]  Exercise Interventions   Exercise Discussed/Reviewed Physical Activity  [Daughter reports patient continues to be active and engaged in activities in and outside the house.]  Physical Activity Discussed/Reviewed Physical Activity Discussed  [encouraged to keep active to maintain strength and muscle tone]  Safety Interventions   Safety Discussed/Reviewed Safety Discussed             SDOH assessments and interventions completed:  Yes  SDOH Interventions Today    Flowsheet Row Most Recent Value   SDOH Interventions   Food Insecurity Interventions Intervention Not Indicated  Housing Interventions Intervention Not Indicated  Transportation Interventions Intervention Not Indicated  Utilities Interventions Intervention Not Indicated     Care Coordination Interventions:  Yes, provided   Follow up plan: No further intervention required.   Encounter Outcome:  Pt. Visit Completed  Thea Silversmith, RN, MSN, BSN, [place] Coordinator [phone]

## 2022-05-09 ENCOUNTER — Other Ambulatory Visit: Payer: Self-pay | Admitting: Family Medicine

## 2022-05-09 DIAGNOSIS — G2581 Restless legs syndrome: Secondary | ICD-10-CM

## 2022-05-11 DIAGNOSIS — N183 Chronic kidney disease, stage 3 unspecified: Secondary | ICD-10-CM | POA: Diagnosis not present

## 2022-05-20 DIAGNOSIS — I129 Hypertensive chronic kidney disease with stage 1 through stage 4 chronic kidney disease, or unspecified chronic kidney disease: Secondary | ICD-10-CM | POA: Diagnosis not present

## 2022-05-20 DIAGNOSIS — I639 Cerebral infarction, unspecified: Secondary | ICD-10-CM | POA: Diagnosis not present

## 2022-05-20 DIAGNOSIS — N183 Chronic kidney disease, stage 3 unspecified: Secondary | ICD-10-CM | POA: Diagnosis not present

## 2022-05-20 DIAGNOSIS — N2581 Secondary hyperparathyroidism of renal origin: Secondary | ICD-10-CM | POA: Diagnosis not present

## 2022-05-20 DIAGNOSIS — D631 Anemia in chronic kidney disease: Secondary | ICD-10-CM | POA: Diagnosis not present

## 2022-06-10 ENCOUNTER — Ambulatory Visit: Payer: Medicare Other | Admitting: Family Medicine

## 2022-06-13 NOTE — Progress Notes (Unsigned)
[place] at [facility] [address] [place], [place] [address] [phone]  Date:  06/17/2022   Name:  Gregory [name]   DOB:  Dec 27, 1932   MRN:  [id number]  PCP:  Darreld Mclean, MD    Chief Complaint: No chief complaint on file.   History of Present Illness:  Gregory [name] is a 89 y.o. very pleasant male patient who presents with the following:  Patient seen today for periodic follow-up Most recent visit with myself was in September  Patient with history of CAD status post CABG 2003, A. fib, cardiomyopathy, CVA 2005, PVD, carotid stenosis, renal infarct 2017 during temporary Xarelto hold, esophageal stricture status post dilation, hypothyroidism, chronic renal insufficiency, prediabetes, chronic anemia, secondary hyperparathyroidism   Last visit he was doing fairly well.  He was able to do outdoor chores which he enjoys and had gained back the weight he lost due to his esophageal stricture  Since our last visit he was seen by his cardiologist, Dr. Ellouise Newer in December-condition stable He is anticoagulated with Xarelto on reduced dose due to age and renal function He is also followed by Dr. Ron Agee for his spinal stenosis, Dr. Posey Pronto is his nephrologist, Dr. Carlean Purl is his gastroenterologist  He was seen by Dr. Posey Pronto about 2 weeks ago, he did approve use of as needed Lasix as needed for weight gain  Most recent blood work on chart from September, creatinine at that time 1.6 BNP was mildly elevated in September, 390.  Lipids look good at that time Thyroid in normal range in September Patient Active Problem List   Diagnosis Date Noted   Fever 04/17/2021   Protein-calorie malnutrition, severe 04/15/2021   Pressure injury of sacral region, stage 1 04/14/2021   Acute on chronic systolic CHF (congestive heart failure) ([place]) 04/13/2021   Chronic kidney disease, stage 3a ([place]) 04/13/2021   Chronic diastolic CHF (congestive  heart failure) ([place]) 04/06/2021   Essential hypertension 04/06/2021   Mixed hyperlipidemia 04/06/2021   Elevated liver enzymes 04/06/2021   Anemia, unspecified 04/06/2021   Hypothyroidism 04/06/2021   Restless leg syndrome 04/06/2021   Failure to thrive in adult 04/06/2021   Other cirrhosis of liver ([place]) 04/06/2021   GERD without esophagitis 01/26/2019   Hypothyroid 12/12/2018   Prediabetes 12/04/2018   Chronic anemia 01/13/2016   Chronic renal failure, stage 3 (moderate) ([place]) 08/06/2015   Vasovagal syncope    Renal infarction ([place]) 07/31/2015   Syncope 07/30/2015   Esophageal dysphagia    Carotid stenosis 07/13/2013   Anticoagulated on Eliquis [id number]   PVD - [id number] LICA, [id number] RICA by doppler 5/14 01/24/2013   Thrombus of left atrial appendage on TEE 01/23/13 01/24/2013   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 03/31/2011   Atrial fibrillation, chronic ([place]) 03/31/2011   Cardiomyopathy, ischemic, improved 03/31/2011   CVA (cerebral vascular accident) ([place]) 03/31/2011   Renal calculi 03/31/2011   History of tobacco use, continues with chewing tobacco 03/31/2011    Past Medical History:  Diagnosis Date   Acute respiratory failure ([place])    Appendicitis with abscess 03/31/2011   Arthritis    CAD (coronary artery disease),CABG 1993-LIMA to the LAD, SVG to Om and SVG to PDA/PLA, patent on cath 2008. 2003   a. s/p CABG 2003. b. last cath 2008 with patent grafts.   Chronic atrial  fibrillation ([place])    permanent   Chronic systolic CHF (congestive heart failure) ([place])    a. Prior low EF, later normalized.   COVID 03/14/2021   Esophageal obstruction due to food impaction    Esophageal stricture    Essential hypertension    Food impaction of esophagus    GERD (gastroesophageal reflux disease)    Heart murmur    Hyperlipidemia    Ischemic cardiomyopathy    Kidney stones    "passed  all but one time when he had to have lithotripsy" (01/21/2013)   Renal infarct ([place])    a. 07/2015 - after holding Xarelto x 3 days for spinal procedure.   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 felt vasovagal in setting of pain from renal infarct.    Past Surgical History:  Procedure Laterality Date   BALLOON DILATION N/A 05/22/2015   Procedure: BALLOON DILATION;  Surgeon: Gatha Mayer, MD;  Location: [facility] ENDOSCOPY;  Service: Endoscopy;  Laterality: N/A;   BALLOON DILATION N/A 12/15/2018   Procedure: BALLOON DILATION;  Surgeon: Gatha Mayer, MD;  Location: [facility] ENDOSCOPY;  Service: Endoscopy;  Laterality: N/A;   BALLOON DILATION N/A 10/14/2021   Procedure: BALLOON DILATION;  Surgeon: Daryel November, MD;  Location: [facility];  Service: Gastroenterology;  Laterality: N/A;   BALLOON DILATION N/A 10/24/2021   Procedure: BALLOON DILATION;  Surgeon: Gatha Mayer, MD;  Location: [facility];  Service: Gastroenterology;  Laterality: N/A;   CARDIAC CATHETERIZATION  02/24/2002   reduced LV function, 60-70% prox RCA stenosis, 70% PLA ostial stenosis, 80% secondary branch of PLA stenosis - subsequent CABG (Dr. Jackie Plum)   Carotid Doppler  06/2012   50-69% right bulb/prox ICA diameter reduction; 70-99% left bulb/prox ICA diameter reduction   CATARACT EXTRACTION W/ INTRAOCULAR LENS IMPLANT Bilateral 2013   COLONOSCOPY N/A 05/18/2016   Procedure: COLONOSCOPY;  Surgeon: Manus Gunning, MD;  Location: [facility] ENDOSCOPY;  Service: Gastroenterology;  Laterality: N/A;   CORONARY ANGIOPLASTY  07/05/2006   3 vessel CAD, patent LIMA to LAD, patentVG to OM, patent SVG to PDA & PLA, mild MR, severe LV systolic dysfunction (Dr. Gerrie Nordmann)   CORONARY ARTERY BYPASS GRAFT  03/01/2002   LIMA to LAD, reverse SVG to OM, reverse SVG to PDA of RCA, reverse SVG to PLA of RCA,  ligation of LA appendage (Dr. Servando Snare)   ENTEROSCOPY N/A 05/20/2016   Procedure: ENTEROSCOPY;  Surgeon: Manus Gunning, MD;  Location: [place];  Service: Gastroenterology;  Laterality: N/A;   ESOPHAGOGASTRODUODENOSCOPY  02/01/2012   Procedure: ESOPHAGOGASTRODUODENOSCOPY (EGD);  Surgeon: Beryle Beams, MD;  Location: [facility];  Service: Endoscopy;  Laterality: N/A;   ESOPHAGOGASTRODUODENOSCOPY N/A 05/22/2015   Procedure: ESOPHAGOGASTRODUODENOSCOPY (EGD);  Surgeon: Gatha Mayer, MD;  Location: [facility];  Service: Endoscopy;  Laterality: N/A;   ESOPHAGOGASTRODUODENOSCOPY N/A 05/17/2016   Procedure: ESOPHAGOGASTRODUODENOSCOPY (EGD);  Surgeon: Juanita Craver, MD;  Location: [facility] ENDOSCOPY;  Service: Endoscopy;  Laterality: N/A;   ESOPHAGOGASTRODUODENOSCOPY N/A 09/17/2017   Procedure: ESOPHAGOGASTRODUODENOSCOPY (EGD);  Surgeon: Milus Banister, MD;  Location: [facility] ENDOSCOPY;  Service: Endoscopy;  Laterality: N/A;   ESOPHAGOGASTRODUODENOSCOPY Left 02/09/2020   Procedure: ESOPHAGOGASTRODUODENOSCOPY (EGD);  Surgeon: Lavena Bullion, DO;  Location[facility] ENDOSCOPY;  Service: Gastroenterology;  Laterality: Left;   ESOPHAGOGASTRODUODENOSCOPY (EGD) WITH PROPOFOL N/A 12/15/2018   Procedure: ESOPHAGOGASTRODUODENOSCOPY (EGD)  WITH PROPOFOL;  Surgeon: Gatha Mayer, MD;  Location: [facility];  Service: Endoscopy;  Laterality: N/A;   ESOPHAGOGASTRODUODENOSCOPY (EGD) WITH PROPOFOL N/A 04/07/2021   Procedure: ESOPHAGOGASTRODUODENOSCOPY (EGD) WITH PROPOFOL;  Surgeon: Milus Banister, MD;  Location: [facility] ENDOS[facility];  Service: Endoscopy;  Laterality: N/A;   ESOPHAGOGASTRODUODENOSCOPY (EGD) WITH PROPOFOL N/A 10/14/2021   Procedure: ESOPHAGOGASTRODUODENOSCOPY (EGD) WITH PROPOFOL;  Surgeon: Daryel November, MD;  Location: [facility];  Service: Gastroenterology;  Laterality: N/A;   ESOPHAGOGASTRODUODENOSCOPY (EGD) WITH PROPOFOL N/A 10/24/2021   Procedure: ESOPHAGOGASTRODUODENOSCOPY (EGD) WITH PROPOFOL;   Surgeon: Gatha Mayer, MD;  Location: [facility] ENDOS[facility];  Service: Gastroenterology;  Laterality: N/A;   FOREIGN BODY REMOVAL  09/17/2017   Procedure: FOREIGN BODY REMOVAL;  Surgeon: Milus Banister, MD;  Location: [facility] ENDOSCOPY;  Service: Endoscopy;;   FOREIGN BODY REMOVAL  12/15/2018   Procedure: FOREIGN BODY REMOVAL;  Surgeon: Gatha Mayer, MD;  Location: [facility] ENDOSCOPY;  Service: Endoscopy;;   FOREIGN BODY REMOVAL  02/09/2020   Procedure: FOREIGN BODY REMOVAL;  Surgeon: Lavena Bullion, DO;  Location: [place];  Service: Gastroenterology;;   FOREIGN BODY REMOVAL  10/14/2021   Procedure: FOREIGN BODY REMOVAL;  Surgeon: Daryel November, MD;  Location: [facility] ENDOS[facility];  Service: Gastroenterology;;   GIVENS CAPSULE STUDY N/A 05/18/2016   Procedure: GIVENS CAPSULE STUDY;  Surgeon: Manus Gunning, MD;  Location: [place];  Service: Gastroenterology;  Laterality: N/A;   LAPAROSCOPIC APPENDECTOMY  03/30/2011   Procedure: APPENDECTOMY LAPAROSCOPIC;  Surgeon: Joyice Faster. Cornett, MD;  Location: [place];  Service: General;  Laterality: N/A;   LEFT HEART CATHETERIZATION WITH CORONARY ANGIOGRAM N/A 01/24/2013   Procedure: LEFT HEART CATHETERIZATION WITH CORONARY ANGIOGRAM;  Surgeon: [name], MD;  Location: [facility] CATH LAB;  Service: Cardiovascular;  Laterality: N/A;  Right heart with grafts   LITHOTRIPSY     "once" (01/21/2013)   [place]  05/2009   persantine myoview - fixed moderate perfusion defect in inferior wall & lateral segment of apex (poor non-transmural infarction), minimal anterolateral periinfarct reversible ischemia seen, abnormal study, defects similar to 2006 study   RIGHT/LEFT HEART CATH AND CORONARY ANGIOGRAPHY N/A 09/17/2017   Procedure: RIGHT/LEFT HEART CATH AND CORONARY ANGIOGRAPHY;  Surgeon: Martinique, Peter M, MD;  Location: [place] CV LAB;  Service: Cardiovascular;  Laterality: N/A;   SPLENECTOMY, TOTAL  01/2013   TEE WITHOUT CARDIOVERSION N/A  01/23/2013   Procedure: TRANSESOPHAGEAL ECHOCARDIOGRAM (TEE);  Surgeon: Sanda Klein, MD;  Location: [facility] ENDOSCOPY;  Service: Cardiovascular;  Laterality: N/A;   TRANSTHORACIC ECHOCARDIOGRAM  06/23/2012   EF 40-45%, mild LVH; mild AV regurg; mild MV regurg; LV mod-severely dilated; RV mildly dilated; systolic pressure borderline increased; RA mod dilated    Social History   Tobacco Use   Smoking status: Former    Packs/day: 2.00    Years: 20.00    Additional pack years: 0.00    Total pack years: 40.00    Types: Cigarettes    Quit date: 04/08/1986    Years since quitting: 36.2   Smokeless tobacco: Current    Types: Chew   Tobacco comments:    Encouraged cessation  Vaping Use   Vaping Use: Never used  Substance Use Topics   Alcohol use: No    Alcohol/week: 0.0 standard drinks of alcohol   Drug use: No    Family History  Problem Relation Age of Onset   Heart disease Father        rheumatic fever  Heart attack Brother 64   Stroke Mother    Stroke Brother 25   Stomach cancer Neg Hx    Rectal cancer Neg Hx    Prostate cancer Neg Hx    Pancreatic cancer Neg Hx    Ovarian cancer Neg Hx     Allergies  Allergen Reactions   Percocet [Oxycodone-Acetaminophen] Itching and Nausea Only    Medication list has been reviewed and updated.  Current Outpatient Medications on File Prior to Visit  Medication Sig Dispense Refill   acetaminophen (TYLENOL) 500 MG tablet Take 500 mg by mouth every 6 (six) hours as needed for moderate pain.     apixaban (ELIQUIS) 2.5 MG TABS tablet TAKE 1 TABLET(2.5 MG) BY MOUTH TWICE DAILY 180 tablet 3   carvedilol (COREG) 3.125 MG tablet TAKE 1 TABLET(3.125 MG) BY MOUTH TWICE DAILY WITH A MEAL 180 tablet 3   ezetimibe (ZETIA) 10 MG tablet TAKE 1 TABLET(10 MG) BY MOUTH DAILY (Patient taking differently: Take 10 mg by mouth daily.) 90 tablet 2   gabapentin (NEURONTIN) 100 MG capsule Take 100 mg by mouth at bedtime as needed (nerve pain.).      levothyroxine (SYNTHROID) 25 MCG tablet TAKE 1 TABLET(25 MCG) BY MOUTH DAILY BEFORE BREAKFAST 90 tablet 3   Melatonin 10 MG TABS Take 10 mg by mouth at bedtime as needed (sleep).     pantoprazole (PROTONIX) 40 MG tablet TAKE 1 TABLET(40 MG) BY MOUTH TWICE DAILY 180 tablet 3   rOPINIRole (REQUIP) 0.25 MG tablet Take 1 tablet (0.25 mg total) by mouth in the morning and at bedtime. 180 tablet 1   rosuvastatin (CRESTOR) 40 MG tablet TAKE 1 TABLET(40 MG) BY MOUTH DAILY 90 tablet 3   tiZANidine (ZANAFLEX) 4 MG tablet TAKE 1 TABLET(4 MG) BY MOUTH EVERY 8 HOURS AS NEEDED FOR MUSCLE SPASMS. DO NOT COMBINE WITH TRAMADOL (Patient taking differently: Take 4 mg by mouth at bedtime as needed for muscle spasms.) 60 tablet 1   traMADol (ULTRAM) 50 MG tablet TAKE 1/2 TABLET BY MOUTH EVERY 8 HOURS AS NEEDED FOR PAIN 30 tablet 1   No current facility-administered medications on file prior to visit.    Review of Systems:  As per HPI- otherwise negative.   Physical Examination: There were no vitals filed for this visit. There were no vitals filed for this visit. There is no height or weight on file to calculate BMI. Ideal Body Weight:    GEN: no acute distress. HEENT: Atraumatic, Normocephalic.  Ears and Nose: No external deformity. CV: RRR, No M/G/R. No JVD. No thrill. No extra heart sounds. PULM: CTA B, no wheezes, crackles, rhonchi. No retractions. No resp. distress. No accessory muscle use. ABD: S, NT, ND, +BS. No rebound. No HSM. EXTR: No c/c/e PSYCH: Normally interactive. Conversant.    Assessment and Plan: ***  Signed Lamar Blinks, MD

## 2022-06-13 NOTE — Patient Instructions (Incomplete)
It was great to see you again today, take care and please see me in 6 months assuming all is well.  I recommend getting the shingles vaccine series if not done already.  Also would suggest a COVID booster if not done in the last 6 to 12 months  Let's have you use some tramadol as needed for your back pain- ok to use miralax daily if you need to prevent constipation  I will request labs from Dr Posey Pronto

## 2022-06-17 ENCOUNTER — Ambulatory Visit (INDEPENDENT_AMBULATORY_CARE_PROVIDER_SITE_OTHER): Payer: Medicare Other | Admitting: Family Medicine

## 2022-06-17 VITALS — BP 118/60 | HR 65 | Temp 97.6°F | Resp 18 | Ht 66.0 in | Wt 181.4 lb

## 2022-06-17 DIAGNOSIS — E038 Other specified hypothyroidism: Secondary | ICD-10-CM | POA: Diagnosis not present

## 2022-06-17 DIAGNOSIS — I4819 Other persistent atrial fibrillation: Secondary | ICD-10-CM | POA: Diagnosis not present

## 2022-06-17 DIAGNOSIS — I429 Cardiomyopathy, unspecified: Secondary | ICD-10-CM

## 2022-06-17 DIAGNOSIS — M4807 Spinal stenosis, lumbosacral region: Secondary | ICD-10-CM | POA: Diagnosis not present

## 2022-06-17 DIAGNOSIS — N183 Chronic kidney disease, stage 3 unspecified: Secondary | ICD-10-CM

## 2022-06-17 DIAGNOSIS — K222 Esophageal obstruction: Secondary | ICD-10-CM | POA: Diagnosis not present

## 2022-06-17 MED ORDER — TRAMADOL HCL 50 MG PO TABS: ORAL_TABLET | ORAL | 1 refills | Status: DC

## 2022-07-03 ENCOUNTER — Other Ambulatory Visit: Payer: Self-pay | Admitting: Cardiovascular Disease

## 2022-07-22 DIAGNOSIS — M5416 Radiculopathy, lumbar region: Secondary | ICD-10-CM | POA: Diagnosis not present

## 2022-10-21 ENCOUNTER — Encounter: Payer: Self-pay | Admitting: Physician Assistant

## 2022-10-21 ENCOUNTER — Ambulatory Visit: Payer: Medicare Other | Attending: Physician Assistant | Admitting: Physician Assistant

## 2022-10-21 VITALS — BP 141/64 | HR 68 | Ht 66.0 in | Wt 178.4 lb

## 2022-10-21 DIAGNOSIS — E785 Hyperlipidemia, unspecified: Secondary | ICD-10-CM

## 2022-10-21 DIAGNOSIS — I255 Ischemic cardiomyopathy: Secondary | ICD-10-CM | POA: Diagnosis not present

## 2022-10-21 DIAGNOSIS — Z8673 Personal history of transient ischemic attack (TIA), and cerebral infarction without residual deficits: Secondary | ICD-10-CM

## 2022-10-21 DIAGNOSIS — I10 Essential (primary) hypertension: Secondary | ICD-10-CM

## 2022-10-21 DIAGNOSIS — I6523 Occlusion and stenosis of bilateral carotid arteries: Secondary | ICD-10-CM

## 2022-10-21 DIAGNOSIS — I25810 Atherosclerosis of coronary artery bypass graft(s) without angina pectoris: Secondary | ICD-10-CM | POA: Diagnosis not present

## 2022-10-21 DIAGNOSIS — I4821 Permanent atrial fibrillation: Secondary | ICD-10-CM

## 2022-10-21 MED ORDER — EZETIMIBE 10 MG PO TABS: 10.0000 mg | ORAL_TABLET | Freq: Every day | ORAL | 3 refills | Status: DC

## 2022-10-21 MED ORDER — CARVEDILOL 3.125 MG PO TABS: ORAL_TABLET | ORAL | 3 refills | Status: DC

## 2022-10-21 MED ORDER — PANTOPRAZOLE SODIUM 40 MG PO TBEC: DELAYED_RELEASE_TABLET | ORAL | 0 refills | Status: DC

## 2022-10-21 MED ORDER — APIXABAN 2.5 MG PO TABS: 2.5000 mg | ORAL_TABLET | Freq: Two times a day (BID) | ORAL | 0 refills | Status: DC

## 2022-10-21 NOTE — Progress Notes (Signed)
Cardiology Office Note:  .   Date:  10/21/2022  ID:  Gregory [name], DOB 11-30-1932, MRN [id number] PCP: Pearline Cables, MD  [facility] Providers Cardiologist:  Nicki Guadalajara, MD { Nephrologist: Dr. Allena Katz  History of Present Illness: .   Gregory [name] is a 89 y.o. male with PMH of CAD s/p CABG 2003 (LIMA-LAD, SVG-OM, sequential SVG-PDA-PLA of RCA), ischemic cardiomyopathy declined ICD, permanent atrial fibrillation, hypertension, hyperlipidemia, carotid artery disease followed by vascular surgery, history of CVA and history of sick sinus syndrome.  Cardiac catheterization performed in 2008 showed patent grafts.  EF was 20 to 25% at time.  He declined ICD.  EF improved to 35 to 40% by September 2012 on medical therapy.  In 2013, he was hospitalized with appendicitis and abscess requiring laparoscopic appendectomy.  He was hospitalized again in November 2014 due to splenic infarct of embolic etiology.  He underwent TEE on 01/23/2013 that showed EF 15 to 25%, diffuse hypokinesis, severe hypokinesis to akinesis of the anteroseptal and apical myocardium, increased LVEDP, there was also evidence for a large solid fixed thrombus occupying the entire atrial appendage was moderate continuous spontaneous echo contrast in the left atrial cavity, no evidence of right atrial thrombus.  Patient refused LifeVest and was placed on Xarelto for anticoagulation therapy.  Echocardiogram in April 2015 showed significant improvement in EF to 50 to 55% without regional wall motion abnormality, mild aortic root dilatation, mild AI and mild MR. He has refused any potential carotid artery intervention with surgery.  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  Echocardiogram at the time showed EF 55 to 60%.  He was hospitalized in July 2019 was acute decompensated heart failure, EF dropped down to 25 to 30% with diffuse hypokinesis.   He underwent repeat cardiac catheterization that showed occlusion of his native vessels but patent LIMA-LAD, patent SVG-OM 2, patent sequential SVG to PDA/PLA.  He was hospitalized again in January 2023 with acute on chronic heart failure.  I saw the patient for follow-up in March 2023, creatinine increased to 1.6, Lasix was reduced to 20 mg every other day.  Last blood work obtained in February 2024 showed creatinine 1.54.  His last lipid panel obtained in September 2023 showed a very well-controlled cholesterol with LDL of 41.  He is due for repeat lipid panel this year.  He wished to have his annual blood work to be obtained by his PCP.  He denies any recent chest pain, shortness of breath or dizziness.  He remains active raising chickens.  I will refill his Eliquis, carvedilol and Zetia.  As long as his creatinine is greater than 1.5, he would qualify for the 2.5 mg twice a day of Zetia.  Unfortunately he is going into the donut hole earlier this year, I will give him some samples for Eliquis.  He can follow-up with Dr. Tresa Endo in a month.    ROS:   He denies chest pain, palpitations, dyspnea, pnd, orthopnea, n, v, dizziness, syncope, edema, weight gain, or early satiety. All other systems reviewed and are otherwise negative except as noted above.    Studies Reviewed: .        Cardiac Studies & Procedures   CARDIAC CATHETERIZATION  CARDIAC CATHETERIZATION 09/17/2017  Narrative  Prox RCA to Mid RCA lesion is 100% stenosed.  1st RPLB lesion is 100% stenosed.  Prox Cx to  Mid Cx lesion is 100% stenosed.  Prox LAD lesion is 100% stenosed.  LIMA graft was visualized by angiography and is normal in caliber.  The graft exhibits no disease.  SVG graft was visualized by angiography and is normal in caliber.  Prox Graft lesion is 45% stenosed.  Seq SVG- PDA and PLOM graft was visualized by angiography and is normal in caliber.  LV end diastolic pressure is normal.  1. 3 vessel occlusive  CAD- graft dependent. 2. Patent LIMA to the LAD 3. Patent SVG to OM2 4. Patent sequential SVG to PDA/PLOM 5. Normal LV filling pressures 6. Normal right heart pressures and cardiac output.  Plan: continue medical therapy. Resume anticoagulation. Will resume Eliquis 2.5 mg bid this evening.  No indication for antiplatelet therapy at this time.  Findings Coronary Findings Diagnostic  Dominance: Right  Left Main Vessel was injected. Vessel is normal in caliber. Vessel is angiographically normal.  Left Anterior Descending Prox LAD lesion is 100% stenosed.  Left Circumflex Prox Cx to Mid Cx lesion is 100% stenosed.  First Obtuse Marginal Branch Vessel is small in size.  Right Coronary Artery Prox RCA to Mid RCA lesion is 100% stenosed. The lesion is chronically occluded.  First Right Posterolateral Branch 1st RPLB lesion is 100% stenosed.  LIMA LIMA Graft To Mid LAD LIMA graft was visualized by angiography and is normal in caliber. The graft exhibits no disease.  Saphenous Graft To 2nd Mrg SVG graft was visualized by angiography and is normal in caliber. Prox Graft lesion is 45% stenosed.  Sequential Jump Graft Graft To RPDA, 1st RPL Seq SVG- PDA and PLOM graft was visualized by angiography and is normal in caliber.  Intervention  No interventions have been documented.   STRESS TESTS  NM MYOCAR MULTI W/SPECT W 04/16/2004   ECHOCARDIOGRAM  ECHOCARDIOGRAM COMPLETE 04/14/2021  Narrative ECHOCARDIOGRAM REPORT    Patient Name:   Gregory [name] Date of Exam: 04/14/2021 Medical Rec #:  [id number]       Height:       66.0 in Accession #:    [id number]      Weight:       177.9 lb Date of Birth:  06-20-1932       BSA:          1.903 m Patient Age:    89 years        BP:           141/60 mmHg Patient Gender: M               HR:           67 bpm. Exam Location:  Inpatient  Procedure: 2D Echo, 3D Echo, Cardiac Doppler and Color Doppler  Indications:    CHF-Acute  Systolic I50.21  History:        Patient has prior history of Echocardiogram examinations, most recent 03/02/2018. Ischemic cardiomyopathy. Murmur. Coronary artery disease. Chongetive heart failure. Stroke. Dyslipidemia. Sick sinus syndrome.  Sonographer:    Leta Jungling RDCS Referring Phys: [id number] Deno Lunger [facility]   Sonographer Comments: Suboptimal subcostal window. IMPRESSIONS   1. Left ventricular ejection fraction by 3D volume is 42 %. The left ventricle has moderately decreased function. The left ventricle has no regional wall motion abnormalities. There is mild concentric left ventricular hypertrophy. Left ventricular diastolic parameters are consistent with Grade I diastolic dysfunction (impaired relaxation). 2. Right ventricular systolic function is normal. The right ventricular size is normal. 3. Left atrial size was  severely dilated. 4. The mitral valve is normal in structure. Mild mitral valve regurgitation. No evidence of mitral stenosis. 5. The aortic valve is normal in structure. Aortic valve regurgitation is mild. Aortic valve sclerosis is present, with no evidence of aortic valve stenosis. Aortic regurgitation PHT measures 670 msec. 6. There is borderline dilatation of the aortic root, measuring 37 mm. 7. The inferior vena cava is normal in size with greater than 50% respiratory variability, suggesting right atrial pressure of 3 mmHg.  FINDINGS Left Ventricle: Left ventricular ejection fraction by 3D volume is 42 %. The left ventricle has moderately decreased function. The left ventricle has no regional wall motion abnormalities. The left ventricular internal cavity size was normal in size. There is mild concentric left ventricular hypertrophy. Left ventricular diastolic parameters are consistent with Grade I diastolic dysfunction (impaired relaxation).  Right Ventricle: The right ventricular size is normal. No increase in right ventricular wall thickness. Right  ventricular systolic function is normal.  Left Atrium: Left atrial size was severely dilated.  Right Atrium: Right atrial size was normal in size.  Pericardium: There is no evidence of pericardial effusion.  Mitral Valve: The mitral valve is normal in structure. Mild mitral valve regurgitation. No evidence of mitral valve stenosis.  Tricuspid Valve: The tricuspid valve is normal in structure. Tricuspid valve regurgitation is not demonstrated. No evidence of tricuspid stenosis.  Aortic Valve: The aortic valve is normal in structure. Aortic valve regurgitation is mild. Aortic regurgitation PHT measures 670 msec. Aortic valve sclerosis is present, with no evidence of aortic valve stenosis.  Pulmonic Valve: The pulmonic valve was normal in structure. Pulmonic valve regurgitation is not visualized. No evidence of pulmonic stenosis.  Aorta: There is borderline dilatation of the aortic root, measuring 37 mm.  Venous: The inferior vena cava is normal in size with greater than 50% respiratory variability, suggesting right atrial pressure of 3 mmHg.  IAS/Shunts: No atrial level shunt detected by color flow Doppler.   LEFT VENTRICLE PLAX 2D LVIDd:         5.00 cm         Diastology LVIDs:         3.80 cm         LV e' medial:    6.31 cm/s LV PW:         1.05 cm         LV E/e' medial:  14.1 LV IVS:        1.10 cm         LV e' lateral:   11.60 cm/s LVOT diam:     2.50 cm         LV E/e' lateral: 7.7 LV SV:         57 LV SV Index:   30 LVOT Area:     4.91 cm        3D Volume EF LV 3D EF:    Left ventricul ar ejection fraction by 3D volume is 42 %.  3D Volume EF: 3D EF:        42 % LV EDV:       141 ml LV ESV:       82 ml LV SV:        59 ml  LEFT ATRIUM              Index LA diam:        5.40 cm  2.84 cm/m LA Vol (A2C):   105.0 ml 55.18 ml/m LA  Vol (A4C):   156.0 ml 81.99 ml/m LA Biplane Vol: 135.0 ml 70.95 ml/m AORTIC VALVE LVOT Vmax:   65.30 cm/s LVOT Vmean:  44.300  cm/s LVOT VTI:    0.117 m AI PHT:      670 msec  AORTA Ao Root diam: 3.70 cm Ao Asc diam:  3.40 cm  MITRAL VALVE MV Area (PHT): 6.80 cm    SHUNTS MV Decel Time: 112 msec    Systemic VTI:  0.12 m MV E velocity: 88.90 cm/s  Systemic Diam: 2.50 cm  Kardie Tobb DO Electronically signed by Thomasene Ripple DO Signature Date/Time: 04/14/2021/12:38:44 PM    Final             Risk Assessment/Calculations:    CHA2DS2-VASc Score = 7   This indicates a 11.2% annual risk of stroke. The patient's score is based upon: CHF History: 1 HTN History: 1 Diabetes History: 0 Stroke History: 2 Vascular Disease History: 1 Age Score: 2 Gender Score: 0           Physical Exam:   VS:  BP (!) 141/64 (BP Location: Left Arm, Patient Position: Sitting, Cuff Size: Normal)   Pulse 68   Ht 5\' 6"  (1.676 m)   Wt 178 lb 6.4 oz (80.9 kg)   SpO2 98%   BMI 28.79 kg/m    Wt Readings from Last 3 Encounters:  10/21/22 178 lb 6.4 oz (80.9 kg)  06/17/22 181 lb 6.4 oz (82.3 kg)  02/18/22 184 lb (83.5 kg)    GEN: Well nourished, well developed in no acute distress NECK: No JVD; No carotid bruits CARDIAC: Irregularly irregular, no murmurs, rubs, gallops RESPIRATORY:  Clear to auscultation without rales, wheezing or rhonchi  ABDOMEN: Soft, non-tender, non-distended EXTREMITIES:  No edema; No deformity   ASSESSMENT AND PLAN: .    Permanent atrial fibrillation: Rate controlled on carvedilol.  On Eliquis.  He has a history of CVA and renal infarct of anticoagulation therapy.  He is on low-dose Eliquis due to age and renal function.  CAD s/p CABG: Denies any recent chest pain.  Ischemic cardiomyopathy: Over the years, his EF has been as low as 25% and as high as 55 to 60% in July 2017.  Most recent echocardiogram obtained in January 2023 showed EF 42%.  He appears to be euvolemic on exam.  Carotid artery disease: He has refused to consider any carotid surgery.  He denies any dizziness, blurry vision or  feeling of passing out.  Hypertension: Blood pressure stable on current therapy.  Hyperlipidemia: On Crestor and Zetia.  History of CVA: No recent recurrence.       Dispo: Follow-up with Dr. Tresa Endo in 8 month.  Signed, Azalee Course, PA

## 2022-10-21 NOTE — Patient Instructions (Signed)
Medication Instructions:  No changes *If you need a refill on your cardiac medications before your next appointment, please call your pharmacy*   Lab Work: No labs If you have labs (blood work) drawn today and your tests are completely normal, you will receive your results only by: MyChart Message (if you have MyChart) OR A paper copy in the mail If you have any lab test that is abnormal or we need to change your treatment, we will call you to review the results.   Testing/Procedures: No testing   Follow-Up: At [facility], you and your health needs are our priority.  As part of our continuing mission to provide you with exceptional heart care, we have created designated Provider Care Teams.  These Care Teams include your primary Cardiologist (physician) and Advanced Practice Providers (APPs -  Physician Assistants and Nurse Practitioners) who all work together to provide you with the care you need, when you need it.  We recommend signing up for the patient portal called "MyChart".  Sign up information is provided on this After Visit Summary.  MyChart is used to connect with patients for Virtual Visits (Telemedicine).  Patients are able to view lab/test results, encounter notes, upcoming appointments, etc.  Non-urgent messages can be sent to your provider as well.   To learn more about what you can do with MyChart, go to [email or web address].    Your next appointment:   8 month(s)  Provider:   Nicki Guadalajara, MD

## 2022-11-05 ENCOUNTER — Other Ambulatory Visit: Payer: Self-pay | Admitting: Family Medicine

## 2022-11-05 DIAGNOSIS — G2581 Restless legs syndrome: Secondary | ICD-10-CM

## 2022-11-23 ENCOUNTER — Telehealth: Payer: Self-pay | Admitting: Family Medicine

## 2022-11-23 NOTE — Telephone Encounter (Signed)
Copied from CRM [id number]. Topic: Medicare AWV >> Nov 23, 2022  1:39 PM Payton Doughty wrote: Reason for CRM: LM 11/23/2022 to schedule AWV   Verlee Rossetti; Care Guide Ambulatory Clinical Support  l [facility] Direct Dial: [phone]

## 2022-12-02 DIAGNOSIS — N2581 Secondary hyperparathyroidism of renal origin: Secondary | ICD-10-CM | POA: Diagnosis not present

## 2022-12-02 DIAGNOSIS — D631 Anemia in chronic kidney disease: Secondary | ICD-10-CM | POA: Diagnosis not present

## 2022-12-02 DIAGNOSIS — N183 Chronic kidney disease, stage 3 unspecified: Secondary | ICD-10-CM | POA: Diagnosis not present

## 2022-12-02 DIAGNOSIS — I129 Hypertensive chronic kidney disease with stage 1 through stage 4 chronic kidney disease, or unspecified chronic kidney disease: Secondary | ICD-10-CM | POA: Diagnosis not present

## 2022-12-02 DIAGNOSIS — E785 Hyperlipidemia, unspecified: Secondary | ICD-10-CM | POA: Diagnosis not present

## 2022-12-13 NOTE — Progress Notes (Unsigned)
[facility] [address] [place], [place] [address] [phone]  Date:  12/16/2022   Name:  Gregory [name]   DOB:  [birth year]   MRN:  [id number]  PCP:  Pearline Cables, MD    Chief Complaint: No chief complaint on file.   History of Present Illness:  Gregory [name] is a 89 y.o. very pleasant male patient who presents with the following:  Patient seen today for periodic follow-up Most recent visit with myself was in April  Patient with history of CAD status post CABG 2003, A. fib, cardiomyopathy, CVA 2005, PVD, carotid stenosis, renal infarct 2017 during temporary Xarelto hold, esophageal stricture status post dilation, hypothyroidism, chronic renal insufficiency, prediabetes, chronic anemia, secondary hyperparathyroidism   At our last visit Lupita Leash was using gabapentin for spinal stenosis pain, would occasionally use tramadol  Recommend flu shot, recommend Shingrix He has declined COVID vaccination  He saw cardiology in August-the note permanent A-fib, rate controlled with carvedilol, taking Eliquis at low-dose due to age and renal function.  Most recent EF 42%.  Patient has declined treatment for his carotid disease  He saw Dr. Maurice Small for his back pain in May Patient Active Problem List   Diagnosis Date Noted   Fever 04/17/2021   Protein-calorie malnutrition, severe 04/15/2021   Pressure injury of sacral region, stage 1 04/14/2021   Acute on chronic systolic CHF (congestive heart failure) (HCC) 04/13/2021   Chronic kidney disease, stage 3a (HCC) 04/13/2021   Chronic diastolic CHF (congestive heart failure) (HCC) 04/06/2021   Essential hypertension 04/06/2021   Mixed hyperlipidemia 04/06/2021   Elevated liver enzymes 04/06/2021   Anemia, unspecified 04/06/2021   Hypothyroidism 04/06/2021   Restless leg syndrome 04/06/2021   Failure to thrive in adult 04/06/2021   Other cirrhosis of liver (HCC) 04/06/2021    GERD without esophagitis 01/26/2019   Hypothyroid 12/12/2018   Prediabetes 12/04/2018   Chronic anemia 01/13/2016   Chronic renal failure, stage 3 (moderate) (HCC) 08/06/2015   Vasovagal syncope    Renal infarction (HCC) 07/31/2015   Syncope 07/30/2015   Esophageal dysphagia    Carotid stenosis 07/13/2013   Anticoagulated on Eliquis 03/21/2013   PVD - 99% LICA, 79% RICA by doppler 5/14 01/24/2013   Thrombus of left atrial appendage on TEE 01/23/13 01/24/2013   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 03/31/2011   Atrial fibrillation, chronic (HCC) 03/31/2011   Cardiomyopathy, ischemic, improved 03/31/2011   CVA (cerebral vascular accident) (HCC) 03/31/2011   Renal calculi 03/31/2011   History of tobacco use, continues with chewing tobacco 03/31/2011    Past Medical History:  Diagnosis Date   Acute respiratory failure (HCC)    Appendicitis with abscess 03/31/2011   Arthritis    CAD (coronary artery disease),CABG 1993-LIMA to the LAD, SVG to Om and SVG to PDA/PLA, patent on cath 2008. 2003   a. s/p CABG 2003. b. last cath 2008 with patent grafts.   Chronic atrial fibrillation (HCC)    permanent   Chronic systolic CHF (congestive heart failure) (HCC)    a. Prior low EF, later normalized.   COVID 03/14/2021   Esophageal obstruction due to food impaction    Esophageal stricture    Essential hypertension    Food impaction of esophagus    GERD (gastroesophageal reflux disease)    Heart murmur  Hyperlipidemia    Ischemic cardiomyopathy    Kidney stones    "passed all but one time when he had to have lithotripsy" (01/21/2013)   Renal infarct (HCC)    a. 07/2015 - after holding Xarelto x 3 days for spinal procedure.   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 felt vasovagal in setting of pain from renal infarct.    Past Surgical History:  Procedure Laterality Date   BALLOON DILATION N/A 05/22/2015   Procedure: BALLOON DILATION;  Surgeon: Iva Boop, MD;  Location: [facility] ENDOSCOPY;  Service: Endoscopy;  Laterality: N/A;   BALLOON DILATION N/A 12/15/2018   Procedure: BALLOON DILATION;  Surgeon: Iva Boop, MD;  Location: [facility] ENDOSCOPY;  Service: Endoscopy;  Laterality: N/A;   BALLOON DILATION N/A 10/14/2021   Procedure: BALLOON DILATION;  Surgeon: Jenel Lucks, MD;  Location: [facility] ENDOSCOPY;  Service: Gastroenterology;  Laterality: N/A;   BALLOON DILATION N/A 10/24/2021   Procedure: BALLOON DILATION;  Surgeon: Iva Boop, MD;  Location: [facility] ENDOSCOPY;  Service: Gastroenterology;  Laterality: N/A;   CARDIAC CATHETERIZATION  02/24/2002   reduced LV function, 60-70% prox RCA stenosis, 70% PLA ostial stenosis, 80% secondary branch of PLA stenosis - subsequent CABG (Dr. [name])   Carotid Doppler  06/2012   50-69% right bulb/prox ICA diameter reduction; 70-99% left bulb/prox ICA diameter reduction   CATARACT EXTRACTION W/ INTRAOCULAR LENS IMPLANT Bilateral 2013   COLONOSCOPY N/A 05/18/2016   Procedure: COLONOSCOPY;  Surgeon: Ruffin Frederick, MD;  Location: [facility] ENDOSCOPY;  Service: Gastroenterology;  Laterality: N/A;   CORONARY ANGIOPLASTY  07/05/2006   3 vessel CAD, patent LIMA to LAD, patentVG to OM, patent SVG to PDA & PLA, mild MR, severe LV systolic dysfunction (Dr. Laurell Josephs)   CORONARY ARTERY BYPASS GRAFT  03/01/2002   LIMA to LAD, reverse SVG to OM, reverse SVG to PDA of RCA, reverse SVG to PLA of RCA, ligation of LA appendage (Dr. Tyrone Sage)   ENTEROSCOPY N/A 05/20/2016   Procedure: ENTEROSCOPY;  Surgeon: Ruffin Frederick, MD;  Location: [facility] ENDOSCOPY;  Service: Gastroenterology;  Laterality: N/A;   ESOPHAGOGASTRODUODENOSCOPY  02/01/2012   Procedure: ESOPHAGOGASTRODUODENOSCOPY (EGD);  Surgeon: Theda Belfast, MD;   Location: [facility] ENDOSCOPY;  Service: Endoscopy;  Laterality: N/A;   ESOPHAGOGASTRODUODENOSCOPY N/A 05/22/2015   Procedure: ESOPHAGOGASTRODUODENOSCOPY (EGD);  Surgeon: Iva Boop, MD;  Location: [facility] ENDOSCOPY;  Service: Endoscopy;  Laterality: N/A;   ESOPHAGOGASTRODUODENOSCOPY N/A 05/17/2016   Procedure: ESOPHAGOGASTRODUODENOSCOPY (EGD);  Surgeon: Charna Elizabeth, MD;  Location: [facility] ENDOSCOPY;  Service: Endoscopy;  Laterality: N/A;   ESOPHAGOGASTRODUODENOSCOPY N/A 09/17/2017   Procedure: ESOPHAGOGASTRODUODENOSCOPY (EGD);  Surgeon: Rachael Fee, MD;  Location: [facility] ENDOSCOPY;  Service: Endoscopy;  Laterality: N/A;   ESOPHAGOGASTRODUODENOSCOPY Left 02/09/2020   Procedure: ESOPHAGOGASTRODUODENOSCOPY (EGD);  Surgeon: Shellia Cleverly, DO;  Location: [facility] ENDOSCOPY;  Service: Gastroenterology;  Laterality: Left;   ESOPHAGOGASTRODUODENOSCOPY (EGD) WITH PROPOFOL N/A 12/15/2018   Procedure: ESOPHAGOGASTRODUODENOSCOPY (EGD) WITH PROPOFOL;  Surgeon: Iva Boop, MD;  Location: [facility] ENDOSCOPY;  Service: Endoscopy;  Laterality: N/A;   ESOPHAGOGASTRODUODENOSCOPY (EGD) WITH PROPOFOL N/A 04/07/2021   Procedure: ESOPHAGOGASTRODUODENOSCOPY (EGD) WITH PROPOFOL;  Surgeon: Rachael Fee, MD;  Location: [facility] ENDOSCOPY;  Service: Endoscopy;  Laterality: N/A;   ESOPHAGOGASTRODUODENOSCOPY (EGD) WITH PROPOFOL N/A 10/14/2021   Procedure: ESOPHAGOGASTRODUODENOSCOPY (EGD) WITH PROPOFOL;  Surgeon: Tiajuana Amass  E, MD;  Location: [facility] ENDOSCOPY;  Service: Gastroenterology;  Laterality: N/A;   ESOPHAGOGASTRODUODENOSCOPY (EGD) WITH PROPOFOL N/A 10/24/2021   Procedure: ESOPHAGOGASTRODUODENOSCOPY (EGD) WITH PROPOFOL;  Surgeon: Iva Boop, MD;  Location: [facility] ENDOSCOPY;  Service: Gastroenterology;  Laterality: N/A;   FOREIGN BODY REMOVAL  09/17/2017   Procedure: FOREIGN BODY REMOVAL;  Surgeon: Rachael Fee, MD;  Location: [facility] ENDOSCOPY;  Service: Endoscopy;;   FOREIGN BODY REMOVAL  12/15/2018   Procedure: FOREIGN BODY REMOVAL;  Surgeon:  Iva Boop, MD;  Location: [facility] ENDOSCOPY;  Service: Endoscopy;;   FOREIGN BODY REMOVAL  02/09/2020   Procedure: FOREIGN BODY REMOVAL;  Surgeon: Shellia Cleverly, DO;  Location: [facility] ENDOSCOPY;  Service: Gastroenterology;;   FOREIGN BODY REMOVAL  10/14/2021   Procedure: FOREIGN BODY REMOVAL;  Surgeon: Jenel Lucks, MD;  Location: [facility] ENDOSCOPY;  Service: Gastroenterology;;   GIVENS CAPSULE STUDY N/A 05/18/2016   Procedure: GIVENS CAPSULE STUDY;  Surgeon: Ruffin Frederick, MD;  Location: [facility] ENDOSCOPY;  Service: Gastroenterology;  Laterality: N/A;   LAPAROSCOPIC APPENDECTOMY  03/30/2011   Procedure: APPENDECTOMY LAPAROSCOPIC;  Surgeon: Clovis Pu. Cornett, MD;  Location: [facility] OR;  Service: General;  Laterality: N/A;   LEFT HEART CATHETERIZATION WITH CORONARY ANGIOGRAM N/A 01/24/2013   Procedure: LEFT HEART CATHETERIZATION WITH CORONARY ANGIOGRAM;  Surgeon: Runell Gess, MD;  Location: [facility] CATH LAB;  Service: Cardiovascular;  Laterality: N/A;  Right heart with grafts   LITHOTRIPSY     "once" (01/21/2013)   NM MYOCAR PERF WALL MOTION  05/2009   persantine myoview - fixed moderate perfusion defect in inferior wall & lateral segment of apex (poor non-transmural infarction), minimal anterolateral periinfarct reversible ischemia seen, abnormal study, defects similar to 2006 study   RIGHT/LEFT HEART CATH AND CORONARY ANGIOGRAPHY N/A 09/17/2017   Procedure: RIGHT/LEFT HEART CATH AND CORONARY ANGIOGRAPHY;  Surgeon: Swazil[place], Peter M, MD;  Location: [facility] INVASIVE CV LAB;  Service: Cardiovascular;  Laterality: N/A;   SPLENECTOMY, TOTAL  01/2013   TEE WITHOUT CARDIOVERSION N/A 01/23/2013   Procedure: TRANSESOPHAGEAL ECHOCARDIOGRAM (TEE);  Surgeon: Thurmon Fair, MD;  Location: [facility] ENDOSCOPY;  Service: Cardiovascular;  Laterality: N/A;   TRANSTHORACIC ECHOCARDIOGRAM  06/23/2012   EF 40-45%, mild LVH; mild AV regurg; mild MV regurg; LV mod-severely dilated; RV mildly dilated; systolic pressure borderline  increased; RA mod dilated    Social History   Tobacco Use   Smoking status: Former    Current packs/day: 0.00    Average packs/day: 2.0 packs/day for 20.0 years (40.0 ttl pk-yrs)    Types: Cigarettes    Start date: 04/08/1966    Quit date: 04/08/1986    Years since quitting: 36.7   Smokeless tobacco: Current    Types: Chew   Tobacco comments:    Encouraged cessation  Vaping Use   Vaping status: Never Used  Substance Use Topics   Alcohol use: No    Alcohol/week: 0.0 standard drinks of alcohol   Drug use: No    Family History  Problem Relation Age of Onset   Heart disease Father        rheumatic fever   Heart attack Brother 65   Stroke Mother    Stroke Brother 57   Stomach cancer Neg Hx    Rectal cancer Neg Hx    Prostate cancer Neg Hx    Pancreatic cancer Neg Hx    Ovarian cancer Neg Hx     Allergies  Allergen Reactions   Percocet [Oxycodone-Acetaminophen] Itching and Nausea Only  Medication list has been reviewed and updated.  Current Outpatient Medications on File Prior to Visit  Medication Sig Dispense Refill   acetaminophen (TYLENOL) 500 MG tablet Take 500 mg by mouth every 6 (six) hours as needed for moderate pain.     apixaban (ELIQUIS) 2.5 MG TABS tablet TAKE 1 TABLET(2.5 MG) BY MOUTH TWICE DAILY 180 tablet 3   apixaban (ELIQUIS) 2.5 MG TABS tablet Take 1 tablet (2.5 mg total) by mouth 2 (two) times daily. 28 tablet 0   carvedilol (COREG) 3.125 MG tablet TAKE 1 TABLET(3.125 MG) BY MOUTH TWICE DAILY WITH A MEAL 180 tablet 3   ezetimibe (ZETIA) 10 MG tablet Take 1 tablet (10 mg total) by mouth daily. 90 tablet 3   gabapentin (NEURONTIN) 100 MG capsule Take 100 mg by mouth at bedtime as needed (nerve pain.).     levothyroxine (SYNTHROID) 25 MCG tablet TAKE 1 TABLET(25 MCG) BY MOUTH DAILY BEFORE BREAKFAST 90 tablet 3   Melatonin 10 MG TABS Take 10 mg by mouth at bedtime as needed (sleep).     pantoprazole (PROTONIX) 40 MG tablet TAKE 1 TABLET(40 MG) BY MOUTH  TWICE DAILY 180 tablet 0   rOPINIRole (REQUIP) 0.25 MG tablet Take 1 tablet (0.25 mg total) by mouth in the morning and at bedtime. 180 tablet 1   rosuvastatin (CRESTOR) 40 MG tablet TAKE 1 TABLET(40 MG) BY MOUTH DAILY 90 tablet 3   tiZANidine (ZANAFLEX) 4 MG tablet TAKE 1 TABLET(4 MG) BY MOUTH EVERY 8 HOURS AS NEEDED FOR MUSCLE SPASMS. DO NOT COMBINE WITH TRAMADOL (Patient taking differently: Take 4 mg by mouth at bedtime as needed for muscle spasms.) 60 tablet 1   traMADol (ULTRAM) 50 MG tablet TAKE 1/2 TABLET BY MOUTH EVERY 8 HOURS AS NEEDED FOR PAIN 30 tablet 1   No current facility-administered medications on file prior to visit.    Review of Systems:  As per HPI- otherwise negative.   Physical Examination: There were no vitals filed for this visit. There were no vitals filed for this visit. There is no height or weight on file to calculate BMI. Ideal Body Weight:    GEN: no acute distress. HEENT: Atraumatic, Normocephalic.  Ears and Nose: No external deformity. CV: RRR, No M/G/R. No JVD. No thrill. No extra heart sounds. PULM: CTA B, no wheezes, crackles, rhonchi. No retractions. No resp. distress. No accessory muscle use. ABD: S, NT, ND, +BS. No rebound. No HSM. EXTR: No c/c/e PSYCH: Normally interactive. Conversant.    Assessment and Plan: ***  Signed Abbe Amsterdam, MD

## 2022-12-13 NOTE — Patient Instructions (Incomplete)
It was good to see you today  I would really recommend getting the shingles vaccine series at your pharmacy if you have not done so already  Assuming all is well, please see me in about 6 months  Flu shot given today I will be in touch with your x-rays Take the Keflex twice a day for 1 week for infection across your leg.  Please let me know if this is not continuing to improve

## 2022-12-16 ENCOUNTER — Ambulatory Visit: Payer: Medicare Other | Admitting: Family Medicine

## 2022-12-16 ENCOUNTER — Ambulatory Visit (HOSPITAL_BASED_OUTPATIENT_CLINIC_OR_DEPARTMENT_OTHER)
Admission: RE | Admit: 2022-12-16 | Discharge: 2022-12-16 | Disposition: A | Discharge status: Home / Self Care | Payer: Medicare Other | Source: Ambulatory Visit | Attending: Family Medicine | Admitting: Family Medicine

## 2022-12-16 VITALS — BP 122/70 | HR 72 | Temp 97.7°F | Resp 18 | Ht 66.0 in | Wt 174.6 lb

## 2022-12-16 DIAGNOSIS — I4819 Other persistent atrial fibrillation: Secondary | ICD-10-CM

## 2022-12-16 DIAGNOSIS — L03115 Cellulitis of right lower limb: Secondary | ICD-10-CM

## 2022-12-16 DIAGNOSIS — I429 Cardiomyopathy, unspecified: Secondary | ICD-10-CM

## 2022-12-16 DIAGNOSIS — L0390 Cellulitis, unspecified: Secondary | ICD-10-CM | POA: Diagnosis not present

## 2022-12-16 DIAGNOSIS — M4807 Spinal stenosis, lumbosacral region: Secondary | ICD-10-CM | POA: Diagnosis not present

## 2022-12-16 DIAGNOSIS — Z23 Encounter for immunization: Secondary | ICD-10-CM

## 2022-12-16 MED ORDER — TRAMADOL HCL 50 MG PO TABS
ORAL_TABLET | ORAL | 1 refills | Status: DC
Start: 2022-12-16 — End: 2023-06-30

## 2022-12-16 MED ORDER — CEPHALEXIN 500 MG PO CAPS: 500.0000 mg | ORAL_CAPSULE | Freq: Two times a day (BID) | ORAL | 0 refills | Status: DC

## 2023-01-29 ENCOUNTER — Telehealth: Payer: Self-pay | Admitting: Cardiovascular Disease

## 2023-01-29 NOTE — Telephone Encounter (Signed)
Patient asking for call when samples of Eliquis available

## 2023-02-01 MED ORDER — APIXABAN 2.5 MG PO TABS: 2.5000 mg | ORAL_TABLET | Freq: Two times a day (BID) | ORAL | 0 refills | Status: DC

## 2023-02-01 NOTE — Telephone Encounter (Signed)
 Attempted to call patient, no answer left message requesting a call back.

## 2023-02-01 NOTE — Telephone Encounter (Addendum)
Samples of Eliquis 2.5mg  (two boxes, 28 tablets) placed in front for patient pick up

## 2023-02-01 NOTE — Telephone Encounter (Signed)
Ok to give samples of Eliquis 5 mg

## 2023-02-09 NOTE — Progress Notes (Unsigned)
[facility] at [facility] [address] [place], [place] [address] [phone]  Date:  02/10/2023   Name:  Gregory [name]   DOB:  November 06, 1932   MRN:  [id number]  PCP:  Gregory Cables, MD    Chief Complaint: Shoulder Pain (R shoulder x 1 month after painting a deck. It started to feel better then 5 days ago the pain worsened. He did have a fall but did not fall on the shoulder. )   History of Present Illness:  Gregory [name] is a 89 y.o. very pleasant male patient who presents with the following:  Patient seen today with concern about his right shoulder Most recent visit with myself was in early October at which time he had cellulitis of his right leg-2 weeks prior he was doing a project and a piece of wood fell and hit him across the shin, I treated him with Keflex Tetanus up-to-date  Patient with history of CAD status post CABG 2003, A. fib, cardiomyopathy, CVA 2005, PVD, carotid stenosis, renal infarct 2017 during temporary Xarelto hold, esophageal stricture status post dilation, hypothyroidism, chronic renal insufficiency, prediabetes, chronic anemia, secondary hyperparathyroidism   Uses gabapentin and occasional tramadol for pain He saw cardiology in August-they note permanent A-fib, rate controlled with carvedilol, taking Eliquis at low-dose due to age and renal function.  Most recent EF 42%.  Patient has declined treatment for his carotid disease   Gregory [name] painted a deck about a month ago and developed pain in the right shoulder.  This seemed to be getting better, but then over the last 4-5 days it has started hurting him again.  It is significantly uncomfortable He takes gabapentin 100 mg at night and is using tramadol as needed for various pain, he has tried tramadol for his shoulder but it still hurts He is not aware of any particular injury to his shoulder but did overuse it as above He notes it hurts to raise his arm  forward He has to prop his shoulder carefully while in bed at night   He is right handed  Patient Active Problem List   Diagnosis Date Noted   Fever 04/17/2021   Protein-calorie malnutrition, severe 04/15/2021   Pressure injury of sacral region, stage 1 04/14/2021   Acute on chronic systolic CHF (congestive heart failure) (HCC) 04/13/2021   Chronic kidney disease, stage 3a (HCC) 04/13/2021   Chronic diastolic CHF (congestive heart failure) (HCC) 04/06/2021   Essential hypertension 04/06/2021   Mixed hyperlipidemia 04/06/2021   Elevated liver enzymes 04/06/2021   Anemia, unspecified 04/06/2021   Hypothyroidism 04/06/2021   Restless leg syndrome 04/06/2021   Failure to thrive in adult 04/06/2021   Other cirrhosis of liver (HCC) 04/06/2021   GERD without esophagitis 01/26/2019   Hypothyroid 12/12/2018   Prediabetes 12/04/2018   Chronic anemia 01/13/2016   Chronic renal failure, stage 3 (moderate) (HCC) 08/06/2015   Vasovagal syncope    Renal infarction (HCC) 07/31/2015   Syncope 07/30/2015   Esophageal dysphagia    Carotid stenosis 07/13/2013   Anticoagulated on Eliquis 03/21/2013   PVD - 99% LICA, 79% RICA by doppler 5/14 01/24/2013   Thrombus of left atrial appendage on TEE 01/23/13 01/24/2013   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 03/31/2011   Atrial fibrillation, chronic (HCC) 03/31/2011   Cardiomyopathy, ischemic, improved 03/31/2011  CVA (cerebral vascular accident) (HCC) 03/31/2011   Renal calculi 03/31/2011   History of tobacco use, continues with chewing tobacco 03/31/2011    Past Medical History:  Diagnosis Date   Acute respiratory failure (HCC)    Appendicitis with abscess 03/31/2011   Arthritis    CAD (coronary artery disease),CABG 1993-LIMA to the LAD, SVG to Om and SVG to PDA/PLA, patent on cath 2008. 2003   a. s/p CABG 2003. b. last cath 2008 with patent  grafts.   Chronic atrial fibrillation (HCC)    permanent   Chronic systolic CHF (congestive heart failure) (HCC)    a. Prior low EF, later normalized.   COVID 03/14/2021   Esophageal obstruction due to food impaction    Esophageal stricture    Essential hypertension    Food impaction of esophagus    GERD (gastroesophageal reflux disease)    Heart murmur    Hyperlipidemia    Ischemic cardiomyopathy    Kidney stones    "passed all but one time when he had to have lithotripsy" (01/21/2013)   Renal infarct (HCC)    a. 07/2015 - after holding Xarelto x 3 days for spinal procedure.   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 felt vasovagal in setting of pain from renal infarct.    Past Surgical History:  Procedure Laterality Date   BALLOON DILATION N/A 05/22/2015   Procedure: BALLOON DILATION;  Surgeon: Iva Boop, MD;  Location: [facility] ENDOSCOPY;  Service: Endoscopy;  Laterality: N/A;   BALLOON DILATION N/A 12/15/2018   Procedure: BALLOON DILATION;  Surgeon: Iva Boop, MD;  Location: [facility];  Service: Endoscopy;  Laterality: N/A;   BALLOON DILATION N/A 10/14/2021   Procedure: BALLOON DILATION;  Surgeon: Jenel Lucks, MD;  Location: [facility];  Service: Gastroenterology;  Laterality: N/A;   BALLOON DILATION N/A 10/24/2021   Procedure: BALLOON DILATION;  Surgeon: Iva Boop, MD;  Location: [facility]OS[facility];  Service: Gastroenterology;  Laterality: N/A;   CARDIAC CATHETERIZATION  02/24/2002   reduced LV function, 60-70% prox RCA stenosis, 70% PLA ostial stenosis, 80% secondary branch of PLA stenosis - subsequent CABG (Dr. [name])   Carotid Doppler  06/2012   50-69% right bulb/prox ICA diameter reduction; 70-99% left bulb/prox ICA diameter reduction   CATARACT EXTRACTION W/ INTRAOCULAR LENS IMPLANT Bilateral 2013   COLONOSCOPY N/A  05/18/2016   Procedure: COLONOSCOPY;  Surgeon: Ruffin Frederick, MD;  Location: [facility] ENDOSCOPY;  Service: Gastroenterology;  Laterality: N/A;   CORONARY ANGIOPLASTY  07/05/2006   3 vessel CAD, patent LIMA to LAD, patentVG to OM, patent SVG to PDA & PLA, mild MR, severe LV systolic dysfunction (Dr. Laurell Josephs)   CORONARY ARTERY BYPASS GRAFT  03/01/2002   LIMA to LAD, reverse SVG to OM, reverse SVG to PDA of RCA, reverse SVG to PLA of RCA, ligation of LA appendage (Dr. Tyrone Sage)   ENTEROSCOPY N/A 05/20/2016   Procedure: ENTEROSCOPY;  Surgeon: Ruffin Frederick, MD;  Location: [facility] ENDOSCOPY;  Service: Gastroenterology;  Laterality: N/A;   ESOPHAGOGASTRODUODENOSCOPY  02/01/2012   Procedure: ESOPHAGOGASTRODUODENOSCOPY (EGD);  Surgeon: Theda Belfast, MD;  Location: [facility];  Service: Endoscopy;  Laterality: N/A;   ESOPHAGOGASTRODUODENOSCOPY N/A 05/22/2015   Procedure: ESOPHAGOGASTRODUODENOSCOPY (EGD);  Surgeon: Iva Boop, MD;  Location: [facility];  Service: Endoscopy;  Laterality: N/A;   ESOPHAGOGASTRODUODENOSCOPY  N/A 05/17/2016   Procedure: ESOPHAGOGASTRODUODENOSCOPY (EGD);  Surgeon: Charna Elizabeth, MD;  Location: [facility] ENDOSCOPY;  Service: Endoscopy;  Laterality: N/A;   ESOPHAGOGASTRODUODENOSCOPY N/A 09/17/2017   Procedure: ESOPHAGOGASTRODUODENOSCOPY (EGD);  Surgeon: Rachael Fee, MD;  Location: [facility] ENDOSCOPY;  Service: Endoscopy;  Laterality: N/A;   ESOPHAGOGASTRODUODENOSCOPY Left 02/09/2020   Procedure: ESOPHAGOGASTRODUODENOSCOPY (EGD);  Surgeon: Shellia Cleverly, DO;  Location: [facility] ENDOSCOPY;  Service: Gastroenterology;  Laterality: Left;   ESOPHAGOGASTRODUODENOSCOPY (EGD) WITH PROPOFOL N/A 12/15/2018   Procedure: ESOPHAGOGASTRODUODENOSCOPY (EGD) WITH PROPOFOL;  Surgeon: Iva Boop, MD;  Location: [facility];  Service: Endoscopy;  Laterality: N/A;   ESOPHAGOGASTRODUODENOSCOPY (EGD) WITH PROPOFOL N/A 04/07/2021   Procedure: ESOPHAGOGASTRODUODENOSCOPY (EGD) WITH PROPOFOL;  Surgeon:  Rachael Fee, MD;  Location: [facility];  Service: Endoscopy;  Laterality: N/A;   ESOPHAGOGASTRODUODENOSCOPY (EGD) WITH PROPOFOL N/A 10/14/2021   Procedure: ESOPHAGOGASTRODUODENOSCOPY (EGD) WITH PROPOFOL;  Surgeon: Jenel Lucks, MD;  Location: [facility];  Service: Gastroenterology;  Laterality: N/A;   ESOPHAGOGASTRODUODENOSCOPY (EGD) WITH PROPOFOL N/A 10/24/2021   Procedure: ESOPHAGOGASTRODUODENOSCOPY (EGD) WITH PROPOFOL;  Surgeon: Iva Boop, MD;  Location: [facility] ENDOS[facility];  Service: Gastroenterology;  Laterality: N/A;   FOREIGN BODY REMOVAL  09/17/2017   Procedure: FOREIGN BODY REMOVAL;  Surgeon: Rachael Fee, MD;  Location: [facility] ENDOSCOPY;  Service: Endoscopy;;   FOREIGN BODY REMOVAL  12/15/2018   Procedure: FOREIGN BODY REMOVAL;  Surgeon: Iva Boop, MD;  Location: [facility] END[facility];  Service: Endoscopy;;   FOREIGN BODY REMOVAL  02/09/2020   Procedure: FOREIGN BODY REMOVAL;  Surgeon: Shellia Cleverly, DO;  Location: [facility] ENDOSCOPY;  Service: Gastroenterology;;   FOREIGN BODY REMOVAL  10/14/2021   Procedure: FOREIGN BODY REMOVAL;  Surgeon: Jenel Lucks, MD;  Location: [facility]OSCOPY;  Service: Gastroenterology;;   GIVENS CAPSULE STUDY N/A 05/18/2016   Procedure: GIVENS CAPSULE STUDY;  Surgeon: Ruffin Frederick, MD;  Location: [facility] ENDOSCOPY;  Service: Gastroenterology;  Laterality: N/A;   LAPAROSCOPIC APPENDECTOMY  03/30/2011   Procedure: APPENDECTOMY LAPAROSCOPIC;  Surgeon: Clovis Pu. Cornett, MD;  Location: [facility] OR;  Service: General;  Laterality: N/A;   LEFT HEART CATHETERIZATION WITH CORONARY ANGIOGRAM N/A 01/24/2013   Procedure: LEFT HEART CATHETERIZATION WITH CORONARY ANGIOGRAM;  Surgeon: Runell Gess, MD;  Location: [facility] CATH LAB;  Service: Cardiovascular;  Laterality: N/A;  Right heart with grafts   LITHOTRIPSY     "once" (01/21/2013)   NM MYOCAR PERF WALL MOTION  05/2009   persantine myoview - fixed moderate perfusion defect in inferior wall & lateral segment of apex  (poor non-transmural infarction), minimal anterolateral periinfarct reversible ischemia seen, abnormal study, defects similar to 2006 study   RIGHT/LEFT HEART CATH AND CORONARY ANGIOGRAPHY N/A 09/17/2017   Procedure: RIGHT/LEFT HEART CATH AND CORONARY ANGIOGRAPHY;  Surgeon: Swaziland, Peter M, MD;  Location: [facility] INVASIVE CV LAB;  Service: Cardiovascular;  Laterality: N/A;   SPLENECTOMY, TOTAL  01/2013   TEE WITHOUT CARDIOVERSION N/A 01/23/2013   Procedure: TRANSESOPHAGEAL ECHOCARDIOGRAM (TEE);  Surgeon: Thurmon Fair, MD;  Location: [facility] ENDOSCOPY;  Service: Cardiovascular;  Laterality: N/A;   TRANSTHORACIC ECHOCARDIOGRAM  06/23/2012   EF 40-45%, mild LVH; mild AV regurg; mild MV regurg; LV mod-severely dilated; RV mildly dilated; systolic pressure borderline increased; RA mod dilated    Social History   Tobacco Use   Smoking status: Former    Current packs/day: 0.00    Average packs/day: 2.0 packs/day for 20.0 years (40.0 ttl pk-yrs)    Types: Cigarettes    Start date: 04/08/1966  Quit date: 04/08/1986    Years since quitting: 36.8   Smokeless tobacco: Current    Types: Chew   Tobacco comments:    Encouraged cessation  Vaping Use   Vaping status: Never Used  Substance Use Topics   Alcohol use: No    Alcohol/week: 0.0 standard drinks of alcohol   Drug use: No    Family History  Problem Relation Age of Onset   Heart disease Father        rheumatic fever   Heart attack Brother 25   Stroke Mother    Stroke Brother 16   Stomach cancer Neg Hx    Rectal cancer Neg Hx    Prostate cancer Neg Hx    Pancreatic cancer Neg Hx    Ovarian cancer Neg Hx     Allergies  Allergen Reactions   Percocet [Oxycodone-Acetaminophen] Itching and Nausea Only    Medication list has been reviewed and updated.  Current Outpatient Medications on File Prior to Visit  Medication Sig Dispense Refill   acetaminophen (TYLENOL) 500 MG tablet Take 500 mg by mouth every 6 (six) hours as needed for moderate  pain.     apixaban (ELIQUIS) 2.5 MG TABS tablet TAKE 1 TABLET(2.5 MG) BY MOUTH TWICE DAILY 180 tablet 3   apixaban (ELIQUIS) 2.5 MG TABS tablet Take 1 tablet (2.5 mg total) by mouth 2 (two) times daily. 28 tablet 0   carvedilol (COREG) 3.125 MG tablet TAKE 1 TABLET(3.125 MG) BY MOUTH TWICE DAILY WITH A MEAL 180 tablet 3   ezetimibe (ZETIA) 10 MG tablet Take 1 tablet (10 mg total) by mouth daily. 90 tablet 3   gabapentin (NEURONTIN) 100 MG capsule Take 100 mg by mouth at bedtime as needed (nerve pain.).     levothyroxine (SYNTHROID) 25 MCG tablet TAKE 1 TABLET(25 MCG) BY MOUTH DAILY BEFORE BREAKFAST 90 tablet 3   Melatonin 10 MG TABS Take 10 mg by mouth at bedtime as needed (sleep).     pantoprazole (PROTONIX) 40 MG tablet TAKE 1 TABLET(40 MG) BY MOUTH TWICE DAILY 180 tablet 0   rOPINIRole (REQUIP) 0.25 MG tablet Take 1 tablet (0.25 mg total) by mouth in the morning and at bedtime. 180 tablet 1   rosuvastatin (CRESTOR) 40 MG tablet TAKE 1 TABLET(40 MG) BY MOUTH DAILY 90 tablet 3   tiZANidine (ZANAFLEX) 4 MG tablet TAKE 1 TABLET(4 MG) BY MOUTH EVERY 8 HOURS AS NEEDED FOR MUSCLE SPASMS. DO NOT COMBINE WITH TRAMADOL (Patient taking differently: Take 4 mg by mouth at bedtime as needed for muscle spasms.) 60 tablet 1   traMADol (ULTRAM) 50 MG tablet TAKE 1/2 TABLET BY MOUTH EVERY 8 HOURS AS NEEDED FOR PAIN 90 tablet 1   No current facility-administered medications on file prior to visit.    Review of Systems:  As per HPI- otherwise negative.   Physical Examination: Vitals:   02/10/23 1125  BP: 122/60  Pulse: 72  Resp: 18  Temp: 97.6 F (36.4 C)  SpO2: 99%   Vitals:   02/10/23 1125  Weight: 177 lb 9.6 oz (80.6 kg)  Height: 5\' 6"  (1.676 m)   Body mass index is 28.67 kg/m. Ideal Body Weight: Weight in (lb) to have BMI = 25: 154.6  GEN: no acute distress.  Normal weight, looks his normal self  HEENT: Atraumatic, Normocephalic.  Ears and Nose: No external deformity. CV: RRR, No  M/G/R. No JVD. No thrill. No extra heart sounds. PULM: CTA B, no wheezes, crackles, rhonchi. No retractions.  No resp. distress. No accessory muscle use. EXTR: No c/c/e PSYCH: Normally interactive. Conversant.  Right shoulder: He has significant tenderness with flexion or abduction past about 110 degrees.  Normal strength of biceps, triceps, deltoid.  No deformity, no bruising or wounds noted Pain with empty can testing   Assessment and Plan: Pain and swelling of right shoulder - Plan: HYDROcodone-acetaminophen (NORCO/VICODIN) 5-325 MG tablet, CANCELED: Ambulatory referral to Sports Medicine  Patient seen today with concern of right shoulder pain, suspect this is related to overuse and a strain of the rotator cuff tendon.  We try to get him seen by sports medicine at the med center here today, but they are actually closed.  I called [facility] and they kindly agreed to see him next week on Tuesday.  Family is aware In the meantime I did give him a small supply of hydrocodone to use as needed for more severe pain, cautioned him to use this sparingly and do not combine with muscle relaxer   Kijani did have an adverse reaction to oxycodone several years ago, it caused itching and nausea.  He did not have a true allergic reaction and has tolerated hydrocodone in the interim  Signed Abbe Amsterdam, MD  Called [facility] where he is a pt  12/3 at 10:15 to see Josh Chadwell at [facility]

## 2023-02-09 NOTE — Patient Instructions (Incomplete)
It was good to see you again today!  Please consider getting the dose of RSV vaccine as well as the shingles series  I think you have a tendon strain of your right shoulder- a steroid injection may help In the meantime ok to use tylenol, tramadol, biofreeze  I put in an order for you to be seen at sports med down the hall- please stop by on your way out, I am hoping they may have a spot for you later today or on Friday Let me know if this does not work

## 2023-02-10 ENCOUNTER — Ambulatory Visit: Payer: Medicare Other | Admitting: Family Medicine

## 2023-02-10 VITALS — BP 122/60 | HR 72 | Temp 97.6°F | Resp 18 | Ht 66.0 in | Wt 177.6 lb

## 2023-02-10 DIAGNOSIS — M25411 Effusion, right shoulder: Secondary | ICD-10-CM

## 2023-02-10 DIAGNOSIS — M25511 Pain in right shoulder: Secondary | ICD-10-CM | POA: Diagnosis not present

## 2023-02-10 MED ORDER — HYDROCODONE-ACETAMINOPHEN 5-325 MG PO TABS
1.0000 | ORAL_TABLET | Freq: Three times a day (TID) | ORAL | 0 refills | Status: AC | PRN
Start: 2023-02-10 — End: 2023-02-15

## 2023-02-16 DIAGNOSIS — M25511 Pain in right shoulder: Secondary | ICD-10-CM | POA: Diagnosis not present

## 2023-02-25 ENCOUNTER — Other Ambulatory Visit: Payer: Self-pay | Admitting: Cardiovascular Disease

## 2023-02-25 NOTE — Telephone Encounter (Signed)
Prescription refill request for Eliquis received. Indication: AF Last office visit: 10/21/22  Harrell Lark PA Scr: 1.6 on 11/20/22  Epic Age: 89 Weight: 80.9kg  Based on above findings Eliquis 2.5mg  twice daily is the appropriate dose.  Refill approved.

## 2023-03-15 ENCOUNTER — Other Ambulatory Visit: Payer: Self-pay | Admitting: Cardiovascular Disease

## 2023-03-15 DIAGNOSIS — E785 Hyperlipidemia, unspecified: Secondary | ICD-10-CM

## 2023-04-02 ENCOUNTER — Other Ambulatory Visit: Payer: Self-pay | Admitting: Cardiovascular Disease

## 2023-04-09 ENCOUNTER — Other Ambulatory Visit: Payer: Self-pay | Admitting: Cardiovascular Disease

## 2023-04-09 DIAGNOSIS — E039 Hypothyroidism, unspecified: Secondary | ICD-10-CM

## 2023-04-09 NOTE — Telephone Encounter (Signed)
Pt's pharmacy is requesting a refill on levothyroxine. Would Dr. Tresa Endo like to refill this non cardiac medication? Please address

## 2023-04-12 ENCOUNTER — Telehealth: Payer: Self-pay

## 2023-04-12 NOTE — Telephone Encounter (Signed)
Last TSH was 11/2021 -- ordered by PCP. Not sure who is following labs.   Will defer to covering MA for review.

## 2023-04-12 NOTE — Telephone Encounter (Signed)
Pt's pharmacy is requesting a refill on levothyroxine would Dr. Tresa Endo like to refill this non cardiac medication? Please address

## 2023-04-13 NOTE — Telephone Encounter (Signed)
Non-cardiac medication.

## 2023-04-13 NOTE — Telephone Encounter (Signed)
Due to this medication being non cardiac, patient needs to refill with PCP

## 2023-04-14 NOTE — Telephone Encounter (Signed)
Pt 's daughter said that Dr Tresa Endo has always filled med and is monitoring it please review and notify pt

## 2023-04-15 ENCOUNTER — Other Ambulatory Visit: Payer: Self-pay | Admitting: Family Medicine

## 2023-04-15 DIAGNOSIS — E039 Hypothyroidism, unspecified: Secondary | ICD-10-CM

## 2023-04-15 MED ORDER — LEVOTHYROXINE SODIUM 25 MCG PO TABS: ORAL_TABLET | ORAL | 0 refills | Status: DC

## 2023-04-15 NOTE — Telephone Encounter (Signed)
Copied from CRM [id number]. Topic: Clinical - Medication Refill >> Apr 15, 2023  8:13 AM Almira Coaster wrote: Most Recent Primary Care Visit:  Provider: Pearline Cables  Department: [facility]  Visit Type: OFFICE VISIT  Date: 02/10/2023  Medication: levothyroxine (SYNTHROID) 25 MCG tablet  Has the patient contacted their pharmacy? Yes, they reached out to the cardiologist who stated request it from the Primary care provider. (Agent: If no, request that the patient contact the pharmacy for the refill. If patient does not wish to contact the pharmacy document the reason why and proceed with request.) (Agent: If yes, when and what did the pharmacy advise?)  Is this the correct pharmacy for this prescription? Yes If no, delete pharmacy and type the correct one.  This is the patient's preferred pharmacy:  [id number] [place], [place] - [address] AT [facility] OF [facility] [address] [place] [address] Phone: [phone] Fax: [phone]   Has the prescription been filled recently? No  Is the patient out of the medication? No  Has the patient been seen for an appointment in the last year OR does the patient have an upcoming appointment? Yes  Can we respond through MyChart? No  Agent: Please be advised that Rx refills may take up to 3 business days. We ask that you follow-up with your pharmacy.

## 2023-04-16 NOTE — Telephone Encounter (Signed)
This medication has not been ordered since 2023. This patient has not has tsh testing since 2023. Once TK returns, I will discuss for approval.

## 2023-04-26 NOTE — Telephone Encounter (Signed)
 Rx has already been sent to pharmacy. Pper Loetta Ringer he is ok with filling for courtesy but pt will have to continue with pcp for future filling. Labs were also reviewed as well.

## 2023-04-28 ENCOUNTER — Telehealth: Payer: Self-pay | Admitting: Cardiovascular Disease

## 2023-04-28 NOTE — Telephone Encounter (Signed)
Patient's daughter is returning phone call to CMA Anitra. Please advise.

## 2023-04-28 NOTE — Telephone Encounter (Signed)
   Patient identification verified by 2 forms. Gregory Rail, RN    Called and spoke to patients daughter Kathe Mariner states:   -Rx for levothyroxine picked up  [name] to ask PCP for future refills  Patient verbalized understanding, no questions at this time

## 2023-05-04 ENCOUNTER — Other Ambulatory Visit: Payer: Self-pay | Admitting: Family Medicine

## 2023-05-04 DIAGNOSIS — G2581 Restless legs syndrome: Secondary | ICD-10-CM

## 2023-05-12 ENCOUNTER — Other Ambulatory Visit: Payer: Self-pay | Admitting: Family Medicine

## 2023-05-12 DIAGNOSIS — E039 Hypothyroidism, unspecified: Secondary | ICD-10-CM

## 2023-06-02 DIAGNOSIS — I129 Hypertensive chronic kidney disease with stage 1 through stage 4 chronic kidney disease, or unspecified chronic kidney disease: Secondary | ICD-10-CM | POA: Diagnosis not present

## 2023-06-02 DIAGNOSIS — N183 Chronic kidney disease, stage 3 unspecified: Secondary | ICD-10-CM | POA: Diagnosis not present

## 2023-06-02 DIAGNOSIS — N189 Chronic kidney disease, unspecified: Secondary | ICD-10-CM | POA: Diagnosis not present

## 2023-06-02 DIAGNOSIS — D631 Anemia in chronic kidney disease: Secondary | ICD-10-CM | POA: Diagnosis not present

## 2023-06-02 DIAGNOSIS — N2581 Secondary hyperparathyroidism of renal origin: Secondary | ICD-10-CM | POA: Diagnosis not present

## 2023-06-02 LAB — COMPREHENSIVE METABOLIC PANEL WITH GFR
Albumin: 3.8 (ref 3.5–5.0)
Calcium: 9.2 (ref 8.7–10.7)

## 2023-06-02 LAB — BASIC METABOLIC PANEL WITH GFR
BUN: 20 (ref 4–21)
CO2: 27 — AB (ref 13–22)
Chloride: 107 (ref 99–108)
Creatinine: 1.6 — AB (ref 0.6–1.3)
Glucose: 167
Potassium: 3.7 meq/L (ref 3.5–5.1)
Sodium: 140 (ref 137–147)

## 2023-06-02 LAB — HEMOGLOBIN A1C: Hemoglobin A1C: 11.1

## 2023-06-04 ENCOUNTER — Other Ambulatory Visit: Payer: Self-pay | Admitting: Physician Assistant

## 2023-06-10 LAB — LAB REPORT - SCANNED: PTH, Intact: 58

## 2023-06-11 ENCOUNTER — Other Ambulatory Visit: Payer: Self-pay | Admitting: Family Medicine

## 2023-06-11 DIAGNOSIS — E039 Hypothyroidism, unspecified: Secondary | ICD-10-CM

## 2023-06-16 ENCOUNTER — Ambulatory Visit: Payer: Medicare Other | Admitting: Family Medicine

## 2023-06-19 NOTE — Progress Notes (Unsigned)
 [facility] [address] [place], [place] [address] [phone]  Date:  06/23/2023   Name:  Gregory [name]   DOB:  02-17-1933   MRN:  [id number]  PCP:  Pearline Cables, MD    Chief Complaint: No chief complaint on file.   History of Present Illness:  Gregory [name] is a 89 y.o. very pleasant male patient who presents with the following:  Patient seen today for periodic follow-up Most recent visit with myself was in November Patient with history of CAD status post CABG 2003, A. 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  Most recent EF 42%.  Patient has declined treatment for his carotid disease   Since our most recent visit he followed up with Dr. Madelon Lips regarding his shoulder pain  He also saw his nephrologist in March of this year-they sent [place] their note and some lab work Patient Active Problem List   Diagnosis Date Noted   Fever 04/17/2021   Protein-calorie malnutrition, severe 04/15/2021   Pressure injury of sacral region, stage 1 04/14/2021   Acute on chronic systolic CHF (congestive heart failure) (HCC) 04/13/2021   Chronic kidney disease, stage 3a (HCC) 04/13/2021   Chronic diastolic CHF (congestive heart failure) (HCC) 04/06/2021   Essential hypertension 04/06/2021   Mixed hyperlipidemia 04/06/2021   Elevated liver enzymes 04/06/2021   Anemia, unspecified 04/06/2021   Hypothyroidism 04/06/2021   Restless leg syndrome 04/06/2021   Failure to thrive in adult 04/06/2021   Other cirrhosis of liver (HCC)  04/06/2021   GERD without esophagitis 01/26/2019   Hypothyroid 12/12/2018   Prediabetes 12/04/2018   Chronic anemia 01/13/2016   Chronic renal failure, stage 3 (moderate) (HCC) 08/06/2015   Vasovagal syncope    Renal infarction (HCC) 07/31/2015   Syncope 07/30/2015   Esophageal dysphagia    Carotid stenosis 07/13/2013   Anticoagulated on Eliquis 03/21/2013   PVD - 99% LICA, 79% RICA by doppler 5/14 01/24/2013   Thrombus of left atrial appendage on TEE 01/23/13 01/24/2013   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 03/31/2011   Atrial fibrillation, chronic (HCC) 03/31/2011   Cardiomyopathy, ischemic, improved 03/31/2011   CVA (cerebral vascular accident) (HCC) 03/31/2011   Renal calculi 03/31/2011   History of tobacco use, continues with chewing tobacco 03/31/2011    Past Medical History:  Diagnosis Date   Acute respiratory failure (HCC)    Appendicitis with abscess 03/31/2011   Arthritis    CAD (coronary artery disease),CABG 1993-LIMA to the LAD, SVG to Om and SVG to PDA/PLA, patent on cath 2008. 2003   a. s/p CABG 2003. b. last cath 2008 with patent grafts.   Chronic atrial fibrillation (HCC)    permanent   Chronic systolic CHF (congestive heart failure) (HCC)    a. Prior low EF, later normalized.   COVID 03/14/2021   Esophageal obstruction due to food impaction    Esophageal stricture    Essential hypertension    Food impaction of esophagus    GERD (gastroesophageal reflux disease)  Heart murmur    Hyperlipidemia    Ischemic cardiomyopathy    Kidney stones    "passed all but one time when he had to have lithotripsy" (01/21/2013)   Renal infarct (HCC)    a. 07/2015 - after holding Xarelto x 3 days for spinal procedure.   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 felt vasovagal in setting of pain from renal infarct.    Past Surgical History:  Procedure Laterality Date   BALLOON DILATION N/A 05/22/2015   Procedure: BALLOON DILATION;  Surgeon: Iva Boop, MD;  Location: [facility] ENDOSCOPY;  Service: Endoscopy;  Laterality: N/A;   BALLOON DILATION N/A 12/15/2018   Procedure: BALLOON DILATION;  Surgeon: Iva Boop, MD;  Location: [facility] ENDOSCOPY;  Service: Endoscopy;  Laterality: N/A;   BALLOON DILATION N/A 10/14/2021   Procedure: BALLOON DILATION;  Surgeon: Jenel Lucks, MD;  Location: [facility] ENDOSCOPY;  Service: Gastroenterology;  Laterality: N/A;   BALLOON DILATION N/A 10/24/2021   Procedure: BALLOON DILATION;  Surgeon: Iva Boop, MD;  Location: [facility] ENDOSCOPY;  Service: Gastroenterology;  Laterality: N/A;   CARDIAC CATHETERIZATION  02/24/2002   reduced LV function, 60-70% prox RCA stenosis, 70% PLA ostial stenosis, 80% secondary branch of PLA stenosis - subsequent CABG (Dr. [name])   Carotid Doppler  06/2012   50-69% right bulb/prox ICA diameter reduction; 70-99% left bulb/prox ICA diameter reduction   CATARACT EXTRACTION W/ INTRAOCULAR LENS IMPLANT Bilateral 2013   COLONOSCOPY N/A 05/18/2016   Procedure: COLONOSCOPY;  Surgeon: Ruffin Frederick, MD;  Location: [facility] ENDOSCOPY;  Service: Gastroenterology;  Laterality: N/A;   CORONARY ANGIOPLASTY  07/05/2006   3 vessel CAD, patent LIMA to LAD, patentVG to OM, patent SVG to PDA & PLA, mild MR, severe LV systolic dysfunction (Dr. Laurell Josephs)   CORONARY ARTERY BYPASS GRAFT  03/01/2002   LIMA to LAD, reverse SVG to OM, reverse SVG to PDA of RCA, reverse SVG to PLA of RCA, ligation of LA appendage (Dr. Tyrone Sage)   ENTEROSCOPY N/A 05/20/2016   Procedure: ENTEROSCOPY;  Surgeon: Ruffin Frederick, MD;  Location: [facility] ENDOSCOPY;  Service: Gastroenterology;  Laterality: N/A;   ESOPHAGOGASTRODUODENOSCOPY  02/01/2012   Procedure: ESOPHAGOGASTRODUODENOSCOPY (EGD);  Surgeon: Theda Belfast, MD;  Location: [facility] ENDOSCOPY;  Service: Endoscopy;  Laterality: N/A;   ESOPHAGOGASTRODUODENOSCOPY N/A 05/22/2015   Procedure: ESOPHAGOGASTRODUODENOSCOPY (EGD);  Surgeon: Iva Boop, MD;  Location: [facility] ENDOSCOPY;  Service: Endoscopy;  Laterality: N/A;   ESOPHAGOGASTRODUODENOSCOPY N/A 05/17/2016   Procedure: ESOPHAGOGASTRODUODENOSCOPY (EGD);  Surgeon: Charna Elizabeth, MD;  Location: [facility] ENDOSCOPY;  Service: Endoscopy;  Laterality: N/A;   ESOPHAGOGASTRODUODENOSCOPY N/A 09/17/2017   Procedure: ESOPHAGOGASTRODUODENOSCOPY (EGD);  Surgeon: Rachael Fee, MD;  Location: [facility] ENDOSCOPY;  Service: Endoscopy;  Laterality: N/A;   ESOPHAGOGASTRODUODENOSCOPY Left 02/09/2020   Procedure: ESOPHAGOGASTRODUODENOSCOPY (EGD);  Surgeon: Shellia Cleverly, DO;  Location: [facility] ENDOSCOPY;  Service: Gastroenterology;  Laterality: Left;   ESOPHAGOGASTRODUODENOSCOPY (EGD) WITH PROPOFOL N/A 12/15/2018   Procedure: ESOPHAGOGASTRODUODENOSCOPY (EGD) WITH PROPOFOL;  Surgeon: Iva Boop, MD;  Location: [facility] ENDOSCOPY;  Service: Endoscopy;  Laterality: N/A;   ESOPHAGOGASTRODUODENOSCOPY (EGD) WITH PROPOFOL N/A 04/07/2021   Procedure: ESOPHAGOGASTRODUODENOSCOPY (EGD) WITH PROPOFOL;  Surgeon: Rachael Fee, MD;  Location: [facility] ENDOSCOPY;  Service: Endoscopy;  Laterality: N/A;   ESOPHAGOGASTRODUODENOSCOPY (EGD) WITH PROPOFOL N/A 10/14/2021   Procedure: ESOPHAGOGASTRODUODENOSCOPY (EGD) WITH  PROPOFOL;  Surgeon: Jenel Lucks, MD;  Location: [facility] ENDOSCOPY;  Service: Gastroenterology;  Laterality: N/A;   ESOPHAGOGASTRODUODENOSCOPY (EGD) WITH PROPOFOL N/A 10/24/2021   Procedure: ESOPHAGOGASTRODUODENOSCOPY (EGD) WITH PROPOFOL;  Surgeon: Iva Boop, MD;  Location: [facility] ENDOSCOPY;  Service: Gastroenterology;  Laterality: N/A;   FOREIGN BODY REMOVAL  09/17/2017   Procedure: FOREIGN BODY REMOVAL;  Surgeon: Rachael Fee, MD;  Location: [facility] ENDOSCOPY;  Service: Endoscopy;;   FOREIGN BODY REMOVAL  12/15/2018   Procedure: FOREIGN BODY REMOVAL;   Surgeon: Iva Boop, MD;  Location: [facility] ENDOSCOPY;  Service: Endoscopy;;   FOREIGN BODY REMOVAL  02/09/2020   Procedure: FOREIGN BODY REMOVAL;  Surgeon: Shellia Cleverly, DO;  Location: [facility] ENDOSCOPY;  Service: Gastroenterology;;   FOREIGN BODY REMOVAL  10/14/2021   Procedure: FOREIGN BODY REMOVAL;  Surgeon: Jenel Lucks, MD;  Location: [facility] ENDOSCOPY;  Service: Gastroenterology;;   GIVENS CAPSULE STUDY N/A 05/18/2016   Procedure: GIVENS CAPSULE STUDY;  Surgeon: Ruffin Frederick, MD;  Location: [facility] ENDOSCOPY;  Service: Gastroenterology;  Laterality: N/A;   LAPAROSCOPIC APPENDECTOMY  03/30/2011   Procedure: APPENDECTOMY LAPAROSCOPIC;  Surgeon: Clovis Pu. Cornett, MD;  Location: [facility] OR;  Service: General;  Laterality: N/A;   LEFT HEART CATHETERIZATION WITH CORONARY ANGIOGRAM N/A 01/24/2013   Procedure: LEFT HEART CATHETERIZATION WITH CORONARY ANGIOGRAM;  Surgeon: Runell Gess, MD;  Location: [facility] CATH LAB;  Service: Cardiovascular;  Laterality: N/A;  Right heart with grafts   LITHOTRIPSY     "once" (01/21/2013)   NM MYOCAR PERF WALL MOTION  05/2009   persantine myoview - fixed moderate perfusion defect in inferior wall & lateral segment of apex (poor non-transmural infarction), minimal anterolateral periinfarct reversible ischemia seen, abnormal study, defects similar to 2006 study   RIGHT/LEFT HEART CATH AND CORONARY ANGIOGRAPHY N/A 09/17/2017   Procedure: RIGHT/LEFT HEART CATH AND CORONARY ANGIOGRAPHY;  Surgeon: [place], Peter M, MD;  Location: [facility] INVASIVE CV LAB;  Service: Cardiovascular;  Laterality: N/A;   SPLENECTOMY, TOTAL  01/2013   TEE WITHOUT CARDIOVERSION N/A 01/23/2013   Procedure: TRANSESOPHAGEAL ECHOCARDIOGRAM (TEE);  Surgeon: Thurmon Fair, MD;  Location: [facility] ENDOSCOPY;  Service: Cardiovascular;  Laterality: N/A;   TRANSTHORACIC ECHOCARDIOGRAM  06/23/2012   EF 40-45%, mild LVH; mild AV regurg; mild MV regurg; LV mod-severely dilated; RV mildly dilated; systolic pressure  borderline increased; RA mod dilated    Social History   Tobacco Use   Smoking status: Former    Current packs/day: 0.00    Average packs/day: 2.0 packs/day for 20.0 years (40.0 ttl pk-yrs)    Types: Cigarettes    Start date: 04/08/1966    Quit date: 04/08/1986    Years since quitting: 37.2   Smokeless tobacco: Current    Types: Chew   Tobacco comments:    Encouraged cessation  Vaping Use   Vaping status: Never Used  Substance Use Topics   Alcohol use: No    Alcohol/week: 0.0 standard drinks of alcohol   Drug use: No    Family History  Problem Relation Age of Onset   Heart disease Father        rheumatic fever   Heart attack Brother 50   Stroke Mother    Stroke Brother 63   Stomach cancer Neg Hx    Rectal cancer Neg Hx    Prostate cancer Neg Hx    Pancreatic cancer Neg Hx    Ovarian cancer Neg Hx     Allergies  Allergen Reactions   Percocet [Oxycodone-Acetaminophen] Itching  and Nausea Only    Medication list has been reviewed and updated.  Current Outpatient Medications on File Prior to Visit  Medication Sig Dispense Refill   levothyroxine (SYNTHROID) 25 MCG tablet Take 1 tablet (25 mcg total) by mouth daily before breakfast. 90 tablet 0   acetaminophen (TYLENOL) 500 MG tablet Take 500 mg by mouth every 6 (six) hours as needed for moderate pain.     apixaban (ELIQUIS) 2.5 MG TABS tablet TAKE 1 TABLET(2.5 MG) BY MOUTH TWICE DAILY 180 tablet 3   apixaban (ELIQUIS) 2.5 MG TABS tablet Take 1 tablet (2.5 mg total) by mouth 2 (two) times daily. 28 tablet 0   apixaban (ELIQUIS) 2.5 MG TABS tablet TAKE 1 TABLET BY MOUTH TWICE DAILY 180 tablet 1   carvedilol (COREG) 3.125 MG tablet TAKE 1 TABLET(3.125 MG) BY MOUTH TWICE DAILY WITH A MEAL 180 tablet 3   ezetimibe (ZETIA) 10 MG tablet TAKE 1 TABLET(10 MG) BY MOUTH DAILY 90 tablet 2   gabapentin (NEURONTIN) 100 MG capsule Take 100 mg by mouth at bedtime as needed (nerve pain.).     Melatonin 10 MG TABS Take 10 mg by mouth at  bedtime as needed (sleep).     pantoprazole (PROTONIX) 40 MG tablet TAKE 1 TABLET(40 MG) BY MOUTH TWICE DAILY 180 tablet 1   rOPINIRole (REQUIP) 0.25 MG tablet TAKE 1 TABLET(0.25 MG) BY MOUTH IN THE MORNING AND AT BEDTIME 180 tablet 0   rosuvastatin (CRESTOR) 40 MG tablet TAKE 1 TABLET(40 MG) BY MOUTH DAILY 90 tablet 2   tiZANidine (ZANAFLEX) 4 MG tablet TAKE 1 TABLET(4 MG) BY MOUTH EVERY 8 HOURS AS NEEDED FOR MUSCLE SPASMS. DO NOT COMBINE WITH TRAMADOL (Patient taking differently: Take 4 mg by mouth at bedtime as needed for muscle spasms.) 60 tablet 1   traMADol (ULTRAM) 50 MG tablet TAKE 1/2 TABLET BY MOUTH EVERY 8 HOURS AS NEEDED FOR PAIN 90 tablet 1   No current facility-administered medications on file prior to visit.    Review of Systems:  As per HPI- otherwise negative.   Physical Examination: There were no vitals filed for this visit. There were no vitals filed for this visit. There is no height or weight on file to calculate BMI. Ideal Body Weight:    GEN: no acute distress. HEENT: Atraumatic, Normocephalic.  Ears and Nose: No external deformity. CV: RRR, No M/G/R. No JVD. No thrill. No extra heart sounds. PULM: CTA B, no wheezes, crackles, rhonchi. No retractions. No resp. distress. No accessory muscle use. ABD: S, NT, ND, +BS. No rebound. No HSM. EXTR: No c/c/e PSYCH: Normally interactive. Conversant.    Assessment and Plan: ***  Signed Abbe Amsterdam, MD

## 2023-06-19 NOTE — Patient Instructions (Incomplete)
 It was great to see you again today Recommended an RSV and shingles vaccine series at your pharmacy I will be in touch with your labs- we will try and figure out why you are feeling so tired!  If anything is changing or getting worse in the meantime please let me know

## 2023-06-23 ENCOUNTER — Ambulatory Visit (INDEPENDENT_AMBULATORY_CARE_PROVIDER_SITE_OTHER): Admitting: Family Medicine

## 2023-06-23 ENCOUNTER — Encounter: Payer: Self-pay | Admitting: Family Medicine

## 2023-06-23 VITALS — BP 120/60 | HR 62 | Temp 97.7°F | Resp 18 | Wt 180.6 lb

## 2023-06-23 DIAGNOSIS — R5383 Other fatigue: Secondary | ICD-10-CM

## 2023-06-23 DIAGNOSIS — Z1329 Encounter for screening for other suspected endocrine disorder: Secondary | ICD-10-CM | POA: Diagnosis not present

## 2023-06-24 LAB — COMPREHENSIVE METABOLIC PANEL WITH GFR
ALT: 20 U/L (ref 0–53)
AST: 32 U/L (ref 0–37)
Albumin: 3.6 g/dL (ref 3.5–5.2)
Alkaline Phosphatase: 82 U/L (ref 39–117)
BUN: 18 mg/dL (ref 6–23)
CO2: 30 meq/L (ref 19–32)
Calcium: 8.8 mg/dL (ref 8.4–10.5)
Chloride: 104 meq/L (ref 96–112)
Creatinine, Ser: 1.4 mg/dL (ref 0.40–1.50)
GFR: 44.66 mL/min — ABNORMAL LOW (ref >60.00)
Glucose, Bld: 94 mg/dL (ref 70–99)
Potassium: 4.8 meq/L (ref 3.5–5.1)
Sodium: 140 meq/L (ref 135–145)
Total Bilirubin: 0.5 mg/dL (ref 0.2–1.2)
Total Protein: 6.3 g/dL (ref 6.0–8.3)

## 2023-06-24 LAB — CBC
HCT: 35.4 % — ABNORMAL LOW (ref 39.0–52.0)
Hemoglobin: 12 g/dL — ABNORMAL LOW (ref 13.0–17.0)
MCHC: 33.9 g/dL (ref 30.0–36.0)
MCV: 98.4 fl (ref 78.0–100.0)
Platelets: 166 10*3/uL (ref 150.0–400.0)
RBC: 3.6 Mil/uL — ABNORMAL LOW (ref 4.22–5.81)
RDW: 14.4 % (ref 11.5–15.5)
WBC: 6.1 10*3/uL (ref 4.0–10.5)

## 2023-06-24 LAB — URINE CULTURE
MICRO NUMBER:: 16308403
Result:: NO GROWTH
SPECIMEN QUALITY:: ADEQUATE

## 2023-06-24 LAB — VITAMIN D 25 HYDROXY (VIT D DEFICIENCY, FRACTURES): VITD: 44.5 ng/mL (ref 30.00–100.00)

## 2023-06-24 LAB — VITAMIN B12: Vitamin B-12: 327 pg/mL (ref 211–911)

## 2023-06-24 LAB — TSH: TSH: 4.46 u[IU]/mL (ref 0.35–5.50)

## 2023-06-24 LAB — FERRITIN: Ferritin: 68 ng/mL (ref 22.0–322.0)

## 2023-06-30 ENCOUNTER — Other Ambulatory Visit: Payer: Self-pay | Admitting: Family Medicine

## 2023-06-30 DIAGNOSIS — M4807 Spinal stenosis, lumbosacral region: Secondary | ICD-10-CM

## 2023-07-07 ENCOUNTER — Ambulatory Visit: Payer: Medicare Other | Attending: Cardiovascular Disease | Admitting: Cardiovascular Disease

## 2023-07-07 ENCOUNTER — Encounter: Payer: Self-pay | Admitting: Cardiovascular Disease

## 2023-07-07 DIAGNOSIS — E785 Hyperlipidemia, unspecified: Secondary | ICD-10-CM | POA: Diagnosis not present

## 2023-07-07 DIAGNOSIS — I2510 Atherosclerotic heart disease of native coronary artery without angina pectoris: Secondary | ICD-10-CM | POA: Diagnosis not present

## 2023-07-07 DIAGNOSIS — I6523 Occlusion and stenosis of bilateral carotid arteries: Secondary | ICD-10-CM | POA: Diagnosis not present

## 2023-07-07 DIAGNOSIS — Z7901 Long term (current) use of anticoagulants: Secondary | ICD-10-CM | POA: Diagnosis not present

## 2023-07-07 DIAGNOSIS — I255 Ischemic cardiomyopathy: Secondary | ICD-10-CM | POA: Diagnosis not present

## 2023-07-07 DIAGNOSIS — Z951 Presence of aortocoronary bypass graft: Secondary | ICD-10-CM

## 2023-07-07 DIAGNOSIS — I10 Essential (primary) hypertension: Secondary | ICD-10-CM

## 2023-07-07 DIAGNOSIS — I4821 Permanent atrial fibrillation: Secondary | ICD-10-CM | POA: Diagnosis not present

## 2023-07-07 MED ORDER — CARVEDILOL 3.125 MG PO TABS: ORAL_TABLET | ORAL | 3 refills | Status: DC

## 2023-07-07 MED ORDER — ROSUVASTATIN CALCIUM 40 MG PO TABS: ORAL_TABLET | ORAL | 3 refills | Status: DC

## 2023-07-07 MED ORDER — PANTOPRAZOLE SODIUM 40 MG PO TBEC: DELAYED_RELEASE_TABLET | ORAL | 2 refills | Status: AC

## 2023-07-07 MED ORDER — EZETIMIBE 10 MG PO TABS: 10.0000 mg | ORAL_TABLET | Freq: Every day | ORAL | 3 refills | Status: DC

## 2023-07-07 NOTE — Patient Instructions (Signed)
 Medication Instructions:  NO CHANGES *If you need a refill on your cardiac medications before your next appointment, please call your pharmacy*  Lab Work: NO LABS If you have labs (blood work) drawn today and your tests are completely normal, you will receive your results only by: MyChart Message (if you have MyChart) OR A paper copy in the mail If you have any lab test that is abnormal or we need to change your treatment, we will call you to review the results.  Testing/Procedures: NO TESTING  Follow-Up: At [facility], you and your health needs are our priority.  As part of our continuing mission to provide you with exceptional heart care, our providers are all part of one team.  This team includes your primary Cardiologist (physician) and Advanced Practice Providers or APPs (Physician Assistants and Nurse Practitioners) who all work together to provide you with the care you need, when you need it.  Your next appointment:   8-9 month(s)  Provider:   Carson Clara, MD   We recommend signing up for the patient portal called "MyChart".  Sign up information is provided on this After Visit Summary.  MyChart is used to connect with patients for Virtual Visits (Telemedicine).  Patients are able to view lab/test results, encounter notes, upcoming appointments, etc.  Non-urgent messages can be sent to your provider as well.   To learn more about what you can do with MyChart, go to [email or web address].   Other Instructions   1st Floor: - Lobby - Registration  - Pharmacy  - Lab - Cafe  2nd Floor: - PV Lab - Diagnostic Testing (echo, CT, nuclear med)  3rd Floor: - Vacant  4th Floor: - TCTS (cardiothoracic surgery) - AFib Clinic - Structural Heart Clinic - Vascular Surgery  - Vascular Ultrasound  5th Floor: - HeartCare Cardiology (general and EP) - Clinical Pharmacy for coumadin, hypertension, lipid, weight-loss medications, and med management  appointments    Valet parking services will be available as well.

## 2023-07-09 ENCOUNTER — Encounter: Payer: Self-pay | Admitting: Cardiovascular Disease

## 2023-07-09 NOTE — Progress Notes (Signed)
 Patient ID: Gregory [name], male   DOB: 1933/03/16, 89 y.o.   MRN: [id number]      HPI: Gregory [name] is a 89 y.o. male who presents for a 17 month follow-up cardiology evaluation.  Gregory [name] has a history of known CAD in 2003 underwent CABG revascularization surgery by Dr. Nicanor Barge  (LIMA  to  LAD, vein graft to the obtuse marginal, sequential vein graft to the PDA and PLA of RCA).  His last cardiac catheterization was in 2008 done by Dr. Silvestre Drum. At that time, ejection fraction was 20-25%. He declined consideration for ICD. Ejection fraction improved to approximately 35-40% in September 2012 on medical therapy. In the past, he has refused Coumadin therapy and had been maintained on aspirin  and Plavix . In 2013 he was hospitalized for appendicitis with abscess requiring laparoscopic appendectomy. He was hospitalized in November 2014 and was felt to have a splenic infarct of embolic etiology. He underwent a transesophageal echocardiogram on 01/23/2013 by Dr. Alvis Ba which revealed an ejection fraction in the range of 15-25%. There was diffuse hypokinesis with severe hypokinesis to akinesis of the anteroseptal apical myocardium. He had increased left ventricular end-diastolic filling pressure and elevated left atrial filling pressure. There was evidence for large solid fixed thrombus occupying the entire atrial appendage with moderate continuous spontaneous echo contrast ("smoke") in the left atrial cavity. There was no evidence for right atrial thrombus. At that time, the patient refused life vest.  He has been on Xarelto  for anticoagulation.He denies chest pain. He denies significant shortness of breath. He is unaware of tachdysrhythmias.   He has permanent atrial fibrillation, but his rate has been controlled.  He denies any presyncope or syncope.  He denies any chest pain.  A followup echo Doppler study on 06/14/2013 revealed significant improvement in LV function with an ejection fraction estimated  now at 50-55% without regional wall motion abnormalities.  He did have mild aortic root dilatation, mild aortic insufficiency, and mild mitral regurgitation with biatrial enlargement.   He has severe bilateral carotid artery disease.  He had been seen by Dr. Nolene Baumgarten.  His prior carotid duplex scan was significantly abnormal with his left carotid velocities increased to 525 systolic and 191 diastolic with severe fibrous plaque and elevated velocities within all segments of his left internal carotid.  There also was 50-69% diameter reduction in the right bulb/proximal ICA. I referred him to Dr. Katheryne Pane who subsequently referred him to Dr. Charlotte Cookey.  He denies chest pain.  He denies palpitations.  He denies shortness of breath.  He continues to say he is asymptomatic with reference to his carotid disease and has refused intervention.  A follow-up Doppler study on 08/23/2014 showed increased velocities in the left ar  492 over 148 at the MICA level and to 30/67, at the PICA level.  On the right, his PICA velocity was 292/78.  When compared to his prior study.  This has not increased and has remained fairly stable.  He denies chest pain or palpitations.  He denies paresthesias.  He denies visual symptoms.  When I saw him in follow-up,  he remained asymptomatic.  He has refused to consider any potential carotid intervention or surgery.  He was unaware of any fast heartbeats with his chronic atrial fibrillation.  He denies bleeding.  A carotid study earlier today continued to demonstrate severe stenosis with peak  velocity in the left MICA at 500/122 and PICA 241/34. On the right,PICA was 293/106.  At his last office visit,  I recommended discontinuing of simvastatin  in attempt to be more aggressive with lipid lowering and started Crestor  40 mg daily.  He was hospitalized May 2017  after sustaining a renal infarct with acute kidney injury after taking 3 days off Xarelto  for pain management injection to his back.  He had  developed mild anemia with a hemoglobin drop of 3 points leading to a subsequent emergency room evaluation.  A CT of his abdomen and pelvis did not show acute findings or signs of peritoneal bleeding.  He denied any episodes of chest pain,  paresthesias, presyncope or syncope. Or bleeding  An echo Doppler study in May 2017 showed an EF of 55-60%.  There was mild aortic insufficiency, mild aortic root dilatation, mitral annular calcification with mild MR.   On a subsequent office visit he began to consider the possibility of maybe in the future thinking about carotid surgery, but he was not ready yet to have this done.  His last carotid study in September 2017 showed left carotid peak systolic velocity of 434 and peak diastolic velocity 114, consistent with a least 80% stenosis.  On the right peak systolic velocity was 215 and diastolic velocity was 85.  He is followed by Dr. Lydia Sams for his chronic kidney disease.  When I saw him in July 2018 he was feeling well and denied any episodes of chest pain or shortness of breath.  He denied any neurologic symptoms.  He denied paresthesias or dizziness.  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 At that time he was still against consideration for carotid surgery.  He was on aggressive lipid-lowering therapy with target LDL less than 70 and potential induce plaque regression.  He was seen by Ervin Heath, PAC in June 2019.  He was on Eliquis  without bleeding he was hospitalized from July 2 through July 6 with acute decompensated heart failure complicated by acute hypoxic respiratory failure.  An echo Doppler study showed an EF of 25 to 30% with diffuse hypokinesis which had significantly decreased from 50 to 60% in 2017.  He underwent repeat catheterization which showed occlusion of his native vessels with a patent LIMA to the LAD, a patent vein graft was on 2, and subchondral vein graft to the PDA/PLA.  He was seen by Gates Kasal in follow-up on September 29, 2017.  At that time he was feeling well and denied significant shortness of breath and was on lisinopril  5 mg, furosemide  40 mg daily in addition to carvedilol  3.125 mg twice a day.  He is anticoagulated on Eliquis  2.5 mg twice a day.  He is on rosuvastatin  40 mg.  For restless legs he also has been on Requip .   I saw him in August 2019.  At that time he was doing well and I discussed potential transition to [facility] depending upon improvement in his renal function.  He is followed by Dr. Lydia Sams for his kidney disease.  Creatinine on December 01, 2017 was 2.46.  He denies recurrent chest pain or bleeding.  He is followed by Dr. Nolene Baumgarten since continues to have no interest in pursuing carotid revascularization.    I last saw him in March 2020 at which time he was continuing to do well and remains very active.    He sees Dr. Lydia Sams who follows his chronic kidney disease.    I saw him in September 2020 at which time he denied any episodes of dizziness, paresthesias and continued to remain active.  In  April he cut his finger on a table saw.  He works every day in the garden and the week prior to his office visit he built a fence in his yard.  He feels that he has not had any reduction in his energy level and denies any exertional shortness of breath or chest pain.  During that evaluation his blood pressure was stable on lisinopril  5 mg, carvedilol  3.125 mg twice a day, and furosemide  40 mg.  He continues to be on rosuvastatin  40 mg for aggressive lipid management and attempt to induce plaque regression.  There was no bleeding on Eliquis .  I saw him in March 2021 at which time he continued to be asymptomatic.  He was seeing Dr. Lydia Sams for renal insufficiency and creatinine on May 17, 2019 was 1.82.  Lipid studies revealed an LDL cholesterol of 83.  He denies chest pain PND orthopnea presyncope or syncope.   He was evaluated in October 2021 by Marcie Sever, PA and remained stable.  I last saw him on August 28, 2020 at which time he continued to be extremely active.  He is cutting his grass, building porches index and driving a tractor.  He tells me when he last saw Dr. Lydia Sams his kidney function had improved.  He has been on Eliquis  at reduced dose due to his age of greater than 82 and creatinine greater than 1.5.  Presently continues to be on Eliquis  2.5 mg twice a day, lisinopril  5 mg, furosemide  20 mg daily in addition to carvedilol  3.125 mg twice a day.  He denies any paresthesias or weakness.  He continues to be on Zetia  10 mg and rosuvastatin  40 mg for aggressive lipid management.  LDL cholesterol in March 2022 was excellent at 33.  Total cholesterol 96.  At that time creatinine was 1.37.    Since I saw him, he developed COVID on March 14, 2021.  He was sick for approximately a month.  He was hospitalized 29 and discharged on April 17, 2021 with acute on chronic CHF.  He was subsequently evaluated by Ervin Heath, PA on May 21, 2021.  His creatinine had increased to 1.6.  His Lasix  dose was reduced to 20 mg following his hospitalization and it was recommended by Arvie Latus that he reduce Lasix  further to every other day and reduce potassium supplementation to 10 mg every other day as well.  Follow-up laboratory on June 02, 2021 showed a creatinine improved slightly at 1.43.  Potassium was 4.6  When I saw him on September 10, 2021  he felt well and denied any recurrent chest pain, palpitations presyncope or syncope.  He continued to be on carvedilol  3.125 mg twice a day, furosemide  20 mg every other day with every other day 10 mill equivalent potassium.  Lipids are treated with Zetia  10 mg and rosuvastatin  40 mg.  He is on levothyroxine  for mild hypothyroidism 25 mcg.  He takes gabapentin at bedtime.  Most recent LDL cholesterol was 33  I last saw him on February 18, 2022.  He continued to feel exceptionally well and was remaining active at 89 years old.  He states his blood pressure at home typically runs between 118  and 120.  He sees Dr. Lydia Sams for CKD and on November 19, 2021 creatinine was 1.6.  Potassium was 4.9.  He continues to be on carvedilol  3.125 mg twice a day.  He is on Eliquis  at reduced dose 2.5 mg for anticoagulation he is on Zetia  and rosuvastatin   40 mg for hyperlipidemia and LDL cholesterol was excellent at 41 on December 10, 2021.  He was being followed by Dr. Camilo Cella [name] for primary care.  Since I last saw him, he was evaluated by Ervin Heath, PA on October 21, 2018 for he was not having any chest pain.  With his creatinine in the 1.6 range, he was on reduced dose of Eliquis  for anticoagulation with his permanent atrial fibrillation.  Presently, he feels well and denies any chest pain or shortness of breath.  He walks with a cane.  He does experience some back discomfort.  He continues to be on reduced dose Eliquis  2.5 mg twice a day, carvedilol  3.125 mg twice a day and is on Zetia  10 mg and rosuvastatin  40 mg for hyperlipidemia.  He is on low-dose levothyroxine  for hypothyroidism at 25 mcg and takes ropinirole  for restless leg syndrome.  He is followed by nephrology.  Creatinine on June 02, 2023 was 1.6.  He presents for evaluation.  Past Medical History:  Diagnosis Date   Acute respiratory failure (HCC)    Appendicitis with abscess 03/31/2011   Arthritis    CAD (coronary artery disease),CABG 1993-LIMA to the LAD, SVG to Om and SVG to PDA/PLA, patent on cath 2008. 2003   a. s/p CABG 2003. b. last cath 2008 with patent grafts.   Chronic atrial fibrillation (HCC)    permanent   Chronic systolic CHF (congestive heart failure) (HCC)    a. Prior low EF, later normalized.   COVID 03/14/2021   Esophageal obstruction due to food impaction    Esophageal stricture    Essential hypertension    Food impaction of esophagus    GERD (gastroesophageal reflux disease)    Heart murmur    Hyperlipidemia    Ischemic cardiomyopathy    Kidney stones    "passed all but one time when he had to have  lithotripsy" (01/21/2013)   Renal infarct (HCC)    a. 07/2015 - after holding Xarelto  x 3 days for spinal procedure.   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 felt vasovagal in setting of pain from renal infarct.    Past Surgical History:  Procedure Laterality Date   BALLOON DILATION N/A 05/22/2015   Procedure: BALLOON DILATION;  Surgeon: Kenney Peacemaker, MD;  Location: [facility] ENDOSCOPY;  Service: Endoscopy;  Laterality: N/A;   BALLOON DILATION N/A 12/15/2018   Procedure: BALLOON DILATION;  Surgeon: Kenney Peacemaker, MD;  Location: [facility] ENDOSCOPY;  Service: Endoscopy;  Laterality: N/A;   BALLOON DILATION N/A 10/14/2021   Procedure: BALLOON DILATION;  Surgeon: Elois Hair, MD;  Location: [facility] ENDOSCOPY;  Service: Gastroenterology;  Laterality: N/A;   BALLOON DILATION N/A 10/24/2021   Procedure: BALLOON DILATION;  Surgeon: Kenney Peacemaker, MD;  Location: [facility] ENDOSCOPY;  Service: Gastroenterology;  Laterality: N/A;   CARDIAC CATHETERIZATION  02/24/2002   reduced LV function, 60-70% prox RCA stenosis, 70% PLA ostial stenosis, 80% secondary branch of PLA stenosis - subsequent CABG (Dr. Jammie Mccune)   Carotid Doppler  06/2012   50-69% right bulb/prox ICA diameter reduction; 70-99% left bulb/prox ICA diameter reduction   CATARACT EXTRACTION W/ INTRAOCULAR LENS IMPLANT Bilateral 2013   COLONOSCOPY N/A 05/18/2016   Procedure: COLONOSCOPY;  Surgeon: Danette Duos, MD;  Location: [facility] ENDOSCOPY;  Service: Gastroenterology;  Laterality: N/A;   CORONARY  ANGIOPLASTY  07/05/2006   3 vessel CAD, patent LIMA to LAD, patentVG to OM, patent SVG to PDA & PLA, mild MR, severe LV systolic dysfunction (Dr. Dina Francisco)   CORONARY ARTERY BYPASS GRAFT  03/01/2002   LIMA to LAD, reverse SVG to OM, reverse SVG to PDA of RCA, reverse SVG to PLA of RCA, ligation of LA appendage (Dr. Nicanor Barge)    ENTEROSCOPY N/A 05/20/2016   Procedure: ENTEROSCOPY;  Surgeon: Danette Duos, MD;  Location: [facility] ENDOSCOPY;  Service: Gastroenterology;  Laterality: N/A;   ESOPHAGOGASTRODUODENOSCOPY  02/01/2012   Procedure: ESOPHAGOGASTRODUODENOSCOPY (EGD);  Surgeon: Almeda Aris, MD;  Location: [facility]OSCOPY;  Service: Endoscopy;  Laterality: N/A;   ESOPHAGOGASTRODUODENOSCOPY N/A 05/22/2015   Procedure: ESOPHAGOGASTRODUODENOSCOPY (EGD);  Surgeon: Kenney Peacemaker, MD;  Location: [facility]OSCOPY;  Service: Endoscopy;  Laterality: N/A;   ESOPHAGOGASTRODUODENOSCOPY N/A 05/17/2016   Procedure: ESOPHAGOGASTRODUODENOSCOPY (EGD);  Surgeon: Tami Falcon, MD;  Location: [facility] ENDOSCOPY;  Service: Endoscopy;  Laterality: N/A;   ESOPHAGOGASTRODUODENOSCOPY N/A 09/17/2017   Procedure: ESOPHAGOGASTRODUODENOSCOPY (EGD);  Surgeon: Janel Medford, MD;  Location: [facility] ENDOSCOPY;  Service: Endoscopy;  Laterality: N/A;   ESOPHAGOGASTRODUODENOSCOPY Left 02/09/2020   Procedure: ESOPHAGOGASTRODUODENOSCOPY (EGD);  Surgeon: Annis Kinder, DO;  Location: [facility] ENDOSCOPY;  Service: Gastroenterology;  Laterality: Left;   ESOPHAGOGASTRODUODENOSCOPY (EGD) WITH PROPOFOL  N/A 12/15/2018   Procedure: ESOPHAGOGASTRODUODENOSCOPY (EGD) WITH PROPOFOL ;  Surgeon: Kenney Peacemaker, MD;  Location: [facility] ENDOSCOPY;  Service: Endoscopy;  Laterality: N/A;   ESOPHAGOGASTRODUODENOSCOPY (EGD) WITH PROPOFOL  N/A 04/07/2021   Procedure: ESOPHAGOGASTRODUODENOSCOPY (EGD) WITH PROPOFOL ;  Surgeon: Janel Medford, MD;  Location: [facility] ENDOSCOPY;  Service: Endoscopy;  Laterality: N/A;   ESOPHAGOGASTRODUODENOSCOPY (EGD) WITH PROPOFOL  N/A 10/14/2021   Procedure: ESOPHAGOGASTRODUODENOSCOPY (EGD) WITH PROPOFOL ;  Surgeon: Elois Hair, MD;  Location: [facility] ENDOSCOPY;  Service: Gastroenterology;  Laterality: N/A;   ESOPHAGOGASTRODUODENOSCOPY (EGD) WITH PROPOFOL  N/A 10/24/2021   Procedure: ESOPHAGOGASTRODUODENOSCOPY (EGD) WITH PROPOFOL ;  Surgeon: Kenney Peacemaker, MD;  Location: [facility]  ENDOSCOPY;  Service: Gastroenterology;  Laterality: N/A;   FOREIGN BODY REMOVAL  09/17/2017   Procedure: FOREIGN BODY REMOVAL;  Surgeon: Janel Medford, MD;  Location: [facility] ENDOSCOPY;  Service: Endoscopy;;   FOREIGN BODY REMOVAL  12/15/2018   Procedure: FOREIGN BODY REMOVAL;  Surgeon: Kenney Peacemaker, MD;  Location: [facility] ENDOSCOPY;  Service: Endoscopy;;   FOREIGN BODY REMOVAL  02/09/2020   Procedure: FOREIGN BODY REMOVAL;  Surgeon: Annis Kinder, DO;  Location: [facility] ENDOSCOPY;  Service: Gastroenterology;;   FOREIGN BODY REMOVAL  10/14/2021   Procedure: FOREIGN BODY REMOVAL;  Surgeon: Elois Hair, MD;  Location: [facility] ENDOSCOPY;  Service: Gastroenterology;;   GIVENS CAPSULE STUDY N/A 05/18/2016   Procedure: GIVENS CAPSULE STUDY;  Surgeon: Danette Duos, MD;  Location: [facility] ENDOSCOPY;  Service: Gastroenterology;  Laterality: N/A;   LAPAROSCOPIC APPENDECTOMY  03/30/2011   Procedure: APPENDECTOMY LAPAROSCOPIC;  Surgeon: Brandy Cal. Cornett, MD;  Location: [facility] OR;  Service: General;  Laterality: N/A;   LEFT HEART CATHETERIZATION WITH CORONARY ANGIOGRAM N/A 01/24/2013   Procedure: LEFT HEART CATHETERIZATION WITH CORONARY ANGIOGRAM;  Surgeon: Avanell Leigh, MD;  Location: [facility] CATH LAB;  Service: Cardiovascular;  Laterality: N/A;  Right heart with grafts   LITHOTRIPSY     "once" (01/21/2013)   NM MYOCAR PERF WALL MOTION  05/2009   persantine myoview - fixed moderate perfusion defect in inferior wall & lateral segment of apex (poor non-transmural infarction), minimal anterolateral periinfarct reversible ischemia seen, abnormal study, defects similar to 2006 study   RIGHT/LEFT  HEART CATH AND CORONARY ANGIOGRAPHY N/A 09/17/2017   Procedure: RIGHT/LEFT HEART CATH AND CORONARY ANGIOGRAPHY;  Surgeon: Swaziland, Peter M, MD;  Location: [facility] INVASIVE CV LAB;  Service: Cardiovascular;  Laterality: N/A;   SPLENECTOMY, TOTAL  01/2013   TEE WITHOUT CARDIOVERSION N/A 01/23/2013   Procedure: TRANSESOPHAGEAL  ECHOCARDIOGRAM (TEE);  Surgeon: Luana Rumple, MD;  Location: [facility] ENDOSCOPY;  Service: Cardiovascular;  Laterality: N/A;   TRANSTHORACIC ECHOCARDIOGRAM  06/23/2012   EF 40-45%, mild LVH; mild AV regurg; mild MV regurg; LV mod-severely dilated; RV mildly dilated; systolic pressure borderline increased; RA mod dilated    Allergies  Allergen Reactions   Percocet [Oxycodone -Acetaminophen ] Itching and Nausea Only    Current Outpatient Medications  Medication Sig Dispense Refill   acetaminophen  (TYLENOL ) 500 MG tablet Take 500 mg by mouth every 6 (six) hours as needed for moderate pain.     apixaban  (ELIQUIS ) 2.5 MG TABS tablet TAKE 1 TABLET(2.5 MG) BY MOUTH TWICE DAILY 180 tablet 3   gabapentin (NEURONTIN) 100 MG capsule Take 100 mg by mouth at bedtime as needed (nerve pain.).     levothyroxine  (SYNTHROID ) 25 MCG tablet Take 1 tablet (25 mcg total) by mouth daily before breakfast. 90 tablet 0   Melatonin 10 MG TABS Take 10 mg by mouth at bedtime as needed (sleep).     rOPINIRole  (REQUIP ) 0.25 MG tablet TAKE 1 TABLET(0.25 MG) BY MOUTH IN THE MORNING AND AT BEDTIME 180 tablet 0   tiZANidine  (ZANAFLEX ) 4 MG tablet TAKE 1 TABLET(4 MG) BY MOUTH EVERY 8 HOURS AS NEEDED FOR MUSCLE SPASMS. DO NOT COMBINE WITH TRAMADOL  (Patient taking differently: Take 4 mg by mouth at bedtime as needed for muscle spasms.) 60 tablet 1   traMADol  (ULTRAM ) 50 MG tablet TAKE 1/2 TABLET BY MOUTH EVERY 8 HOURS AS NEEDED FOR PAIN 90 tablet 1   carvedilol  (COREG ) 3.125 MG tablet TAKE 1 TABLET(3.125 MG) BY MOUTH TWICE DAILY WITH A MEAL 180 tablet 3   ezetimibe  (ZETIA ) 10 MG tablet Take 1 tablet (10 mg total) by mouth daily. 90 tablet 3   pantoprazole  (PROTONIX ) 40 MG tablet TAKE 1 TABLET(40 MG) BY MOUTH TWICE DAILY 180 tablet 2   rosuvastatin  (CRESTOR ) 40 MG tablet TAKE 1 TABLET(40 MG) BY MOUTH DAILY 90 tablet 3   No current facility-administered medications for this visit.    Social History   Socioeconomic History   Marital  status: Married    Spouse name: Mansfield Seip   Number of children: 9   Years of education: 8   Highest education level: Not on file  Occupational History   Occupation: retired  Tobacco Use   Smoking status: Former    Current packs/day: 0.00    Average packs/day: 2.0 packs/day for 20.0 years (40.0 ttl pk-yrs)    Types: Cigarettes    Start date: 04/08/1966    Quit date: 04/08/1986    Years since quitting: 37.2   Smokeless tobacco: Current    Types: Chew   Tobacco comments:    Encouraged cessation  Vaping Use   Vaping status: Never Used  Substance and Sexual Activity   Alcohol use: No    Alcohol/week: 0.0 standard drinks of alcohol   Drug use: No   Sexual activity: Not on file  Other Topics Concern   Not on file  Social History Narrative    married, 5 sons 4 daughters. He is retired Scientist, water quality. 2-3 caffeinated beverages a day no alcohol   Social Drivers of Corporate investment banker  Strain: Low Risk  (04/15/2021)   Overall Financial Resource Strain (CARDIA)    Difficulty of Paying Living Expenses: Not hard at all  Food Insecurity: No Food Insecurity (04/29/2022)   Hunger Vital Sign    Worried About Running Out of Food in the Last Year: Never true    Ran Out of Food in the Last Year: Never true  Transportation Needs: No Transportation Needs (04/29/2022)   PRAPARE - Administrator, Civil Service (Medical): No    Lack of Transportation (Non-Medical): No  Physical Activity: Not on file  Stress: Not on file  Social Connections: Not on file  Intimate Partner Violence: Not on file    Family History  Problem Relation Age of Onset   Heart disease Father        rheumatic fever   Heart attack Brother 49   Stroke Mother    Stroke Brother 49   Stomach cancer Neg Hx    Rectal cancer Neg Hx    Prostate cancer Neg Hx    Pancreatic cancer Neg Hx    Ovarian cancer Neg Hx    ROS General: Negative; No fevers, chills, or night sweats;  HEENT: Negative; No changes in vision  or hearing, sinus congestion, difficulty swallowing Pulmonary: Negative; No cough, wheezing, shortness of breath, hemoptysis Cardiovascular: See history of present illness GI: positive for GERD; No nausea, vomiting, diarrhea, or abdominal pain GU: Renal infarct Musculoskeletal: Back discomfort; walks with cane Hematologic/Oncology: Negative; no easy bruising, bleeding Endocrine: Negative; no heat/cold intolerance; no diabetes Neuro: positive for remote CVA Skin: Negative; No rashes or skin lesions Psychiatric: Negative; No behavioral problems, depression Sleep: Positive for restless legs; no snoring, daytime sleepiness, hypersomnolence, bruxism, , hypnogognic hallucinations, no cataplexy Other comprehensive 14 point system review is negative.   PE BP (!) 124/58   Pulse 62   Ht 5\' 6"  (1.676 m)   Wt 180 lb 12.8 oz (82 kg)   SpO2 95%   BMI 29.18 kg/m    Repeat blood pressure by me was repeat blood pressure by me was 122/64  Wt Readings from Last 3 Encounters:  07/07/23 180 lb 12.8 oz (82 kg)  06/23/23 180 lb 9.6 oz (81.9 kg)  02/10/23 177 lb 9.6 oz (80.6 kg)   General: Alert, oriented, no distress.  Bearded Skin: normal turgor, no rashes, warm and dry HEENT: Normocephalic, atraumatic. Pupils equal round and reactive to light; sclera anicteric; extraocular muscles intact;  Nose without nasal septal hypertrophy Mouth/Parynx benign; Mallinpatti scale 3 Neck: Soft bilateral carotid bruits; no JVD,  Lungs: clear to ausculatation and percussion; no wheezing or rales Chest wall: without tenderness to palpitation Heart: PMI not displaced, irregularly irregular consistent with atrial fibrillation rate controlled, s1 s2 normal, 1/6 systolic murmur, no diastolic murmur, no rubs, gallops, thrills, or heaves Abdomen: soft, nontender; no hepatosplenomehaly, BS+; abdominal aorta nontender and not dilated by palpation. Back: no CVA tenderness Pulses 2+ Musculoskeletal: full range of motion,  normal strength, no joint deformities Extremities: no clubbing cyanosis or edema, Homan's sign negative  Neurologic: grossly nonfocal; Cranial nerves grossly wnl Psychologic: Normal mood and affect  EKG Interpretation Date/Time:  Wednesday July 07 2023 11:41:10 EDT Ventricular Rate:  62 PR Interval:    QRS Duration:  78 QT Interval:  382 QTC Calculation: 387 R Axis:   -52  Text Interpretation: Atrial fibrillation with premature ventricular or aberrantly conducted complexes Left axis deviation Low voltage QRS Inferior infarct (cited on or before 21-Oct-2022) Possible Anterolateral infarct (  cited on or before 21-Oct-2022) When compared with ECG of 21-Oct-2022 13:27, Nonspecific T wave abnormality now evident in Inferior leads Confirmed by Magnus Schuller ([id number]) on 07/09/2023 8:48:35 AM     February 18, 2022 ECG (independently read by me): Atrial fibrillation at 78, LAD, PRWP  September 10, 2021  ECG (independently read by me): Atrial fibrillation at 54 bpm, left axis deviation: Prior inferior infarct with Q waves inferiorly and previous Q waves anteriorly V1-6  August 28, 2020 ECG (independently read by me):  Atrial fibrillation at 70; LAD, Inferior Q waves, QS V1-20 May 2019 ECG (independently read by me): Atrial fibrillation at 72 bpm, isolated PVC.  QRS voltage.  Inferior Q waves.  QS complex V1 through V6.  September 2020 ECG (independently read by me): Atrial fibrillation with ventricular rate at 50 bpm.  QS complex V1 through V4  March 2020 ECG (independently read by me): Atrial fibrillation at 66 bpm.  Right axis deviation.  Poor anterior R wave progression V1 through V5.  DC interval for 36 ms.  January 13, 2018 ECG (independently read by me): Atrial fibrillation at 65 bpm.  Inferior Q waves.  Poor anterior R wave progression V1 through V4  August 2019 ECG (independently read by me): AF vs junctional at 57; old anterolateral MI; QTc 497 msec  July 2018 ECG (independently read by me):  Atrial fibrillation at 60 bpm.  Low voltage.  Probable old anterolateral MI with poor anterior R-wave progression.  Small inferior Q waves.  December 2017 ECG (independently read by me): Atrial fibrillation with a rate at 58 bpm.  QS complex anteriorly.  Low voltage.  September 2017 ECG (independently read by me): Atrial fibrillation with premature ventricular beats.  Poor R wave progression anteriorly.  Small nondiagnostic inferior Q waves.  December 2016 ECG (independently read by me): atrial fibrillation with rate in the 60s with occasional PVC.  QTc interval 452 ms  August 2016 ECG (independently read by me): Atrial fibrillation at 67 bpm.  2 ventricular premature beats.  Poor R-wave progression  May 2016 ECG (independently read by me): Atrial fibrillation with occasional PVCs.  Heart rate 72.  ECG (independently read by me): Atrial fibrillation with controlled ventricular response in the 70s.  Poor anterior R-wave progression.  Prior April 2015 ECG (independently read by me): Atrial fibrillation at 56 beats per minute.  QTc interval 395 ms  Prior 02/23/2013 ECG: Atrial fibrillation at 80 beats per minute with isolated PVC.  LABS:    Latest Ref Rng & Units 06/23/2023    1:28 PM 06/02/2023   12:00 AM 11/19/2021   12:00 AM  BMP  Glucose 70 - 99 mg/dL 94     BUN 6 - 23 mg/dL 18  20     43   Creatinine 0.40 - 1.50 mg/dL 6.04  1.6     1.6   Sodium 135 - 145 mEq/L 140  140     141   Potassium 3.5 - 5.1 mEq/L 4.8  3.7     4.9   Chloride 96 - 112 mEq/L 104  107     106   CO2 19 - 32 mEq/L 30  27     27    Calcium  8.4 - 10.5 mg/dL 8.8  9.2     9.9      This result is from an external source.      Latest Ref Rng & Units 06/23/2023    1:28 PM 06/02/2023   12:00 AM  11/19/2021   12:00 AM  Hepatic Function  Total Protein 6.0 - 8.3 g/dL 6.3     Albumin  3.5 - 5.2 g/dL 3.6  3.8     4.1   AST 0 - 37 U/L 32     ALT 0 - 53 U/L 20     Alk Phosphatase 39 - 117 U/L 82     Total Bilirubin 0.2 - 1.2  mg/dL 0.5        This result is from an external source.      Latest Ref Rng & Units 06/23/2023    1:28 PM 10/14/2021    4:09 PM 04/21/2021    2:28 PM  CBC  WBC 4.0 - 10.5 K/uL 6.1  9.8  7.0   Hemoglobin 13.0 - 17.0 g/dL 65.7  84.6  9.1   Hematocrit 39.0 - 52.0 % 35.4  39.7  28.2   Platelets 150.0 - 400.0 K/uL 166.0  165  426.0    Lab Results  Component Value Date   MCV 98.4 06/23/2023   MCV 96.8 10/14/2021   MCV 93.5 04/21/2021   Lab Results  Component Value Date   TSH 4.46 06/23/2023   Lab Results  Component Value Date   HGBA1C 11.1 06/02/2023   Lipid Panel     Component Value Date/Time   CHOL 126 12/10/2021 1326   CHOL 136 05/18/2018 1355   TRIG 137.0 12/10/2021 1326   HDL 57.00 12/10/2021 1326   HDL 56 05/18/2018 1355   CHOLHDL 2 12/10/2021 1326   VLDL 27.4 12/10/2021 1326   LDLCALC 41 12/10/2021 1326   LDLCALC 67 11/22/2019 1320     IMPRESSION:  1. Coronary artery disease involving native coronary artery of native heart without angina pectoris   2. Hx of CABG   3. Essential hypertension   4. Hyperlipidemia with target low density lipoprotein (LDL) cholesterol less than 50 mg/dL   5. Bilateral carotid artery stenosis   6. Permanent atrial fibrillation (HCC)   7. Cardiomyopathy, ischemic, improved   8. Anticoagulated on Eliquis      ASSESSMENT AND PLAN: [name] is an 89 year-old gentleman who is status post CABG revascularization surgery in 2003. He has a history of an ischemic cardiomyopathy with an initial ejection fraction of 25% with subsequent normalization at 55-60%; however, on his echo Doppler study during his July 2019 hospitalization EF was again 25 to 30%.  There was mild AR, mild to moderate MR, biatrial enlargement and mild TR.  He had akinesis of the distal anteroseptal wall and apex.  He has a remote history of a small right brain CVA in 2005  with subsequent recovery. 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  When I last saw him he was on a reduced dose of Eliquis  in light of his age greater than 65 and renal dysfunction.  He has documented high-grade carotid disease and was followed by Dr. Nolene Baumgarten but had no interest in pursuing surgery or stenting.  Over the last several years he has continued to do remarkably well.  He does not have any anginal symptoms.  There is no significant shortness of breath.  He has bilateral carotid bruits due to his significant carotid disease I denies any neurologic symptoms.  He now walks with a cane.  He continues to be  on reduced dose Eliquis  with his age and creatinine greater than 1.5.  He is on low-dose levothyroxine  for hypothyroidism.  He is followed by Jessica Koplin who has been checking laboratory.  LDL cholesterol had been reduced to 41 in September 2023 with total cholesterol 126 HDL 57 and triglycerides 137.  I have recommended that the next time his renal function labs are checked in the near future by Dr. Lydia Sams that they also check fasting lipid panel for reassessment.  I discussed with him my plans for upcoming retirement in several months.  I will transition him to the cardiology care of Dr. Carson Clara and will set up an initial evaluation in approximately 9 months.  Millicent Ally, MD, [facility]  07/09/2023 8:56 AM

## 2023-07-16 DIAGNOSIS — M25511 Pain in right shoulder: Secondary | ICD-10-CM | POA: Diagnosis not present

## 2023-07-21 DIAGNOSIS — N183 Chronic kidney disease, stage 3 unspecified: Secondary | ICD-10-CM | POA: Diagnosis not present

## 2023-08-02 ENCOUNTER — Other Ambulatory Visit: Payer: Self-pay | Admitting: Family Medicine

## 2023-08-02 DIAGNOSIS — G2581 Restless legs syndrome: Secondary | ICD-10-CM

## 2023-08-23 ENCOUNTER — Other Ambulatory Visit: Payer: Self-pay | Admitting: Cardiovascular Disease

## 2023-08-23 DIAGNOSIS — Z7901 Long term (current) use of anticoagulants: Secondary | ICD-10-CM

## 2023-08-23 DIAGNOSIS — E039 Hypothyroidism, unspecified: Secondary | ICD-10-CM

## 2023-08-23 MED ORDER — LEVOTHYROXINE SODIUM 25 MCG PO TABS: 25.0000 ug | ORAL_TABLET | Freq: Every day | ORAL | 3 refills | Status: AC

## 2023-08-23 MED ORDER — CARVEDILOL 3.125 MG PO TABS: ORAL_TABLET | ORAL | 3 refills | Status: DC

## 2023-08-23 MED ORDER — APIXABAN 2.5 MG PO TABS: ORAL_TABLET | ORAL | 3 refills | Status: DC

## 2023-08-23 NOTE — Telephone Encounter (Signed)
*  STAT* If patient is at the pharmacy, call can be transferred to refill team.   1. Which medications need to be refilled? (please list name of each medication and dose if known) apixaban  (ELIQUIS ) 2.5 MG TABS tablet  carvedilol  (COREG ) 3.125 MG tablet  levothyroxine  (SYNTHROID ) 25 MCG tablet    2. Would you like to learn more about the convenience, safety, & potential cost savings by using the [facility]?     3. Are you open to using the [facility] (Type [facility]. ).   4. Which pharmacy/location (including street and city if local pharmacy) is medication to be sent to?  [id number] - [place], [place] - [address] AT [facility] [address]       5. Do they need a 30 day or 90 day supply? 90 day

## 2023-08-23 NOTE — Telephone Encounter (Signed)
 RX sent to requested Pharmacy

## 2023-08-23 NOTE — Telephone Encounter (Signed)
 Prescription refill request for Eliquis  received. Indication:afib Last office visit:4/25 Scr:1.40  4/25 Age: 89 Weight:82  kg  Prescription refilled

## 2023-08-24 ENCOUNTER — Telehealth: Payer: Self-pay

## 2023-08-24 NOTE — Telephone Encounter (Signed)
 Copied from CRM [id number]. Topic: Clinical - Medication Question >> Aug 24, 2023  1:58 PM Shereese L wrote: Reason for CRM: patient was taken off of pantoprazole  (PROTONIX ) 40 MG tablet by the kidney doctor and was adv that he needs if he needs to be placed on an alt medication to please call [phone]

## 2023-08-26 ENCOUNTER — Telehealth: Payer: Self-pay

## 2023-08-26 MED ORDER — FAMOTIDINE 20 MG PO TABS: 20.0000 mg | ORAL_TABLET | Freq: Two times a day (BID) | ORAL | 3 refills | Status: DC

## 2023-08-26 NOTE — Telephone Encounter (Signed)
 Copied from CRM [id number]. Topic: General - Other >> Aug 26, 2023 12:54 PM Marlan Silva wrote: Reason for CRM: Patiens daughter states that she is returning Dr. Sterling Eisenmenger' CMA's phone call. Patients daughter Wilkie Harding is requesting a call back in regards to the patient and can be reached at [phone].

## 2023-08-26 NOTE — Telephone Encounter (Signed)
 Pt returning your phone call

## 2023-08-26 NOTE — Telephone Encounter (Signed)
 I called back and left message on machine with daughter Adah Acron.  I reviewed the most recent nephrology note in the chart-I do not see any explicit mention of needing to stop his PPI.  However I would presume they probably want to switch him to an H2 blocker.  Advised that H2 blockers are typically available over-the-counter.  Please call me back or send me a MyChart message and let me know how I can assist further

## 2023-08-26 NOTE — Addendum Note (Signed)
 Addended by: Gates Kasal C on: 08/26/2023 03:33 PM   Modules accepted: Orders

## 2023-08-26 NOTE — Telephone Encounter (Signed)
 Called back and discussed with Gregory [name].  They note nephrology wanted patient to stop proton pump inhibitor, but he does have history of GERD and esophagitis.  Since he stopped his proton pump inhibitor he has been clearing his throat a lot more and having more GERD symptoms.  As such I sent in a prescription for famotidine-an H2 blocker is likely preferred in the situation.  They would let me know if any concerns  Meds ordered this encounter  Medications   famotidine (PEPCID) 20 MG tablet    Sig: Take 1 tablet (20 mg total) by mouth 2 (two) times daily.    Dispense:  90 tablet    Refill:  3

## 2023-10-22 ENCOUNTER — Encounter (HOSPITAL_COMMUNITY): Payer: Self-pay

## 2023-10-22 ENCOUNTER — Other Ambulatory Visit: Payer: Self-pay

## 2023-10-22 ENCOUNTER — Emergency Department (HOSPITAL_COMMUNITY)

## 2023-10-22 ENCOUNTER — Emergency Department (HOSPITAL_COMMUNITY)
Admission: EM | Admit: 2023-10-22 | Discharge: 2023-10-22 | Disposition: A | Discharge status: Home / Self Care | Attending: Emergency Medicine | Admitting: Emergency Medicine

## 2023-10-22 ENCOUNTER — Ambulatory Visit: Payer: Self-pay

## 2023-10-22 DIAGNOSIS — I2510 Atherosclerotic heart disease of native coronary artery without angina pectoris: Secondary | ICD-10-CM | POA: Insufficient documentation

## 2023-10-22 DIAGNOSIS — K5792 Diverticulitis of intestine, part unspecified, without perforation or abscess without bleeding: Secondary | ICD-10-CM

## 2023-10-22 DIAGNOSIS — K5732 Diverticulitis of large intestine without perforation or abscess without bleeding: Secondary | ICD-10-CM | POA: Diagnosis not present

## 2023-10-22 DIAGNOSIS — I509 Heart failure, unspecified: Secondary | ICD-10-CM | POA: Diagnosis not present

## 2023-10-22 DIAGNOSIS — Z8673 Personal history of transient ischemic attack (TIA), and cerebral infarction without residual deficits: Secondary | ICD-10-CM | POA: Insufficient documentation

## 2023-10-22 DIAGNOSIS — N281 Cyst of kidney, acquired: Secondary | ICD-10-CM | POA: Diagnosis not present

## 2023-10-22 DIAGNOSIS — Z7901 Long term (current) use of anticoagulants: Secondary | ICD-10-CM | POA: Diagnosis not present

## 2023-10-22 DIAGNOSIS — R1032 Left lower quadrant pain: Secondary | ICD-10-CM | POA: Diagnosis present

## 2023-10-22 DIAGNOSIS — K4090 Unilateral inguinal hernia, without obstruction or gangrene, not specified as recurrent: Secondary | ICD-10-CM | POA: Diagnosis not present

## 2023-10-22 LAB — URINALYSIS, ROUTINE W REFLEX MICROSCOPIC
Bacteria, UA: NONE SEEN
Bilirubin Urine: NEGATIVE
Glucose, UA: NEGATIVE mg/dL
Hgb urine dipstick: NEGATIVE
Ketones, ur: NEGATIVE mg/dL
Leukocytes,Ua: NEGATIVE
Nitrite: NEGATIVE
Protein, ur: 30 mg/dL — AB
Specific Gravity, Urine: 1.04 — ABNORMAL HIGH (ref 1.005–1.030)
pH: 6 (ref 5.0–8.0)

## 2023-10-22 LAB — CBC
HCT: 38.4 % — ABNORMAL LOW (ref 39.0–52.0)
Hemoglobin: 12.7 g/dL — ABNORMAL LOW (ref 13.0–17.0)
MCH: 32.9 pg (ref 26.0–34.0)
MCHC: 33.1 g/dL (ref 30.0–36.0)
MCV: 99.5 fL (ref 80.0–100.0)
Platelets: 141 K/uL — ABNORMAL LOW (ref 150–400)
RBC: 3.86 MIL/uL — ABNORMAL LOW (ref 4.22–5.81)
RDW: 13.3 % (ref 11.5–15.5)
WBC: 8.8 K/uL (ref 4.0–10.5)
nRBC: 0 % (ref 0.0–0.2)

## 2023-10-22 LAB — COMPREHENSIVE METABOLIC PANEL WITH GFR
ALT: 28 U/L (ref 0–44)
AST: 40 U/L (ref 15–41)
Albumin: 3 g/dL — ABNORMAL LOW (ref 3.5–5.0)
Alkaline Phosphatase: 73 U/L (ref 38–126)
Anion gap: 13 (ref 5–15)
BUN: 16 mg/dL (ref 8–23)
CO2: 22 mmol/L (ref 22–32)
Calcium: 8.7 mg/dL — ABNORMAL LOW (ref 8.9–10.3)
Chloride: 105 mmol/L (ref 98–111)
Creatinine, Ser: 1.5 mg/dL — ABNORMAL HIGH (ref 0.61–1.24)
GFR, Estimated: 44 mL/min — ABNORMAL LOW (ref >60)
Glucose, Bld: 90 mg/dL (ref 70–99)
Potassium: 4.1 mmol/L (ref 3.5–5.1)
Sodium: 140 mmol/L (ref 135–145)
Total Bilirubin: 0.9 mg/dL (ref 0.0–1.2)
Total Protein: 6.5 g/dL (ref 6.5–8.1)

## 2023-10-22 LAB — LIPASE, BLOOD: Lipase: 30 U/L (ref 11–51)

## 2023-10-22 MED ORDER — FENTANYL CITRATE PF 50 MCG/ML IJ SOSY
50.0000 ug | PREFILLED_SYRINGE | INTRAMUSCULAR | Status: DC | PRN
  Administered 2023-10-22: 50 ug via INTRAVENOUS
  Filled 2023-10-22: qty 1

## 2023-10-22 MED ORDER — SODIUM CHLORIDE 0.9 % IV SOLN: 2.0000 g | Freq: Once | INTRAVENOUS | Status: DC

## 2023-10-22 MED ORDER — ONDANSETRON 4 MG PO TBDP: 4.0000 mg | ORAL_TABLET | Freq: Three times a day (TID) | ORAL | 0 refills | Status: AC | PRN

## 2023-10-22 MED ORDER — TRAMADOL HCL 50 MG PO TABS: 50.0000 mg | ORAL_TABLET | Freq: Four times a day (QID) | ORAL | 0 refills | Status: DC | PRN

## 2023-10-22 MED ORDER — AMOXICILLIN-POT CLAVULANATE 875-125 MG PO TABS: 1.0000 | ORAL_TABLET | Freq: Two times a day (BID) | ORAL | 0 refills | Status: DC

## 2023-10-22 MED ORDER — IOHEXOL 300 MG/ML  SOLN
75.0000 mL | Freq: Once | INTRAMUSCULAR | Status: AC | PRN
Start: 2023-10-22 — End: 2023-10-22
  Administered 2023-10-22: 75 mL via INTRAVENOUS

## 2023-10-22 MED ORDER — METRONIDAZOLE 500 MG/100ML IV SOLN: 500.0000 mg | Freq: Once | INTRAVENOUS | Status: DC

## 2023-10-22 NOTE — Discharge Instructions (Addendum)
 We are prescribing you antibiotics to take for your diverticulitis.  Please take them as prescribed for the full course even if your symptoms improve.  Do not take over-the-counter pain medication such as Tylenol  for baseline pain control.  Tramadol  for breakthrough pain.  We have prescribed you nausea medication which can take as needed as well.  Return immediately for fevers, chills, severe pain or any new or worsening symptoms that are concerning to you

## 2023-10-22 NOTE — ED Notes (Signed)
 Placed on 2L [place] after med admin and O2 levels dropping to 86%

## 2023-10-22 NOTE — ED Triage Notes (Signed)
 Pt has c/o LLQ pain for 3 days, denies N/V/D.

## 2023-10-22 NOTE — Telephone Encounter (Signed)
  FYI Only or Action Required?: FYI only for provider.  Patient was last seen in primary care on 06/23/2023 by [name], Harlene BROCKS, MD.  Called Nurse Triage reporting Abdominal Pain.  Symptoms began two nights ago.  Interventions attempted: Nothing.  Symptoms are: rapidly worsening.  Triage Disposition: Go to ED Now (Notify PCP)  Patient/caregiver understands and will follow disposition?: Yes          Copied from CRM [id number]. Topic: Clinical - Red Word Triage >> Oct 22, 2023 10:24 AM Mercedes MATSU wrote: Red Word that prompted transfer to Nurse Triage: Patient states that he has really bad stomach/groin pain in the area where his belt buckle is. He is in a lot of pain to the point he is screaming. Denies any other symptoms. Reason for Disposition  [1] SEVERE pain (e.g., excruciating) AND [2] present > 1 hour  Answer Assessment - Initial Assessment Questions Patient's daughter on the phone with this Triage RN. Patient is right beside her in pain. He states that the pain is in the middle of his abdomen, below his belt buckle, sometimes slightly to the left of his abdomen Denies fever, vomiting, diarrhea Abdominal pain--started two days ago at night right below belt buckle No lifting anything heavy recently or any strenuous activity Patient states he did not eat anything out of the ordinary Daughter states vitals are good Daughter states that the patient has had a history of kidney stones  No urinary issues or problems with bowel movements Patient's daughter is advised that the recommendation at this time is for the patient to go to the Emergency Room Daughter is also advised that if anything worsens they can call 911 at any point for an ambulance to take the patient to the Emergency Room She verbalized understanding.    1. LOCATION: Where does it hurt?      Middle of abdomen just below the belt buckle 2. RADIATION: Does the pain shoot anywhere else? (e.g., chest, back)      Slightly to the left side of abdomen 3. ONSET: When did the pain begin? (Minutes, hours or days ago)      Two nights ago 4. SUDDEN: Gradual or sudden onset?     Out of nowhere with no known cause 5. PATTERN Does the pain come and go, or is it constant?     Constant and worse at times 6. SEVERITY: How bad is the pain?  (e.g., Scale 1-10; mild, moderate, or severe)     Severe 7. RECURRENT SYMPTOM: Have you ever had this type of stomach pain before? If Yes, ask: When was the last time? and What happened that time?      ---- 8. CAUSE: What do you think is causing the stomach pain? (e.g., gallstones, recent abdominal surgery)     Unknown 9. RELIEVING/AGGRAVATING FACTORS: What makes it better or worse? (e.g., antacids, bending or twisting motion, bowel movement)     Patient states if he moves, pain increases 10. OTHER SYMPTOMS: Do you have any other symptoms? (e.g., back pain, diarrhea, fever, urination pain, vomiting)       Patient denies  Protocols used: Abdominal Pain - Male-A-AH

## 2023-10-22 NOTE — ED Provider Notes (Signed)
 Received signout with Dr. Charlyn.  See his note for full HPI.  Clinical Course as of 10/22/23 1722  Fri Oct 22, 2023  1515 Signout. H/o of diverticulosis, and renal and splenic infarcts. LLQ tenderness. CT scan pending r/o diverticulitis/abscess.  [TY]  1617 CT ABDOMEN PELVIS W CONTRAST IMPRESSION: 1. Evidence of acute diverticulitis of the proximal sigmoid colon just left of midline in the lower abdomen/upper pelvis. No evidence of perforation or diverticular abscess. 2. Right renal cysts unchanged. 3. Small left inguinal hernia containing only peritoneal fat. 4. Aortic atherosclerosis. Atherosclerotic coronary artery disease.  Aortic Atherosclerosis (ICD10-I70.0).   Electronically Signed   By: Toribio Agreste M.D.   On: 10/22/2023 15:56   [TY]  1721 Patient reported on CT scan resolved and is feeling much improved after medications.  Reviewed vitals, reassuring.  Labs also reassuring no leukocytosis. tenderness to left lower quadrant on exam.  CT scan with uncomplicated diverticulitis.  I offered admission for further pain medications and IV antibiotics.  However patient declined and stated he would like to go home.  Shared decision making regarding the risks of doing so.  He does live with son and daughter notes that she will check on him daily.  His labs and vitals are reassuring.  Will discharge patient's request with antibiotics and pain medications. [TY]    Clinical Course User Index [TY] Neysa Caron PARAS, DO      Neysa Caron PARAS, [place] 10/22/23 1722

## 2023-10-31 ENCOUNTER — Other Ambulatory Visit: Payer: Self-pay | Admitting: Family Medicine

## 2023-10-31 DIAGNOSIS — G2581 Restless legs syndrome: Secondary | ICD-10-CM

## 2023-11-02 NOTE — ED Provider Notes (Signed)
 [place] EMERGENCY DEPARTMENT AT [facility] Provider Note   CSN: [id number] Arrival date & time: 10/22/23  1155     Patient presents with: Abdominal Pain   Gregory [name] is a 89 y.o. male.   HPI    Chief complaint left lower quadrant abdominal pain for the 3 days.  He has associated nausea, but denies any vomiting or diarrhea.  Patient with history of CAD, stroke, PVD, A-fib and thrombosis, splenic infarction.  Patient currently on anticoagulation.  Denies any bleeding.  No history of similar symptoms in the past.  Prior to Admission medications   Medication Sig Start Date End Date Taking? Authorizing Provider  amoxicillin -clavulanate (AUGMENTIN ) 875-125 MG tablet Take 1 tablet by mouth every 12 (twelve) hours. 10/22/23  Yes Neysa Caron PARAS, DO  ondansetron  (ZOFRAN -ODT) 4 MG disintegrating tablet Take 1 tablet (4 mg total) by mouth every 8 (eight) hours as needed for nausea or vomiting. 10/22/23  Yes Neysa Caron PARAS, DO  traMADol  (ULTRAM ) 50 MG tablet Take 1 tablet (50 mg total) by mouth every 6 (six) hours as needed. 10/22/23  Yes Neysa Caron PARAS, DO  acetaminophen  (TYLENOL ) 500 MG tablet Take 500 mg by mouth every 6 (six) hours as needed for moderate pain.    [provider]  apixaban  (ELIQUIS ) 2.5 MG TABS tablet TAKE 1 TABLET(2.5 MG) BY MOUTH TWICE DAILY 08/23/23   Burnard Debby LABOR, MD  carvedilol  (COREG ) 3.125 MG tablet TAKE 1 TABLET(3.125 MG) BY MOUTH TWICE DAILY WITH A MEAL 08/23/23   Burnard Debby LABOR, MD  ezetimibe  (ZETIA ) 10 MG tablet Take 1 tablet (10 mg total) by mouth daily. 07/07/23   Burnard Debby LABOR, MD  famotidine  (PEPCID ) 20 MG tablet Take 1 tablet (20 mg total) by mouth 2 (two) times daily. 08/26/23   [name], Jessica C, MD  gabapentin (NEURONTIN) 100 MG capsule Take 100 mg by mouth at bedtime as needed (nerve pain.). 04/30/21   [provider]  levothyroxine  (SYNTHROID ) 25 MCG tablet Take 1 tablet (25 mcg total) by mouth daily before breakfast. 08/23/23    Burnard Debby LABOR, MD  Melatonin 10 MG TABS Take 10 mg by mouth at bedtime as needed (sleep).    [provider]  pantoprazole  (PROTONIX ) 40 MG tablet TAKE 1 TABLET(40 MG) BY MOUTH TWICE DAILY 07/07/23   Burnard Debby LABOR, MD  rOPINIRole  (REQUIP ) 0.25 MG tablet TAKE 1 TABLET(0.25 MG) BY MOUTH IN THE MORNING AND AT BEDTIME 11/01/23   [name], Harlene BROCKS, MD  rosuvastatin  (CRESTOR ) 40 MG tablet TAKE 1 TABLET(40 MG) BY MOUTH DAILY 07/07/23   Burnard Debby LABOR, MD  tiZANidine  (ZANAFLEX ) 4 MG tablet TAKE 1 TABLET(4 MG) BY MOUTH EVERY 8 HOURS AS NEEDED FOR MUSCLE SPASMS. DO NOT COMBINE WITH TRAMADOL  Patient taking differently: Take 4 mg by mouth at bedtime as needed for muscle spasms. 11/27/20   [name], Harlene BROCKS, MD  traMADol  (ULTRAM ) 50 MG tablet TAKE 1/2 TABLET BY MOUTH EVERY 8 HOURS AS NEEDED FOR PAIN 06/30/23   [name], Jessica C, MD    Allergies: Percocet [oxycodone -acetaminophen ]    Review of Systems  All other systems reviewed and are negative.   Updated Vital Signs BP (!) 105/92   Pulse 82   Temp 97.7 F (36.5 C) (Oral)   Resp 16   SpO2 96%   Physical Exam Vitals and nursing note reviewed.  Constitutional:      Appearance: He is well-developed.  HENT:     Head: Atraumatic.  Cardiovascular:  Rate and Rhythm: Normal rate.  Pulmonary:     Effort: Pulmonary effort is normal.  Abdominal:     Tenderness: There is abdominal tenderness. There is guarding. There is no rebound.  Musculoskeletal:     Cervical back: Neck supple.  Skin:    General: Skin is warm.  Neurological:     Mental Status: He is alert and oriented to person, place, and time.     (all labs ordered are listed, but only abnormal results are displayed) Labs Reviewed  COMPREHENSIVE METABOLIC PANEL WITH GFR - Abnormal; Notable for the following components:      Result Value   Creatinine, Ser 1.50 (*)    Calcium  8.7 (*)    Albumin  3.0 (*)    GFR, Estimated 44 (*)    All other components within normal  limits  CBC - Abnormal; Notable for the following components:   RBC 3.86 (*)    Hemoglobin 12.7 (*)    HCT 38.4 (*)    Platelets 141 (*)    All other components within normal limits  URINALYSIS, ROUTINE W REFLEX MICROSCOPIC - Abnormal; Notable for the following components:   Specific Gravity, Urine 1.040 (*)    Protein, ur 30 (*)    All other components within normal limits  LIPASE, BLOOD    EKG: None  Radiology: No results found.   Procedures   Medications Ordered in the ED  iohexol  (OMNIPAQUE ) 300 MG/ML solution 75 mL (75 mLs Intravenous Contrast Given 10/22/23 1533)    Clinical Course as of 11/02/23 1405  Fri Oct 22, 2023  1515 Signout. H/o of diverticulosis, and renal and splenic infarcts. LLQ tenderness. CT scan pending r/o diverticulitis/abscess.  [TY]  1617 CT ABDOMEN PELVIS W CONTRAST IMPRESSION: 1. Evidence of acute diverticulitis of the proximal sigmoid colon just left of midline in the lower abdomen/upper pelvis. No evidence of perforation or diverticular abscess. 2. Right renal cysts unchanged. 3. Small left inguinal hernia containing only peritoneal fat. 4. Aortic atherosclerosis. Atherosclerotic coronary artery disease.  Aortic Atherosclerosis (ICD10-I70.0).   Electronically Signed   By: Toribio Agreste M.D.   On: 10/22/2023 15:56   [TY]  1721 Patient reported on CT scan resolved and is feeling much improved after medications.  Reviewed vitals, reassuring.  Labs also reassuring no leukocytosis. tenderness to left lower quadrant on exam.  CT scan with uncomplicated diverticulitis.  I offered admission for further pain medications and IV antibiotics.  However patient declined and stated he would like to go home.  Shared decision making regarding the risks of doing so.  He does live with son and daughter notes that she will check on him daily.  His labs and vitals are reassuring.  Will discharge patient's request with antibiotics and pain medications. [TY]     Clinical Course User Index [TY] Neysa Caron PARAS, DO                                 Medical Decision Making Amount and/or Complexity of Data Reviewed Labs: ordered. Radiology: ordered. Decision-making details documented in ED Course.  Risk Prescription drug management.   89 year old male with history of multiple medical comorbidities including A-fib, splenic infarction, CAD, A-fib, CHF, stroke comes in with chief complaint of abdominal pain.  He has history of diverticulosis.  I reviewed patient's records including previous upper and lower endoscopies and CT from 2023.  Based on the exam finding of significant localized  tenderness, patient having splenectomy and therefore high risk for severe infection -I have concerns for diverticulitis, intra-abdominal abscess.  Will get CT scan.  Clinically, not mesenteric ischemia.  Differential diagnosis at this time limited.  Diverticulitis with or without complication, intra-abdominal abscess, perforation.  Basic labs ordered along with CT scan.  Patient's care has been signed up treatment team.  Dispo per CT findings and reassessment.  Patient aware of the plan.  Final diagnoses:  Diverticulitis    ED Discharge Orders          Ordered    amoxicillin -clavulanate (AUGMENTIN ) 875-125 MG tablet  Every 12 hours        10/22/23 1734    ondansetron  (ZOFRAN -ODT) 4 MG disintegrating tablet  Every 8 hours PRN        10/22/23 1734    traMADol  (ULTRAM ) 50 MG tablet  Every 6 hours PRN        10/22/23 1734               Charlyn Sora, MD 11/02/23 1409

## 2023-11-04 DIAGNOSIS — M25511 Pain in right shoulder: Secondary | ICD-10-CM | POA: Diagnosis not present

## 2023-11-04 DIAGNOSIS — M545 Low back pain, unspecified: Secondary | ICD-10-CM | POA: Diagnosis not present

## 2023-11-12 ENCOUNTER — Telehealth: Payer: Self-pay | Admitting: Cardiology

## 2023-11-12 MED ORDER — CARVEDILOL 3.125 MG PO TABS: ORAL_TABLET | ORAL | 2 refills | Status: DC

## 2023-11-12 NOTE — Telephone Encounter (Signed)
 Pt's medication was sent to pt's pharmacy as requested. Confirmation received.

## 2023-11-12 NOTE — Telephone Encounter (Signed)
*  STAT* If patient is at the pharmacy, call can be transferred to refill team.   1. Which medications need to be refilled? (please list name of each medication and dose if known)   carvedilol  (COREG ) 3.125 MG tablet     4. Which pharmacy/location (including street and city if local pharmacy) is medication to be sent to?  [facility] #[id number] - [place], [place] - [address] AT [facility] [address]     5. Do they need a 30 day or 90 day supply? 90  She states pt did not pick up in June

## 2023-11-16 DIAGNOSIS — N2581 Secondary hyperparathyroidism of renal origin: Secondary | ICD-10-CM | POA: Diagnosis not present

## 2023-11-16 DIAGNOSIS — N183 Chronic kidney disease, stage 3 unspecified: Secondary | ICD-10-CM | POA: Diagnosis not present

## 2023-11-17 DIAGNOSIS — M5416 Radiculopathy, lumbar region: Secondary | ICD-10-CM | POA: Diagnosis not present

## 2023-11-24 DIAGNOSIS — N1832 Chronic kidney disease, stage 3b: Secondary | ICD-10-CM | POA: Diagnosis not present

## 2023-11-24 DIAGNOSIS — I129 Hypertensive chronic kidney disease with stage 1 through stage 4 chronic kidney disease, or unspecified chronic kidney disease: Secondary | ICD-10-CM | POA: Diagnosis not present

## 2023-11-24 DIAGNOSIS — D631 Anemia in chronic kidney disease: Secondary | ICD-10-CM | POA: Diagnosis not present

## 2023-11-24 DIAGNOSIS — N2581 Secondary hyperparathyroidism of renal origin: Secondary | ICD-10-CM | POA: Diagnosis not present

## 2023-12-03 IMAGING — DX DG CHEST 1V PORT
1 series · 1 of 1 positions shown · non-contrast
Comparison: Chest x-ray 03/25/2021

CLINICAL DATA: Dysphagia

EXAM:
PORTABLE CHEST 1 VIEW

[chest ap]
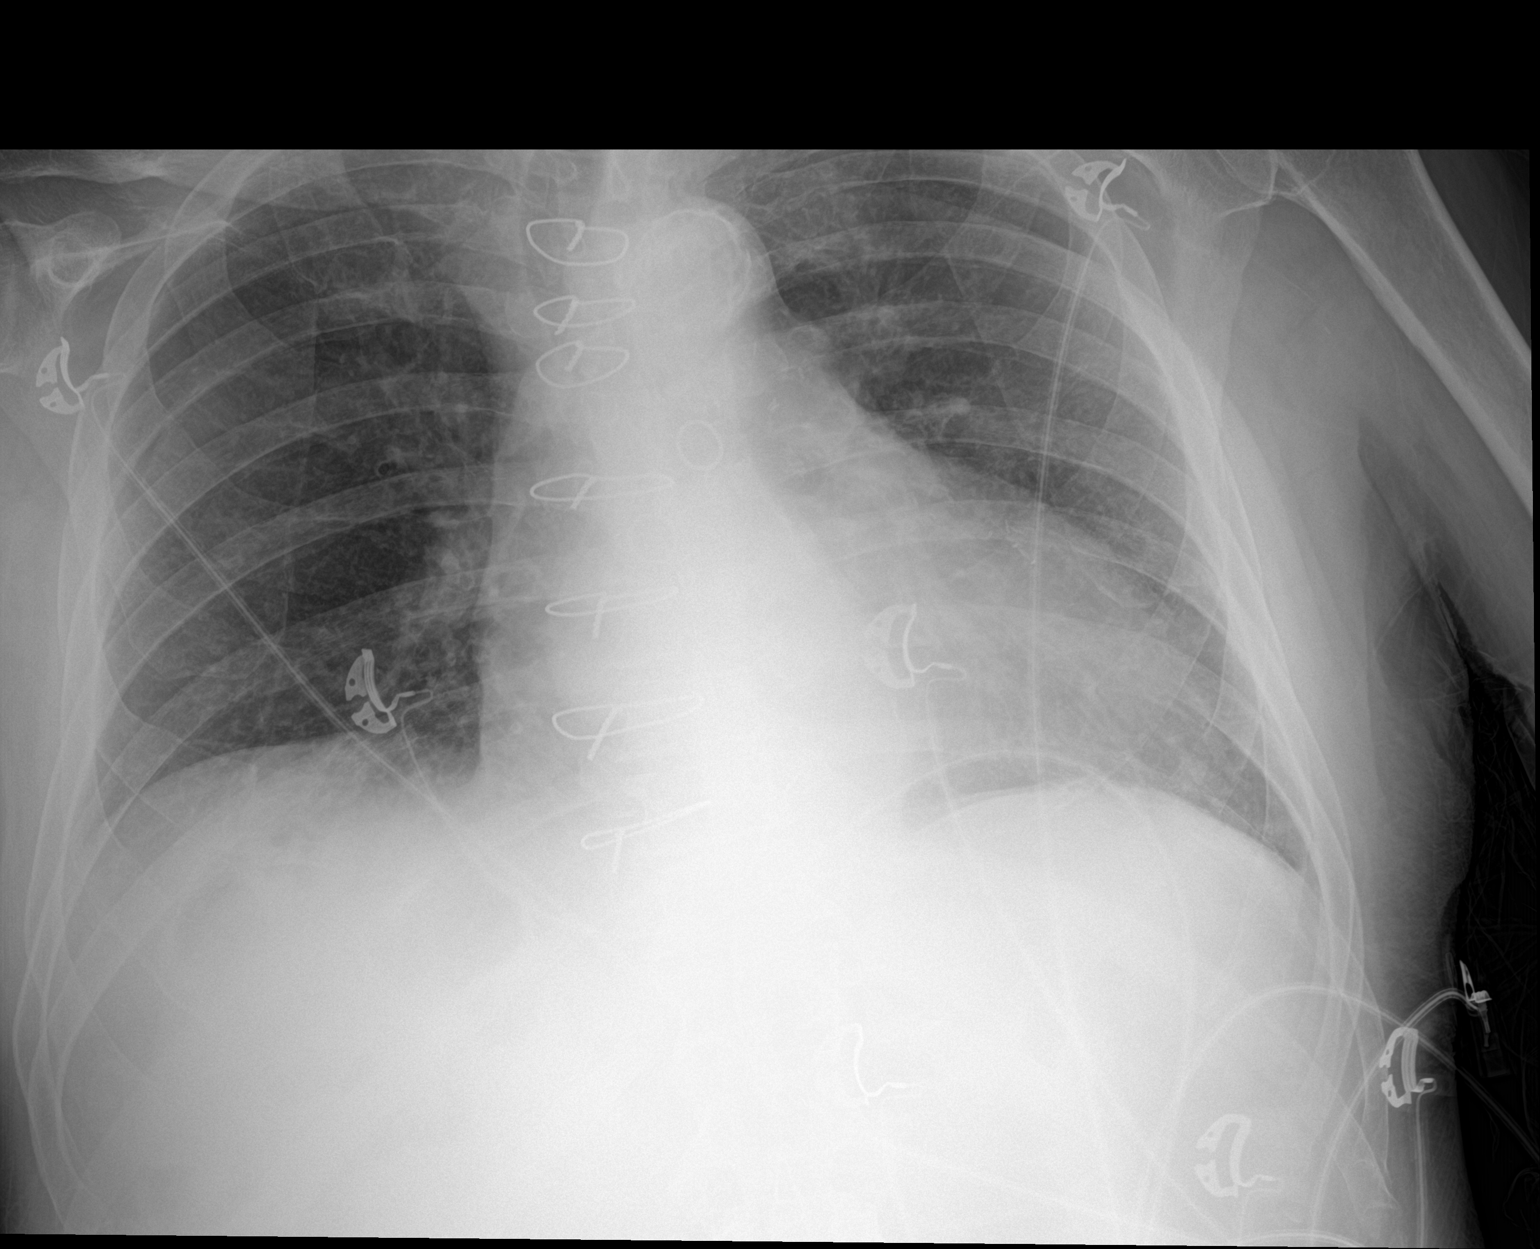

[1 of 1 positions shown; findings below may reference images not displayed]

FINDINGS: Heart is enlarged. Mediastinum appears stable. Calcified plaques in
the aortic arch. Cardiac surgical changes and median sternotomy
wires. Pulmonary vasculature is within normal limits. No focal
consolidation identified. No pleural effusion or pneumothorax.
IMPRESSION: Cardiomegaly with no acute process identified.

## 2023-12-04 IMAGING — US US ABDOMEN COMPLETE
1 series · 15 of 25 positions shown · non-contrast
Comparison: CT chest, abdomen and pelvis 03/20/2021

CLINICAL DATA: Elevated LFTs.

EXAM:
ABDOMEN ULTRASOUND COMPLETE

[Series 1: us abdomen complete mc & wl · 15 of 82 slices shown]
[im 1/82]
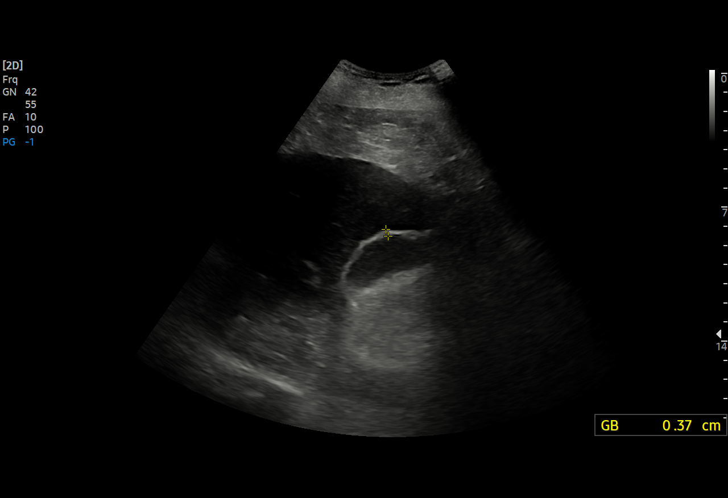
[im 7/82]
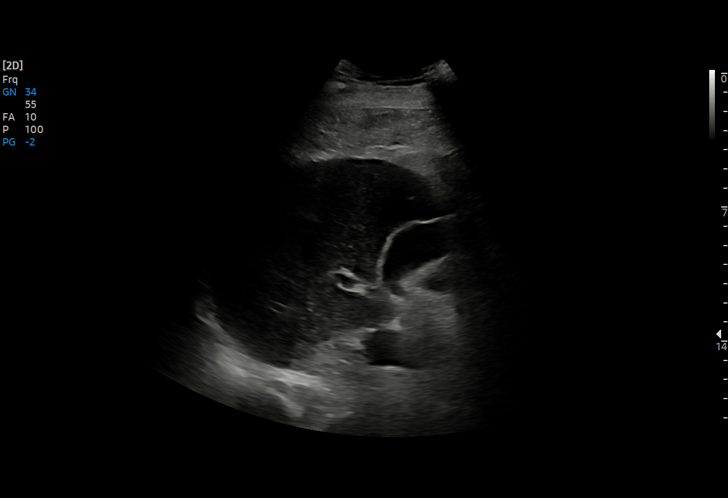
[im 14/82]
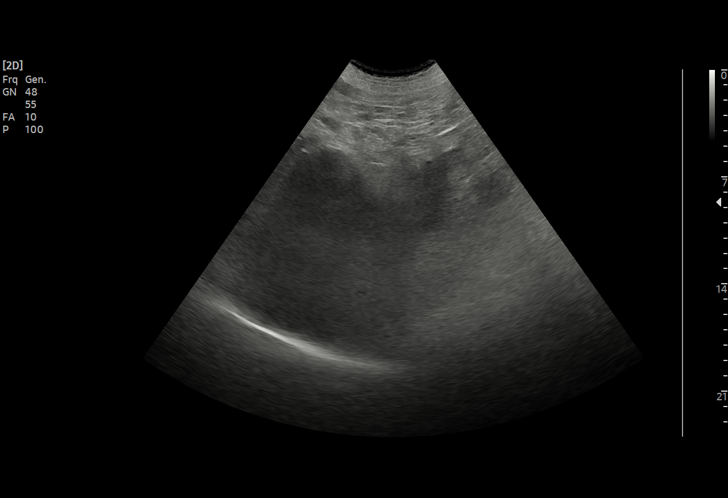
[im 17/82]
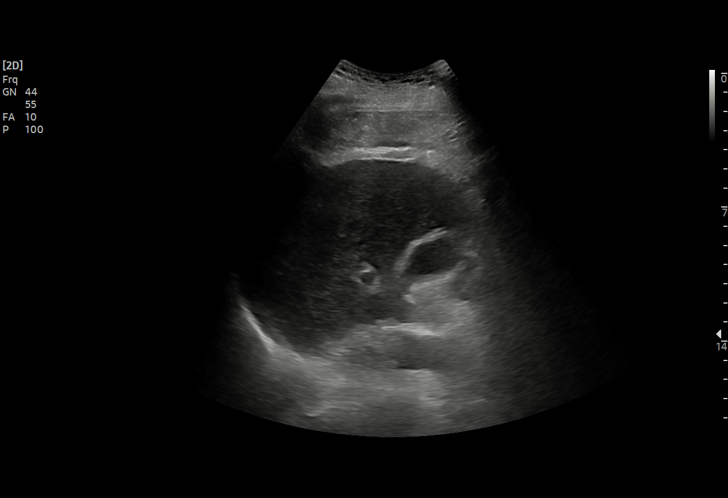
[im 24/82]
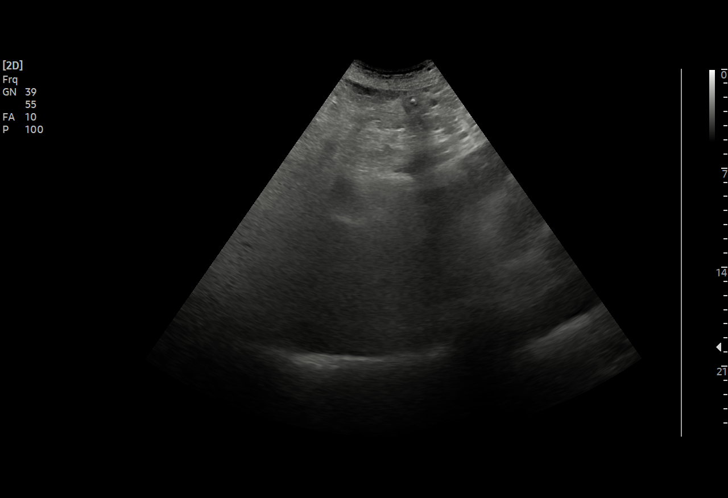
[im 31/82]
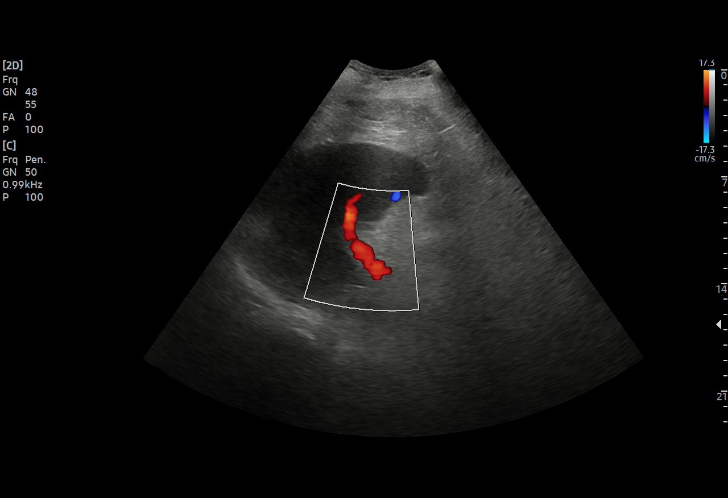
[im 34/82]
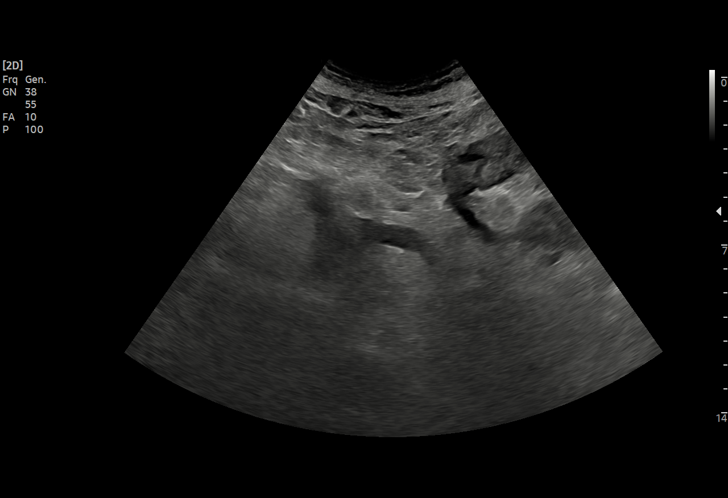
[im 41/82]
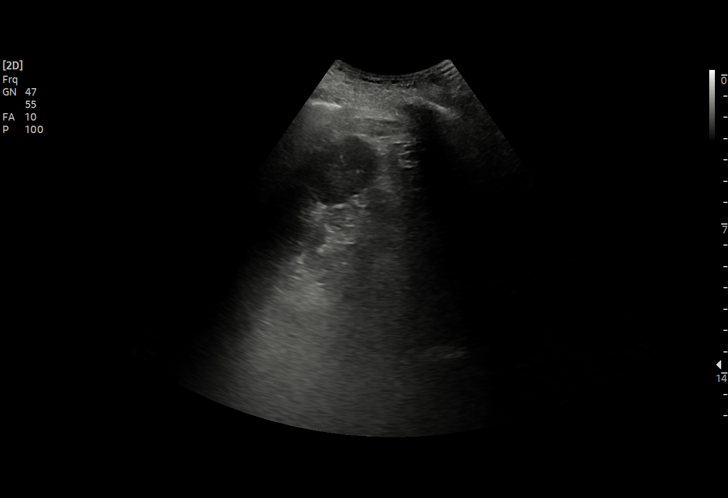
[im 48/82]
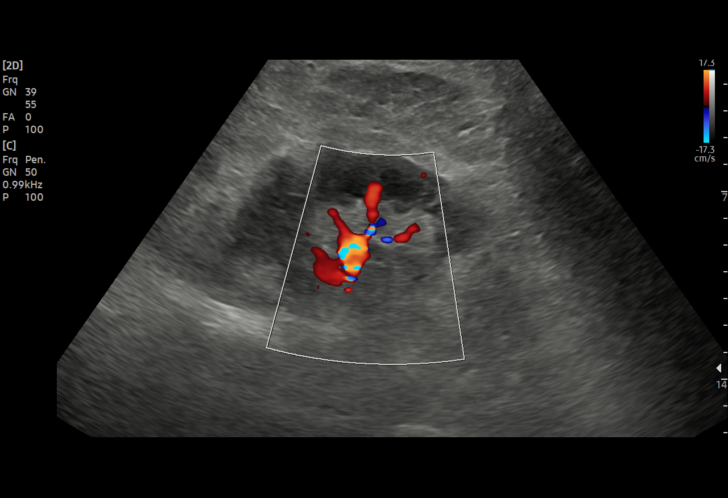
[im 51/82]
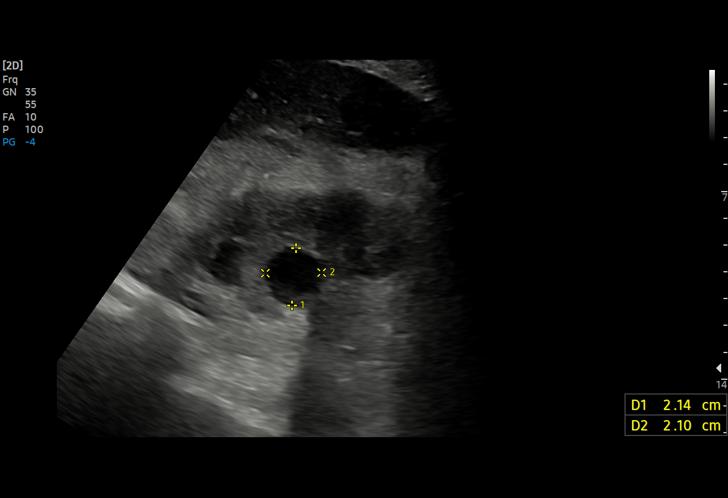
[im 58/82]
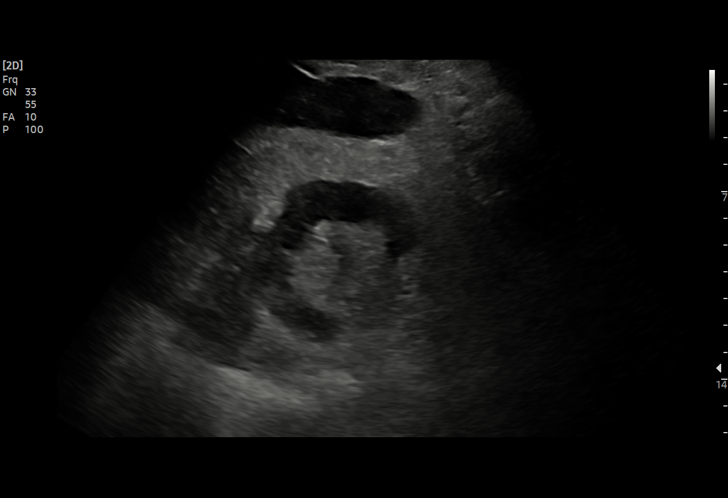
[im 65/82]
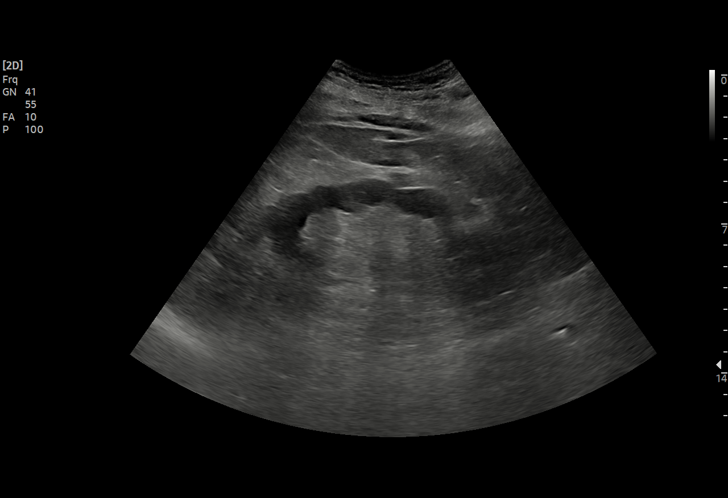
[im 68/82]
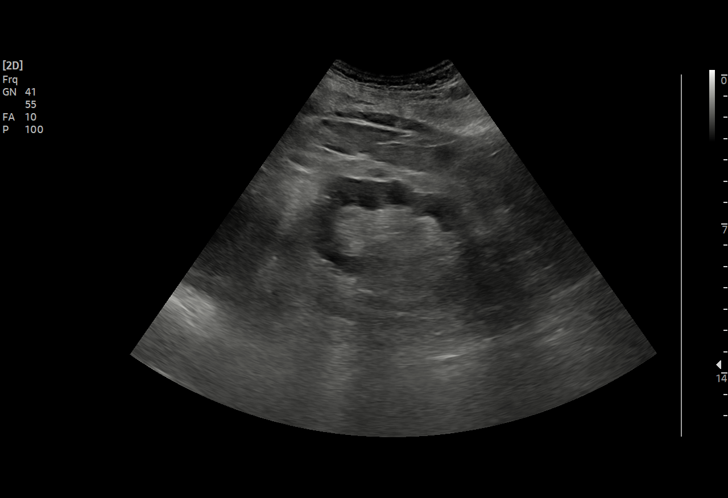
[im 75/82]
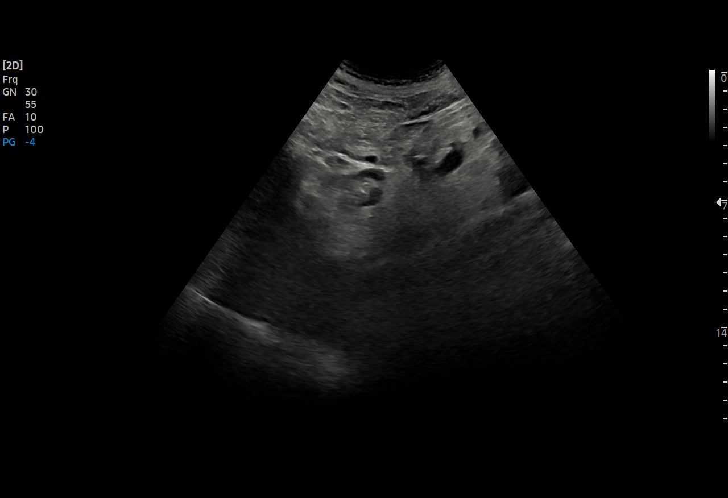
[im 82/82]
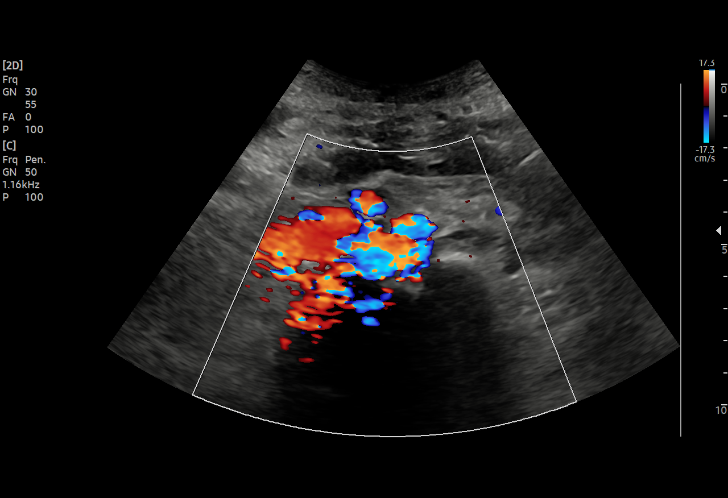

[15 of 25 positions shown; findings below may reference images not displayed]

FINDINGS: Gallbladder: The gallbladder wall appears mildly thickened measuring
4 mm. No gallstones, pericholecystic fluid or sonographic Murphy's
sign.

Common bile duct: Diameter: 4 mm

Liver: The liver has a coarsened echotexture. Contour of the liver
appears irregular and nodular. Portal vein is patent on color
Doppler imaging with normal direction of blood flow towards the
liver.

IVC: Diminished exam detail.  No abnormality visualized.

Pancreas: Diminished exam detail.  Visualized portion unremarkable.

Spleen: Size and appearance within normal limits.

Right Kidney: Length: 10 cm. Within the interpolar portions of the
right kidney there is a cyst measuring 2.1 x 2.1 x 2.0 cm.
Echogenicity within normal limits. No mass or hydronephrosis
visualized.

Left Kidney: Length: 9.4 cm. Echogenicity within normal limits. No
mass or hydronephrosis visualized.

Abdominal aorta: No aneurysm visualized.

Other findings: Markedly diminished exam detail secondary to patient
body habitus and overlying bowel gas.
IMPRESSION: 1. Markedly diminished exam detail secondary to patient body habitus
and overlying bowel gas.
2. Morphologic features of the liver suggestive of cirrhosis.
3. Gallbladder wall thickening. No gallstones, pericholecystic fluid
or sonographic Murphy's sign noted.

## 2023-12-07 IMAGING — DX DG CHEST 2V
3 series · 3 of 3 positions shown · non-contrast
Comparison: April 04, 2021.

CLINICAL DATA: Dysphagia.

EXAM:
CHEST - 2 VIEW

[chest lat (1 of 2)]
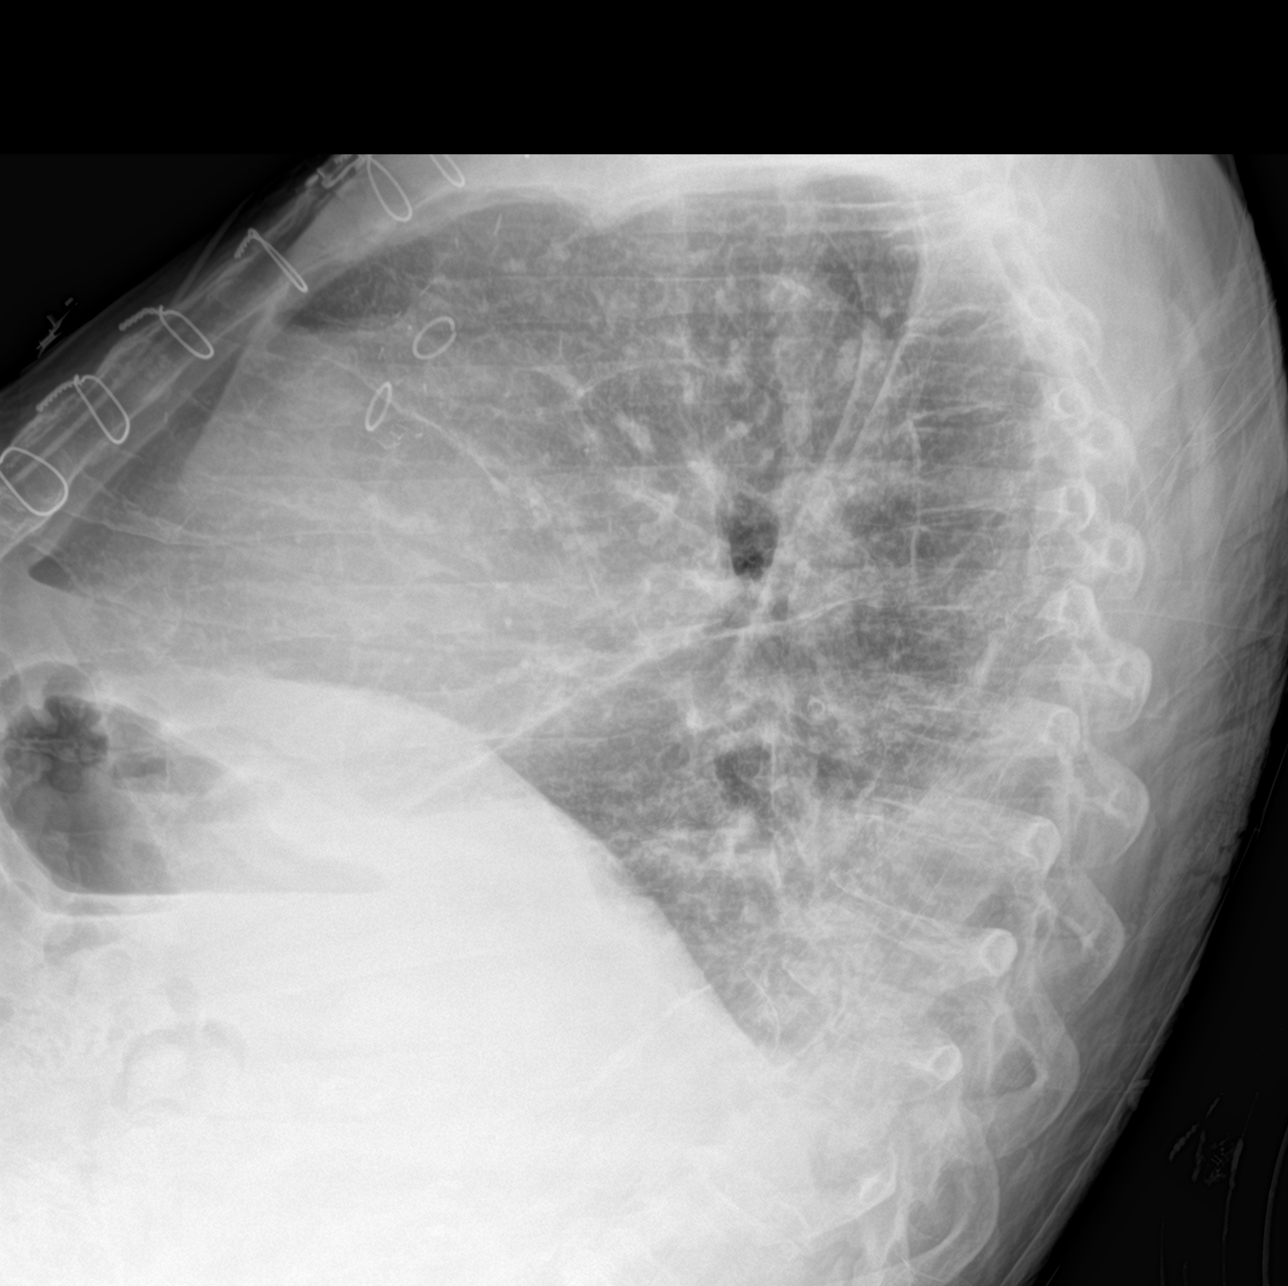

[chest ap]
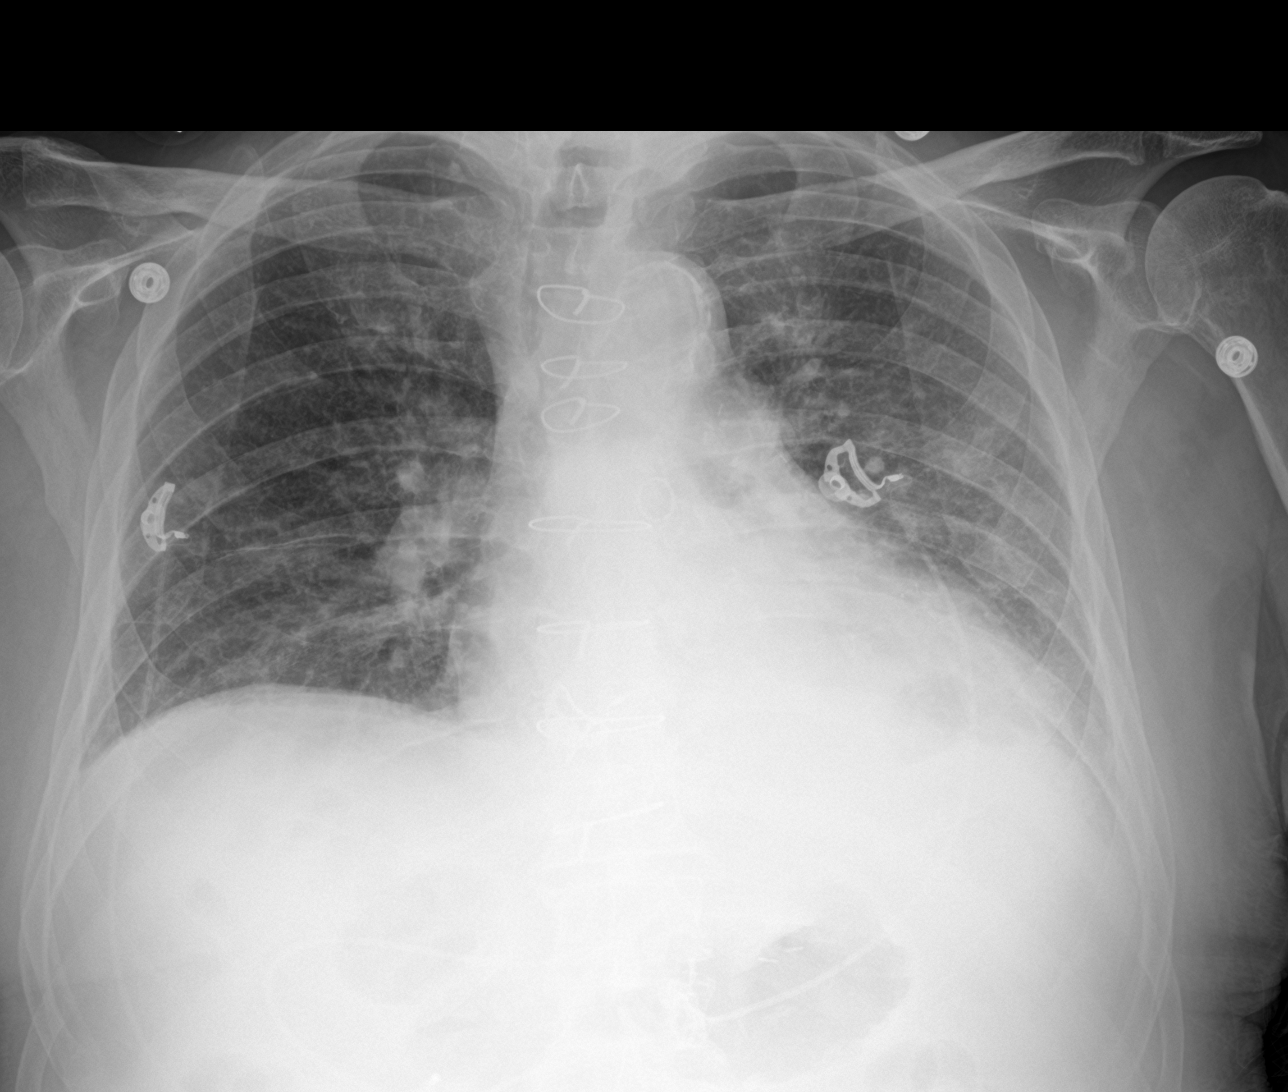

[chest lat (2 of 2)]
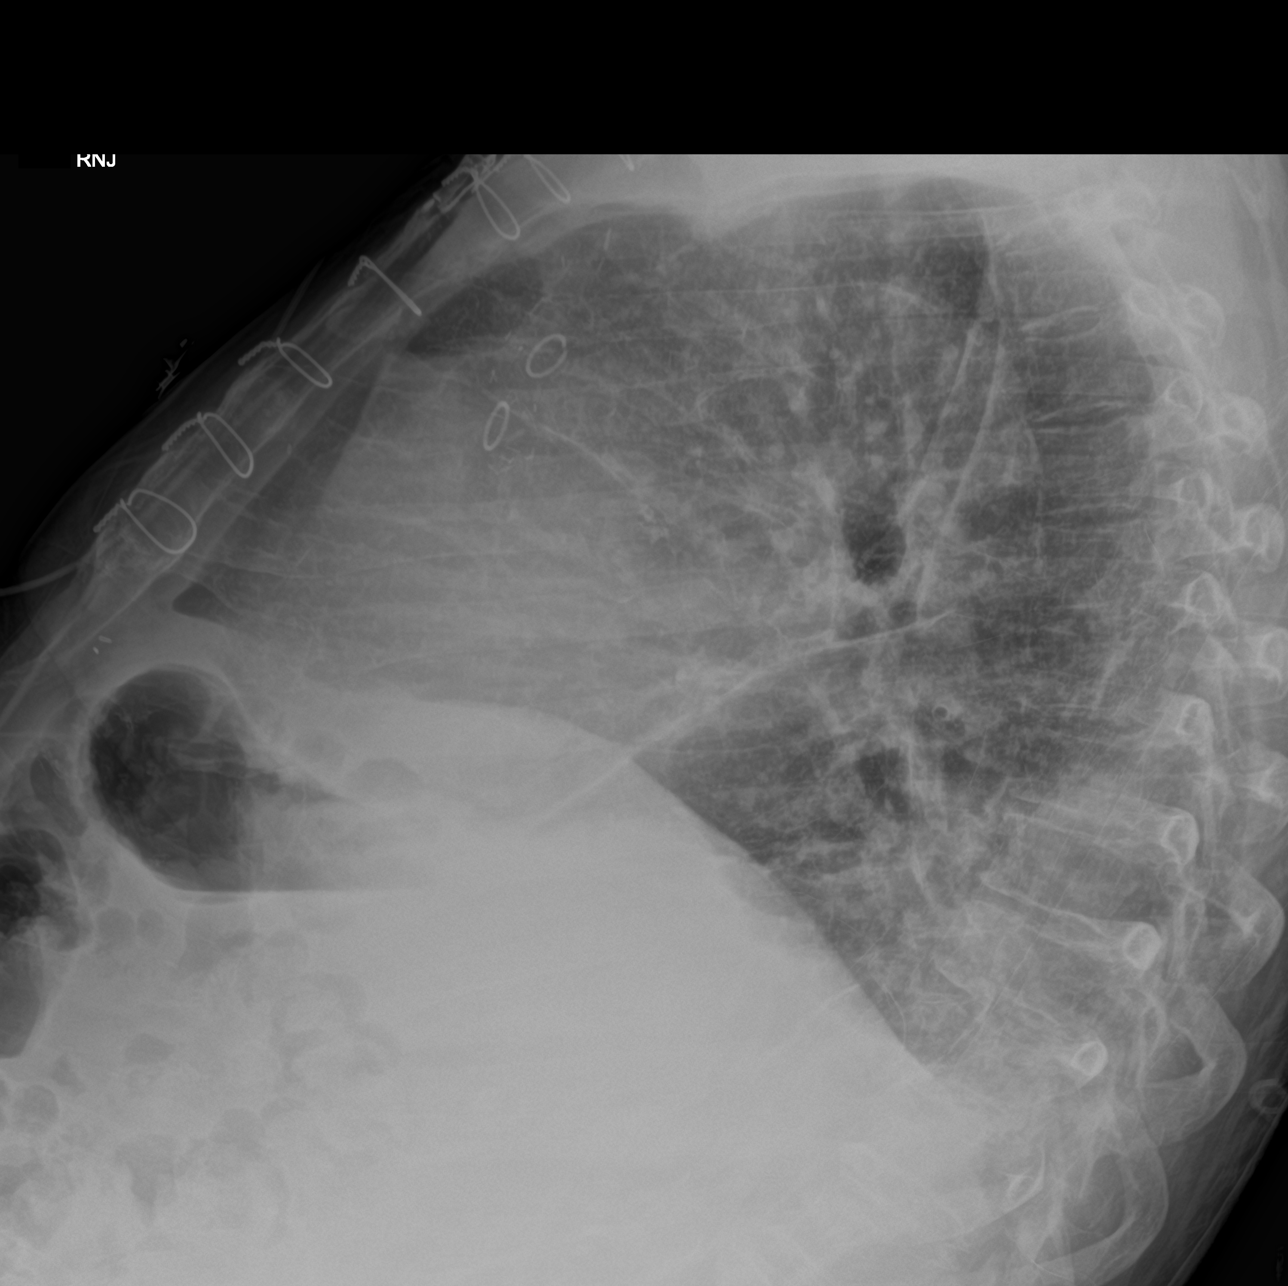

[3 of 3 positions shown; findings below may reference images not displayed]

FINDINGS: Stable cardiomegaly. Status post coronary bypass graft. Increased
bibasilar atelectasis or edema is noted. Bony thorax is
unremarkable.
IMPRESSION: Increased bibasilar atelectasis or edema is noted.

## 2023-12-08 DIAGNOSIS — M5416 Radiculopathy, lumbar region: Secondary | ICD-10-CM | POA: Diagnosis not present

## 2023-12-17 ENCOUNTER — Telehealth: Payer: Self-pay | Admitting: Cardiology

## 2023-12-17 NOTE — Telephone Encounter (Signed)
 RX sent in 08/2023 for a year supply. Called Pharmacy to check and Pharmacy confirmed that they did receive the RX's. Called Pt and spoke to Pt's Daughter. Pt's Daughter verbalized understanding.

## 2023-12-17 NOTE — Telephone Encounter (Signed)
*  STAT* If patient is at the pharmacy, call can be transferred to refill team.   1. Which medications need to be refilled? (please list name of each medication and dose if known) levothyroxine  (SYNTHROID ) 25 MCG tablet ezetimibe  (ZETIA ) 10 MG tablet rosuvastatin  (CRESTOR ) 40 MG tablet     2. Which pharmacy/location (including street and city if local pharmacy) is medication to be sent to? [id number] - [place], [place] - [address] AT [facility] [address]    3. Do they need a 30 day or 90 day supply?  90 day supply

## 2023-12-24 NOTE — Progress Notes (Signed)
 [facility] [address] [place], [place] [address] [phone]  Date:  12/29/2023   Name:  Gregory [name]   DOB:  [birth year]   MRN:  [id number]  PCP:  Watt Gregory BROCKS, MD    Chief Complaint: Follow-up (Flu shot )   History of Present Illness:  Gregory [name] is a 89 y.o. very pleasant male patient who presents with the following:  Patient seen today for periodic follow-up, I saw him most recently in April.  He had complained of fatigue but otherwise was in his normal state of health Patient with history of CAD status post CABG 2003, A. 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  I also treat him with tramadol  for chronic back pain  He was seen in the ER in August with diverticulitis-he was treated and released to home He saw his cardiologist, Dr. Charlena Sor in April-he noted patient was stable, he is on a reduced dose of Eliquis  given age and renal function.  Dr. Sor is retiring and we plan to transition Gregory [name] to Dr. Kate  He did have an epidural steroid injection per [facility] recently that did help with his back- sometimes in August   He had some lab work when he was in the ER back in August, CMP, CBC-can update blood work today - Flu shot  Eliquis  2.5 twice daily Carvedilol  3.125 twice daily Zetia  Famotidine  Levothyroxine  25 Pantoprazole  Requip  Crestor  Tramadol   Discussed the use of AI scribe software for clinical note transcription with the patient, who gave verbal consent to proceed.  History of Present Illness Gregory [name] is an 89 year old male with chronic back pain and diverticulitis who presents for pain management and medication refill.  He experienced a flare-up of diverticulitis  in August, but his condition has been stable since then with no major changes in his health reported.  He has chronic back pain, described as constant, with pain located in his back above the injection sites, sometimes radiating to his hip and leg. He reports that he does not experience pain when sitting or lying down, but has pain with walking. In August, he received epidural steroid injections on both sides, which provided some relief. He is currently taking tramadol  as needed for pain, which he finds helpful, especially when anticipating increased activity, and gabapentin at night. He is cautious with medication use due to concerns about constipation, for which he uses prunes and Miralax  as needed.  In terms of social history, he remains active, working on projects such as Ship [employer]. He also cooks at his mother's house, although he notes difficulty with cooking steak to his liking.    Patient Active Problem List   Diagnosis Date Noted   Fever 04/17/2021   Protein-calorie malnutrition, severe 04/15/2021   Pressure injury of sacral region, stage 1 04/14/2021   Acute on chronic systolic CHF (congestive heart failure) (HCC) 04/13/2021   Chronic kidney disease, stage 3a (HCC) 04/13/2021   Chronic diastolic CHF (congestive heart failure) (HCC) 04/06/2021   Essential hypertension 04/06/2021   Mixed hyperlipidemia 04/06/2021   Elevated liver enzymes 04/06/2021   Anemia, unspecified 04/06/2021   Hypothyroidism 04/06/2021   Restless leg syndrome 04/06/2021   Failure to thrive in adult 04/06/2021   Other cirrhosis of liver (HCC)  04/06/2021   GERD without esophagitis 01/26/2019   Hypothyroid 12/12/2018   Prediabetes 12/04/2018   Chronic anemia 01/13/2016   Chronic renal failure, stage 3 (moderate) (HCC) 08/06/2015   Vasovagal syncope    Renal infarction 07/31/2015   Syncope 07/30/2015   Esophageal dysphagia    Carotid stenosis 07/13/2013   Anticoagulated on  Eliquis  03/21/2013   PVD - 99% LICA, 79% RICA by doppler 5/14 01/24/2013   Thrombus of left atrial appendage on TEE 01/23/13 01/24/2013   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 03/31/2011   Atrial fibrillation, chronic (HCC) 03/31/2011   Cardiomyopathy, ischemic, improved 03/31/2011   CVA (cerebral vascular accident) (HCC) 03/31/2011   Renal calculi 03/31/2011   History of tobacco use, continues with chewing tobacco 03/31/2011    Past Medical History:  Diagnosis Date   Acute respiratory failure (HCC)    Appendicitis with abscess 03/31/2011   Arthritis    CAD (coronary artery disease),CABG 1993-LIMA to the LAD, SVG to Om and SVG to PDA/PLA, patent on cath 2008. 2003   a. s/p CABG 2003. b. last cath 2008 with patent grafts.   Chronic atrial fibrillation (HCC)    permanent   Chronic systolic CHF (congestive heart failure) (HCC)    a. Prior low EF, later normalized.   COVID 03/14/2021   Esophageal obstruction due to food impaction    Esophageal stricture    Essential hypertension    Food impaction of esophagus    GERD (gastroesophageal reflux disease)    Heart murmur    Hyperlipidemia    Ischemic cardiomyopathy    Kidney stones    passed all but one time when he had to have lithotripsy (01/21/2013)   Renal infarct    a. 07/2015 - after holding Xarelto  x 3 days for spinal procedure.   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 felt vasovagal in setting of pain from renal infarct.    Past Surgical History:  Procedure Laterality Date   BALLOON DILATION N/A 05/22/2015   Procedure: BALLOON DILATION;  Surgeon: Lupita FORBES Commander, MD;  Location: [facility];  Service: Endoscopy;  Laterality: N/A;   BALLOON DILATION N/A 12/15/2018   Procedure: BALLOON  DILATION;  Surgeon: Commander Lupita FORBES, MD;  Location: [facility];  Service: Endoscopy;  Laterality: N/A;   BALLOON DILATION N/A 10/14/2021   Procedure: BALLOON DILATION;  Surgeon: Stacia Glendia FORBES, MD;  Location: [facility];  Service: Gastroenterology;  Laterality: N/A;   BALLOON DILATION N/A 10/24/2021   Procedure: BALLOON DILATION;  Surgeon: Commander Lupita FORBES, MD;  Location: [facility];  Service: Gastroenterology;  Laterality: N/A;   CARDIAC CATHETERIZATION  02/24/2002   reduced LV function, 60-70% prox RCA stenosis, 70% PLA ostial stenosis, 80% secondary branch of PLA stenosis - subsequent CABG (Dr. DOROTHA Schwalbe)   Carotid Doppler  06/2012   50-69% right bulb/prox ICA diameter reduction; 70-99% left bulb/prox ICA diameter reduction   CATARACT EXTRACTION W/ INTRAOCULAR LENS IMPLANT Bilateral 2013   COLONOSCOPY N/A 05/18/2016   Procedure: COLONOSCOPY;  Surgeon: Elspeth Deward Naval, MD;  Location: [facility] ENDOSCOPY;  Service: Gastroenterology;  Laterality: N/A;   CORONARY ANGIOPLASTY  07/05/2006   3 vessel CAD, patent LIMA to LAD, patentVG to OM, patent SVG to PDA & PLA,  mild MR, severe LV systolic dysfunction (Dr. FABIENE Hasten)   CORONARY ARTERY BYPASS GRAFT  03/01/2002   LIMA to LAD, reverse SVG to OM, reverse SVG to PDA of RCA, reverse SVG to PLA of RCA, ligation of LA appendage (Dr. Army)   ENTEROSCOPY N/A 05/20/2016   Procedure: ENTEROSCOPY;  Surgeon: Elspeth Deward Naval, MD;  Location: [facility] ENDOSCOPY;  Service: Gastroenterology;  Laterality: N/A;   ESOPHAGOGASTRODUODENOSCOPY  02/01/2012   Procedure: ESOPHAGOGASTRODUODENOSCOPY (EGD);  Surgeon: Belvie JONETTA Just, MD;  Location: [facility];  Service: Endoscopy;  Laterality: N/A;   ESOPHAGOGASTRODUODENOSCOPY N/A 05/22/2015   Procedure: ESOPHAGOGASTRODUODENOSCOPY (EGD);  Surgeon: Lupita FORBES Commander, MD;  Location: [facility];  Service: Endoscopy;  Laterality: N/A;   ESOPHAGOGASTRODUODENOSCOPY N/A 05/17/2016   Procedure: ESOPHAGOGASTRODUODENOSCOPY (EGD);   Surgeon: Renaye Sous, MD;  Location: [facility] ENDOSCOPY;  Service: Endoscopy;  Laterality: N/A;   ESOPHAGOGASTRODUODENOSCOPY N/A 09/17/2017   Procedure: ESOPHAGOGASTRODUODENOSCOPY (EGD);  Surgeon: Teressa Toribio SQUIBB, MD;  Location: [facility] ENDOSCOPY;  Service: Endoscopy;  Laterality: N/A;   ESOPHAGOGASTRODUODENOSCOPY Left 02/09/2020   Procedure: ESOPHAGOGASTRODUODENOSCOPY (EGD);  Surgeon: San Sandor GAILS, DO;  Location: [facility] ENDOSCOPY;  Service: Gastroenterology;  Laterality: Left;   ESOPHAGOGASTRODUODENOSCOPY (EGD) WITH PROPOFOL  N/A 12/15/2018   Procedure: ESOPHAGOGASTRODUODENOSCOPY (EGD) WITH PROPOFOL ;  Surgeon: Commander Lupita FORBES, MD;  Location: [facility];  Service: Endoscopy;  Laterality: N/A;   ESOPHAGOGASTRODUODENOSCOPY (EGD) WITH PROPOFOL  N/A 04/07/2021   Procedure: ESOPHAGOGASTRODUODENOSCOPY (EGD) WITH PROPOFOL ;  Surgeon: Teressa Toribio SQUIBB, MD;  Location: [facility];  Service: Endoscopy;  Laterality: N/A;   ESOPHAGOGASTRODUODENOSCOPY (EGD) WITH PROPOFOL  N/A 10/14/2021   Procedure: ESOPHAGOGASTRODUODENOSCOPY (EGD) WITH PROPOFOL ;  Surgeon: Stacia Glendia FORBES, MD;  Location: [facility];  Service: Gastroenterology;  Laterality: N/A;   ESOPHAGOGASTRODUODENOSCOPY (EGD) WITH PROPOFOL  N/A 10/24/2021   Procedure: ESOPHAGOGASTRODUODENOSCOPY (EGD) WITH PROPOFOL ;  Surgeon: Commander Lupita FORBES, MD;  Location: [facility];  Service: Gastroenterology;  Laterality: N/A;   FOREIGN BODY REMOVAL  09/17/2017   Procedure: FOREIGN BODY REMOVAL;  Surgeon: Teressa Toribio SQUIBB, MD;  Location: [facility] ENDOSCOPY;  Service: Endoscopy;;   FOREIGN BODY REMOVAL  12/15/2018   Procedure: FOREIGN BODY REMOVAL;  Surgeon: Commander Lupita FORBES, MD;  Location: [facility];  Service: Endoscopy;;   FOREIGN BODY REMOVAL  02/09/2020   Procedure: FOREIGN BODY REMOVAL;  Surgeon: San Sandor GAILS, DO;  Location: [facility] ENDOSCOPY;  Service: Gastroenterology;;   FOREIGN BODY REMOVAL  10/14/2021   Procedure: FOREIGN BODY REMOVAL;  Surgeon: Stacia Glendia FORBES, MD;   Location: [facility];  Service: Gastroenterology;;   GIVENS CAPSULE STUDY N/A 05/18/2016   Procedure: GIVENS CAPSULE STUDY;  Surgeon: Elspeth Deward Naval, MD;  Location: [facility] ENDOSCOPY;  Service: Gastroenterology;  Laterality: N/A;   LAPAROSCOPIC APPENDECTOMY  03/30/2011   Procedure: APPENDECTOMY LAPAROSCOPIC;  Surgeon: Debby LABOR. Cornett, MD;  Location: [facility] OR;  Service: General;  Laterality: N/A;   LEFT HEART CATHETERIZATION WITH CORONARY ANGIOGRAM N/A 01/24/2013   Procedure: LEFT HEART CATHETERIZATION WITH CORONARY ANGIOGRAM;  Surgeon: Dorn JINNY Lesches, MD;  Location: [facility] CATH LAB;  Service: Cardiovascular;  Laterality: N/A;  Right heart with grafts   LITHOTRIPSY     once (01/21/2013)   NM MYOCAR PERF WALL MOTION  05/2009   persantine myoview - fixed moderate perfusion defect in inferior wall & lateral segment of apex (poor non-transmural infarction), minimal anterolateral periinfarct reversible ischemia seen, abnormal study, defects similar to 2006 study   RIGHT/LEFT HEART CATH AND CORONARY ANGIOGRAPHY N/A 09/17/2017   Procedure: RIGHT/LEFT HEART CATH AND CORONARY ANGIOGRAPHY;  Surgeon: Swaziland, Peter M,  MD;  Location: [facility] INVASIVE CV LAB;  Service: Cardiovascular;  Laterality: N/A;   SPLENECTOMY, TOTAL  01/2013   TEE WITHOUT CARDIOVERSION N/A 01/23/2013   Procedure: TRANSESOPHAGEAL ECHOCARDIOGRAM (TEE);  Surgeon: Jerel Balding, MD;  Location: [facility] ENDOSCOPY;  Service: Cardiovascular;  Laterality: N/A;   TRANSTHORACIC ECHOCARDIOGRAM  06/23/2012   EF 40-45%, mild LVH; mild AV regurg; mild MV regurg; LV mod-severely dilated; RV mildly dilated; systolic pressure borderline increased; RA mod dilated    Social History   Tobacco Use   Smoking status: Former    Current packs/day: 0.00    Average packs/day: 2.0 packs/day for 20.0 years (40.0 ttl pk-yrs)    Types: Cigarettes    Start date: 04/08/1966    Quit date: 04/08/1986    Years since quitting: 37.7   Smokeless tobacco: Current    Types: Chew    Tobacco comments:    Encouraged cessation  Vaping Use   Vaping status: Never Used  Substance Use Topics   Alcohol use: No    Alcohol/week: 0.0 standard drinks of alcohol   Drug use: No    Family History  Problem Relation Age of Onset   Heart disease Father        rheumatic fever   Heart attack Brother 17   Stroke Mother    Stroke Brother 76   Stomach cancer Neg Hx    Rectal cancer Neg Hx    Prostate cancer Neg Hx    Pancreatic cancer Neg Hx    Ovarian cancer Neg Hx     Allergies  Allergen Reactions   Percocet [Oxycodone -Acetaminophen ] Itching and Nausea Only    Medication list has been reviewed and updated.  Current Outpatient Medications on File Prior to Visit  Medication Sig Dispense Refill   acetaminophen  (TYLENOL ) 500 MG tablet Take 500 mg by mouth every 6 (six) hours as needed for moderate pain.     apixaban  (ELIQUIS ) 2.5 MG TABS tablet TAKE 1 TABLET(2.5 MG) BY MOUTH TWICE DAILY 180 tablet 3   ezetimibe  (ZETIA ) 10 MG tablet Take 1 tablet (10 mg total) by mouth daily. 90 tablet 3   famotidine  (PEPCID ) 20 MG tablet Take 1 tablet (20 mg total) by mouth 2 (two) times daily. 90 tablet 3   gabapentin (NEURONTIN) 100 MG capsule Take 100 mg by mouth at bedtime as needed (nerve pain.).     levothyroxine  (SYNTHROID ) 25 MCG tablet Take 1 tablet (25 mcg total) by mouth daily before breakfast. 90 tablet 3   Melatonin 10 MG TABS Take 10 mg by mouth at bedtime as needed (sleep).     ondansetron  (ZOFRAN -ODT) 4 MG disintegrating tablet Take 1 tablet (4 mg total) by mouth every 8 (eight) hours as needed for nausea or vomiting. 20 tablet 0   pantoprazole  (PROTONIX ) 40 MG tablet TAKE 1 TABLET(40 MG) BY MOUTH TWICE DAILY 180 tablet 2   rOPINIRole  (REQUIP ) 0.25 MG tablet TAKE 1 TABLET(0.25 MG) BY MOUTH IN THE MORNING AND AT BEDTIME 180 tablet 0   rosuvastatin  (CRESTOR ) 40 MG tablet TAKE 1 TABLET(40 MG) BY MOUTH DAILY 90 tablet 3   tiZANidine  (ZANAFLEX ) 4 MG tablet TAKE 1 TABLET(4 MG) BY  MOUTH EVERY 8 HOURS AS NEEDED FOR MUSCLE SPASMS. DO NOT COMBINE WITH TRAMADOL  (Patient taking differently: Take 4 mg by mouth at bedtime as needed for muscle spasms.) 60 tablet 1   traMADol  (ULTRAM ) 50 MG tablet TAKE 1/2 TABLET BY MOUTH EVERY 8 HOURS AS NEEDED FOR PAIN 90 tablet 1   No  current facility-administered medications on file prior to visit.    Review of Systems:  As per HPI- otherwise negative.   Physical Examination: Vitals:   12/29/23 1305  BP: 136/72  Pulse: 76  SpO2: 93%   Vitals:   12/29/23 1305  Weight: 173 lb 12.8 oz (78.8 kg)  Height: 5' 6 (1.676 m)   Body mass index is 28.05 kg/m. Ideal Body Weight: Weight in (lb) to have BMI = 25: 154.6  GEN: no acute distress.  Elderly gentleman, looks well and accompanied today by his daughter Gregory [name] HEENT: Atraumatic, Normocephalic.  Ears and Nose: No external deformity. CV: RRR, No M/G/R. No JVD. No thrill. No extra heart sounds. PULM: CTA B, no wheezes, crackles, rhonchi. No retractions. No resp. distress. No accessory muscle use. ABD: S, NT, ND, +BS. No rebound. No HSM. EXTR: No c/c/e PSYCH: Normally interactive. Conversant.  He is walking a bit more slowly than previously.  He has some difficulty getting to his feet but is able to stand independently and walk with a cane.  He indicates his thoracolumbar region as the site of his chronic back pain Assessment and Plan: Acquired hypothyroidism - Plan: TSH  Persistent atrial fibrillation (HCC) - Plan: Basic metabolic panel with GFR, carvedilol  (COREG ) 3.125 MG tablet  Spinal stenosis of lumbosacral region - Plan: traMADol  (ULTRAM ) 50 MG tablet  Screening for diabetes mellitus - Plan: Hemoglobin A1c  Screening, lipid - Plan: Lipid panel  Need for influenza vaccination - Plan: Flu vaccine HIGH DOSE PF(Fluzone Trivalent)  Assessment & Plan Lumbosacral spinal stenosis with chronic pain Chronic back pain due to lumbosacral spinal stenosis, unrelieved by recent  epidural steroid injections. Pain occurs with walking, absent when sitting or lying down. Monitoring effects of injections before further interventions. - Refill tramadol  prescription. - Advise Miralax  or prunes for constipation.  Chronic right hip and leg pain, likely radicular Chronic right hip and leg pain, likely radicular, possibly spinal-related. Open to physical therapy and further imaging if needed. - Continue tramadol  as needed. - Consider physical therapy.  General Health Maintenance Due for influenza vaccination. Carvedilol  prescription low. Routine blood work planned. - Administer flu shot. - Refill carvedilol  prescription. - Obtain blood work.  Signed Gregory Schroeder, MD  Addendum 10/16, received labs as below.  Letter to patient Results for orders placed or performed in visit on 12/29/23  Basic metabolic panel with GFR   Collection Time: 12/29/23  1:27 PM  Result Value Ref Range   Sodium 143 135 - 145 mEq/L   Potassium 4.2 3.5 - 5.1 mEq/L   Chloride 107 96 - 112 mEq/L   CO2 28 19 - 32 mEq/L   Glucose, Bld 113 (H) 70 - 99 mg/dL   BUN 19 6 - 23 mg/dL   Creatinine, Ser 8.48 (H) 0.40 - 1.50 mg/dL   GFR 59.35 (L) >39.99 mL/min   Calcium  8.7 8.4 - 10.5 mg/dL  Hemoglobin J8r   Collection Time: 12/29/23  1:27 PM  Result Value Ref Range   Hgb A1c MFr Bld 6.3 4.6 - 6.5 %  Lipid panel   Collection Time: 12/29/23  1:27 PM  Result Value Ref Range   Cholesterol 97 0 - 200 mg/dL   Triglycerides 881.9 0.0 - 149.0 mg/dL   HDL 62.99 (L) >60.99 mg/dL   VLDL 76.3 0.0 - 59.9 mg/dL   LDL Cholesterol 37 0 - 99 mg/dL   Total CHOL/HDL Ratio 3    NonHDL 60.30   TSH   Collection Time: 12/29/23  1:27 PM  Result Value Ref Range   TSH 3.95 0.35 - 5.50 uIU/mL

## 2023-12-24 NOTE — Patient Instructions (Addendum)
 It was great to see you again today, I will be in touch with your lab work. Flu shot today  Ok to use tramadol  as needed for pain  Recommend COVID booster this fall Recommend 1 dose of RSV vaccine, 1 shingles series if not completed already  If all is well please see me in about 6 months

## 2023-12-29 ENCOUNTER — Ambulatory Visit: Admitting: Family Medicine

## 2023-12-29 ENCOUNTER — Encounter: Payer: Self-pay | Admitting: Family Medicine

## 2023-12-29 VITALS — BP 136/72 | HR 76 | Ht 66.0 in | Wt 173.8 lb

## 2023-12-29 DIAGNOSIS — Z23 Encounter for immunization: Secondary | ICD-10-CM

## 2023-12-29 DIAGNOSIS — Z131 Encounter for screening for diabetes mellitus: Secondary | ICD-10-CM | POA: Diagnosis not present

## 2023-12-29 DIAGNOSIS — I4819 Other persistent atrial fibrillation: Secondary | ICD-10-CM

## 2023-12-29 DIAGNOSIS — M4807 Spinal stenosis, lumbosacral region: Secondary | ICD-10-CM

## 2023-12-29 DIAGNOSIS — E039 Hypothyroidism, unspecified: Secondary | ICD-10-CM

## 2023-12-29 DIAGNOSIS — Z13220 Encounter for screening for lipoid disorders: Secondary | ICD-10-CM | POA: Diagnosis not present

## 2023-12-29 MED ORDER — TRAMADOL HCL 50 MG PO TABS: 50.0000 mg | ORAL_TABLET | Freq: Four times a day (QID) | ORAL | 1 refills | Status: AC | PRN

## 2023-12-29 MED ORDER — CARVEDILOL 3.125 MG PO TABS: ORAL_TABLET | ORAL | 2 refills | Status: DC

## 2023-12-30 LAB — BASIC METABOLIC PANEL WITH GFR
BUN: 19 mg/dL (ref 6–23)
CO2: 28 meq/L (ref 19–32)
Calcium: 8.7 mg/dL (ref 8.4–10.5)
Chloride: 107 meq/L (ref 96–112)
Creatinine, Ser: 1.51 mg/dL — ABNORMAL HIGH (ref 0.40–1.50)
GFR: 40.64 mL/min — ABNORMAL LOW (ref >60.00)
Glucose, Bld: 113 mg/dL — ABNORMAL HIGH (ref 70–99)
Potassium: 4.2 meq/L (ref 3.5–5.1)
Sodium: 143 meq/L (ref 135–145)

## 2023-12-30 LAB — LIPID PANEL
Cholesterol: 97 mg/dL (ref 0–200)
HDL: 37 mg/dL — ABNORMAL LOW (ref >39.00)
LDL Cholesterol: 37 mg/dL (ref 0–99)
NonHDL: 60.3
Total CHOL/HDL Ratio: 3
Triglycerides: 118 mg/dL (ref 0.0–149.0)
VLDL: 23.6 mg/dL (ref 0.0–40.0)

## 2023-12-30 LAB — HEMOGLOBIN A1C: Hgb A1c MFr Bld: 6.3 % (ref 4.6–6.5)

## 2023-12-30 LAB — TSH: TSH: 3.95 u[IU]/mL (ref 0.35–5.50)

## 2024-01-28 ENCOUNTER — Other Ambulatory Visit: Payer: Self-pay | Admitting: Family Medicine

## 2024-01-28 DIAGNOSIS — G2581 Restless legs syndrome: Secondary | ICD-10-CM

## 2024-02-13 NOTE — Progress Notes (Unsigned)
 Cardiology Office Note:    Date:  02/16/2024   ID:  IZYAN EZZELL, DOB Apr 23, 1932, MRN [id number]  PCP:  Watt Harlene BROCKS, MD  Cardiologist:  Debby Sor, MD (Inactive)  Electrophysiologist:  None   Referring MD: Watt Harlene BROCKS, MD   Chief Complaint  Patient presents with   Coronary Artery Disease    History of Present Illness:    Gregory [name] is a 89 y.o. male with a hx of CAD status post CABG in 2003 (LIMA-LAD, SVG-OM, sequential SVG to PDA and PLA), chronic combined heart failure, CVA, SSS, hypertension, permanent atrial fibrillation who presents for follow-up.  Previously followed with Dr. Sor.  Echocardiogram in 2008 showed EF 20 to 25%.  He declined ICD.  EF improved to 35 to 40% in 2012 on medical therapy.  He had refused anticoagulation for his atrial fibrillation.  He was admitted in 2014 with splenic infarct, underwent TEE which showed left atrial appendage thrombus and EF severely reduced.  He was agreeable to anticoagulation at that time.  EF had normalized on echo in 2017 but subsequently in 2019 was found LVEF 25 to 30%.  LHC 09/2017 showed patent LIMA-LAD, SVG-OM, sequential SVG-PDA/PLA.  Echocardiogram 02/2018 showed EF 40 to 45%.  Echocardiogram 04/14/2021 showed EF 40 to 45%, normal RV function, severe left atrial enlargement, mild mitral regurgitation, mild aortic regurgitation.  Since last clinic visit, he reports he is doing well.  Denies any chest pain or dyspnea.  He will work in his yard for exercise.  Denies any exertional symptoms.  Denies any  lightheadedness, syncope, lower extremity edema, or palpitations.  He is taking Eliquis , denies any bleeding issues.  Past Medical History:  Diagnosis Date   Acute respiratory failure (HCC)    Appendicitis with abscess 03/31/2011   Arthritis    CAD (coronary artery disease),CABG 1993-LIMA to the LAD, SVG to Om and SVG to PDA/PLA, patent on cath 2008. 2003   a. s/p CABG 2003. b. last cath 2008 with patent  grafts.   Chronic atrial fibrillation (HCC)    permanent   Chronic systolic CHF (congestive heart failure) (HCC)    a. Prior low EF, later normalized.   COVID 03/14/2021   Esophageal obstruction due to food impaction    Esophageal stricture    Essential hypertension    Food impaction of esophagus    GERD (gastroesophageal reflux disease)    Heart murmur    Hyperlipidemia    Ischemic cardiomyopathy    Kidney stones    passed all but one time when he had to have lithotripsy (01/21/2013)   Renal infarct    a. 07/2015 - after holding Xarelto  x 3 days for spinal procedure.   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 felt vasovagal in setting of pain from renal infarct.    Past Surgical History:  Procedure Laterality Date   BALLOON DILATION N/A 05/22/2015   Procedure: BALLOON DILATION;  Surgeon: Lupita FORBES Commander, MD;  Location: [facility];  Service: Endoscopy;  Laterality: N/A;   BALLOON DILATION N/A 12/15/2018   Procedure: BALLOON DILATION;  Surgeon: Commander Lupita FORBES, MD;  Location: [facility];  Service: Endoscopy;  Laterality: N/A;   BALLOON DILATION N/A 10/14/2021   Procedure: BALLOON DILATION;  Surgeon: Stacia Glendia FORBES, MD;  Location: [facility];  Service: Gastroenterology;  Laterality: N/A;  BALLOON DILATION N/A 10/24/2021   Procedure: BALLOON DILATION;  Surgeon: Avram Lupita BRAVO, MD;  Location: [facility];  Service: Gastroenterology;  Laterality: N/A;   CARDIAC CATHETERIZATION  02/24/2002   reduced LV function, 60-70% prox RCA stenosis, 70% PLA ostial stenosis, 80% secondary branch of PLA stenosis - subsequent CABG (Dr. DOROTHA Schwalbe)   Carotid Doppler  06/2012   50-69% right bulb/prox ICA diameter reduction; 70-99% left bulb/prox ICA diameter reduction   CATARACT EXTRACTION W/ INTRAOCULAR LENS IMPLANT Bilateral 2013   COLONOSCOPY N/A 05/18/2016    Procedure: COLONOSCOPY;  Surgeon: Elspeth Deward Naval, MD;  Location: [facility]COPY;  Service: Gastroenterology;  Laterality: N/A;   CORONARY ANGIOPLASTY  07/05/2006   3 vessel CAD, patent LIMA to LAD, patentVG to OM, patent SVG to PDA & PLA, mild MR, severe LV systolic dysfunction (Dr. FABIENE Hasten)   CORONARY ARTERY BYPASS GRAFT  03/01/2002   LIMA to LAD, reverse SVG to OM, reverse SVG to PDA of RCA, reverse SVG to PLA of RCA, ligation of LA appendage (Dr. Army)   ENTEROSCOPY N/A 05/20/2016   Procedure: ENTEROSCOPY;  Surgeon: Elspeth Deward Naval, MD;  Location: [facility] ENDOSCOPY;  Service: Gastroenterology;  Laterality: N/A;   ESOPHAGOGASTRODUODENOSCOPY  02/01/2012   Procedure: ESOPHAGOGASTRODUODENOSCOPY (EGD);  Surgeon: Belvie JONETTA Just, MD;  Location: [facility];  Service: Endoscopy;  Laterality: N/A;   ESOPHAGOGASTRODUODENOSCOPY N/A 05/22/2015   Procedure: ESOPHAGOGASTRODUODENOSCOPY (EGD);  Surgeon: Lupita BRAVO Avram, MD;  Location: [facility];  Service: Endoscopy;  Laterality: N/A;   ESOPHAGOGASTRODUODENOSCOPY N/A 05/17/2016   Procedure: ESOPHAGOGASTRODUODENOSCOPY (EGD);  Surgeon: Renaye Sous, MD;  Location: [facility] ENDOSCOPY;  Service: Endoscopy;  Laterality: N/A;   ESOPHAGOGASTRODUODENOSCOPY N/A 09/17/2017   Procedure: ESOPHAGOGASTRODUODENOSCOPY (EGD);  Surgeon: Teressa Toribio SQUIBB, MD;  Location: [facility] ENDOSCOPY;  Service: Endoscopy;  Laterality: N/A;   ESOPHAGOGASTRODUODENOSCOPY Left 02/09/2020   Procedure: ESOPHAGOGASTRODUODENOSCOPY (EGD);  Surgeon: San Sandor GAILS, DO;  Location: [facility]OSCOPY;  Service: Gastroenterology;  Laterality: Left;   ESOPHAGOGASTRODUODENOSCOPY (EGD) WITH PROPOFOL  N/A 12/15/2018   Procedure: ESOPHAGOGASTRODUODENOSCOPY (EGD) WITH PROPOFOL ;  Surgeon: Avram Lupita BRAVO, MD;  Location: [facility];  Service: Endoscopy;  Laterality: N/A;   ESOPHAGOGASTRODUODENOSCOPY (EGD) WITH PROPOFOL  N/A 04/07/2021   Procedure: ESOPHAGOGASTRODUODENOSCOPY (EGD) WITH PROPOFOL ;  Surgeon: Teressa Toribio SQUIBB, MD;  Location: [facility];  Service: Endoscopy;  Laterality: N/A;   ESOPHAGOGASTRODUODENOSCOPY (EGD) WITH PROPOFOL  N/A 10/14/2021   Procedure: ESOPHAGOGASTRODUODENOSCOPY (EGD) WITH PROPOFOL ;  Surgeon: Stacia Glendia BRAVO, MD;  Location: [facility];  Service: Gastroenterology;  Laterality: N/A;   ESOPHAGOGASTRODUODENOSCOPY (EGD) WITH PROPOFOL  N/A 10/24/2021   Procedure: ESOPHAGOGASTRODUODENOSCOPY (EGD) WITH PROPOFOL ;  Surgeon: Avram Lupita BRAVO, MD;  Location: [facility];  Service: Gastroenterology;  Laterality: N/A;   FOREIGN BODY REMOVAL  09/17/2017   Procedure: FOREIGN BODY REMOVAL;  Surgeon: Teressa Toribio SQUIBB, MD;  Location: [facility] ENDOSCOPY;  Service: Endoscopy;;   FOREIGN BODY REMOVAL  12/15/2018   Procedure: FOREIGN BODY REMOVAL;  Surgeon: Avram Lupita BRAVO, MD;  Location: [facility];  Service: Endoscopy;;   FOREIGN BODY REMOVAL  02/09/2020   Procedure: FOREIGN BODY REMOVAL;  Surgeon: San Sandor GAILS, DO;  Location: [facility] ENDOSCOPY;  Service: Gastroenterology;;   FOREIGN BODY REMOVAL  10/14/2021   Procedure: FOREIGN BODY REMOVAL;  Surgeon: Stacia Glendia BRAVO, MD;  Location: [facility];  Service: Gastroenterology;;   GIVENS CAPSULE STUDY N/A 05/18/2016   Procedure: GIVENS CAPSULE STUDY;  Surgeon: Elspeth Deward Naval, MD;  Location: [facility] ENDOSCOPY;  Service: Gastroenterology;  Laterality: N/A;   LAPAROSCOPIC APPENDECTOMY  03/30/2011  Procedure: APPENDECTOMY LAPAROSCOPIC;  Surgeon: Debby LABOR. Cornett, MD;  Location: [facility] OR;  Service: General;  Laterality: N/A;   LEFT HEART CATHETERIZATION WITH CORONARY ANGIOGRAM N/A 01/24/2013   Procedure: LEFT HEART CATHETERIZATION WITH CORONARY ANGIOGRAM;  Surgeon: Dorn JINNY Lesches, MD;  Location: [facility] CATH LAB;  Service: Cardiovascular;  Laterality: N/A;  Right heart with grafts   LITHOTRIPSY     once (01/21/2013)   NM MYOCAR PERF WALL MOTION  05/2009   persantine myoview - fixed moderate perfusion defect in inferior wall & lateral segment of apex (poor  non-transmural infarction), minimal anterolateral periinfarct reversible ischemia seen, abnormal study, defects similar to 2006 study   RIGHT/LEFT HEART CATH AND CORONARY ANGIOGRAPHY N/A 09/17/2017   Procedure: RIGHT/LEFT HEART CATH AND CORONARY ANGIOGRAPHY;  Surgeon: Jordan, Peter M, MD;  Location: [facility] INVASIVE CV LAB;  Service: Cardiovascular;  Laterality: N/A;   SPLENECTOMY, TOTAL  01/2013   TEE WITHOUT CARDIOVERSION N/A 01/23/2013   Procedure: TRANSESOPHAGEAL ECHOCARDIOGRAM (TEE);  Surgeon: Jerel Balding, MD;  Location: [facility] ENDOSCOPY;  Service: Cardiovascular;  Laterality: N/A;   TRANSTHORACIC ECHOCARDIOGRAM  06/23/2012   EF 40-45%, mild LVH; mild AV regurg; mild MV regurg; LV mod-severely dilated; RV mildly dilated; systolic pressure borderline increased; RA mod dilated    Current Medications: Current Meds  Medication Sig   acetaminophen  (TYLENOL ) 500 MG tablet Take 500 mg by mouth every 6 (six) hours as needed for moderate pain.   famotidine  (PEPCID ) 20 MG tablet Take 1 tablet (20 mg total) by mouth 2 (two) times daily.   gabapentin (NEURONTIN) 100 MG capsule Take 100 mg by mouth at bedtime as needed (nerve pain.).   levothyroxine  (SYNTHROID ) 25 MCG tablet Take 1 tablet (25 mcg total) by mouth daily before breakfast.   Melatonin 10 MG TABS Take 10 mg by mouth at bedtime as needed (sleep).   pantoprazole  (PROTONIX ) 40 MG tablet TAKE 1 TABLET(40 MG) BY MOUTH TWICE DAILY   rOPINIRole  (REQUIP ) 0.25 MG tablet TAKE 1 TABLET(0.25 MG) BY MOUTH IN THE MORNING AND AT BEDTIME   tiZANidine  (ZANAFLEX ) 4 MG tablet TAKE 1 TABLET(4 MG) BY MOUTH EVERY 8 HOURS AS NEEDED FOR MUSCLE SPASMS. DO NOT COMBINE WITH TRAMADOL    traMADol  (ULTRAM ) 50 MG tablet TAKE 1/2 TABLET BY MOUTH EVERY 8 HOURS AS NEEDED FOR PAIN   traMADol  (ULTRAM ) 50 MG tablet Take 1 tablet (50 mg total) by mouth every 6 (six) hours as needed.   [DISCONTINUED] apixaban  (ELIQUIS ) 2.5 MG TABS tablet TAKE 1 TABLET(2.5 MG) BY MOUTH TWICE DAILY    [DISCONTINUED] carvedilol  (COREG ) 3.125 MG tablet TAKE 1 TABLET(3.125 MG) BY MOUTH TWICE DAILY WITH A MEAL   [DISCONTINUED] ezetimibe  (ZETIA ) 10 MG tablet Take 1 tablet (10 mg total) by mouth daily.   [DISCONTINUED] rosuvastatin  (CRESTOR ) 40 MG tablet TAKE 1 TABLET(40 MG) BY MOUTH DAILY     Allergies:   Percocet [oxycodone -acetaminophen ]   Social History   Socioeconomic History   Marital status: Married    Spouse name: Albino   Number of children: 9   Years of education: 8   Highest education level: Not on file  Occupational History   Occupation: retired  Tobacco Use   Smoking status: Former    Current packs/day: 0.00    Average packs/day: 2.0 packs/day for 20.0 years (40.0 ttl pk-yrs)    Types: Cigarettes    Start date: 04/08/1966    Quit date: 04/08/1986    Years since quitting: 37.8   Smokeless tobacco: Current    Types:  Chew   Tobacco comments:    Encouraged cessation  Vaping Use   Vaping status: Never Used  Substance and Sexual Activity   Alcohol use: No    Alcohol/week: 0.0 standard drinks of alcohol   Drug use: No   Sexual activity: Not on file  Other Topics Concern   Not on file  Social History Narrative    married, 5 sons 4 daughters. He is retired scientist, water quality. 2-3 caffeinated beverages a day no alcohol   Social Drivers of Corporate Investment Banker Strain: Low Risk  (04/15/2021)   Overall Financial Resource Strain (CARDIA)    Difficulty of Paying Living Expenses: Not hard at all  Food Insecurity: No Food Insecurity (04/29/2022)   Hunger Vital Sign    Worried About Running Out of Food in the Last Year: Never true    Ran Out of Food in the Last Year: Never true  Transportation Needs: No Transportation Needs (04/29/2022)   PRAPARE - Administrator, Civil Service (Medical): No    Lack of Transportation (Non-Medical): No  Physical Activity: Not on file  Stress: Not on file  Social Connections: Not on file     Family History: The patient's family  history includes Heart attack (age of onset: 1) in his brother; Heart disease in his father; Stroke in his mother; Stroke (age of onset: 71) in his brother. There is no history of Stomach cancer, Rectal cancer, Prostate cancer, Pancreatic cancer, or Ovarian cancer.  ROS:   Please see the history of present illness.     All other systems reviewed and are negative.  EKGs/Labs/Other Studies Reviewed:    The following studies were reviewed today:   EKG:   02/16/2024: Atrial fibrillation, rate 51, Low voltage, left axis deviation, nonspecific T wave flattening  Recent Labs: 10/22/2023: ALT 28; Hemoglobin 12.7; Platelets 141 12/29/2023: BUN 19; Creatinine, Ser 1.51; Potassium 4.2; Sodium 143; TSH 3.95  Recent Lipid Panel    Component Value Date/Time   CHOL 97 12/29/2023 1327   CHOL 136 05/18/2018 1355   TRIG 118.0 12/29/2023 1327   HDL 37.00 (L) 12/29/2023 1327   HDL 56 05/18/2018 1355   CHOLHDL 3 12/29/2023 1327   VLDL 23.6 12/29/2023 1327   LDLCALC 37 12/29/2023 1327   LDLCALC 67 11/22/2019 1320    Physical Exam:    VS:  BP 133/61 (BP Location: Left Arm, Patient Position: Sitting, Cuff Size: Normal)   Pulse (!) 56   Ht 5' 7.2 (1.707 m)   Wt 173 lb 6.4 oz (78.7 kg)   SpO2 96%   BMI 27.00 kg/m     Wt Readings from Last 3 Encounters:  02/16/24 173 lb 6.4 oz (78.7 kg)  12/29/23 173 lb 12.8 oz (78.8 kg)  07/07/23 180 lb 12.8 oz (82 kg)     GEN:  Well nourished, well developed in no acute distress HEENT: Normal NECK: No JVD; No carotid bruits LYMPHATICS: No lymphadenopathy CARDIAC: RRR, no murmurs, rubs, gallops RESPIRATORY:  Clear to auscultation without rales, wheezing or rhonchi  ABDOMEN: Soft, non-tender, non-distended MUSCULOSKELETAL:  No edema; No deformity  SKIN: Warm and dry NEUROLOGIC:  Alert and oriented x 3 PSYCHIATRIC:  Normal affect   ASSESSMENT:    1. Chronic systolic heart failure (HCC)   2. Coronary artery disease involving native coronary artery of  native heart without angina pectoris   3. Permanent atrial fibrillation (HCC)   4. Anticoagulated on Eliquis    5. Hyperlipidemia with target low  density lipoprotein (LDL) cholesterol less than 50 mg/dL   6. Stage 3b chronic kidney disease (HCC)    PLAN:    CAD: Status post CABG in 2003 (LIMA-LAD, SVG-OM, sequential SVG to PDA and PLA).  LHC 09/2017 showed patent LIMA-LAD, SVG-OM, sequential SVG-PDA/PLA. - Continue Eliquis , statin  Chronic systolic heart failure: EF as low as 20 to 25% in 2008.  He declined ICD at that time.  Ischemic cardiomyopathy.  Most recent echocardiogram 04/14/2021 showed EF 40 to 45%, normal RV function, severe left atrial enlargement, mild mitral regurgitation, mild aortic regurgitation. - Continue carvedilol  3.125 mg twice daily - Update echocardiogram  Carotid artery disease: Was following with [facility], had known high-grade carotid artery disease but declined surgery or stenting.  Continue Eliquis , statin  Permanent atrial fibrillation: CHA2DS2-VASc 8 (CHF, hypertension, age x 2, DM, CAD, CVA) - Continue Eliquis  2.5 mg twice daily.  Reduced dose given age and kidney function - Continue carvedilol  3.125 mg twice daily  CVA: In 2005.  Continue Eliquis , statin  Hyperlipidemia: On rosuvastatin  40 mg daily and Zetia  10 mg daily.  LDL 37 on 12/29/2023  CKD stage IIIb: Creatinine 1.5 on 12/29/2023  RTC in 6 months   Medication Adjustments/Labs and Tests Ordered: Current medicines are reviewed at length with the patient today.  Concerns regarding medicines are outlined above.  Orders Placed This Encounter  Procedures   EKG 12-Lead   ECHOCARDIOGRAM COMPLETE   Meds ordered this encounter  Medications   apixaban  (ELIQUIS ) 2.5 MG TABS tablet    Sig: TAKE 1 TABLET(2.5 MG) BY MOUTH TWICE DAILY    Dispense:  180 tablet    Refill:  3   carvedilol  (COREG ) 3.125 MG tablet    Sig: TAKE 1 TABLET(3.125 MG) BY MOUTH TWICE DAILY WITH A MEAL    Dispense:  180 tablet     Refill:  2   ezetimibe  (ZETIA ) 10 MG tablet    Sig: Take 1 tablet (10 mg total) by mouth daily.    Dispense:  90 tablet    Refill:  3   rosuvastatin  (CRESTOR ) 40 MG tablet    Sig: TAKE 1 TABLET(40 MG) BY MOUTH DAILY    Dispense:  90 tablet    Refill:  3    Patient Instructions  Medication Instructions:  Your physician recommends that you continue on your current medications as directed. Please refer to the Current Medication list given to you today.  *If you need a refill on your cardiac medications before your next appointment, please call your pharmacy*  Lab Work: none If you have labs (blood work) drawn today and your tests are completely normal, you will receive your results only by: MyChart Message (if you have MyChart) OR A paper copy in the mail If you have any lab test that is abnormal or we need to change your treatment, we will call you to review the results.  Testing/Procedures: Echo  Your physician has requested that you have an echocardiogram. Echocardiography is a painless test that uses sound waves to create images of your heart. It provides your doctor with information about the size and shape of your heart and how well your heart's chambers and valves are working. This procedure takes approximately one hour. There are no restrictions for this procedure. Please do NOT wear cologne, perfume, aftershave, or lotions (deodorant is allowed). Please arrive 15 minutes prior to your appointment time.  Please note: We ask at that you not bring children with you during ultrasound (echo/ vascular)  testing. Due to room size and safety concerns, children are not allowed in the ultrasound rooms during exams. Our front office staff cannot provide observation of children in our lobby area while testing is being conducted. An adult accompanying a patient to their appointment will only be allowed in the ultrasound room at the discretion of the ultrasound technician under special  circumstances. We apologize for any inconvenience.   Follow-Up: At [facility], you and your health needs are our priority.  As part of our continuing mission to provide you with exceptional heart care, our providers are all part of one team.  This team includes your primary Cardiologist (physician) and Advanced Practice Providers or APPs (Physician Assistants and Nurse Practitioners) who all work together to provide you with the care you need, when you need it.  Your next appointment:   6 months  Provider:   Dr. KATE   We recommend signing up for the patient portal called MyChart.  Sign up information is provided on this After Visit Summary.  MyChart is used to connect with patients for Virtual Visits (Telemedicine).  Patients are able to view lab/test results, encounter notes, upcoming appointments, etc.  Non-urgent messages can be sent to your provider as well.   To learn more about what you can do with MyChart, go to [email or web address].   Other Instructions NONE           Signed, Lonni LITTIE Kate, MD  02/16/2024 9:36 AM    [facility]

## 2024-02-16 ENCOUNTER — Ambulatory Visit: Attending: Cardiology | Admitting: Cardiology

## 2024-02-16 VITALS — BP 133/61 | HR 56 | Ht 67.2 in | Wt 173.4 lb

## 2024-02-16 DIAGNOSIS — I2510 Atherosclerotic heart disease of native coronary artery without angina pectoris: Secondary | ICD-10-CM

## 2024-02-16 DIAGNOSIS — E785 Hyperlipidemia, unspecified: Secondary | ICD-10-CM

## 2024-02-16 DIAGNOSIS — N1832 Chronic kidney disease, stage 3b: Secondary | ICD-10-CM

## 2024-02-16 DIAGNOSIS — I5022 Chronic systolic (congestive) heart failure: Secondary | ICD-10-CM

## 2024-02-16 DIAGNOSIS — Z7901 Long term (current) use of anticoagulants: Secondary | ICD-10-CM | POA: Diagnosis not present

## 2024-02-16 DIAGNOSIS — I4821 Permanent atrial fibrillation: Secondary | ICD-10-CM

## 2024-02-16 MED ORDER — EZETIMIBE 10 MG PO TABS: 10.0000 mg | ORAL_TABLET | Freq: Every day | ORAL | 3 refills | Status: AC

## 2024-02-16 MED ORDER — CARVEDILOL 3.125 MG PO TABS: ORAL_TABLET | ORAL | 2 refills | Status: AC

## 2024-02-16 MED ORDER — ROSUVASTATIN CALCIUM 40 MG PO TABS: ORAL_TABLET | ORAL | 3 refills | Status: AC

## 2024-02-16 MED ORDER — APIXABAN 2.5 MG PO TABS: ORAL_TABLET | ORAL | 3 refills | Status: AC

## 2024-02-16 NOTE — Patient Instructions (Signed)
 Medication Instructions:  Your physician recommends that you continue on your current medications as directed. Please refer to the Current Medication list given to you today.  *If you need a refill on your cardiac medications before your next appointment, please call your pharmacy*  Lab Work: none If you have labs (blood work) drawn today and your tests are completely normal, you will receive your results only by: MyChart Message (if you have MyChart) OR A paper copy in the mail If you have any lab test that is abnormal or we need to change your treatment, we will call you to review the results.  Testing/Procedures: Echo  Your physician has requested that you have an echocardiogram. Echocardiography is a painless test that uses sound waves to create images of your heart. It provides your doctor with information about the size and shape of your heart and how well your heart's chambers and valves are working. This procedure takes approximately one hour. There are no restrictions for this procedure. Please do NOT wear cologne, perfume, aftershave, or lotions (deodorant is allowed). Please arrive 15 minutes prior to your appointment time.  Please note: We ask at that you not bring children with you during ultrasound (echo/ vascular) testing. Due to room size and safety concerns, children are not allowed in the ultrasound rooms during exams. Our front office staff cannot provide observation of children in our lobby area while testing is being conducted. An adult accompanying a patient to their appointment will only be allowed in the ultrasound room at the discretion of the ultrasound technician under special circumstances. We apologize for any inconvenience.   Follow-Up: At [facility], you and your health needs are our priority.  As part of our continuing mission to provide you with exceptional heart care, our providers are all part of one team.  This team includes your primary  Cardiologist (physician) and Advanced Practice Providers or APPs (Physician Assistants and Nurse Practitioners) who all work together to provide you with the care you need, when you need it.  Your next appointment:   6 months  Provider:   Dr. KATE   We recommend signing up for the patient portal called MyChart.  Sign up information is provided on this After Visit Summary.  MyChart is used to connect with patients for Virtual Visits (Telemedicine).  Patients are able to view lab/test results, encounter notes, upcoming appointments, etc.  Non-urgent messages can be sent to your provider as well.   To learn more about what you can do with MyChart, go to [email or web address].   Other Instructions NONE

## 2024-02-17 ENCOUNTER — Other Ambulatory Visit: Payer: Self-pay | Admitting: Family Medicine

## 2024-03-13 ENCOUNTER — Telehealth: Payer: Self-pay

## 2024-03-13 NOTE — Telephone Encounter (Signed)
 Called back- it turns out his flu and covid was negative after all, they have looked at the test incorrectly Fever for one day- got sick about 48 hours ago but since then fever has not come back.  He has been resting and does not want to come out to the doctor.  His recently deceased wife's funeral is actually tomorrow.  Gregory [name] feels he is doing better than yesterday, we suspect he has a viral illness of some sort.  I encouraged him to wear a mask for the funeral tomorrow.  They will let me know if he is not continuing to improve

## 2024-03-13 NOTE — Telephone Encounter (Signed)
 Called and left message for Gregory [name] his daughter.  Would recommend test for flu and covid at home- if either + I can treat him via mychart.  We are also glad to find him an appt if they want to call -or urgent care would also be an option JC

## 2024-03-13 NOTE — Telephone Encounter (Signed)
 Copied from CRM #[id number]. Topic: Clinical - Medical Advice >> Mar 13, 2024  1:13 PM Gustabo D wrote: Montie pt daughter says his pcp wanted her to give the pt a flu and covid test and he has the flu. >> Mar 13, 2024  1:15 PM Gustabo D wrote: Was told to call back with the results.

## 2024-03-13 NOTE — Telephone Encounter (Signed)
 Initial Comment Caller states her dd is running a fever of 101.9 and has a sore throat. Translation No Nurse Assessment Nurse: Debby, RN, Cheri Date/Time (Eastern Time): 03/11/2024 9:26:34 PM Confirm and document reason for call. If symptomatic, describe symptoms. ---Caller states patient has a fever and sore throat. States also has a cough. Temp earlier was 101.9; currently 98.4. No chest pain or difficulty breathing. Does the patient have any new or worsening symptoms? ---Yes Will a triage be completed? ---Yes Related visit to physician within the last 2 weeks? ---No Does the PT have any chronic conditions? (i.e. diabetes, asthma, this includes High risk factors for pregnancy, etc.) ---Yes List chronic conditions. ---CAD, CABG, Afib (Eliquis ), HTN, cholesterol, CHF, CVA Is this a behavioral health or substance abuse call? ---No Guidelines Guideline Title Affirmed Question Affirmed Notes Nurse Date/Time (Eastern Time) Sore Throat Diabetes mellitus or weak immune system (e.g., HIV positive, cancer chemo, splenectomy, organ transplant, chronic steroids) Debby, RN, Cheri 03/11/2024 9:30:25 PM PLEASE NOTE: All timestamps contained within this report are represented as Eastern Standard Time. CONFIDENTIALTY NOTICE: This fax transmission is intended only for the addressee. It contains information that is legally privileged, confidential or otherwise protected from use or disclosure. If you are not the intended recipient, you are strictly prohibited from reviewing, disclosing, copying using or disseminating any of this information or taking any action in reliance on or regarding this information. If you have received this fax in error, please notify us  immediately by telephone so that we can arrange for its return to us . Phone: [phone], Toll-Free: [phone], Fax: [phone] Gregory [name] 10-22-1932 Page: 1 of2 CallId: [id number] Disp. Time [name] Disposition Final  User 03/11/2024 9:36:35 PM See PCP within 24 Hours Yes Debby RN, Cheri Final Disposition 03/11/2024 9:36:35 PM See PCP within 24 Hours Yes Debby, RN, Magdalena Caller Disagree/Comply Comply Caller Understands Yes PreDisposition Home Care Care Advice Given Per Guideline SEE PCP WITHIN 24 HOURS: * IF OFFICE WILL BE CLOSED: You need to be seen within the next 24 hours. A clinic or an urgent care center is often a good source of care if your doctor's office is closed or you can't get an appointment. SORE THROAT: * Here are some simple things you can do to treat and reduce sore throat pain. * Sip warm chicken broth or apple juice. * Suck on hard candy or an over-the-counter throat lozenge. * Gargle with warm salt water four times a day. To make salt water, put 1/2 teaspoon of salt in 8 oz (240 ml) of warm water. * Avoid cigarette smoke. PAIN AND FEVER MEDICINES: * For pain or fever relief, take either acetaminophen  or ibuprofen . * They are over-the-counter (OTC) drugs that help treat both fever and pain. You can buy them at the drugstore. * Treat fevers above 101 F (38.3 C). The goal of fever therapy is to bring the fever down to a comfortable level. Remember that fever medicine usually lowers fever 2 to 3 F (1 to 1.5 C). * ACETAMINOPHEN  - REGULAR STRENGTH TYLENOL : Take 650 mg (two 325 mg pills) by mouth every 4 to 6 hours as needed. Each Regular Strength Tylenol  pill has 325 mg of acetaminophen . The most you should take is 10 pills a day (3,250 mg total). Note: In [place], the maximum is 12 pills a day (3,900 mg total). * IBUPROFEN  (SUCH AS MOTRIN , ADVIL ): Take 400 mg (two 200 mg pills) by mouth every 6 hours. The most you should take is 6 pills a day (1,200  mg total). * Before taking any medicine, read all the instructions on the package. SOFT DIET: * Eat a soft diet. * Cold drinks, popsicles, and milkshakes often work well. * Avoid citrus fruits and spicy foods. CALL BACK IF: * You become worse  CARE ADVICE given per Sore Throat (Adult) guideline. Referrals GO TO FACILITY UNDECIDED

## 2024-04-12 ENCOUNTER — Ambulatory Visit (HOSPITAL_COMMUNITY)
Admission: RE | Admit: 2024-04-12 | Discharge: 2024-04-12 | Disposition: A | Discharge status: Home / Self Care | Source: Ambulatory Visit | Attending: Cardiovascular Disease | Admitting: Cardiovascular Disease

## 2024-04-12 DIAGNOSIS — I5022 Chronic systolic (congestive) heart failure: Secondary | ICD-10-CM | POA: Insufficient documentation

## 2024-04-12 LAB — ECHOCARDIOGRAM COMPLETE
Area-P 1/2: 4.99 cm2
P 1/2 time: 463 ms
S' Lateral: 3.55 cm

## 2024-04-13 ENCOUNTER — Ambulatory Visit: Payer: Self-pay | Admitting: Cardiology

## 2024-04-13 DIAGNOSIS — I350 Nonrheumatic aortic (valve) stenosis: Secondary | ICD-10-CM

## 2024-04-19 ENCOUNTER — Telehealth: Payer: Self-pay | Admitting: *Deleted

## 2024-04-19 NOTE — Telephone Encounter (Signed)
 Pt due for Medicare AWV.  We do not have any openings the same day as his OV on 06/28/24.  Advised pt's daughter we have to make these mychart video or in person. She states they will schedule the AWV after he sees Dr Watt on 06/28/24 and try to co-ordinate with his next OV with Dr Watt.

## 2024-04-21 ENCOUNTER — Other Ambulatory Visit: Payer: Self-pay | Admitting: Family Medicine

## 2024-04-21 DIAGNOSIS — G2581 Restless legs syndrome: Secondary | ICD-10-CM

## 2024-06-28 ENCOUNTER — Ambulatory Visit: Admitting: Family Medicine
